# Patient Record
Sex: Female | Born: 1985 | Race: Black or African American | Hispanic: No | Marital: Single | Smoking: Never smoker
Health system: Southern US, Community
[De-identification: ages and names within clinical notes are randomized; demographics above are authoritative.]

## PROBLEM LIST (undated history)

## (undated) ENCOUNTER — Inpatient Hospital Stay (HOSPITAL_COMMUNITY): Payer: Self-pay

## (undated) DIAGNOSIS — F419 Anxiety disorder, unspecified: Secondary | ICD-10-CM

## (undated) DIAGNOSIS — L732 Hidradenitis suppurativa: Secondary | ICD-10-CM

## (undated) DIAGNOSIS — K5010 Crohn's disease of large intestine without complications: Secondary | ICD-10-CM

## (undated) DIAGNOSIS — I10 Essential (primary) hypertension: Secondary | ICD-10-CM

## (undated) DIAGNOSIS — F32A Depression, unspecified: Secondary | ICD-10-CM

## (undated) DIAGNOSIS — Z9119 Patient's noncompliance with other medical treatment and regimen: Secondary | ICD-10-CM

## (undated) DIAGNOSIS — T7840XA Allergy, unspecified, initial encounter: Secondary | ICD-10-CM

## (undated) DIAGNOSIS — G43909 Migraine, unspecified, not intractable, without status migrainosus: Secondary | ICD-10-CM

## (undated) DIAGNOSIS — Z91199 Patient's noncompliance with other medical treatment and regimen due to unspecified reason: Secondary | ICD-10-CM

## (undated) HISTORY — DX: Allergy, unspecified, initial encounter: T78.40XA

## (undated) HISTORY — DX: Anxiety disorder, unspecified: F41.9

## (undated) HISTORY — PX: INDUCED ABORTION: SHX677

## (undated) HISTORY — DX: Depression, unspecified: F32.A

## (undated) HISTORY — PX: NO PAST SURGERIES: SHX2092

## (undated) SURGERY — Surgical Case
Anesthesia: *Unknown

---
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
## 2005-02-05 ENCOUNTER — Emergency Department (HOSPITAL_COMMUNITY): Admission: EM | Admit: 2005-02-05 | Discharge: 2005-02-05 | Payer: Self-pay | Admitting: Emergency Medicine

## 2006-01-09 ENCOUNTER — Ambulatory Visit (HOSPITAL_COMMUNITY): Admission: RE | Admit: 2006-01-09 | Discharge: 2006-01-09 | Payer: Self-pay | Admitting: Family Medicine

## 2006-01-23 ENCOUNTER — Emergency Department (HOSPITAL_COMMUNITY): Admission: EM | Admit: 2006-01-23 | Discharge: 2006-01-23 | Payer: Self-pay | Admitting: Emergency Medicine

## 2006-03-14 ENCOUNTER — Inpatient Hospital Stay (HOSPITAL_COMMUNITY): Admission: EM | Admit: 2006-03-14 | Discharge: 2006-03-15 | Payer: Self-pay | Admitting: Family Medicine

## 2006-03-14 ENCOUNTER — Encounter: Payer: Self-pay | Admitting: Emergency Medicine

## 2006-03-14 ENCOUNTER — Ambulatory Visit: Payer: Self-pay | Admitting: Family Medicine

## 2006-04-05 ENCOUNTER — Emergency Department (HOSPITAL_COMMUNITY): Admission: EM | Admit: 2006-04-05 | Discharge: 2006-04-05 | Payer: Self-pay | Admitting: Emergency Medicine

## 2007-04-22 ENCOUNTER — Emergency Department (HOSPITAL_COMMUNITY): Admission: EM | Admit: 2007-04-22 | Discharge: 2007-04-22 | Payer: Self-pay | Admitting: Emergency Medicine

## 2007-05-04 ENCOUNTER — Emergency Department (HOSPITAL_COMMUNITY): Admission: EM | Admit: 2007-05-04 | Discharge: 2007-05-04 | Payer: Self-pay | Admitting: *Deleted

## 2007-07-14 ENCOUNTER — Encounter: Admission: RE | Admit: 2007-07-14 | Discharge: 2007-07-14 | Payer: Self-pay | Admitting: *Deleted

## 2008-05-08 ENCOUNTER — Emergency Department (HOSPITAL_COMMUNITY): Admission: EM | Admit: 2008-05-08 | Discharge: 2008-05-08 | Payer: Self-pay | Admitting: Emergency Medicine

## 2008-11-08 ENCOUNTER — Emergency Department (HOSPITAL_COMMUNITY): Admission: EM | Admit: 2008-11-08 | Discharge: 2008-11-08 | Payer: Self-pay | Admitting: Emergency Medicine

## 2009-03-20 ENCOUNTER — Emergency Department (HOSPITAL_COMMUNITY): Admission: EM | Admit: 2009-03-20 | Discharge: 2009-03-20 | Payer: Self-pay | Admitting: Emergency Medicine

## 2009-04-14 ENCOUNTER — Emergency Department (HOSPITAL_COMMUNITY): Admission: EM | Admit: 2009-04-14 | Discharge: 2009-04-15 | Payer: Self-pay | Admitting: Emergency Medicine

## 2009-06-05 ENCOUNTER — Emergency Department (HOSPITAL_COMMUNITY): Admission: EM | Admit: 2009-06-05 | Discharge: 2009-06-05 | Payer: Self-pay | Admitting: Emergency Medicine

## 2010-07-25 ENCOUNTER — Emergency Department (HOSPITAL_COMMUNITY): Admission: EM | Admit: 2010-07-25 | Discharge: 2010-07-25 | Payer: Self-pay | Admitting: Emergency Medicine

## 2010-07-26 ENCOUNTER — Emergency Department (HOSPITAL_COMMUNITY): Admission: EM | Admit: 2010-07-26 | Discharge: 2010-07-26 | Payer: Self-pay | Admitting: Emergency Medicine

## 2010-10-08 ENCOUNTER — Emergency Department (HOSPITAL_COMMUNITY): Admission: EM | Admit: 2010-10-08 | Discharge: 2010-10-08 | Payer: Self-pay | Admitting: Emergency Medicine

## 2010-10-09 ENCOUNTER — Emergency Department (HOSPITAL_COMMUNITY): Admission: EM | Admit: 2010-10-09 | Discharge: 2010-10-09 | Payer: Self-pay | Admitting: Emergency Medicine

## 2011-01-17 ENCOUNTER — Emergency Department (HOSPITAL_COMMUNITY)
Admission: EM | Admit: 2011-01-17 | Discharge: 2011-01-17 | Disposition: A | Discharge status: Home / Self Care | Payer: Managed Care, Other (non HMO) | Attending: Emergency Medicine | Admitting: Emergency Medicine

## 2011-01-17 ENCOUNTER — Emergency Department (HOSPITAL_COMMUNITY): Payer: Managed Care, Other (non HMO)

## 2011-01-17 DIAGNOSIS — K5289 Other specified noninfective gastroenteritis and colitis: Secondary | ICD-10-CM | POA: Insufficient documentation

## 2011-01-17 DIAGNOSIS — R109 Unspecified abdominal pain: Secondary | ICD-10-CM | POA: Insufficient documentation

## 2011-01-17 DIAGNOSIS — R112 Nausea with vomiting, unspecified: Secondary | ICD-10-CM | POA: Insufficient documentation

## 2011-01-17 DIAGNOSIS — R110 Nausea: Secondary | ICD-10-CM | POA: Insufficient documentation

## 2011-01-17 DIAGNOSIS — R197 Diarrhea, unspecified: Secondary | ICD-10-CM | POA: Insufficient documentation

## 2011-01-17 DIAGNOSIS — D72829 Elevated white blood cell count, unspecified: Secondary | ICD-10-CM | POA: Insufficient documentation

## 2011-01-17 DIAGNOSIS — R10819 Abdominal tenderness, unspecified site: Secondary | ICD-10-CM | POA: Insufficient documentation

## 2011-01-17 LAB — DIFFERENTIAL
Basophils Absolute: 0 10*3/uL (ref 0.0–0.1)
Lymphocytes Relative: 9 % — ABNORMAL LOW (ref 12–46)
Lymphs Abs: 2 10*3/uL (ref 0.7–4.0)
Neutro Abs: 20.5 10*3/uL — ABNORMAL HIGH (ref 1.7–7.7)

## 2011-01-17 LAB — URINALYSIS, ROUTINE W REFLEX MICROSCOPIC
Hgb urine dipstick: NEGATIVE
Specific Gravity, Urine: 1.011 (ref 1.005–1.030)
Urine Glucose, Fasting: NEGATIVE mg/dL
Urobilinogen, UA: 0.2 mg/dL (ref 0.0–1.0)

## 2011-01-17 LAB — COMPREHENSIVE METABOLIC PANEL
ALT: 16 U/L (ref 0–35)
Alkaline Phosphatase: 68 U/L (ref 39–117)
CO2: 25 mEq/L (ref 19–32)
Chloride: 106 mEq/L (ref 96–112)
GFR calc non Af Amer: >60 mL/min (ref >60)
Glucose, Bld: 139 mg/dL — ABNORMAL HIGH (ref 70–99)
Potassium: 3.4 mEq/L — ABNORMAL LOW (ref 3.5–5.1)
Sodium: 143 mEq/L (ref 135–145)
Total Bilirubin: 0.7 mg/dL (ref 0.3–1.2)

## 2011-01-17 LAB — WET PREP, GENITAL

## 2011-01-17 LAB — CBC
HCT: 44.3 % (ref 36.0–46.0)
Hemoglobin: 14.7 g/dL (ref 12.0–15.0)
WBC: 24.1 10*3/uL — ABNORMAL HIGH (ref 4.0–10.5)

## 2011-01-17 LAB — LIPASE, BLOOD: Lipase: 27 U/L (ref 11–59)

## 2011-01-17 MED ORDER — IOHEXOL 300 MG/ML  SOLN
100.0000 mL | Freq: Once | INTRAMUSCULAR | Status: AC | PRN
  Administered 2011-01-17: 100 mL via INTRAVENOUS

## 2011-01-17 MED ORDER — IOHEXOL 300 MG/ML  SOLN: 100.0000 mL | Freq: Once | INTRAMUSCULAR | Status: DC | PRN

## 2011-01-18 LAB — GC/CHLAMYDIA PROBE AMP, GENITAL
Chlamydia, DNA Probe: NEGATIVE
GC Probe Amp, Genital: NEGATIVE

## 2011-02-11 LAB — URINE MICROSCOPIC-ADD ON

## 2011-02-11 LAB — CBC
HCT: 45 % (ref 36.0–46.0)
Hemoglobin: 15.1 g/dL — ABNORMAL HIGH (ref 12.0–15.0)
MCH: 27.5 pg (ref 26.0–34.0)
MCHC: 33.1 g/dL (ref 30.0–36.0)
MCHC: 33.6 g/dL (ref 30.0–36.0)
MCV: 82.3 fL (ref 78.0–100.0)
Platelets: 289 10*3/uL (ref 150–400)
RDW: 14.9 % (ref 11.5–15.5)

## 2011-02-11 LAB — COMPREHENSIVE METABOLIC PANEL
ALT: 14 U/L (ref 0–35)
AST: 27 U/L (ref 0–37)
Albumin: 4.6 g/dL (ref 3.5–5.2)
Calcium: 9.9 mg/dL (ref 8.4–10.5)
Creatinine, Ser: 0.87 mg/dL (ref 0.4–1.2)
GFR calc Af Amer: >60 mL/min (ref >60)
GFR calc non Af Amer: >60 mL/min (ref >60)
Sodium: 138 mEq/L (ref 135–145)
Total Protein: 9.1 g/dL — ABNORMAL HIGH (ref 6.0–8.3)

## 2011-02-11 LAB — DIFFERENTIAL
Basophils Relative: 0 % (ref 0–1)
Eosinophils Relative: 0 % (ref 0–5)
Eosinophils Relative: 0 % (ref 0–5)
Lymphocytes Relative: 7 % — ABNORMAL LOW (ref 12–46)
Lymphs Abs: 0.9 10*3/uL (ref 0.7–4.0)
Monocytes Absolute: 0.2 10*3/uL (ref 0.1–1.0)
Monocytes Absolute: 0.7 10*3/uL (ref 0.1–1.0)
Monocytes Relative: 2 % — ABNORMAL LOW (ref 3–12)
Monocytes Relative: 6 % (ref 3–12)
Neutro Abs: 9.8 10*3/uL — ABNORMAL HIGH (ref 1.7–7.7)

## 2011-02-11 LAB — URINALYSIS, ROUTINE W REFLEX MICROSCOPIC
Bilirubin Urine: NEGATIVE
Glucose, UA: NEGATIVE mg/dL
Leukocytes, UA: NEGATIVE
Protein, ur: 30 mg/dL — AB
Specific Gravity, Urine: 1.018 (ref 1.005–1.030)
Specific Gravity, Urine: 1.025 (ref 1.005–1.030)
Urobilinogen, UA: 0.2 mg/dL (ref 0.0–1.0)
pH: 6 (ref 5.0–8.0)

## 2011-02-11 LAB — POCT I-STAT, CHEM 8
HCT: 50 % — ABNORMAL HIGH (ref 36.0–46.0)
Hemoglobin: 17 g/dL — ABNORMAL HIGH (ref 12.0–15.0)
Potassium: 3.4 mEq/L — ABNORMAL LOW (ref 3.5–5.1)
Sodium: 137 mEq/L (ref 135–145)

## 2011-02-11 LAB — LIPASE, BLOOD: Lipase: 22 U/L (ref 11–59)

## 2011-02-11 LAB — POCT PREGNANCY, URINE: Preg Test, Ur: NEGATIVE

## 2011-02-14 LAB — DIFFERENTIAL
Basophils Relative: 0 % (ref 0–1)
Lymphocytes Relative: 5 % — ABNORMAL LOW (ref 12–46)
Lymphs Abs: 0.8 10*3/uL (ref 0.7–4.0)
Monocytes Absolute: 0.6 10*3/uL (ref 0.1–1.0)
Monocytes Relative: 3 % (ref 3–12)
Monocytes Relative: 4 % (ref 3–12)
Neutro Abs: 12.3 10*3/uL — ABNORMAL HIGH (ref 1.7–7.7)
Neutro Abs: 13.7 10*3/uL — ABNORMAL HIGH (ref 1.7–7.7)
Neutrophils Relative %: 92 % — ABNORMAL HIGH (ref 43–77)

## 2011-02-14 LAB — CBC
HCT: 48.1 % — ABNORMAL HIGH (ref 36.0–46.0)
Hemoglobin: 14.1 g/dL (ref 12.0–15.0)
Hemoglobin: 16.1 g/dL — ABNORMAL HIGH (ref 12.0–15.0)
MCHC: 33.5 g/dL (ref 30.0–36.0)
RBC: 5.02 MIL/uL (ref 3.87–5.11)

## 2011-02-14 LAB — URINALYSIS, ROUTINE W REFLEX MICROSCOPIC
Leukocytes, UA: NEGATIVE
Nitrite: NEGATIVE
Specific Gravity, Urine: 1.019 (ref 1.005–1.030)
Urobilinogen, UA: 0.2 mg/dL (ref 0.0–1.0)

## 2011-02-14 LAB — BASIC METABOLIC PANEL
CO2: 23 mEq/L (ref 19–32)
Calcium: 8.7 mg/dL (ref 8.4–10.5)
Chloride: 101 mEq/L (ref 96–112)
GFR calc Af Amer: >60 mL/min (ref >60)
GFR calc non Af Amer: >60 mL/min (ref >60)
GFR calc non Af Amer: >60 mL/min (ref >60)
Glucose, Bld: 84 mg/dL (ref 70–99)
Potassium: 3.2 mEq/L — ABNORMAL LOW (ref 3.5–5.1)
Potassium: 3.4 mEq/L — ABNORMAL LOW (ref 3.5–5.1)
Sodium: 136 mEq/L (ref 135–145)
Sodium: 139 mEq/L (ref 135–145)

## 2011-02-14 LAB — URINE MICROSCOPIC-ADD ON

## 2011-03-09 LAB — COMPREHENSIVE METABOLIC PANEL
CO2: 20 mEq/L (ref 19–32)
Calcium: 8.7 mg/dL (ref 8.4–10.5)
Creatinine, Ser: 0.57 mg/dL (ref 0.4–1.2)
GFR calc non Af Amer: >60 mL/min (ref >60)
Glucose, Bld: 106 mg/dL — ABNORMAL HIGH (ref 70–99)
Total Bilirubin: 0.8 mg/dL (ref 0.3–1.2)

## 2011-03-09 LAB — URINALYSIS, ROUTINE W REFLEX MICROSCOPIC
Bilirubin Urine: NEGATIVE
Hgb urine dipstick: NEGATIVE
Specific Gravity, Urine: 1.011 (ref 1.005–1.030)
Urobilinogen, UA: 0.2 mg/dL (ref 0.0–1.0)
pH: 7.5 (ref 5.0–8.0)

## 2011-03-09 LAB — CBC
HCT: 44.5 % (ref 36.0–46.0)
Hemoglobin: 14.9 g/dL (ref 12.0–15.0)
MCHC: 33.5 g/dL (ref 30.0–36.0)
MCV: 82 fL (ref 78.0–100.0)
RBC: 5.43 MIL/uL — ABNORMAL HIGH (ref 3.87–5.11)

## 2011-03-09 LAB — DIFFERENTIAL
Basophils Relative: 3 % — ABNORMAL HIGH (ref 0–1)
Eosinophils Relative: 0 % (ref 0–5)
Lymphs Abs: 1 10*3/uL (ref 0.7–4.0)
Monocytes Absolute: 0.5 10*3/uL (ref 0.1–1.0)
Neutro Abs: 14.7 10*3/uL — ABNORMAL HIGH (ref 1.7–7.7)

## 2011-03-09 LAB — PREGNANCY, URINE: Preg Test, Ur: NEGATIVE

## 2011-03-11 LAB — DIFFERENTIAL
Basophils Absolute: 0 10*3/uL (ref 0.0–0.1)
Basophils Relative: 0 % (ref 0–1)
Eosinophils Absolute: 0 10*3/uL (ref 0.0–0.7)
Eosinophils Relative: 0 % (ref 0–5)
Monocytes Absolute: 0.2 10*3/uL (ref 0.1–1.0)
Monocytes Relative: 1 % — ABNORMAL LOW (ref 3–12)
Neutro Abs: 16.2 10*3/uL — ABNORMAL HIGH (ref 1.7–7.7)

## 2011-03-11 LAB — BASIC METABOLIC PANEL
CO2: 22 mEq/L (ref 19–32)
Calcium: 9.4 mg/dL (ref 8.4–10.5)
Chloride: 104 mEq/L (ref 96–112)
Glucose, Bld: 92 mg/dL (ref 70–99)
Sodium: 137 mEq/L (ref 135–145)

## 2011-03-11 LAB — URINALYSIS, ROUTINE W REFLEX MICROSCOPIC
Bilirubin Urine: NEGATIVE
Nitrite: NEGATIVE
Protein, ur: NEGATIVE mg/dL
Specific Gravity, Urine: 1.018 (ref 1.005–1.030)
Urobilinogen, UA: 0.2 mg/dL (ref 0.0–1.0)

## 2011-03-11 LAB — CBC
HCT: 44.4 % (ref 36.0–46.0)
Hemoglobin: 14.6 g/dL (ref 12.0–15.0)
MCHC: 33 g/dL (ref 30.0–36.0)
MCV: 83 fL (ref 78.0–100.0)
RBC: 5.35 MIL/uL — ABNORMAL HIGH (ref 3.87–5.11)
RDW: 15.3 % (ref 11.5–15.5)

## 2011-03-11 LAB — URINE MICROSCOPIC-ADD ON

## 2011-03-12 LAB — POCT I-STAT, CHEM 8
Calcium, Ion: 0.97 mmol/L — ABNORMAL LOW (ref 1.12–1.32)
Glucose, Bld: 129 mg/dL — ABNORMAL HIGH (ref 70–99)
HCT: 50 % — ABNORMAL HIGH (ref 36.0–46.0)
Hemoglobin: 17 g/dL — ABNORMAL HIGH (ref 12.0–15.0)
TCO2: 21 mmol/L (ref 0–100)

## 2011-04-18 NOTE — H&P (Signed)
NAME:  AVALEEN, BROWNLEY              ACCOUNT NO.:  [id number]   MEDICAL RECORD NO.:  [id number]          PATIENT TYPE:  INP   LOCATION:  5511                         FACILITY:  [facility]   PHYSICIAN:  Clifton Custard, M.D.      DATE OF BIRTH:  [birth year]   DATE OF ADMISSION:  03/14/2006  DATE OF DISCHARGE:                                HISTORY & PHYSICAL   CHIEF COMPLAINT:  Nausea and vomiting.   HISTORY OF PRESENT ILLNESS:  The patient is a 25 year old African American  female with no significant past medical history who has had 4-5 days of  nausea and vomiting.  The vomiting has been non-bilious and non-bloody.  The  patient states she has been unable to keep any food or fluids down for the  past 4-5 days.  She denies any abdominal pain or diarrhea.  Her last bowel  movement was March 12, 2006.  She has noted subjective fevers and chills  during the past week, as well.  Of note, the patient underwent an abortion  on March 11, 2006.  The procedure was without complications.  She was  started on Doxycycline 100 mg p.o. once daily for prophylaxis.  The patient  was having symptoms of  nausea and vomiting prior to her abortion.  The  patient had some dysuria at the beginning of her illness, although she does  not have any dysuria now.  She denies any flank pain.  She denies any  vaginal discharge.  The patient does complain of some neck pain.  She  describes the pain as achy, on the back of her neck, and sometimes shooting.  She denies any upper extremity weakness or sensory changes.  She denies any  headache, vision changes, or mental status changes.  The patient has had  similar pains in her neck before in February of this year when she was sick  with an infectious colitis.   REVIEW OF SYSTEMS:  No chest pain, no shortness of breath, no palpitations.  Positive light headedness.  No mental status changes.  No rashes.  No  vaginal discharge.  The patient does note some esophageal burning  after  vomiting.  Patient also with a small amount of vaginal bleeding similar to  menses.  The remainder of the review of systems as per HPI.   PAST MEDICAL HISTORY:  Infectious colitis in February 2007.  She had a CT  scan which showed inflammation of the terminal ileum.  Colonoscopy was  performed which was negative for Crohn's.  The patient was treated with  Cipro and Flagyl.   MEDICATIONS:  No regular medications, although currently taking Doxycycline  100 mg once daily.   ALLERGIES:  CIPRO, FLAGYL, AND HYDROCODONE.  The patient developed a rash  and swelling while on all three of these medications for treatment of her  colitis.   FAMILY HISTORY:  Mother with type 2 diabetes.  No family  history of Crohn's  other than inflammatory bowel disease.   SOCIAL HISTORY:  The patient attends [employer].  She denies any alcohol  or drug use.  She  does smoke occasional cigarettes.   PHYSICAL EXAMINATION:  VITAL SIGNS:  Temperature 98.2, although temperature was recorded as 101 at  [facility], pulse 76, blood pressure 118/72, respiratory rate 16,  100% on room air.  GENERAL:  Alert in no acute distress.  HEENT:  Mucous membranes are dry.  Throat without erythema or exudate.  NECK:  No lymphadenopathy, no thyromegaly or thyroid nodules.  Neck with  full range of motion.  The patient is tender to palpation at the base of her  occiput bilaterally.  CARDIOVASCULAR:  Regular rate and rhythm.  LUNGS:  Clear to auscultation bilaterally.  ABDOMEN:  Positive bowel sounds, soft, nontender, nondistended, no  hepatosplenomegaly, no CVA tenderness, no rebound or guarding, no Murphy's  sign.  EXTREMITIES:  No cyanosis, clubbing, and edema.  NEUROLOGICAL:  Cranial nerves 2-12 are grossly intact, sensation intact to  light touch.  Patient with 5/5 upper extremity and lower extremity strength  bilaterally symmetric.  Patient with 2+ DTRs and bilaterally symmetric.  Finger-to-nose  intact.  Patient with negative Kernig's or Brudzinski sign.   LABORATORY DATA:  Sodium 135, potassium 2.6, chloride 101, bicarb 23, BUN 8,  creatinine 0.7, glucose 105.  Total bilirubin 1, alkaline phos 43, AST 23,  ALT 30, albumin 3.6, calcium 8.9.  White blood cell count 16.4, hemoglobin  14, hematocrit 42, platelet count 352, 78% neutrophils, 12.8 absolute  neutrophils.  Urinalysis with specific gravity 1.029, negative ketones,  large amount of blood, 30 protein, negative nitrate, trace leukocyte  esterase.  Urine microscopy with 0-2 white blood cells, 21-50 red blood  cells.  Chest x-ray showed no active cardiopulmonary disease.   ASSESSMENT/PLAN:  25 year old female with nausea, vomiting, and electrolyte  abnormalities.   1.  Nausea and vomiting.  Differential diagnosis includes viral      gastroenteritis, pregnancy, versus hyperthyroidism, or some kind of      inflammatory condition versus possible pelvic abscess as the patient      just had an abortion.  Urinalysis was negative for pyelonephritis,      thought less likely.  The patient did have a fever and elevated white      blood cell count.  We will continue to provide supportive care with IV      fluids, Protonix and Zofran.  We will check a TSH, urine pregnancy test,      ESR to rule out inflammatory process.  We will also do a bimanual exam      and check a pelvic ultrasound to rule out a pelvic abscess.  We will      also draw blood cultures.  We will hold off on starting antibiotics for      now.  2.  Hyperkalemia.  Her potassium was 2.6 likely secondary to vomiting and      dehydration.  We will replace potassium orally as well as IV if      necessary.  3.  Neck pain.  The patient's exam was consistent with a musculoskeletal      source of pain.  Patient with no headache, good neck range of motion, no      mental status changes, is not concerning for meningitis.  I will provide      symptomatic treatment with Toradol  and Ultram. 4.  Fluids and electrolytes.  The patient is dehydrated.  I will run normal      saline at 200 mL an hour.  I will also check a BMP in the  p.m. to follow      electrolytes.  Will advance the patient's diet as tolerated.      Clifton Custard, M.D.    ______________________________  Clifton Custard, M.D.    MR/MEDQ  D:  03/14/2006  T:  03/14/2006  Job:  [id number]   cc:   Bernerd Limbo, M.D.  Fax: [phone]

## 2011-04-18 NOTE — Discharge Summary (Signed)
NAME:  Frances Mccormick, Frances Mccormick              ACCOUNT NO.:  [id number]   MEDICAL RECORD NO.:  [id number]          PATIENT TYPE:  INP   LOCATION:  5511                         FACILITY:  [facility]   PHYSICIAN:  Randa Spike, MD  DATE OF BIRTH:  1986-10-30   DATE OF ADMISSION:  03/14/2006  DATE OF DISCHARGE:  03/15/2006                                 DISCHARGE SUMMARY   DISCHARGE DIAGNOSES:  1.  Nausea and vomiting, resolved.  2.  Dehydration, resolved.  3.  Gastritis.   DISCHARGE MEDICATIONS:  1.  Prilosec 40 mg p.o. b.i.d. for 7 days.  2.  Phenergan 12.5 mg q.6 h p.r.n. for nausea.  3.  Tylenol #3, 2 tabs q.4 h p.r.n. for pain.   DISCHARGE INSTRUCTIONS:  The patient was instructed to continue with a full  liquid diet and advance her diet as tolerated. Also was recommended to  followup at [facility] in 1 or 2 days.   ISSUES TO FOLLOW UP:  The patient had a beta HCG quantitative 3762 on April  14. Consider repeat at 72 hours to assess for increased stabilization or  decrease.   CONSULTATIONS:  None.   PROCEDURE:  The patient had a chest x-ray on April 14 which showed no active  cardiopulmonary disease. The patient had a pelvic ultrasound on April 14  that showed no evidence for retained products of conception. It also was  discussed with radiologist who reports no evidence of ectopic pregnancy.  Ultrasound was discussed with the radiologist down in the radiology office.  Formal report is still pending.   HOSPITAL COURSE:  A 25 year old African-American female with past medical  history significant for recent pregnancy and dilation and curettage on April  11 for a scheduled abortion that was admitted at [facility] on April 14  complaining of 1 week history of nausea and vomiting, feeling weak and was  admitted with dehydration. The patient was also hypokalemic and complaining  of neck discomfort and subjective fevers. The patient also had a past  history in February 2007 of  infectious colitis and had a colonoscopy that  was negative for Crohn's disease. She denied any episode of diarrhea or  constipation during this last admission. The patient also denied abdominal  pain. On admission, the patient was found afebrile, she had a white blood  cell count in 16, had ketones in her urine with an increased specific  gravity of 1029 and potassium was 2.6. She was admitted and treated with IV  fluids, normal saline 200 mL/hour and her potassium was repleted. The  patient was also treated with Protonix 40 mg b.i.d. After 24 hours of  admission, the patient's nausea and vomiting had completely resolved. She  denied abdominal pain, she was tolerating fluids and just presented  __________ epigastrium when eating solids which she described as a burning  sensation. She also had minimal vaginal spotting but no fever or chills. Her  neck discomfort had also resolved.  #1.  NAUSEA AND VOMITING COMPLETELY RESOLVED. Likely this was initially  related to pregnancy and then worsened by gastritis as the patient was  not  keeping fluids or any food down for over 5 days. She responded well to IV  fluid therapy and was able to tolerate a full liquid diet p.o. and some  solids prior to discharge. Though the patient had the symptoms prior her  abortion, her symptoms may have increased secondary to uterine or cervical  manipulation. She had a normal TSH and also had a normal sedimentation rate,  ruled out inflammatory bowel disease.  #2.  DEHYDRATION, RESOLVED.  The patient had a great urinary output. It was  about 140 mL/hour in the last 8 hours prior to her admission. She had a  positive balance of 3.3 liters during the 24 hours prior to her discharge.  Her blood pressure remained stable and the patient was not tachycardic.  #3.  GASTRITIS.  Likely triggered by recent frequent ingestion of NSAIDs.  After she had her abortion, the patient was taking diclofenac fairly  frequent. I  recommended and write a prescription for her to continue with  PPI twice a day for the next 7 days. She is going to followup at [facility] in a day or two. If her symptoms persist, they might consider  continuing PPI therapy and maybe check H. pylori. I recommend the patient to  stop the diclofenac and prescribed Tylenol #3 in case she presents pain.  #4.  HYPOKALEMIA, RESOLVED.  The patient's potassium on discharge was 4.1  Likely this was secondary to emesis. Increased beta HCG likely secondary to  recent pregnancy. There is no retained products in the uterus or evidence of  tubo-ovarian pregnancy by ultrasound. Recommend recheck beta HCG on Monday  if concern.  #5.  INCREASED WHITE BLOOD CELL COUNT. The patient presented with a white  blood cell count of about 16. Her discharge white blood cell count was 10.1.  I think this was slightly secondary to hemoconcentration. Her hemoglobin  also was 14 and her discharge hemoglobin was 11.7. She was not having  abdominal pain and was afebrile during the hospital stay. She presented  minimal vaginal spotting. I am not concerned for ectopic pregnancy or tubo-  ovarian abscess. Also the patient had a bimanual exam that did not show any  tenderness or cervical mobilization. Also her adnexa were not palpable and  her uterus was not tender.   DISCHARGE LABS:  White blood cell count 101., hemoglobin 11.7, hematocrit  34.2, platelets 283. Sodium 136, potassium 4.1, chloride 106. CO2 21, BUN 5,  creatinine 0.7. Glucose 72, calcium 8.4, AST 29, ALT 18. Blood cultures were  no growth today x2.   DISPOSITION:  The patient was discharged home with instructions to followup  at [facility] in 1 or 2 days.      Randa Spike, MD     AM/MEDQ  D:  03/15/2006  T:  03/16/2006  Job:  [id number]   cc:   Bernerd Limbo, M.D.  Fax: [phone]   Dr. __________, [facility]

## 2011-04-20 ENCOUNTER — Emergency Department (HOSPITAL_COMMUNITY)
Admission: EM | Admit: 2011-04-20 | Discharge: 2011-04-20 | Disposition: A | Discharge status: Home / Self Care | Payer: Managed Care, Other (non HMO) | Attending: Emergency Medicine | Admitting: Emergency Medicine

## 2011-04-20 ENCOUNTER — Emergency Department (HOSPITAL_COMMUNITY): Payer: Managed Care, Other (non HMO)

## 2011-04-20 DIAGNOSIS — R1031 Right lower quadrant pain: Secondary | ICD-10-CM | POA: Insufficient documentation

## 2011-04-20 DIAGNOSIS — K5190 Ulcerative colitis, unspecified, without complications: Secondary | ICD-10-CM | POA: Insufficient documentation

## 2011-04-20 LAB — CBC
HCT: 43.2 % (ref 36.0–46.0)
Hemoglobin: 14.6 g/dL (ref 12.0–15.0)
MCH: 26.7 pg (ref 26.0–34.0)
MCHC: 33.8 g/dL (ref 30.0–36.0)
MCV: 79 fL (ref 78.0–100.0)
Platelets: 226 10*3/uL (ref 150–400)
RBC: 5.47 MIL/uL — ABNORMAL HIGH (ref 3.87–5.11)
RDW: 14.4 % (ref 11.5–15.5)
WBC: 17.5 10*3/uL — ABNORMAL HIGH (ref 4.0–10.5)

## 2011-04-20 LAB — URINALYSIS, ROUTINE W REFLEX MICROSCOPIC
Bilirubin Urine: NEGATIVE
Glucose, UA: NEGATIVE mg/dL
Ketones, ur: NEGATIVE mg/dL
Leukocytes, UA: NEGATIVE
Nitrite: NEGATIVE
Protein, ur: NEGATIVE mg/dL
Specific Gravity, Urine: 1.013 (ref 1.005–1.030)
Urobilinogen, UA: 0.2 mg/dL (ref 0.0–1.0)
pH: 7 (ref 5.0–8.0)

## 2011-04-20 LAB — DIFFERENTIAL
Basophils Absolute: 0 K/uL (ref 0.0–0.1)
Basophils Relative: 0 % (ref 0–1)
Eosinophils Absolute: 0 10*3/uL (ref 0.0–0.7)
Eosinophils Relative: 0 % (ref 0–5)
Lymphocytes Relative: 8 % — ABNORMAL LOW (ref 12–46)
Lymphs Abs: 1.4 10*3/uL (ref 0.7–4.0)
Monocytes Absolute: 1.3 K/uL — ABNORMAL HIGH (ref 0.1–1.0)
Monocytes Relative: 7 % (ref 3–12)
Neutro Abs: 14.7 K/uL — ABNORMAL HIGH (ref 1.7–7.7)
Neutrophils Relative %: 84 % — ABNORMAL HIGH (ref 43–77)

## 2011-04-20 LAB — URINE MICROSCOPIC-ADD ON

## 2011-04-20 LAB — COMPREHENSIVE METABOLIC PANEL WITH GFR
ALT: 11 U/L (ref 0–35)
AST: 22 U/L (ref 0–37)
Albumin: 4.5 g/dL (ref 3.5–5.2)
Alkaline Phosphatase: 61 U/L (ref 39–117)
BUN: 8 mg/dL (ref 6–23)
Chloride: 101 meq/L (ref 96–112)
GFR calc Af Amer: >60 mL/min (ref >60)
Potassium: 3.4 meq/L — ABNORMAL LOW (ref 3.5–5.1)
Sodium: 137 meq/L (ref 135–145)
Total Bilirubin: 0.2 mg/dL — ABNORMAL LOW (ref 0.3–1.2)
Total Protein: 7.7 g/dL (ref 6.0–8.3)

## 2011-04-20 LAB — PREGNANCY, URINE: Preg Test, Ur: NEGATIVE

## 2011-04-20 LAB — COMPREHENSIVE METABOLIC PANEL
CO2: 25 mEq/L (ref 19–32)
Calcium: 9.2 mg/dL (ref 8.4–10.5)
Creatinine, Ser: 0.64 mg/dL (ref 0.4–1.2)
GFR calc non Af Amer: >60 mL/min (ref >60)
Glucose, Bld: 112 mg/dL — ABNORMAL HIGH (ref 70–99)

## 2011-04-20 MED ORDER — IOHEXOL 300 MG/ML  SOLN
125.0000 mL | Freq: Once | INTRAMUSCULAR | Status: AC | PRN
  Administered 2011-04-20: 125 mL via INTRAVENOUS

## 2011-07-28 ENCOUNTER — Emergency Department (HOSPITAL_COMMUNITY)
Admission: EM | Admit: 2011-07-28 | Discharge: 2011-07-29 | Disposition: A | Discharge status: Home / Self Care | Payer: Managed Care, Other (non HMO) | Attending: Emergency Medicine | Admitting: Emergency Medicine

## 2011-07-28 DIAGNOSIS — R112 Nausea with vomiting, unspecified: Secondary | ICD-10-CM | POA: Insufficient documentation

## 2011-07-28 DIAGNOSIS — R197 Diarrhea, unspecified: Secondary | ICD-10-CM | POA: Insufficient documentation

## 2011-07-28 DIAGNOSIS — Z8719 Personal history of other diseases of the digestive system: Secondary | ICD-10-CM | POA: Insufficient documentation

## 2011-07-28 DIAGNOSIS — R109 Unspecified abdominal pain: Secondary | ICD-10-CM | POA: Insufficient documentation

## 2011-07-29 LAB — POCT I-STAT, CHEM 8
BUN: 4 mg/dL — ABNORMAL LOW (ref 6–23)
Chloride: 103 mEq/L (ref 96–112)
HCT: 51 % — ABNORMAL HIGH (ref 36.0–46.0)
Sodium: 139 mEq/L (ref 135–145)
TCO2: 22 mmol/L (ref 0–100)

## 2011-07-29 LAB — POCT PREGNANCY, URINE: Preg Test, Ur: NEGATIVE

## 2011-07-29 LAB — URINALYSIS, ROUTINE W REFLEX MICROSCOPIC
Ketones, ur: 40 mg/dL — AB
Leukocytes, UA: NEGATIVE
Nitrite: NEGATIVE
Specific Gravity, Urine: 1.014 (ref 1.005–1.030)
Urobilinogen, UA: 0.2 mg/dL (ref 0.0–1.0)
pH: 8 (ref 5.0–8.0)

## 2011-07-30 ENCOUNTER — Emergency Department (HOSPITAL_COMMUNITY): Payer: Managed Care, Other (non HMO)

## 2011-07-30 ENCOUNTER — Encounter (HOSPITAL_COMMUNITY): Payer: Self-pay | Admitting: Radiology

## 2011-07-30 ENCOUNTER — Inpatient Hospital Stay (HOSPITAL_COMMUNITY)
Admission: EM | Admit: 2011-07-30 | Discharge: 2011-08-02 | DRG: 387 | Disposition: A | Discharge status: Home / Self Care | Payer: Managed Care, Other (non HMO) | Attending: Internal Medicine | Admitting: Internal Medicine

## 2011-07-30 DIAGNOSIS — F17200 Nicotine dependence, unspecified, uncomplicated: Secondary | ICD-10-CM | POA: Diagnosis present

## 2011-07-30 DIAGNOSIS — Z8719 Personal history of other diseases of the digestive system: Secondary | ICD-10-CM

## 2011-07-30 DIAGNOSIS — E139 Other specified diabetes mellitus without complications: Secondary | ICD-10-CM | POA: Diagnosis not present

## 2011-07-30 DIAGNOSIS — E860 Dehydration: Secondary | ICD-10-CM | POA: Diagnosis present

## 2011-07-30 DIAGNOSIS — T380X5A Adverse effect of glucocorticoids and synthetic analogues, initial encounter: Secondary | ICD-10-CM | POA: Diagnosis not present

## 2011-07-30 DIAGNOSIS — E876 Hypokalemia: Secondary | ICD-10-CM | POA: Diagnosis present

## 2011-07-30 DIAGNOSIS — K5000 Crohn's disease of small intestine without complications: Principal | ICD-10-CM | POA: Diagnosis present

## 2011-07-30 LAB — URINALYSIS, ROUTINE W REFLEX MICROSCOPIC
Ketones, ur: 40 mg/dL — AB
Protein, ur: NEGATIVE mg/dL
Urobilinogen, UA: 0.2 mg/dL (ref 0.0–1.0)

## 2011-07-30 LAB — COMPREHENSIVE METABOLIC PANEL
ALT: 14 U/L (ref 0–35)
BUN: 9 mg/dL (ref 6–23)
CO2: 23 mEq/L (ref 19–32)
Calcium: 8.9 mg/dL (ref 8.4–10.5)
Creatinine, Ser: 0.61 mg/dL (ref 0.50–1.10)
GFR calc Af Amer: >60 mL/min (ref >60)
GFR calc non Af Amer: >60 mL/min (ref >60)
Glucose, Bld: 96 mg/dL (ref 70–99)
Total Protein: 8.2 g/dL (ref 6.0–8.3)

## 2011-07-30 LAB — DIFFERENTIAL
Basophils Relative: 0 % (ref 0–1)
Lymphocytes Relative: 6 % — ABNORMAL LOW (ref 12–46)
Lymphs Abs: 1 10*3/uL (ref 0.7–4.0)
Monocytes Absolute: 0.9 10*3/uL (ref 0.1–1.0)
Monocytes Relative: 5 % (ref 3–12)
Neutro Abs: 14.4 10*3/uL — ABNORMAL HIGH (ref 1.7–7.7)
Neutrophils Relative %: 88 % — ABNORMAL HIGH (ref 43–77)

## 2011-07-30 LAB — CBC
HCT: 44 % (ref 36.0–46.0)
Hemoglobin: 15.1 g/dL — ABNORMAL HIGH (ref 12.0–15.0)
MCH: 27.2 pg (ref 26.0–34.0)
MCHC: 34.3 g/dL (ref 30.0–36.0)
MCV: 79.1 fL (ref 78.0–100.0)
RBC: 5.56 MIL/uL — ABNORMAL HIGH (ref 3.87–5.11)

## 2011-07-30 LAB — CARDIAC PANEL(CRET KIN+CKTOT+MB+TROPI)
CK, MB: 2.5 ng/mL (ref 0.3–4.0)
Troponin I: <0.3 ng/mL (ref <0.30)

## 2011-07-30 LAB — LIPASE, BLOOD: Lipase: 18 U/L (ref 11–59)

## 2011-07-30 LAB — URINE MICROSCOPIC-ADD ON

## 2011-07-30 MED ORDER — IOHEXOL 300 MG/ML  SOLN
80.0000 mL | Freq: Once | INTRAMUSCULAR | Status: AC | PRN
  Administered 2011-07-30: 80 mL via INTRAVENOUS

## 2011-07-30 MED ORDER — IOHEXOL 300 MG/ML  SOLN
100.0000 mL | Freq: Once | INTRAMUSCULAR | Status: AC | PRN
  Administered 2011-07-30: 100 mL via INTRAVENOUS

## 2011-07-31 ENCOUNTER — Other Ambulatory Visit: Payer: Self-pay | Admitting: Gastroenterology

## 2011-07-31 LAB — COMPREHENSIVE METABOLIC PANEL
ALT: 10 U/L (ref 0–35)
AST: 15 U/L (ref 0–37)
Albumin: 3.4 g/dL — ABNORMAL LOW (ref 3.5–5.2)
Calcium: 8.7 mg/dL (ref 8.4–10.5)
Creatinine, Ser: 0.61 mg/dL (ref 0.50–1.10)
Sodium: 138 mEq/L (ref 135–145)
Total Protein: 6.6 g/dL (ref 6.0–8.3)

## 2011-07-31 LAB — CBC
Hemoglobin: 12.9 g/dL (ref 12.0–15.0)
Platelets: 217 10*3/uL (ref 150–400)
RBC: 4.73 MIL/uL (ref 3.87–5.11)
WBC: 10.9 10*3/uL — ABNORMAL HIGH (ref 4.0–10.5)

## 2011-07-31 LAB — CARDIAC PANEL(CRET KIN+CKTOT+MB+TROPI)
CK, MB: 2.6 ng/mL (ref 0.3–4.0)
Relative Index: 2.4 (ref 0.0–2.5)
Relative Index: INVALID (ref 0.0–2.5)
Total CK: 110 U/L (ref 7–177)
Total CK: 78 U/L (ref 7–177)
Troponin I: <0.3 ng/mL (ref <0.30)

## 2011-07-31 LAB — DIFFERENTIAL
Basophils Relative: 0 % (ref 0–1)
Eosinophils Absolute: 0 10*3/uL (ref 0.0–0.7)
Lymphs Abs: 0.9 10*3/uL (ref 0.7–4.0)
Monocytes Relative: 1 % — ABNORMAL LOW (ref 3–12)
Neutro Abs: 9.8 10*3/uL — ABNORMAL HIGH (ref 1.7–7.7)
Neutrophils Relative %: 91 % — ABNORMAL HIGH (ref 43–77)

## 2011-07-31 LAB — GLUCOSE, CAPILLARY: Glucose-Capillary: 120 mg/dL — ABNORMAL HIGH (ref 70–99)

## 2011-07-31 NOTE — H&P (Signed)
Frances Mccormick, GUARDIOLA              ACCOUNT NO.:  [id number]  MEDICAL RECORD NO.:  [id number]  LOCATION:  WLED                         FACILITY:  [facility]  PHYSICIAN:  Talmage Nap, MD  DATE OF BIRTH:  [birth year]  DATE OF ADMISSION:  07/30/2011 DATE OF DISCHARGE:                             HISTORY & PHYSICAL   PRIMARY CARE PHYSICIAN:  None.  PRIMARY GASTROENTEROLOGIST:  Frances Mccormick. [name], MD  History obtainable from the patient and the patient's mother.  CHIEF COMPLAINT:  Abdominal pain with diarrhea and vomiting for about 3 days duration.  HISTORY OF PRESENT ILLNESS:  The patient is a 25 year old African American female with history of ulcerative colitis diagnosed 2 years ago, but has not had any follow up with the GI physician due to patient noncompliant, presenting to the emergency room with 3-day history of abdominal pain with associated diarrhea and vomiting.  The patient claimed that she had been in stable health until about 3 days prior to this admission when she developed abdominal pain located mainly in the epigastric region.  She described the pain as colicky and sometimes radiating to the back with associated diarrhea and vomiting.  The patient claims she had multiple episodes of nonbloody diarrhea and also vomiting.  Initially, the vomitus contained ingested food, but with subsequent episode, it was clear liquid with occasional bark.  She also had fever, but she denied any history of chills.  She denied any rigor. She denied any dysuria or hematuria.  The patient initially presented to the emergency room on July 28, 2011, and sent home on promethazine and Percocet.  However, her symptoms persisted hence she presented for the 2nd time for further evaluation.  PAST MEDICAL HISTORY:  Positive for ulcerative colitis.  PAST SURGICAL HISTORY:  Colonoscopy.  MEDICATIONS:  Her preadmission medications include Percocet 5/325 mg, 1 to 2 tablets q.4 p.r.n. and  promethazine 25 mg p.o. q.6 p.r.n.  ALLERGIES:  Vicodin and Cipro.  SOCIAL HISTORY:  Positive for infrequent tobacco use.  Denies any history of alcohol use.  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  FAMILY HISTORY:  York Spaniel to be positive for diabetes mellitus and breast CA.  REVIEW OF SYSTEMS:  The patient denies any history of headaches.  No blurry vision.  No nausea or vomiting.  Presently, denies any fever.  No chills.  No rigor.  Complains of pain that is retrosternal with associated shortness of breath.  She denies any PND or orthopnea.  Also complained of pain in the epigastric region with associated multiple episodes of diarrhea and vomiting.  She denies any dysuria or hematuria. No swelling of the lower extremity, and no intolerance to heat or cold, and no neuropsychiatric disorder.  PHYSICAL EXAMINATION:  GENERAL:  Young lady severely, dehydrated, not in any obvious respiratory distress at present. VITAL SIGNS:  Blood pressure is 109/70, pulse is 61, respiratory rate is 15, temperature is 98.9. HEENT:  No pallor.  Pupils are reactive to light.  Extraocular muscles are intact. NECK:  No jugular venous distention.  No carotid bruit.  No lymphadenopathy. CHEST:  Clear to auscultation. HEART:  S1 and S2. ABDOMEN:  Soft with mild tenderness in  the epigastric region.  No guarding.  No rigidity.  Liver, spleen, kidneys not palpable.  Bowel sounds are positive. EXTREMITIES:  No pedal edema. NEUROLOGIC:  Nonfocal. MUSCULOSKELETAL:  Unremarkable. SKIN:  Decreased turgor.  LABORATORY DATA:  Lipase 18.  Chemistry shows sodium of 136, potassium of 3.8, chloride of 100 with a bicarb of 23, glucose is 96, BUN is 9, creatinine is 0.61.  LFTs are normal.  Hematological indices showed WBC of 16.4, hemoglobin of 15.1, hematocrit of 44.0, MCV of 79.2, platelet count of 256, neutrophils 88% and absolute granulocyte count is 14.4. Urine microscopy showed urine  RBCs 3-6 with few bacteria and urine microscopy was unremarkable.  Pregnancy test negative.  IMAGING STUDIES:  CT of the abdomen and pelvis with contrast, which showed multiple areas of colitis, which may be related to her inflammatory bowel disease, infectious,entero-colitis possibly represents similar picture.  There is mild small bowel prominence of the upper abdomen, which may represent secondary adynamic ileus, and this is may likely physiologic.  IMPRESSION: 1. Abdominal pain, diarrhea, and vomiting, questionable ulcerative     colitis flare up. 2. Chest pain, rule out pulmonary embolism.It is of note that pulmonary     embolus is common in the patient with inflammatory bowel disease. 3. Dehydration. 4. Leukocytosis.  PLAN:  Admit the patient to general medical floor.  The patient will be adequately rehydrated with normal saline IV to at a rate of 100 mL an hour.  She will be on Zofran 4 mg IV q.4 p.r.n. for nausea and vomiting as well as Phenergan 6.25 mg IV q.4 p.m. for nausea and vomiting.  She will be started on Flagyl 500 mg IV q.8 hourly and Solu- Medrol 80 mg IV q.8 hourly.  Since the patient is on steroids, she should be on Accu-Cheks t.i.d. with a.c. and h.s. with regular insulin sliding scale.  She also will be on Asacol 800 mg p.o. q.i.d.  Pain control will be done with Dilantin 1 mg IV q.4 p.r.n.  GI prophylaxis with Protonix 40 mg IV q.24 and DVT prophylaxis with Lovenox 40 mg subcutaneous q. 24.  Further workup to be done on this patient will include blood culture x2 before starting IV antibiotics.  Cardiac enzymes q.6 x3.  CBC, CMP, and magnesium will be repeated in a.m. Imaging study to be ordered will include CT thorax with PE protocol. She will be followed and evaluated on day-to-day basis     Talmage Nap, MD     CN/MEDQ  D:  07/30/2011  T:  07/30/2011  Job:  [id number]  Electronically Signed by Talmage Nap  on 07/31/2011 11:18:04 AM

## 2011-08-01 LAB — GLUCOSE, CAPILLARY: Glucose-Capillary: 111 mg/dL — ABNORMAL HIGH (ref 70–99)

## 2011-08-01 LAB — BASIC METABOLIC PANEL
CO2: 23 mEq/L (ref 19–32)
Chloride: 110 mEq/L (ref 96–112)
GFR calc Af Amer: >60 mL/min (ref >60)
Potassium: 3.5 mEq/L (ref 3.5–5.1)

## 2011-08-01 LAB — CBC
Hemoglobin: 12.2 g/dL (ref 12.0–15.0)
MCHC: 33.9 g/dL (ref 30.0–36.0)
RBC: 4.57 MIL/uL (ref 3.87–5.11)
WBC: 14.2 10*3/uL — ABNORMAL HIGH (ref 4.0–10.5)

## 2011-08-05 NOTE — Discharge Summary (Signed)
Frances Mccormick, Frances Mccormick              ACCOUNT NO.:  [id number]  MEDICAL RECORD NO.:  [id number]  LOCATION:  1519                         FACILITY:  [facility]  PHYSICIAN:  Hillery Aldo, M.D.   DATE OF BIRTH:  [birth year]  DATE OF ADMISSION:  07/30/2011 DATE OF DISCHARGE:  08/02/2011                              DISCHARGE SUMMARY   PRIMARY CARE PHYSICIAN:  None.  PRIMARY GASTROENTEROLOGIST:  Jordan Hawks. [name], MD  DISCHARGE DIAGNOSES: 1. Crohn's ileitis. 2. Hypokalemia. 3. Mild steroid-induced hyperglycemia. 4. Tobacco abuse.  DISCHARGE MEDICATIONS: 1. Lialda 1.2 g 4 tablets p.o. daily. 2. Prednisone 40 mg p.o. daily x2 weeks, then 20 mg p.o. daily x2     weeks. 3. Reglan 10 mg p.o. q.6 h p.r.n. nausea. 4. Percocet 5/325 1 tablet p.o. q.4 h p.r.n. pain. 5. Phenergan 25 mg p.o. q.6 h p.r.n. nausea.  CONSULTATIONS:  Dr. Jeani Hawking of gastroenterology.  BRIEF ADMISSION HPI:  The patient is a 25 year old female with a past medical history of colitis who presented to the hospital with the chief complaint of abdominal pain, diarrhea stools, and nausea with vomiting. Upon initial evaluation in the emergency department, imaging showed recurrent colitis and she subsequently was referred to the hospitalist service for further evaluation and treatment.  For the full details, please see the dictated report done by Dr. Beverly Gust.  PROCEDURES/DIAGNOSTIC STUDIES: 1. CT of the abdomen and pelvis on July 30, 2011, showed multifocal     colitis, likely related to inflammatory bowel disease.  Mild small-     bowel prominence in the upper abdomen, favored to represent     secondary adynamic ileus.  Small volume cul-de-sac fluid, likely     physiologic. 2. CT angiogram of the chest on July 30, 2011, showed no evidence of     acute pulmonary embolism. 3. Colonoscopy performed by Dr. Jeani Hawking on 07/31/2011 showed     ileitis with internal and external hemorrhoids.  Biopsies were     taken  which ultimately showed findings consistent with Crohn's     disease.  DISCHARGE LABORATORY VALUES:  Sodium was 139, potassium 3.5, chloride 110, bicarb 23, BUN 7, creatinine 0.66, glucose 96, calcium 8.3.  White blood cell count was 14.2, hemoglobin 12.2, hematocrit 36.0, platelets 193.  Magnesium was 1.9.  Lipase was 18.  HOSPITAL COURSE: 1. Crohn's ileitis:  The patient was admitted and diagnostic     evaluation undertaken.  Consultation with Dr. [name] was made and the     patient was taken for colonoscopy, which was done as noted above.     Biopsies did confirm Crohn's disease and at this time, the patient     has improved with treatment with Asacol and steroids.  She has been     cleared for discharge by Dr. [name] with plans to continue a steroid     taper and Lialda in the outpatient environment.  She will follow up     with Dr. [name] in 1 month. 2. Hypokalemia:  The patient's potassium was repleted through the     intravenous route and her discharge potassium was normal at 3.5. 3. Steroid-induced hyperglycemia:  This was felt to be mild  and she     was initially put on sliding scale insulin but this was     discontinued and not felt to be significant. 4. Tobacco abuse:  The patient was counseled regarding the importance     of cessation.  DISPOSITION:  The patient is medically stable and will be discharged home.  CONDITION ON DISCHARGE:  Improved.  DISCHARGE INSTRUCTIONS:  Activity:  No restrictions.  Diet:  No restrictions, as tolerated.  Followup:  With Dr. [name] in 1 month.  Call for an appointment.  Other instructions:  Discontinue smoking as it makes Crohn's disease worse.  Time spent coordinating care for discharge, discharge instructions including face-to-face time, equals approximately 25 minutes.     Hillery Aldo, M.D.     CR/MEDQ  D:  08/02/2011  T:  08/02/2011  Job:  [id number]  cc:   Jordan Hawks. [name], MD Fax: [phone]  Electronically Signed by  Hillery Aldo M.D. on 08/05/2011 05:24:06 PM

## 2011-08-06 LAB — CULTURE, BLOOD (ROUTINE X 2)
Culture  Setup Time: 201208300034
Culture: NO GROWTH

## 2011-08-15 NOTE — Consult Note (Signed)
Frances Mccormick, ASK              ACCOUNT NO.:  [id number]  MEDICAL RECORD NO.:  [id number]  LOCATION:  3016                         FACILITY:  [facility]  PHYSICIAN:  Tory Emerald. Benson Norway, MD    DATE OF BIRTH:  [birth year]  DATE OF CONSULTATION:  07/30/2011 DATE OF DISCHARGE:                                CONSULTATION   REASON FOR CONSULTATION:  Colitis.  HISTORY OF PRESENT ILLNESS:  This is a 25 year old female without any significant past medical history is admitted to the hospital with recurrent complaints of abdominal pain, nausea, and vomiting.  The patient states that she started to have recurrence of her abdominal pain since 2009.  I am familiar with the patient as she underwent workup with me in 2007.  At that time, a CT scan revealed that she had some thickening in her terminal ileum as well as some surrounding lymphadenopathy.  Colonoscopy was performed at that time and the examination was negative.  There was no evidence of any intraluminal inflammation.  Biopsies were negative.  Since 2007, she did not have a followup and she reports that from 2007 and 2009 she did have any further issues with her abdominal pain.  Subsequently from 2009 until present, she had intermittent abdominal pain, but of late has been become more frequent.  Her CT scans performed in November 2011, February 2012, May 2012, and most recently today, on July 30, 2011, all reveal that she has a colitis is apparent within the transverse colon on the most recent CT scan.  The patient denies any infectious history and her abdominal pain is described as severe menstrual cramping.  It can be up to a point where she started to have nausea and vomiting.  No reports of any fever or rashes.  In the past, she has been treated with antibiotics, but it has not successfully allow for any remission in her abdominal pain symptoms.  Interestingly, on a CT scan in December 2009, where she complained of having nausea and  vomiting, there was no evidence of any abnormalities during that examination.  PAST MEDICAL AND SURGICAL HISTORY:  As stated above.  FAMILY HISTORY:  Noncontributory.  SOCIAL HISTORY:  Negative for alcohol or illicit drug use.  Occasional cigarettes.  ALLERGIES: 1. CIPRO. 2. FLAGYL. 3. HYDROCODONE.  MEDICATIONS: 1. Flagyl 500 mg IV q.8 h. 2. Zofran 4 mg IV q.4 h. p.r.n. 3. Dilaudid 1 mg IV q.2 h. p.r.n. 4. Reglan 10 mg IV q.6 h.  PHYSICAL EXAMINATION:  VITAL SIGNS:  Stable. GENERAL:  The patient is in no acute distress, alert, and oriented. HEENT:  Normocephalic, atraumatic.  Extraocular muscles intact. NECK:  Supple.  No lymphadenopathy. LUNGS:  Clear to auscultation bilaterally. CARDIOVASCULAR:  Regular rate and rhythm. ABDOMEN:  Flat, soft, tender in the mid abdomen.  No rebound or rigidity.  Positive bowel sounds. EXTREMITIES:  No clubbing, cyanosis, or edema.  LABORATORY VALUES:  White blood cell count 16.4, hemoglobin is 15.1, MCV is 79.1, platelets at 256.  Sodium is 136, potassium 3.8, chloride 100, CO2 of 23, glucose 96, BUN 9, creatinine 0.6.  Total bilirubin 0.6, alk phos is 56, AST 26, ALT 14, albumin is 4.5.  IMPRESSION:  Colitis, questionable etiology.  Again the workup in 2007 was negative for any evidence of ulcerative colitis or Crohn disease, however, with the persistence of these type of findings on the most recent CT scans, a repeat colonoscopy will be performed at this time. Pending the results we will make further recommendations.     Tory Emerald Benson Norway, MD     PDH/MEDQ  D:  07/30/2011  T:  07/31/2011  Job:  [id number]  Electronically Signed by Carol Ada MD on 08/15/2011 08:49:20 AM

## 2011-08-28 LAB — URINE MICROSCOPIC-ADD ON

## 2011-08-28 LAB — URINALYSIS, ROUTINE W REFLEX MICROSCOPIC
Leukocytes, UA: NEGATIVE
Nitrite: NEGATIVE
Specific Gravity, Urine: 1.042 — ABNORMAL HIGH
Urobilinogen, UA: 0.2

## 2011-08-28 LAB — COMPREHENSIVE METABOLIC PANEL
ALT: 16
AST: 42 — ABNORMAL HIGH
CO2: 22
Calcium: 8.7
GFR calc Af Amer: >60
GFR calc non Af Amer: >60
Potassium: 3.1 — ABNORMAL LOW
Sodium: 136
Total Protein: 7.5

## 2011-08-28 LAB — POCT PREGNANCY, URINE: Preg Test, Ur: NEGATIVE

## 2011-09-05 LAB — URINALYSIS, ROUTINE W REFLEX MICROSCOPIC
Bilirubin Urine: NEGATIVE
Hgb urine dipstick: NEGATIVE
Specific Gravity, Urine: 1.017 (ref 1.005–1.030)
pH: 7.5 (ref 5.0–8.0)

## 2011-09-05 LAB — COMPREHENSIVE METABOLIC PANEL
BUN: 6 mg/dL (ref 6–23)
CO2: 18 mEq/L — ABNORMAL LOW (ref 19–32)
Chloride: 105 mEq/L (ref 96–112)
Creatinine, Ser: 0.88 mg/dL (ref 0.4–1.2)
GFR calc non Af Amer: >60 mL/min (ref >60)
Total Bilirubin: 0.8 mg/dL (ref 0.3–1.2)

## 2011-09-05 LAB — DIFFERENTIAL
Basophils Absolute: 0 10*3/uL (ref 0.0–0.1)
Eosinophils Relative: 0 % (ref 0–5)
Lymphocytes Relative: 6 % — ABNORMAL LOW (ref 12–46)
Neutro Abs: 16.3 10*3/uL — ABNORMAL HIGH (ref 1.7–7.7)

## 2011-09-05 LAB — URINE CULTURE: Colony Count: 10000

## 2011-09-05 LAB — CBC
HCT: 45.4 % (ref 36.0–46.0)
MCV: 82.1 fL (ref 78.0–100.0)
RBC: 5.53 MIL/uL — ABNORMAL HIGH (ref 3.87–5.11)
WBC: 17.7 10*3/uL — ABNORMAL HIGH (ref 4.0–10.5)

## 2012-04-09 ENCOUNTER — Encounter (HOSPITAL_COMMUNITY): Payer: Self-pay | Admitting: *Deleted

## 2012-04-09 ENCOUNTER — Telehealth: Payer: Self-pay | Admitting: Internal Medicine

## 2012-04-09 ENCOUNTER — Emergency Department (HOSPITAL_COMMUNITY)
Admission: EM | Admit: 2012-04-09 | Discharge: 2012-04-09 | Disposition: A | Discharge status: Home / Self Care | Payer: Self-pay | Attending: Emergency Medicine | Admitting: Emergency Medicine

## 2012-04-09 ENCOUNTER — Emergency Department (HOSPITAL_COMMUNITY): Payer: Self-pay

## 2012-04-09 DIAGNOSIS — R109 Unspecified abdominal pain: Secondary | ICD-10-CM | POA: Insufficient documentation

## 2012-04-09 DIAGNOSIS — R197 Diarrhea, unspecified: Secondary | ICD-10-CM | POA: Insufficient documentation

## 2012-04-09 DIAGNOSIS — R112 Nausea with vomiting, unspecified: Secondary | ICD-10-CM | POA: Insufficient documentation

## 2012-04-09 DIAGNOSIS — K5010 Crohn's disease of large intestine without complications: Secondary | ICD-10-CM | POA: Insufficient documentation

## 2012-04-09 DIAGNOSIS — R61 Generalized hyperhidrosis: Secondary | ICD-10-CM | POA: Insufficient documentation

## 2012-04-09 DIAGNOSIS — R6883 Chills (without fever): Secondary | ICD-10-CM | POA: Insufficient documentation

## 2012-04-09 LAB — URINALYSIS, ROUTINE W REFLEX MICROSCOPIC
Bilirubin Urine: NEGATIVE
Glucose, UA: NEGATIVE mg/dL
Hgb urine dipstick: NEGATIVE
Specific Gravity, Urine: 1.016 (ref 1.005–1.030)
Urobilinogen, UA: 0.2 mg/dL (ref 0.0–1.0)

## 2012-04-09 LAB — COMPREHENSIVE METABOLIC PANEL
ALT: 16 U/L (ref 0–35)
AST: 42 U/L — ABNORMAL HIGH (ref 0–37)
Albumin: 4.6 g/dL (ref 3.5–5.2)
Alkaline Phosphatase: 55 U/L (ref 39–117)
CO2: 21 mEq/L (ref 19–32)
Chloride: 102 mEq/L (ref 96–112)
GFR calc non Af Amer: >90 mL/min (ref >90)
Potassium: 4.4 mEq/L (ref 3.5–5.1)
Sodium: 138 mEq/L (ref 135–145)
Total Bilirubin: 0.3 mg/dL (ref 0.3–1.2)

## 2012-04-09 LAB — CBC
HCT: 41.6 % (ref 36.0–46.0)
Hemoglobin: 14.1 g/dL (ref 12.0–15.0)
MCH: 26.3 pg (ref 26.0–34.0)
MCV: 77.5 fL — ABNORMAL LOW (ref 78.0–100.0)
RBC: 5.37 MIL/uL — ABNORMAL HIGH (ref 3.87–5.11)
RDW: 15 % (ref 11.5–15.5)

## 2012-04-09 LAB — DIFFERENTIAL
Basophils Absolute: 0.1 10*3/uL (ref 0.0–0.1)
Basophils Relative: 0 % (ref 0–1)
Lymphocytes Relative: 9 % — ABNORMAL LOW (ref 12–46)
Neutro Abs: 14.3 10*3/uL — ABNORMAL HIGH (ref 1.7–7.7)
Neutrophils Relative %: 87 % — ABNORMAL HIGH (ref 43–77)

## 2012-04-09 LAB — POCT PREGNANCY, URINE: Preg Test, Ur: NEGATIVE

## 2012-04-09 MED ORDER — METOCLOPRAMIDE HCL 5 MG/ML IJ SOLN
10.0000 mg | Freq: Once | INTRAMUSCULAR | Status: AC
  Administered 2012-04-09: 10 mg via INTRAVENOUS
  Filled 2012-04-09: qty 2

## 2012-04-09 MED ORDER — IOHEXOL 300 MG/ML  SOLN
80.0000 mL | Freq: Once | INTRAMUSCULAR | Status: AC | PRN
  Administered 2012-04-09: 80 mL via INTRAVENOUS

## 2012-04-09 MED ORDER — SODIUM CHLORIDE 0.9 % IV SOLN
Freq: Once | INTRAVENOUS | Status: AC
  Administered 2012-04-09: 15:00:00 via INTRAVENOUS

## 2012-04-09 MED ORDER — PROMETHAZINE HCL 25 MG/ML IJ SOLN
25.0000 mg | Freq: Once | INTRAMUSCULAR | Status: AC
  Administered 2012-04-09: 25 mg via INTRAVENOUS
  Filled 2012-04-09: qty 1

## 2012-04-09 MED ORDER — PREDNISONE 10 MG PO TABS: 40.0000 mg | ORAL_TABLET | Freq: Every day | ORAL | Status: DC

## 2012-04-09 MED ORDER — SODIUM CHLORIDE 0.9 % IV BOLUS (SEPSIS)
1000.0000 mL | Freq: Once | INTRAVENOUS | Status: AC
  Administered 2012-04-09: 1000 mL via INTRAVENOUS

## 2012-04-09 MED ORDER — ONDANSETRON HCL 4 MG/2ML IJ SOLN
4.0000 mg | Freq: Once | INTRAMUSCULAR | Status: AC
  Administered 2012-04-09: 4 mg via INTRAVENOUS
  Filled 2012-04-09: qty 2

## 2012-04-09 MED ORDER — ONDANSETRON HCL 4 MG PO TABS: 4.0000 mg | ORAL_TABLET | Freq: Four times a day (QID) | ORAL | Status: AC

## 2012-04-09 MED ORDER — MORPHINE SULFATE 4 MG/ML IJ SOLN
4.0000 mg | Freq: Once | INTRAMUSCULAR | Status: AC
  Administered 2012-04-09: 4 mg via INTRAVENOUS
  Filled 2012-04-09: qty 1

## 2012-04-09 MED ORDER — TRAMADOL HCL 50 MG PO TABS: 50.0000 mg | ORAL_TABLET | Freq: Four times a day (QID) | ORAL | Status: AC | PRN

## 2012-04-09 MED ORDER — PREDNISONE 20 MG PO TABS
60.0000 mg | ORAL_TABLET | Freq: Once | ORAL | Status: AC
  Administered 2012-04-09: 60 mg via ORAL
  Filled 2012-04-09: qty 3

## 2012-04-09 NOTE — ED Notes (Signed)
Pt states "I have Crohn's disease, I've been vomiting and in pain for a couple of hours, I tried not to wait so long but couldn't find a ride"

## 2012-04-09 NOTE — ED Notes (Signed)
Pt states she started to have continuous nausea and vomiting as well as diarrhea this morning around 7:00 a.m. Pt states she has not taken anything to relieve the N/V. Pt states pain is at a level 9

## 2012-04-09 NOTE — Telephone Encounter (Signed)
Contacted by emergency room PA regarding this patient, managed from a GI standpoint by Dr. Carol Ada Patient presented with one to 2 days of lower abdominal pain, nausea with vomiting (nonbloody nonbilious emesis) and nonbloody diarrhea. Patient with a history of colonic Crohn's disease, and reports being off maintenance medication recently due to the expense.  CT scan performed in the emergency room consistent with left-sided Crohn's colitis. Patient is reportedly afebrile and nontoxic appearing per ER provider. Patient was not seen or examined by me. White count elevated 16,000 No report of recent antibiotic Emergency room provider does not feel the patient needs admission  My recommendations are for C. difficile PCR testing to rule out infectious cause of her symptoms This likely is Crohn's colitis flare. Would recommend prednisone 40 mg by mouth daily. Would give her 7 tablets and have her contact Dr. Ulyses Amor office on Monday morning for followup and management of her flare including how to taper the prednisone. Can use anti-medics as needed and as directed She should be instructed to return to the ER for worsening pain, bloody diarrhea, intractable nausea and vomiting, inability to by mouth, fever.  Note to be sent to Dr. Benson Norway

## 2012-04-09 NOTE — Discharge Instructions (Signed)
Please follow up with Dr. Benson Norway on Monday for further management of your Crohn's disease.  If your symptoms worsen, please return to ER for further evaluation.    Crohn's Disease Crohn's disease is a long-term (chronic) soreness and redness (inflammation) of the intestines (bowel). It can affect any portion of the digestive tract, from the mouth to the anus. It can also cause problems outside the digestive tract. Crohn's disease is closely related to a disease called ulcerative colitis (together, these two diseases are called inflammatory bowel disease).  CAUSES  The cause of Crohn's disease is not known. One Link Snuffer is that, in an easily affected (susceptible) person, the immune system is triggered to attack the body's own digestive tissue. Crohn's disease runs in families. It seems to be more common in certain geographic areas and amongst certain races. There are no clear-cut dietary causes.  SYMPTOMS  Crohn's disease can cause many different symptoms since it can affect many different parts of the body. Symptoms include:  Fatigue.   Weight loss.   Chronic diarrhea, sometime bloody.   Abdominal pain and cramps.   Fever.   Ulcers or canker sores in the mouth or rectum.   Anemia (low red blood cells).   Arthritis, skin problems, and eye problems may occur.  Complications of Crohn's disease can include:  Series of holes (perforation) of the bowel.   Portions of the intestines sticking to each other (adhesions).   Obstruction of the bowel.   Fistula formation, typically in the rectal area but also in other areas. A fistula is an opening between the bowels and the outside, or between the bowels and another organ.   A painful crack in the mucous membrane of the anus (rectal fissure).  DIAGNOSIS  Your caregiver may suspect Crohn's disease based on your symptoms and an exam. Blood tests may confirm that there is a problem. You may be asked to submit a stool specimen for examination.  X-rays and CT scans may be necessary. Ultimately, the diagnosis is usually made after a procedure that uses a flexible tube that is inserted via your mouth or your anus. This is done under sedation and is called either an upper endoscopy or colonoscopy. With these tests, the specialist can take tiny tissue samples and remove them from the inside of the bowel (biopsy). Examination of this biopsy tissue under a microscope can reveal Crohn's disease as the cause of your symptoms. Due to the many different forms that Crohn's disease can take, symptoms may be present for several years before a diagnosis is made. HOME CARE INSTRUCTIONS   There is no cure for Crohn's disease. The best treatment is frequent checkups with your caregiver.   Symptoms such as diarrhea can be controlled with medications. Avoid foods that have a laxative effect such as fresh fruit, vegetables and dairy products. During flare ups, you can rest your bowel by refraining from solid foods. Drink clear liquids frequently during the day (electrolyte or re-hydrating fluids are best. Your caregiver can help you with suggestions). Drink often to prevent loss of body fluids (dehydration). When diarrhea has cleared, eat small meals and more frequently. Avoid food additives and stimulants such as caffeine (coffee, tea, or chocolate). Enzyme supplements may help if you develop intolerance to a sugar in dairy products (lactose). Ask your caregiver or dietitian about specific dietary instructions.   Try to maintain a positive attitude. Learn relaxation techniques such as self hypnosis, mental imaging, and muscle relaxation.   If possible, avoid stresses  which can aggravate your condition.   Exercise regularly.   Follow your diet.   Always get plenty of rest.  SEEK MEDICAL CARE IF:   Your symptoms fail to improve after a week or two of new treatment.   You experience continued weight loss.   You have ongoing crampy digestion or loose  bowels.   You develop a new skin rash, skin sores, or eye problems.  SEEK IMMEDIATE MEDICAL CARE IF:   You have worsening of your symptoms or develop new symptoms.   You have a fever.   You develop bloody diarrhea.   You develop severe abdominal pain.  MAKE SURE YOU:   Understand these instructions.   Will watch your condition.   Will get help right away if you are not doing well or get worse.  Document Released: 08/27/2005 Document Revised: 11/06/2011 Document Reviewed: 07/26/2007 [facility] Patient Information 2012 [place].

## 2012-04-09 NOTE — ED Provider Notes (Signed)
History     CSN: [id number]  Arrival date & time 04/09/12  1209   First MD Initiated Contact with Patient 04/09/12 1230      Chief Complaint  Patient presents with  . Abdominal Pain  . Emesis    (Consider location/radiation/quality/duration/timing/severity/associated sxs/prior treatment) HPI  26 year old female with history of Crohn's disease presents with abdominal pain. She experienced increasing lower abnormal pain with associated nausea, vomiting, diarrhea. Vomitus is of food content. Diarrhea is nonbloody. She also experiencing chills and diaphoresis. Symptoms started this morning. Symptom is constant. It is felt very similar to her prior Crohn's flare. Patient states she has not taken her Crohn's medication for the past month because she didn't have the money to afford it.  She denies fever, chest pain, shortness of breath, urinary symptoms, vaginal bleeding, vaginal discharge, or rash. Her last menstrual cycle was 2 weeks ago.  Denies recent sick contact.  Denies recent abx use.    Past Medical History  Diagnosis Date  . Crohn's disease     Past Surgical History  Procedure Date  . Induced abortion     No family history on file.  History  Substance Use Topics  . Smoking status: Never Smoker   . Smokeless tobacco: Not on file  . Alcohol Use: Yes     ocassionally    OB History    Grav Para Term Preterm Abortions TAB SAB Ect Mult Living                  Review of Systems  All other systems reviewed and are negative.    Allergies  Vicodin  Home Medications  No current outpatient prescriptions on file.  BP 138/93  Pulse 51  Temp(Src) 98.7 F (37.1 C) (Oral)  Resp 18  Wt 155 lb (70.308 kg)  SpO2 100%  LMP 03/19/2012  Physical Exam  Nursing note and vitals reviewed. Constitutional: She appears well-developed and well-nourished.       Diaphoresis, appears uncomfortable.    HENT:  Mouth/Throat: Oropharynx is clear and moist. No oropharyngeal  exudate.  Eyes: Conjunctivae are normal.  Neck: Neck supple.  Cardiovascular: Normal rate and regular rhythm.   Pulmonary/Chest: Effort normal and breath sounds normal. No respiratory distress. She exhibits no tenderness.  Abdominal: Soft. There is tenderness in the suprapubic area and left lower quadrant. There is no rigidity, no rebound, no guarding, no CVA tenderness, no tenderness at McBurney's point and negative Murphy's sign. No hernia. Hernia confirmed negative in the ventral area.  Genitourinary: Rectum normal. Rectal exam shows anal tone normal. Guaiac negative stool.  Neurological: She is alert.  Skin: Skin is warm.  Psychiatric: She has a normal mood and affect.    ED Course  Procedures (including critical care time)  Labs Reviewed - No data to display No results found.   No diagnosis found.  Results for orders placed during the hospital encounter of 04/09/12  URINALYSIS, ROUTINE W REFLEX MICROSCOPIC      Component Value Range   Color, Urine YELLOW  YELLOW    APPearance CLEAR  CLEAR    Specific Gravity, Urine 1.016  1.005 - 1.030    pH 8.5 (*) 5.0 - 8.0    Glucose, UA NEGATIVE  NEGATIVE (mg/dL)   Hgb urine dipstick NEGATIVE  NEGATIVE    Bilirubin Urine NEGATIVE  NEGATIVE    Ketones, ur 40 (*) NEGATIVE (mg/dL)   Protein, ur NEGATIVE  NEGATIVE (mg/dL)   Urobilinogen, UA 0.2  0.0 -  1.0 (mg/dL)   Nitrite NEGATIVE  NEGATIVE    Leukocytes, UA NEGATIVE  NEGATIVE   CBC      Component Value Range   WBC 16.4 (*) 4.0 - 10.5 (K/uL)   RBC 5.37 (*) 3.87 - 5.11 (MIL/uL)   Hemoglobin 14.1  12.0 - 15.0 (g/dL)   HCT 41.6  36.0 - 46.0 (%)   MCV 77.5 (*) 78.0 - 100.0 (fL)   MCH 26.3  26.0 - 34.0 (pg)   MCHC 33.9  30.0 - 36.0 (g/dL)   RDW 15.0  11.5 - 15.5 (%)   Platelets 296  150 - 400 (K/uL)  DIFFERENTIAL      Component Value Range   Neutrophils Relative 87 (*) 43 - 77 (%)   Neutro Abs 14.3 (*) 1.7 - 7.7 (K/uL)   Lymphocytes Relative 9 (*) 12 - 46 (%)   Lymphs Abs 1.5  0.7  - 4.0 (K/uL)   Monocytes Relative 3  3 - 12 (%)   Monocytes Absolute 0.5  0.1 - 1.0 (K/uL)   Eosinophils Relative 0  0 - 5 (%)   Eosinophils Absolute 0.0  0.0 - 0.7 (K/uL)   Basophils Relative 0  0 - 1 (%)   Basophils Absolute 0.1  0.0 - 0.1 (K/uL)  COMPREHENSIVE METABOLIC PANEL      Component Value Range   Sodium 138  135 - 145 (mEq/L)   Potassium 4.4  3.5 - 5.1 (mEq/L)   Chloride 102  96 - 112 (mEq/L)   CO2 21  19 - 32 (mEq/L)   Glucose, Bld 124 (*) 70 - 99 (mg/dL)   BUN 7  6 - 23 (mg/dL)   Creatinine, Ser 0.70  0.50 - 1.10 (mg/dL)   Calcium 9.3  8.4 - 10.5 (mg/dL)   Total Protein 8.3  6.0 - 8.3 (g/dL)   Albumin 4.6  3.5 - 5.2 (g/dL)   AST 42 (*) 0 - 37 (U/L)   ALT 16  0 - 35 (U/L)   Alkaline Phosphatase 55  39 - 117 (U/L)   Total Bilirubin 0.3  0.3 - 1.2 (mg/dL)   GFR calc non Af Amer >90  >90 (mL/min)   GFR calc Af Amer >90  >90 (mL/min)  LIPASE, BLOOD      Component Value Range   Lipase 22  11 - 59 (U/L)  POCT PREGNANCY, URINE      Component Value Range   Preg Test, Ur NEGATIVE  NEGATIVE   OCCULT BLOOD, POC DEVICE      Component Value Range   Fecal Occult Bld NEGATIVE     Ct Abdomen Pelvis W Contrast  04/09/2012  *RADIOLOGY REPORT*  Clinical Data: Abdominal pain.  Crohn's.  CT ABDOMEN AND PELVIS WITH CONTRAST  Technique:  Multidetector CT imaging of the abdomen and pelvis was performed following the standard protocol during bolus administration of intravenous contrast.  Contrast: 64m OMNIPAQUE IOHEXOL 300 MG/ML  SOLN  Comparison: 07/30/2011  Findings: Stable liver.  Normal appendix.  The entire length of the colon is relatively decompressed.   There is felt to be mild wall thickening of the descending and sigmoid colon.  There is subtle inflammatory changes in the adjacent fat. See images 51-55.  There is no extraluminal bowel gas or abscess formation.  No evidence of small bowel wall thickening.  Moderate free fluid in the pelvis likely is physiologic.  Bladder, uterus, and  ovaries are within normal limits.  Kidneys, pancreas, spleen, and adrenal glands are within normal  limits.  IMPRESSION: Mild inflammatory change of the descending and sigmoid colon consistent with the given history of Crohn's disease  Moderate free fluid in the pelvis may simply be physiologic from ovulation.  Normal appendix.  Original Report Authenticated By: Jamas Lav, M.D.       MDM  abd pain with N/V/D.  Hemoccult negative.  Hx of Crohn's and currently not taking medication for it.    3:07 PM Continues to endorse nausea.  Pain has improved.  Zofran and reglan given.  Abd/pelvic ct scan ordered.     4:58 PM Has an elevated white count of 16.4 with a left shift. Normal and pelvis CT scan shows evidence of a mild inflammatory changes of the descending and sigmoid colon consistence with Crohn's disease. I discussed this with my attending. Plan to consult with Dr. Benson Norway, patient's GI specialist for further evaluation.  5:48 PM I have consulted with Dr. Deanne Coffer from [facility], who recommend having pt on 7 days of prednisone 40m as well as zofran and some pain management.  Pt is to f/u with Dr. HBenson Norwayon Monday for further management of her Crohn's flare.  Strict return precaution discussed with pt.  Pt currently feels better, able to tolerates PO, abd pain improves, VSS.  Pt sts she has taken percocet without side effect in the past.  Will give a short course  BDomenic Moras PA-C 04/09/12 [place] PA-C 04/09/12 1757

## 2012-04-09 NOTE — ED Notes (Signed)
Pt requesting to take prednisone after nausea medication starts working. NAD noted at this time

## 2012-04-10 NOTE — ED Provider Notes (Signed)
Medical screening examination/treatment/procedure(s) were conducted as a shared visit with non-physician practitioner(s) and myself.  I personally evaluated the patient during the encounter Pt with hx crohns disease w abd pain/cramping, nausea, few loose bms/not bloody. abd soft, mild mid abd tenderness, no peritoneal findings. Ct, discussed w gi.   Mirna Mires, MD 04/10/12 [phone]

## 2012-08-02 ENCOUNTER — Emergency Department (HOSPITAL_COMMUNITY): Payer: Self-pay

## 2012-08-02 ENCOUNTER — Telehealth: Payer: Self-pay | Admitting: Internal Medicine

## 2012-08-02 ENCOUNTER — Encounter (HOSPITAL_COMMUNITY): Payer: Self-pay | Admitting: Emergency Medicine

## 2012-08-02 ENCOUNTER — Emergency Department (HOSPITAL_COMMUNITY)
Admission: EM | Admit: 2012-08-02 | Discharge: 2012-08-02 | Disposition: A | Discharge status: Home / Self Care | Payer: Self-pay | Attending: Emergency Medicine | Admitting: Emergency Medicine

## 2012-08-02 DIAGNOSIS — R10819 Abdominal tenderness, unspecified site: Secondary | ICD-10-CM | POA: Insufficient documentation

## 2012-08-02 DIAGNOSIS — R109 Unspecified abdominal pain: Secondary | ICD-10-CM | POA: Insufficient documentation

## 2012-08-02 DIAGNOSIS — K5090 Crohn's disease, unspecified, without complications: Secondary | ICD-10-CM | POA: Insufficient documentation

## 2012-08-02 LAB — CBC WITH DIFFERENTIAL/PLATELET
Basophils Absolute: 0.1 10*3/uL (ref 0.0–0.1)
Eosinophils Absolute: 0 10*3/uL (ref 0.0–0.7)
Eosinophils Relative: 0 % (ref 0–5)
Lymphocytes Relative: 7 % — ABNORMAL LOW (ref 12–46)
MCV: 78.3 fL (ref 78.0–100.0)
Neutrophils Relative %: 89 % — ABNORMAL HIGH (ref 43–77)
Platelets: 257 10*3/uL (ref 150–400)
RDW: 14.8 % (ref 11.5–15.5)
WBC: 18.9 10*3/uL — ABNORMAL HIGH (ref 4.0–10.5)

## 2012-08-02 LAB — COMPREHENSIVE METABOLIC PANEL
Albumin: 4.5 g/dL (ref 3.5–5.2)
Alkaline Phosphatase: 75 U/L (ref 39–117)
BUN: 5 mg/dL — ABNORMAL LOW (ref 6–23)
CO2: 21 mEq/L (ref 19–32)
Chloride: 101 mEq/L (ref 96–112)
GFR calc non Af Amer: >90 mL/min (ref >90)
Glucose, Bld: 98 mg/dL (ref 70–99)
Potassium: 3.6 mEq/L (ref 3.5–5.1)
Total Bilirubin: 0.2 mg/dL — ABNORMAL LOW (ref 0.3–1.2)

## 2012-08-02 LAB — URINALYSIS, ROUTINE W REFLEX MICROSCOPIC
Bilirubin Urine: NEGATIVE
Hgb urine dipstick: NEGATIVE
Protein, ur: NEGATIVE mg/dL
Urobilinogen, UA: 0.2 mg/dL (ref 0.0–1.0)

## 2012-08-02 LAB — LIPASE, BLOOD: Lipase: 18 U/L (ref 11–59)

## 2012-08-02 MED ORDER — ONDANSETRON HCL 4 MG/2ML IJ SOLN
4.0000 mg | Freq: Once | INTRAMUSCULAR | Status: AC
  Administered 2012-08-02: 4 mg via INTRAVENOUS
  Filled 2012-08-02: qty 2

## 2012-08-02 MED ORDER — ONDANSETRON 4 MG PO TBDP: 4.0000 mg | ORAL_TABLET | Freq: Three times a day (TID) | ORAL | Status: AC | PRN

## 2012-08-02 MED ORDER — SODIUM CHLORIDE 0.9 % IV SOLN
INTRAVENOUS | Status: DC
  Administered 2012-08-02 (×2): via INTRAVENOUS

## 2012-08-02 MED ORDER — FENTANYL CITRATE 0.05 MG/ML IJ SOLN
50.0000 ug | INTRAMUSCULAR | Status: AC | PRN
  Administered 2012-08-02 (×2): 50 ug via INTRAVENOUS
  Filled 2012-08-02 (×2): qty 2

## 2012-08-02 MED ORDER — IOHEXOL 300 MG/ML  SOLN
100.0000 mL | Freq: Once | INTRAMUSCULAR | Status: AC | PRN
  Administered 2012-08-02: 100 mL via INTRAVENOUS

## 2012-08-02 MED ORDER — ONDANSETRON HCL 4 MG/2ML IJ SOLN
4.0000 mg | INTRAMUSCULAR | Status: AC | PRN
  Administered 2012-08-02 (×2): 4 mg via INTRAVENOUS
  Filled 2012-08-02 (×2): qty 2

## 2012-08-02 MED ORDER — MORPHINE SULFATE 4 MG/ML IJ SOLN
4.0000 mg | Freq: Once | INTRAMUSCULAR | Status: AC
  Administered 2012-08-02: 4 mg via INTRAVENOUS
  Filled 2012-08-02: qty 1

## 2012-08-02 MED ORDER — PROMETHAZINE HCL 25 MG/ML IJ SOLN: 25.0000 mg | Freq: Once | INTRAMUSCULAR | Status: DC

## 2012-08-02 MED ORDER — TRAMADOL HCL 50 MG PO TABS: 50.0000 mg | ORAL_TABLET | Freq: Four times a day (QID) | ORAL | Status: AC | PRN

## 2012-08-02 MED ORDER — METHYLPREDNISOLONE SODIUM SUCC 125 MG IJ SOLR
125.0000 mg | Freq: Once | INTRAMUSCULAR | Status: AC
  Administered 2012-08-02: 125 mg via INTRAVENOUS
  Filled 2012-08-02: qty 2

## 2012-08-02 NOTE — ED Notes (Signed)
D/C and F/U instructions reviewed with mother and patient who verbalize understanding.  Pt ambulates out of dept without difficulty. dph

## 2012-08-02 NOTE — ED Notes (Signed)
Pt completed 1st cup of contrast.  1 episode of diarrhea & 1 episode of vomiting followed.  Pt denies need for additional dose of Zofran at this point and continues drinking contrast.  dph

## 2012-08-02 NOTE — Telephone Encounter (Signed)
Patient of Dr. Haywood Pao - has Crohn's 24 hrs Nausea, vomiting and diarrhea. Stabilized and improved in ED. CT suggests ileitis.  Rec: IV solumedrol x 1 and anti-emetics  Contact with Dr. [name] tomorrow when office open to coordinate further care.

## 2012-08-02 NOTE — ED Notes (Signed)
Encouraged pt to drink CT contrast. dph

## 2012-08-02 NOTE — ED Provider Notes (Signed)
History     CSN: [id number]  Arrival date & time 08/02/12  0358   First MD Initiated Contact with Patient 08/02/12 [phone]      Chief Complaint  Patient presents with  . Emesis    (Consider location/radiation/quality/duration/timing/severity/associated sxs/prior treatment) The history is provided by the patient and the spouse.   26 showed, female, with a history of Crohn's disease, who is currently not taking any medications for Crohn's presents to emergency department complaining of abdominal pain, with vomiting, and diarrhea since yesterday.  She denies fevers, or rash.  She denies blood in her stool or emesis.  She has not been on antibiotics recently.  She denies respiratory and urinary symptoms.  Past Medical History  Diagnosis Date  . Crohn's disease     Past Surgical History  Procedure Date  . Induced abortion     No family history on file.  History  Substance Use Topics  . Smoking status: Never Smoker   . Smokeless tobacco: Not on file  . Alcohol Use: Yes     ocassionally    OB History    Grav Para Term Preterm Abortions TAB SAB Ect Mult Living                  Review of Systems  Constitutional: Negative for fever, chills and fatigue.  Respiratory: Negative for cough and shortness of breath.   Cardiovascular: Negative for chest pain.  Gastrointestinal: Positive for nausea, vomiting, abdominal pain and diarrhea. Negative for constipation and blood in stool.  Genitourinary: Negative for dysuria and hematuria.  Musculoskeletal: Negative for back pain.  Skin: Negative for rash.  Neurological: Negative for headaches.  Psychiatric/Behavioral: Negative for confusion.  All other systems reviewed and are negative.    Allergies  Bee venom; Ciprofloxacin; and Vicodin  Home Medications   Current Outpatient Rx  Name Route Sig Dispense Refill  . BIOTIN 5 MG PO CAPS Oral Take 1 capsule by mouth daily.      BP 155/82  Pulse 70  Temp 98.3 F (36.8 C) (Oral)   Resp 16  SpO2 99%  Physical Exam  Nursing note and vitals reviewed. Constitutional: She is oriented to person, place, and time. She appears well-developed and well-nourished. No distress.  HENT:  Head: Normocephalic and atraumatic.  Eyes: Conjunctivae are normal.  Neck: Normal range of motion. Neck supple.  Cardiovascular: Normal rate and intact distal pulses.   No murmur heard. Pulmonary/Chest: Effort normal and breath sounds normal.  Abdominal: Soft. She exhibits no distension. There is tenderness. There is no rebound and no guarding.       Mild diffuse tenderness, with no peritoneal signs  Musculoskeletal: Normal range of motion. She exhibits no edema and no tenderness.  Neurological: She is alert and oriented to person, place, and time.  Skin: Skin is warm and dry.  Psychiatric: She has a normal mood and affect. Thought content normal.    ED Course  Procedures (including critical care time) diffuse abdominal pain, with vomiting, and diarrhea.  In a patient with known Crohn's disease.  No fever.  No peritoneal signs.  I will give IV analgesics, and perform laboratory testing, for evaluation.  There is no indication of acute abdomen so, I do not think a CAT scan is indicated at this time   Labs Reviewed  COMPREHENSIVE METABOLIC PANEL  CBC WITH DIFFERENTIAL  LIPASE, BLOOD   No results found.   No diagnosis found.    MDM  Crohn's disease. Abdominal pain,  with vomiting, and diarrhea        Cheri Guppy, MD 08/02/12 [phone]

## 2012-08-02 NOTE — ED Provider Notes (Signed)
0800:  Rec'd pt at shift change.  16XW F, c/o periumbilical abd "pain" as well as N/V/D since last night.  Feels this may be a flair of her Crohn's.  Has not been taking her GI meds or seen her GI MD in approx 3+ months due to insurance/financial issues.  Denies black or blood in stools or emesis, no fevers.  VSS, afebrile in ED, CTA, RRR, abd soft/diffusely TTP.  Pending labs, UA/preg, CT A/P.  Results for orders placed during the hospital encounter of 08/02/12  COMPREHENSIVE METABOLIC PANEL      Component Value Range   Sodium 136  135 - 145 mEq/L   Potassium 3.6  3.5 - 5.1 mEq/L   Chloride 101  96 - 112 mEq/L   CO2 21  19 - 32 mEq/L   Glucose, Bld 98  70 - 99 mg/dL   BUN 5 (*) 6 - 23 mg/dL   Creatinine, Ser 0.53  0.50 - 1.10 mg/dL   Calcium 8.9  8.4 - 10.5 mg/dL   Total Protein 8.4 (*) 6.0 - 8.3 g/dL   Albumin 4.5  3.5 - 5.2 g/dL   AST 26  0 - 37 U/L   ALT 12  0 - 35 U/L   Alkaline Phosphatase 75  39 - 117 U/L   Total Bilirubin 0.2 (*) 0.3 - 1.2 mg/dL   GFR calc non Af Amer >90  >90 mL/min   GFR calc Af Amer >90  >90 mL/min  CBC WITH DIFFERENTIAL      Component Value Range   WBC 18.9 (*) 4.0 - 10.5 K/uL   RBC 5.72 (*) 3.87 - 5.11 MIL/uL   Hemoglobin 15.4 (*) 12.0 - 15.0 g/dL   HCT 44.8  36.0 - 46.0 %   MCV 78.3  78.0 - 100.0 fL   MCH 26.9  26.0 - 34.0 pg   MCHC 34.4  30.0 - 36.0 g/dL   RDW 14.8  11.5 - 15.5 %   Platelets 257  150 - 400 K/uL   Neutrophils Relative 89 (*) 43 - 77 %   Neutro Abs 16.9 (*) 1.7 - 7.7 K/uL   Lymphocytes Relative 7 (*) 12 - 46 %   Lymphs Abs 1.3  0.7 - 4.0 K/uL   Monocytes Relative 4  3 - 12 %   Monocytes Absolute 0.7  0.1 - 1.0 K/uL   Eosinophils Relative 0  0 - 5 %   Eosinophils Absolute 0.0  0.0 - 0.7 K/uL   Basophils Relative 0  0 - 1 %   Basophils Absolute 0.1  0.0 - 0.1 K/uL  LIPASE, BLOOD      Component Value Range   Lipase 18  11 - 59 U/L  PREGNANCY, URINE      Component Value Range   Preg Test, Ur NEGATIVE  NEGATIVE  URINALYSIS,  ROUTINE W REFLEX MICROSCOPIC      Component Value Range   Color, Urine YELLOW  YELLOW   APPearance CLEAR  CLEAR   Specific Gravity, Urine 1.011  1.005 - 1.030   pH 8.0  5.0 - 8.0   Glucose, UA NEGATIVE  NEGATIVE mg/dL   Hgb urine dipstick NEGATIVE  NEGATIVE   Bilirubin Urine NEGATIVE  NEGATIVE   Ketones, ur NEGATIVE  NEGATIVE mg/dL   Protein, ur NEGATIVE  NEGATIVE mg/dL   Urobilinogen, UA 0.2  0.0 - 1.0 mg/dL   Nitrite NEGATIVE  NEGATIVE   Leukocytes, UA NEGATIVE  NEGATIVE   Ct Abdomen Pelvis W  Contrast 08/02/2012  *RADIOLOGY REPORT*  Clinical Data: Crohn's disease, abdominal pain  CT ABDOMEN AND PELVIS WITH CONTRAST  Technique:  Multidetector CT imaging of the abdomen and pelvis was performed following the standard protocol during bolus administration of intravenous contrast.  Contrast: 125m OMNIPAQUE IOHEXOL 300 MG/ML  SOLN  Comparison: 04/09/2012  Findings: Visualized lung bases clear.  Stable focal fatty infiltration in the medial left hepatic segment.  Unremarkable nondistended gallbladder, spleen, adrenal glands, kidneys, pancreas, portal vein, abdominal aorta.  Stomach physiologically distended.  Small bowel is nondilated. There is mild mucosal enhancement in the distal ileum without adjacent inflammatory/edematous change.  Normal appendix. The colon is nondilated, unremarkable.  Urinary bladder incompletely distended. Bilateral ovarian cyst.  Uterus unremarkable.  Trace free fluid in the cul-de-sac.  No free air. Increased number of sub centimeter right lower quadrant and central mesenteric lymph nodes.  No retroperitoneal, or pelvic adenopathy. Regional bones unremarkable.  IMPRESSION:  1.  No acute abdominal process.   Original Report Authenticated By: DDillard CannonIII, M.D.      1210:   Pt feels improved after meds.  No stooling while in the ED.  Has tol PO well without vomiting while in the ED.  States she wants to go home now.  T/C to GI Dr. GCarlean Purl(on call for Dr. HBenson Norway, case  discussed, including:  HPI, pertinent PM/SHx, VS/PE, dx testing, ED course and treatment:  requests to give one dose IV solumedrol now, have pt f/u with Dr. HBenson Norwayin the office tomorrow, requests NOT to write any further steroid or any abx orders and that Dr. HBenson Norwaywill provide follow up tomorrow in the office to make further decisions regarding both.  Dx testing, as well as d/w GI MD, d/w pt and family.  Questions answered.  Verb understanding, agreeable to d/c home with outpt f/u in GI MD office tomorrow.       KAlfonzo Feller DO 08/05/12 1145

## 2012-08-02 NOTE — ED Notes (Signed)
Pt accompanined by friend in rm. 10, appears diaphoretic, alert and oriented x 3. N/V/D onset around 2300. States pain is all around.

## 2012-08-02 NOTE — ED Notes (Signed)
Pt alert, arrives from home, c/o nausea and emesis, onset a few hours ago, resp even unlabored, skin pwd, hx of chron's,

## 2012-11-19 ENCOUNTER — Emergency Department (HOSPITAL_COMMUNITY)
Admission: EM | Admit: 2012-11-19 | Discharge: 2012-11-19 | Disposition: A | Discharge status: Home / Self Care | Payer: Self-pay | Attending: Emergency Medicine | Admitting: Emergency Medicine

## 2012-11-19 ENCOUNTER — Encounter (HOSPITAL_COMMUNITY): Payer: Self-pay | Admitting: Emergency Medicine

## 2012-11-19 DIAGNOSIS — R509 Fever, unspecified: Secondary | ICD-10-CM | POA: Insufficient documentation

## 2012-11-19 DIAGNOSIS — K089 Disorder of teeth and supporting structures, unspecified: Secondary | ICD-10-CM | POA: Insufficient documentation

## 2012-11-19 DIAGNOSIS — K0889 Other specified disorders of teeth and supporting structures: Secondary | ICD-10-CM

## 2012-11-19 DIAGNOSIS — Z8719 Personal history of other diseases of the digestive system: Secondary | ICD-10-CM | POA: Insufficient documentation

## 2012-11-19 MED ORDER — IBUPROFEN 800 MG PO TABS: 800.0000 mg | ORAL_TABLET | Freq: Three times a day (TID) | ORAL | Status: DC

## 2012-11-19 MED ORDER — OXYCODONE-ACETAMINOPHEN 5-325 MG PO TABS: 1.0000 | ORAL_TABLET | Freq: Four times a day (QID) | ORAL | Status: DC | PRN

## 2012-11-19 MED ORDER — PENICILLIN V POTASSIUM 500 MG PO TABS: 500.0000 mg | ORAL_TABLET | Freq: Three times a day (TID) | ORAL | Status: DC

## 2012-11-19 NOTE — ED Notes (Signed)
Patient states her back molars on the right side are both broken and infected.  Patient states she was running a fever last night up to 101.0.

## 2012-11-19 NOTE — ED Provider Notes (Signed)
History     CSN: [id number]  Arrival date & time 11/19/12  1740   First MD Initiated Contact with Patient 11/19/12 1754      Chief Complaint  Patient presents with  . Dental Pain    (Consider location/radiation/quality/duration/timing/severity/associated sxs/prior treatment) HPI  26 year old female presents complaining of dental pain. Patient reports she actually chip a tooth about 2 weeks ago and for the past several nights she noticed that the chip is getting progressively worse, especially when she wakes up in the morning. She thinks she is grinding her teeth at night. Since yesterday she has developed a gradual onset of sharp throbbing nonradiating pain to the R lower tooth, worsening with cold air or with chewing.  Pain has been persistent, with swelling, and in moderate intensity.  She took advil without adequate relief.  She Has fever of 101 last night.  Denies sore throat, neck pain or ear pain.  No recent trauma.    Past Medical History  Diagnosis Date  . Crohn's disease     Past Surgical History  Procedure Date  . Induced abortion     History reviewed. No pertinent family history.  History  Substance Use Topics  . Smoking status: Never Smoker   . Smokeless tobacco: Not on file  . Alcohol Use: Yes     Comment: ocassionally    OB History    Grav Para Term Preterm Abortions TAB SAB Ect Mult Living                  Review of Systems  Constitutional: Positive for fever.  HENT: Positive for dental problem. Negative for ear pain, sore throat, trouble swallowing, neck pain and voice change.   Skin: Negative for rash.  Neurological: Negative for speech difficulty and numbness.    Allergies  Bee venom; Ciprofloxacin; and Vicodin  Home Medications   Current Outpatient Rx  Name  Route  Sig  Dispense  Refill  . BIOTIN 5 MG PO CAPS   Oral   Take 1 capsule by mouth daily.           BP 128/74  Pulse 93  Temp 99.3 F (37.4 C) (Oral)  Resp 20  Ht 5\' 1"   (1.549 m)  Wt 161 lb 3.2 oz (73.12 kg)  BMI 30.46 kg/m2  SpO2 100%  LMP 10/29/2012  Physical Exam  Nursing note and vitals reviewed. Constitutional: She is oriented to person, place, and time. She appears well-developed and well-nourished. No distress.  HENT:  Head: Atraumatic.  Right Ear: External ear normal.  Left Ear: External ear normal.  Mouth/Throat: No oropharyngeal exudate.    Eyes: Conjunctivae normal are normal.  Neck: Normal range of motion. Neck supple.  Lymphadenopathy:    She has no cervical adenopathy.  Neurological: She is alert and oriented to person, place, and time.  Skin: Skin is warm. No rash noted.    ED Course  Procedures (including critical care time)  Labs Reviewed - No data to display No results found.   No diagnosis found.  1. Dental pain  MDM  Dental pain 2/2 dental decay.  Will prescribe abx, pain meds and referral to dentist.    Pt reports she is allergic to vicodin but able to tolerates Percocet without difficulty.   BP 128/74  Pulse 93  Temp 99.3 F (37.4 C) (Oral)  Resp 20  Ht 5\' 1"  (1.549 m)  Wt 161 lb 3.2 oz (73.12 kg)  BMI 30.46 kg/m2  SpO2 100%  LMP 10/29/2012       Fayrene Helper, PA-C 11/19/12 1814  Fayrene Helper, PA-C 11/19/12 1818

## 2012-11-20 NOTE — ED Provider Notes (Signed)
Medical screening examination/treatment/procedure(s) were performed by non-physician practitioner and as supervising physician I was immediately available for consultation/collaboration.   Breanah Faddis III, MD 11/20/12 1414 

## 2013-02-12 ENCOUNTER — Emergency Department (INDEPENDENT_AMBULATORY_CARE_PROVIDER_SITE_OTHER)
Admission: EM | Admit: 2013-02-12 | Discharge: 2013-02-12 | Disposition: A | Discharge status: Home / Self Care | Payer: Self-pay | Source: Home / Self Care | Attending: Emergency Medicine | Admitting: Emergency Medicine

## 2013-02-12 ENCOUNTER — Encounter (HOSPITAL_COMMUNITY): Payer: Self-pay

## 2013-02-12 DIAGNOSIS — J029 Acute pharyngitis, unspecified: Secondary | ICD-10-CM

## 2013-02-12 LAB — POCT RAPID STREP A: Streptococcus, Group A Screen (Direct): NEGATIVE

## 2013-02-12 MED ORDER — ACETAMINOPHEN-CODEINE 120-12 MG/5ML PO SUSP: 10.0000 mL | ORAL | Status: DC | PRN

## 2013-02-12 MED ORDER — HYDROCODONE-ACETAMINOPHEN 5-325 MG PO TABS
1.0000 | ORAL_TABLET | Freq: Once | ORAL | Status: AC
  Administered 2013-02-12: 1 via ORAL

## 2013-02-12 MED ORDER — DEXAMETHASONE SODIUM PHOSPHATE 10 MG/ML IJ SOLN
10.0000 mg | Freq: Once | INTRAMUSCULAR | Status: AC
  Administered 2013-02-12: 10 mg via INTRAVENOUS

## 2013-02-12 MED ORDER — DEXAMETHASONE SODIUM PHOSPHATE 10 MG/ML IJ SOLN
INTRAMUSCULAR | Status: AC
  Filled 2013-02-12: qty 1

## 2013-02-12 MED ORDER — DEXAMETHASONE SODIUM PHOSPHATE 10 MG/ML IJ SOLN
10.0000 mg | Freq: Once | INTRAMUSCULAR | Status: AC
  Administered 2013-02-12: 10 mg via INTRAMUSCULAR

## 2013-02-12 MED ORDER — HYDROCODONE-ACETAMINOPHEN 5-325 MG PO TABS
ORAL_TABLET | ORAL | Status: AC
  Filled 2013-02-12: qty 1

## 2013-02-12 MED ORDER — PENICILLIN G BENZATHINE 1200000 UNIT/2ML IM SUSP
INTRAMUSCULAR | Status: AC
  Filled 2013-02-12: qty 2

## 2013-02-12 MED ORDER — PENICILLIN G BENZATHINE 1200000 UNIT/2ML IM SUSP
1.2000 10*6.[IU] | Freq: Once | INTRAMUSCULAR | Status: AC
  Administered 2013-02-12: 1.2 10*6.[IU] via INTRAMUSCULAR

## 2013-02-12 NOTE — ED Provider Notes (Signed)
Medical screening examination/treatment/procedure(s) were performed by non-physician practitioner and as supervising physician I was immediately available for consultation/collaboration.  Nixie Laube, M.D.  Titilayo Hagans C Damiean Lukes, MD 02/12/13 2034 

## 2013-02-12 NOTE — ED Provider Notes (Signed)
History     CSN: [id number]  Arrival date & time 02/12/13  1126   First MD Initiated Contact with Patient 02/12/13 1229      Chief Complaint  Patient presents with  . Sore Throat    HPI: Patient is a 27 y.o. female presenting with pharyngitis. The history is provided by the patient.  Sore Throat This is a new problem. The current episode started 2 days ago. The problem occurs constantly. The problem has been gradually worsening. The symptoms are aggravated by eating. Nothing relieves the symptoms.  Pt reports onset of (R) sided sore throat on Thursday that progressed to (L) side on Friday and was associated w/ a fever of 102 Friday as well. Denies other associated URI sx's such as cough, sinus congestion, ear pain etc. Admits to h/o strep throat in the past. Swallowing is painful but pt has been able to eat and drink sufficiently.   Past Medical History  Diagnosis Date  . Crohn's disease     Past Surgical History  Procedure Laterality Date  . Induced abortion      History reviewed. No pertinent family history.  History  Substance Use Topics  . Smoking status: Never Smoker   . Smokeless tobacco: Not on file  . Alcohol Use: Yes     Comment: ocassionally    OB History   Grav Para Term Preterm Abortions TAB SAB Ect Mult Living                  Review of Systems  Constitutional: Negative.   HENT: Positive for sore throat and voice change. Negative for ear pain, congestion, facial swelling and neck stiffness.   Eyes: Negative.   Respiratory: Negative.   Cardiovascular: Negative.   Gastrointestinal: Negative.   Endocrine: Negative.   Genitourinary: Negative.   Allergic/Immunologic: Negative.   Hematological: Negative.   Psychiatric/Behavioral: Negative.   All other systems reviewed and are negative.    Allergies  Bee venom; Ciprofloxacin; and Vicodin  Home Medications   Current Outpatient Rx  Name  Route  Sig  Dispense  Refill  . Biotin (BIOTIN 5000) 5 MG  CAPS   Oral   Take 1 capsule by mouth daily.         Marland Kitchen ibuprofen (ADVIL,MOTRIN) 800 MG tablet   Oral   Take 1 tablet (800 mg total) by mouth 3 (three) times daily.   21 tablet   0   . oxyCODONE-acetaminophen (PERCOCET/ROXICET) 5-325 MG per tablet   Oral   Take 1 tablet by mouth every 6 (six) hours as needed for pain.   10 tablet   0   . penicillin v potassium (VEETID) 500 MG tablet   Oral   Take 1 tablet (500 mg total) by mouth 3 (three) times daily.   30 tablet   0     BP 123/83  Pulse 82  Temp(Src) 99.3 F (37.4 C) (Oral)  Resp 16  SpO2 97%  LMP 01/22/2013  Physical Exam  Constitutional: She is oriented to person, place, and time. She appears well-developed and well-nourished.  HENT:  Head: Normocephalic and atraumatic.  Right Ear: Tympanic membrane, external ear and ear canal normal.  Left Ear: Tympanic membrane, external ear and ear canal normal.  Nose: Nose normal.  Mouth/Throat: Uvula is midline.  Tonsils 2-3+ bil with significant erythema and large patches of purulent exudate.  Cardiovascular: Normal rate.   Pulmonary/Chest: Effort normal.  Musculoskeletal: Normal range of motion.  Neurological: She is alert  and oriented to person, place, and time.  Skin: Skin is warm and dry.    ED Course  Procedures (including critical care time)  Labs Reviewed  POCT RAPID STREP A ([facility] ONLY)   No results found.   No diagnosis found.    MDM  2 day h/o sever sore throat and fever of 102 yesterday. PE remarkable for 2-3+ swelling to tonsils bil, erythema and large white patches of purulent exudate. Rapid strep neg. Will treat for "Exudative Tonsilitis/pharyngitis" w/ PCN. IM dexamethazone and short course of pain med. Pt to return or see PCP if not improving.         Leanne Chang, NP 02/12/13 1311

## 2013-02-12 NOTE — ED Notes (Signed)
C/o pain w swallowing, right ear pressure; NAD, able to handle her own secretions

## 2013-06-26 ENCOUNTER — Encounter (HOSPITAL_COMMUNITY): Payer: Self-pay

## 2013-06-26 ENCOUNTER — Emergency Department (HOSPITAL_COMMUNITY): Payer: Self-pay

## 2013-06-26 ENCOUNTER — Emergency Department (HOSPITAL_COMMUNITY)
Admission: EM | Admit: 2013-06-26 | Discharge: 2013-06-26 | Disposition: A | Discharge status: Home / Self Care | Payer: Self-pay | Attending: Emergency Medicine | Admitting: Emergency Medicine

## 2013-06-26 DIAGNOSIS — R109 Unspecified abdominal pain: Secondary | ICD-10-CM

## 2013-06-26 DIAGNOSIS — K5010 Crohn's disease of large intestine without complications: Secondary | ICD-10-CM | POA: Insufficient documentation

## 2013-06-26 DIAGNOSIS — Z3202 Encounter for pregnancy test, result negative: Secondary | ICD-10-CM | POA: Insufficient documentation

## 2013-06-26 DIAGNOSIS — R1110 Vomiting, unspecified: Secondary | ICD-10-CM | POA: Insufficient documentation

## 2013-06-26 DIAGNOSIS — R197 Diarrhea, unspecified: Secondary | ICD-10-CM | POA: Insufficient documentation

## 2013-06-26 LAB — LIPASE, BLOOD: Lipase: 16 U/L (ref 11–59)

## 2013-06-26 LAB — COMPREHENSIVE METABOLIC PANEL
AST: 22 U/L (ref 0–37)
Albumin: 4 g/dL (ref 3.5–5.2)
BUN: 12 mg/dL (ref 6–23)
Chloride: 101 mEq/L (ref 96–112)
Creatinine, Ser: 0.75 mg/dL (ref 0.50–1.10)
Total Protein: 8 g/dL (ref 6.0–8.3)

## 2013-06-26 LAB — CBC WITH DIFFERENTIAL/PLATELET
Basophils Absolute: 0.1 10*3/uL (ref 0.0–0.1)
Basophils Relative: 0 % (ref 0–1)
Eosinophils Absolute: 0 10*3/uL (ref 0.0–0.7)
HCT: 42.1 % (ref 36.0–46.0)
Hemoglobin: 14.3 g/dL (ref 12.0–15.0)
MCH: 26.4 pg (ref 26.0–34.0)
MCHC: 34 g/dL (ref 30.0–36.0)
Monocytes Absolute: 0.5 10*3/uL (ref 0.1–1.0)
Monocytes Relative: 3 % (ref 3–12)
Neutro Abs: 17.2 10*3/uL — ABNORMAL HIGH (ref 1.7–7.7)
Neutrophils Relative %: 90 % — ABNORMAL HIGH (ref 43–77)
RDW: 14.6 % (ref 11.5–15.5)

## 2013-06-26 LAB — POCT PREGNANCY, URINE: Preg Test, Ur: NEGATIVE

## 2013-06-26 MED ORDER — MORPHINE SULFATE 4 MG/ML IJ SOLN
4.0000 mg | Freq: Once | INTRAMUSCULAR | Status: AC
  Administered 2013-06-26: 4 mg via INTRAVENOUS
  Filled 2013-06-26: qty 1

## 2013-06-26 MED ORDER — IOHEXOL 300 MG/ML  SOLN
100.0000 mL | Freq: Once | INTRAMUSCULAR | Status: AC | PRN
  Administered 2013-06-26: 100 mL via INTRAVENOUS

## 2013-06-26 MED ORDER — PREDNISONE 20 MG PO TABS: ORAL_TABLET | ORAL | Status: DC

## 2013-06-26 MED ORDER — HYDROMORPHONE HCL PF 1 MG/ML IJ SOLN
0.5000 mg | Freq: Once | INTRAMUSCULAR | Status: AC
  Administered 2013-06-26: 0.5 mg via INTRAVENOUS
  Filled 2013-06-26: qty 1

## 2013-06-26 MED ORDER — IOHEXOL 300 MG/ML  SOLN
50.0000 mL | Freq: Once | INTRAMUSCULAR | Status: AC | PRN
  Administered 2013-06-26: 50 mL via ORAL

## 2013-06-26 MED ORDER — METHYLPREDNISOLONE SODIUM SUCC 125 MG IJ SOLR
125.0000 mg | Freq: Once | INTRAMUSCULAR | Status: AC
  Administered 2013-06-26: 125 mg via INTRAVENOUS
  Filled 2013-06-26: qty 2

## 2013-06-26 MED ORDER — ONDANSETRON HCL 4 MG/2ML IJ SOLN
4.0000 mg | Freq: Once | INTRAMUSCULAR | Status: AC
  Administered 2013-06-26: 4 mg via INTRAVENOUS
  Filled 2013-06-26: qty 2

## 2013-06-26 MED ORDER — SODIUM CHLORIDE 0.9 % IV BOLUS (SEPSIS)
1000.0000 mL | Freq: Once | INTRAVENOUS | Status: AC
  Administered 2013-06-26: 1000 mL via INTRAVENOUS

## 2013-06-26 MED ORDER — TRAMADOL HCL 50 MG PO TABS: 50.0000 mg | ORAL_TABLET | Freq: Four times a day (QID) | ORAL | Status: DC | PRN

## 2013-06-26 MED ORDER — ONDANSETRON HCL 8 MG PO TABS: 8.0000 mg | ORAL_TABLET | Freq: Three times a day (TID) | ORAL | Status: DC | PRN

## 2013-06-26 NOTE — ED Provider Notes (Signed)
Pt signed out by Dr Doy Mince to check ct when back.  Ct back, c/w colitis, similar to pts prior abd ct. Recheck pt mild mid to left abd tenderness. No rebound or guarding. Pt afeb. Mild nausea persists. Dilaudid .5 mg iv. zofran iv.  Discussed pt, ct/labs, hx, w Dr Collene Mares, on call for pts gi md Dr Benson Norway - she indicates placed on pred and have f/u with him tomorrow morning.   Discussed plan w pt/family - agreeable.  Pt appears stable for d/c.       Mirna Mires, MD 06/26/13 [phone]

## 2013-06-26 NOTE — ED Notes (Signed)
She c/o generalized abd. Discomfort plus n/v/d which she recognizes as signs of Crohn's flare.  She is in no distress.

## 2013-06-26 NOTE — ED Provider Notes (Signed)
CSN: [id number]     Arrival date & time 06/26/13  1337 History     First MD Initiated Contact with Patient 06/26/13 1423     Chief Complaint  Patient presents with  . Abdominal Pain    Hx Crohn's   (Consider location/radiation/quality/duration/timing/severity/associated sxs/prior Treatment) HPI Comments: 27 year old female with history of Crohn's disease presenting with vomiting diarrhea and crampy abdominal pain. She states this is consistent with her prior Crohn's flareups.  Patient is a 27 y.o. female presenting with abdominal pain.  Abdominal Pain This is a recurrent problem. Episode onset: Today. The problem occurs constantly. The problem has been gradually worsening. Associated symptoms include abdominal pain. Pertinent negatives include no chest pain and no shortness of breath. Exacerbated by: Vomiting, diarrhea. Nothing relieves the symptoms. Treatments tried: Promethazine. The treatment provided mild relief.    Past Medical History  Diagnosis Date  . Crohn's disease    Past Surgical History  Procedure Laterality Date  . Induced abortion     History reviewed. No pertinent family history. History  Substance Use Topics  . Smoking status: Never Smoker   . Smokeless tobacco: Not on file  . Alcohol Use: Yes     Comment: ocassionally   OB History   Grav Para Term Preterm Abortions TAB SAB Ect Mult Living                 Review of Systems  Constitutional: Negative for fever.  HENT: Negative for congestion.   Respiratory: Negative for cough and shortness of breath.   Cardiovascular: Negative for chest pain.  Gastrointestinal: Positive for abdominal pain. Negative for nausea, vomiting, diarrhea and blood in stool.  Genitourinary: Negative for menstrual problem (LMP one week ago).  All other systems reviewed and are negative.    Allergies  Bee venom; Ciprofloxacin; and Vicodin  Home Medications   Current Outpatient Rx  Name  Route  Sig  Dispense  Refill  .  ibuprofen (ADVIL,MOTRIN) 200 MG tablet   Oral   Take 400 mg by mouth every 6 (six) hours as needed for pain.          BP 113/76  Pulse 87  Temp(Src) 98.3 F (36.8 C) (Oral)  Resp 16  SpO2 100%  LMP 06/18/2013 Physical Exam  Nursing note and vitals reviewed. Constitutional: She is oriented to person, place, and time. She appears well-developed and well-nourished. No distress.  HENT:  Head: Normocephalic and atraumatic.  Mouth/Throat: Oropharynx is clear and moist.  Eyes: Conjunctivae are normal. Pupils are equal, round, and reactive to light. No scleral icterus.  Neck: Neck supple.  Cardiovascular: Normal rate, regular rhythm, normal heart sounds and intact distal pulses.   No murmur heard. Pulmonary/Chest: Effort normal and breath sounds normal. No stridor. No respiratory distress. She has no rales.  Abdominal: Soft. Bowel sounds are normal. She exhibits no distension. There is tenderness in the suprapubic area and left lower quadrant. There is no rigidity, no rebound and no guarding.  Musculoskeletal: Normal range of motion.  Neurological: She is alert and oriented to person, place, and time.  Skin: Skin is warm and dry. No rash noted.  Psychiatric: She has a normal mood and affect. Her behavior is normal.    ED Course   Procedures (including critical care time)  Labs Reviewed  CBC WITH DIFFERENTIAL - Abnormal; Notable for the following:    WBC 19.1 (*)    RBC 5.41 (*)    MCV 77.8 (*)    Neutrophils Relative %  90 (*)    Neutro Abs 17.2 (*)    Lymphocytes Relative 7 (*)    All other components within normal limits  COMPREHENSIVE METABOLIC PANEL - Abnormal; Notable for the following:    Total Bilirubin 0.2 (*)    All other components within normal limits  LIPASE, BLOOD  POCT PREGNANCY, URINE   No results found. 1. Abdominal pain   2. Crohn's disease of colon, without complications   3. Diarrhea     MDM  27 yo female w hx of Crohn's disease with cramping  abdominal pain, vomiting, and diarrhea.  She has not been on any medications for approximately 9 months.  Her symptoms are consistent with prior Crohn's flares.  She has abdominal tenderness, but no peritoneal signs.  We discussed avoiding CT imaging due to many frequent scans (although none this year).  Family was uncomfortable with this, so CT was ordered.  Labwork pending.  IV zofran and morphine (she states she can take this despite listed allergy to hydrocodone).    Labwork shows leukocytosis.  CT pending at time care transferred to Dr. Denton Lank.   Candyce Churn, MD 06/27/13 2008

## 2013-09-05 ENCOUNTER — Emergency Department (INDEPENDENT_AMBULATORY_CARE_PROVIDER_SITE_OTHER)
Admission: EM | Admit: 2013-09-05 | Discharge: 2013-09-05 | Disposition: A | Discharge status: Home / Self Care | Payer: Self-pay | Source: Home / Self Care | Attending: Family Medicine | Admitting: Family Medicine

## 2013-09-05 ENCOUNTER — Encounter (HOSPITAL_COMMUNITY): Payer: Self-pay | Admitting: Emergency Medicine

## 2013-09-05 DIAGNOSIS — J029 Acute pharyngitis, unspecified: Secondary | ICD-10-CM

## 2013-09-05 MED ORDER — ACETAMINOPHEN 325 MG PO TABS
ORAL_TABLET | ORAL | Status: AC
  Filled 2013-09-05: qty 2

## 2013-09-05 MED ORDER — ACETAMINOPHEN 325 MG PO TABS
650.0000 mg | ORAL_TABLET | Freq: Once | ORAL | Status: AC
  Administered 2013-09-05: 650 mg via ORAL

## 2013-09-05 MED ORDER — AMOXICILLIN 875 MG PO TABS: 875.0000 mg | ORAL_TABLET | Freq: Two times a day (BID) | ORAL | Status: DC

## 2013-09-05 NOTE — ED Notes (Signed)
Fever and sore throat onset yesterday.  Patient reports having near syncopal episode at church services yesterday, feeling weak, dizzy

## 2013-09-05 NOTE — ED Provider Notes (Signed)
CSN: [id number]     Arrival date & time 09/05/13  1512 History   First MD Initiated Contact with Patient 09/05/13 1641     Chief Complaint  Patient presents with  . Fever   (Consider location/radiation/quality/duration/timing/severity/associated sxs/prior Treatment) Patient is a 27 y.o. female presenting with pharyngitis. The history is provided by the patient.  Sore Throat This is a new problem. The current episode started yesterday. The problem occurs constantly. The problem has been gradually worsening. Associated symptoms comments: Fever and dizziness. The symptoms are aggravated by swallowing. Nothing relieves the symptoms. She has tried nothing for the symptoms.    Past Medical History  Diagnosis Date  . Crohn's disease    Past Surgical History  Procedure Laterality Date  . Induced abortion     History reviewed. No pertinent family history. History  Substance Use Topics  . Smoking status: Never Smoker   . Smokeless tobacco: Not on file  . Alcohol Use: Yes     Comment: ocassionally   OB History   Grav Para Term Preterm Abortions TAB SAB Ect Mult Living                 Review of Systems  Constitutional: Positive for fever and chills.  HENT: Positive for sore throat. Negative for congestion, rhinorrhea and postnasal drip.   Respiratory: Negative for cough.   Neurological: Positive for dizziness.    Allergies  Bee venom; Ciprofloxacin; and Vicodin  Home Medications   Current Outpatient Rx  Name  Route  Sig  Dispense  Refill  . amoxicillin (AMOXIL) 875 MG tablet   Oral   Take 1 tablet (875 mg total) by mouth 2 (two) times daily.   20 tablet   0   . ibuprofen (ADVIL,MOTRIN) 200 MG tablet   Oral   Take 400 mg by mouth every 6 (six) hours as needed for pain.         Marland Kitchen ondansetron (ZOFRAN) 8 MG tablet   Oral   Take 1 tablet (8 mg total) by mouth every 8 (eight) hours as needed for nausea.   10 tablet   0   . predniSONE (DELTASONE) 20 MG tablet      3  po once a day for 2 days, then 2 po once a day for 3 days, then 1 po once a day for 3 days   15 tablet   0   . traMADol (ULTRAM) 50 MG tablet   Oral   Take 1 tablet (50 mg total) by mouth every 6 (six) hours as needed for pain.   20 tablet   0    BP 109/69  Pulse 114  Temp(Src) 102.1 F (38.9 C) (Oral)  Resp 20  SpO2 99%  LMP 08/22/2013 Physical Exam  Constitutional: She appears well-developed and well-nourished. She appears ill.  HENT:  Right Ear: Tympanic membrane, external ear and ear canal normal.  Left Ear: External ear normal.  Mouth/Throat: Oropharyngeal exudate, posterior oropharyngeal edema and posterior oropharyngeal erythema present.  L ear canal with cerumen, unable to see TM  Cardiovascular: Regular rhythm.  Tachycardia present.   Pulmonary/Chest: Effort normal and breath sounds normal.  Lymphadenopathy:       Head (right side): Submandibular and tonsillar adenopathy present. No submental adenopathy present.       Head (left side): Submandibular and tonsillar adenopathy present. No submental adenopathy present.    She has no cervical adenopathy.    ED Course  Procedures (including critical care time) Labs  Review Labs Reviewed  CULTURE, GROUP A STREP  POCT RAPID STREP A ([facility] ONLY)   Imaging Review No results found.  MDM   1. Pharyngitis   rapid strep negative but pt exam and sx c/w strep. Rx amoxicillin 875mg  po BID #20.      Cathlyn Parsons, NP 09/05/13 1724

## 2013-09-07 LAB — CULTURE, GROUP A STREP

## 2013-09-07 NOTE — ED Provider Notes (Signed)
Medical screening examination/treatment/procedure(s) were performed by resident physician or non-physician practitioner and as supervising physician I was immediately available for consultation/collaboration.   Kacey Dysert DOUGLAS MD.   Jadda Hunsucker D Esra Frankowski, MD 09/07/13 2106 

## 2014-01-04 ENCOUNTER — Emergency Department (HOSPITAL_COMMUNITY)
Admission: EM | Admit: 2014-01-04 | Discharge: 2014-01-04 | Disposition: A | Discharge status: Home / Self Care | Payer: Managed Care, Other (non HMO) | Attending: Emergency Medicine | Admitting: Emergency Medicine

## 2014-01-04 ENCOUNTER — Encounter (HOSPITAL_COMMUNITY): Payer: Self-pay | Admitting: Emergency Medicine

## 2014-01-04 ENCOUNTER — Emergency Department (HOSPITAL_COMMUNITY): Payer: Managed Care, Other (non HMO)

## 2014-01-04 DIAGNOSIS — B349 Viral infection, unspecified: Secondary | ICD-10-CM

## 2014-01-04 DIAGNOSIS — B9789 Other viral agents as the cause of diseases classified elsewhere: Secondary | ICD-10-CM | POA: Insufficient documentation

## 2014-01-04 DIAGNOSIS — IMO0001 Reserved for inherently not codable concepts without codable children: Secondary | ICD-10-CM | POA: Insufficient documentation

## 2014-01-04 DIAGNOSIS — Z8719 Personal history of other diseases of the digestive system: Secondary | ICD-10-CM | POA: Insufficient documentation

## 2014-01-04 DIAGNOSIS — R197 Diarrhea, unspecified: Secondary | ICD-10-CM | POA: Insufficient documentation

## 2014-01-04 DIAGNOSIS — R1084 Generalized abdominal pain: Secondary | ICD-10-CM | POA: Insufficient documentation

## 2014-01-04 DIAGNOSIS — R52 Pain, unspecified: Secondary | ICD-10-CM | POA: Insufficient documentation

## 2014-01-04 DIAGNOSIS — Z3202 Encounter for pregnancy test, result negative: Secondary | ICD-10-CM | POA: Insufficient documentation

## 2014-01-04 DIAGNOSIS — R05 Cough: Secondary | ICD-10-CM | POA: Insufficient documentation

## 2014-01-04 DIAGNOSIS — R110 Nausea: Secondary | ICD-10-CM | POA: Insufficient documentation

## 2014-01-04 DIAGNOSIS — R059 Cough, unspecified: Secondary | ICD-10-CM | POA: Insufficient documentation

## 2014-01-04 LAB — URINALYSIS, ROUTINE W REFLEX MICROSCOPIC
Bilirubin Urine: NEGATIVE
Glucose, UA: NEGATIVE mg/dL
Hgb urine dipstick: NEGATIVE
KETONES UR: NEGATIVE mg/dL
Leukocytes, UA: NEGATIVE
NITRITE: NEGATIVE
Protein, ur: NEGATIVE mg/dL
Specific Gravity, Urine: 1.023 (ref 1.005–1.030)
UROBILINOGEN UA: 0.2 mg/dL (ref 0.0–1.0)
pH: 6.5 (ref 5.0–8.0)

## 2014-01-04 LAB — CBC WITH DIFFERENTIAL/PLATELET
BASOS ABS: 0.1 10*3/uL (ref 0.0–0.1)
Basophils Relative: 1 % (ref 0–1)
EOS PCT: 4 % (ref 0–5)
Eosinophils Absolute: 0.5 10*3/uL (ref 0.0–0.7)
HCT: 40.5 % (ref 36.0–46.0)
Hemoglobin: 13.5 g/dL (ref 12.0–15.0)
Lymphocytes Relative: 21 % (ref 12–46)
Lymphs Abs: 2.3 10*3/uL (ref 0.7–4.0)
MCH: 26.1 pg (ref 26.0–34.0)
MCHC: 33.3 g/dL (ref 30.0–36.0)
MCV: 78.3 fL (ref 78.0–100.0)
MONO ABS: 0.7 10*3/uL (ref 0.1–1.0)
Monocytes Relative: 7 % (ref 3–12)
Neutro Abs: 7.7 10*3/uL (ref 1.7–7.7)
Neutrophils Relative %: 68 % (ref 43–77)
Platelets: 314 10*3/uL (ref 150–400)
RBC: 5.17 MIL/uL — ABNORMAL HIGH (ref 3.87–5.11)
RDW: 15.9 % — AB (ref 11.5–15.5)
WBC: 11.3 10*3/uL — ABNORMAL HIGH (ref 4.0–10.5)

## 2014-01-04 LAB — LIPASE, BLOOD: LIPASE: 27 U/L (ref 11–59)

## 2014-01-04 LAB — COMPREHENSIVE METABOLIC PANEL
ALBUMIN: 3.4 g/dL — AB (ref 3.5–5.2)
ALT: 12 U/L (ref 0–35)
AST: 18 U/L (ref 0–37)
Alkaline Phosphatase: 62 U/L (ref 39–117)
BILIRUBIN TOTAL: 0.3 mg/dL (ref 0.3–1.2)
BUN: 10 mg/dL (ref 6–23)
CHLORIDE: 103 meq/L (ref 96–112)
CO2: 25 mEq/L (ref 19–32)
CREATININE: 0.79 mg/dL (ref 0.50–1.10)
Calcium: 8.8 mg/dL (ref 8.4–10.5)
GFR calc non Af Amer: >90 mL/min (ref >90)
Glucose, Bld: 98 mg/dL (ref 70–99)
Potassium: 4.1 mEq/L (ref 3.7–5.3)
Sodium: 138 mEq/L (ref 137–147)
TOTAL PROTEIN: 7.4 g/dL (ref 6.0–8.3)

## 2014-01-04 LAB — OCCULT BLOOD, POC DEVICE: Fecal Occult Bld: NEGATIVE

## 2014-01-04 LAB — POCT PREGNANCY, URINE: Preg Test, Ur: NEGATIVE

## 2014-01-04 MED ORDER — ONDANSETRON HCL 4 MG PO TABS: 4.0000 mg | ORAL_TABLET | Freq: Four times a day (QID) | ORAL | Status: DC

## 2014-01-04 MED ORDER — KETOROLAC TROMETHAMINE 30 MG/ML IJ SOLN
30.0000 mg | Freq: Once | INTRAMUSCULAR | Status: AC
  Administered 2014-01-04: 30 mg via INTRAVENOUS
  Filled 2014-01-04: qty 1

## 2014-01-04 MED ORDER — ONDANSETRON HCL 4 MG/2ML IJ SOLN
4.0000 mg | Freq: Once | INTRAMUSCULAR | Status: AC
  Administered 2014-01-04: 4 mg via INTRAVENOUS
  Filled 2014-01-04: qty 2

## 2014-01-04 MED ORDER — SODIUM CHLORIDE 0.9 % IV BOLUS (SEPSIS)
1000.0000 mL | Freq: Once | INTRAVENOUS | Status: AC
  Administered 2014-01-04: 1000 mL via INTRAVENOUS

## 2014-01-04 NOTE — Discharge Instructions (Signed)
Take tylenol, motrin for pain.   Stay hydrated.   You may take imodium AT MOST once a day.   Follow up with Dr. Benson Norway.   Return to ER if you have severe pain, vomiting, blood in stool, fevers.    Diet for Diarrhea, Adult Frequent, runny stools (diarrhea) may be caused or worsened by food or drink. Diarrhea may be relieved by changing your diet. Since diarrhea can last up to 7 days, it is easy for you to lose too much fluid from the body and become dehydrated. Fluids that are lost need to be replaced. Along with a modified diet, make sure you drink enough fluids to keep your urine clear or pale yellow. DIET INSTRUCTIONS  Ensure adequate fluid intake (hydration): have 1 cup (8 oz) of fluid for each diarrhea episode. Avoid fluids that contain simple sugars or sports drinks, fruit juices, whole milk products, and sodas. Your urine should be clear or pale yellow if you are drinking enough fluids. Hydrate with an oral rehydration solution that you can purchase at pharmacies, retail stores, and online. You can prepare an oral rehydration solution at home by mixing the following ingredients together:    tsp table salt.   tsp baking soda.   tsp salt substitute containing potassium chloride.  1  tablespoons sugar.  1 L (34 oz) of water.  Certain foods and beverages may increase the speed at which food moves through the gastrointestinal (GI) tract. These foods and beverages should be avoided and include:  Caffeinated and alcoholic beverages.  High-fiber foods, such as raw fruits and vegetables, nuts, seeds, and whole grain breads and cereals.  Foods and beverages sweetened with sugar alcohols, such as xylitol, sorbitol, and mannitol.  Some foods may be well tolerated and may help thicken stool including:  Starchy foods, such as rice, toast, pasta, low-sugar cereal, oatmeal, grits, baked potatoes, crackers, and bagels.   Bananas.   Applesauce.  Add probiotic-rich foods to help  increase healthy bacteria in the GI tract, such as yogurt and fermented milk products. RECOMMENDED FOODS AND BEVERAGES Starches Choose foods with less than 2 g of fiber per serving.  Recommended:  White, [place], and pita breads, plain rolls, buns, bagels. Plain muffins, matzo. Soda, saltine, or graham crackers. Pretzels, melba toast, zwieback. Cooked cereals made with water: cornmeal, farina, cream cereals. Dry cereals: refined corn, wheat, rice. Potatoes prepared any way without skins, refined macaroni, spaghetti, noodles, refined rice.  Avoid:  Bread, rolls, or crackers made with whole wheat, multi-grains, rye, bran seeds, nuts, or coconut. Corn tortillas or taco shells. Cereals containing whole grains, multi-grains, bran, coconut, nuts, raisins. Cooked or dry oatmeal. Coarse wheat cereals, granola. Cereals advertised as "high-fiber." Potato skins. Whole grain pasta, wild or brown rice. Popcorn. Sweet potatoes, yams. Sweet rolls, doughnuts, waffles, pancakes, sweet breads. Vegetables  Recommended: Strained tomato and vegetable juices. Most well-cooked and canned vegetables without seeds. Fresh: Tender lettuce, cucumber without the skin, cabbage, spinach, bean sprouts.  Avoid: Fresh, cooked, or canned: Artichokes, baked beans, beet greens, broccoli, Brussels sprouts, corn, kale, legumes, peas, sweet potatoes. Cooked: Green or red cabbage, spinach. Avoid large servings of any vegetables because vegetables shrink when cooked, and they contain more fiber per serving than fresh vegetables. Fruit  Recommended: Cooked or canned: Apricots, applesauce, cantaloupe, cherries, fruit cocktail, grapefruit, grapes, kiwi, mandarin oranges, peaches, pears, plums, watermelon. Fresh: Apples without skin, ripe banana, grapes, cantaloupe, cherries, grapefruit, peaches, oranges, plums. Keep servings limited to  cup or 1 piece.  Avoid: Fresh: Apples with skin, apricots, mangoes, pears, raspberries, strawberries. Prune  juice, stewed or dried prunes. Dried fruits, raisins, dates. Large servings of all fresh fruits. Protein  Recommended: Ground or well-cooked tender beef, ham, veal, lamb, pork, or poultry. Eggs. Fish, oysters, shrimp, lobster, other seafoods. Liver, organ meats.  Avoid: Tough, fibrous meats with gristle. Peanut butter, smooth or chunky. Cheese, nuts, seeds, legumes, dried peas, beans, lentils. Dairy  Recommended: Yogurt, lactose-free milk, kefir, drinkable yogurt, buttermilk, soy milk, or plain hard cheese.  Avoid: Milk, chocolate milk, beverages made with milk, such as milkshakes. Soups  Recommended: Bouillon, broth, or soups made from allowed foods. Any strained soup.  Avoid: Soups made from vegetables that are not allowed, cream or milk-based soups. Desserts and Sweets  Recommended: Sugar-free gelatin, sugar-free frozen ice pops made without sugar alcohol.  Avoid: Plain cakes and cookies, pie made with fruit, pudding, custard, cream pie. Gelatin, fruit, ice, sherbet, frozen ice pops. Ice cream, ice milk without nuts. Plain hard candy, honey, jelly, molasses, syrup, sugar, chocolate syrup, gumdrops, marshmallows. Fats and Oils  Recommended: Limit fats to less than 8 tsp per day.  Avoid: Seeds, nuts, olives, avocados. Margarine, butter, cream, mayonnaise, salad oils, plain salad dressings. Plain gravy, crisp bacon without rind. Beverages  Recommended: Water, decaffeinated teas, oral rehydration solutions, sugar-free beverages not sweetened with sugar alcohols.  Avoid: Fruit juices, caffeinated beverages (coffee, tea, soda), alcohol, sports drinks, or lemon-lime soda. Condiments  Recommended: Ketchup, mustard, horseradish, vinegar, cocoa powder. Spices in moderation: allspice, basil, bay leaves, celery powder or leaves, cinnamon, cumin powder, curry powder, ginger, mace, marjoram, onion or garlic powder, oregano, paprika, parsley flakes, ground pepper, rosemary, sage, savory,  tarragon, thyme, turmeric.  Avoid: Coconut, honey. Document Released: 02/07/2004 Document Revised: 08/11/2012 Document Reviewed: 04/02/2012 [facility] Patient Information 2014 [employer].

## 2014-01-04 NOTE — ED Provider Notes (Signed)
CSN: [id number]     Arrival date & time 01/04/14  1941 History   First MD Initiated Contact with Patient 01/04/14 1027     Chief Complaint  Patient presents with  . Diarrhea  . Generalized Body Aches   (Consider location/radiation/quality/duration/timing/severity/associated sxs/prior Treatment) The history is provided by the patient.  Frances Mccormick is a 28 y.o. female hx of Crohn's not on meds here with diarrhea, chills. She been having some diarrhea for the last 3 days. Some nausea when she been no vomiting. Yesterday she had some body aches and chills. She also has some nonproductive cough but no fever. 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 She had prednisone in the past for Crohn's but not currently on it right now. No abdominal surgeries.    Past Medical History  Diagnosis Date  . Crohn's disease    Past Surgical History  Procedure Laterality Date  . Induced abortion     No family history on file. History  Substance Use Topics  . Smoking status: Never Smoker   . Smokeless tobacco: Not on file  . Alcohol Use: Yes     Comment: ocassionally   OB History   Grav Para Term Preterm Abortions TAB SAB Ect Mult Living                 Review of Systems  Gastrointestinal: Positive for nausea, abdominal pain and diarrhea.  Musculoskeletal: Positive for myalgias.  All other systems reviewed and are negative.    Allergies  Bee venom; Ciprofloxacin; and Vicodin  Home Medications   Current Outpatient Rx  Name  Route  Sig  Dispense  Refill  . Acetaminophen (TYLENOL ARTHRITIS EXT RELIEF PO)   Oral   Take 2 tablets by mouth every 6 (six) hours as needed (pain).         Marland Kitchen DM-Phenylephrine-Acetaminophen (TYLENOL COLD MULTI-SYMPTOM DAY PO)   Oral   Take 30 mLs by mouth every 6 (six) hours as needed (cold symptoms).          BP 109/75  Pulse 55  Temp(Src) 98.2 F (36.8 C) (Oral)  Resp 16  SpO2 98%  LMP 12/21/2013 Physical Exam   Nursing note and vitals reviewed. Constitutional: She is oriented to person, place, and time. She appears well-developed and well-nourished.  HENT:  Head: Normocephalic.  MM slightly dry   Eyes: Conjunctivae are normal. Pupils are equal, round, and reactive to light.  Neck: Normal range of motion. Neck supple.  Cardiovascular: Normal rate, regular rhythm and normal heart sounds.   Pulmonary/Chest: Effort normal and breath sounds normal. No respiratory distress. She has no wheezes. She has no rales.  Abdominal: Soft. Bowel sounds are normal.  Mild diffuse tenderness, worse in lower abdomen, no rebound   Musculoskeletal: Normal range of motion.  Neurological: She is alert and oriented to person, place, and time. No cranial nerve deficit. Coordination normal.  Skin: Skin is warm and dry.  Psychiatric: She has a normal mood and affect. Her behavior is normal. Judgment and thought content normal.    ED Course  Procedures (including critical care time) Labs Review Labs Reviewed  CBC WITH DIFFERENTIAL - Abnormal; Notable for the following:    WBC 11.3 (*)    RBC 5.17 (*)    RDW 15.9 (*)    All other components within normal limits  COMPREHENSIVE METABOLIC PANEL - Abnormal; Notable for the following:    Albumin 3.4 (*)  All other components within normal limits  URINALYSIS, ROUTINE W REFLEX MICROSCOPIC  LIPASE, BLOOD  POCT PREGNANCY, URINE  OCCULT BLOOD, POC DEVICE   Imaging Review Dg Abd Acute W/chest  01/04/2014   CLINICAL DATA:  Diarrhea for 3 days, history heart murmur, Crohn's disease  EXAM: ACUTE ABDOMEN SERIES (ABDOMEN 2 VIEW & CHEST 1 VIEW)  COMPARISON:  Chest radiograph 03/14/2006, abdominal and pelvic CT 06/26/2013  FINDINGS: Normal heart size, mediastinal contours, and pulmonary vascularity.  Lungs clear.  No pleural effusion or pneumothorax.  Normal bowel gas pattern.  No bowel dilatation, bowel wall thickening or free intraperitoneal air.  Osseous structures unremarkable.   No urinary tract calcification.  IMPRESSION: No acute abnormalities.   Electronically Signed   By: Lavonia Dana M.D.   On: 01/04/2014 10:52    EKG Interpretation   None       MDM  No diagnosis found. Frances Mccormick is a 28 y.o. female here with diarrhea, ab pain, nausea, myalgias. Likely viral syndrome vs early crohn's flare. She states that she usually gets blood in stool when she has severe crohn's exacerbation but has no blood currently. Will check electrolytes, hydrate patient. Will reassess.   12:37 PM Labs unremarkable. Xray showed no SBO. Her WBC was always elevated around 14 K but is 11 K today. Abdomen soft, nontender now. I think she has viral syndrome and less likely to be Crohn's flare. Recommend GI f/u, tylenol, motrin.    Wandra Arthurs, MD 01/04/14 1239

## 2014-01-04 NOTE — Progress Notes (Signed)
[facility] provided pt with a list of primary care resources and ACA information. Patient stated that she was pending insurance through job.

## 2014-01-04 NOTE — ED Provider Notes (Signed)
Medical screening examination/treatment/procedure(s) were performed by non-physician practitioner and as supervising physician I was immediately available for consultation/collaboration.  EKG Interpretation   None         Wandra Arthurs, MD 01/04/14 [phone]

## 2014-01-04 NOTE — ED Provider Notes (Signed)
MSE was initiated and I personally evaluated the patient and placed orders (if any) at  10:21 AM on January 04, 2014.  The patient appears stable so that the remainder of the MSE may be completed by another provider.  Patient here with chills, cough, sore throat for the past day, she reports diarrhea x 3 days with crampy lower abdominal pain just preceeding and immediately following episodes of diarrhea.  She states that she usually has nausea with her exacerbations of Crohn's.  She reports multiple episodes of watery diarrhea and she did notice blood on the toilet paper when she wiped yesterday but none in the toilet.  She did not receive the flu vaccine this year.  Idalia Needle Joelyn Oms, PA-C 01/04/14 [phone]

## 2014-01-04 NOTE — ED Notes (Signed)
Per pt, states diarrhea for 3 days-thought it might be related to Chronn's but has been experiencing body aches and chills/sweats

## 2014-02-08 ENCOUNTER — Emergency Department (HOSPITAL_COMMUNITY)
Admission: EM | Admit: 2014-02-08 | Discharge: 2014-02-08 | Disposition: A | Discharge status: Home / Self Care | Payer: Managed Care, Other (non HMO) | Attending: Emergency Medicine | Admitting: Emergency Medicine

## 2014-02-08 ENCOUNTER — Encounter (HOSPITAL_COMMUNITY): Payer: Self-pay | Admitting: Emergency Medicine

## 2014-02-08 DIAGNOSIS — K5090 Crohn's disease, unspecified, without complications: Secondary | ICD-10-CM | POA: Insufficient documentation

## 2014-02-08 DIAGNOSIS — Z87891 Personal history of nicotine dependence: Secondary | ICD-10-CM | POA: Insufficient documentation

## 2014-02-08 DIAGNOSIS — K089 Disorder of teeth and supporting structures, unspecified: Secondary | ICD-10-CM | POA: Insufficient documentation

## 2014-02-08 DIAGNOSIS — R609 Edema, unspecified: Secondary | ICD-10-CM | POA: Insufficient documentation

## 2014-02-08 DIAGNOSIS — K029 Dental caries, unspecified: Secondary | ICD-10-CM | POA: Insufficient documentation

## 2014-02-08 MED ORDER — OXYCODONE-ACETAMINOPHEN 5-325 MG PO TABS: 1.0000 | ORAL_TABLET | ORAL | Status: DC | PRN

## 2014-02-08 MED ORDER — PENICILLIN V POTASSIUM 500 MG PO TABS: 500.0000 mg | ORAL_TABLET | Freq: Three times a day (TID) | ORAL | Status: DC

## 2014-02-08 MED ORDER — IBUPROFEN 800 MG PO TABS: 800.0000 mg | ORAL_TABLET | Freq: Three times a day (TID) | ORAL | Status: DC

## 2014-02-08 NOTE — Discharge Instructions (Signed)
Dental Care and Dentist Visits Dental care supports good overall health. Regular dental visits can also help you avoid dental pain, bleeding, infection, and other more serious health problems in the future. It is important to keep the mouth healthy because diseases in the teeth, gums, and other oral tissues can spread to other areas of the body. Some problems, such as diabetes, heart disease, and pre-term labor have been associated with poor oral health.  See your dentist every 6 months. If you experience emergency problems such as a toothache or broken tooth, go to the dentist right away. If you see your dentist regularly, you may catch problems early. It is easier to be treated for problems in the early stages.  WHAT TO EXPECT AT A DENTIST VISIT  Your dentist will look for many common oral health problems and recommend proper treatment. At your regular dental visit, you can expect:  Gentle cleaning of the teeth and gums. This includes scraping and polishing. This helps to remove the sticky substance around the teeth and gums (plaque). Plaque forms in the mouth shortly after eating. Over time, plaque hardens on the teeth as tartar. If tartar is not removed regularly, it can cause problems. Cleaning also helps remove stains.  Periodic X-rays. These pictures of the teeth and supporting bone will help your dentist assess the health of your teeth.  Periodic fluoride treatments. Fluoride is a natural mineral shown to help strengthen teeth. Fluoride treatmentinvolves applying a fluoride gel or varnish to the teeth. It is most commonly done in children.  Examination of the mouth, tongue, jaws, teeth, and gums to look for any oral health problems, such as:  Cavities (dental caries). This is decay on the tooth caused by plaque, sugar, and acid in the mouth. It is best to catch a cavity when it is small.  Inflammation of the gums caused by plaque buildup (gingivitis).  Problems with the mouth or malformed  or misaligned teeth.  Oral cancer or other diseases of the soft tissues or jaws. KEEP YOUR TEETH AND GUMS HEALTHY For healthy teeth and gums, follow these general guidelines as well as your dentist's specific advice:  Have your teeth professionally cleaned at the dentist every 6 months.  Brush twice daily with a fluoride toothpaste.  Floss your teeth daily.  Ask your dentist if you need fluoride supplements, treatments, or fluoride toothpaste.  Eat a healthy diet. Reduce foods and drinks with added sugar.  Avoid smoking. TREATMENT FOR ORAL HEALTH PROBLEMS If you have oral health problems, treatment varies depending on the conditions present in your teeth and gums.  Your caregiver will most likely recommend good oral hygiene at each visit.  For cavities, gingivitis, or other oral health disease, your caregiver will perform a procedure to treat the problem. This is typically done at a separate appointment. Sometimes your caregiver will refer you to another dental specialist for specific tooth problems or for surgery. SEEK IMMEDIATE DENTAL CARE IF:  You have pain, bleeding, or soreness in the gum, tooth, jaw, or mouth area.  A permanent tooth becomes loose or separated from the gum socket.  You experience a blow or injury to the mouth or jaw area. Document Released: 07/30/2011 Document Revised: 02/09/2012 Document Reviewed: 07/30/2011 [facility] Patient Information 2014 [place], [place].  Dental Caries  Dental caries (also called tooth decay) is the most common oral disease. It can occur at any age, but is more common in children and young adults.  HOW DENTAL CARIES DEVELOPS  The  process of decay begins when bacteria and foods (particularly sugars and starches) combine in your mouth to produce plaque. Plaque is a substance that sticks to the hard, outer surface of a tooth (enamel). The bacteria in plaque produce acids that attack enamel. These acids may also attack the root surface of  a tooth (cementum) if it is exposed. Repeated attacks dissolve these surfaces and create holes in the tooth (cavities). If left untreated, the acids destroy the other layers of the tooth.  RISK FACTORS  Frequent sipping of sugary beverages.   Frequent snacking on sugary and starchy foods, especially those that easily get stuck in the teeth.   Poor oral hygiene.   Dry mouth.   Substance abuse such as methamphetamine abuse.   Broken or poor-fitting dental restorations.   Eating disorders.   Gastroesophageal reflux disease (GERD).   Certain radiation treatments to the head and neck. SYMPTOMS In the early stages of dental caries, symptoms are seldom present. Sometimes white, chalky areas may be seen on the enamel or other tooth layers. In later stages, symptoms may include:  Pits and holes on the enamel.  Toothache after sweet, hot, or cold foods or drinks are consumed.  Pain around the tooth.  Swelling around the tooth. DIAGNOSIS  Most of the time, dental caries is detected during a regular dental checkup. A diagnosis is made after a thorough medical and dental history is taken and the surfaces of your teeth are checked for signs of dental caries. Sometimes special instruments, such as lasers, are used to check for dental caries. Dental X-ray exams may be taken so that areas not visible to the eye (such as between the contact areas of the teeth) can be checked for cavities.  TREATMENT  If dental caries is in its early stages, it may be reversed with a fluoride treatment or an application of a remineralizing agent at the dental office. Thorough brushing and flossing at home is needed to aid these treatments. If it is in its later stages, treatment depends on the location and extent of tooth destruction:   If a small area of the tooth has been destroyed, the destroyed area will be removed and cavities will be filled with a material such as gold, silver amalgam, or composite  resin.   If a large area of the tooth has been destroyed, the destroyed area will be removed and a cap (crown) will be fitted over the remaining tooth structure.   If the center part of the tooth (pulp) is affected, a procedure called a root canal will be needed before a filling or crown can be placed.   If most of the tooth has been destroyed, the tooth may need to be pulled (extracted). HOME CARE INSTRUCTIONS You can prevent, stop, or reverse dental caries at home by practicing good oral hygiene. Good oral hygiene includes:  Thoroughly cleaning your teeth at least twice a day with a toothbrush and dental floss.   Using a fluoride toothpaste. A fluoride mouth rinse may also be used if recommended by your dentist or health care provider.   Restricting the amount of sugary and starchy foods and sugary liquids you consume.   Avoiding frequent snacking on these foods and sipping of these liquids.   Keeping regular visits with a dentist for checkups and cleanings. PREVENTION   Practice good oral hygiene.  Consider a dental sealant. A dental sealant is a coating material that is applied by your dentist to the pits and grooves  of teeth. The sealant prevents food from being trapped in them. It may protect the teeth for several years.  Ask about fluoride supplements if you live in a community without fluorinated water or with water that has a low fluoride content. Use fluoride supplements as directed by your dentist or health care provider.  Allow fluoride varnish applications to teeth if directed by your dentist or health care provider. Document Released: 08/09/2002 Document Revised: 07/20/2013 Document Reviewed: 11/19/2012 [facility] Patient Information 2014 [place].

## 2014-02-08 NOTE — ED Notes (Signed)
Pt alert, arrives from home, c/o dental infection, onset a moth ago, denies treatment, presents with cont c/o, resp even unlabored, skin pwd

## 2014-02-08 NOTE — Progress Notes (Signed)
[facility] provided pt with a list of primary care resources and dental resources. Patient stated that she was pending insurance through job.

## 2014-02-08 NOTE — ED Provider Notes (Signed)
CSN: [id number]     Arrival date & time 02/08/14  1434 History  This chart was scribed for non-physician practitioner Charlann Lange working with Tanna Furry, MD by Mercy Moore, ED Scribe. This patient was seen in room WTR6/WTR6 and the patient's care was started at 3:16 PM.   Chief Complaint  Patient presents with  . Dental Problem      The history is provided by the patient. No language interpreter was used.   HPI Comments: Frances Mccormick is a 28 y.o. female who presents to the Emergency Department complaining of left sided dental pain and facial swelling. Patient reports that she has a broken tooth that needs to pulled that has begun aggravating her. Patient reports no difficulty swallowing but notes pain with chewing. Patient has been treating her pain with Extra Strength Tylenol, with no relief. Patient denies drainage and fever.Patient shares that she does plan to visit a dentist now that she has insurance. Patient is reports history of tobacco use, but has quite smoking for 7 months now. Patient is allergic to Cipro and Vicodin.  Past Medical History  Diagnosis Date  . Crohn's disease    Past Surgical History  Procedure Laterality Date  . Induced abortion     History reviewed. No pertinent family history. History  Substance Use Topics  . Smoking status: Never Smoker   . Smokeless tobacco: Not on file  . Alcohol Use: Yes     Comment: ocassionally   OB History   Grav Para Term Preterm Abortions TAB SAB Ect Mult Living                 Review of Systems  Constitutional: Negative for fever.  HENT: Positive for dental problem and facial swelling.       Allergies  Bee venom; Ciprofloxacin; and Vicodin  Home Medications   Current Outpatient Rx  Name  Route  Sig  Dispense  Refill  . Acetaminophen (TYLENOL ARTHRITIS EXT RELIEF PO)   Oral   Take 1-2 tablets by mouth every 6 (six) hours as needed (pain).          . ondansetron (ZOFRAN) 4 MG tablet   Oral   Take 1  tablet (4 mg total) by mouth every 6 (six) hours.   12 tablet   0    BP 130/80  Pulse 60  Temp(Src) 98 F (36.7 C) (Oral)  Resp 16  Wt 160 lb (72.576 kg)  SpO2 99%  LMP 02/08/2014 Physical Exam  Nursing note and vitals reviewed. Constitutional: She is oriented to person, place, and time. She appears well-developed and well-nourished. No distress.  HENT:  Head: Normocephalic and atraumatic.  Marked decay of #31 without any surrounding swelling or obvious abscess. Generally good dentition otherwise. No adenopathy. Oropharynx is benign.   Eyes: EOM are normal.  Neck: Neck supple. No tracheal deviation present.  Cardiovascular: Normal rate.   Pulmonary/Chest: Effort normal. No respiratory distress.  Musculoskeletal: Normal range of motion.  Neurological: She is alert and oriented to person, place, and time.  Skin: Skin is warm and dry.  Psychiatric: She has a normal mood and affect. Her behavior is normal.    ED Course  Procedures (including critical care time) DIAGNOSTIC STUDIES: Oxygen Saturation is 99% on room air, normal by my interpretation.    COORDINATION OF CARE: 3:19 PM- Will prescribe antibiotic and pain medication. Advised patient to manage pain with Ibuprofen at home until she is able to visit her denist. Pt advised  of plan for treatment and pt agrees.    Labs Review Labs Reviewed - No data to display Imaging Review No results found.   EKG Interpretation None      MDM   Final diagnoses:  None    1. Dental pain  Uncomplicated dental pain from decay.  I personally performed the services described in this documentation, which was scribed in my presence. The recorded information has been reviewed and is accurate.     Dewaine Oats, PA-C 02/11/14 [phone]

## 2014-02-11 NOTE — ED Provider Notes (Signed)
Medical screening examination/treatment/procedure(s) were performed by non-physician practitioner and as supervising physician I was immediately available for consultation/collaboration.   EKG Interpretation None        Tanna Furry, MD 02/11/14 [phone]

## 2014-03-06 ENCOUNTER — Emergency Department (HOSPITAL_COMMUNITY)
Admission: EM | Admit: 2014-03-06 | Discharge: 2014-03-06 | Disposition: A | Discharge status: Home / Self Care | Payer: BC Managed Care – PPO | Attending: Emergency Medicine | Admitting: Emergency Medicine

## 2014-03-06 ENCOUNTER — Encounter (HOSPITAL_COMMUNITY): Payer: Self-pay | Admitting: Emergency Medicine

## 2014-03-06 DIAGNOSIS — R6883 Chills (without fever): Secondary | ICD-10-CM | POA: Insufficient documentation

## 2014-03-06 DIAGNOSIS — IMO0001 Reserved for inherently not codable concepts without codable children: Secondary | ICD-10-CM | POA: Insufficient documentation

## 2014-03-06 DIAGNOSIS — R112 Nausea with vomiting, unspecified: Secondary | ICD-10-CM | POA: Insufficient documentation

## 2014-03-06 DIAGNOSIS — M79609 Pain in unspecified limb: Secondary | ICD-10-CM

## 2014-03-06 DIAGNOSIS — M7989 Other specified soft tissue disorders: Secondary | ICD-10-CM

## 2014-03-06 DIAGNOSIS — R109 Unspecified abdominal pain: Secondary | ICD-10-CM

## 2014-03-06 DIAGNOSIS — R609 Edema, unspecified: Secondary | ICD-10-CM | POA: Insufficient documentation

## 2014-03-06 DIAGNOSIS — R197 Diarrhea, unspecified: Secondary | ICD-10-CM | POA: Insufficient documentation

## 2014-03-06 DIAGNOSIS — K5090 Crohn's disease, unspecified, without complications: Secondary | ICD-10-CM

## 2014-03-06 LAB — COMPREHENSIVE METABOLIC PANEL
ALBUMIN: 3.9 g/dL (ref 3.5–5.2)
ALT: 10 U/L (ref 0–35)
AST: 20 U/L (ref 0–37)
Alkaline Phosphatase: 68 U/L (ref 39–117)
BUN: 8 mg/dL (ref 6–23)
CO2: 22 mEq/L (ref 19–32)
CREATININE: 0.76 mg/dL (ref 0.50–1.10)
Calcium: 9.1 mg/dL (ref 8.4–10.5)
Chloride: 103 mEq/L (ref 96–112)
GFR calc Af Amer: >90 mL/min (ref >90)
GFR calc non Af Amer: >90 mL/min (ref >90)
Glucose, Bld: 89 mg/dL (ref 70–99)
Potassium: 3.7 mEq/L (ref 3.7–5.3)
Sodium: 139 mEq/L (ref 137–147)
Total Bilirubin: 0.3 mg/dL (ref 0.3–1.2)
Total Protein: 7.6 g/dL (ref 6.0–8.3)

## 2014-03-06 LAB — URINE MICROSCOPIC-ADD ON

## 2014-03-06 LAB — CBC WITH DIFFERENTIAL/PLATELET
BASOS ABS: 0.1 10*3/uL (ref 0.0–0.1)
BASOS PCT: 0 % (ref 0–1)
EOS ABS: 0.2 10*3/uL (ref 0.0–0.7)
EOS PCT: 2 % (ref 0–5)
HCT: 40.2 % (ref 36.0–46.0)
Hemoglobin: 13.6 g/dL (ref 12.0–15.0)
Lymphocytes Relative: 14 % (ref 12–46)
Lymphs Abs: 1.9 10*3/uL (ref 0.7–4.0)
MCH: 26 pg (ref 26.0–34.0)
MCHC: 33.8 g/dL (ref 30.0–36.0)
MCV: 76.9 fL — AB (ref 78.0–100.0)
Monocytes Absolute: 0.7 10*3/uL (ref 0.1–1.0)
Monocytes Relative: 5 % (ref 3–12)
Neutro Abs: 10.5 10*3/uL — ABNORMAL HIGH (ref 1.7–7.7)
Neutrophils Relative %: 79 % — ABNORMAL HIGH (ref 43–77)
PLATELETS: 342 10*3/uL (ref 150–400)
RBC: 5.23 MIL/uL — ABNORMAL HIGH (ref 3.87–5.11)
RDW: 15.4 % (ref 11.5–15.5)
WBC: 13.3 10*3/uL — ABNORMAL HIGH (ref 4.0–10.5)

## 2014-03-06 LAB — URINALYSIS, ROUTINE W REFLEX MICROSCOPIC
BILIRUBIN URINE: NEGATIVE
Glucose, UA: NEGATIVE mg/dL
KETONES UR: 15 mg/dL — AB
Leukocytes, UA: NEGATIVE
Nitrite: NEGATIVE
Protein, ur: NEGATIVE mg/dL
Specific Gravity, Urine: 1.024 (ref 1.005–1.030)
Urobilinogen, UA: 0.2 mg/dL (ref 0.0–1.0)
pH: 7 (ref 5.0–8.0)

## 2014-03-06 LAB — LIPASE, BLOOD: Lipase: 21 U/L (ref 11–59)

## 2014-03-06 MED ORDER — SODIUM CHLORIDE 0.9 % IV BOLUS (SEPSIS)
1000.0000 mL | Freq: Once | INTRAVENOUS | Status: AC
  Administered 2014-03-06: 1000 mL via INTRAVENOUS

## 2014-03-06 MED ORDER — MORPHINE SULFATE 4 MG/ML IJ SOLN
4.0000 mg | Freq: Once | INTRAMUSCULAR | Status: AC
  Administered 2014-03-06: 4 mg via INTRAVENOUS
  Filled 2014-03-06: qty 1

## 2014-03-06 MED ORDER — PREDNISONE 20 MG PO TABS: 40.0000 mg | ORAL_TABLET | Freq: Every day | ORAL | Status: DC

## 2014-03-06 MED ORDER — METHYLPREDNISOLONE SODIUM SUCC 125 MG IJ SOLR
125.0000 mg | Freq: Once | INTRAMUSCULAR | Status: AC
  Administered 2014-03-06: 125 mg via INTRAVENOUS
  Filled 2014-03-06: qty 2

## 2014-03-06 MED ORDER — ONDANSETRON HCL 4 MG PO TABS: 4.0000 mg | ORAL_TABLET | Freq: Four times a day (QID) | ORAL | Status: DC

## 2014-03-06 MED ORDER — ONDANSETRON HCL 4 MG/2ML IJ SOLN
4.0000 mg | Freq: Once | INTRAMUSCULAR | Status: AC
  Administered 2014-03-06: 4 mg via INTRAVENOUS
  Filled 2014-03-06: qty 2

## 2014-03-06 MED ORDER — OXYCODONE-ACETAMINOPHEN 5-325 MG PO TABS: 1.0000 | ORAL_TABLET | Freq: Four times a day (QID) | ORAL | Status: DC | PRN

## 2014-03-06 NOTE — Discharge Instructions (Signed)
Abdominal Pain, Women °Abdominal (stomach, pelvic, or belly) pain can be caused by many things. It is important to tell your doctor: °· The location of the pain. °· Does it come and go or is it present all the time? °· Are there things that start the pain (eating certain foods, exercise)? °· Are there other symptoms associated with the pain (fever, nausea, vomiting, diarrhea)? °All of this is helpful to know when trying to find the cause of the pain. °CAUSES  °· Stomach: virus or bacteria infection, or ulcer. °· Intestine: appendicitis (inflamed appendix), regional ileitis (Crohn's disease), ulcerative colitis (inflamed colon), irritable bowel syndrome, diverticulitis (inflamed diverticulum of the colon), or cancer of the stomach or intestine. °· Gallbladder disease or stones in the gallbladder. °· Kidney disease, kidney stones, or infection. °· Pancreas infection or cancer. °· Fibromyalgia (pain disorder). °· Diseases of the female organs: °· Uterus: fibroid (non-cancerous) tumors or infection. °· Fallopian tubes: infection or tubal pregnancy. °· Ovary: cysts or tumors. °· Pelvic adhesions (scar tissue). °· Endometriosis (uterus lining tissue growing in the pelvis and on the pelvic organs). °· Pelvic congestion syndrome (female organs filling up with blood just before the menstrual period). °· Pain with the menstrual period. °· Pain with ovulation (producing an egg). °· Pain with an IUD (intrauterine device, birth control) in the uterus. °· Cancer of the female organs. °· Functional pain (pain not caused by a disease, may improve without treatment). °· Psychological pain. °· Depression. °DIAGNOSIS  °Your doctor will decide the seriousness of your pain by doing an examination. °· Blood tests. °· X-rays. °· Ultrasound. °· CT scan (computed tomography, special type of X-ray). °· MRI (magnetic resonance imaging). °· Cultures, for infection. °· Barium enema (dye inserted in the large intestine, to better view it with  X-rays). °· Colonoscopy (looking in intestine with a lighted tube). °· Laparoscopy (minor surgery, looking in abdomen with a lighted tube). °· Major abdominal exploratory surgery (looking in abdomen with a large incision). °TREATMENT  °The treatment will depend on the cause of the pain.  °· Many cases can be observed and treated at home. °· Over-the-counter medicines recommended by your caregiver. °· Prescription medicine. °· Antibiotics, for infection. °· Birth control pills, for painful periods or for ovulation pain. °· Hormone treatment, for endometriosis. °· Nerve blocking injections. °· Physical therapy. °· Antidepressants. °· Counseling with a psychologist or psychiatrist. °· Minor or major surgery. °HOME CARE INSTRUCTIONS  °· Do not take laxatives, unless directed by your caregiver. °· Take over-the-counter pain medicine only if ordered by your caregiver. Do not take aspirin because it can cause an upset stomach or bleeding. °· Try a clear liquid diet (broth or water) as ordered by your caregiver. Slowly move to a bland diet, as tolerated, if the pain is related to the stomach or intestine. °· Have a thermometer and take your temperature several times a day, and record it. °· Bed rest and sleep, if it helps the pain. °· Avoid sexual intercourse, if it causes pain. °· Avoid stressful situations. °· Keep your follow-up appointments and tests, as your caregiver orders. °· If the pain does not go away with medicine or surgery, you may try: °· Acupuncture. °· Relaxation exercises (yoga, meditation). °· Group therapy. °· Counseling. °SEEK MEDICAL CARE IF:  °· You notice certain foods cause stomach pain. °· Your home care treatment is not helping your pain. °· You need stronger pain medicine. °· You want your IUD removed. °· You feel faint or   lightheaded.  You develop nausea and vomiting.  You develop a rash.  You are having side effects or an allergy to your medicine. SEEK IMMEDIATE MEDICAL CARE IF:   Your  pain does not go away or gets worse.  You have a fever.  Your pain is felt only in portions of the abdomen. The right side could possibly be appendicitis. The left lower portion of the abdomen could be colitis or diverticulitis.  You are passing blood in your stools (bright red or black tarry stools, with or without vomiting).  You have blood in your urine.  You develop chills, with or without a fever.  You pass out. MAKE SURE YOU:   Understand these instructions.  Will watch your condition.  Will get help right away if you are not doing well or get worse. Document Released: 09/14/2007 Document Revised: 02/09/2012 Document Reviewed: 10/04/2009 [facility] Patient Information 2014 [place], [place].  Crohn's Disease Crohn's disease is a long-term (chronic) soreness and redness (inflammation) of the intestines (bowel). It can affect any portion of the digestive tract, from the mouth to the anus. It can also cause problems outside the digestive tract. Crohn's disease is closely related to a disease called ulcerative colitis (together, these two diseases are called inflammatory bowel disease).  CAUSES  The cause of Crohn's disease is not known. One Link Snuffer is that, in an easily affected person, the immune system is triggered to attack the body's own digestive tissue. Crohn's disease runs in families. It seems to be more common in certain geographic areas and amongst certain races. There are no clear-cut dietary causes.  SYMPTOMS  Crohn's disease can cause many different symptoms since it can affect many different parts of the body. Symptoms include:  Fatigue.  Weight loss.  Chronic diarrhea, sometime bloody.  Abdominal pain and cramps.  Fever.  Ulcers or canker sores in the mouth or rectum.  Anemia (low red blood cells).  Arthritis, skin problems, and eye problems may occur. Complications of Crohn's disease can include:  Series of holes (perforation) of the bowel.  Portions of  the intestines sticking to each other (adhesions).  Obstruction of the bowel.  Fistula formation, typically in the rectal area but also in other areas. A fistula is an opening between the bowels and the outside, or between the bowels and another organ.  A painful crack in the mucous membrane of the anus (rectal fissure). DIAGNOSIS  Your caregiver may suspect Crohn's disease based on your symptoms and an exam. Blood tests may confirm that there is a problem. You may be asked to submit a stool specimen for examination. X-rays and CT scans may be necessary. Ultimately, the diagnosis is usually made after a procedure that uses a flexible tube that is inserted via your mouth or your anus. This is done under sedation and is called either an upper endoscopy or colonoscopy. With these tests, the specialist can take tiny tissue samples and remove them from the inside of the bowel (biopsy). Examination of this biopsy tissue under a microscope can reveal Crohn's disease as the cause of your symptoms. Due to the many different forms that Crohn's disease can take, symptoms may be present for several years before a diagnosis is made. TREATMENT  Medications are often used to decrease inflammation and control the immune system. These include medicines related to aspirin, steroid medications, and newer and stronger medications to slow down the immune system. Some medications may be used as suppositories or enemas. A number of other medications  are used or have been studied. Your caregiver will make specific recommendations. HOME CARE INSTRUCTIONS   Symptoms such as diarrhea can be controlled with medications. Avoid foods that have a laxative effect such as fresh fruit, vegetables and dairy products. During flare ups, you can rest your bowel by refraining from solid foods. Drink clear liquids frequently during the day (electrolyte or re-hydrating fluids are best. Your caregiver can help you with suggestions). Drink  often to prevent loss of body fluids (dehydration). When diarrhea has cleared, eat small meals and more frequently. Avoid food additives and stimulants such as caffeine (coffee, tea, or chocolate). Enzyme supplements may help if you develop intolerance to a sugar in dairy products (lactose). Ask your caregiver or dietitian about specific dietary instructions.  Try to maintain a positive attitude. Learn relaxation techniques such as self hypnosis, mental imaging, and muscle relaxation.  If possible, avoid stresses which can aggravate your condition.  Exercise regularly.  Follow your diet.  Always get plenty of rest. SEEK MEDICAL CARE IF:   Your symptoms fail to improve after a week or two of new treatment.  You experience continued weight loss.  You have ongoing cramps or loose bowels.  You develop a new skin rash, skin sores, or eye problems. SEEK IMMEDIATE MEDICAL CARE IF:   You have worsening of your symptoms or develop new symptoms.  You have a fever.  You develop bloody diarrhea.  You develop severe abdominal pain. MAKE SURE YOU:   Understand these instructions.  Will watch your condition.  Will get help right away if you are not doing well or get worse. Document Released: 08/27/2005 Document Revised: 03/14/2013 Document Reviewed: 07/26/2007 [facility] Patient Information 2014 [place], [place].

## 2014-03-06 NOTE — ED Notes (Signed)
Pt has crohn's dz states today started having v/d, pain in stomach and back. Pt also states he legs have started to swell and has never happened before. Started menstrual cycle early this morning.

## 2014-03-06 NOTE — ED Notes (Signed)
Initial Contact - pt to [place] with family, changed to hospital gown.  Pt reports hx Crohn's, started this AM with lower abd pain 9/10 and low back pain.  Pt reports +nausea.  Pt also reports LE swelling x3 days with no hx of similar.  Pt denies cp/sob.  Speaking full/clear sentences, rr even/un-lab.  Ambulatory with steady gait.  Skin PWD.  NAD.

## 2014-03-06 NOTE — ED Provider Notes (Signed)
CSN: [id number]     Arrival date & time 03/06/14  1259 History   First MD Initiated Contact with Patient 03/06/14 1323     Chief Complaint  Patient presents with  . Emesis  . Diarrhea  . Leg Swelling     (Consider location/radiation/quality/duration/timing/severity/associated sxs/prior Treatment) HPI Comments: Patient is a 28 year old female with history of Crohn's disease who presents today with lower abdominal cramping. She reports that around 3 AM today she began to have lower abdominal pain, nausea, vomiting, diarrhea. She states that this feels like her Crohn's flare. She sees Dr. Collene Mares as her gastroenterologist, but has not seen her in a year due to insurance issues. She now has insurance and is planning on scheduling and appointment. She began her menstrual cycle this morning. Her current menstrual cycle it is heavier than normal. Additionally she reports that she has been having right leg swelling since yesterday. This has never happened to her in the past. She denies any recent surgeries, long trips, history of DVT or PE, or exogenous estrogen. The pain is an aching type pain and is worse with ambulation and palpation.  The history is provided by the patient. No language interpreter was used.    Past Medical History  Diagnosis Date  . Crohn's disease    Past Surgical History  Procedure Laterality Date  . Induced abortion     No family history on file. History  Substance Use Topics  . Smoking status: Never Smoker   . Smokeless tobacco: Not on file  . Alcohol Use: Yes     Comment: ocassionally   OB History   Grav Para Term Preterm Abortions TAB SAB Ect Mult Living                 Review of Systems  Constitutional: Positive for chills. Negative for fever.  Respiratory: Negative for shortness of breath.   Cardiovascular: Positive for leg swelling. Negative for chest pain.  Gastrointestinal: Positive for nausea, vomiting, abdominal pain and diarrhea.  Musculoskeletal:  Positive for myalgias.  All other systems reviewed and are negative.      Allergies  Bee venom; Ciprofloxacin; and Vicodin  Home Medications   Current Outpatient Rx  Name  Route  Sig  Dispense  Refill  . Acetaminophen (TYLENOL ARTHRITIS EXT RELIEF PO)   Oral   Take 1-2 tablets by mouth every 6 (six) hours as needed (pain).          Marland Kitchen ibuprofen (ADVIL,MOTRIN) 800 MG tablet   Oral   Take 1 tablet (800 mg total) by mouth 3 (three) times daily.   21 tablet   0   . ondansetron (ZOFRAN) 4 MG tablet   Oral   Take 1 tablet (4 mg total) by mouth every 6 (six) hours.   12 tablet   0   . oxyCODONE-acetaminophen (PERCOCET/ROXICET) 5-325 MG per tablet   Oral   Take 1-2 tablets by mouth every 4 (four) hours as needed for severe pain.   15 tablet   0   . penicillin v potassium (VEETID) 500 MG tablet   Oral   Take 1 tablet (500 mg total) by mouth 3 (three) times daily.   30 tablet   0    BP 109/69  Pulse 78  Temp(Src) 98.8 F (37.1 C) (Oral)  Resp 17  SpO2 98%  LMP 03/06/2014 Physical Exam  Nursing note and vitals reviewed. Constitutional: She is oriented to person, place, and time. She appears well-developed and well-nourished. No  distress.  Well appearing, nad  HENT:  Head: Normocephalic and atraumatic.  Right Ear: External ear normal.  Left Ear: External ear normal.  Nose: Nose normal.  Mouth/Throat: Oropharynx is clear and moist.  Eyes: Conjunctivae are normal.  Neck: Normal range of motion.  No nuchal rigidity or meningeal signs  Cardiovascular: Normal rate, regular rhythm, normal heart sounds, intact distal pulses and normal pulses.   Pulses:      Posterior tibial pulses are 2+ on the right side, and 2+ on the left side.  Tenderness to palpation over right calf. No erythema, induration, fluctuance seen. Very mild amount of swelling compared to left leg.   Pulmonary/Chest: Effort normal and breath sounds normal. No stridor. No respiratory distress. She has no  wheezes. She has no rales.  Abdominal: Soft. Bowel sounds are normal. She exhibits no distension. There is tenderness in the right lower quadrant, periumbilical area, suprapubic area and left lower quadrant. There is no rigidity, no rebound and no guarding.    Musculoskeletal: Normal range of motion.  Neurological: She is alert and oriented to person, place, and time. She has normal strength.  Skin: Skin is warm and dry. She is not diaphoretic. No erythema.  Psychiatric: She has a normal mood and affect. Her behavior is normal.    ED Course  Procedures (including critical care time) Labs Review Labs Reviewed  CBC WITH DIFFERENTIAL - Abnormal; Notable for the following:    WBC 13.3 (*)    RBC 5.23 (*)    MCV 76.9 (*)    Neutrophils Relative % 79 (*)    Neutro Abs 10.5 (*)    All other components within normal limits  URINALYSIS, ROUTINE W REFLEX MICROSCOPIC - Abnormal; Notable for the following:    Color, Urine AMBER (*)    Hgb urine dipstick MODERATE (*)    Ketones, ur 15 (*)    All other components within normal limits  URINE MICROSCOPIC-ADD ON - Abnormal; Notable for the following:    Bacteria, UA FEW (*)    All other components within normal limits  URINE CULTURE  COMPREHENSIVE METABOLIC PANEL  LIPASE, BLOOD   Imaging Review No results found.   EKG Interpretation None      MDM   Final diagnoses:  Exacerbation of Crohn's disease  Abdominal pain    Patient presents to ED for evaluation of abdominal pain with n/v/d. Feels like her typical Crohn's exacerbation. She is not on any home meds currently because she has not seen GI in a year. I discussed this case with Dr. Collene Mares who reveals that patient is in fact a patient of Dr. Ulyses Amor who was discharged from the practice for noncompliance. She recommends that I start the patient on a prednisone burst and have patient call to schedule and appointment. Patient given solumedrol in ED and discharged with prednisone. Patient  feels improved on repeat abdominal exam and abdomen remains soft and non surgical. I do not feel further imaging is required at this time. Additionally patient complains of right leg swelling. [place] negative for DVT. Discussed case with Dr. Tomi Bamberger who agrees with plan. Return instructions given. Vital signs stable for discharge. Jolaine Artist, PA-C 03/06/14 1601

## 2014-03-06 NOTE — Progress Notes (Signed)
Right lower extremity venous duplex completed.  Right:  No evidence of DVT, superficial thrombosis, or Baker's cyst.  Left:  Negative for DVT in the common femoral vein.  

## 2014-03-07 LAB — URINE CULTURE
CULTURE: NO GROWTH
Colony Count: NO GROWTH

## 2014-03-07 NOTE — ED Provider Notes (Signed)
Medical screening examination/treatment/procedure(s) were performed by non-physician practitioner and as supervising physician I was immediately available for consultation/collaboration.   Kathalene Frames, MD 03/07/14 [phone]

## 2014-07-08 ENCOUNTER — Emergency Department (HOSPITAL_COMMUNITY): Payer: BC Managed Care – PPO

## 2014-07-08 ENCOUNTER — Emergency Department (HOSPITAL_COMMUNITY)
Admission: EM | Admit: 2014-07-08 | Discharge: 2014-07-09 | Disposition: A | Discharge status: Home / Self Care | Payer: Self-pay | Attending: Emergency Medicine | Admitting: Emergency Medicine

## 2014-07-08 ENCOUNTER — Encounter (HOSPITAL_COMMUNITY): Payer: Self-pay | Admitting: Emergency Medicine

## 2014-07-08 DIAGNOSIS — R109 Unspecified abdominal pain: Secondary | ICD-10-CM

## 2014-07-08 DIAGNOSIS — R1013 Epigastric pain: Secondary | ICD-10-CM | POA: Insufficient documentation

## 2014-07-08 DIAGNOSIS — R1031 Right lower quadrant pain: Secondary | ICD-10-CM | POA: Insufficient documentation

## 2014-07-08 DIAGNOSIS — R112 Nausea with vomiting, unspecified: Secondary | ICD-10-CM | POA: Insufficient documentation

## 2014-07-08 DIAGNOSIS — Z3202 Encounter for pregnancy test, result negative: Secondary | ICD-10-CM | POA: Insufficient documentation

## 2014-07-08 DIAGNOSIS — Z8719 Personal history of other diseases of the digestive system: Secondary | ICD-10-CM | POA: Insufficient documentation

## 2014-07-08 DIAGNOSIS — R1033 Periumbilical pain: Secondary | ICD-10-CM | POA: Insufficient documentation

## 2014-07-08 LAB — COMPREHENSIVE METABOLIC PANEL
ALT: 10 U/L (ref 0–35)
AST: 18 U/L (ref 0–37)
Albumin: 3.9 g/dL (ref 3.5–5.2)
Alkaline Phosphatase: 67 U/L (ref 39–117)
Anion gap: 14 (ref 5–15)
BUN: 4 mg/dL — ABNORMAL LOW (ref 6–23)
CO2: 24 meq/L (ref 19–32)
Calcium: 9.1 mg/dL (ref 8.4–10.5)
Chloride: 102 mEq/L (ref 96–112)
Creatinine, Ser: 0.75 mg/dL (ref 0.50–1.10)
GFR calc Af Amer: >90 mL/min (ref >90)
GLUCOSE: 112 mg/dL — AB (ref 70–99)
Potassium: 3.4 mEq/L — ABNORMAL LOW (ref 3.7–5.3)
SODIUM: 140 meq/L (ref 137–147)
Total Bilirubin: 0.3 mg/dL (ref 0.3–1.2)
Total Protein: 7.8 g/dL (ref 6.0–8.3)

## 2014-07-08 LAB — RAPID URINE DRUG SCREEN, HOSP PERFORMED
AMPHETAMINES: NOT DETECTED
Barbiturates: NOT DETECTED
Benzodiazepines: NOT DETECTED
Cocaine: NOT DETECTED
OPIATES: POSITIVE — AB
TETRAHYDROCANNABINOL: POSITIVE — AB

## 2014-07-08 LAB — POC URINE PREG, ED: Preg Test, Ur: NEGATIVE

## 2014-07-08 LAB — CBC WITH DIFFERENTIAL/PLATELET
Basophils Absolute: 0 10*3/uL (ref 0.0–0.1)
Basophils Relative: 0 % (ref 0–1)
EOS PCT: 1 % (ref 0–5)
Eosinophils Absolute: 0.1 10*3/uL (ref 0.0–0.7)
HCT: 38.9 % (ref 36.0–46.0)
HEMOGLOBIN: 12.9 g/dL (ref 12.0–15.0)
LYMPHS ABS: 2.2 10*3/uL (ref 0.7–4.0)
LYMPHS PCT: 11 % — AB (ref 12–46)
MCH: 26.2 pg (ref 26.0–34.0)
MCHC: 33.2 g/dL (ref 30.0–36.0)
MCV: 79.1 fL (ref 78.0–100.0)
Monocytes Absolute: 0.9 10*3/uL (ref 0.1–1.0)
Monocytes Relative: 5 % (ref 3–12)
Neutro Abs: 16.7 10*3/uL — ABNORMAL HIGH (ref 1.7–7.7)
Neutrophils Relative %: 83 % — ABNORMAL HIGH (ref 43–77)
PLATELETS: 296 10*3/uL (ref 150–400)
RBC: 4.92 MIL/uL (ref 3.87–5.11)
RDW: 15 % (ref 11.5–15.5)
WBC: 19.9 10*3/uL — AB (ref 4.0–10.5)

## 2014-07-08 LAB — URINALYSIS, ROUTINE W REFLEX MICROSCOPIC
BILIRUBIN URINE: NEGATIVE
GLUCOSE, UA: NEGATIVE mg/dL
KETONES UR: NEGATIVE mg/dL
Leukocytes, UA: NEGATIVE
Nitrite: NEGATIVE
PROTEIN: NEGATIVE mg/dL
Specific Gravity, Urine: 1.01 (ref 1.005–1.030)
Urobilinogen, UA: 0.2 mg/dL (ref 0.0–1.0)
pH: 7 (ref 5.0–8.0)

## 2014-07-08 LAB — ETHANOL: Alcohol, Ethyl (B): <11 mg/dL (ref 0–11)

## 2014-07-08 LAB — URINE MICROSCOPIC-ADD ON

## 2014-07-08 LAB — ACETAMINOPHEN LEVEL: Acetaminophen (Tylenol), Serum: <15 ug/mL (ref 10–30)

## 2014-07-08 LAB — LIPASE, BLOOD: Lipase: 19 U/L (ref 11–59)

## 2014-07-08 LAB — SALICYLATE LEVEL: Salicylate Lvl: <2 mg/dL — ABNORMAL LOW (ref 2.8–20.0)

## 2014-07-08 MED ORDER — OXYCODONE-ACETAMINOPHEN 5-325 MG PO TABS: 1.0000 | ORAL_TABLET | ORAL | Status: DC | PRN

## 2014-07-08 MED ORDER — ONDANSETRON HCL 4 MG/2ML IJ SOLN
4.0000 mg | Freq: Once | INTRAMUSCULAR | Status: AC
  Administered 2014-07-08: 4 mg via INTRAVENOUS
  Filled 2014-07-08: qty 2

## 2014-07-08 MED ORDER — MORPHINE SULFATE 4 MG/ML IJ SOLN
4.0000 mg | Freq: Once | INTRAMUSCULAR | Status: AC
  Administered 2014-07-08: 4 mg via INTRAVENOUS
  Filled 2014-07-08: qty 1

## 2014-07-08 MED ORDER — PROMETHAZINE HCL 25 MG/ML IJ SOLN
25.0000 mg | Freq: Four times a day (QID) | INTRAMUSCULAR | Status: DC | PRN
  Administered 2014-07-08: 25 mg via INTRAVENOUS
  Filled 2014-07-08: qty 1

## 2014-07-08 MED ORDER — PROMETHAZINE HCL 25 MG PO TABS: 25.0000 mg | ORAL_TABLET | Freq: Four times a day (QID) | ORAL | Status: DC | PRN

## 2014-07-08 MED ORDER — SODIUM CHLORIDE 0.9 % IV BOLUS (SEPSIS)
1000.0000 mL | Freq: Once | INTRAVENOUS | Status: AC
  Administered 2014-07-08: 1000 mL via INTRAVENOUS

## 2014-07-08 MED ORDER — IOHEXOL 300 MG/ML  SOLN
50.0000 mL | Freq: Once | INTRAMUSCULAR | Status: AC | PRN
  Administered 2014-07-08: 50 mL via ORAL

## 2014-07-08 MED ORDER — IOHEXOL 300 MG/ML  SOLN
100.0000 mL | Freq: Once | INTRAMUSCULAR | Status: AC | PRN
  Administered 2014-07-08: 100 mL via INTRAVENOUS

## 2014-07-08 NOTE — ED Notes (Signed)
Mother sts pt experience thought of suicde but pt denies this.  Mother tearful yet consolable.

## 2014-07-08 NOTE — Discharge Instructions (Signed)
Abdominal Pain Many things can cause abdominal pain. Usually, abdominal pain is not caused by a disease and will improve without treatment. It can often be observed and treated at home. Your health care provider will do a physical exam and possibly order blood tests and X-rays to help determine the seriousness of your pain. However, in many cases, more time must pass before a clear cause of the pain can be found. Before that point, your health care provider may not know if you need more testing or further treatment. HOME CARE INSTRUCTIONS  Monitor your abdominal pain for any changes. The following actions may help to alleviate any discomfort you are experiencing:  Only take over-the-counter or prescription medicines as directed by your health care provider.  Do not take laxatives unless directed to do so by your health care provider.  Try a clear liquid diet (broth, tea, or water) as directed by your health care provider. Slowly move to a bland diet as tolerated. SEEK MEDICAL CARE IF:  You have unexplained abdominal pain.  You have abdominal pain associated with nausea or diarrhea.  You have pain when you urinate or have a bowel movement.  You experience abdominal pain that wakes you in the night.  You have abdominal pain that is worsened or improved by eating food.  You have abdominal pain that is worsened with eating fatty foods.  You have a fever. SEEK IMMEDIATE MEDICAL CARE IF:   Your pain does not go away within 2 hours.  You keep throwing up (vomiting).  Your pain is felt only in portions of the abdomen, such as the right side or the left lower portion of the abdomen.  You pass bloody or black tarry stools. MAKE SURE YOU:  Understand these instructions.   Will watch your condition.   Will get help right away if you are not doing well or get worse.  Document Released: 08/27/2005 Document Revised: 11/22/2013 Document Reviewed: 07/27/2013 [employer] Patient Information  2015 [place], [place]. This information is not intended to replace advice given to you by your health care provider. Make sure you discuss any questions you have with your health care provider.    Nausea and Vomiting Nausea is a sick feeling that often comes before throwing up (vomiting). Vomiting is a reflex where stomach contents come out of your mouth. Vomiting can cause severe loss of body fluids (dehydration). Children and elderly adults can become dehydrated quickly, especially if they also have diarrhea. Nausea and vomiting are symptoms of a condition or disease. It is important to find the cause of your symptoms. CAUSES   Direct irritation of the stomach lining. This irritation can result from increased acid production (gastroesophageal reflux disease), infection, food poisoning, taking certain medicines (such as nonsteroidal anti-inflammatory drugs), alcohol use, or tobacco use.  Signals from the brain.These signals could be caused by a headache, heat exposure, an inner ear disturbance, increased pressure in the brain from injury, infection, a tumor, or a concussion, pain, emotional stimulus, or metabolic problems.  An obstruction in the gastrointestinal tract (bowel obstruction).  Illnesses such as diabetes, hepatitis, gallbladder problems, appendicitis, kidney problems, cancer, sepsis, atypical symptoms of a heart attack, or eating disorders.  Medical treatments such as chemotherapy and radiation.  Receiving medicine that makes you sleep (general anesthetic) during surgery. DIAGNOSIS Your caregiver may ask for tests to be done if the problems do not improve after a few days. Tests may also be done if symptoms are severe or if the reason for  the nausea and vomiting is not clear. Tests may include:  Urine tests.  Blood tests.  Stool tests.  Cultures (to look for evidence of infection).  X-rays or other imaging studies. Test results can help your caregiver make decisions about  treatment or the need for additional tests. TREATMENT You need to stay well hydrated. Drink frequently but in small amounts.You may wish to drink water, sports drinks, clear broth, or eat frozen ice pops or gelatin dessert to help stay hydrated.When you eat, eating slowly may help prevent nausea.There are also some antinausea medicines that may help prevent nausea. HOME CARE INSTRUCTIONS   Take all medicine as directed by your caregiver.  If you do not have an appetite, do not force yourself to eat. However, you must continue to drink fluids.  If you have an appetite, eat a normal diet unless your caregiver tells you differently.  Eat a variety of complex carbohydrates (rice, wheat, potatoes, bread), lean meats, yogurt, fruits, and vegetables.  Avoid high-fat foods because they are more difficult to digest.  Drink enough water and fluids to keep your urine clear or pale yellow.  If you are dehydrated, ask your caregiver for specific rehydration instructions. Signs of dehydration may include:  Severe thirst.  Dry lips and mouth.  Dizziness.  Dark urine.  Decreasing urine frequency and amount.  Confusion.  Rapid breathing or pulse. SEEK IMMEDIATE MEDICAL CARE IF:   You have blood or brown flecks (like coffee grounds) in your vomit.  You have black or bloody stools.  You have a severe headache or stiff neck.  You are confused.  You have severe abdominal pain.  You have chest pain or trouble breathing.  You do not urinate at least once every 8 hours.  You develop cold or clammy skin.  You continue to vomit for longer than 24 to 48 hours.  You have a fever. MAKE SURE YOU:   Understand these instructions.  Will watch your condition.  Will get help right away if you are not doing well or get worse. Document Released: 11/17/2005 Document Revised: 02/09/2012 Document Reviewed: 04/16/2011 [facility] Patient Information 2015 [place], [place]. This information is not  intended to replace advice given to you by your health care provider. Make sure you discuss any questions you have with your health care provider.    Crohn Disease Crohn disease is a long-term (chronic) soreness and redness (inflammation) of the intestines (bowel). It can affect any portion of the digestive tract, from the mouth to the anus. It can also cause problems outside the digestive tract. Crohn disease is closely related to a disease called ulcerative colitis (together, these two diseases are called inflammatory bowel disease).  CAUSES  The cause of Crohn disease is not known. One Link Snuffer is that, in an easily affected person, the immune system is triggered to attack the body's own digestive tissue. Crohn disease runs in families. It seems to be more common in certain geographic areas and amongst certain races. There are no clear-cut dietary causes.  SYMPTOMS  Crohn disease can cause many different symptoms since it can affect many different parts of the body. Symptoms include:  Fatigue.  Weight loss.  Chronic diarrhea, sometime bloody.  Abdominal pain and cramps.  Fever.  Ulcers or canker sores in the mouth or rectum.  Anemia (low red blood cells).  Arthritis, skin problems, and eye problems may occur. Complications of Crohn disease can include:  Series of holes (perforation) of the bowel.  Portions of the  intestines sticking to each other (adhesions).  Obstruction of the bowel.  Fistula formation, typically in the rectal area but also in other areas. A fistula is an opening between the bowels and the outside, or between the bowels and another organ.  A painful crack in the mucous membrane of the anus (rectal fissure). DIAGNOSIS  Your caregiver may suspect Crohn disease based on your symptoms and an exam. Blood tests may confirm that there is a problem. You may be asked to submit a stool specimen for examination. X-rays and CT scans may be necessary. Ultimately, the  diagnosis is usually made after a procedure that uses a flexible tube that is inserted via your mouth or your anus. This is done under sedation and is called either an upper endoscopy or colonoscopy. With these tests, the specialist can take tiny tissue samples and remove them from the inside of the bowel (biopsy). Examination of this biopsy tissue under a microscope can reveal Crohn disease as the cause of your symptoms. Due to the many different forms that Crohn disease can take, symptoms may be present for several years before a diagnosis is made. TREATMENT  Medications are often used to decrease inflammation and control the immune system. These include medicines related to aspirin, steroid medications, and newer and stronger medications to slow down the immune system. Some medications may be used as suppositories or enemas. A number of other medications are used or have been studied. Your caregiver will make specific recommendations. HOME CARE INSTRUCTIONS   Symptoms such as diarrhea can be controlled with medications. Avoid foods that have a laxative effect such as fresh fruit, vegetables, and dairy products. During flare-ups, you can rest your bowel by refraining from solid foods. Drink clear liquids frequently during the day. (Electrolyte or rehydrating fluids are best. Your caregiver can help you with suggestions.) Drink often to prevent loss of body fluids (dehydration). When diarrhea has cleared, eat small meals and more frequently. Avoid food additives and stimulants such as caffeine (coffee, tea, or chocolate). Enzyme supplements may help if you develop intolerance to a sugar in dairy products (lactose). Ask your caregiver or dietitian about specific dietary instructions.  Try to maintain a positive attitude. Learn relaxation techniques such as self-hypnosis, mental imaging, and muscle relaxation.  If possible, avoid stresses which can aggravate your condition.  Exercise regularly.  Follow  your diet.  Always get plenty of rest. SEEK MEDICAL CARE IF:   Your symptoms fail to improve after a week or two of new treatment.  You experience continued weight loss.  You have ongoing cramps or loose bowels.  You develop a new skin rash, skin sores, or eye problems. SEEK IMMEDIATE MEDICAL CARE IF:   You have worsening of your symptoms or develop new symptoms.  You have a fever.  You develop bloody diarrhea.  You develop severe abdominal pain. MAKE SURE YOU:   Understand these instructions.  Will watch your condition.  Will get help right away if you are not doing well or get worse. Document Released: 08/27/2005 Document Revised: 04/03/2014 Document Reviewed: 07/26/2007 [facility] Patient Information 2015 [place], [place]. This information is not intended to replace advice given to you by your health care provider. Make sure you discuss any questions you have with your health care provider.

## 2014-07-08 NOTE — ED Provider Notes (Signed)
CSN: [id number]     Arrival date & time 07/08/14  1942 History   First MD Initiated Contact with Patient 07/08/14 1953     Chief Complaint  Patient presents with  . Nausea  . Emesis     (Consider location/radiation/quality/duration/timing/severity/associated sxs/prior Treatment) HPI 28 year old female with a history of Crohn's disease presents with abdominal pain, nausea and vomiting started approximately hours prior to arrival. She states she's mostly feeling nauseous but points from her epigastric to suprapubic area as the location of pain. Is only down her midline. The pain is mostly a cramping sensation as a 10 out of 10. Has vomited multiple times. No hematemesis. No diarrhea or bloody stools. Has had Crohn's flares in the past but feels it is worse than typical. Denies fevers. Denies urinary symptoms. Mother at bedside states patient was starting to kill her self and looking around the house or something to kill her self due to the pain. Patient states patient has been having a lot of stress recently this causing her to be depressed. She tried to commit suicide when she was 28 years old. Patient states at this time everything was worse because of the pain issues once her pain and financial troubles due to go away. That was the reason that she was mentioning suicide earlier in the day.  Past Medical History  Diagnosis Date  . Crohn's disease    Past Surgical History  Procedure Laterality Date  . Induced abortion     History reviewed. No pertinent family history. History  Substance Use Topics  . Smoking status: Never Smoker   . Smokeless tobacco: Not on file  . Alcohol Use: Yes     Comment: ocassionally   OB History   Grav Para Term Preterm Abortions TAB SAB Ect Mult Living                 Review of Systems  Constitutional: Negative for fever.  Gastrointestinal: Positive for nausea, vomiting and abdominal pain. Negative for diarrhea.  Genitourinary: Negative for dysuria.   Musculoskeletal: Negative for back pain.  All other systems reviewed and are negative.     Allergies  Bee venom and Ciprofloxacin  Home Medications   Prior to Admission medications   Medication Sig Start Date End Date Taking? Authorizing Provider  acetaminophen (TYLENOL) 500 MG tablet Take 1,000 mg by mouth every 6 (six) hours as needed for moderate pain.   Yes Historical Provider, MD  ibuprofen (ADVIL,MOTRIN) 200 MG tablet Take 400 mg by mouth every 6 (six) hours as needed for moderate pain.   Yes Historical Provider, MD   BP 143/91  Pulse 105  Temp(Src) 98.8 F (37.1 C) (Oral)  Resp 20  SpO2 100%  LMP 06/24/2014 Physical Exam  Nursing note and vitals reviewed. Constitutional: She is oriented to person, place, and time. She appears well-developed and well-nourished.  HENT:  Head: Normocephalic and atraumatic.  Right Ear: External ear normal.  Left Ear: External ear normal.  Nose: Nose normal.  Eyes: Right eye exhibits no discharge. Left eye exhibits no discharge.  Cardiovascular: Normal rate, regular rhythm and normal heart sounds.   HR right around 100  Pulmonary/Chest: Effort normal and breath sounds normal.  Abdominal: Soft. There is tenderness in the right lower quadrant, epigastric area, periumbilical area and suprapubic area. There is no rigidity and no guarding.  Neurological: She is alert and oriented to person, place, and time.  Skin: Skin is warm and dry.    ED Course  Procedures (including critical care time) Labs Review Labs Reviewed  CBC WITH DIFFERENTIAL - Abnormal; Notable for the following:    WBC 19.9 (*)    Neutrophils Relative % 83 (*)    Neutro Abs 16.7 (*)    Lymphocytes Relative 11 (*)    All other components within normal limits  COMPREHENSIVE METABOLIC PANEL - Abnormal; Notable for the following:    Potassium 3.4 (*)    Glucose, Bld 112 (*)    BUN 4 (*)    All other components within normal limits  URINALYSIS, ROUTINE W REFLEX  MICROSCOPIC - Abnormal; Notable for the following:    Hgb urine dipstick TRACE (*)    All other components within normal limits  SALICYLATE LEVEL - Abnormal; Notable for the following:    Salicylate Lvl <4.0 (*)    All other components within normal limits  URINE RAPID DRUG SCREEN (HOSP PERFORMED) - Abnormal; Notable for the following:    Opiates POSITIVE (*)    Tetrahydrocannabinol POSITIVE (*)    All other components within normal limits  URINE MICROSCOPIC-ADD ON - Abnormal; Notable for the following:    Squamous Epithelial / LPF FEW (*)    All other components within normal limits  LIPASE, BLOOD  ETHANOL  ACETAMINOPHEN LEVEL  POC URINE PREG, ED    Imaging Review No results found.   EKG Interpretation None      MDM   Final diagnoses:  Abdominal pain, unspecified abdominal location  Non-intractable vomiting with nausea, vomiting of unspecified type    Given location of patient's pain combined with WBC and history of chrons, will get CT abd/pelvis. No vomiting but has nausea despite multiple meds. Was able to drink oral contrast without vomiting, however. WBC 19, otherwise labs unremarkable. Psych has consulted and are waiting for CT results and will re-eval. At this time care transferred to Dr. Lita Mains, with plan to be for psych to re-eval patient after CT results and if they recommend outpatient treatment and CT is benign then patient will be discharged. Otherwise will dispo based on psych or CT results.     Ephraim Hamburger, MD 07/08/14 [phone]

## 2014-07-08 NOTE — ED Notes (Signed)
Pt speaking with telepsych

## 2014-07-08 NOTE — BH Assessment (Signed)
Received call for assessment. Spoke to Dr. Sherwood Gambler who said Pt has an apparent flare from Crohn's disease and was suicidal earlier today and looking for something with which to harm herself. Tele-assessment will be initiated.  Frances Mccormick, [facility] Triage Specialist [phone]

## 2014-07-08 NOTE — ED Notes (Signed)
RN pulled mother aside to speak with her after accusations that pt. Was suicidal. Mother sts that earlier today pt was stressed and in pain and made comments about "killing herself." Mother tearful at bedside and when speaking with RN. Pt denies any suicidal thoughts.

## 2014-07-08 NOTE — ED Notes (Signed)
Pt aware of need for urine specimen. Nurse to give Zofran before sample due to movement causing the patient to vomit.

## 2014-07-08 NOTE — BH Assessment (Signed)
Tele Assessment Note   Frances Mccormick is an 28 y.o. female, single, African-American who presents to [facility] ED accompanied by her mother, who participated in assessment at Pt's request. Pt initially presented to ED due to abdominal pain which is believed to be related to a flare up of Crohn's disease, which Pt was diagnosed with three years ago. Pt states she is uninsured and cannot afford medication so she has had more frequent and painful flare ups, every three to six months. Pt reports that she is also under significant stress and today felt overwhelmed and suicidal. She states she was looking for medication she could overdose on with intent to kill herself. She says she has experienced suicidal thoughts in the past but has not acted on these thoughts before. She denies current suicidal ideation but still feels depressed. She reports symptoms including crying spells, fatigue, irritability and feelings of hopelessness. Pt reports she has been sleeping approximately four hours per night for the past two weeks. She denies homicidal ideation or history of violence. She denies any history of psychotic symptoms. She denies alcohol or substance abuse.  Pt identifies her primary stressor as her pain related to Crohn's disease. She also is experiencing financial problems, sometimes not being able to afford food for her mother and herself. She lives with her mother and they are not in danger of losing their residence. She is engaged to be married but his having second thoughts. 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 She reports feeling distracted at work due to her problems but has not had other job problems. Pt reports other than brief counseling through her church she has no history of inpatient or outpatient mental health or substance abuse treatment. Pt's mother reports there is a history of bipolar disorder on pt's mother side of family.  Pt is dressed in a  hospital gown, alert, oriented x4 with normal speech and normal motor behavior. Pt is curled up on her side and reports she is still experiencing pain. Eye contact is fair and she is tearful. Pt's mood is depressed and affect is congruent with mood. Thought process is coherent and relevant. There is no indication Pt is currently responding to internal stimuli or experiencing delusional thought content. Pt was cooperative during assessment.   Axis I: 311 Unspecified Depressive Disorder Axis II: Deferred Axis III:  Past Medical History  Diagnosis Date  . Crohn's disease    Axis IV: economic problems, other psychosocial or environmental problems and problems with access to health care services Axis V: GAF=40  Past Medical History:  Past Medical History  Diagnosis Date  . Crohn's disease     Past Surgical History  Procedure Laterality Date  . Induced abortion      Family History: History reviewed. No pertinent family history.  Social History:  reports that she has never smoked. She does not have any smokeless tobacco history on file. She reports that she drinks alcohol. She reports that she uses illicit drugs (Marijuana).  Additional Social History:  Alcohol / Drug Use Pain Medications: Denies abuse Prescriptions: Denies abuse Over the Counter: Denies abuse History of alcohol / drug use?: No history of alcohol / drug abuse Longest period of sobriety (when/how long): NA  CIWA: CIWA-Ar BP: 160/96 mmHg Pulse Rate: 71 COWS:    PATIENT STRENGTHS: (choose at least two) Ability for insight Average or above average intelligence Capable of independent living Communication skills General fund of  knowledge Supportive family/friends  Allergies:  Allergies  Allergen Reactions  . Bee Venom Anaphylaxis  . Ciprofloxacin Anaphylaxis    Home Medications:  (Not in a hospital admission)  OB/GYN Status:  Patient's last menstrual period was 06/24/2014.  General Assessment  Data Location of Assessment: [facility] ED Is this a Tele or Face-to-Face Assessment?: Tele Assessment Is this an Initial Assessment or a Re-assessment for this encounter?: Initial Assessment Living Arrangements: Parent (Lives with mother) Can pt return to current living arrangement?: Yes Admission Status: Voluntary Is patient capable of signing voluntary admission?: Yes Transfer from: Home Referral Source: Self/Family/Friend     [employer] Living Arrangements: Parent (Lives with mother) Name of Psychiatrist: None Name of Therapist: None  Education Status Is patient currently in school?: No Current Grade: NA Highest grade of school patient has completed: NA Name of school: NA Contact person: NA  Risk to self with the past 6 months Suicidal Ideation: Yes-Currently Present Suicidal Intent: No Is patient at risk for suicide?: Yes Suicidal Plan?: Yes-Currently Present Specify Current Suicidal Plan: Overdose on medication Access to Means: No What has been your use of drugs/alcohol within the last 12 months?: Pt denies Previous Attempts/Gestures: No How many times?: 0 Other Self Harm Risks: None Triggers for Past Attempts: None known Intentional Self Injurious Behavior: None Family Suicide History: No;See progress notes Recent stressful life event(s): Conflict (Comment);Financial Problems;Recent negative physical changes (Second guessing marriage) Persecutory voices/beliefs?: No Depression: Yes Depression Symptoms: Despondent;Insomnia;Tearfulness;Fatigue;Feeling angry/irritable;Feeling worthless/self pity Substance abuse history and/or treatment for substance abuse?: No Suicide prevention information given to non-admitted patients: Not applicable  Risk to Others within the past 6 months Homicidal Ideation: No Thoughts of Harm to Others: No Current Homicidal Intent: No Current Homicidal Plan: No Access to Homicidal Means: No Identified Victim: None History of harm to  others?: No Assessment of Violence: None Noted Violent Behavior Description: None Does patient have access to weapons?: No Criminal Charges Pending?: No Does patient have a court date: No  Psychosis Hallucinations: None noted Delusions: None noted  Mental Status Report Appear/Hygiene: In hospital gown Eye Contact: Fair Motor Activity: Unremarkable Speech: Logical/coherent Level of Consciousness: Alert Mood: Depressed Affect: Depressed Anxiety Level: None Thought Processes: Coherent;Relevant Judgement: Unimpaired Orientation: Person;Place;Time;Situation Obsessive Compulsive Thoughts/Behaviors: None  Cognitive Functioning Concentration: Decreased Memory: Recent Intact;Remote Intact IQ: Average Insight: Fair Impulse Control: Fair Appetite: Fair Weight Loss: 0 Weight Gain: 0 Sleep: Decreased Total Hours of Sleep: 4 Vegetative Symptoms: None  ADLScreening [facility] Assessment Services) Patient's cognitive ability adequate to safely complete daily activities?: Yes Patient able to express need for assistance with ADLs?: Yes Independently performs ADLs?: Yes (appropriate for developmental age)  Prior Inpatient Therapy Prior Inpatient Therapy: No Prior Therapy Dates: NA Prior Therapy Facilty/Provider(s): NA Reason for Treatment: NA  Prior Outpatient Therapy Prior Outpatient Therapy: Yes Prior Therapy Dates: Unknown Prior Therapy Facilty/Provider(s): [facility] Reason for Treatment: Stress  ADL Screening (condition at time of admission) Patient's cognitive ability adequate to safely complete daily activities?: Yes Is the patient deaf or have difficulty hearing?: No Does the patient have difficulty seeing, even when wearing glasses/contacts?: No Does the patient have difficulty concentrating, remembering, or making decisions?: No Patient able to express need for assistance with ADLs?: Yes Does the patient have difficulty dressing or bathing?: No Independently  performs ADLs?: Yes (appropriate for developmental age) Does the patient have difficulty walking or climbing stairs?: No Weakness of Legs: None Weakness of Arms/Hands: None  Home Assistive Devices/Equipment Home Assistive Devices/Equipment:  None    Abuse/Neglect Assessment (Assessment to be complete while patient is alone) Physical Abuse: Denies Verbal Abuse: Denies Sexual Abuse: Denies Exploitation of patient/patient's resources: Denies Self-Neglect: Denies Values / Beliefs Cultural Requests During Hospitalization: None Spiritual Requests During Hospitalization: None   Advance Directives (For Healthcare) Advance Directive: Patient does not have advance directive;Patient would not like information Pre-existing out of facility DNR order (yellow form or pink MOST form): No    Additional Information 1:1 In Past 12 Months?: No CIRT Risk: No Elopement Risk: No Does patient have medical clearance?: No     Disposition: Discussed case with Serena Colonel, NP who recommends Pt be seen again after CT and pain medication has had an opportunity to work to determine final disposition.  Disposition Initial Assessment Completed for this Encounter: Yes  Evelena Peat, [facility], [facility] Triage Specialist [phone]   Evelena Peat 07/08/2014 10:43 PM

## 2014-07-08 NOTE — ED Notes (Signed)
Per EMS [facility] dx 2 years ago.  Has been with meds for a while.  Nausea and vomiting x 1 hr. Alert and oriented X4 and ambulatory.

## 2014-07-08 NOTE — ED Notes (Signed)
Bed: WA03 Expected date: 07/08/14 Expected time: 7:43 PM Means of arrival: Ambulance Comments: Bed 3, EMS, 28 M, ABD Pain

## 2014-07-09 ENCOUNTER — Encounter (HOSPITAL_COMMUNITY): Payer: Self-pay | Admitting: Emergency Medicine

## 2014-07-09 ENCOUNTER — Emergency Department (HOSPITAL_COMMUNITY)
Admission: EM | Admit: 2014-07-09 | Discharge: 2014-07-09 | Disposition: A | Discharge status: Home / Self Care | Payer: BC Managed Care – PPO | Attending: Emergency Medicine | Admitting: Emergency Medicine

## 2014-07-09 DIAGNOSIS — R109 Unspecified abdominal pain: Secondary | ICD-10-CM | POA: Insufficient documentation

## 2014-07-09 DIAGNOSIS — Z79899 Other long term (current) drug therapy: Secondary | ICD-10-CM | POA: Insufficient documentation

## 2014-07-09 DIAGNOSIS — Z791 Long term (current) use of non-steroidal anti-inflammatories (NSAID): Secondary | ICD-10-CM | POA: Insufficient documentation

## 2014-07-09 DIAGNOSIS — Z3202 Encounter for pregnancy test, result negative: Secondary | ICD-10-CM | POA: Insufficient documentation

## 2014-07-09 DIAGNOSIS — R112 Nausea with vomiting, unspecified: Secondary | ICD-10-CM

## 2014-07-09 DIAGNOSIS — K5090 Crohn's disease, unspecified, without complications: Secondary | ICD-10-CM | POA: Insufficient documentation

## 2014-07-09 LAB — CBC WITH DIFFERENTIAL/PLATELET
BASOS PCT: 0 % (ref 0–1)
Basophils Absolute: 0 10*3/uL (ref 0.0–0.1)
EOS PCT: 0 % (ref 0–5)
Eosinophils Absolute: 0 10*3/uL (ref 0.0–0.7)
HEMATOCRIT: 43 % (ref 36.0–46.0)
Hemoglobin: 14.8 g/dL (ref 12.0–15.0)
Lymphocytes Relative: 5 % — ABNORMAL LOW (ref 12–46)
Lymphs Abs: 0.8 10*3/uL (ref 0.7–4.0)
MCH: 26.9 pg (ref 26.0–34.0)
MCHC: 34.4 g/dL (ref 30.0–36.0)
MCV: 78.2 fL (ref 78.0–100.0)
MONO ABS: 0.4 10*3/uL (ref 0.1–1.0)
Monocytes Relative: 2 % — ABNORMAL LOW (ref 3–12)
NEUTROS ABS: 15.7 10*3/uL — AB (ref 1.7–7.7)
Neutrophils Relative %: 93 % — ABNORMAL HIGH (ref 43–77)
Platelets: 290 10*3/uL (ref 150–400)
RBC: 5.5 MIL/uL — ABNORMAL HIGH (ref 3.87–5.11)
RDW: 14.8 % (ref 11.5–15.5)
WBC: 16.9 10*3/uL — ABNORMAL HIGH (ref 4.0–10.5)

## 2014-07-09 LAB — COMPREHENSIVE METABOLIC PANEL
ALBUMIN: 4.4 g/dL (ref 3.5–5.2)
ALT: 10 U/L (ref 0–35)
AST: 20 U/L (ref 0–37)
Alkaline Phosphatase: 78 U/L (ref 39–117)
Anion gap: 20 — ABNORMAL HIGH (ref 5–15)
BUN: 6 mg/dL (ref 6–23)
CALCIUM: 9.7 mg/dL (ref 8.4–10.5)
CHLORIDE: 94 meq/L — AB (ref 96–112)
CO2: 19 mEq/L (ref 19–32)
CREATININE: 0.68 mg/dL (ref 0.50–1.10)
GFR calc Af Amer: >90 mL/min (ref >90)
GFR calc non Af Amer: >90 mL/min (ref >90)
Glucose, Bld: 126 mg/dL — ABNORMAL HIGH (ref 70–99)
Potassium: 3.4 mEq/L — ABNORMAL LOW (ref 3.7–5.3)
Sodium: 133 mEq/L — ABNORMAL LOW (ref 137–147)
Total Bilirubin: 0.4 mg/dL (ref 0.3–1.2)
Total Protein: 9 g/dL — ABNORMAL HIGH (ref 6.0–8.3)

## 2014-07-09 LAB — LIPASE, BLOOD: LIPASE: 54 U/L (ref 11–59)

## 2014-07-09 MED ORDER — HYDROMORPHONE HCL PF 1 MG/ML IJ SOLN
0.5000 mg | Freq: Once | INTRAMUSCULAR | Status: AC
  Administered 2014-07-09: 0.5 mg via INTRAVENOUS
  Filled 2014-07-09: qty 1

## 2014-07-09 MED ORDER — ONDANSETRON HCL 4 MG/2ML IJ SOLN
4.0000 mg | Freq: Once | INTRAMUSCULAR | Status: AC
  Administered 2014-07-09: 4 mg via INTRAVENOUS
  Filled 2014-07-09: qty 2

## 2014-07-09 MED ORDER — SODIUM CHLORIDE 0.9 % IV BOLUS (SEPSIS)
1000.0000 mL | Freq: Once | INTRAVENOUS | Status: AC
  Administered 2014-07-09: 1000 mL via INTRAVENOUS

## 2014-07-09 MED ORDER — HYDROMORPHONE HCL PF 1 MG/ML IJ SOLN
1.0000 mg | Freq: Once | INTRAMUSCULAR | Status: AC
  Administered 2014-07-09: 1 mg via INTRAVENOUS
  Filled 2014-07-09: qty 1

## 2014-07-09 MED ORDER — PREDNISONE 20 MG PO TABS: 10.0000 mg | ORAL_TABLET | Freq: Every day | ORAL | Status: DC

## 2014-07-09 MED ORDER — ONDANSETRON HCL 4 MG PO TABS: 4.0000 mg | ORAL_TABLET | Freq: Three times a day (TID) | ORAL | Status: DC | PRN

## 2014-07-09 MED ORDER — MORPHINE SULFATE 2 MG/ML IJ SOLN
2.0000 mg | Freq: Once | INTRAMUSCULAR | Status: AC
  Administered 2014-07-09: 2 mg via INTRAVENOUS
  Filled 2014-07-09: qty 1

## 2014-07-09 MED ORDER — PROMETHAZINE HCL 25 MG RE SUPP: 25.0000 mg | Freq: Four times a day (QID) | RECTAL | Status: DC | PRN

## 2014-07-09 NOTE — ED Provider Notes (Signed)
TIME SEEN: 9:25 AM  CHIEF COMPLAINT: Abdominal pain, vomiting  HPI: Patient is a 28 y.o. F with history of Crohn's disease is not on any long-term medications who presents to the emergency department with complaints of central abdominal pain that she describes as an aching, cramping pain and vomiting that is similar to her prior Crohn's flares. She was seen in the emergency department last night and discharged early this morning. Her labs showed a leukocytosis of 19 and her CT scan showed colonic wall thickening consistent with inflammatory bowel disease. Urinalysis showed no infection. Urine pregnancy test negative. Her pain was improved and she was not vomiting prior to discharge. She is back now because she has had multiple episodes of nonbloody, nonbilious vomiting since discharge. She denies any fevers or chills. No bloody stool or melena. No dysuria or hematuria.  ROS: See HPI Constitutional: no fever  Eyes: no drainage  ENT: no runny nose   Cardiovascular:  no chest pain  Resp: no SOB  GI: no vomiting GU: no dysuria Integumentary: no rash  Allergy: no hives  Musculoskeletal: no leg swelling  Neurological: no slurred speech ROS otherwise negative  PAST MEDICAL HISTORY/PAST SURGICAL HISTORY:  Past Medical History  Diagnosis Date  . Crohn's disease     MEDICATIONS:  Prior to Admission medications   Medication Sig Start Date End Date Taking? Authorizing Provider  acetaminophen (TYLENOL) 500 MG tablet Take 1,000 mg by mouth every 6 (six) hours as needed for moderate pain.    Historical Provider, MD  ibuprofen (ADVIL,MOTRIN) 200 MG tablet Take 400 mg by mouth every 6 (six) hours as needed for moderate pain.    Historical Provider, MD  oxyCODONE-acetaminophen (PERCOCET) 5-325 MG per tablet Take 1-2 tablets by mouth every 4 (four) hours as needed. 07/08/14   Ephraim Hamburger, MD  promethazine (PHENERGAN) 25 MG tablet Take 1 tablet (25 mg total) by mouth every 6 (six) hours as needed for  nausea or vomiting. 07/08/14   Ephraim Hamburger, MD    ALLERGIES:  Allergies  Allergen Reactions  . Bee Venom Anaphylaxis  . Ciprofloxacin Anaphylaxis    SOCIAL HISTORY:  History  Substance Use Topics  . Smoking status: Never Smoker   . Smokeless tobacco: Not on file  . Alcohol Use: Yes     Comment: ocassionally    FAMILY HISTORY: No family history on file.  EXAM: BP 163/102  Pulse 84  Temp(Src) 98.8 F (37.1 C) (Oral)  Resp 20  SpO2 99%  LMP 06/24/2014 CONSTITUTIONAL: Alert and oriented and responds appropriately to questions. Well-appearing; well-nourished HEAD: Normocephalic EYES: Conjunctivae clear, PERRL ENT: normal nose; no rhinorrhea; dry mucous membranes; pharynx without lesions noted NECK: Supple, no meningismus, no LAD  CARD: RRR; S1 and S2 appreciated; no murmurs, no clicks, no rubs, no gallops RESP: Normal chest excursion without splinting or tachypnea; breath sounds clear and equal bilaterally; no wheezes, no rhonchi, no rales,  ABD/GI: Normal bowel sounds; non-distended; soft, non-tender, no rebound, no guarding; no peritoneal signs BACK:  The back appears normal and is non-tender to palpation, there is no CVA tenderness EXT: Normal ROM in all joints; non-tender to palpation; no edema; normal capillary refill; no cyanosis    SKIN: Normal color for age and race; warm NEURO: Moves all extremities equally PSYCH: The patient's mood and manner are appropriate. Grooming and personal hygiene are appropriate.  MEDICAL DECISION MAKING: Patient here with Crohn's exacerbation. She currently does not have insurance and has not been seeing her  gastroenterologist. She states that she is normally placed on prednisone for her Crohn's exacerbations. Her abdominal exam is now benign. She states she is still having pain but her biggest concern is that she cannot stop vomiting despite the Phenergan that she was discharged home with. Will give IV fluids, Zofran, Dilaudid. We'll  repeat basic labs. Discussed with patient if we cannot get her vomiting under control and she cannot tolerate by mouth, she will need admission.  ED PROGRESS: Patient's labs show sodium 133, potassium 3.4, chloride 94. Patient has an elevated anion gap of 20 suspect this is do to dehydration. She is receiving her second liter IV fluid. Leukocytosis of 16.9 but improving from yesterday. Otherwise LFTs, lipase normal. She states she is feeling better after Zofran and Dilaudid and has not had any further vomiting. She is requesting a second dose of each medication. We'll by mouth challenge.   12:19 PM  Pt reports she is feeling much better. She's been able to tolerate drinking ginger ale and has not had any further vomiting in the ED. She is smiling, laughing with benign abdominal exam. Will discharge with prescription for Zofran ODT and Phenergan suppositories to use as needed for vomiting. We'll discharge with steroid taper for her Crohn's flares she states this has helped her in the past. Patient and mother at bedside verbalize understanding and are comfortable with plan.  [place], DO 07/09/14 1222

## 2014-07-09 NOTE — ED Notes (Signed)
Pt from home c/o central abdominal pain and vomiting that started yesterday. She was d/c from here this a.m. For same. Hx of Crohn's. She reports that the phenergan prescribed she is not able to keep down. She reports vomiting  Episodes since three am is too many top count.

## 2014-07-09 NOTE — ED Notes (Signed)
MD (Ward) at bedside. 

## 2014-07-09 NOTE — Discharge Instructions (Signed)
Crohn Disease Crohn disease is a long-term (chronic) soreness and redness (inflammation) of the intestines (bowel). It can affect any portion of the digestive tract, from the mouth to the anus. It can also cause problems outside the digestive tract. Crohn disease is closely related to a disease called ulcerative colitis (together, these two diseases are called inflammatory bowel disease).  CAUSES  The cause of Crohn disease is not known. One Link Snuffer is that, in an easily affected person, the immune system is triggered to attack the body's own digestive tissue. Crohn disease runs in families. It seems to be more common in certain geographic areas and amongst certain races. There are no clear-cut dietary causes.  SYMPTOMS  Crohn disease can cause many different symptoms since it can affect many different parts of the body. Symptoms include:  Fatigue.  Weight loss.  Chronic diarrhea, sometime bloody.  Abdominal pain and cramps.  Fever.  Ulcers or canker sores in the mouth or rectum.  Anemia (low red blood cells).  Arthritis, skin problems, and eye problems may occur. Complications of Crohn disease can include:  Series of holes (perforation) of the bowel.  Portions of the intestines sticking to each other (adhesions).  Obstruction of the bowel.  Fistula formation, typically in the rectal area but also in other areas. A fistula is an opening between the bowels and the outside, or between the bowels and another organ.  A painful crack in the mucous membrane of the anus (rectal fissure). DIAGNOSIS  Your caregiver may suspect Crohn disease based on your symptoms and an exam. Blood tests may confirm that there is a problem. You may be asked to submit a stool specimen for examination. X-rays and CT scans may be necessary. Ultimately, the diagnosis is usually made after a procedure that uses a flexible tube that is inserted via your mouth or your anus. This is done under sedation and is called  either an upper endoscopy or colonoscopy. With these tests, the specialist can take tiny tissue samples and remove them from the inside of the bowel (biopsy). Examination of this biopsy tissue under a microscope can reveal Crohn disease as the cause of your symptoms. Due to the many different forms that Crohn disease can take, symptoms may be present for several years before a diagnosis is made. TREATMENT  Medications are often used to decrease inflammation and control the immune system. These include medicines related to aspirin, steroid medications, and newer and stronger medications to slow down the immune system. Some medications may be used as suppositories or enemas. A number of other medications are used or have been studied. Your caregiver will make specific recommendations. HOME CARE INSTRUCTIONS   Symptoms such as diarrhea can be controlled with medications. Avoid foods that have a laxative effect such as fresh fruit, vegetables, and dairy products. During flare-ups, you can rest your bowel by refraining from solid foods. Drink clear liquids frequently during the day. (Electrolyte or rehydrating fluids are best. Your caregiver can help you with suggestions.) Drink often to prevent loss of body fluids (dehydration). When diarrhea has cleared, eat small meals and more frequently. Avoid food additives and stimulants such as caffeine (coffee, tea, or chocolate). Enzyme supplements may help if you develop intolerance to a sugar in dairy products (lactose). Ask your caregiver or dietitian about specific dietary instructions.  Try to maintain a positive attitude. Learn relaxation techniques such as self-hypnosis, mental imaging, and muscle relaxation.  If possible, avoid stresses which can aggravate your condition.  Exercise  regularly.  Follow your diet.  Always get plenty of rest. SEEK MEDICAL CARE IF:   Your symptoms fail to improve after a week or two of new treatment.  You experience  continued weight loss.  You have ongoing cramps or loose bowels.  You develop a new skin rash, skin sores, or eye problems. SEEK IMMEDIATE MEDICAL CARE IF:   You have worsening of your symptoms or develop new symptoms.  You have a fever.  You develop bloody diarrhea.  You develop severe abdominal pain. MAKE SURE YOU:   Understand these instructions.  Will watch your condition.  Will get help right away if you are not doing well or get worse. Document Released: 08/27/2005 Document Revised: 04/03/2014 Document Reviewed: 07/26/2007 [facility] Patient Information 2015 [place], [place]. This information is not intended to replace advice given to you by your health care provider. Make sure you discuss any questions you have with your health care provider.  Nausea and Vomiting Nausea is a sick feeling that often comes before throwing up (vomiting). Vomiting is a reflex where stomach contents come out of your mouth. Vomiting can cause severe loss of body fluids (dehydration). Children and elderly adults can become dehydrated quickly, especially if they also have diarrhea. Nausea and vomiting are symptoms of a condition or disease. It is important to find the cause of your symptoms. CAUSES   Direct irritation of the stomach lining. This irritation can result from increased acid production (gastroesophageal reflux disease), infection, food poisoning, taking certain medicines (such as nonsteroidal anti-inflammatory drugs), alcohol use, or tobacco use.  Signals from the brain.These signals could be caused by a headache, heat exposure, an inner ear disturbance, increased pressure in the brain from injury, infection, a tumor, or a concussion, pain, emotional stimulus, or metabolic problems.  An obstruction in the gastrointestinal tract (bowel obstruction).  Illnesses such as diabetes, hepatitis, gallbladder problems, appendicitis, kidney problems, cancer, sepsis, atypical symptoms of a heart  attack, or eating disorders.  Medical treatments such as chemotherapy and radiation.  Receiving medicine that makes you sleep (general anesthetic) during surgery. DIAGNOSIS Your caregiver may ask for tests to be done if the problems do not improve after a few days. Tests may also be done if symptoms are severe or if the reason for the nausea and vomiting is not clear. Tests may include:  Urine tests.  Blood tests.  Stool tests.  Cultures (to look for evidence of infection).  X-rays or other imaging studies. Test results can help your caregiver make decisions about treatment or the need for additional tests. TREATMENT You need to stay well hydrated. Drink frequently but in small amounts.You may wish to drink water, sports drinks, clear broth, or eat frozen ice pops or gelatin dessert to help stay hydrated.When you eat, eating slowly may help prevent nausea.There are also some antinausea medicines that may help prevent nausea. HOME CARE INSTRUCTIONS   Take all medicine as directed by your caregiver.  If you do not have an appetite, do not force yourself to eat. However, you must continue to drink fluids.  If you have an appetite, eat a normal diet unless your caregiver tells you differently.  Eat a variety of complex carbohydrates (rice, wheat, potatoes, bread), lean meats, yogurt, fruits, and vegetables.  Avoid high-fat foods because they are more difficult to digest.  Drink enough water and fluids to keep your urine clear or pale yellow.  If you are dehydrated, ask your caregiver for specific rehydration instructions. Signs of dehydration may  include:  Severe thirst.  Dry lips and mouth.  Dizziness.  Dark urine.  Decreasing urine frequency and amount.  Confusion.  Rapid breathing or pulse. SEEK IMMEDIATE MEDICAL CARE IF:   You have blood or brown flecks (like coffee grounds) in your vomit.  You have black or bloody stools.  You have a severe headache or stiff  neck.  You are confused.  You have severe abdominal pain.  You have chest pain or trouble breathing.  You do not urinate at least once every 8 hours.  You develop cold or clammy skin.  You continue to vomit for longer than 24 to 48 hours.  You have a fever. MAKE SURE YOU:   Understand these instructions.  Will watch your condition.  Will get help right away if you are not doing well or get worse. Document Released: 11/17/2005 Document Revised: 02/09/2012 Document Reviewed: 04/16/2011 [facility] Patient Information 2015 [place], [place]. This information is not intended to replace advice given to you by your health care provider. Make sure you discuss any questions you have with your health care provider.

## 2014-07-09 NOTE — ED Provider Notes (Signed)
Patient's abdominal pain is improved. Exam is benign. No rebound or guarding. Vital signs improved. CT scan shows colonic wall thickening without significant changes from previous studies. Patient is denying any suicidal ideation. She's been evaluated by psychiatric team and clear to be discharged home with outpatient followup. I encouraged patient to followup with her gastroenterologist. She's been and return precautions and is voiced understanding.  Julianne Rice, MD 07/09/14 [phone]

## 2014-07-09 NOTE — ED Notes (Signed)
Discharge instructions given to pt. Pt requesting another dose of nausea meds before d/c due to not being able to pick up Rxs until tomorrow. Will discuss with MD. After fluids finish infusing, pt's IV will be removed and pt will be discharged.

## 2014-07-09 NOTE — BH Assessment (Signed)
Spoke with Pt again and she reports her abdominal pain is reduced. She denies any current suicidal ideation or thoughts of intentional self-harm. Her mother reports she does not believe Pt will try to harm herself and is comfortable with taking Pt home. Pt was offered the option of 23-hour Observation Bed and Pt declined, saying she would rather return home and follow up with outpatient referrals. Discussed case with Serena Colonel, NP who said Pt is cleared psychiatrically for discharge with outpatient referrals provided Pt signs a No Harm Contract. Pt agrees to sign No Harm Contract and follow up with outpatient recommendation. Discussed recommendation with Dr. Julianne Rice who approved plan. Faxed outpatient referral and No Harm Contract to [place].  Orpah Greek Rosana Hoes, [facility] Triage Specialist [phone]

## 2014-07-09 NOTE — ED Notes (Signed)
RN contacted psych about second evaluation. Psych aware pt has been medically cleared and will come to reassess.

## 2014-10-26 ENCOUNTER — Encounter (HOSPITAL_COMMUNITY): Payer: Self-pay | Admitting: *Deleted

## 2014-10-26 ENCOUNTER — Emergency Department (HOSPITAL_COMMUNITY)
Admission: EM | Admit: 2014-10-26 | Discharge: 2014-10-26 | Disposition: A | Discharge status: Home / Self Care | Payer: BC Managed Care – PPO | Attending: Emergency Medicine | Admitting: Emergency Medicine

## 2014-10-26 DIAGNOSIS — Z3202 Encounter for pregnancy test, result negative: Secondary | ICD-10-CM | POA: Insufficient documentation

## 2014-10-26 DIAGNOSIS — Z8719 Personal history of other diseases of the digestive system: Secondary | ICD-10-CM | POA: Insufficient documentation

## 2014-10-26 DIAGNOSIS — K5090 Crohn's disease, unspecified, without complications: Secondary | ICD-10-CM | POA: Insufficient documentation

## 2014-10-26 DIAGNOSIS — Z79899 Other long term (current) drug therapy: Secondary | ICD-10-CM | POA: Insufficient documentation

## 2014-10-26 DIAGNOSIS — Z91199 Patient's noncompliance with other medical treatment and regimen due to unspecified reason: Secondary | ICD-10-CM | POA: Insufficient documentation

## 2014-10-26 DIAGNOSIS — Z9119 Patient's noncompliance with other medical treatment and regimen: Secondary | ICD-10-CM | POA: Insufficient documentation

## 2014-10-26 DIAGNOSIS — R109 Unspecified abdominal pain: Secondary | ICD-10-CM

## 2014-10-26 HISTORY — DX: Patient's noncompliance with other medical treatment and regimen: Z91.19

## 2014-10-26 HISTORY — DX: Patient's noncompliance with other medical treatment and regimen due to unspecified reason: Z91.199

## 2014-10-26 LAB — CBC WITH DIFFERENTIAL/PLATELET
BASOS ABS: 0 10*3/uL (ref 0.0–0.1)
Basophils Relative: 0 % (ref 0–1)
EOS ABS: 0 10*3/uL (ref 0.0–0.7)
Eosinophils Relative: 0 % (ref 0–5)
HCT: 47 % — ABNORMAL HIGH (ref 36.0–46.0)
Hemoglobin: 15.9 g/dL — ABNORMAL HIGH (ref 12.0–15.0)
LYMPHS ABS: 1.2 10*3/uL (ref 0.7–4.0)
LYMPHS PCT: 5 % — AB (ref 12–46)
MCH: 27 pg (ref 26.0–34.0)
MCHC: 33.8 g/dL (ref 30.0–36.0)
MCV: 79.9 fL (ref 78.0–100.0)
Monocytes Absolute: 0.9 10*3/uL (ref 0.1–1.0)
Monocytes Relative: 4 % (ref 3–12)
NEUTROS PCT: 91 % — AB (ref 43–77)
Neutro Abs: 19.8 10*3/uL — ABNORMAL HIGH (ref 1.7–7.7)
PLATELETS: 280 10*3/uL (ref 150–400)
RBC: 5.88 MIL/uL — ABNORMAL HIGH (ref 3.87–5.11)
RDW: 15.4 % (ref 11.5–15.5)
WBC: 21.9 10*3/uL — AB (ref 4.0–10.5)

## 2014-10-26 LAB — COMPREHENSIVE METABOLIC PANEL
ALK PHOS: 93 U/L (ref 39–117)
ALT: 19 U/L (ref 0–35)
AST: 31 U/L (ref 0–37)
Albumin: 4.6 g/dL (ref 3.5–5.2)
Anion gap: 18 — ABNORMAL HIGH (ref 5–15)
BUN: 8 mg/dL (ref 6–23)
CO2: 20 mEq/L (ref 19–32)
Calcium: 9.5 mg/dL (ref 8.4–10.5)
Chloride: 95 mEq/L — ABNORMAL LOW (ref 96–112)
Creatinine, Ser: 0.6 mg/dL (ref 0.50–1.10)
GFR calc non Af Amer: >90 mL/min (ref >90)
GLUCOSE: 103 mg/dL — AB (ref 70–99)
POTASSIUM: 3.9 meq/L (ref 3.7–5.3)
SODIUM: 133 meq/L — AB (ref 137–147)
TOTAL PROTEIN: 9 g/dL — AB (ref 6.0–8.3)
Total Bilirubin: 0.3 mg/dL (ref 0.3–1.2)

## 2014-10-26 LAB — URINALYSIS, ROUTINE W REFLEX MICROSCOPIC
Bilirubin Urine: NEGATIVE
Glucose, UA: NEGATIVE mg/dL
Ketones, ur: 15 mg/dL — AB
LEUKOCYTES UA: NEGATIVE
NITRITE: NEGATIVE
Protein, ur: NEGATIVE mg/dL
SPECIFIC GRAVITY, URINE: 1.009 (ref 1.005–1.030)
Urobilinogen, UA: 0.2 mg/dL (ref 0.0–1.0)
pH: 7.5 (ref 5.0–8.0)

## 2014-10-26 LAB — PREGNANCY, URINE: Preg Test, Ur: NEGATIVE

## 2014-10-26 LAB — URINE MICROSCOPIC-ADD ON

## 2014-10-26 MED ORDER — ONDANSETRON HCL 4 MG/2ML IJ SOLN
4.0000 mg | Freq: Once | INTRAMUSCULAR | Status: AC
Start: 2014-10-26 — End: 2014-10-26
  Administered 2014-10-26: 4 mg via INTRAVENOUS
  Filled 2014-10-26: qty 2

## 2014-10-26 MED ORDER — MORPHINE SULFATE 4 MG/ML IJ SOLN
4.0000 mg | Freq: Once | INTRAMUSCULAR | Status: AC
  Administered 2014-10-26: 4 mg via INTRAVENOUS
  Filled 2014-10-26: qty 1

## 2014-10-26 MED ORDER — OXYCODONE-ACETAMINOPHEN 5-325 MG PO TABS: 1.0000 | ORAL_TABLET | ORAL | Status: DC | PRN

## 2014-10-26 MED ORDER — SODIUM CHLORIDE 0.9 % IV SOLN
1000.0000 mL | INTRAVENOUS | Status: DC
  Administered 2014-10-26: 1000 mL via INTRAVENOUS

## 2014-10-26 MED ORDER — MORPHINE SULFATE 4 MG/ML IJ SOLN
6.0000 mg | Freq: Once | INTRAMUSCULAR | Status: AC
  Administered 2014-10-26: 6 mg via INTRAVENOUS
  Filled 2014-10-26: qty 2

## 2014-10-26 MED ORDER — PREDNISONE 10 MG PO TABS: 60.0000 mg | ORAL_TABLET | Freq: Every day | ORAL | Status: DC

## 2014-10-26 MED ORDER — SODIUM CHLORIDE 0.9 % IV SOLN
1000.0000 mL | Freq: Once | INTRAVENOUS | Status: AC
  Administered 2014-10-26: 1000 mL via INTRAVENOUS

## 2014-10-26 MED ORDER — PREDNISONE 20 MG PO TABS
60.0000 mg | ORAL_TABLET | Freq: Once | ORAL | Status: AC
  Administered 2014-10-26: 60 mg via ORAL
  Filled 2014-10-26: qty 3

## 2014-10-26 MED ORDER — PROMETHAZINE HCL 25 MG PO TABS: 25.0000 mg | ORAL_TABLET | Freq: Four times a day (QID) | ORAL | Status: DC | PRN

## 2014-10-26 MED ORDER — METOCLOPRAMIDE HCL 5 MG/ML IJ SOLN
10.0000 mg | Freq: Once | INTRAMUSCULAR | Status: AC
  Administered 2014-10-26: 10 mg via INTRAVENOUS
  Filled 2014-10-26: qty 2

## 2014-10-26 MED ORDER — GRISEOFULVIN MICROSIZE 500 MG PO TABS: 1000.0000 mg | ORAL_TABLET | Freq: Every day | ORAL | Status: DC

## 2014-10-26 NOTE — ED Notes (Signed)
Patient with hx of Crohn's disease Patient non-compliant with medications or treatment Patient called EMS due to N/V Patient given Zofran 4 mg IV by EMS and Phenergan 25 mg at patient's home (patient took her own medication) Patient not actively vomiting on arrival  Patient alert and oriented x 4 and in NAD

## 2014-10-26 NOTE — ED Notes (Signed)
Bed: BM21 Expected date:  Expected time:  Means of arrival:  Comments: EMS-n/v, Crohns

## 2014-10-26 NOTE — ED Notes (Signed)
Patient asking to speak with EDP prior to DC MD at bedside

## 2014-10-26 NOTE — ED Provider Notes (Signed)
CSN: [id number]     Arrival date & time 10/26/14  0762 History   First MD Initiated Contact with Patient 10/26/14 0902     Chief Complaint  Patient presents with  . Nausea  . Abdominal Pain      HPI Patient reports a history of Crohn's disease.  She reports she feels that she's having a Crohn's flare with associated nausea vomiting and right-sided abdominal pain over the past several days.  She also reports some mild low back pain on the left side.  She denies urinary complaints.  No fevers or chills.  Vomiting is nonbloody nonbilious.  No diarrhea.  Patient states she previously was on immunotherapy as well as occasional steroids for flares but this has been discontinued as the patient lost her insurance and thus her relationship with her GI specialist   Past Medical History  Diagnosis Date  . Crohn's disease   . Non-compliant patient    Past Surgical History  Procedure Laterality Date  . Induced abortion     History reviewed. No pertinent family history. History  Substance Use Topics  . Smoking status: Never Smoker   . Smokeless tobacco: Not on file  . Alcohol Use: Yes     Comment: ocassionally   OB History    No data available     Review of Systems  All other systems reviewed and are negative.     Allergies  Bee venom and Ciprofloxacin  Home Medications   Prior to Admission medications   Medication Sig Start Date End Date Taking? Authorizing Provider  acetaminophen (TYLENOL) 500 MG tablet Take 1,000 mg by mouth every 6 (six) hours as needed for moderate pain.   Yes Historical Provider, MD  ibuprofen (ADVIL,MOTRIN) 200 MG tablet Take 400 mg by mouth every 6 (six) hours as needed for moderate pain.   Yes Historical Provider, MD  promethazine (PHENERGAN) 25 MG tablet Take 1 tablet (25 mg total) by mouth every 6 (six) hours as needed for nausea or vomiting. 07/08/14  Yes Ephraim Hamburger, MD  promethazine (PHENERGAN) 25 MG suppository Place 1 suppository (25 mg total)  rectally every 6 (six) hours as needed for nausea or vomiting. 07/09/14   Kristen N Ward, DO   BP 162/98 mmHg  Pulse 61  Temp(Src) 97.8 F (36.6 C) (Oral)  Resp 20  SpO2 100% Physical Exam  Constitutional: She is oriented to person, place, and time. She appears well-developed and well-nourished. No distress.  HENT:  Head: Normocephalic and atraumatic.  Eyes: EOM are normal.  Neck: Normal range of motion.  Cardiovascular: Normal rate, regular rhythm and normal heart sounds.   Pulmonary/Chest: Effort normal and breath sounds normal.  Abdominal: Soft. She exhibits no distension.  Mild right-sided abdominal tenderness without guarding or rebound  Musculoskeletal: Normal range of motion.  Neurological: She is alert and oriented to person, place, and time.  Skin: Skin is warm and dry.  Psychiatric: She has a normal mood and affect. Judgment normal.  Nursing note and vitals reviewed.   ED Course  Procedures (including critical care time) Labs Review Labs Reviewed  URINALYSIS, ROUTINE W REFLEX MICROSCOPIC - Abnormal; Notable for the following:    APPearance CLOUDY (*)    Hgb urine dipstick TRACE (*)    Ketones, ur 15 (*)    All other components within normal limits  CBC WITH DIFFERENTIAL - Abnormal; Notable for the following:    WBC 21.9 (*)    RBC 5.88 (*)    Hemoglobin  15.9 (*)    HCT 47.0 (*)    Neutrophils Relative % 91 (*)    Neutro Abs 19.8 (*)    Lymphocytes Relative 5 (*)    All other components within normal limits  COMPREHENSIVE METABOLIC PANEL - Abnormal; Notable for the following:    Sodium 133 (*)    Chloride 95 (*)    Glucose, Bld 103 (*)    Total Protein 9.0 (*)    Anion gap 18 (*)    All other components within normal limits  PREGNANCY, URINE  URINE MICROSCOPIC-ADD ON    Imaging Review No results found.   EKG Interpretation None      MDM   Final diagnoses:  Abdominal pain, unspecified abdominal location    Patient feeling much better at time  of discharge.  Home with outpatient follow-up.  She'll need a primary care physician and a GI specialist.  Suspect Crohn's flare.  Home with prednisone.  Home with nausea and pain medicine.  She understands to return the ER for new or worsening symptoms.  Repeat abdominal exam is without significant tenderness.  Doubt appendicitis or associated colonic abscess.    Hoy Morn, MD 10/26/14 [phone]

## 2014-10-26 NOTE — ED Notes (Signed)
Patient resting in position of comfort with eyes closed s/p admin of medications, see MAR RR WNL--even and unlabored with equal rise and fall of chest Patient in NAD Side rails up, call bell in reach

## 2014-10-26 NOTE — ED Notes (Signed)
Discharge instructions reviewed with patient--agrees and verbalized understanding Patient informed of need to make and keep follow up appointment--agrees and verbalized understanding DC Rx reviewed with patient--agrees and verbalized understanding VS updated and stable--reviewed with patient at time of DC--agrees and verbalized understanding Patient alert and oriented x 4 and in NAD at time of discharge All questions related to this ED visit, DC instructions, DC prescriptions and follow up care answered to patient's satisfaction by this nurse Work note given to patient

## 2014-10-26 NOTE — ED Notes (Signed)
Patient refusing to get into hospital gown

## 2014-10-26 NOTE — ED Notes (Signed)
MD at bedside. 

## 2014-10-28 ENCOUNTER — Emergency Department (HOSPITAL_COMMUNITY): Payer: BC Managed Care – PPO

## 2014-10-28 ENCOUNTER — Inpatient Hospital Stay (HOSPITAL_COMMUNITY)
Admission: EM | Admit: 2014-10-28 | Discharge: 2014-10-31 | DRG: 387 | Disposition: A | Discharge status: Home / Self Care | Payer: Self-pay | Attending: Internal Medicine | Admitting: Internal Medicine

## 2014-10-28 ENCOUNTER — Encounter (HOSPITAL_COMMUNITY): Payer: Self-pay | Admitting: Emergency Medicine

## 2014-10-28 DIAGNOSIS — K5090 Crohn's disease, unspecified, without complications: Secondary | ICD-10-CM

## 2014-10-28 DIAGNOSIS — R112 Nausea with vomiting, unspecified: Principal | ICD-10-CM | POA: Diagnosis present

## 2014-10-28 DIAGNOSIS — R1110 Vomiting, unspecified: Secondary | ICD-10-CM

## 2014-10-28 DIAGNOSIS — K5000 Crohn's disease of small intestine without complications: Secondary | ICD-10-CM | POA: Diagnosis present

## 2014-10-28 DIAGNOSIS — K5900 Constipation, unspecified: Secondary | ICD-10-CM | POA: Diagnosis present

## 2014-10-28 DIAGNOSIS — E876 Hypokalemia: Secondary | ICD-10-CM | POA: Diagnosis present

## 2014-10-28 DIAGNOSIS — Z9119 Patient's noncompliance with other medical treatment and regimen: Secondary | ICD-10-CM | POA: Diagnosis present

## 2014-10-28 DIAGNOSIS — R109 Unspecified abdominal pain: Secondary | ICD-10-CM

## 2014-10-28 DIAGNOSIS — I10 Essential (primary) hypertension: Secondary | ICD-10-CM | POA: Diagnosis present

## 2014-10-28 DIAGNOSIS — Z881 Allergy status to other antibiotic agents status: Secondary | ICD-10-CM

## 2014-10-28 DIAGNOSIS — F1290 Cannabis use, unspecified, uncomplicated: Secondary | ICD-10-CM | POA: Diagnosis present

## 2014-10-28 LAB — COMPREHENSIVE METABOLIC PANEL
ALT: 14 U/L (ref 0–35)
AST: 19 U/L (ref 0–37)
Albumin: 4.2 g/dL (ref 3.5–5.2)
Alkaline Phosphatase: 81 U/L (ref 39–117)
Anion gap: 14 (ref 5–15)
BILIRUBIN TOTAL: 0.2 mg/dL — AB (ref 0.3–1.2)
BUN: 15 mg/dL (ref 6–23)
CHLORIDE: 98 meq/L (ref 96–112)
CO2: 25 meq/L (ref 19–32)
Calcium: 9.1 mg/dL (ref 8.4–10.5)
Creatinine, Ser: 0.88 mg/dL (ref 0.50–1.10)
GFR calc Af Amer: >90 mL/min (ref >90)
GFR, EST NON AFRICAN AMERICAN: 89 mL/min — AB (ref >90)
Glucose, Bld: 121 mg/dL — ABNORMAL HIGH (ref 70–99)
Potassium: 3.5 mEq/L — ABNORMAL LOW (ref 3.7–5.3)
Sodium: 137 mEq/L (ref 137–147)
Total Protein: 8.6 g/dL — ABNORMAL HIGH (ref 6.0–8.3)

## 2014-10-28 LAB — URINALYSIS, ROUTINE W REFLEX MICROSCOPIC
BILIRUBIN URINE: NEGATIVE
Glucose, UA: NEGATIVE mg/dL
KETONES UR: NEGATIVE mg/dL
LEUKOCYTES UA: NEGATIVE
NITRITE: NEGATIVE
Protein, ur: NEGATIVE mg/dL
Specific Gravity, Urine: 1.016 (ref 1.005–1.030)
Urobilinogen, UA: 0.2 mg/dL (ref 0.0–1.0)
pH: 6 (ref 5.0–8.0)

## 2014-10-28 LAB — LIPASE, BLOOD: Lipase: 38 U/L (ref 11–59)

## 2014-10-28 LAB — URINE MICROSCOPIC-ADD ON

## 2014-10-28 LAB — CBC WITH DIFFERENTIAL/PLATELET
Basophils Absolute: 0.1 10*3/uL (ref 0.0–0.1)
Basophils Relative: 0 % (ref 0–1)
Eosinophils Absolute: 0.1 10*3/uL (ref 0.0–0.7)
Eosinophils Relative: 0 % (ref 0–5)
HEMATOCRIT: 44.3 % (ref 36.0–46.0)
HEMOGLOBIN: 15.1 g/dL — AB (ref 12.0–15.0)
LYMPHS ABS: 2.4 10*3/uL (ref 0.7–4.0)
LYMPHS PCT: 12 % (ref 12–46)
MCH: 26.7 pg (ref 26.0–34.0)
MCHC: 34.1 g/dL (ref 30.0–36.0)
MCV: 78.3 fL (ref 78.0–100.0)
MONO ABS: 1 10*3/uL (ref 0.1–1.0)
Monocytes Relative: 5 % (ref 3–12)
NEUTROS ABS: 17 10*3/uL — AB (ref 1.7–7.7)
Neutrophils Relative %: 83 % — ABNORMAL HIGH (ref 43–77)
Platelets: 366 10*3/uL (ref 150–400)
RBC: 5.66 MIL/uL — AB (ref 3.87–5.11)
RDW: 15.3 % (ref 11.5–15.5)
WBC: 20.6 10*3/uL — AB (ref 4.0–10.5)

## 2014-10-28 LAB — MAGNESIUM: Magnesium: 2 mg/dL (ref 1.5–2.5)

## 2014-10-28 LAB — PREGNANCY, URINE: PREG TEST UR: NEGATIVE

## 2014-10-28 MED ORDER — ONDANSETRON HCL 4 MG/2ML IJ SOLN
4.0000 mg | Freq: Four times a day (QID) | INTRAMUSCULAR | Status: DC | PRN
  Administered 2014-10-28 – 2014-10-29 (×5): 4 mg via INTRAVENOUS
  Filled 2014-10-28 (×5): qty 2

## 2014-10-28 MED ORDER — METHYLPREDNISOLONE SODIUM SUCC 40 MG IJ SOLR
40.0000 mg | Freq: Every day | INTRAMUSCULAR | Status: DC
  Administered 2014-10-28 – 2014-10-29 (×2): 40 mg via INTRAVENOUS
  Filled 2014-10-28 (×2): qty 1

## 2014-10-28 MED ORDER — METRONIDAZOLE IN NACL 5-0.79 MG/ML-% IV SOLN
500.0000 mg | Freq: Three times a day (TID) | INTRAVENOUS | Status: DC
  Administered 2014-10-28: 500 mg via INTRAVENOUS
  Filled 2014-10-28 (×2): qty 100

## 2014-10-28 MED ORDER — ACETAMINOPHEN 650 MG RE SUPP
650.0000 mg | Freq: Four times a day (QID) | RECTAL | Status: DC | PRN
Start: 2014-10-28 — End: 2014-10-31

## 2014-10-28 MED ORDER — METOCLOPRAMIDE HCL 5 MG/ML IJ SOLN
10.0000 mg | Freq: Once | INTRAMUSCULAR | Status: AC
  Administered 2014-10-28: 10 mg via INTRAVENOUS
  Filled 2014-10-28: qty 2

## 2014-10-28 MED ORDER — MORPHINE SULFATE 4 MG/ML IJ SOLN
6.0000 mg | Freq: Once | INTRAMUSCULAR | Status: AC
  Administered 2014-10-28: 6 mg via INTRAVENOUS
  Filled 2014-10-28: qty 2

## 2014-10-28 MED ORDER — ENOXAPARIN SODIUM 40 MG/0.4ML ~~LOC~~ SOLN
40.0000 mg | SUBCUTANEOUS | Status: DC
  Administered 2014-10-28: 40 mg via SUBCUTANEOUS
  Filled 2014-10-28 (×2): qty 0.4

## 2014-10-28 MED ORDER — IOHEXOL 300 MG/ML  SOLN
50.0000 mL | Freq: Once | INTRAMUSCULAR | Status: AC | PRN
  Administered 2014-10-28: 50 mL via ORAL

## 2014-10-28 MED ORDER — PROMETHAZINE HCL 25 MG/ML IJ SOLN
25.0000 mg | Freq: Once | INTRAMUSCULAR | Status: AC
  Administered 2014-10-28: 25 mg via INTRAVENOUS
  Filled 2014-10-28: qty 1

## 2014-10-28 MED ORDER — CEFTRIAXONE SODIUM 250 MG IJ SOLR: 250.0000 mg | Freq: Once | INTRAMUSCULAR | Status: DC

## 2014-10-28 MED ORDER — CIPROFLOXACIN IN D5W 400 MG/200ML IV SOLN
400.0000 mg | Freq: Two times a day (BID) | INTRAVENOUS | Status: DC
  Filled 2014-10-28: qty 200

## 2014-10-28 MED ORDER — FENTANYL CITRATE 0.05 MG/ML IJ SOLN
50.0000 ug | INTRAMUSCULAR | Status: DC | PRN
  Administered 2014-10-28 – 2014-10-29 (×6): 50 ug via INTRAVENOUS
  Filled 2014-10-28 (×6): qty 2

## 2014-10-28 MED ORDER — POTASSIUM CHLORIDE IN NACL 20-0.9 MEQ/L-% IV SOLN
INTRAVENOUS | Status: DC
  Administered 2014-10-28 – 2014-10-30 (×3): via INTRAVENOUS
  Filled 2014-10-28 (×6): qty 1000

## 2014-10-28 MED ORDER — FENTANYL CITRATE 0.05 MG/ML IJ SOLN
50.0000 ug | Freq: Once | INTRAMUSCULAR | Status: AC
  Administered 2014-10-28: 50 ug via INTRAVENOUS
  Filled 2014-10-28: qty 2

## 2014-10-28 MED ORDER — PIPERACILLIN-TAZOBACTAM 3.375 G IVPB 30 MIN
3.3750 g | INTRAVENOUS | Status: AC
  Administered 2014-10-28: 3.375 g via INTRAVENOUS
  Filled 2014-10-28: qty 50

## 2014-10-28 MED ORDER — ONDANSETRON HCL 4 MG PO TABS: 4.0000 mg | ORAL_TABLET | Freq: Four times a day (QID) | ORAL | Status: DC | PRN

## 2014-10-28 MED ORDER — ACETAMINOPHEN 325 MG PO TABS
650.0000 mg | ORAL_TABLET | Freq: Four times a day (QID) | ORAL | Status: DC | PRN
Start: 2014-10-28 — End: 2014-10-31

## 2014-10-28 MED ORDER — PROMETHAZINE HCL 25 MG/ML IJ SOLN
12.5000 mg | Freq: Once | INTRAMUSCULAR | Status: AC
  Administered 2014-10-28: 12.5 mg via INTRAVENOUS
  Filled 2014-10-28: qty 1

## 2014-10-28 MED ORDER — AZITHROMYCIN 250 MG PO TABS: 1000.0000 mg | ORAL_TABLET | Freq: Once | ORAL | Status: DC

## 2014-10-28 MED ORDER — PIPERACILLIN-TAZOBACTAM 3.375 G IVPB
3.3750 g | Freq: Three times a day (TID) | INTRAVENOUS | Status: DC
  Administered 2014-10-28 – 2014-10-31 (×8): 3.375 g via INTRAVENOUS
  Filled 2014-10-28 (×9): qty 50

## 2014-10-28 MED ORDER — SODIUM CHLORIDE 0.9 % IV BOLUS (SEPSIS)
1000.0000 mL | Freq: Once | INTRAVENOUS | Status: AC
  Administered 2014-10-28: 1000 mL via INTRAVENOUS

## 2014-10-28 MED ORDER — ONDANSETRON HCL 4 MG/2ML IJ SOLN: 4.0000 mg | Freq: Once | INTRAMUSCULAR | Status: DC

## 2014-10-28 MED ORDER — IOHEXOL 300 MG/ML  SOLN
100.0000 mL | Freq: Once | INTRAMUSCULAR | Status: AC | PRN
  Administered 2014-10-28: 100 mL via INTRAVENOUS

## 2014-10-28 NOTE — H&P (Signed)
[facility] History and Physical  Frances Mccormick OXB:[id number] DOB: 1986/04/19 DOA: 10/28/2014  Referring physician: Abdominal pain PCP: No PCP Per Patient   Chief Complaint: Intractable nausea and abdominal pain  HPI:  28 year old female with a history of Crohn's disease, followed by Dr. Carol Ada, presented on 11/26 with nausea vomiting and right-sided abdominal pain for several days. The patient had an elevated white count during that presentation of 21,000. Vomiting was described as nonbloody and nonbilious. No diarrhea. The patient has had a diagnosis of Crohn's disease since 2012 diagnosis via colonoscopy. 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 The patient has last been on therapy 1 year ago. She used to be on the lialda.  On 11/26 the patient was prescribed steroids and nausea medicine. This did not help and the patient came back today. She has persistently elevated white count. No bloody diarrhea in fact the patient is constipated with no BM in 2 days. She is passing gas. Found to be hypertensive in the ER      Review of Systems: negative for the following  Constitutional: Denies fever, chills, diaphoresis, appetite change and fatigue.  HEENT: Denies photophobia, eye pain, redness, hearing loss, ear pain, congestion, sore throat, rhinorrhea, sneezing, mouth sores, trouble swallowing, neck pain, neck stiffness and tinnitus.  Respiratory: Denies SOB, DOE, cough, chest tightness, and wheezing.  Cardiovascular: Denies chest pain, palpitations and leg swelling.  Gastrointestinal: Positive for nausea, vomiting, abdominal pain, diarrhea, constipation, blood in stool and abdominal distention.  Genitourinary: Denies dysuria, urgency, frequency, hematuria, flank pain and difficulty urinating.  Musculoskeletal: Denies myalgias, back pain, joint swelling, arthralgias and gait problem.  Skin: Denies pallor, rash and wound.  Neurological: Denies dizziness, seizures, syncope,  weakness, light-headedness, numbness and headaches.  Hematological: Denies adenopathy. Easy bruising, personal or family bleeding history  Psychiatric/Behavioral: Denies suicidal ideation, mood changes, confusion, nervousness, sleep disturbance and agitation       Past Medical History  Diagnosis Date  . Crohn's disease   . Non-compliant patient      Past Surgical History  Procedure Laterality Date  . Induced abortion        Social History:  reports that she has never smoked. She does not have any smokeless tobacco history on file. She reports that she drinks alcohol. She reports that she uses illicit drugs (Marijuana).    Allergies  Allergen Reactions  . Bee Venom Anaphylaxis  . Ciprofloxacin Anaphylaxis    No family history on file.   Prior to Admission medications   Medication Sig Start Date End Date Taking? Authorizing Provider  acetaminophen (TYLENOL) 500 MG tablet Take 1,000 mg by mouth every 6 (six) hours as needed for moderate pain.   Yes Historical Provider, MD  ibuprofen (ADVIL,MOTRIN) 200 MG tablet Take 400 mg by mouth every 6 (six) hours as needed for moderate pain.   Yes Historical Provider, MD  oxyCODONE-acetaminophen (PERCOCET/ROXICET) 5-325 MG per tablet Take 1 tablet by mouth every 4 (four) hours as needed for severe pain. 10/26/14  Yes Hoy Morn, MD  predniSONE (DELTASONE) 10 MG tablet Take 6 tablets (60 mg total) by mouth daily. 10/26/14  Yes Hoy Morn, MD  promethazine (PHENERGAN) 25 MG tablet Take 1 tablet (25 mg total) by mouth every 6 (six) hours as needed for nausea or vomiting. 10/26/14  Yes Hoy Morn, MD     Physical Exam: Filed Vitals:   10/28/14 0854 10/28/14 1005 10/28/14 1209 10/28/14 1451  BP:  138/88 157/83 114/66  Pulse:  87 69 91  Temp:  98.4 F (36.9 C) 98.6 F (37 C) 98.6 F (37 C)  TempSrc:  Oral Oral Oral  Resp:  16 18 18   Height: 5' (1.524 m)   5' (1.524 m)  Weight: 72.576 kg (160 lb)   73.1 kg (161 lb 2.5  oz)  SpO2:  100% 99% 100%     Constitutional: Vital signs reviewed. Patient is a well-developed and well-nourished in no acute distress and cooperative with exam. Alert and oriented x3.  Head: Normocephalic and atraumatic  Ear: TM normal bilaterally  Mouth: no erythema or exudates, MMM  Eyes: PERRL, EOMI, conjunctivae normal, No scleral icterus.  Neck: Supple, Trachea midline normal ROM, No JVD, mass, thyromegaly, or carotid bruit present.  Cardiovascular: RRR, S1 normal, S2 normal, no MRG, pulses symmetric and intact bilaterally  Pulmonary/Chest: CTAB, no wheezes, rales, or rhonchi  Abdominal: Mild right-sided abdominal tenderness without guarding or rebound  GU: no CVA tenderness Musculoskeletal: No joint deformities, erythema, or stiffness, ROM full and no nontender Ext: no edema and no cyanosis, pulses palpable bilaterally (DP and PT)  Hematology: no cervical, inginal, or axillary adenopathy.  Neurological: A&O x3, Strenght is normal and symmetric bilaterally, cranial nerve II-XII are grossly intact, no focal motor deficit, sensory intact to light touch bilaterally.  Skin: Warm, dry and intact. No rash, cyanosis, or clubbing.  Psychiatric: Normal mood and affect. speech and behavior is normal. Judgment and thought content normal. Cognition and memory are normal.       Labs on Admission:    Basic Metabolic Panel:  Recent Labs Lab 10/26/14 0942 10/28/14 0600 10/28/14 0611  NA 133*  --  137  K 3.9  --  3.5*  CL 95*  --  98  CO2 20  --  25  GLUCOSE 103*  --  121*  BUN 8  --  15  CREATININE 0.60  --  0.88  CALCIUM 9.5  --  9.1  MG  --  2.0  --    Liver Function Tests:  Recent Labs Lab 10/26/14 0942 10/28/14 0611  AST 31 19  ALT 19 14  ALKPHOS 93 81  BILITOT 0.3 0.2*  PROT 9.0* 8.6*  ALBUMIN 4.6 4.2    Recent Labs Lab 10/28/14 0611  LIPASE 38   No results for input(s): AMMONIA in the last 168 hours. CBC:  Recent Labs Lab 10/26/14 0942 10/28/14 0611   WBC 21.9* 20.6*  NEUTROABS 19.8* 17.0*  HGB 15.9* 15.1*  HCT 47.0* 44.3  MCV 79.9 78.3  PLT 280 366   Cardiac Enzymes: No results for input(s): CKTOTAL, CKMB, CKMBINDEX, TROPONINI in the last 168 hours.  BNP (last 3 results) No results for input(s): PROBNP in the last 8760 hours.    CBG: No results for input(s): GLUCAP in the last 168 hours.  Radiological Exams on Admission: Ct Abdomen Pelvis W Contrast  10/28/2014   CLINICAL DATA:  28 year old with right-sided pain and history of Crohn's disease. New episodes of emesis.  EXAM: CT ABDOMEN AND PELVIS WITH CONTRAST  TECHNIQUE: Multidetector CT imaging of the abdomen and pelvis was performed using the standard protocol following bolus administration of intravenous contrast.  CONTRAST:  2m OMNIPAQUE IOHEXOL 300 MG/ML SOLN, 1060mOMNIPAQUE IOHEXOL 300 MG/ML SOLN  COMPARISON:  07/08/2014  FINDINGS: Lung bases are clear.  No evidence for free air.  There is heterogeneity in the left hepatic lobe near the falciform ligament. This is a chronic finding for this patient and  could be related to focal fat. No acute abnormality within the liver. Portal venous system is patent and the gallbladder appears normal. Normal appearance of the pancreas, spleen, adrenal glands and both kidneys. There is no significant lymphadenopathy in the abdomen or pelvis. Small amount of free fluid in the pelvis could be physiologic. No gross abnormality to the uterus or adnexal structures. Fluid in the urinary bladder.  There is a retrocecal appendix without acute inflammatory changes. Appendix is mildly prominent for size and again noted is a calcification near the tip. The appearance of the appendix is unchanged from prior examinations. There is no significant small bowel dilatation or bowel inflammation. There is minimal wall thickening involving the sigmoid colon but this appears similar to prior examinations. No evidence for acute colonic inflammation.  No acute bone  abnormality.  IMPRESSION: No acute abnormality within the abdomen or pelvis.  Small amount of fluid in the pelvis could be physiologic.   Electronically Signed   By: Markus Daft M.D.   On: 10/28/2014 08:03    EKG: Independently reviewed.  Assessment/Plan Active Problems:   Intractable vomiting with nausea   Intractable nausea vomiting Differential diagnosis includes Crohn's exacerbation vs viral gastroenteritis Patient will be admitted for IV hydration We'll start the patient on IV Solu-Medrol and Zosyn Patient will be restarted on lialda if symptoms improve We'll consult case management to establish her with gastroenterology as the patient lost follow-up due to loss of insurance  Hypokalemia Replete    Code Status:   full Family Communication: bedside Disposition Plan: admit   Time spent: 70 mins   [facility] Pager [phone]  If 7PM-7AM, please contact night-coverage [email or web address] Password [facility] 10/28/2014, 3:57 PM

## 2014-10-28 NOTE — ED Notes (Signed)
Pt brought to ED by mother, pt reports emesis with bright red blood onset 0200 am, pt currently vomiting clear in triage. Pt states she was here for same 2 days ago but had stopped vomiting until last night.

## 2014-10-28 NOTE — ED Notes (Signed)
Pt informed of need for urine specimen however she states she cannot urinate at this time.

## 2014-10-28 NOTE — ED Notes (Signed)
Pt is actively vomiting bile.

## 2014-10-28 NOTE — ED Provider Notes (Signed)
The patient is a 28 year old female, history of Crohn's disease, this is her second visit in the last several days for right-sided abdominal pain which seems to be getting worse. She has associated multiple episodes of emesis. She denies any stools in the last 2 days and on exam has tenderness in the right side of the abdomen especially the right lower and suprapubic areas. There are no peritoneal signs, her abdomen is not rigid, it is very soft and her heart and lung sounds are normal. Labs and imaging pending, pain medications fluids and antiemetics.  Medical screening examination/treatment/procedure(s) were conducted as a shared visit with non-physician practitioner(s) and myself.  I personally evaluated the patient during the encounter.  Clinical Impression:   Final diagnoses:  Abdominal pain  Crohn's disease  Intractable vomiting with nausea, vomiting of unspecified type         Johnna Acosta, MD 10/28/14 2343

## 2014-10-28 NOTE — ED Notes (Signed)
I have just given report to [facility]--will transport shortly.

## 2014-10-28 NOTE — ED Provider Notes (Signed)
CSN: [id number]     Arrival date & time 10/28/14  0532 History   First MD Initiated Contact with Patient 10/28/14 [phone]     Chief Complaint  Patient presents with  . Emesis   Frances Mccormick is a 28 y.o. female with a history of Crohn's disease presents ED complaining of vomiting and abdominal pain since 3 AM this morning. Patient which feels like she is having another flareup of her Crohn's disease. She reports she started vomiting 3 AM this morning and noticed some of her vomit bright red blood streaking. She is currently vomiting clear. Patient is complaining of suprapubic pain that radiates to her right lower quadrant. She rates her pain at 10 out of 10 and describes it as a cramp. Patient reports she last took Phenergan at 9 PM last night. She reports her friend does not usually work for her. She reports she last took pain medicine 6:30 last night. Patient also reports associated chills and nausea. Patient's last menstrual cycle was one week ago. Patient reports she occasionally drinks alcohol and the last time she drank was one month ago. Patient was previously seeing gastroenterologist for Crohn's disease but has not seen them since losing her insurance. Patient denies fevers, diarrhea, hematochezia, dysuria, hematuria, urinary urgency, chest pain, shortness of breath, or cough. Patient denies previous abdominal surgeries.  (Consider location/radiation/quality/duration/timing/severity/associated sxs/prior Treatment) HPI  Past Medical History  Diagnosis Date  . Crohn's disease   . Non-compliant patient    Past Surgical History  Procedure Laterality Date  . Induced abortion     No family history on file. History  Substance Use Topics  . Smoking status: Never Smoker   . Smokeless tobacco: Not on file  . Alcohol Use: Yes     Comment: ocassionally   OB History    No data available     Review of Systems  Constitutional: Positive for chills. Negative for fever.  HENT: Negative for  congestion, ear pain, postnasal drip, sneezing, sore throat and trouble swallowing.   Eyes: Negative for redness and visual disturbance.  Respiratory: Negative for cough, shortness of breath and wheezing.   Cardiovascular: Negative for chest pain and palpitations.  Gastrointestinal: Positive for nausea, vomiting and abdominal pain. Negative for diarrhea and blood in stool.  Genitourinary: Negative for dysuria, urgency, frequency, hematuria, flank pain, vaginal bleeding and difficulty urinating.  Musculoskeletal: Negative for back pain and neck pain.  Skin: Negative for rash and wound.  Neurological: Negative for weakness, light-headedness and headaches.  All other systems reviewed and are negative.     Allergies  Bee venom and Ciprofloxacin  Home Medications   Prior to Admission medications   Medication Sig Start Date End Date Taking? Authorizing Provider  acetaminophen (TYLENOL) 500 MG tablet Take 1,000 mg by mouth every 6 (six) hours as needed for moderate pain.   Yes Historical Provider, MD  ibuprofen (ADVIL,MOTRIN) 200 MG tablet Take 400 mg by mouth every 6 (six) hours as needed for moderate pain.   Yes Historical Provider, MD  oxyCODONE-acetaminophen (PERCOCET/ROXICET) 5-325 MG per tablet Take 1 tablet by mouth every 4 (four) hours as needed for severe pain. 10/26/14  Yes Hoy Morn, MD  predniSONE (DELTASONE) 10 MG tablet Take 6 tablets (60 mg total) by mouth daily. 10/26/14  Yes Hoy Morn, MD  promethazine (PHENERGAN) 25 MG tablet Take 1 tablet (25 mg total) by mouth every 6 (six) hours as needed for nausea or vomiting. 10/26/14  Yes Hoy Morn,  MD   BP 114/66 mmHg  Pulse 91  Temp(Src) 98.6 F (37 C) (Oral)  Resp 18  Ht 5' (1.524 m)  Wt 161 lb 2.5 oz (73.1 kg)  BMI 31.47 kg/m2  SpO2 100%  LMP 10/21/2014 Physical Exam  Constitutional: She appears well-developed and well-nourished. No distress.  HENT:  Head: Normocephalic and atraumatic.  Right Ear: External  ear normal.  Left Ear: External ear normal.  Nose: Nose normal.  Mouth/Throat: Oropharynx is clear and moist. No oropharyngeal exudate.  Eyes: Conjunctivae are normal. Pupils are equal, round, and reactive to light. Right eye exhibits no discharge. Left eye exhibits no discharge.  Neck: Neck supple.  Cardiovascular: Normal rate, regular rhythm, normal heart sounds and intact distal pulses.  Exam reveals no gallop and no friction rub.   No murmur heard. Pulmonary/Chest: Effort normal and breath sounds normal. No respiratory distress. She has no wheezes. She has no rales.  Abdominal: Soft. She exhibits no distension and no mass. There is tenderness. There is no rebound and no guarding.  Abdomen is soft. Bowel sounds are hypoactive. Patient has suprapubic tenderness as well as right and left lower quadrant tenderness. No rebound. Negative Rovsing sign. Negative psoas and obturator sign. Negative Murphy sign.  Musculoskeletal: She exhibits no edema.  Lymphadenopathy:    She has no cervical adenopathy.  Neurological: She is alert. Coordination normal.  Skin: Skin is warm and dry. No rash noted. She is not diaphoretic. No erythema. No pallor.  Psychiatric: She has a normal mood and affect. Her behavior is normal.  Nursing note and vitals reviewed.   ED Course  Procedures (including critical care time) Labs Review Labs Reviewed  CBC WITH DIFFERENTIAL - Abnormal; Notable for the following:    WBC 20.6 (*)    RBC 5.66 (*)    Hemoglobin 15.1 (*)    Neutrophils Relative % 83 (*)    Neutro Abs 17.0 (*)    All other components within normal limits  COMPREHENSIVE METABOLIC PANEL - Abnormal; Notable for the following:    Potassium 3.5 (*)    Glucose, Bld 121 (*)    Total Protein 8.6 (*)    Total Bilirubin 0.2 (*)    GFR calc non Af Amer 89 (*)    All other components within normal limits  URINALYSIS, ROUTINE W REFLEX MICROSCOPIC - Abnormal; Notable for the following:    APPearance CLOUDY (*)     Hgb urine dipstick SMALL (*)    All other components within normal limits  URINE MICROSCOPIC-ADD ON - Abnormal; Notable for the following:    Squamous Epithelial / LPF MANY (*)    Bacteria, UA MANY (*)    All other components within normal limits  CLOSTRIDIUM DIFFICILE BY PCR  LIPASE, BLOOD  PREGNANCY, URINE  MAGNESIUM    Imaging Review Ct Abdomen Pelvis W Contrast  10/28/2014   CLINICAL DATA:  28 year old with right-sided pain and history of Crohn's disease. New episodes of emesis.  EXAM: CT ABDOMEN AND PELVIS WITH CONTRAST  TECHNIQUE: Multidetector CT imaging of the abdomen and pelvis was performed using the standard protocol following bolus administration of intravenous contrast.  CONTRAST:  52mL OMNIPAQUE IOHEXOL 300 MG/ML SOLN, 159mL OMNIPAQUE IOHEXOL 300 MG/ML SOLN  COMPARISON:  07/08/2014  FINDINGS: Lung bases are clear.  No evidence for free air.  There is heterogeneity in the left hepatic lobe near the falciform ligament. This is a chronic finding for this patient and could be related to focal fat. No acute  abnormality within the liver. Portal venous system is patent and the gallbladder appears normal. Normal appearance of the pancreas, spleen, adrenal glands and both kidneys. There is no significant lymphadenopathy in the abdomen or pelvis. Small amount of free fluid in the pelvis could be physiologic. No gross abnormality to the uterus or adnexal structures. Fluid in the urinary bladder.  There is a retrocecal appendix without acute inflammatory changes. Appendix is mildly prominent for size and again noted is a calcification near the tip. The appearance of the appendix is unchanged from prior examinations. There is no significant small bowel dilatation or bowel inflammation. There is minimal wall thickening involving the sigmoid colon but this appears similar to prior examinations. No evidence for acute colonic inflammation.  No acute bone abnormality.  IMPRESSION: No acute abnormality  within the abdomen or pelvis.  Small amount of fluid in the pelvis could be physiologic.   Electronically Signed   By: Markus Daft M.D.   On: 10/28/2014 08:03     EKG Interpretation None      Filed Vitals:   10/28/14 0854 10/28/14 1005 10/28/14 1209 10/28/14 1451  BP:  138/88 157/83 114/66  Pulse:  87 69 91  Temp:  98.4 F (36.9 C) 98.6 F (37 C) 98.6 F (37 C)  TempSrc:  Oral Oral Oral  Resp:  16 18 18   Height: 5' (1.524 m)   5' (1.524 m)  Weight: 160 lb (72.576 kg)   161 lb 2.5 oz (73.1 kg)  SpO2:  100% 99% 100%     MDM   Meds given in ED:  Medications  enoxaparin (LOVENOX) injection 40 mg (40 mg Subcutaneous Given 10/28/14 1613)  0.9 % NaCl with KCl 20 mEq/ L  infusion ( Intravenous New Bag/Given 10/28/14 1459)  acetaminophen (TYLENOL) tablet 650 mg (not administered)    Or  acetaminophen (TYLENOL) suppository 650 mg (not administered)  ondansetron (ZOFRAN) tablet 4 mg ( Oral See Alternative 10/28/14 1514)    Or  ondansetron (ZOFRAN) injection 4 mg (4 mg Intravenous Given 10/28/14 1514)  methylPREDNISolone sodium succinate (SOLU-MEDROL) 40 mg/mL injection 40 mg (40 mg Intravenous Given 10/28/14 1416)  fentaNYL (SUBLIMAZE) injection 50 mcg (50 mcg Intravenous Given 10/28/14 1513)  piperacillin-tazobactam (ZOSYN) IVPB 3.375 g (not administered)  piperacillin-tazobactam (ZOSYN) IVPB 3.375 g (not administered)  promethazine (PHENERGAN) injection 25 mg (25 mg Intravenous Given 10/28/14 0636)  sodium chloride 0.9 % bolus 1,000 mL (0 mLs Intravenous Stopped 10/28/14 0903)  fentaNYL (SUBLIMAZE) injection 50 mcg (50 mcg Intravenous Given 10/28/14 0643)  iohexol (OMNIPAQUE) 300 MG/ML solution 50 mL (50 mLs Oral Contrast Given 10/28/14 0659)  iohexol (OMNIPAQUE) 300 MG/ML solution 100 mL (100 mLs Intravenous Contrast Given 10/28/14 0729)  promethazine (PHENERGAN) injection 12.5 mg (12.5 mg Intravenous Given 10/28/14 0903)  metoCLOPramide (REGLAN) injection 10 mg (10 mg  Intravenous Given 10/28/14 1024)  morphine 4 MG/ML injection 6 mg (6 mg Intravenous Given by Other 10/28/14 1250)    Current Discharge Medication List      Final diagnoses:  Abdominal pain  Crohn's disease  Intractable vomiting with nausea, vomiting of unspecified type    Frances Mccormick is a 28 y.o. female with a history of Crohn's disease presents ED complaining of vomiting and abdominal pain since 3 AM this morning. Patient was seen here in the ED on 10/26/2014 for a Crohn's flare. She reports her pain feels just like her Crohn's flare. The patient has been unable to see her GI after loosing her insurance.  The patient was started on prednisone from her last visit in the ED. Patient elevated white count at 20.6. The patient had no acute abnormality on her CT abdomen and pelvis.  10:00 AM The patient is still vomiting after a second dose of Phenergan. The patient is unable to keep down ginger ale. Will try Reglan and see if she can keep down water.  12:06 PM the patient is still unable to keep down water after Reglan. Still having some abdominal pain.  Patient has been unable to keep any fluids down with Phenergan or Reglan. Patient reports Zofran does not work for her. Dr. Cathleen Fears will consult for admission due to her intractable vomiting. The patient agrees with admission at this time.  Patient was discussed with and evaluated by Dr. Cathleen Fears who agrees with assessment and plan.      Hanley Hays, PA-C 10/28/14 1620  Orlie Dakin, MD 10/28/14 [phone]

## 2014-10-28 NOTE — Progress Notes (Signed)
ANTIBIOTIC CONSULT NOTE - INITIAL  Pharmacy Consult for Zosyn Indication: intra-abdominal infection  Allergies  Allergen Reactions  . Bee Venom Anaphylaxis  . Ciprofloxacin Anaphylaxis    Patient Measurements: Height: 5' (152.4 cm) Weight: 161 lb 2.5 oz (73.1 kg) IBW/kg (Calculated) : 45.5  Vital Signs: Temp: 98.6 F (37 C) (11/28 1451) Temp Source: Oral (11/28 1451) BP: 114/66 mmHg (11/28 1451) Pulse Rate: 91 (11/28 1451) Intake/Output from previous day:    Labs:  Recent Labs  10/26/14 0942 10/28/14 0611  WBC 21.9* 20.6*  HGB 15.9* 15.1*  PLT 280 366  CREATININE 0.60 0.88   Estimated Creatinine Clearance: 84.9 mL/min (by C-G formula based on Cr of 0.88). No results for input(s): VANCOTROUGH, VANCOPEAK, VANCORANDOM, GENTTROUGH, GENTPEAK, GENTRANDOM, TOBRATROUGH, TOBRAPEAK, TOBRARND, AMIKACINPEAK, AMIKACINTROU, AMIKACIN in the last 72 hours.   Microbiology: No results found for this or any previous visit (from the past 720 hour(s)).  Medical History: Past Medical History  Diagnosis Date  . Crohn's disease   . Non-compliant patient     Assessment: 63 [facility] with recurrent, worsening right-sided abdominal pain, emesis.PMH significant for Crohn's disease.  Pharmacy is consulted to dose Zosyn for suspected intra-abdominal infection.    11/28 >> metronidazole >> 11/28 11/28 >> Zosyn >>  Today, 10/28/2014   Tmax: afebrile  WBCs: 20.6  Renal: SCr 0.88, CrCl ~ 85 ml/min  Goal of Therapy:  Appropriate abx dosing, eradication of infection.   Plan:   Zosyn 3.375g IV Q8H infused over 4hrs.  Follow up renal function and cultures as available.  Gretta Arab PharmD, BCPS Pager [phone] 10/28/2014 4:12 PM

## 2014-10-28 NOTE — ED Provider Notes (Signed)
Patient continues to vomit after 4 doses of antiemetics. She continues to complain of abdominal pain, 8 on a skilled 1-10. Morphine ordered Results for orders placed or performed during the hospital encounter of 10/28/14  CBC with Differential  Result Value Ref Range   WBC 20.6 (H) 4.0 - 10.5 K/uL   RBC 5.66 (H) 3.87 - 5.11 MIL/uL   Hemoglobin 15.1 (H) 12.0 - 15.0 g/dL   HCT 44.3 36.0 - 46.0 %   MCV 78.3 78.0 - 100.0 fL   MCH 26.7 26.0 - 34.0 pg   MCHC 34.1 30.0 - 36.0 g/dL   RDW 15.3 11.5 - 15.5 %   Platelets 366 150 - 400 K/uL   Neutrophils Relative % 83 (H) 43 - 77 %   Neutro Abs 17.0 (H) 1.7 - 7.7 K/uL   Lymphocytes Relative 12 12 - 46 %   Lymphs Abs 2.4 0.7 - 4.0 K/uL   Monocytes Relative 5 3 - 12 %   Monocytes Absolute 1.0 0.1 - 1.0 K/uL   Eosinophils Relative 0 0 - 5 %   Eosinophils Absolute 0.1 0.0 - 0.7 K/uL   Basophils Relative 0 0 - 1 %   Basophils Absolute 0.1 0.0 - 0.1 K/uL  Comprehensive metabolic panel  Result Value Ref Range   Sodium 137 137 - 147 mEq/L   Potassium 3.5 (L) 3.7 - 5.3 mEq/L   Chloride 98 96 - 112 mEq/L   CO2 25 19 - 32 mEq/L   Glucose, Bld 121 (H) 70 - 99 mg/dL   BUN 15 6 - 23 mg/dL   Creatinine, Ser 0.88 0.50 - 1.10 mg/dL   Calcium 9.1 8.4 - 10.5 mg/dL   Total Protein 8.6 (H) 6.0 - 8.3 g/dL   Albumin 4.2 3.5 - 5.2 g/dL   AST 19 0 - 37 U/L   ALT 14 0 - 35 U/L   Alkaline Phosphatase 81 39 - 117 U/L   Total Bilirubin 0.2 (L) 0.3 - 1.2 mg/dL   GFR calc non Af Amer 89 (L) >90 mL/min   GFR calc Af Amer >90 >90 mL/min   Anion gap 14 5 - 15  Lipase, blood  Result Value Ref Range   Lipase 38 11 - 59 U/L  Pregnancy, urine  Result Value Ref Range   Preg Test, Ur NEGATIVE NEGATIVE  Urinalysis, Routine w reflex microscopic  Result Value Ref Range   Color, Urine YELLOW YELLOW   APPearance CLOUDY (A) CLEAR   Specific Gravity, Urine 1.016 1.005 - 1.030   pH 6.0 5.0 - 8.0   Glucose, UA NEGATIVE NEGATIVE mg/dL   Hgb urine dipstick SMALL (A)  NEGATIVE   Bilirubin Urine NEGATIVE NEGATIVE   Ketones, ur NEGATIVE NEGATIVE mg/dL   Protein, ur NEGATIVE NEGATIVE mg/dL   Urobilinogen, UA 0.2 0.0 - 1.0 mg/dL   Nitrite NEGATIVE NEGATIVE   Leukocytes, UA NEGATIVE NEGATIVE  Urine microscopic-add on  Result Value Ref Range   Squamous Epithelial / LPF MANY (A) RARE   RBC / HPF 0-2 <3 RBC/hpf   Bacteria, UA MANY (A) RARE   Ct Abdomen Pelvis W Contrast  10/28/2014   CLINICAL DATA:  28 year old with right-sided pain and history of Crohn's disease. New episodes of emesis.  EXAM: CT ABDOMEN AND PELVIS WITH CONTRAST  TECHNIQUE: Multidetector CT imaging of the abdomen and pelvis was performed using the standard protocol following bolus administration of intravenous contrast.  CONTRAST:  15mL OMNIPAQUE IOHEXOL 300 MG/ML SOLN, 123mL OMNIPAQUE IOHEXOL 300 MG/ML  SOLN  COMPARISON:  07/08/2014  FINDINGS: Lung bases are clear.  No evidence for free air.  There is heterogeneity in the left hepatic lobe near the falciform ligament. This is a chronic finding for this patient and could be related to focal fat. No acute abnormality within the liver. Portal venous system is patent and the gallbladder appears normal. Normal appearance of the pancreas, spleen, adrenal glands and both kidneys. There is no significant lymphadenopathy in the abdomen or pelvis. Small amount of free fluid in the pelvis could be physiologic. No gross abnormality to the uterus or adnexal structures. Fluid in the urinary bladder.  There is a retrocecal appendix without acute inflammatory changes. Appendix is mildly prominent for size and again noted is a calcification near the tip. The appearance of the appendix is unchanged from prior examinations. There is no significant small bowel dilatation or bowel inflammation. There is minimal wall thickening involving the sigmoid colon but this appears similar to prior examinations. No evidence for acute colonic inflammation.  No acute bone abnormality.   IMPRESSION: No acute abnormality within the abdomen or pelvis.  Small amount of fluid in the pelvis could be physiologic.   Electronically Signed   By: Markus Daft M.D.   On: 10/28/2014 08:03   Spoke with Dr. Albertha Ghee who will arrange for inpt stay.  Orlie Dakin, MD 10/28/14 1250

## 2014-10-29 LAB — COMPREHENSIVE METABOLIC PANEL
ALT: 12 U/L (ref 0–35)
ANION GAP: 15 (ref 5–15)
AST: 18 U/L (ref 0–37)
Albumin: 4 g/dL (ref 3.5–5.2)
Alkaline Phosphatase: 75 U/L (ref 39–117)
BUN: 7 mg/dL (ref 6–23)
CALCIUM: 9.2 mg/dL (ref 8.4–10.5)
CO2: 23 mEq/L (ref 19–32)
CREATININE: 0.74 mg/dL (ref 0.50–1.10)
Chloride: 98 mEq/L (ref 96–112)
GLUCOSE: 109 mg/dL — AB (ref 70–99)
Potassium: 3.5 mEq/L — ABNORMAL LOW (ref 3.7–5.3)
Sodium: 136 mEq/L — ABNORMAL LOW (ref 137–147)
Total Bilirubin: 0.5 mg/dL (ref 0.3–1.2)
Total Protein: 8.2 g/dL (ref 6.0–8.3)

## 2014-10-29 LAB — CBC
HCT: 43.4 % (ref 36.0–46.0)
Hemoglobin: 14.5 g/dL (ref 12.0–15.0)
MCH: 25.8 pg — ABNORMAL LOW (ref 26.0–34.0)
MCHC: 33.4 g/dL (ref 30.0–36.0)
MCV: 77.2 fL — ABNORMAL LOW (ref 78.0–100.0)
PLATELETS: 326 10*3/uL (ref 150–400)
RBC: 5.62 MIL/uL — AB (ref 3.87–5.11)
RDW: 15.1 % (ref 11.5–15.5)
WBC: 17.2 10*3/uL — ABNORMAL HIGH (ref 4.0–10.5)

## 2014-10-29 LAB — RAPID URINE DRUG SCREEN, HOSP PERFORMED
Amphetamines: NOT DETECTED
Barbiturates: NOT DETECTED
Benzodiazepines: NOT DETECTED
COCAINE: NOT DETECTED
OPIATES: NOT DETECTED
TETRAHYDROCANNABINOL: POSITIVE — AB

## 2014-10-29 MED ORDER — OXYCODONE HCL ER 10 MG PO T12A
10.0000 mg | EXTENDED_RELEASE_TABLET | Freq: Two times a day (BID) | ORAL | Status: DC
  Administered 2014-10-29 – 2014-10-31 (×5): 10 mg via ORAL
  Filled 2014-10-29 (×5): qty 1

## 2014-10-29 MED ORDER — METHYLPREDNISOLONE SODIUM SUCC 125 MG IJ SOLR
60.0000 mg | Freq: Every day | INTRAMUSCULAR | Status: DC
  Administered 2014-10-30 – 2014-10-31 (×2): 60 mg via INTRAVENOUS
  Filled 2014-10-29 (×2): qty 0.96

## 2014-10-29 MED ORDER — PROMETHAZINE HCL 25 MG/ML IJ SOLN
12.5000 mg | Freq: Four times a day (QID) | INTRAMUSCULAR | Status: DC | PRN
  Administered 2014-10-29: 12.5 mg via INTRAVENOUS
  Filled 2014-10-29: qty 1

## 2014-10-29 MED ORDER — PROMETHAZINE HCL 25 MG/ML IJ SOLN
25.0000 mg | Freq: Four times a day (QID) | INTRAMUSCULAR | Status: DC | PRN
  Administered 2014-10-29 (×2): 25 mg via INTRAVENOUS
  Filled 2014-10-29 (×2): qty 1

## 2014-10-29 MED ORDER — MORPHINE SULFATE 2 MG/ML IJ SOLN
1.0000 mg | Freq: Once | INTRAMUSCULAR | Status: AC
  Administered 2014-10-29: 1 mg via INTRAVENOUS
  Filled 2014-10-29: qty 1

## 2014-10-29 MED ORDER — MORPHINE SULFATE 2 MG/ML IJ SOLN
1.0000 mg | INTRAMUSCULAR | Status: DC | PRN
  Administered 2014-10-29 – 2014-10-30 (×3): 1 mg via INTRAVENOUS
  Filled 2014-10-29 (×4): qty 1

## 2014-10-29 NOTE — Progress Notes (Signed)
[facility] Progress note  Frances Mccormick OMV:[id number] DOB: [birth year] DOA: 10/28/2014  Referring physician: Abdominal pain PCP: No PCP Per Patient   Chief Complaint: Intractable nausea and abdominal pain  HPI:  28 year old female with a history of Crohn's disease, followed by Dr. Carol Ada, presented on 11/26 with nausea vomiting and right-sided abdominal pain for several days. The patient had an elevated white count during that presentation of 21,000. Vomiting was described as nonbloody and nonbilious. No diarrhea. The patient has had a diagnosis of Crohn's disease since 2012 diagnosis via colonoscopy. 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 The patient has last been on therapy 1 year ago. She used to be on the lialda.  On 11/26 the patient was prescribed steroids and nausea medicine. This did not help and the patient came back today. She has persistently elevated white count. No bloody diarrhea in fact the patient is constipated with no BM in 2 days. She is passing gas. Found to be hypertensive in the ER   Assessment and plan Intractable nausea vomiting Differential diagnosis includes Crohn's exacerbation vs viral gastroenteritis Patient will be admitted for IV hydration Continue on IV Solu-Medrol and Zosyn Patient will be restarted on lialda if symptoms improve, if no improvement GI consultation tomorrow Pain regimen adjusted with morphine and OxyContin We'll check a UDS to rule out marijuana use Advance diet as tolerated  Hypokalemia Repleted   Disposition anticipate discharge tomorrow if symptoms improve      Allergies  Allergen Reactions  . Bee Venom Anaphylaxis  . Ciprofloxacin Anaphylaxis    No family history on file.   Prior to Admission medications   Medication Sig Start Date End Date Taking? Authorizing Provider  acetaminophen (TYLENOL) 500 MG tablet Take 1,000 mg by mouth every 6 (six) hours as needed for moderate pain.   Yes Historical  Provider, MD  ibuprofen (ADVIL,MOTRIN) 200 MG tablet Take 400 mg by mouth every 6 (six) hours as needed for moderate pain.   Yes Historical Provider, MD  oxyCODONE-acetaminophen (PERCOCET/ROXICET) 5-325 MG per tablet Take 1 tablet by mouth every 4 (four) hours as needed for severe pain. 10/26/14  Yes Hoy Morn, MD  predniSONE (DELTASONE) 10 MG tablet Take 6 tablets (60 mg total) by mouth daily. 10/26/14  Yes Hoy Morn, MD  promethazine (PHENERGAN) 25 MG tablet Take 1 tablet (25 mg total) by mouth every 6 (six) hours as needed for nausea or vomiting. 10/26/14  Yes Hoy Morn, MD     Physical Exam: Filed Vitals:   10/28/14 1209 10/28/14 1451 10/28/14 2118 10/29/14 0512  BP: 157/83 114/66 121/74 163/100  Pulse: 69 91 66 69  Temp: 98.6 F (37 C) 98.6 F (37 C) 99.1 F (37.3 C) 98.1 F (36.7 C)  TempSrc: Oral Oral Oral Oral  Resp: 18 18 18 18   Height:  5' (1.524 m)    Weight:  73.1 kg (161 lb 2.5 oz)    SpO2: 99% 100% 100% 100%     Constitutional: Vital signs reviewed. Patient is a well-developed and well-nourished in no acute distress and cooperative with exam. Alert and oriented x3.  Head: Normocephalic and atraumatic  Ear: TM normal bilaterally  Mouth: no erythema or exudates, MMM  Eyes: PERRL, EOMI, conjunctivae normal, No scleral icterus.  Neck: Supple, Trachea midline normal ROM, No JVD, mass, thyromegaly, or carotid bruit present.  Cardiovascular: RRR, S1 normal, S2 normal, no MRG, pulses symmetric and intact bilaterally  Pulmonary/Chest: CTAB, no wheezes, rales, or  rhonchi  Abdominal: Mild right-sided abdominal tenderness without guarding or rebound  GU: no CVA tenderness Musculoskeletal: No joint deformities, erythema, or stiffness, ROM full and no nontender Ext: no edema and no cyanosis, pulses palpable bilaterally (DP and PT)  Hematology: no cervical, inginal, or axillary adenopathy.  Neurological: A&O x3, Strenght is normal and symmetric bilaterally, cranial  nerve II-XII are grossly intact, no focal motor deficit, sensory intact to light touch bilaterally.  Skin: Warm, dry and intact. No rash, cyanosis, or clubbing.  Psychiatric: Normal mood and affect. speech and behavior is normal. Judgment and thought content normal. Cognition and memory are normal.       Labs on Admission:    Basic Metabolic Panel:  Recent Labs Lab 10/26/14 0942 10/28/14 0600 10/28/14 0611 10/29/14 0615  NA 133*  --  137 136*  K 3.9  --  3.5* 3.5*  CL 95*  --  98 98  CO2 20  --  25 23  GLUCOSE 103*  --  121* 109*  BUN 8  --  15 7  CREATININE 0.60  --  0.88 0.74  CALCIUM 9.5  --  9.1 9.2  MG  --  2.0  --   --    Liver Function Tests:  Recent Labs Lab 10/26/14 0942 10/28/14 0611 10/29/14 0615  AST 31 19 18   ALT 19 14 12   ALKPHOS 93 81 75  BILITOT 0.3 0.2* 0.5  PROT 9.0* 8.6* 8.2  ALBUMIN 4.6 4.2 4.0    Recent Labs Lab 10/28/14 0611  LIPASE 38   No results for input(s): AMMONIA in the last 168 hours. CBC:  Recent Labs Lab 10/26/14 0942 10/28/14 0611 10/29/14 0615  WBC 21.9* 20.6* 17.2*  NEUTROABS 19.8* 17.0*  --   HGB 15.9* 15.1* 14.5  HCT 47.0* 44.3 43.4  MCV 79.9 78.3 77.2*  PLT 280 366 326   Cardiac Enzymes: No results for input(s): CKTOTAL, CKMB, CKMBINDEX, TROPONINI in the last 168 hours.  BNP (last 3 results) No results for input(s): PROBNP in the last 8760 hours.    CBG: No results for input(s): GLUCAP in the last 168 hours.  Radiological Exams on Admission: Ct Abdomen Pelvis W Contrast  10/28/2014   CLINICAL DATA:  28 year old with right-sided pain and history of Crohn's disease. New episodes of emesis.  EXAM: CT ABDOMEN AND PELVIS WITH CONTRAST  TECHNIQUE: Multidetector CT imaging of the abdomen and pelvis was performed using the standard protocol following bolus administration of intravenous contrast.  CONTRAST:  69m OMNIPAQUE IOHEXOL 300 MG/ML SOLN, 1071mOMNIPAQUE IOHEXOL 300 MG/ML SOLN  COMPARISON:  07/08/2014   FINDINGS: Lung bases are clear.  No evidence for free air.  There is heterogeneity in the left hepatic lobe near the falciform ligament. This is a chronic finding for this patient and could be related to focal fat. No acute abnormality within the liver. Portal venous system is patent and the gallbladder appears normal. Normal appearance of the pancreas, spleen, adrenal glands and both kidneys. There is no significant lymphadenopathy in the abdomen or pelvis. Small amount of free fluid in the pelvis could be physiologic. No gross abnormality to the uterus or adnexal structures. Fluid in the urinary bladder.  There is a retrocecal appendix without acute inflammatory changes. Appendix is mildly prominent for size and again noted is a calcification near the tip. The appearance of the appendix is unchanged from prior examinations. There is no significant small bowel dilatation or bowel inflammation. There is minimal wall thickening involving  the sigmoid colon but this appears similar to prior examinations. No evidence for acute colonic inflammation.  No acute bone abnormality.  IMPRESSION: No acute abnormality within the abdomen or pelvis.  Small amount of fluid in the pelvis could be physiologic.   Electronically Signed   By: Markus Daft M.D.   On: 10/28/2014 08:03    EKG: Independently reviewed.  Assessment/Plan Active Problems:   Intractable vomiting with nausea       Code Status:   full Family Communication: bedside Disposition Plan: admit   Time spent: 70 mins   [facility] Pager [phone]  If 7PM-7AM, please contact night-coverage [email or web address] Password [facility] 10/29/2014, 10:49 AM

## 2014-10-29 NOTE — Progress Notes (Signed)
UR completed 

## 2014-10-30 LAB — CBC
HCT: 38.8 % (ref 36.0–46.0)
HEMOGLOBIN: 13.4 g/dL (ref 12.0–15.0)
MCH: 26.9 pg (ref 26.0–34.0)
MCHC: 34.5 g/dL (ref 30.0–36.0)
MCV: 77.8 fL — AB (ref 78.0–100.0)
Platelets: 294 10*3/uL (ref 150–400)
RBC: 4.99 MIL/uL (ref 3.87–5.11)
RDW: 15.2 % (ref 11.5–15.5)
WBC: 14.9 10*3/uL — ABNORMAL HIGH (ref 4.0–10.5)

## 2014-10-30 LAB — BASIC METABOLIC PANEL
Anion gap: 12 (ref 5–15)
BUN: 10 mg/dL (ref 6–23)
CALCIUM: 9.1 mg/dL (ref 8.4–10.5)
CO2: 26 meq/L (ref 19–32)
Chloride: 100 mEq/L (ref 96–112)
Creatinine, Ser: 0.95 mg/dL (ref 0.50–1.10)
GFR calc Af Amer: >90 mL/min (ref >90)
GFR, EST NON AFRICAN AMERICAN: 81 mL/min — AB (ref >90)
GLUCOSE: 90 mg/dL (ref 70–99)
Potassium: 3.4 mEq/L — ABNORMAL LOW (ref 3.7–5.3)
SODIUM: 138 meq/L (ref 137–147)

## 2014-10-30 MED ORDER — POTASSIUM CHLORIDE IN NACL 40-0.9 MEQ/L-% IV SOLN
INTRAVENOUS | Status: DC
  Administered 2014-10-30 – 2014-10-31 (×2): 100 mL/h via INTRAVENOUS
  Filled 2014-10-30 (×6): qty 1000

## 2014-10-30 MED ORDER — DOCUSATE SODIUM 100 MG PO CAPS
200.0000 mg | ORAL_CAPSULE | Freq: Two times a day (BID) | ORAL | Status: DC
  Administered 2014-10-30 – 2014-10-31 (×2): 200 mg via ORAL
  Filled 2014-10-30 (×3): qty 2

## 2014-10-30 NOTE — Progress Notes (Signed)
[facility] Progress note  Frances Mccormick BSJ:[id number] DOB: 1985-12-30 DOA: 10/28/2014  Referring physician: Abdominal pain PCP: No PCP Per Patient   Chief Complaint: Intractable nausea and abdominal pain  HPI:  28 year old female with a history of Crohn's disease, followed by Dr. Carol Ada, presented on 11/26 with nausea vomiting and right-sided abdominal pain for several days. The patient had an elevated white count during that presentation of 21,000. Vomiting was described as nonbloody and nonbilious. No diarrhea. The patient has had a diagnosis of Crohn's disease since 2012 diagnosis via colonoscopy. 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 The patient has last been on therapy 1 year ago. She used to be on the lialda.  On 11/26 the patient was prescribed steroids and nausea medicine. This did not help and the patient came back today. She has persistently elevated white count. No bloody diarrhea in fact the patient is constipated with no BM in 2 days. She is passing gas. Found to be hypertensive in the ER   Subjective Patient has not had a BM Tolerating clear liquid diet She is passing gas from below  Assessment and plan Intractable nausea vomiting-improved Differential diagnosis includes Crohn's exacerbation vs viral gastroenteritis Continue IV hydration with potassium Continue on IV Solu-Medrol and Zosyn Patient will be restarted on lialda if symptoms improve, if no improvement GI consultation tomorrow Pain regimen adjusted with morphine and OxyContin UDS positive for marijuana Advance diet as tolerated, patient encouraged to eat more solid food Stool softener Colace added for constipation  Hypokalemia Repleted   Disposition anticipate discharge tomorrow if symptoms improve      Allergies  Allergen Reactions  . Bee Venom Anaphylaxis  . Ciprofloxacin Anaphylaxis    No family history on file.   Prior to Admission medications   Medication Sig Start  Date End Date Taking? Authorizing Provider  acetaminophen (TYLENOL) 500 MG tablet Take 1,000 mg by mouth every 6 (six) hours as needed for moderate pain.   Yes Historical Provider, MD  ibuprofen (ADVIL,MOTRIN) 200 MG tablet Take 400 mg by mouth every 6 (six) hours as needed for moderate pain.   Yes Historical Provider, MD  oxyCODONE-acetaminophen (PERCOCET/ROXICET) 5-325 MG per tablet Take 1 tablet by mouth every 4 (four) hours as needed for severe pain. 10/26/14  Yes Hoy Morn, MD  predniSONE (DELTASONE) 10 MG tablet Take 6 tablets (60 mg total) by mouth daily. 10/26/14  Yes Hoy Morn, MD  promethazine (PHENERGAN) 25 MG tablet Take 1 tablet (25 mg total) by mouth every 6 (six) hours as needed for nausea or vomiting. 10/26/14  Yes Hoy Morn, MD     Physical Exam: Filed Vitals:   10/29/14 0512 10/29/14 1500 10/29/14 2226 10/30/14 0500  BP: 163/100 170/100 109/61 113/61  Pulse: 69 77 75 58  Temp: 98.1 F (36.7 C) 99.5 F (37.5 C) 97.8 F (36.6 C) 98 F (36.7 C)  TempSrc: Oral Oral Oral Oral  Resp: 18 18 20 20   Height:      Weight:      SpO2: 100% 100% 98% 99%     Constitutional: Vital signs reviewed. Patient is a well-developed and well-nourished in no acute distress and cooperative with exam. Alert and oriented x3.  Head: Normocephalic and atraumatic  Ear: TM normal bilaterally  Mouth: no erythema or exudates, MMM  Eyes: PERRL, EOMI, conjunctivae normal, No scleral icterus.  Neck: Supple, Trachea midline normal ROM, No JVD, mass, thyromegaly, or carotid bruit present.  Cardiovascular: RRR, S1  normal, S2 normal, no MRG, pulses symmetric and intact bilaterally  Pulmonary/Chest: CTAB, no wheezes, rales, or rhonchi  Abdominal: Mild right-sided abdominal tenderness without guarding or rebound  GU: no CVA tenderness Musculoskeletal: No joint deformities, erythema, or stiffness, ROM full and no nontender Ext: no edema and no cyanosis, pulses palpable bilaterally (DP and  PT)  Hematology: no cervical, inginal, or axillary adenopathy.  Neurological: A&O x3, Strenght is normal and symmetric bilaterally, cranial nerve II-XII are grossly intact, no focal motor deficit, sensory intact to light touch bilaterally.  Skin: Warm, dry and intact. No rash, cyanosis, or clubbing.  Psychiatric: Normal mood and affect. speech and behavior is normal. Judgment and thought content normal. Cognition and memory are normal.       Labs on Admission:    Basic Metabolic Panel:  Recent Labs Lab 10/26/14 0942 10/28/14 0600 10/28/14 0611 10/29/14 0615 10/30/14 0529  NA 133*  --  137 136* 138  K 3.9  --  3.5* 3.5* 3.4*  CL 95*  --  98 98 100  CO2 20  --  25 23 26   GLUCOSE 103*  --  121* 109* 90  BUN 8  --  15 7 10   CREATININE 0.60  --  0.88 0.74 0.95  CALCIUM 9.5  --  9.1 9.2 9.1  MG  --  2.0  --   --   --    Liver Function Tests:  Recent Labs Lab 10/26/14 0942 10/28/14 0611 10/29/14 0615  AST 31 19 18   ALT 19 14 12   ALKPHOS 93 81 75  BILITOT 0.3 0.2* 0.5  PROT 9.0* 8.6* 8.2  ALBUMIN 4.6 4.2 4.0    Recent Labs Lab 10/28/14 0611  LIPASE 38   No results for input(s): AMMONIA in the last 168 hours. CBC:  Recent Labs Lab 10/26/14 0942 10/28/14 0611 10/29/14 0615 10/30/14 0529  WBC 21.9* 20.6* 17.2* 14.9*  NEUTROABS 19.8* 17.0*  --   --   HGB 15.9* 15.1* 14.5 13.4  HCT 47.0* 44.3 43.4 38.8  MCV 79.9 78.3 77.2* 77.8*  PLT 280 366 326 294   Cardiac Enzymes: No results for input(s): CKTOTAL, CKMB, CKMBINDEX, TROPONINI in the last 168 hours.  BNP (last 3 results) No results for input(s): PROBNP in the last 8760 hours.    CBG: No results for input(s): GLUCAP in the last 168 hours.  Radiological Exams on Admission: No results found.  EKG: Independently reviewed.  Assessment/Plan Active Problems:   Intractable vomiting with nausea       Code Status:   full Family Communication: bedside Disposition Plan: admit   Time spent: 70  mins   [facility] Pager [phone]  If 7PM-7AM, please contact night-coverage [email or web address] Password [email or web address] 10/30/2014, 11:11 AM

## 2014-10-31 MED ORDER — PROMETHAZINE HCL 25 MG PO TABS: 25.0000 mg | ORAL_TABLET | Freq: Four times a day (QID) | ORAL | Status: DC | PRN

## 2014-10-31 MED ORDER — PREDNISONE 10 MG PO TABS: ORAL_TABLET | ORAL | Status: DC

## 2014-10-31 MED ORDER — DSS 100 MG PO CAPS: 200.0000 mg | ORAL_CAPSULE | Freq: Two times a day (BID) | ORAL | Status: DC

## 2014-10-31 NOTE — Progress Notes (Signed)
ANTIBIOTIC CONSULT NOTE - follow-up  Pharmacy Consult for Zosyn Indication: intra-abdominal infection  Allergies  Allergen Reactions  . Bee Venom Anaphylaxis  . Ciprofloxacin Anaphylaxis    Patient Measurements: Height: 5' (152.4 cm) Weight: 161 lb 2.5 oz (73.1 kg) IBW/kg (Calculated) : 45.5  Vital Signs: Temp: 98.6 F (37 C) (12/01 0536) Temp Source: Oral (12/01 0536) BP: 115/Frances mmHg (12/01 0536) Pulse Rate: 61 (12/01 0536) Intake/Output from previous day: 11/30 0701 - 12/01 0700 In: 2001.7 [P.O.:600; I.V.:1251.7; IV Piggyback:150] Out: -   Labs:  Recent Labs  10/29/14 0615 10/30/14 0529  WBC 17.2* 14.9*  HGB 14.5 13.4  PLT 326 294  CREATININE 0.74 0.95   Estimated Creatinine Clearance: 78.6 mL/min (by C-G formula based on Cr of 0.95). No results for input(s): VANCOTROUGH, VANCOPEAK, VANCORANDOM, GENTTROUGH, GENTPEAK, GENTRANDOM, TOBRATROUGH, TOBRAPEAK, TOBRARND, AMIKACINPEAK, AMIKACINTROU, AMIKACIN in the last Frances hours.   Microbiology: No results found for this or any previous visit (from the past 720 hour(s)).  Medical History: Past Medical History  Diagnosis Date  . Crohn's disease   . Non-compliant patient     Assessment: Frances Mccormick presented to [facility] ED on 11/28 with recurrent, worsening right-sided abdominal pain, emesis. PMH significant for Crohn's disease. Pharmacy is consulted to dose Zosyn for suspected intra-abdominal infection.  11/28 >> metronidazole >> 11/28 11/28 >> Zosyn >>  Tmax: afebrile WBCs: improving (on solumedrol) Renal: SCr WNL, CrCl ~ 80 ml/min  Micro: none  Goal of Therapy:  Appropriate abx dosing, eradication of infection.   Plan:   Zosyn 3.375g IV Q8H infused over 4hrs.  No further adjustment needed  Follow up renal function and cultures as available.  Doreene Eland, PharmD, BCPS.   Pager: [phone] 10/31/2014 10:21 AM

## 2014-10-31 NOTE — Progress Notes (Signed)
Patient discharge home alert and oriented discharged instructions given patient verbalize understanding of discharge instructions given, My Chart access declined at this time will access later at home, patient in stable condition at this time

## 2014-10-31 NOTE — Discharge Summary (Addendum)
Physician Discharge Summary  Frances Mccormick MRN: [id number] DOB/AGE: 04-Oct-1986 28 y.o.  PCP: No PCP Per Patient   Admit date: 10/28/2014 Discharge date: 10/31/2014  Discharge Diagnoses:      Intractable vomiting with nausea Due to crohn's exacerbation  Follow-up recommendations Patient to follow-up with PCP in 5-7 days Patient to follow-up with Dr. Carol Ada when her insurance is established in January     Medication List    STOP taking these medications        ibuprofen 200 MG tablet  Commonly known as:  ADVIL,MOTRIN      TAKE these medications        acetaminophen 500 MG tablet  Commonly known as:  TYLENOL  Take 1,000 mg by mouth every 6 (six) hours as needed for moderate pain.     DSS 100 MG Caps  Take 200 mg by mouth 2 (two) times daily.     oxyCODONE-acetaminophen 5-325 MG per tablet  Commonly known as:  PERCOCET/ROXICET  Take 1 tablet by mouth every 4 (four) hours as needed for severe pain.     predniSONE 10 MG tablet  Commonly known as:  DELTASONE  - Prednisone 6 tablets for 5 days  - 5 tablets for 5 days  - 4 tablets for 5 days  - 3 tablets for 5 days  - 2 tablets for 5 days  - 1 tablet for 5 days     promethazine 25 MG tablet  Commonly known as:  PHENERGAN  Take 1 tablet (25 mg total) by mouth every 6 (six) hours as needed for nausea or vomiting.        Discharge Condition: Stable Disposition: 01-Home or Self Care   Consults:   Significant Diagnostic Studies: Ct Abdomen Pelvis W Contrast  10/28/2014   CLINICAL DATA:  28 year old with right-sided pain and history of Crohn's disease. New episodes of emesis.  EXAM: CT ABDOMEN AND PELVIS WITH CONTRAST  TECHNIQUE: Multidetector CT imaging of the abdomen and pelvis was performed using the standard protocol following bolus administration of intravenous contrast.  CONTRAST:  72m OMNIPAQUE IOHEXOL 300 MG/ML SOLN, 1075mOMNIPAQUE IOHEXOL 300 MG/ML SOLN  COMPARISON:  07/08/2014   FINDINGS: Lung bases are clear.  No evidence for free air.  There is heterogeneity in the left hepatic lobe near the falciform ligament. This is a chronic finding for this patient and could be related to focal fat. No acute abnormality within the liver. Portal venous system is patent and the gallbladder appears normal. Normal appearance of the pancreas, spleen, adrenal glands and both kidneys. There is no significant lymphadenopathy in the abdomen or pelvis. Small amount of free fluid in the pelvis could be physiologic. No gross abnormality to the uterus or adnexal structures. Fluid in the urinary bladder.  There is a retrocecal appendix without acute inflammatory changes. Appendix is mildly prominent for size and again noted is a calcification near the tip. The appearance of the appendix is unchanged from prior examinations. There is no significant small bowel dilatation or bowel inflammation. There is minimal wall thickening involving the sigmoid colon but this appears similar to prior examinations. No evidence for acute colonic inflammation.  No acute bone abnormality.  IMPRESSION: No acute abnormality within the abdomen or pelvis.  Small amount of fluid in the pelvis could be physiologic.   Electronically Signed   By: AdMarkus Daft.D.   On: 10/28/2014 08:03       Microbiology: No results found for this or  any previous visit (from the past 240 hour(s)).   Labs: Results for orders placed or performed during the hospital encounter of 10/28/14 (from the past 48 hour(s))  Urine rapid drug screen (hosp performed)     Status: Abnormal   Collection Time: 10/29/14  2:14 PM  Result Value Ref Range   Opiates NONE DETECTED NONE DETECTED   Cocaine NONE DETECTED NONE DETECTED   Benzodiazepines NONE DETECTED NONE DETECTED   Amphetamines NONE DETECTED NONE DETECTED   Tetrahydrocannabinol POSITIVE (A) NONE DETECTED   Barbiturates NONE DETECTED NONE DETECTED    Comment:        DRUG SCREEN FOR MEDICAL  PURPOSES ONLY.  IF CONFIRMATION IS NEEDED FOR ANY PURPOSE, NOTIFY LAB WITHIN 5 DAYS.        LOWEST DETECTABLE LIMITS FOR URINE DRUG SCREEN Drug Class       Cutoff (ng/mL) Amphetamine      1000 Barbiturate      200 Benzodiazepine   286 Tricyclics       381 Opiates          300 Cocaine          300 THC              50   CBC     Status: Abnormal   Collection Time: 10/30/14  5:29 AM  Result Value Ref Range   WBC 14.9 (H) 4.0 - 10.5 K/uL   RBC 4.99 3.87 - 5.11 MIL/uL   Hemoglobin 13.4 12.0 - 15.0 g/dL   HCT 38.8 36.0 - 46.0 %   MCV 77.8 (L) 78.0 - 100.0 fL   MCH 26.9 26.0 - 34.0 pg   MCHC 34.5 30.0 - 36.0 g/dL   RDW 15.2 11.5 - 15.5 %   Platelets 294 150 - 400 K/uL  Basic metabolic panel     Status: Abnormal   Collection Time: 10/30/14  5:29 AM  Result Value Ref Range   Sodium 138 137 - 147 mEq/L   Potassium 3.4 (L) 3.7 - 5.3 mEq/L   Chloride 100 96 - 112 mEq/L   CO2 26 19 - 32 mEq/L   Glucose, Bld 90 70 - 99 mg/dL   BUN 10 6 - 23 mg/dL   Creatinine, Ser 0.95 0.50 - 1.10 mg/dL   Calcium 9.1 8.4 - 10.5 mg/dL   GFR calc non Af Amer 81 (L) >90 mL/min   GFR calc Af Amer >90 >90 mL/min    Comment: (NOTE) The eGFR has been calculated using the CKD EPI equation. This calculation has not been validated in all clinical situations. eGFR's persistently <90 mL/min signify possible Chronic Kidney Disease.    Anion gap 12 5 - 15     HPI :28 year old female with a history of Crohn's disease, followed by Dr. Carol Ada, presented on 11/26 with nausea vomiting and right-sided abdominal pain for several days. The patient had an elevated white count during that presentation of 21,000. Vomiting was described as nonbloody and nonbilious. No diarrhea. The patient has had a diagnosis of Crohn's disease since 2012 diagnosis via colonoscopy. 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 The patient has last been on therapy 1 year ago. She used to be on the lialda.  On 11/26 the patient was  prescribed steroids and nausea medicine. This did not help and the patient came back today. She has persistently elevated white count. No bloody diarrhea in fact the patient is constipated with no BM in 2 days. She is passing  gas. Found to be hypertensive in the ER  HOSPITAL COURSE: Assessment and plan Intractable nausea vomiting-improved Differential diagnosis includes Crohn's exacerbation vs viral gastroenteritis vs secondary to marijuana use Started on IV hydration with potassium Continue on IV Solu-Medrol and Zosyn for suspected Crohn's exacerbation Patient was on lialda a year ago , No GI consultation obtained because of improvement in her symptoms Antibiotic is being discontinued, no evidence of C. Difficile either Patient to continue with outpatient pain regimen Counseled about stopping marijuana use Advance diet as tolerated, patient encouraged to eat more solid food Stool softener Colace added for constipation, constipation resolved Patient to follow-up with Dr. Carol Ada as soon as possible  Hypokalemia Repleted     Discharge Exam: Blood pressure 115/72, pulse 61, temperature 98.6 F (37 C), temperature source Oral, resp. rate 18, height 5' (1.524 m), weight 73.1 kg (161 lb 2.5 oz), last menstrual period 10/21/2014, SpO2 100 %.  Constitutional: Vital signs reviewed. Patient is a well-developed and well-nourished in no acute distress and cooperative with exam. Alert and oriented x3.  Head: Normocephalic and atraumatic  Ear: TM normal bilaterally  Mouth: no erythema or exudates, MMM  Eyes: PERRL, EOMI, conjunctivae normal, No scleral icterus.  Neck: Supple, Trachea midline normal ROM, No JVD, mass, thyromegaly, or carotid bruit present.  Cardiovascular: RRR, S1 normal, S2 normal, no MRG, pulses symmetric and intact bilaterally  Pulmonary/Chest: CTAB, no wheezes, rales, or rhonchi  Abdominal: Mild right-sided abdominal tenderness without guarding or rebound  GU:  no CVA tenderness Musculoskeletal: No joint deformities, erythema, or stiffness, ROM full and no nontender Ext: no edema and no cyanosis, pulses palpable bilaterally (DP and PT)  Hematology: no cervical, inginal, or axillary adenopathy.  Neurological: A&O x3, Strenght is normal and symmetric bilaterally, cranial nerve II-XII are grossly intact, no focal motor deficit, sensory intact to light touch bilaterally.  Skin: Warm, dry and intact. No rash, cyanosis, or clubbing.  Psychiatric: Normal mood and affect. speech and behavior is normal. Judgment and thought content normal. Cognition and memory are normal.        Discharge Instructions    Diet - low sodium heart healthy    Complete by:  As directed      Increase activity slowly    Complete by:  As directed              Signed: Kennethia Lynes 10/31/2014, 12:40 PM

## 2014-10-31 NOTE — Discharge Instructions (Addendum)
Patient was admitted 11/28  Patient can go back to work on 12/3

## 2015-01-06 ENCOUNTER — Inpatient Hospital Stay (HOSPITAL_COMMUNITY)
Admission: EM | Admit: 2015-01-06 | Discharge: 2015-01-08 | DRG: 392 | Disposition: A | Discharge status: Home / Self Care | Payer: No Typology Code available for payment source | Attending: Internal Medicine | Admitting: Internal Medicine

## 2015-01-06 ENCOUNTER — Emergency Department (HOSPITAL_COMMUNITY): Payer: No Typology Code available for payment source

## 2015-01-06 ENCOUNTER — Encounter (HOSPITAL_COMMUNITY): Payer: Self-pay | Admitting: Emergency Medicine

## 2015-01-06 ENCOUNTER — Inpatient Hospital Stay (HOSPITAL_COMMUNITY): Payer: No Typology Code available for payment source

## 2015-01-06 DIAGNOSIS — R1011 Right upper quadrant pain: Secondary | ICD-10-CM

## 2015-01-06 DIAGNOSIS — D72829 Elevated white blood cell count, unspecified: Secondary | ICD-10-CM | POA: Diagnosis present

## 2015-01-06 DIAGNOSIS — Z833 Family history of diabetes mellitus: Secondary | ICD-10-CM | POA: Diagnosis not present

## 2015-01-06 DIAGNOSIS — R1110 Vomiting, unspecified: Secondary | ICD-10-CM

## 2015-01-06 DIAGNOSIS — Z9119 Patient's noncompliance with other medical treatment and regimen: Secondary | ICD-10-CM | POA: Diagnosis present

## 2015-01-06 DIAGNOSIS — Z79899 Other long term (current) drug therapy: Secondary | ICD-10-CM | POA: Diagnosis not present

## 2015-01-06 DIAGNOSIS — K5090 Crohn's disease, unspecified, without complications: Secondary | ICD-10-CM | POA: Diagnosis present

## 2015-01-06 DIAGNOSIS — E876 Hypokalemia: Secondary | ICD-10-CM | POA: Diagnosis present

## 2015-01-06 DIAGNOSIS — R112 Nausea with vomiting, unspecified: Secondary | ICD-10-CM | POA: Diagnosis not present

## 2015-01-06 DIAGNOSIS — Z7952 Long term (current) use of systemic steroids: Secondary | ICD-10-CM | POA: Diagnosis not present

## 2015-01-06 DIAGNOSIS — A09 Infectious gastroenteritis and colitis, unspecified: Principal | ICD-10-CM | POA: Diagnosis present

## 2015-01-06 DIAGNOSIS — R109 Unspecified abdominal pain: Secondary | ICD-10-CM

## 2015-01-06 DIAGNOSIS — K50919 Crohn's disease, unspecified, with unspecified complications: Secondary | ICD-10-CM

## 2015-01-06 LAB — LIPASE, BLOOD: Lipase: 22 U/L (ref 11–59)

## 2015-01-06 LAB — CBC WITH DIFFERENTIAL/PLATELET
BASOS PCT: 0 % (ref 0–1)
Basophils Absolute: 0 10*3/uL (ref 0.0–0.1)
EOS PCT: 1 % (ref 0–5)
Eosinophils Absolute: 0.1 10*3/uL (ref 0.0–0.7)
HEMATOCRIT: 44.3 % (ref 36.0–46.0)
Hemoglobin: 14.5 g/dL (ref 12.0–15.0)
LYMPHS PCT: 10 % — AB (ref 12–46)
Lymphs Abs: 2.2 10*3/uL (ref 0.7–4.0)
MCH: 26.4 pg (ref 26.0–34.0)
MCHC: 32.7 g/dL (ref 30.0–36.0)
MCV: 80.5 fL (ref 78.0–100.0)
Monocytes Absolute: 0.7 10*3/uL (ref 0.1–1.0)
Monocytes Relative: 4 % (ref 3–12)
NEUTROS PCT: 85 % — AB (ref 43–77)
Neutro Abs: 17.8 10*3/uL — ABNORMAL HIGH (ref 1.7–7.7)
PLATELETS: 310 10*3/uL (ref 150–400)
RBC: 5.5 MIL/uL — AB (ref 3.87–5.11)
RDW: 15.1 % (ref 11.5–15.5)
WBC: 20.9 10*3/uL — AB (ref 4.0–10.5)

## 2015-01-06 LAB — COMPREHENSIVE METABOLIC PANEL
ALT: 55 U/L — AB (ref 0–35)
ANION GAP: 9 (ref 5–15)
AST: 47 U/L — ABNORMAL HIGH (ref 0–37)
Albumin: 4.5 g/dL (ref 3.5–5.2)
Alkaline Phosphatase: 75 U/L (ref 39–117)
BUN: 10 mg/dL (ref 6–23)
CO2: 24 mmol/L (ref 19–32)
Calcium: 8.8 mg/dL (ref 8.4–10.5)
Chloride: 103 mmol/L (ref 96–112)
Creatinine, Ser: 0.82 mg/dL (ref 0.50–1.10)
GFR calc non Af Amer: >90 mL/min (ref >90)
Glucose, Bld: 156 mg/dL — ABNORMAL HIGH (ref 70–99)
Potassium: 3.7 mmol/L (ref 3.5–5.1)
Sodium: 136 mmol/L (ref 135–145)
Total Bilirubin: 0.7 mg/dL (ref 0.3–1.2)
Total Protein: 8.3 g/dL (ref 6.0–8.3)

## 2015-01-06 LAB — URINALYSIS, ROUTINE W REFLEX MICROSCOPIC
Bilirubin Urine: NEGATIVE
Glucose, UA: 100 mg/dL — AB
Hgb urine dipstick: NEGATIVE
Ketones, ur: NEGATIVE mg/dL
LEUKOCYTES UA: NEGATIVE
NITRITE: NEGATIVE
PH: 7.5 (ref 5.0–8.0)
Protein, ur: NEGATIVE mg/dL
Specific Gravity, Urine: 1.011 (ref 1.005–1.030)
UROBILINOGEN UA: 0.2 mg/dL (ref 0.0–1.0)

## 2015-01-06 LAB — POC URINE PREG, ED: Preg Test, Ur: NEGATIVE

## 2015-01-06 LAB — I-STAT CG4 LACTIC ACID, ED: Lactic Acid, Venous: 1.9 mmol/L (ref 0.5–2.0)

## 2015-01-06 MED ORDER — MORPHINE SULFATE 2 MG/ML IJ SOLN
1.0000 mg | INTRAMUSCULAR | Status: DC | PRN
  Administered 2015-01-06 – 2015-01-08 (×7): 1 mg via INTRAVENOUS
  Filled 2015-01-06 (×7): qty 1

## 2015-01-06 MED ORDER — SODIUM CHLORIDE 0.9 % IV BOLUS (SEPSIS): 1000.0000 mL | Freq: Once | INTRAVENOUS | Status: DC

## 2015-01-06 MED ORDER — SODIUM CHLORIDE 0.9 % IV SOLN
INTRAVENOUS | Status: AC
  Administered 2015-01-06: 13:00:00 via INTRAVENOUS

## 2015-01-06 MED ORDER — MORPHINE SULFATE 4 MG/ML IJ SOLN
4.0000 mg | Freq: Once | INTRAMUSCULAR | Status: AC
  Administered 2015-01-06: 4 mg via INTRAVENOUS
  Filled 2015-01-06: qty 1

## 2015-01-06 MED ORDER — ONDANSETRON HCL 4 MG PO TABS: 4.0000 mg | ORAL_TABLET | Freq: Four times a day (QID) | ORAL | Status: DC | PRN

## 2015-01-06 MED ORDER — METOCLOPRAMIDE HCL 5 MG/ML IJ SOLN
10.0000 mg | Freq: Once | INTRAMUSCULAR | Status: AC
  Administered 2015-01-06: 10 mg via INTRAVENOUS
  Filled 2015-01-06: qty 2

## 2015-01-06 MED ORDER — MORPHINE SULFATE 4 MG/ML IJ SOLN
4.0000 mg | Freq: Once | INTRAMUSCULAR | Status: DC
Start: 2015-01-06 — End: 2015-01-06
  Filled 2015-01-06: qty 1

## 2015-01-06 MED ORDER — METOCLOPRAMIDE HCL 5 MG/ML IJ SOLN
10.0000 mg | Freq: Once | INTRAMUSCULAR | Status: DC
  Filled 2015-01-06: qty 2

## 2015-01-06 MED ORDER — PREDNISONE 50 MG PO TABS
50.0000 mg | ORAL_TABLET | Freq: Every day | ORAL | Status: DC
  Administered 2015-01-06 – 2015-01-07 (×2): 50 mg via ORAL
  Filled 2015-01-06 (×5): qty 1

## 2015-01-06 MED ORDER — HEPARIN SODIUM (PORCINE) 5000 UNIT/ML IJ SOLN
5000.0000 [IU] | Freq: Three times a day (TID) | INTRAMUSCULAR | Status: DC
  Administered 2015-01-06 – 2015-01-08 (×6): 5000 [IU] via SUBCUTANEOUS
  Filled 2015-01-06 (×8): qty 1

## 2015-01-06 MED ORDER — SODIUM CHLORIDE 0.9 % IV BOLUS (SEPSIS)
1000.0000 mL | Freq: Once | INTRAVENOUS | Status: AC
  Administered 2015-01-06: 1000 mL via INTRAVENOUS

## 2015-01-06 MED ORDER — ONDANSETRON HCL 4 MG/2ML IJ SOLN
4.0000 mg | Freq: Once | INTRAMUSCULAR | Status: AC
  Administered 2015-01-06: 4 mg via INTRAVENOUS
  Filled 2015-01-06: qty 2

## 2015-01-06 MED ORDER — MESALAMINE ER 250 MG PO CPCR
1000.0000 mg | ORAL_CAPSULE | Freq: Four times a day (QID) | ORAL | Status: DC
  Administered 2015-01-06 – 2015-01-07 (×5): 1000 mg via ORAL
  Filled 2015-01-06 (×11): qty 4

## 2015-01-06 MED ORDER — KCL IN DEXTROSE-NACL 20-5-0.45 MEQ/L-%-% IV SOLN
INTRAVENOUS | Status: DC
  Administered 2015-01-06 – 2015-01-07 (×2): via INTRAVENOUS
  Filled 2015-01-06 (×3): qty 1000

## 2015-01-06 MED ORDER — ONDANSETRON HCL 4 MG/2ML IJ SOLN
4.0000 mg | Freq: Four times a day (QID) | INTRAMUSCULAR | Status: DC | PRN
  Administered 2015-01-06 – 2015-01-07 (×3): 4 mg via INTRAVENOUS
  Filled 2015-01-06 (×3): qty 2

## 2015-01-06 MED ORDER — IOHEXOL 300 MG/ML  SOLN
100.0000 mL | Freq: Once | INTRAMUSCULAR | Status: AC | PRN
  Administered 2015-01-06: 100 mL via INTRAVENOUS

## 2015-01-06 MED ORDER — METOCLOPRAMIDE HCL 5 MG/ML IJ SOLN: 10.0000 mg | Freq: Once | INTRAMUSCULAR | Status: DC

## 2015-01-06 MED ORDER — MORPHINE SULFATE 4 MG/ML IJ SOLN
4.0000 mg | Freq: Once | INTRAMUSCULAR | Status: AC
  Administered 2015-01-06: 4 mg via INTRAMUSCULAR

## 2015-01-06 MED ORDER — ONDANSETRON 8 MG PO TBDP
8.0000 mg | ORAL_TABLET | Freq: Once | ORAL | Status: AC
  Administered 2015-01-06: 8 mg via ORAL
  Filled 2015-01-06: qty 1

## 2015-01-06 MED ORDER — OXYCODONE HCL 5 MG PO TABS
5.0000 mg | ORAL_TABLET | ORAL | Status: DC | PRN
  Administered 2015-01-07 – 2015-01-08 (×6): 5 mg via ORAL
  Filled 2015-01-06 (×6): qty 1

## 2015-01-06 MED ORDER — IOHEXOL 300 MG/ML  SOLN
50.0000 mL | Freq: Once | INTRAMUSCULAR | Status: AC | PRN
  Administered 2015-01-06: 50 mL via ORAL

## 2015-01-06 MED ORDER — METOCLOPRAMIDE HCL 5 MG/ML IJ SOLN
10.0000 mg | Freq: Once | INTRAMUSCULAR | Status: AC
  Administered 2015-01-06: 10 mg via INTRAMUSCULAR

## 2015-01-06 NOTE — ED Provider Notes (Signed)
CSN: [id number]     Arrival date & time 01/06/15  7741 History   First MD Initiated Contact with Patient 01/06/15 [phone]     Chief Complaint  Patient presents with  . Emesis     (Consider location/radiation/quality/duration/timing/severity/associated sxs/prior Treatment) HPI    PCP: No PCP Per Patient Blood pressure 92/64, pulse 72, temperature 98.4 F (36.9 C), temperature source Oral, resp. rate 18, last menstrual period 12/13/2014, SpO2 100 %.  Frances Mccormick is a 29 y.o.female with a significant PMH of crohn's disease presents to the ER with complaints of abdominal pain and vomiting. She woke up acutely at 3 am this morning with vomiting and diffuse pain. She denies being able to localize the pain at this time. It feels like her previous crohn flairs. She has not tried any medications at home for treatment. She has not had any fevers, dysuria, vaginal discharge, vaginal bleeding, headache, body aches.  Pt actively vomiting during HPI.    Past Medical History  Diagnosis Date  . Crohn's disease   . Non-compliant patient    Past Surgical History  Procedure Laterality Date  . Induced abortion     No family history on file. History  Substance Use Topics  . Smoking status: Never Smoker   . Smokeless tobacco: Not on file  . Alcohol Use: Yes     Comment: ocassionally   OB History    No data available     Review of Systems 10 Systems reviewed and are negative for acute change except as noted in the HPI.    Allergies  Bee venom and Ciprofloxacin  Home Medications   Prior to Admission medications   Medication Sig Start Date End Date Taking? Authorizing Provider  ibuprofen (ADVIL,MOTRIN) 200 MG tablet Take 400 mg by mouth every 6 (six) hours as needed for mild pain.   Yes Historical Provider, MD  Phenyleph-CPM-DM-APAP (TYLENOL COLD MULTI-SYMPTOM PO) Take 10 mLs by mouth every 6 (six) hours as needed (cough).   Yes Historical Provider, MD  docusate sodium 100 MG CAPS Take  200 mg by mouth 2 (two) times daily. Patient not taking: Reported on 01/06/2015 10/31/14   Reyne Dumas, MD  oxyCODONE-acetaminophen (PERCOCET/ROXICET) 5-325 MG per tablet Take 1 tablet by mouth every 4 (four) hours as needed for severe pain. Patient not taking: Reported on 01/06/2015 10/26/14   Hoy Morn, MD  predniSONE (DELTASONE) 10 MG tablet Prednisone 6 tablets for 5 days 5 tablets for 5 days 4 tablets for 5 days 3 tablets for 5 days 2 tablets for 5 days 1 tablet for 5 days Patient not taking: Reported on 01/06/2015 10/31/14   Reyne Dumas, MD  promethazine (PHENERGAN) 25 MG tablet Take 1 tablet (25 mg total) by mouth every 6 (six) hours as needed for nausea or vomiting. Patient not taking: Reported on 01/06/2015 10/31/14   Reyne Dumas, MD   BP 142/85 mmHg  Pulse 64  Temp(Src) 98.4 F (36.9 C) (Oral)  Resp 18  SpO2 100%  LMP 12/13/2014 (Approximate) Physical Exam  Constitutional: She appears well-developed and well-nourished. She appears distressed.  HENT:  Head: Normocephalic and atraumatic.  Eyes: Pupils are equal, round, and reactive to light.  Neck: Normal range of motion. Neck supple.  Cardiovascular: Normal rate and regular rhythm.   Pulmonary/Chest: Effort normal and breath sounds normal.  Abdominal: Soft. Bowel sounds are normal. There is tenderness (diffuse and moderate tenderness, it does not localize). There is no rigidity, no rebound and no guarding.  On re-evaluation after pain control the patients pain localizes to the epigastrium and RUQ.  Neurological: She is alert.  Skin: Skin is warm and dry.  Nursing note and vitals reviewed.   ED Course  Procedures (including critical care time) Labs Review Labs Reviewed  CBC WITH DIFFERENTIAL/PLATELET - Abnormal; Notable for the following:    WBC 20.9 (*)    RBC 5.50 (*)    Neutrophils Relative % 85 (*)    Neutro Abs 17.8 (*)    Lymphocytes Relative 10 (*)    All other components within normal limits  COMPREHENSIVE  METABOLIC PANEL - Abnormal; Notable for the following:    Glucose, Bld 156 (*)    AST 47 (*)    ALT 55 (*)    All other components within normal limits  LIPASE, BLOOD  URINALYSIS, ROUTINE W REFLEX MICROSCOPIC  POC URINE PREG, ED  I-STAT CG4 LACTIC ACID, ED    Imaging Review No results found.   EKG Interpretation None      MDM   Final diagnoses:  RUQ abdominal pain    IV started by beside [place] by myself. --  Medications  sodium chloride 0.9 % bolus 1,000 mL (1,000 mLs Intravenous New Bag/Given 01/06/15 0854)  ondansetron (ZOFRAN-ODT) disintegrating tablet 8 mg (8 mg Oral Given 01/06/15 0657)  sodium chloride 0.9 % bolus 1,000 mL (1,000 mLs Intravenous New Bag/Given 01/06/15 0809)  metoCLOPramide (REGLAN) injection 10 mg (10 mg Intramuscular Given 01/06/15 0748)  morphine 4 MG/ML injection 4 mg (4 mg Intramuscular Given 01/06/15 0748)  metoCLOPramide (REGLAN) injection 10 mg (10 mg Intravenous Given 01/06/15 0854)    9: 30 am Blood pressure has improved significantly with fluids, 142/85. She continues to be afebrile, no tachycardia. No longer diaphoretic and her pain is mild but localizes RUQ/epigastrum- describes it as a sore feeling. Her labs show a neg lactic acid, neg urine preg, neg lipase.  Her sig positive labs are WBC of 20.9 and elevated AST/ALT at 47/55. Will obtain US of the abdomen for further evaluation and cont to monitor pt.  12:00 pm- pt did poorly with PO challenge. She took two sips and developed worsened abdominal pain and nausea. Pt still feels puny.   Spokew ith Dr. Wendee Beavers who has agreed to admit, inpatient, [facility], admits, med-surg.  @ 12:09 pm I spoke with Dr.Mann who has agreed to consult on the patient.  Filed Vitals:   01/06/15 0857  BP: 142/85  Pulse: 64  Temp:   Resp: [address], PA-C 01/06/15 [place], MD 01/09/15 1029

## 2015-01-06 NOTE — ED Notes (Signed)
Pt has been stuck multiple times by RN, charge and phlebotmy for IV access and blood. PT sts she is a hard stick.  PA is going to attempt [place] IV for access.

## 2015-01-06 NOTE — H&P (Signed)
[facility] History and Physical  Frances Mccormick:[id number] DOB: 10/25/1986 DOA: 01/06/2015  Referring physician: Dr. Kathrynn Humble PCP: No PCP Per Patient   Chief Complaint: nausea and emesis  HPI: Frances Mccormick is a 29 y.o. female  Patient has history of Crohn's disease and presents after developing nausea and emesis at 3 AM this morning. Nothing she is aware of makes it better or worse. She has not been on her Crohn's medication and currently is having discomfort related and similar to Crohn flareups. Given persistent nausea and emesis patient decided to present to the ED for further evaluation recommendations.  We were consulted for further medical evaluation and recommendations. GI consulted for further assistance with case   Review of Systems:  Constitutional:  No weight loss, night sweats, Fevers, chills, fatigue.  HEENT:  No headaches, Difficulty swallowing,Tooth/dental problems,Sore throat,  No sneezing, itching, ear ache, nasal congestion, post nasal drip,  Cardio-vascular:  No chest pain, Orthopnea, PND, swelling in lower extremities, anasarca, dizziness, palpitations  GI:  No heartburn, indigestion, + abdominal pain, + nausea, + vomiting, diarrhea, change in bowel habits, + loss of appetite  Resp:  No shortness of breath with exertion or at rest. No excess mucus, no productive cough, No non-productive cough, No coughing up of blood.No change in color of mucus.No wheezing.No chest wall deformity  Skin:  no rash or lesions.  GU:  no dysuria, change in color of urine, no urgency or frequency. No flank pain.  Musculoskeletal:  No joint pain or swelling. No decreased range of motion. No back pain.  Psych:  No change in mood or affect. No depression or anxiety. No memory loss.   Past Medical History  Diagnosis Date  . Crohn's disease   . Non-compliant patient    Past Surgical History  Procedure Laterality Date  . Induced abortion     Social History:   reports that she has never smoked. She does not have any smokeless tobacco history on file. She reports that she drinks alcohol. She reports that she uses illicit drugs (Marijuana).  Allergies  Allergen Reactions  . Bee Venom Anaphylaxis  . Ciprofloxacin Anaphylaxis   Family history - reports diabetes runs in family  Prior to Admission medications   Medication Sig Start Date End Date Taking? Authorizing Provider  ibuprofen (ADVIL,MOTRIN) 200 MG tablet Take 400 mg by mouth every 6 (six) hours as needed for mild pain.   Yes Historical Provider, MD  Phenyleph-CPM-DM-APAP (TYLENOL COLD MULTI-SYMPTOM PO) Take 10 mLs by mouth every 6 (six) hours as needed (cough).   Yes Historical Provider, MD  docusate sodium 100 MG CAPS Take 200 mg by mouth 2 (two) times daily. Patient not taking: Reported on 01/06/2015 10/31/14   Reyne Dumas, MD  oxyCODONE-acetaminophen (PERCOCET/ROXICET) 5-325 MG per tablet Take 1 tablet by mouth every 4 (four) hours as needed for severe pain. Patient not taking: Reported on 01/06/2015 10/26/14   Hoy Morn, MD  predniSONE (DELTASONE) 10 MG tablet Prednisone 6 tablets for 5 days 5 tablets for 5 days 4 tablets for 5 days 3 tablets for 5 days 2 tablets for 5 days 1 tablet for 5 days Patient not taking: Reported on 01/06/2015 10/31/14   Reyne Dumas, MD  promethazine (PHENERGAN) 25 MG tablet Take 1 tablet (25 mg total) by mouth every 6 (six) hours as needed for nausea or vomiting. Patient not taking: Reported on 01/06/2015 10/31/14   Reyne Dumas, MD   Physical Exam: Filed Vitals:   01/06/15  0654 01/06/15 0857 01/06/15 1301 01/06/15 1331  BP: 92/64 142/85 103/62 111/63  Pulse: 72 64 92 71  Temp:    98 F (36.7 C)  TempSrc:    Oral  Resp:  18 20 18   SpO2: 100% 100% 100% 90%    Wt Readings from Last 3 Encounters:  10/28/14 73.1 kg (161 lb 2.5 oz)  02/08/14 72.576 kg (160 lb)  11/19/12 73.12 kg (161 lb 3.2 oz)    General:  Appears calm and comfortable Eyes: PERRL,  normal lids, irises & conjunctiva ENT: grossly normal hearing, lips & tongue Neck: no LAD, masses or thyromegaly Cardiovascular: RRR, no m/r/g. No LE edema. Telemetry: SR, no arrhythmias  Respiratory: CTA bilaterally, no w/r/r. Normal respiratory effort. Abdomen: soft, no guarding, no rebound tenderness, generalized discomfort with deep palpation Skin: no rash or induration seen on limited exam Musculoskeletal: grossly normal tone BUE/BLE Psychiatric: grossly normal mood and affect, speech fluent and appropriate Neurologic: grossly non-focal.          Labs on Admission:  Basic Metabolic Panel:  Recent Labs Lab 01/06/15 0808  NA 136  K 3.7  CL 103  CO2 24  GLUCOSE 156*  BUN 10  CREATININE 0.82  CALCIUM 8.8   Liver Function Tests:  Recent Labs Lab 01/06/15 0808  AST 47*  ALT 55*  ALKPHOS 75  BILITOT 0.7  PROT 8.3  ALBUMIN 4.5    Recent Labs Lab 01/06/15 0808  LIPASE 22   No results for input(s): AMMONIA in the last 168 hours. CBC:  Recent Labs Lab 01/06/15 0808  WBC 20.9*  NEUTROABS 17.8*  HGB 14.5  HCT 44.3  MCV 80.5  PLT 310   Cardiac Enzymes: No results for input(s): CKTOTAL, CKMB, CKMBINDEX, TROPONINI in the last 168 hours.  BNP (last 3 results) No results for input(s): BNP in the last 8760 hours.  ProBNP (last 3 results) No results for input(s): PROBNP in the last 8760 hours.  CBG: No results for input(s): GLUCAP in the last 168 hours.  Radiological Exams on Admission: US Abdomen Complete  01/06/2015   CLINICAL DATA:  Initial evaluation for right upper quadrant abdominal pain, nausea, and vomiting for approximately 8 hr, history of Crohn's disease  EXAM: ULTRASOUND ABDOMEN COMPLETE  COMPARISON:  10/28/2014  FINDINGS: Gallbladder: No gallstones or wall thickening visualized. No sonographic Murphy sign noted.  Common bile duct: Diameter: 5 mm  Liver: No focal lesion identified. Within normal limits in parenchymal echogenicity.  IVC: No  abnormality visualized.  Pancreas: Visualized portion unremarkable.  Spleen: Size and appearance within normal limits.  Right Kidney: Length: 10 cm. Normal echogenicity. Mild fullness of the collecting system within the kidney. Bilateral ureteral jets are identified.  Left Kidney: Length: 10 cm. Echogenicity within normal limits. No mass or hydronephrosis visualized.  Abdominal aorta: No aneurysm visualized.  Other findings: None.  IMPRESSION: Mild prominence of the intrarenal collecting system on the right, of questionable significance. Bilateral ureteral jets are identified indicating absence of complete obstruction. Possibility of a recently passed stone or nonvisualized nonobstructing right ureteral stone not excluded.   Electronically Signed   By: Skipper Cliche M.D.   On: 01/06/2015 11:10     Assessment/Plan Active Problems:   Exacerbation of Crohn's disease - Consult GI for further evaluation and follow-up recommendations - Mesalamine and prednisone - Supportive therapy - Clear liquid diet    Code Status: Full DVT Prophylaxis: Heparin Family Communication: Discussed with patient and mother at bedside Disposition Plan: med surg  Time spent: > 45  Velvet Bathe [facility] Pager [phone]

## 2015-01-06 NOTE — ED Notes (Signed)
PA Tiffany at bedside.  

## 2015-01-06 NOTE — Progress Notes (Signed)
Pt with vomiting after ondansetron given for nausea. Not time for another dose of med. MD made aware. Gave order for one-time dose of ondansetron. Vwilliams,rn.

## 2015-01-06 NOTE — ED Notes (Signed)
Pt woke up with vomiting. Denies any diarrhea.

## 2015-01-06 NOTE — ED Notes (Signed)
Pt ambulated to restroom with steady gait.

## 2015-01-06 NOTE — ED Notes (Signed)
Patient given ODT Zofran during triage process Patient states that the Zofran is making her vomit Will make EDP aware

## 2015-01-06 NOTE — ED Notes (Signed)
Multiple phlebotomy attempts, [place] IV will be attempted, with blood collection at that time

## 2015-01-06 NOTE — Consult Note (Signed)
Cross cover Dr. Carol Mccormick.. Reason for Consult: Abdominal pain, nausea and vomiting.  Referring Physician: ER-MD  Frances Mccormick is an 29 y.o. female.  HPI: 29 year ols black female diagnosed with ?small bowel Crohn's in 2012 when a colonoscopy revealed erosions in the T.I. with some patchy colitis in the colon-biopsies revealed ileitis and colitis with NO granulomas. Patient lost her insurance for a few years and was not compliant with her medications. Woke up this morning with severe RLq pain, nausea and vomiting. No history of associated fever, chills or rigors. Some loose stools 2-3 per day with no history of melena or hematochezia. She denies having any ocular symptoms, joint pains or skin rashes. She takes NSAIDS frequently for headaches.  Past Medical History  Diagnosis Date  . Crohn's disease   . Non-compliant patient    Past Surgical History  Procedure Laterality Date  . Induced abortion     No family history on file.  Social History:  reports that she has never smoked. She does not have any smokeless tobacco history on file. She reports that she drinks alcohol. She reports that she uses illicit drugs (Marijuana).  Allergies:  Allergies  Allergen Reactions  . Bee Venom Anaphylaxis  . Ciprofloxacin Anaphylaxis   Medications: I have reviewed the patient's current medications.  Results for orders placed or performed during the hospital encounter of 01/06/15 (from the past 48 hour(s))  CBC with Differential     Status: Abnormal   Collection Time: 01/06/15  8:08 AM  Result Value Ref Range   WBC 20.9 (H) 4.0 - 10.5 K/uL   RBC 5.50 (H) 3.87 - 5.11 MIL/uL   Hemoglobin 14.5 12.0 - 15.0 g/dL   HCT 44.3 36.0 - 46.0 %   MCV 80.5 78.0 - 100.0 fL   MCH 26.4 26.0 - 34.0 pg   MCHC 32.7 30.0 - 36.0 g/dL   RDW 15.1 11.5 - 15.5 %   Platelets 310 150 - 400 K/uL   Neutrophils Relative % 85 (H) 43 - 77 %   Neutro Abs 17.8 (H) 1.7 - 7.7 K/uL   Lymphocytes Relative 10 (L) 12 - 46 %    Lymphs Abs 2.2 0.7 - 4.0 K/uL   Monocytes Relative 4 3 - 12 %   Monocytes Absolute 0.7 0.1 - 1.0 K/uL   Eosinophils Relative 1 0 - 5 %   Eosinophils Absolute 0.1 0.0 - 0.7 K/uL   Basophils Relative 0 0 - 1 %   Basophils Absolute 0.0 0.0 - 0.1 K/uL  Comprehensive metabolic panel     Status: Abnormal   Collection Time: 01/06/15  8:08 AM  Result Value Ref Range   Sodium 136 135 - 145 mmol/L   Potassium 3.7 3.5 - 5.1 mmol/L   Chloride 103 96 - 112 mmol/L   CO2 24 19 - 32 mmol/L   Glucose, Bld 156 (H) 70 - 99 mg/dL   BUN 10 6 - 23 mg/dL   Creatinine, Ser 0.82 0.50 - 1.10 mg/dL   Calcium 8.8 8.4 - 10.5 mg/dL   Total Protein 8.3 6.0 - 8.3 g/dL   Albumin 4.5 3.5 - 5.2 g/dL   AST 47 (H) 0 - 37 U/L   ALT 55 (H) 0 - 35 U/L   Alkaline Phosphatase 75 39 - 117 U/L   Total Bilirubin 0.7 0.3 - 1.2 mg/dL   GFR calc non Af Amer >90 >90 mL/min   GFR calc Af Amer >90 >90 mL/min  Comment: (NOTE) The eGFR has been calculated using the CKD EPI equation. This calculation has not been validated in all clinical situations. eGFR's persistently <90 mL/min signify possible Chronic Kidney Disease.    Anion gap 9 5 - 15  Lipase, blood     Status: None   Collection Time: 01/06/15  8:08 AM  Result Value Ref Range   Lipase 22 11 - 59 U/L  Urinalysis, Routine w reflex microscopic     Status: Abnormal   Collection Time: 01/06/15  8:45 AM  Result Value Ref Range   Color, Urine YELLOW YELLOW   APPearance CLEAR CLEAR   Specific Gravity, Urine 1.011 1.005 - 1.030   pH 7.5 5.0 - 8.0   Glucose, UA 100 (A) NEGATIVE mg/dL   Hgb urine dipstick NEGATIVE NEGATIVE   Bilirubin Urine NEGATIVE NEGATIVE   Ketones, ur NEGATIVE NEGATIVE mg/dL   Protein, ur NEGATIVE NEGATIVE mg/dL   Urobilinogen, UA 0.2 0.0 - 1.0 mg/dL   Nitrite NEGATIVE NEGATIVE   Leukocytes, UA NEGATIVE NEGATIVE    Comment: MICROSCOPIC NOT DONE ON URINES WITH NEGATIVE PROTEIN, BLOOD, LEUKOCYTES, NITRITE, OR GLUCOSE <1000 mg/dL.  POC Urine  Pregnancy, ED  (If Pre-menopausal female) - do not order at [facility]     Status: None   Collection Time: 01/06/15  8:50 AM  Result Value Ref Range   Preg Test, Ur NEGATIVE NEGATIVE    Comment:        THE SENSITIVITY OF THIS METHODOLOGY IS >24 mIU/mL   I-Stat CG4 Lactic Acid, ED     Status: None   Collection Time: 01/06/15  9:01 AM  Result Value Ref Range   Lactic Acid, Venous 1.90 0.5 - 2.0 mmol/L   US Abdomen Complete  01/06/2015   CLINICAL DATA:  Initial evaluation for right upper quadrant abdominal pain, nausea, and vomiting for approximately 8 hr, history of Crohn's disease  EXAM: ULTRASOUND ABDOMEN COMPLETE  COMPARISON:  10/28/2014  FINDINGS: Gallbladder: No gallstones or wall thickening visualized. No sonographic Murphy sign noted.  Common bile duct: Diameter: 5 mm  Liver: No focal lesion identified. Within normal limits in parenchymal echogenicity.  IVC: No abnormality visualized.  Pancreas: Visualized portion unremarkable.  Spleen: Size and appearance within normal limits.  Right Kidney: Length: 10 cm. Normal echogenicity. Mild fullness of the collecting system within the kidney. Bilateral ureteral jets are identified.  Left Kidney: Length: 10 cm. Echogenicity within normal limits. No mass or hydronephrosis visualized.  Abdominal aorta: No aneurysm visualized.  Other findings: None.  IMPRESSION: Mild prominence of the intrarenal collecting system on the right, of questionable significance. Bilateral ureteral jets are identified indicating absence of complete obstruction. Possibility of a recently passed stone or nonvisualized nonobstructing right ureteral stone not excluded.   Electronically Signed   By: Esperanza Heir M.D.   On: 01/06/2015 11:10   Review of Systems  Constitutional: Positive for malaise/fatigue. Negative for fever, chills and weight loss.  HENT: Negative.   Eyes: Negative.   Respiratory: Negative.   Cardiovascular: Negative.   Gastrointestinal: Positive for nausea, vomiting  and abdominal pain.  Skin: Negative.   Neurological: Positive for weakness.  Endo/Heme/Allergies: Negative.   Psychiatric/Behavioral: Negative.    Blood pressure 111/63, pulse 71, temperature 98 F (36.7 C), temperature source Oral, resp. rate 18, last menstrual period 12/13/2014, SpO2 90 %. Physical Exam  Constitutional: She is oriented to person, place, and time. She appears well-developed and well-nourished.  HENT:  Head: Normocephalic and atraumatic.  Eyes: Conjunctivae and EOM  are normal. Pupils are equal, round, and reactive to light.  Neck: Normal range of motion. Neck supple.  Cardiovascular: Normal rate and regular rhythm.   Respiratory: Effort normal and breath sounds normal.  GI: Soft. Bowel sounds are normal.  Musculoskeletal: Normal range of motion.  Neurological: She is alert and oriented to person, place, and time.  Skin: Skin is warm and dry.  Psychiatric: She has a normal mood and affect. Her behavior is normal. Judgment and thought content normal.   Assessment/Plan: ?Crohn;s disease with marked leukocytosis, nausea and vomiting; will proceed with an CT of the abdomen as soon as possible.   Frances Mccormick 01/06/2015, 1:47 PM

## 2015-01-06 NOTE — ED Notes (Signed)
US at bedside

## 2015-01-07 DIAGNOSIS — R112 Nausea with vomiting, unspecified: Secondary | ICD-10-CM

## 2015-01-07 LAB — COMPREHENSIVE METABOLIC PANEL
ALK PHOS: 67 U/L (ref 39–117)
ALT: 41 U/L — ABNORMAL HIGH (ref 0–35)
ANION GAP: 9 (ref 5–15)
AST: 28 U/L (ref 0–37)
Albumin: 4 g/dL (ref 3.5–5.2)
BUN: 6 mg/dL (ref 6–23)
CALCIUM: 8.9 mg/dL (ref 8.4–10.5)
CO2: 22 mmol/L (ref 19–32)
Chloride: 105 mmol/L (ref 96–112)
Creatinine, Ser: 0.66 mg/dL (ref 0.50–1.10)
GFR calc non Af Amer: >90 mL/min (ref >90)
GLUCOSE: 128 mg/dL — AB (ref 70–99)
Potassium: 3.5 mmol/L (ref 3.5–5.1)
Sodium: 136 mmol/L (ref 135–145)
TOTAL PROTEIN: 7.9 g/dL (ref 6.0–8.3)
Total Bilirubin: 0.7 mg/dL (ref 0.3–1.2)

## 2015-01-07 LAB — CBC
HCT: 41.5 % (ref 36.0–46.0)
Hemoglobin: 14.2 g/dL (ref 12.0–15.0)
MCH: 26.6 pg (ref 26.0–34.0)
MCHC: 34.2 g/dL (ref 30.0–36.0)
MCV: 77.9 fL — ABNORMAL LOW (ref 78.0–100.0)
PLATELETS: 320 10*3/uL (ref 150–400)
RBC: 5.33 MIL/uL — AB (ref 3.87–5.11)
RDW: 14.8 % (ref 11.5–15.5)
WBC: 14.1 10*3/uL — ABNORMAL HIGH (ref 4.0–10.5)

## 2015-01-07 MED ORDER — PROMETHAZINE HCL 25 MG PO TABS
25.0000 mg | ORAL_TABLET | Freq: Four times a day (QID) | ORAL | Status: DC | PRN
  Administered 2015-01-07 – 2015-01-08 (×3): 25 mg via ORAL
  Filled 2015-01-07 (×3): qty 1

## 2015-01-07 NOTE — Progress Notes (Signed)
[facility] PROGRESS NOTE  Frances Mccormick JME:[id number] DOB: [birth year] DOA: 01/06/2015 PCP: No PCP Per Patient  Assessment/Plan: 1. Nausea/vomiting/abdominal pain -Initially this was felt to be secondary to Crohn's flareup as she was treated with oral steroids and mesalamine although she had a CT scan of abdomen and pelvis performed on 01/06/2015 reported by radiology to not reveal evidence of active Crohn's disease. Other possibilities include infectious gastroenteritis.  -Patient's symptoms improving today. She reports last vomiting after 5:00 this morning. She remains on clears for which her diet will gradually be advanced today.  -Anticipate discharge in the next 24 hours if she continues to show improvement and is tolerating regular diet   Code Status: Full Code Family Communication: Spoke to her mother updated her on patient's condition Disposition Plan: Anticipate discharge in the next 24 hours if tolerating regular diet and clinically stable   Consultants:  GI  HPI/Subjective: Patient is a pleasant 29 year old female with a past history of Crohn's disease admitted to the medicine service on 01/06/2015 when she presented with multiple episodes of nausea vomiting and abdominal pain. Symptoms attributed to Crohn's flareup. Patient was seen and evaluated by GI as she was treated with oral steroids and mesalamine thousand milligrams by mouth 4 times a day. She had a CT scan of abdomen and pelvis performed on 01/06/2015 reported by radiology to not reveal evidence of active Crohn's disease.   Objective: Filed Vitals:   01/07/15 0532  BP: 160/86  Pulse: 76  Temp: 98 F (36.7 C)  Resp: 16    Intake/Output Summary (Last 24 hours) at 01/07/15 1554 Last data filed at 01/07/15 1000  Gross per 24 hour  Intake 2376.25 ml  Output    245 ml  Net 2131.25 ml   There were no vitals filed for this visit.  Exam:   General:  No acute distress reports feeling a little better,  has not vomited since 5 am  Cardiovascular: Regular rate and rhythm normal S1S2  Respiratory: Normal inspiratory effort, lungs clear to auscultation bilaterally.   Abdomen: Soft, nontender nondistended  Musculoskeletal: no edema  Data Reviewed: Basic Metabolic Panel:  Recent Labs Lab 01/06/15 0808 01/07/15 0726  NA 136 136  K 3.7 3.5  CL 103 105  CO2 24 22  GLUCOSE 156* 128*  BUN 10 6  CREATININE 0.82 0.66  CALCIUM 8.8 8.9   Liver Function Tests:  Recent Labs Lab 01/06/15 0808 01/07/15 0726  AST 47* 28  ALT 55* 41*  ALKPHOS 75 67  BILITOT 0.7 0.7  PROT 8.3 7.9  ALBUMIN 4.5 4.0    Recent Labs Lab 01/06/15 0808  LIPASE 22   No results for input(s): AMMONIA in the last 168 hours. CBC:  Recent Labs Lab 01/06/15 0808 01/07/15 0726  WBC 20.9* 14.1*  NEUTROABS 17.8*  --   HGB 14.5 14.2  HCT 44.3 41.5  MCV 80.5 77.9*  PLT 310 320   Cardiac Enzymes: No results for input(s): CKTOTAL, CKMB, CKMBINDEX, TROPONINI in the last 168 hours. BNP (last 3 results) No results for input(s): BNP in the last 8760 hours.  ProBNP (last 3 results) No results for input(s): PROBNP in the last 8760 hours.  CBG: No results for input(s): GLUCAP in the last 168 hours.  No results found for this or any previous visit (from the past 240 hour(s)).   Studies: US Abdomen Complete  01/06/2015   CLINICAL DATA:  Initial evaluation for right upper quadrant abdominal pain, nausea, and vomiting for approximately  8 hr, history of Crohn's disease  EXAM: ULTRASOUND ABDOMEN COMPLETE  COMPARISON:  10/28/2014  FINDINGS: Gallbladder: No gallstones or wall thickening visualized. No sonographic Murphy sign noted.  Common bile duct: Diameter: 5 mm  Liver: No focal lesion identified. Within normal limits in parenchymal echogenicity.  IVC: No abnormality visualized.  Pancreas: Visualized portion unremarkable.  Spleen: Size and appearance within normal limits.  Right Kidney: Length: 10 cm. Normal  echogenicity. Mild fullness of the collecting system within the kidney. Bilateral ureteral jets are identified.  Left Kidney: Length: 10 cm. Echogenicity within normal limits. No mass or hydronephrosis visualized.  Abdominal aorta: No aneurysm visualized.  Other findings: None.  IMPRESSION: Mild prominence of the intrarenal collecting system on the right, of questionable significance. Bilateral ureteral jets are identified indicating absence of complete obstruction. Possibility of a recently passed stone or nonvisualized nonobstructing right ureteral stone not excluded.   Electronically Signed   By: Skipper Cliche M.D.   On: 01/06/2015 11:10   Ct Abdomen Pelvis W Contrast  01/06/2015   CLINICAL DATA:  Crohn disease with abdominal pain and vomiting, acute onset early this morning. Leukocytosis.  EXAM: CT ABDOMEN AND PELVIS WITH CONTRAST  TECHNIQUE: Multidetector CT imaging of the abdomen and pelvis was performed using the standard protocol following bolus administration of intravenous contrast.  CONTRAST:  69mL OMNIPAQUE IOHEXOL 300 MG/ML SOLN, 134mL OMNIPAQUE IOHEXOL 300 MG/ML SOLN  COMPARISON:  10/28/2014.  FINDINGS: Lower chest: Lung bases show no acute findings. Heart size normal. No pericardial or pleural effusion.  Hepatobiliary: Liver and gallbladder are unremarkable. No biliary ductal dilatation.  Pancreas: Negative.  Spleen: Splenules, negative.  Adrenals/Urinary Tract: Adrenal glands and kidneys are unremarkable. Ureters are decompressed. Bladder is small in volume.  Stomach/Bowel: Stomach, small bowel, appendix and colon are unremarkable. Specifically, terminal ileum is normal in appearance.  Vascular/Lymphatic: Vascular structures are unremarkable. Ileocolic mesenteric lymph nodes are not enlarged by CT size criteria.  Reproductive: Uterus and ovaries are visualized.  Other: Small pelvic free fluid. Mesentery is and peritoneum are unremarkable.  Musculoskeletal:  1. No evidence of active Crohn disease.  2. Small pelvic free fluid.   Electronically Signed   By: Lorin Picket M.D.   On: 01/06/2015 19:15    Scheduled Meds: . heparin  5,000 Units Subcutaneous 3 times per day  . mesalamine  1,000 mg Oral QID  . predniSONE  50 mg Oral Q breakfast   Continuous Infusions:   Active Problems:   Exacerbation of Crohn's disease   Crohn disease    Time spent: 25 min    Kelvin Cellar  [facility] Pager [phone]. If 7PM-7AM, please contact night-coverage at [email or web address], password [facility] 01/07/2015, 3:54 PM  LOS: 1 day

## 2015-01-07 NOTE — Progress Notes (Addendum)
Cross cover for Dr. Benson Norway Subjective: Since I last evaluated the patient, her symptoms seem to have resolved completely. She denies having any abdominal pain, nausea, vomiting or diarrhea. Her abdominal CT was completely normal with no evidence of Crohn's disease.   Objective: Vital signs in last 24 hours: Temp:  [98 F (36.7 C)] 98 F (36.7 C) (02/07 0532) Pulse Rate:  [71-92] 76 (02/07 0532) Resp:  [16-20] 16 (02/07 0532) BP: (103-160)/(62-86) 160/86 mmHg (02/07 0532) SpO2:  [90 %-100 %] 98 % (02/07 0532) Last BM Date: 01/06/15  Intake/Output from previous day: 02/06 0701 - 02/07 0700 In: 1148.8 [P.O.:240; I.V.:908.8] Out: 246 [Urine:4; Emesis/NG output:242] Intake/Output this shift:   General appearance: alert, cooperative, appears stated age and no distress Resp: clear to auscultation bilaterally Cardio: regular rate and rhythm, S1, S2 normal, no murmur, click, rub or gallop GI: soft, non-tender; bowel sounds normal; no masses,  no organomegaly Extremities: extremities normal, atraumatic, no cyanosis or edema  Lab Results:  Recent Labs  01/06/15 0808 01/07/15 0726  WBC 20.9* 14.1*  HGB 14.5 14.2  HCT 44.3 41.5  PLT 310 320   BMET  Recent Labs  01/06/15 0808 01/07/15 0726  NA 136 136  K 3.7 3.5  CL 103 105  CO2 24 22  GLUCOSE 156* 128*  BUN 10 6  CREATININE 0.82 0.66  CALCIUM 8.8 8.9   LFT  Recent Labs  01/07/15 0726  PROT 7.9  ALBUMIN 4.0  AST 28  ALT 41*  ALKPHOS 67  BILITOT 0.7   Studies/Results: US Abdomen Complete  01/06/2015   CLINICAL DATA:  Initial evaluation for right upper quadrant abdominal pain, nausea, and vomiting for approximately 8 hr, history of Crohn's disease  EXAM: ULTRASOUND ABDOMEN COMPLETE  COMPARISON:  10/28/2014  FINDINGS: Gallbladder: No gallstones or wall thickening visualized. No sonographic Murphy sign noted.  Common bile duct: Diameter: 5 mm  Liver: No focal lesion identified. Within normal limits in parenchymal  echogenicity.  IVC: No abnormality visualized.  Pancreas: Visualized portion unremarkable.  Spleen: Size and appearance within normal limits.  Right Kidney: Length: 10 cm. Normal echogenicity. Mild fullness of the collecting system within the kidney. Bilateral ureteral jets are identified.  Left Kidney: Length: 10 cm. Echogenicity within normal limits. No mass or hydronephrosis visualized.  Abdominal aorta: No aneurysm visualized.  Other findings: None.  IMPRESSION: Mild prominence of the intrarenal collecting system on the right, of questionable significance. Bilateral ureteral jets are identified indicating absence of complete obstruction. Possibility of a recently passed stone or nonvisualized nonobstructing right ureteral stone not excluded.   Electronically Signed   By: Skipper Cliche M.D.   On: 01/06/2015 11:10   Ct Abdomen Pelvis W Contrast  01/06/2015   CLINICAL DATA:  Crohn disease with abdominal pain and vomiting, acute onset early this morning. Leukocytosis.  EXAM: CT ABDOMEN AND PELVIS WITH CONTRAST  TECHNIQUE: Multidetector CT imaging of the abdomen and pelvis was performed using the standard protocol following bolus administration of intravenous contrast.  CONTRAST:  71mL OMNIPAQUE IOHEXOL 300 MG/ML SOLN, 150mL OMNIPAQUE IOHEXOL 300 MG/ML SOLN  COMPARISON:  10/28/2014.  FINDINGS: Lower chest: Lung bases show no acute findings. Heart size normal. No pericardial or pleural effusion.  Hepatobiliary: Liver and gallbladder are unremarkable. No biliary ductal dilatation.  Pancreas: Negative.  Spleen: Splenules, negative.  Adrenals/Urinary Tract: Adrenal glands and kidneys are unremarkable. Ureters are decompressed. Bladder is small in volume.  Stomach/Bowel: Stomach, small bowel, appendix and colon are unremarkable. Specifically, terminal ileum  is normal in appearance.  Vascular/Lymphatic: Vascular structures are unremarkable. Ileocolic mesenteric lymph nodes are not enlarged by CT size criteria.   Reproductive: Uterus and ovaries are visualized.  Other: Small pelvic free fluid. Mesentery is and peritoneum are unremarkable.  Musculoskeletal:  1. No evidence of active Crohn disease. 2. Small pelvic free fluid.   Electronically Signed   By: Lorin Picket M.D.   On: 01/06/2015 19:15   Medications: I have reviewed the patient's current medications.  Assessment/Plan: 1) Acute abdominal pain with nausea and vomiting-resolved. I think she may have passed a stone 2) /Crohn's disease-I personally do not feel she has IBD. She tends to use a lot of NSAIDS and I feel the erosions/ulcerations noted in the T.I. On her previous colonoscopy may have been secondary to NSAIDS. There were no granulomas noted. Patient has been advised to follow up with Dr. Benson Norway for further recommendations on an OP basis. She seems to be ready for discharge. She has been advised to avoid the use of all NSAIDS.  LOS: 1 day   Oree Mirelez 01/07/2015, 11:34 AM

## 2015-01-07 NOTE — Progress Notes (Signed)
Assumed care of patient at 1600, no needs at this time. Agree with previous RN's assessment. Will continue to monitor patient.

## 2015-01-08 DIAGNOSIS — R1084 Generalized abdominal pain: Secondary | ICD-10-CM

## 2015-01-08 DIAGNOSIS — R1110 Vomiting, unspecified: Secondary | ICD-10-CM

## 2015-01-08 LAB — COMPREHENSIVE METABOLIC PANEL
ALBUMIN: 4.5 g/dL (ref 3.5–5.2)
ALT: 37 U/L — AB (ref 0–35)
ANION GAP: 12 (ref 5–15)
AST: 25 U/L (ref 0–37)
Alkaline Phosphatase: 67 U/L (ref 39–117)
BILIRUBIN TOTAL: 0.6 mg/dL (ref 0.3–1.2)
BUN: 11 mg/dL (ref 6–23)
CO2: 25 mmol/L (ref 19–32)
Calcium: 8.8 mg/dL (ref 8.4–10.5)
Chloride: 98 mmol/L (ref 96–112)
Creatinine, Ser: 0.76 mg/dL (ref 0.50–1.10)
GFR calc non Af Amer: >90 mL/min (ref >90)
GLUCOSE: 121 mg/dL — AB (ref 70–99)
POTASSIUM: 3.1 mmol/L — AB (ref 3.5–5.1)
Sodium: 135 mmol/L (ref 135–145)
TOTAL PROTEIN: 8.3 g/dL (ref 6.0–8.3)

## 2015-01-08 LAB — CBC
HEMATOCRIT: 44 % (ref 36.0–46.0)
HEMOGLOBIN: 14.9 g/dL (ref 12.0–15.0)
MCH: 26.7 pg (ref 26.0–34.0)
MCHC: 33.9 g/dL (ref 30.0–36.0)
MCV: 78.7 fL (ref 78.0–100.0)
Platelets: 310 10*3/uL (ref 150–400)
RBC: 5.59 MIL/uL — ABNORMAL HIGH (ref 3.87–5.11)
RDW: 14.8 % (ref 11.5–15.5)
WBC: 18.7 10*3/uL — ABNORMAL HIGH (ref 4.0–10.5)

## 2015-01-08 MED ORDER — PROMETHAZINE HCL 25 MG RE SUPP: 25.0000 mg | Freq: Four times a day (QID) | RECTAL | Status: DC | PRN

## 2015-01-08 MED ORDER — POTASSIUM CHLORIDE CRYS ER 20 MEQ PO TBCR
40.0000 meq | EXTENDED_RELEASE_TABLET | ORAL | Status: AC
  Administered 2015-01-08 (×2): 40 meq via ORAL
  Filled 2015-01-08 (×3): qty 2

## 2015-01-08 MED ORDER — PROMETHAZINE HCL 25 MG/ML IJ SOLN
25.0000 mg | Freq: Four times a day (QID) | INTRAMUSCULAR | Status: DC | PRN
Start: 2015-01-08 — End: 2015-01-08
  Administered 2015-01-08: 25 mg via INTRAVENOUS
  Filled 2015-01-08: qty 1

## 2015-01-08 NOTE — Discharge Summary (Addendum)
Physician Discharge Summary  Frances Mccormick ZXY:[id number] DOB: Jan 20, 1986 DOA: 01/06/2015  PCP: No PCP Per Patient  Admit date: 01/06/2015 Discharge date: 01/08/2015  Time spent: 45 minutes  Recommendations for Outpatient Follow-up:  1. F/u with GI if symptoms continue  Discharge Condition: stable Diet recommendation: soft diet  Discharge Diagnoses:  Active Problems:   Intractable vomiting and abdominal pain Leukocytosis Hypokalemia   History of present illness:  Frances Mccormick is a 29 y.o. female suspected to have a history of Crohn's disease presents after developing nausea and emesis at 3 AM this morning. Nothing she is aware of makes it better or worse.Given persistent nausea and emesis patient decided to present to the ED for further evaluation recommendations. Abd ultrasound and CT scan done in ER were unrevealing.  Hospital Course:  1. Nausea/vomiting/abdominal pain -Initially this was felt to be secondary to Crohn's flareup as she was treated with oral steroids and mesalamine although she had a CT scan of abdomen and pelvis performed on 01/06/2015 reported by radiology to not reveal evidence of active Crohn's disease. GI (Dr Collene Mares) has seen the patient and also states that she does not have Crohn's. Lipase and LFTs also normal. Most likely diagnosis is infectious gastroenteritis.  -Patient's symptoms improving. Has mild nausea but no vomiting. Had a solid BM. Will go home today with PRN phenergan suppositories.   2. Leukocytosis- initially likely due to infection and now persistent due to being on oral steroids in the hospital for possible Crohn's - these have been discontinued.   3. Hypokalemia- replaced  Procedures:    Consultations:  GI  Discharge Exam: Filed Weights   01/08/15 0733  Weight: 72.576 kg (160 lb)   Filed Vitals:   01/08/15 1127  BP: 144/90  Pulse:   Temp:   Resp:     General: AAO x 3, no distress Cardiovascular: RRR, no murmurs   Respiratory: clear to auscultation bilaterally GI: soft, non-tender, non-distended, bowel sound positive  Discharge Instructions You were cared for by a hospitalist during your hospital stay. If you have any questions about your discharge medications or the care you received while you were in the hospital after you are discharged, you can call the unit and asked to speak with the hospitalist on call if the hospitalist that took care of you is not available. Once you are discharged, your primary care physician will handle any further medical issues. Please note that NO REFILLS for any discharge medications will be authorized once you are discharged, as it is imperative that you return to your primary care physician (or establish a relationship with a primary care physician if you do not have one) for your aftercare needs so that they can reassess your need for medications and monitor your lab values.     Medication List    STOP taking these medications        oxyCODONE-acetaminophen 5-325 MG per tablet  Commonly known as:  PERCOCET/ROXICET     predniSONE 10 MG tablet- was not taking   Commonly known as:  DELTASONE     promethazine 25 MG tablet  Commonly known as:  PHENERGAN  Replaced by:  promethazine 25 MG suppository      TAKE these medications        DSS 100 MG Caps  Take 200 mg by mouth 2 (two) times daily.     ibuprofen 200 MG tablet  Commonly known as:  ADVIL,MOTRIN  Take 400 mg by mouth every 6 (six) hours  as needed for mild pain.     promethazine 25 MG suppository  Commonly known as:  PHENERGAN  Place 1 suppository (25 mg total) rectally every 6 (six) hours as needed for nausea or vomiting.     TYLENOL COLD MULTI-SYMPTOM PO  Take 10 mLs by mouth every 6 (six) hours as needed (cough).       Allergies  Allergen Reactions  . Bee Venom Anaphylaxis  . Ciprofloxacin Anaphylaxis      The results of significant diagnostics from this hospitalization (including  imaging, microbiology, ancillary and laboratory) are listed below for reference.    Significant Diagnostic Studies: US Abdomen Complete  01/06/2015   CLINICAL DATA:  Initial evaluation for right upper quadrant abdominal pain, nausea, and vomiting for approximately 8 hr, history of Crohn's disease  EXAM: ULTRASOUND ABDOMEN COMPLETE  COMPARISON:  10/28/2014  FINDINGS: Gallbladder: No gallstones or wall thickening visualized. No sonographic Murphy sign noted.  Common bile duct: Diameter: 5 mm  Liver: No focal lesion identified. Within normal limits in parenchymal echogenicity.  IVC: No abnormality visualized.  Pancreas: Visualized portion unremarkable.  Spleen: Size and appearance within normal limits.  Right Kidney: Length: 10 cm. Normal echogenicity. Mild fullness of the collecting system within the kidney. Bilateral ureteral jets are identified.  Left Kidney: Length: 10 cm. Echogenicity within normal limits. No mass or hydronephrosis visualized.  Abdominal aorta: No aneurysm visualized.  Other findings: None.  IMPRESSION: Mild prominence of the intrarenal collecting system on the right, of questionable significance. Bilateral ureteral jets are identified indicating absence of complete obstruction. Possibility of a recently passed stone or nonvisualized nonobstructing right ureteral stone not excluded.   Electronically Signed   By: Skipper Cliche M.D.   On: 01/06/2015 11:10   Ct Abdomen Pelvis W Contrast  01/06/2015   CLINICAL DATA:  Crohn disease with abdominal pain and vomiting, acute onset early this morning. Leukocytosis.  EXAM: CT ABDOMEN AND PELVIS WITH CONTRAST  TECHNIQUE: Multidetector CT imaging of the abdomen and pelvis was performed using the standard protocol following bolus administration of intravenous contrast.  CONTRAST:  38m OMNIPAQUE IOHEXOL 300 MG/ML SOLN, 1067mOMNIPAQUE IOHEXOL 300 MG/ML SOLN  COMPARISON:  10/28/2014.  FINDINGS: Lower chest: Lung bases show no acute findings. Heart size  normal. No pericardial or pleural effusion.  Hepatobiliary: Liver and gallbladder are unremarkable. No biliary ductal dilatation.  Pancreas: Negative.  Spleen: Splenules, negative.  Adrenals/Urinary Tract: Adrenal glands and kidneys are unremarkable. Ureters are decompressed. Bladder is small in volume.  Stomach/Bowel: Stomach, small bowel, appendix and colon are unremarkable. Specifically, terminal ileum is normal in appearance.  Vascular/Lymphatic: Vascular structures are unremarkable. Ileocolic mesenteric lymph nodes are not enlarged by CT size criteria.  Reproductive: Uterus and ovaries are visualized.  Other: Small pelvic free fluid. Mesentery is and peritoneum are unremarkable.  Musculoskeletal:  1. No evidence of active Crohn disease. 2. Small pelvic free fluid.   Electronically Signed   By: MeLorin Picket.D.   On: 01/06/2015 19:15    Microbiology: No results found for this or any previous visit (from the past 240 hour(s)).   Labs: Basic Metabolic Panel:  Recent Labs Lab 01/06/15 0808 01/07/15 0726 01/08/15 0610  NA 136 136 135  K 3.7 3.5 3.1*  CL 103 105 98  CO2 24 22 25   GLUCOSE 156* 128* 121*  BUN 10 6 11   CREATININE 0.82 0.66 0.76  CALCIUM 8.8 8.9 8.8   Liver Function Tests:  Recent Labs Lab 01/06/15 0808 01/07/15 0726  01/08/15 0610  AST 47* 28 25  ALT 55* 41* 37*  ALKPHOS 75 67 67  BILITOT 0.7 0.7 0.6  PROT 8.3 7.9 8.3  ALBUMIN 4.5 4.0 4.5    Recent Labs Lab 01/06/15 0808  LIPASE 22   No results for input(s): AMMONIA in the last 168 hours. CBC:  Recent Labs Lab 01/06/15 0808 01/07/15 0726 01/08/15 0610  WBC 20.9* 14.1* 18.7*  NEUTROABS 17.8*  --   --   HGB 14.5 14.2 14.9  HCT 44.3 41.5 44.0  MCV 80.5 77.9* 78.7  PLT 310 320 310   Cardiac Enzymes: No results for input(s): CKTOTAL, CKMB, CKMBINDEX, TROPONINI in the last 168 hours. BNP: BNP (last 3 results) No results for input(s): BNP in the last 8760 hours.  ProBNP (last 3 results) No  results for input(s): PROBNP in the last 8760 hours.  CBG: No results for input(s): GLUCAP in the last 168 hours.     SignedDebbe Odea, MD [facility] 01/08/2015, 11:50 AM

## 2015-01-08 NOTE — Progress Notes (Signed)
MD text  paged patient BP 162/92 manually, await instructions.

## 2015-01-08 NOTE — Progress Notes (Signed)
Clinical Social Work Department BRIEF PSYCHOSOCIAL ASSESSMENT 01/08/2015  Patient:  Frances Mccormick, Frances Mccormick     Account Number:  [id number]     Admit date:  01/06/2015  Clinical Social Worker:  Earlie Server  Date/Time:  01/08/2015 04:00 PM  Referred by:  Physician  Date Referred:  01/08/2015 Referred for  Psychosocial assessment   Other Referral:   Interview type:  Patient Other interview type:    PSYCHOSOCIAL DATA Living Status:  FAMILY Admitted from facility:   Level of care:   Primary support name:  Barnett Applebaum Primary support relationship to patient:  PARENT Degree of support available:   Strong    CURRENT CONCERNS Current Concerns  Other - See comment   Other Concerns:   Consult placed due to patient not having insurance.    SOCIAL WORK ASSESSMENT / PLAN CSW received referral due to patient being admitted and having concerns with insurance. CSW spoke with CM who will follow up with insurance and offer any assistance re: PCP. CSW met with patient at bedside. CSW introduced myself and explained role.    Patient reports she lives at home with her mother. Patient works in Therap[employer], art and reports her job is supportive when she needs to be hospitalized. Patient reports she used to be compliant with PCP follow up appointments and medication but concerns since no insurance now. CSW offered community resources but patient reports no needs. Patient states once she gets her insurance problems worked out then she will have no concerns. Patient thanked CSW for visit but reports no concerns.   Assessment/plan status:  No Further Intervention Required Other assessment/ plan:   Information/referral to community resources:   Patient politely declined any resources    PATIENT'S/FAMILY'S RESPONSE TO PLAN OF CARE: Patient alert and oriented. Patient reports she is used to having medical problems due to Chron's and is hopeful to feel better soon. Patient reports it is stressful to be in  the hospital and wants to feel better so she can return to work quickly. Patient reports that she appreciates CSW visit but does not have any needs at this time.       Sindy Messing, LCSW (Coverage for [employer])

## 2015-01-08 NOTE — Progress Notes (Signed)
Discharge instructions given, no questions at this time.  Patient does request note to return work.  Provider no longer at hospital.  Instructed pt to call in am to see if note can be provided

## 2015-04-10 ENCOUNTER — Encounter (HOSPITAL_COMMUNITY): Payer: Self-pay | Admitting: Emergency Medicine

## 2015-04-10 ENCOUNTER — Emergency Department (HOSPITAL_COMMUNITY)
Admission: EM | Admit: 2015-04-10 | Discharge: 2015-04-10 | Disposition: A | Discharge status: Home / Self Care | Payer: No Typology Code available for payment source | Attending: Emergency Medicine | Admitting: Emergency Medicine

## 2015-04-10 DIAGNOSIS — R109 Unspecified abdominal pain: Secondary | ICD-10-CM | POA: Insufficient documentation

## 2015-04-10 DIAGNOSIS — Z3202 Encounter for pregnancy test, result negative: Secondary | ICD-10-CM | POA: Insufficient documentation

## 2015-04-10 DIAGNOSIS — R197 Diarrhea, unspecified: Secondary | ICD-10-CM | POA: Insufficient documentation

## 2015-04-10 DIAGNOSIS — R112 Nausea with vomiting, unspecified: Secondary | ICD-10-CM

## 2015-04-10 DIAGNOSIS — Z8719 Personal history of other diseases of the digestive system: Secondary | ICD-10-CM | POA: Diagnosis not present

## 2015-04-10 DIAGNOSIS — Z9119 Patient's noncompliance with other medical treatment and regimen: Secondary | ICD-10-CM | POA: Diagnosis not present

## 2015-04-10 DIAGNOSIS — G8929 Other chronic pain: Secondary | ICD-10-CM | POA: Insufficient documentation

## 2015-04-10 DIAGNOSIS — Z79899 Other long term (current) drug therapy: Secondary | ICD-10-CM | POA: Diagnosis not present

## 2015-04-10 LAB — CBC WITH DIFFERENTIAL/PLATELET
BASOS ABS: 0 10*3/uL (ref 0.0–0.1)
Basophils Relative: 0 % (ref 0–1)
Eosinophils Absolute: 0 10*3/uL (ref 0.0–0.7)
Eosinophils Relative: 0 % (ref 0–5)
HCT: 45.3 % (ref 36.0–46.0)
Hemoglobin: 15.4 g/dL — ABNORMAL HIGH (ref 12.0–15.0)
LYMPHS ABS: 1.4 10*3/uL (ref 0.7–4.0)
Lymphocytes Relative: 8 % — ABNORMAL LOW (ref 12–46)
MCH: 26.8 pg (ref 26.0–34.0)
MCHC: 34 g/dL (ref 30.0–36.0)
MCV: 78.8 fL (ref 78.0–100.0)
MONO ABS: 0.3 10*3/uL (ref 0.1–1.0)
Monocytes Relative: 2 % — ABNORMAL LOW (ref 3–12)
Neutro Abs: 15.7 10*3/uL — ABNORMAL HIGH (ref 1.7–7.7)
Neutrophils Relative %: 90 % — ABNORMAL HIGH (ref 43–77)
PLATELETS: 265 10*3/uL (ref 150–400)
RBC: 5.75 MIL/uL — ABNORMAL HIGH (ref 3.87–5.11)
RDW: 14.8 % (ref 11.5–15.5)
WBC: 17.4 10*3/uL — ABNORMAL HIGH (ref 4.0–10.5)

## 2015-04-10 LAB — COMPREHENSIVE METABOLIC PANEL
ALT: 12 U/L — AB (ref 14–54)
ANION GAP: 15 (ref 5–15)
AST: 25 U/L (ref 15–41)
Albumin: 4.6 g/dL (ref 3.5–5.0)
Alkaline Phosphatase: 73 U/L (ref 38–126)
BUN: 5 mg/dL — ABNORMAL LOW (ref 6–20)
CALCIUM: 8.7 mg/dL — AB (ref 8.9–10.3)
CO2: 21 mmol/L — ABNORMAL LOW (ref 22–32)
CREATININE: 0.69 mg/dL (ref 0.44–1.00)
Chloride: 99 mmol/L — ABNORMAL LOW (ref 101–111)
GFR calc non Af Amer: >60 mL/min (ref >60)
GLUCOSE: 112 mg/dL — AB (ref 70–99)
Potassium: 3.7 mmol/L (ref 3.5–5.1)
Sodium: 135 mmol/L (ref 135–145)
TOTAL PROTEIN: 8.9 g/dL — AB (ref 6.5–8.1)
Total Bilirubin: 0.5 mg/dL (ref 0.3–1.2)

## 2015-04-10 LAB — URINALYSIS, ROUTINE W REFLEX MICROSCOPIC
Bilirubin Urine: NEGATIVE
GLUCOSE, UA: NEGATIVE mg/dL
KETONES UR: NEGATIVE mg/dL
LEUKOCYTES UA: NEGATIVE
Nitrite: NEGATIVE
Protein, ur: NEGATIVE mg/dL
Specific Gravity, Urine: 1.007 (ref 1.005–1.030)
Urobilinogen, UA: 0.2 mg/dL (ref 0.0–1.0)
pH: 8 (ref 5.0–8.0)

## 2015-04-10 LAB — URINE MICROSCOPIC-ADD ON

## 2015-04-10 LAB — LIPASE, BLOOD: Lipase: 21 U/L — ABNORMAL LOW (ref 22–51)

## 2015-04-10 LAB — POC URINE PREG, ED: PREG TEST UR: NEGATIVE

## 2015-04-10 MED ORDER — PROMETHAZINE HCL 25 MG/ML IJ SOLN
25.0000 mg | Freq: Once | INTRAMUSCULAR | Status: AC
  Administered 2015-04-10: 25 mg via INTRAVENOUS
  Filled 2015-04-10: qty 1

## 2015-04-10 MED ORDER — METOCLOPRAMIDE HCL 5 MG/ML IJ SOLN
10.0000 mg | Freq: Once | INTRAMUSCULAR | Status: AC
  Administered 2015-04-10: 10 mg via INTRAVENOUS
  Filled 2015-04-10: qty 2

## 2015-04-10 MED ORDER — PROMETHAZINE HCL 25 MG PO TABS
25.0000 mg | ORAL_TABLET | Freq: Four times a day (QID) | ORAL | Status: DC | PRN
Start: 2015-04-10 — End: 2015-04-13

## 2015-04-10 MED ORDER — ONDANSETRON HCL 4 MG/2ML IJ SOLN
4.0000 mg | Freq: Once | INTRAMUSCULAR | Status: AC
  Administered 2015-04-10: 4 mg via INTRAVENOUS
  Filled 2015-04-10: qty 2

## 2015-04-10 MED ORDER — DIPHENHYDRAMINE HCL 50 MG/ML IJ SOLN
50.0000 mg | Freq: Once | INTRAMUSCULAR | Status: AC
  Administered 2015-04-10: 50 mg via INTRAVENOUS
  Filled 2015-04-10: qty 1

## 2015-04-10 MED ORDER — DICYCLOMINE HCL 10 MG/ML IM SOLN
20.0000 mg | Freq: Once | INTRAMUSCULAR | Status: AC
  Administered 2015-04-10: 20 mg via INTRAMUSCULAR
  Filled 2015-04-10: qty 2

## 2015-04-10 MED ORDER — SODIUM CHLORIDE 0.9 % IV BOLUS (SEPSIS)
1000.0000 mL | Freq: Once | INTRAVENOUS | Status: AC
  Administered 2015-04-10: 1000 mL via INTRAVENOUS

## 2015-04-10 MED ORDER — DICYCLOMINE HCL 20 MG PO TABS: 20.0000 mg | ORAL_TABLET | Freq: Two times a day (BID) | ORAL | Status: DC

## 2015-04-10 MED ORDER — PROMETHAZINE HCL 25 MG RE SUPP: 25.0000 mg | Freq: Four times a day (QID) | RECTAL | Status: DC | PRN

## 2015-04-10 MED ORDER — ONDANSETRON 4 MG PO TBDP: 4.0000 mg | ORAL_TABLET | Freq: Three times a day (TID) | ORAL | Status: DC | PRN

## 2015-04-10 MED ORDER — KETOROLAC TROMETHAMINE 30 MG/ML IJ SOLN
30.0000 mg | Freq: Once | INTRAMUSCULAR | Status: AC
  Administered 2015-04-10: 30 mg via INTRAVENOUS
  Filled 2015-04-10: qty 1

## 2015-04-10 NOTE — ED Notes (Signed)
Pt aware urine sample is needed 

## 2015-04-10 NOTE — ED Notes (Signed)
Phlebotomy been called x2 due to prolonged delay, Stanton Kidney states coming to obtain blood sample.

## 2015-04-10 NOTE — Discharge Instructions (Signed)
Abdominal Pain, Women Abdominal (stomach, pelvic, or belly) pain can be caused by many things. It is important to tell your doctor:  The location of the pain.  Does it come and go or is it present all the time?  Are there things that start the pain (eating certain foods, exercise)?  Are there other symptoms associated with the pain (fever, nausea, vomiting, diarrhea)? All of this is helpful to know when trying to find the cause of the pain. CAUSES   Stomach: virus or bacteria infection, or ulcer.  Intestine: appendicitis (inflamed appendix), regional ileitis (Crohn's disease), ulcerative colitis (inflamed colon), irritable bowel syndrome, diverticulitis (inflamed diverticulum of the colon), or cancer of the stomach or intestine.  Gallbladder disease or stones in the gallbladder.  Kidney disease, kidney stones, or infection.  Pancreas infection or cancer.  Fibromyalgia (pain disorder).  Diseases of the female organs:  Uterus: fibroid (non-cancerous) tumors or infection.  Fallopian tubes: infection or tubal pregnancy.  Ovary: cysts or tumors.  Pelvic adhesions (scar tissue).  Endometriosis (uterus lining tissue growing in the pelvis and on the pelvic organs).  Pelvic congestion syndrome (female organs filling up with blood just before the menstrual period).  Pain with the menstrual period.  Pain with ovulation (producing an egg).  Pain with an IUD (intrauterine device, birth control) in the uterus.  Cancer of the female organs.  Functional pain (pain not caused by a disease, may improve without treatment).  Psychological pain.  Depression. DIAGNOSIS  Your doctor will decide the seriousness of your pain by doing an examination.  Blood tests.  X-rays.  Ultrasound.  CT scan (computed tomography, special type of X-ray).  MRI (magnetic resonance imaging).  Cultures, for infection.  Barium enema (dye inserted in the large intestine, to better view it with  X-rays).  Colonoscopy (looking in intestine with a lighted tube).  Laparoscopy (minor surgery, looking in abdomen with a lighted tube).  Major abdominal exploratory surgery (looking in abdomen with a large incision). TREATMENT  The treatment will depend on the cause of the pain.   Many cases can be observed and treated at home.  Over-the-counter medicines recommended by your caregiver.  Prescription medicine.  Antibiotics, for infection.  Birth control pills, for painful periods or for ovulation pain.  Hormone treatment, for endometriosis.  Nerve blocking injections.  Physical therapy.  Antidepressants.  Counseling with a psychologist or psychiatrist.  Minor or major surgery. HOME CARE INSTRUCTIONS   Do not take laxatives, unless directed by your caregiver.  Take over-the-counter pain medicine only if ordered by your caregiver. Do not take aspirin because it can cause an upset stomach or bleeding.  Try a clear liquid diet (broth or water) as ordered by your caregiver. Slowly move to a bland diet, as tolerated, if the pain is related to the stomach or intestine.  Have a thermometer and take your temperature several times a day, and record it.  Bed rest and sleep, if it helps the pain.  Avoid sexual intercourse, if it causes pain.  Avoid stressful situations.  Keep your follow-up appointments and tests, as your caregiver orders.  If the pain does not go away with medicine or surgery, you may try:  Acupuncture.  Relaxation exercises (yoga, meditation).  Group therapy.  Counseling. SEEK MEDICAL CARE IF:   You notice certain foods cause stomach pain.  Your home care treatment is not helping your pain.  You need stronger pain medicine.  You want your IUD removed.  You feel faint or  lightheaded.  You develop nausea and vomiting.  You develop a rash.  You are having side effects or an allergy to your medicine. SEEK IMMEDIATE MEDICAL CARE IF:   Your  pain does not go away or gets worse.  You have a fever.  Your pain is felt only in portions of the abdomen. The right side could possibly be appendicitis. The left lower portion of the abdomen could be colitis or diverticulitis.  You are passing blood in your stools (bright red or black tarry stools, with or without vomiting).  You have blood in your urine.  You develop chills, with or without a fever.  You pass out. MAKE SURE YOU:   Understand these instructions.  Will watch your condition.  Will get help right away if you are not doing well or get worse. Document Released: 09/14/2007 Document Revised: 04/03/2014 Document Reviewed: 10/04/2009 [facility] Patient Information 2015 [place], [place]. This information is not intended to replace advice given to you by your health care provider. Make sure you discuss any questions you have with your health care provider.  Diarrhea Diarrhea is frequent loose and watery bowel movements. It can cause you to feel weak and dehydrated. Dehydration can cause you to become tired and thirsty, have a dry mouth, and have decreased urination that often is dark yellow. Diarrhea is a sign of another problem, most often an infection that will not last long. In most cases, diarrhea typically lasts 2-3 days. However, it can last longer if it is a sign of something more serious. It is important to treat your diarrhea as directed by your caregiver to lessen or prevent future episodes of diarrhea. CAUSES  Some common causes include:  Gastrointestinal infections caused by viruses, bacteria, or parasites.  Food poisoning or food allergies.  Certain medicines, such as antibiotics, chemotherapy, and laxatives.  Artificial sweeteners and fructose.  Digestive disorders. HOME CARE INSTRUCTIONS  Ensure adequate fluid intake (hydration): Have 1 cup (8 oz) of fluid for each diarrhea episode. Avoid fluids that contain simple sugars or sports drinks, fruit juices,  whole milk products, and sodas. Your urine should be clear or pale yellow if you are drinking enough fluids. Hydrate with an oral rehydration solution that you can purchase at pharmacies, retail stores, and online. You can prepare an oral rehydration solution at home by mixing the following ingredients together:   - tsp table salt.   tsp baking soda.   tsp salt substitute containing potassium chloride.  1  tablespoons sugar.  1 L (34 oz) of water.  Certain foods and beverages may increase the speed at which food moves through the gastrointestinal (GI) tract. These foods and beverages should be avoided and include:  Caffeinated and alcoholic beverages.  High-fiber foods, such as raw fruits and vegetables, nuts, seeds, and whole grain breads and cereals.  Foods and beverages sweetened with sugar alcohols, such as xylitol, sorbitol, and mannitol.  Some foods may be well tolerated and may help thicken stool including:  Starchy foods, such as rice, toast, pasta, low-sugar cereal, oatmeal, grits, baked potatoes, crackers, and bagels.  Bananas.  Applesauce.  Add probiotic-rich foods to help increase healthy bacteria in the GI tract, such as yogurt and fermented milk products.  Wash your hands well after each diarrhea episode.  Only take over-the-counter or prescription medicines as directed by your caregiver.  Take a warm bath to relieve any burning or pain from frequent diarrhea episodes. SEEK IMMEDIATE MEDICAL CARE IF:   You are unable to  keep fluids down.  You have persistent vomiting.  You have blood in your stool, or your stools are black and tarry.  You do not urinate in 6-8 hours, or there is only a small amount of very dark urine.  You have abdominal pain that increases or localizes.  You have weakness, dizziness, confusion, or light-headedness.  You have a severe headache.  Your diarrhea gets worse or does not get better.  You have a fever or persistent  symptoms for more than 2-3 days.  You have a fever and your symptoms suddenly get worse. MAKE SURE YOU:   Understand these instructions.  Will watch your condition.  Will get help right away if you are not doing well or get worse. Document Released: 11/07/2002 Document Revised: 04/03/2014 Document Reviewed: 07/25/2012 [facility] Patient Information 2015 [place], [place]. This information is not intended to replace advice given to you by your health care provider. Make sure you discuss any questions you have with your health care provider.  Nausea and Vomiting Nausea is a sick feeling that often comes before throwing up (vomiting). Vomiting is a reflex where stomach contents come out of your mouth. Vomiting can cause severe loss of body fluids (dehydration). Children and elderly adults can become dehydrated quickly, especially if they also have diarrhea. Nausea and vomiting are symptoms of a condition or disease. It is important to find the cause of your symptoms. CAUSES   Direct irritation of the stomach lining. This irritation can result from increased acid production (gastroesophageal reflux disease), infection, food poisoning, taking certain medicines (such as nonsteroidal anti-inflammatory drugs), alcohol use, or tobacco use.  Signals from the brain.These signals could be caused by a headache, heat exposure, an inner ear disturbance, increased pressure in the brain from injury, infection, a tumor, or a concussion, pain, emotional stimulus, or metabolic problems.  An obstruction in the gastrointestinal tract (bowel obstruction).  Illnesses such as diabetes, hepatitis, gallbladder problems, appendicitis, kidney problems, cancer, sepsis, atypical symptoms of a heart attack, or eating disorders.  Medical treatments such as chemotherapy and radiation.  Receiving medicine that makes you sleep (general anesthetic) during surgery. DIAGNOSIS Your caregiver may ask for tests to be done if the  problems do not improve after a few days. Tests may also be done if symptoms are severe or if the reason for the nausea and vomiting is not clear. Tests may include:  Urine tests.  Blood tests.  Stool tests.  Cultures (to look for evidence of infection).  X-rays or other imaging studies. Test results can help your caregiver make decisions about treatment or the need for additional tests. TREATMENT You need to stay well hydrated. Drink frequently but in small amounts.You may wish to drink water, sports drinks, clear broth, or eat frozen ice pops or gelatin dessert to help stay hydrated.When you eat, eating slowly may help prevent nausea.There are also some antinausea medicines that may help prevent nausea. HOME CARE INSTRUCTIONS   Take all medicine as directed by your caregiver.  If you do not have an appetite, do not force yourself to eat. However, you must continue to drink fluids.  If you have an appetite, eat a normal diet unless your caregiver tells you differently.  Eat a variety of complex carbohydrates (rice, wheat, potatoes, bread), lean meats, yogurt, fruits, and vegetables.  Avoid high-fat foods because they are more difficult to digest.  Drink enough water and fluids to keep your urine clear or pale yellow.  If you are dehydrated, ask your  caregiver for specific rehydration instructions. Signs of dehydration may include:  Severe thirst.  Dry lips and mouth.  Dizziness.  Dark urine.  Decreasing urine frequency and amount.  Confusion.  Rapid breathing or pulse. SEEK IMMEDIATE MEDICAL CARE IF:   You have blood or brown flecks (like coffee grounds) in your vomit.  You have black or bloody stools.  You have a severe headache or stiff neck.  You are confused.  You have severe abdominal pain.  You have chest pain or trouble breathing.  You do not urinate at least once every 8 hours.  You develop cold or clammy skin.  You continue to vomit for longer  than 24 to 48 hours.  You have a fever. MAKE SURE YOU:   Understand these instructions.  Will watch your condition.  Will get help right away if you are not doing well or get worse. Document Released: 11/17/2005 Document Revised: 02/09/2012 Document Reviewed: 04/16/2011 [facility] Patient Information 2015 [place], [place]. This information is not intended to replace advice given to you by your health care provider. Make sure you discuss any questions you have with your health care provider.

## 2015-04-10 NOTE — ED Provider Notes (Signed)
TIME SEEN: 12:40 PM  CHIEF COMPLAINT: Nausea, vomiting, abdominal pain  HPI: Patient is a 29 y.o. F with history of Crohn's disease who is not on any medications who has no primary care physician or current gastroenterologist who presents to the emergency department with diffuse abdominal pain, vomiting started at 2 AM. States this feels like a Crohn's flare. Has had chills but no fever. Also reports diarrhea. No bloody stools or melena. Last menstrual period was one week ago. Denies fever. 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 No history of abdominal surgeries. No recent travel. Has been admitted before for intractable vomiting.  ROS: See HPI Constitutional: no fever  Eyes: no drainage  ENT: no runny nose   Cardiovascular:  no chest pain  Resp: no SOB  GI: vomiting GU: no dysuria Integumentary: no rash  Allergy: no hives  Musculoskeletal: no leg swelling  Neurological: no slurred speech ROS otherwise negative  PAST MEDICAL HISTORY/PAST SURGICAL HISTORY:  Past Medical History  Diagnosis Date  . Crohn's disease   . Non-compliant patient     MEDICATIONS:  Prior to Admission medications   Medication Sig Start Date End Date Taking? Authorizing Provider  docusate sodium 100 MG CAPS Take 200 mg by mouth 2 (two) times daily. Patient not taking: Reported on 01/06/2015 10/31/14   Reyne Dumas, MD  ibuprofen (ADVIL,MOTRIN) 200 MG tablet Take 400 mg by mouth every 6 (six) hours as needed for mild pain.    Historical Provider, MD  Phenyleph-CPM-DM-APAP (TYLENOL COLD MULTI-SYMPTOM PO) Take 10 mLs by mouth every 6 (six) hours as needed (cough).    Historical Provider, MD  promethazine (PHENERGAN) 25 MG suppository Place 1 suppository (25 mg total) rectally every 6 (six) hours as needed for nausea or vomiting. 01/08/15   Debbe Odea, MD    ALLERGIES:  Allergies  Allergen Reactions  . Bee Venom Anaphylaxis  . Ciprofloxacin Anaphylaxis    SOCIAL HISTORY:  History   Substance Use Topics  . Smoking status: Never Smoker   . Smokeless tobacco: Not on file  . Alcohol Use: Yes     Comment: ocassionally    FAMILY HISTORY: History reviewed. No pertinent family history.  EXAM: BP 150/106 mmHg  Pulse 74  Temp(Src) 98.6 F (37 C) (Oral)  Resp 16  SpO2 100% CONSTITUTIONAL: Alert and oriented and responds appropriately to questions. Well-appearing; well-nourished, nontoxic, in no distress HEAD: Normocephalic EYES: Conjunctivae clear, PERRL ENT: normal nose; no rhinorrhea; moist mucous membranes; pharynx without lesions noted NECK: Supple, no meningismus, no LAD  CARD: RRR; S1 and S2 appreciated; no murmurs, no clicks, no rubs, no gallops RESP: Normal chest excursion without splinting or tachypnea; breath sounds clear and equal bilaterally; no wheezes, no rhonchi, no rales, no hypoxia or respiratory distress, speaking full sentences ABD/GI: Normal bowel sounds; non-distended; soft, non-tender, no rebound, no guarding, no peritoneal signs, patient is distracted her abdominal exam is completely benign BACK:  The back appears normal and is non-tender to palpation, there is no CVA tenderness EXT: Normal ROM in all joints; non-tender to palpation; no edema; normal capillary refill; no cyanosis, no calf tenderness or swelling    SKIN: Normal color for age and race; warm NEURO: Moves all extremities equally, sensation to light touch intact diffusely, cranial nerves II through XII intact PSYCH: The patient's mood and manner are appropriate. Grooming and personal hygiene are appropriate.  MEDICAL DECISION MAKING: Patient here with vomiting, diarrhea and abdominal pain. Her abdominal exam is  completely benign. She is afebrile, hemodynamically stable. We'll labs, urinalysis, urine pregnancy test. We'll give IV fluids, Bentyl, Zofran and reassess.  ED PROGRESS: Patient still vomiting after Zofran. We'll give another dose of Zofran as well as  Phenergan.   3:30 PM   Pt reports she is still feeling nauseated but no longer vomiting after Zofran and Phenergan. Will give dose of Reglan with Benadryl. Also complaining of continued abdominal pain. We'll give Toradol. Labs show leukocytosis which is chronic for patient. Bicarbonate is slightly low at 21 but otherwise electrolytes, LFTs, lipase reassuring. Urine shows no sign of infection. Urine pregnancy test today is negative, will not cross over into Epic.     4:00 PM  Will po challenge patient.  4:30 PM  Pt able to drink water without difficulty and has not had vomiting and several hours. Is requesting something else for pain. Have explained to family that I have given her Toradol and Bentyl for pain. Her abdominal exam is still benign. I do not feel narcotics are indicated for her chronic pain. Mother is requesting we discharge her with something for pain but I have advised them again that because this is a chronic condition I will not discharge her with narcotics. We'll discharge with prescription for Bentyl as well as Phenergan suppositories and oral Phenergan tablets, Zofran ODT. Have strongly advised her to follow-up with a primary care physician and we'll give [facility] and Have Advised Her to Follow-Up with Her Gastroenterologist That She Was Previously Seeing Dr. Benson Norway. I have very low suspicion this is an actual Crohn's exacerbation given her very benign examination has not diarrhea today in the ED. She's had multiple CT scans in the past for similar symptoms have always been completely unremarkable. Discussed return precautions. She verbalized understanding and is comfortable with plan.   [place], DO 04/10/15 1642

## 2015-04-10 NOTE — ED Notes (Signed)
Pt states she awoke around 02:00 this am and began vomiting. States she took some phenergan she had at home but it hasn't stopped. Pt states she is having diarrhea, chills, and throbbing pain going from her stomach into her back.

## 2015-04-11 ENCOUNTER — Emergency Department (HOSPITAL_COMMUNITY)
Admission: EM | Admit: 2015-04-11 | Discharge: 2015-04-12 | Disposition: A | Discharge status: Home / Self Care | Payer: No Typology Code available for payment source | Attending: Emergency Medicine | Admitting: Emergency Medicine

## 2015-04-11 ENCOUNTER — Encounter (HOSPITAL_COMMUNITY): Payer: Self-pay | Admitting: Emergency Medicine

## 2015-04-11 DIAGNOSIS — Z3202 Encounter for pregnancy test, result negative: Secondary | ICD-10-CM | POA: Diagnosis not present

## 2015-04-11 DIAGNOSIS — Z9119 Patient's noncompliance with other medical treatment and regimen: Secondary | ICD-10-CM | POA: Diagnosis not present

## 2015-04-11 DIAGNOSIS — R1084 Generalized abdominal pain: Secondary | ICD-10-CM | POA: Diagnosis present

## 2015-04-11 DIAGNOSIS — K5010 Crohn's disease of large intestine without complications: Secondary | ICD-10-CM | POA: Insufficient documentation

## 2015-04-11 DIAGNOSIS — R109 Unspecified abdominal pain: Secondary | ICD-10-CM

## 2015-04-11 NOTE — ED Provider Notes (Signed)
CSN: [id number]     Arrival date & time 04/11/15  2236 History   First MD Initiated Contact with Patient 04/11/15 2353     Chief Complaint  Patient presents with  . Emesis  . Abdominal Pain  . Loss of Consciousness     (Consider location/radiation/quality/duration/timing/severity/associated sxs/prior Treatment) Patient is a 29 y.o. female presenting with abdominal pain.  Abdominal Pain Pain location:  Generalized Pain quality: cramping   Pain radiates to:  Does not radiate Pain severity:  Severe Onset quality:  Gradual Duration:  2 days Timing:  Constant Progression:  Waxing and waning Chronicity:  Recurrent Context comment:  Ho chron's, not on medication Relieved by:  Nothing Worsened by:  Movement, palpation and eating Ineffective treatments:  None tried Associated symptoms: nausea and vomiting   Associated symptoms: no diarrhea and no fever     Past Medical History  Diagnosis Date  . Crohn's disease   . Non-compliant patient    Past Surgical History  Procedure Laterality Date  . Induced abortion     History reviewed. No pertinent family history. History  Substance Use Topics  . Smoking status: Never Smoker   . Smokeless tobacco: Not on file  . Alcohol Use: Yes     Comment: ocassionally   OB History    No data available     Review of Systems  Constitutional: Negative for fever.  Gastrointestinal: Positive for nausea, vomiting and abdominal pain. Negative for diarrhea.  All other systems reviewed and are negative.     Allergies  Bee venom and Ciprofloxacin  Home Medications   Prior to Admission medications   Medication Sig Start Date End Date Taking? Authorizing Provider  dicyclomine (BENTYL) 20 MG tablet Take 1 tablet (20 mg total) by mouth 2 (two) times daily. As needed for pain 04/10/15  Yes Kristen N Ward, DO  ibuprofen (ADVIL,MOTRIN) 200 MG tablet Take 400 mg by mouth every 6 (six) hours as needed for mild pain.   Yes Historical Provider, MD   Phenyleph-CPM-DM-APAP (TYLENOL COLD MULTI-SYMPTOM PO) Take 10 mLs by mouth every 6 (six) hours as needed (cough).   Yes Historical Provider, MD  promethazine (PHENERGAN) 25 MG suppository Place 1 suppository (25 mg total) rectally every 6 (six) hours as needed for nausea or vomiting. 04/10/15  Yes Kristen N Ward, DO  promethazine (PHENERGAN) 25 MG tablet Take 1 tablet (25 mg total) by mouth every 6 (six) hours as needed for nausea or vomiting. 04/10/15  Yes Kristen N Ward, DO  ondansetron (ZOFRAN ODT) 4 MG disintegrating tablet Take 1 tablet (4 mg total) by mouth every 8 (eight) hours as needed for nausea or vomiting. 04/12/15   Debby Freiberg, MD  predniSONE (DELTASONE) 20 MG tablet Take 3 tablets (60 mg total) by mouth daily. 04/12/15   Debby Freiberg, MD   BP 123/72 mmHg  Pulse 76  Temp(Src) 98.5 F (36.9 C) (Oral)  Resp 18  SpO2 100%  LMP 04/01/2015 (Approximate) Physical Exam  Constitutional: She is oriented to person, place, and time. She appears well-developed and well-nourished.  HENT:  Head: Normocephalic and atraumatic.  Right Ear: External ear normal.  Left Ear: External ear normal.  Eyes: Conjunctivae and EOM are normal. Pupils are equal, round, and reactive to light.  Neck: Normal range of motion. Neck supple.  Cardiovascular: Normal rate, regular rhythm, normal heart sounds and intact distal pulses.   Pulmonary/Chest: Effort normal and breath sounds normal.  Abdominal: Soft. Bowel sounds are normal. There is generalized tenderness (  mild).  Musculoskeletal: Normal range of motion.  Neurological: She is alert and oriented to person, place, and time.  Skin: Skin is warm and dry.  Vitals reviewed.   ED Course  Procedures (including critical care time) Labs Review Labs Reviewed  COMPREHENSIVE METABOLIC PANEL - Abnormal; Notable for the following:    Potassium 3.2 (*)    Glucose, Bld 116 (*)    Calcium 8.7 (*)    ALT 13 (*)    All other components within normal limits   LIPASE, BLOOD - Abnormal; Notable for the following:    Lipase 18 (*)    All other components within normal limits  CBC WITH DIFFERENTIAL/PLATELET - Abnormal; Notable for the following:    WBC 16.5 (*)    RBC 5.68 (*)    Hemoglobin 15.1 (*)    Neutrophils Relative % 89 (*)    Neutro Abs 14.6 (*)    Lymphocytes Relative 7 (*)    All other components within normal limits  URINALYSIS, ROUTINE W REFLEX MICROSCOPIC - Abnormal; Notable for the following:    APPearance CLOUDY (*)    Hgb urine dipstick MODERATE (*)    Ketones, ur >80 (*)    Protein, ur 30 (*)    All other components within normal limits  URINE MICROSCOPIC-ADD ON - Abnormal; Notable for the following:    Squamous Epithelial / LPF MANY (*)    Bacteria, UA MANY (*)    All other components within normal limits  POC URINE PREG, ED    Imaging Review No results found.   EKG Interpretation None      MDM   Final diagnoses:  Crohn's colitis, without complications  Abdominal pain, unspecified abdominal location    29 y.o. female with pertinent PMH of crohn's presents with recurrent abd pain, with nausea and vomiting.  She was seen recently for identical symptoms. Sent home and had very temporary relief of symptoms. On arrival vital signs and physical exam as above. Patient was given 1 round of Dilaudid with immediate and total relief of symptoms. Workup unremarkable. Discussed judicious use of CT scan with patient and how we will defer at this time. Given that she is afebrile, tolerated by mouth intake here, consider discharge reasonable with GI and PCP follow-up. Discharged in stable condition.    I have reviewed all laboratory and imaging studies if ordered as above  1. Crohn's colitis, without complications   2. Abdominal pain, unspecified abdominal location         Debby Freiberg, MD 04/12/15 0600

## 2015-04-11 NOTE — ED Notes (Signed)
Pt states she was seen here on Tuesday for vomiting and was sent home  Pt states even with the medication she was given she has been nauseated and vomiting today  Mother states pt had a syncopal episode earlier from where she is so weak from all the vomiting   Pt is c/o abd pain she describes as sharp and burning

## 2015-04-12 LAB — CBC WITH DIFFERENTIAL/PLATELET
BASOS PCT: 0 % (ref 0–1)
Basophils Absolute: 0 10*3/uL (ref 0.0–0.1)
EOS ABS: 0 10*3/uL (ref 0.0–0.7)
EOS PCT: 0 % (ref 0–5)
HEMATOCRIT: 44.4 % (ref 36.0–46.0)
Hemoglobin: 15.1 g/dL — ABNORMAL HIGH (ref 12.0–15.0)
LYMPHS PCT: 7 % — AB (ref 12–46)
Lymphs Abs: 1.2 10*3/uL (ref 0.7–4.0)
MCH: 26.6 pg (ref 26.0–34.0)
MCHC: 34 g/dL (ref 30.0–36.0)
MCV: 78.2 fL (ref 78.0–100.0)
MONO ABS: 0.7 10*3/uL (ref 0.1–1.0)
Monocytes Relative: 4 % (ref 3–12)
Neutro Abs: 14.6 10*3/uL — ABNORMAL HIGH (ref 1.7–7.7)
Neutrophils Relative %: 89 % — ABNORMAL HIGH (ref 43–77)
PLATELETS: 327 10*3/uL (ref 150–400)
RBC: 5.68 MIL/uL — ABNORMAL HIGH (ref 3.87–5.11)
RDW: 14.7 % (ref 11.5–15.5)
WBC: 16.5 10*3/uL — ABNORMAL HIGH (ref 4.0–10.5)

## 2015-04-12 LAB — COMPREHENSIVE METABOLIC PANEL
ALBUMIN: 4.1 g/dL (ref 3.5–5.0)
ALT: 13 U/L — ABNORMAL LOW (ref 14–54)
ANION GAP: 8 (ref 5–15)
AST: 20 U/L (ref 15–41)
Alkaline Phosphatase: 65 U/L (ref 38–126)
BILIRUBIN TOTAL: 0.5 mg/dL (ref 0.3–1.2)
BUN: 10 mg/dL (ref 6–20)
CHLORIDE: 103 mmol/L (ref 101–111)
CO2: 24 mmol/L (ref 22–32)
CREATININE: 0.75 mg/dL (ref 0.44–1.00)
Calcium: 8.7 mg/dL — ABNORMAL LOW (ref 8.9–10.3)
GFR calc non Af Amer: >60 mL/min (ref >60)
GLUCOSE: 116 mg/dL — AB (ref 65–99)
Potassium: 3.2 mmol/L — ABNORMAL LOW (ref 3.5–5.1)
Sodium: 135 mmol/L (ref 135–145)
TOTAL PROTEIN: 8.1 g/dL (ref 6.5–8.1)

## 2015-04-12 LAB — URINE MICROSCOPIC-ADD ON

## 2015-04-12 LAB — URINALYSIS, ROUTINE W REFLEX MICROSCOPIC
Bilirubin Urine: NEGATIVE
Glucose, UA: NEGATIVE mg/dL
Ketones, ur: >80 mg/dL — AB
LEUKOCYTES UA: NEGATIVE
NITRITE: NEGATIVE
Protein, ur: 30 mg/dL — AB
Specific Gravity, Urine: 1.021 (ref 1.005–1.030)
Urobilinogen, UA: 0.2 mg/dL (ref 0.0–1.0)
pH: 6 (ref 5.0–8.0)

## 2015-04-12 LAB — LIPASE, BLOOD: LIPASE: 18 U/L — AB (ref 22–51)

## 2015-04-12 LAB — POC URINE PREG, ED: PREG TEST UR: NEGATIVE

## 2015-04-12 MED ORDER — ONDANSETRON HCL 4 MG/2ML IJ SOLN
4.0000 mg | Freq: Once | INTRAMUSCULAR | Status: AC
  Administered 2015-04-12: 4 mg via INTRAVENOUS
  Filled 2015-04-12: qty 2

## 2015-04-12 MED ORDER — METOCLOPRAMIDE HCL 5 MG/ML IJ SOLN
10.0000 mg | Freq: Once | INTRAMUSCULAR | Status: AC
  Administered 2015-04-12: 10 mg via INTRAVENOUS
  Filled 2015-04-12: qty 2

## 2015-04-12 MED ORDER — HYDROMORPHONE HCL 2 MG/ML IJ SOLN: 2.0000 mg | Freq: Once | INTRAMUSCULAR | Status: DC

## 2015-04-12 MED ORDER — SODIUM CHLORIDE 0.9 % IV BOLUS (SEPSIS)
1000.0000 mL | Freq: Once | INTRAVENOUS | Status: AC
  Administered 2015-04-12: 1000 mL via INTRAVENOUS

## 2015-04-12 MED ORDER — PREDNISONE 20 MG PO TABS: 60.0000 mg | ORAL_TABLET | Freq: Every day | ORAL | Status: DC

## 2015-04-12 MED ORDER — HYDROMORPHONE HCL 1 MG/ML IJ SOLN
1.0000 mg | Freq: Once | INTRAMUSCULAR | Status: AC
  Administered 2015-04-12: 1 mg via INTRAVENOUS
  Filled 2015-04-12: qty 1

## 2015-04-12 MED ORDER — ONDANSETRON 4 MG PO TBDP: 4.0000 mg | ORAL_TABLET | Freq: Three times a day (TID) | ORAL | Status: DC | PRN

## 2015-04-12 NOTE — Discharge Instructions (Signed)
Abdominal Pain Many things can cause abdominal pain. Usually, abdominal pain is not caused by a disease and will improve without treatment. It can often be observed and treated at home. Your health care provider will do a physical exam and possibly order blood tests and X-rays to help determine the seriousness of your pain. However, in many cases, more time must pass before a clear cause of the pain can be found. Before that point, your health care provider may not know if you need more testing or further treatment. HOME CARE INSTRUCTIONS  Monitor your abdominal pain for any changes. The following actions may help to alleviate any discomfort you are experiencing:  Only take over-the-counter or prescription medicines as directed by your health care provider.  Do not take laxatives unless directed to do so by your health care provider.  Try a clear liquid diet (broth, tea, or water) as directed by your health care provider. Slowly move to a bland diet as tolerated. SEEK MEDICAL CARE IF:  You have unexplained abdominal pain.  You have abdominal pain associated with nausea or diarrhea.  You have pain when you urinate or have a bowel movement.  You experience abdominal pain that wakes you in the night.  You have abdominal pain that is worsened or improved by eating food.  You have abdominal pain that is worsened with eating fatty foods.  You have a fever. SEEK IMMEDIATE MEDICAL CARE IF:   Your pain does not go away within 2 hours.  You keep throwing up (vomiting).  Your pain is felt only in portions of the abdomen, such as the right side or the left lower portion of the abdomen.  You pass bloody or black tarry stools. MAKE SURE YOU:  Understand these instructions.   Will watch your condition.   Will get help right away if you are not doing well or get worse.  Document Released: 08/27/2005 Document Revised: 11/22/2013 Document Reviewed: 07/27/2013 [facility] Patient Information  2015 [place], [place]. This information is not intended to replace advice given to you by your health care provider. Make sure you discuss any questions you have with your health care provider. Crohn Disease Crohn disease is a long-term (chronic) soreness and redness (inflammation) of the intestines (bowel). It can affect any portion of the digestive tract, from the mouth to the anus. It can also cause problems outside the digestive tract. Crohn disease is closely related to a disease called ulcerative colitis (together, these two diseases are called inflammatory bowel disease).  CAUSES  The cause of Crohn disease is not known. One Link Snuffer is that, in an easily affected person, the immune system is triggered to attack the body's own digestive tissue. Crohn disease runs in families. It seems to be more common in certain geographic areas and amongst certain races. There are no clear-cut dietary causes.  SYMPTOMS  Crohn disease can cause many different symptoms since it can affect many different parts of the body. Symptoms include:  Fatigue.  Weight loss.  Chronic diarrhea, sometime bloody.  Abdominal pain and cramps.  Fever.  Ulcers or canker sores in the mouth or rectum.  Anemia (low red blood cells).  Arthritis, skin problems, and eye problems may occur. Complications of Crohn disease can include:  Series of holes (perforation) of the bowel.  Portions of the intestines sticking to each other (adhesions).  Obstruction of the bowel.  Fistula formation, typically in the rectal area but also in other areas. A fistula is an opening between the  bowels and the outside, or between the bowels and another organ.  A painful crack in the mucous membrane of the anus (rectal fissure). DIAGNOSIS  Your caregiver may suspect Crohn disease based on your symptoms and an exam. Blood tests may confirm that there is a problem. You may be asked to submit a stool specimen for examination. X-rays and CT scans  may be necessary. Ultimately, the diagnosis is usually made after a procedure that uses a flexible tube that is inserted via your mouth or your anus. This is done under sedation and is called either an upper endoscopy or colonoscopy. With these tests, the specialist can take tiny tissue samples and remove them from the inside of the bowel (biopsy). Examination of this biopsy tissue under a microscope can reveal Crohn disease as the cause of your symptoms. Due to the many different forms that Crohn disease can take, symptoms may be present for several years before a diagnosis is made. TREATMENT  Medications are often used to decrease inflammation and control the immune system. These include medicines related to aspirin, steroid medications, and newer and stronger medications to slow down the immune system. Some medications may be used as suppositories or enemas. A number of other medications are used or have been studied. Your caregiver will make specific recommendations. HOME CARE INSTRUCTIONS   Symptoms such as diarrhea can be controlled with medications. Avoid foods that have a laxative effect such as fresh fruit, vegetables, and dairy products. During flare-ups, you can rest your bowel by refraining from solid foods. Drink clear liquids frequently during the day. (Electrolyte or rehydrating fluids are best. Your caregiver can help you with suggestions.) Drink often to prevent loss of body fluids (dehydration). When diarrhea has cleared, eat small meals and more frequently. Avoid food additives and stimulants such as caffeine (coffee, tea, or chocolate). Enzyme supplements may help if you develop intolerance to a sugar in dairy products (lactose). Ask your caregiver or dietitian about specific dietary instructions.  Try to maintain a positive attitude. Learn relaxation techniques such as self-hypnosis, mental imaging, and muscle relaxation.  If possible, avoid stresses which can aggravate your  condition.  Exercise regularly.  Follow your diet.  Always get plenty of rest. SEEK MEDICAL CARE IF:   Your symptoms fail to improve after a week or two of new treatment.  You experience continued weight loss.  You have ongoing cramps or loose bowels.  You develop a new skin rash, skin sores, or eye problems. SEEK IMMEDIATE MEDICAL CARE IF:   You have worsening of your symptoms or develop new symptoms.  You have a fever.  You develop bloody diarrhea.  You develop severe abdominal pain. MAKE SURE YOU:   Understand these instructions.  Will watch your condition.  Will get help right away if you are not doing well or get worse. Document Released: 08/27/2005 Document Revised: 04/03/2014 Document Reviewed: 07/26/2007 [facility] Patient Information 2015 [place], [place]. This information is not intended to replace advice given to you by your health care provider. Make sure you discuss any questions you have with your health care provider.

## 2015-04-12 NOTE — ED Notes (Signed)
Pt tolerated ginger ale.  

## 2015-04-13 ENCOUNTER — Emergency Department (HOSPITAL_COMMUNITY)
Admission: EM | Admit: 2015-04-13 | Discharge: 2015-04-13 | Disposition: A | Discharge status: Home / Self Care | Payer: No Typology Code available for payment source | Attending: Emergency Medicine | Admitting: Emergency Medicine

## 2015-04-13 ENCOUNTER — Encounter (HOSPITAL_COMMUNITY): Payer: Self-pay | Admitting: Emergency Medicine

## 2015-04-13 ENCOUNTER — Emergency Department (HOSPITAL_COMMUNITY): Payer: No Typology Code available for payment source

## 2015-04-13 DIAGNOSIS — R1010 Upper abdominal pain, unspecified: Secondary | ICD-10-CM | POA: Diagnosis present

## 2015-04-13 DIAGNOSIS — Z79899 Other long term (current) drug therapy: Secondary | ICD-10-CM | POA: Diagnosis not present

## 2015-04-13 DIAGNOSIS — Z3202 Encounter for pregnancy test, result negative: Secondary | ICD-10-CM | POA: Diagnosis not present

## 2015-04-13 DIAGNOSIS — Z792 Long term (current) use of antibiotics: Secondary | ICD-10-CM | POA: Diagnosis not present

## 2015-04-13 DIAGNOSIS — K5010 Crohn's disease of large intestine without complications: Secondary | ICD-10-CM | POA: Diagnosis not present

## 2015-04-13 LAB — COMPREHENSIVE METABOLIC PANEL
ALBUMIN: 4.7 g/dL (ref 3.5–5.0)
ALT: 17 U/L (ref 14–54)
AST: 26 U/L (ref 15–41)
Alkaline Phosphatase: 65 U/L (ref 38–126)
Anion gap: 16 — ABNORMAL HIGH (ref 5–15)
BUN: 12 mg/dL (ref 6–20)
CALCIUM: 9.6 mg/dL (ref 8.9–10.3)
CHLORIDE: 99 mmol/L — AB (ref 101–111)
CO2: 24 mmol/L (ref 22–32)
Creatinine, Ser: 0.81 mg/dL (ref 0.44–1.00)
GFR calc Af Amer: >60 mL/min (ref >60)
GFR calc non Af Amer: >60 mL/min (ref >60)
Glucose, Bld: 113 mg/dL — ABNORMAL HIGH (ref 65–99)
Potassium: 3 mmol/L — ABNORMAL LOW (ref 3.5–5.1)
Sodium: 139 mmol/L (ref 135–145)
Total Bilirubin: 1 mg/dL (ref 0.3–1.2)
Total Protein: 9 g/dL — ABNORMAL HIGH (ref 6.5–8.1)

## 2015-04-13 LAB — CBC WITH DIFFERENTIAL/PLATELET
BASOS ABS: 0 10*3/uL (ref 0.0–0.1)
Basophils Relative: 0 % (ref 0–1)
EOS ABS: 0 10*3/uL (ref 0.0–0.7)
Eosinophils Relative: 0 % (ref 0–5)
HCT: 48 % — ABNORMAL HIGH (ref 36.0–46.0)
HEMOGLOBIN: 16.5 g/dL — AB (ref 12.0–15.0)
Lymphocytes Relative: 10 % — ABNORMAL LOW (ref 12–46)
Lymphs Abs: 1.5 10*3/uL (ref 0.7–4.0)
MCH: 26.6 pg (ref 26.0–34.0)
MCHC: 34.4 g/dL (ref 30.0–36.0)
MCV: 77.4 fL — ABNORMAL LOW (ref 78.0–100.0)
Monocytes Absolute: 1.1 10*3/uL — ABNORMAL HIGH (ref 0.1–1.0)
Monocytes Relative: 7 % (ref 3–12)
Neutro Abs: 13.5 10*3/uL — ABNORMAL HIGH (ref 1.7–7.7)
Neutrophils Relative %: 83 % — ABNORMAL HIGH (ref 43–77)
PLATELETS: 316 10*3/uL (ref 150–400)
RBC: 6.2 MIL/uL — ABNORMAL HIGH (ref 3.87–5.11)
RDW: 14.6 % (ref 11.5–15.5)
WBC: 16.2 10*3/uL — ABNORMAL HIGH (ref 4.0–10.5)

## 2015-04-13 LAB — URINE MICROSCOPIC-ADD ON

## 2015-04-13 LAB — I-STAT CG4 LACTIC ACID, ED
LACTIC ACID, VENOUS: 0.99 mmol/L (ref 0.5–2.0)
Lactic Acid, Venous: 0.77 mmol/L (ref 0.5–2.0)

## 2015-04-13 LAB — LIPASE, BLOOD: Lipase: 22 U/L (ref 22–51)

## 2015-04-13 LAB — URINALYSIS, ROUTINE W REFLEX MICROSCOPIC
Bilirubin Urine: NEGATIVE
GLUCOSE, UA: NEGATIVE mg/dL
LEUKOCYTES UA: NEGATIVE
NITRITE: NEGATIVE
PH: 7 (ref 5.0–8.0)
Protein, ur: NEGATIVE mg/dL
Specific Gravity, Urine: 1.015 (ref 1.005–1.030)
Urobilinogen, UA: 0.2 mg/dL (ref 0.0–1.0)

## 2015-04-13 LAB — PREGNANCY, URINE: PREG TEST UR: NEGATIVE

## 2015-04-13 MED ORDER — MORPHINE SULFATE 4 MG/ML IJ SOLN
4.0000 mg | Freq: Once | INTRAMUSCULAR | Status: AC
  Administered 2015-04-13: 4 mg via INTRAVENOUS
  Filled 2015-04-13: qty 1

## 2015-04-13 MED ORDER — ONDANSETRON HCL 4 MG/2ML IJ SOLN
4.0000 mg | Freq: Once | INTRAMUSCULAR | Status: AC
  Administered 2015-04-13: 4 mg via INTRAVENOUS
  Filled 2015-04-13: qty 2

## 2015-04-13 MED ORDER — SODIUM CHLORIDE 0.9 % IV BOLUS (SEPSIS)
1000.0000 mL | Freq: Once | INTRAVENOUS | Status: AC
  Administered 2015-04-13: 1000 mL via INTRAVENOUS

## 2015-04-13 MED ORDER — HYDROMORPHONE HCL 1 MG/ML IJ SOLN
1.0000 mg | Freq: Once | INTRAMUSCULAR | Status: AC
  Administered 2015-04-13: 1 mg via INTRAVENOUS
  Filled 2015-04-13: qty 1

## 2015-04-13 MED ORDER — DEXAMETHASONE SODIUM PHOSPHATE 10 MG/ML IJ SOLN
10.0000 mg | Freq: Once | INTRAMUSCULAR | Status: AC
  Administered 2015-04-13: 10 mg via INTRAVENOUS
  Filled 2015-04-13: qty 1

## 2015-04-13 MED ORDER — IOHEXOL 300 MG/ML  SOLN
100.0000 mL | Freq: Once | INTRAMUSCULAR | Status: AC | PRN
  Administered 2015-04-13: 100 mL via INTRAVENOUS

## 2015-04-13 MED ORDER — PROMETHAZINE HCL 25 MG PO TABS: 25.0000 mg | ORAL_TABLET | Freq: Four times a day (QID) | ORAL | Status: DC | PRN

## 2015-04-13 MED ORDER — PROMETHAZINE HCL 25 MG/ML IJ SOLN
25.0000 mg | Freq: Once | INTRAMUSCULAR | Status: AC
  Administered 2015-04-13: 25 mg via INTRAVENOUS
  Filled 2015-04-13: qty 1

## 2015-04-13 MED ORDER — IOHEXOL 300 MG/ML  SOLN
25.0000 mL | Freq: Once | INTRAMUSCULAR | Status: AC | PRN
Start: 2015-04-13 — End: 2015-04-13
  Administered 2015-04-13: 25 mL via ORAL

## 2015-04-13 MED ORDER — OXYCODONE-ACETAMINOPHEN 5-325 MG PO TABS: 2.0000 | ORAL_TABLET | ORAL | Status: DC | PRN

## 2015-04-13 MED ORDER — POTASSIUM CHLORIDE 10 MEQ/100ML IV SOLN
10.0000 meq | INTRAVENOUS | Status: AC
  Administered 2015-04-13 (×2): 10 meq via INTRAVENOUS
  Filled 2015-04-13 (×2): qty 100

## 2015-04-13 NOTE — ED Notes (Signed)
Patient transported to CT 

## 2015-04-13 NOTE — ED Provider Notes (Signed)
CSN: [id number]     Arrival date & time 04/13/15  1359 History   First MD Initiated Contact with Patient 04/13/15 1603     Chief Complaint  Patient presents with  . Emesis    x3 days, seen here in ED x2 with no relief of symptoms  . Abdominal Pain    x3 days     (Consider location/radiation/quality/duration/timing/severity/associated sxs/prior Treatment) HPI Comments: Patient is a 29 year old female with a past medical history of Crohn's disease who presents with abdominal pain that started 1 week ago. Symptoms started gradually and progressively worsened since the onset. The pain is cramping and severe and located in her upper abdomen without radiation. She reports nausea and vomiting. No diarrhea or constipation. No fevers or other associated symptoms. This is the patient's 3rd visit this week for the same symptoms. Patient was given steroids and phenergan suppositories yesterday but she reports having worsening symptoms today. No aggravating/alleviating factors.    Past Medical History  Diagnosis Date  . Crohn's disease   . Non-compliant patient    Past Surgical History  Procedure Laterality Date  . Induced abortion     No family history on file. History  Substance Use Topics  . Smoking status: Never Smoker   . Smokeless tobacco: Not on file  . Alcohol Use: Yes     Comment: ocassionally   OB History    No data available     Review of Systems  Constitutional: Negative for fever, chills and fatigue.  HENT: Negative for trouble swallowing.   Eyes: Negative for visual disturbance.  Respiratory: Negative for shortness of breath.   Cardiovascular: Negative for chest pain and palpitations.  Gastrointestinal: Positive for nausea, vomiting and abdominal pain. Negative for diarrhea.  Genitourinary: Negative for dysuria and difficulty urinating.  Musculoskeletal: Negative for arthralgias and neck pain.  Skin: Negative for color change.  Neurological: Negative for dizziness and  weakness.  Psychiatric/Behavioral: Negative for dysphoric mood.      Allergies  Bee venom and Ciprofloxacin  Home Medications   Prior to Admission medications   Medication Sig Start Date End Date Taking? Authorizing Provider  dicyclomine (BENTYL) 20 MG tablet Take 1 tablet (20 mg total) by mouth 2 (two) times daily. As needed for pain 04/10/15  Yes Kristen N Ward, DO  ibuprofen (ADVIL,MOTRIN) 200 MG tablet Take 400 mg by mouth every 6 (six) hours as needed for mild pain.   Yes Historical Provider, MD  ondansetron (ZOFRAN ODT) 4 MG disintegrating tablet Take 1 tablet (4 mg total) by mouth every 8 (eight) hours as needed for nausea or vomiting. 04/12/15  Yes Debby Freiberg, MD  Phenyleph-CPM-DM-APAP (TYLENOL COLD MULTI-SYMPTOM PO) Take 10 mLs by mouth every 6 (six) hours as needed (cough).   Yes Historical Provider, MD  predniSONE (DELTASONE) 20 MG tablet Take 3 tablets (60 mg total) by mouth daily. 04/12/15  Yes Debby Freiberg, MD  promethazine (PHENERGAN) 25 MG suppository Place 1 suppository (25 mg total) rectally every 6 (six) hours as needed for nausea or vomiting. 04/10/15  Yes Kristen N Ward, DO  promethazine (PHENERGAN) 25 MG tablet Take 1 tablet (25 mg total) by mouth every 6 (six) hours as needed for nausea or vomiting. 04/10/15  Yes Kristen N Ward, DO   BP 172/118 mmHg  Pulse 85  Temp(Src) 98.9 F (37.2 C) (Oral)  Resp 19  Ht 5' (1.524 m)  Wt 177 lb (80.287 kg)  BMI 34.57 kg/m2  SpO2 99%  LMP 04/01/2015 (  Approximate) Physical Exam  Constitutional: She is oriented to person, place, and time. She appears well-developed and well-nourished. No distress.  HENT:  Head: Normocephalic and atraumatic.  Eyes: Conjunctivae and EOM are normal.  Neck: Normal range of motion. Neck supple.  Cardiovascular: Normal rate and regular rhythm.  Exam reveals no gallop and no friction rub.   No murmur heard. Pulmonary/Chest: Effort normal and breath sounds normal. She has no wheezes. She has no  rales. She exhibits no tenderness.  Abdominal: Soft. She exhibits no distension. There is tenderness. There is no rebound.  Upper abdominal tenderness to palpation without focal tenderness. No peritoneal signs.   Musculoskeletal: Normal range of motion.  Neurological: She is alert and oriented to person, place, and time. Coordination normal.  Speech is goal-oriented. Moves limbs without ataxia.   Skin: Skin is warm and dry.  Psychiatric: She has a normal mood and affect. Her behavior is normal.  Nursing note and vitals reviewed.   ED Course  Procedures (including critical care time) Labs Review Labs Reviewed  CBC WITH DIFFERENTIAL/PLATELET - Abnormal; Notable for the following:    WBC 16.2 (*)    RBC 6.20 (*)    Hemoglobin 16.5 (*)    HCT 48.0 (*)    MCV 77.4 (*)    Neutrophils Relative % 83 (*)    Neutro Abs 13.5 (*)    Lymphocytes Relative 10 (*)    Monocytes Absolute 1.1 (*)    All other components within normal limits  COMPREHENSIVE METABOLIC PANEL - Abnormal; Notable for the following:    Potassium 3.0 (*)    Chloride 99 (*)    Glucose, Bld 113 (*)    Total Protein 9.0 (*)    Anion gap 16 (*)    All other components within normal limits  URINALYSIS, ROUTINE W REFLEX MICROSCOPIC - Abnormal; Notable for the following:    Hgb urine dipstick SMALL (*)    Ketones, ur >80 (*)    All other components within normal limits  LIPASE, BLOOD  PREGNANCY, URINE  URINE MICROSCOPIC-ADD ON  I-STAT CG4 LACTIC ACID, ED  I-STAT CG4 LACTIC ACID, ED    Imaging Review Ct Abdomen Pelvis W Contrast  04/13/2015   CLINICAL DATA:  History of Crohn's disease. The patient complains of Crohn's flare up in the past 2 days.  EXAM: CT ABDOMEN AND PELVIS WITH CONTRAST  TECHNIQUE: Multidetector CT imaging of the abdomen and pelvis was performed using the standard protocol following bolus administration of intravenous contrast.  CONTRAST:  148mL OMNIPAQUE IOHEXOL 300 MG/ML  SOLN  COMPARISON:  January 06, 2015  FINDINGS: There is focal fatty infiltration of liver near falciform ligament. The liver is otherwise normal. The spleen, pancreas, gallbladder, adrenal glands and kidneys are normal. The aorta is normal without aneurysmal dilatation. There is no abdominal lymphadenopathy.  Images of the bowel demonstrate thickened bowel wall of the right transverse colon without significant adjacent inflammation. The appendix is normal. The small bowel is normal without evidence of obstruction.  Fluid-filled bladder is normal. The uterus is normal. There is a 2.6 x 3.1 cm cyst in the right ovary. The lung bases are clear. The visualized bones demonstrate no acute abnormality.  IMPRESSION: Thickened bowel wall of the right transverse colon without significant adjacent inflammation. The finding could be due to acute changes of Crohn's disease.   Electronically Signed   By: Abelardo Diesel M.D.   On: 04/13/2015 19:52     EKG Interpretation None  MDM   Final diagnoses:  Crohn's colitis, without complications    7:21 PM Labs show elevated WBC at 16.2 likely due to vomiting and dehydration. Potassium low at 3.0 likely due to vomiting. Patient will have IV potassium. CT abdomen pelvis pending. Vitals stable and patient afebrile.   CT shows Crohns flare. Patient feeling better and given IV decadron. Patient will be discharged with pain and nausea medication and recommended follow up with PCP.   Alvina Chou, PA-C 04/14/15 [id number]  Evelina Bucy, MD 04/15/15 [phone]

## 2015-04-13 NOTE — ED Notes (Addendum)
Initial Contact - pt A+Ox4, reports c/o crohn's flare, here two times in the past two days for same, given steroids, promethazine suppositories and sts no relief or change in symptoms since.  Pt c/o 10/10 diffuse abd pain and "constant vomiting", no active vomiting during triage but pt is gagging and making herself spit.  Pt denies diarrhea, constipation, fevers/chills.   Skin PWD.  MAEI.  Speaking full/clear sentences, rr even/un-lab.  NAD.

## 2015-04-13 NOTE — Discharge Instructions (Signed)
Take Percocet as needed for pain. Take phenergan for nausea. Refer to attached documents for more information.

## 2015-05-24 ENCOUNTER — Emergency Department (HOSPITAL_COMMUNITY)
Admission: EM | Admit: 2015-05-24 | Discharge: 2015-05-24 | Disposition: A | Discharge status: Home / Self Care | Payer: No Typology Code available for payment source | Attending: Emergency Medicine | Admitting: Emergency Medicine

## 2015-05-24 ENCOUNTER — Encounter (HOSPITAL_COMMUNITY): Payer: Self-pay | Admitting: *Deleted

## 2015-05-24 DIAGNOSIS — R112 Nausea with vomiting, unspecified: Secondary | ICD-10-CM | POA: Insufficient documentation

## 2015-05-24 DIAGNOSIS — E669 Obesity, unspecified: Secondary | ICD-10-CM | POA: Insufficient documentation

## 2015-05-24 DIAGNOSIS — Z7952 Long term (current) use of systemic steroids: Secondary | ICD-10-CM | POA: Diagnosis not present

## 2015-05-24 DIAGNOSIS — Z9119 Patient's noncompliance with other medical treatment and regimen: Secondary | ICD-10-CM | POA: Diagnosis not present

## 2015-05-24 DIAGNOSIS — R1032 Left lower quadrant pain: Secondary | ICD-10-CM | POA: Diagnosis not present

## 2015-05-24 DIAGNOSIS — Z8719 Personal history of other diseases of the digestive system: Secondary | ICD-10-CM | POA: Diagnosis not present

## 2015-05-24 DIAGNOSIS — Z3202 Encounter for pregnancy test, result negative: Secondary | ICD-10-CM | POA: Insufficient documentation

## 2015-05-24 LAB — CBC WITH DIFFERENTIAL/PLATELET
BASOS ABS: 0 10*3/uL (ref 0.0–0.1)
Basophils Relative: 0 % (ref 0–1)
EOS ABS: 0 10*3/uL (ref 0.0–0.7)
EOS PCT: 0 % (ref 0–5)
HCT: 43 % (ref 36.0–46.0)
HEMOGLOBIN: 14.4 g/dL (ref 12.0–15.0)
LYMPHS ABS: 0.7 10*3/uL (ref 0.7–4.0)
LYMPHS PCT: 5 % — AB (ref 12–46)
MCH: 26.4 pg (ref 26.0–34.0)
MCHC: 33.5 g/dL (ref 30.0–36.0)
MCV: 78.8 fL (ref 78.0–100.0)
Monocytes Absolute: 0.3 10*3/uL (ref 0.1–1.0)
Monocytes Relative: 2 % — ABNORMAL LOW (ref 3–12)
NEUTROS PCT: 93 % — AB (ref 43–77)
Neutro Abs: 15 10*3/uL — ABNORMAL HIGH (ref 1.7–7.7)
Platelets: 312 10*3/uL (ref 150–400)
RBC: 5.46 MIL/uL — ABNORMAL HIGH (ref 3.87–5.11)
RDW: 14.9 % (ref 11.5–15.5)
WBC: 16 10*3/uL — AB (ref 4.0–10.5)

## 2015-05-24 LAB — COMPREHENSIVE METABOLIC PANEL
ALBUMIN: 4.2 g/dL (ref 3.5–5.0)
ALT: 11 U/L — ABNORMAL LOW (ref 14–54)
ANION GAP: 11 (ref 5–15)
AST: 25 U/L (ref 15–41)
Alkaline Phosphatase: 74 U/L (ref 38–126)
BILIRUBIN TOTAL: 0.6 mg/dL (ref 0.3–1.2)
BUN: 6 mg/dL (ref 6–20)
CALCIUM: 9 mg/dL (ref 8.9–10.3)
CO2: 25 mmol/L (ref 22–32)
CREATININE: 0.8 mg/dL (ref 0.44–1.00)
Chloride: 98 mmol/L — ABNORMAL LOW (ref 101–111)
GFR calc non Af Amer: >60 mL/min (ref >60)
Glucose, Bld: 131 mg/dL — ABNORMAL HIGH (ref 65–99)
Potassium: 3.6 mmol/L (ref 3.5–5.1)
SODIUM: 134 mmol/L — AB (ref 135–145)
Total Protein: 8.3 g/dL — ABNORMAL HIGH (ref 6.5–8.1)

## 2015-05-24 LAB — POC URINE PREG, ED: Preg Test, Ur: NEGATIVE

## 2015-05-24 LAB — URINALYSIS, ROUTINE W REFLEX MICROSCOPIC
Bilirubin Urine: NEGATIVE
Glucose, UA: NEGATIVE mg/dL
Ketones, ur: >80 mg/dL — AB
LEUKOCYTES UA: NEGATIVE
NITRITE: NEGATIVE
Protein, ur: 100 mg/dL — AB
SPECIFIC GRAVITY, URINE: 1.019 (ref 1.005–1.030)
Urobilinogen, UA: 0.2 mg/dL (ref 0.0–1.0)
pH: 8.5 — ABNORMAL HIGH (ref 5.0–8.0)

## 2015-05-24 LAB — URINE MICROSCOPIC-ADD ON

## 2015-05-24 LAB — LIPASE, BLOOD: Lipase: 17 U/L — ABNORMAL LOW (ref 22–51)

## 2015-05-24 LAB — POC OCCULT BLOOD, ED: Fecal Occult Bld: NEGATIVE

## 2015-05-24 MED ORDER — SODIUM CHLORIDE 0.9 % IV BOLUS (SEPSIS)
1000.0000 mL | Freq: Once | INTRAVENOUS | Status: AC
  Administered 2015-05-24: 1000 mL via INTRAVENOUS

## 2015-05-24 MED ORDER — PROCHLORPERAZINE EDISYLATE 5 MG/ML IJ SOLN
10.0000 mg | Freq: Once | INTRAMUSCULAR | Status: AC
  Administered 2015-05-24: 10 mg via INTRAVENOUS
  Filled 2015-05-24: qty 2

## 2015-05-24 MED ORDER — MORPHINE SULFATE 4 MG/ML IJ SOLN
4.0000 mg | Freq: Once | INTRAMUSCULAR | Status: AC
  Administered 2015-05-24: 4 mg via INTRAVENOUS
  Filled 2015-05-24: qty 1

## 2015-05-24 MED ORDER — PROMETHAZINE HCL 25 MG/ML IJ SOLN
25.0000 mg | Freq: Once | INTRAMUSCULAR | Status: AC
  Administered 2015-05-24: 25 mg via INTRAVENOUS
  Filled 2015-05-24: qty 1

## 2015-05-24 MED ORDER — ONDANSETRON HCL 4 MG/2ML IJ SOLN
4.0000 mg | Freq: Once | INTRAMUSCULAR | Status: AC
  Administered 2015-05-24: 4 mg via INTRAVENOUS
  Filled 2015-05-24: qty 2

## 2015-05-24 MED ORDER — PREDNISONE 20 MG PO TABS: ORAL_TABLET | ORAL | Status: DC

## 2015-05-24 MED ORDER — PROMETHAZINE HCL 25 MG PO TABS: 25.0000 mg | ORAL_TABLET | Freq: Four times a day (QID) | ORAL | Status: DC | PRN

## 2015-05-24 NOTE — ED Notes (Signed)
Attempted to place IV X 2-informed PA

## 2015-05-24 NOTE — Discharge Instructions (Signed)
Please follow-up with your GI specialist for further management of your Crohn's disease. Take steroid medication as prescribed, and use antinausea medication as needed. Return to the ED if condition worsen or if you have any other concern.  Nausea and Vomiting Nausea is a sick feeling that often comes before throwing up (vomiting). Vomiting is a reflex where stomach contents come out of your mouth. Vomiting can cause severe loss of body fluids (dehydration). Children and elderly adults can become dehydrated quickly, especially if they also have diarrhea. Nausea and vomiting are symptoms of a condition or disease. It is important to find the cause of your symptoms. CAUSES   Direct irritation of the stomach lining. This irritation can result from increased acid production (gastroesophageal reflux disease), infection, food poisoning, taking certain medicines (such as nonsteroidal anti-inflammatory drugs), alcohol use, or tobacco use.  Signals from the brain.These signals could be caused by a headache, heat exposure, an inner ear disturbance, increased pressure in the brain from injury, infection, a tumor, or a concussion, pain, emotional stimulus, or metabolic problems.  An obstruction in the gastrointestinal tract (bowel obstruction).  Illnesses such as diabetes, hepatitis, gallbladder problems, appendicitis, kidney problems, cancer, sepsis, atypical symptoms of a heart attack, or eating disorders.  Medical treatments such as chemotherapy and radiation.  Receiving medicine that makes you sleep (general anesthetic) during surgery. DIAGNOSIS Your caregiver may ask for tests to be done if the problems do not improve after a few days. Tests may also be done if symptoms are severe or if the reason for the nausea and vomiting is not clear. Tests may include:  Urine tests.  Blood tests.  Stool tests.  Cultures (to look for evidence of infection).  X-rays or other imaging studies. Test results  can help your caregiver make decisions about treatment or the need for additional tests. TREATMENT You need to stay well hydrated. Drink frequently but in small amounts.You may wish to drink water, sports drinks, clear broth, or eat frozen ice pops or gelatin dessert to help stay hydrated.When you eat, eating slowly may help prevent nausea.There are also some antinausea medicines that may help prevent nausea. HOME CARE INSTRUCTIONS   Take all medicine as directed by your caregiver.  If you do not have an appetite, do not force yourself to eat. However, you must continue to drink fluids.  If you have an appetite, eat a normal diet unless your caregiver tells you differently.  Eat a variety of complex carbohydrates (rice, wheat, potatoes, bread), lean meats, yogurt, fruits, and vegetables.  Avoid high-fat foods because they are more difficult to digest.  Drink enough water and fluids to keep your urine clear or pale yellow.  If you are dehydrated, ask your caregiver for specific rehydration instructions. Signs of dehydration may include:  Severe thirst.  Dry lips and mouth.  Dizziness.  Dark urine.  Decreasing urine frequency and amount.  Confusion.  Rapid breathing or pulse. SEEK IMMEDIATE MEDICAL CARE IF:   You have blood or brown flecks (like coffee grounds) in your vomit.  You have black or bloody stools.  You have a severe headache or stiff neck.  You are confused.  You have severe abdominal pain.  You have chest pain or trouble breathing.  You do not urinate at least once every 8 hours.  You develop cold or clammy skin.  You continue to vomit for longer than 24 to 48 hours.  You have a fever. MAKE SURE YOU:   Understand these instructions.  Will watch your condition.  Will get help right away if you are not doing well or get worse. Document Released: 11/17/2005 Document Revised: 02/09/2012 Document Reviewed: 04/16/2011 [facility] Patient Information  2015 [place], [place]. This information is not intended to replace advice given to you by your health care provider. Make sure you discuss any questions you have with your health care provider.

## 2015-05-24 NOTE — ED Notes (Signed)
PT has eaten 2 pks of crackers and is sipping on beverage.

## 2015-05-24 NOTE — ED Notes (Signed)
EDPA BOWIE PRESENT at bedside. 

## 2015-05-24 NOTE — ED Notes (Signed)
RN established IV, pt Will be medicated, tech will reevaluate after pt becomes comfortable

## 2015-05-24 NOTE — ED Notes (Signed)
RN attempts IV access x 2 not successful.

## 2015-05-24 NOTE — ED Provider Notes (Signed)
CSN: [id number]     Arrival date & time 05/24/15  0800 History   First MD Initiated Contact with Patient 05/24/15 0809     No chief complaint on file.    (Consider location/radiation/quality/duration/timing/severity/associated sxs/prior Treatment) HPI   29 year old female with history of Crohn's disease who is noncompliant with medication presenting for evaluation of abdominal pain. Patient report after dinner last night she subsequently developed nausea, vomiting, and abdominal pain. She has been vomited once an hour throughout the night. Vomitus initially with food particle and subsequently with trace of blood. She also endorsed having pain to her low abdomen towards left lower quadrant, sharp, crampy, 10 out of 10. She has 2 normal bowel movement last night. She tries taking Zofran without adequate relief. She reports eating mac & cheese and they chicken last night. No one else with similar sickness. She was seen for similar abdominal pain, ago which was felt related to her Crohn's disease. Otherwise patient denies fever, chills, headache, chest pain, short of breath, productive cough, back pain, dysuria, vaginal bleeding, rectal bleeding, or rash.   Past Medical History  Diagnosis Date  . Crohn's disease   . Non-compliant patient    Past Surgical History  Procedure Laterality Date  . Induced abortion     No family history on file. History  Substance Use Topics  . Smoking status: Never Smoker   . Smokeless tobacco: Not on file  . Alcohol Use: Yes     Comment: ocassionally   OB History    No data available     Review of Systems  All other systems reviewed and are negative.     Allergies  Bee venom and Ciprofloxacin  Home Medications   Prior to Admission medications   Medication Sig Start Date End Date Taking? Authorizing Provider  dicyclomine (BENTYL) 20 MG tablet Take 1 tablet (20 mg total) by mouth 2 (two) times daily. As needed for pain 04/10/15   Kristen N Ward, DO   ibuprofen (ADVIL,MOTRIN) 200 MG tablet Take 400 mg by mouth every 6 (six) hours as needed for mild pain.    Historical Provider, MD  ondansetron (ZOFRAN ODT) 4 MG disintegrating tablet Take 1 tablet (4 mg total) by mouth every 8 (eight) hours as needed for nausea or vomiting. 04/12/15   Debby Freiberg, MD  oxyCODONE-acetaminophen (PERCOCET/ROXICET) 5-325 MG per tablet Take 2 tablets by mouth every 4 (four) hours as needed for severe pain. 04/13/15   Kaitlyn Szekalski, PA-C  Phenyleph-CPM-DM-APAP (TYLENOL COLD MULTI-SYMPTOM PO) Take 10 mLs by mouth every 6 (six) hours as needed (cough).    Historical Provider, MD  predniSONE (DELTASONE) 20 MG tablet Take 3 tablets (60 mg total) by mouth daily. 04/12/15   Debby Freiberg, MD  promethazine (PHENERGAN) 25 MG tablet Take 1 tablet (25 mg total) by mouth every 6 (six) hours as needed for nausea or vomiting. 04/13/15   Alvina Chou, PA-C   There were no vitals taken for this visit. Physical Exam  Constitutional: She is oriented to person, place, and time. She appears well-developed and well-nourished. No distress.  Obese African-American female Awake, alert, nontoxic appearance  HENT:  Head: Atraumatic.  Mouth/Throat: Oropharynx is clear and moist.  Eyes: Conjunctivae are normal. Right eye exhibits no discharge. Left eye exhibits no discharge.  Neck: Neck supple.  Cardiovascular: Normal rate and regular rhythm.   Pulmonary/Chest: Effort normal. No respiratory distress. She exhibits no tenderness.  Abdominal: Soft. There is tenderness (Tenderness to left lower quadrant on palpation  without guarding or rebound tenderness.). There is no rebound.  Musculoskeletal: She exhibits no tenderness.  Neurological: She is alert and oriented to person, place, and time.  Skin: No rash noted.  Psychiatric: She has a normal mood and affect.  Nursing note and vitals reviewed.   ED Course  Procedures (including critical care time)  8:31 AM Patient with history  of Crohn's disease here with nausea vomiting and abdominal pain. Pain is probably related to her left lower quadrant. Although she complained of 10 out of 10 pain, she does not have any guarding or rebound tenderness. No shortness of breath and no abdominal distention concerning for perforation. She mentioned that she had a recent CT scan last month. At this time, plan to treat symptoms. I am hesitant to order additional imaging giving the risk of recurrent radiation exposure and her age. Patient and family members agrees  10:23 AM Patient reports some improvement after receiving antinausea medication and pain medication. Will monitor closely. Leukocytosis with WBC 16.0 however at baseline as compared to prior values. Electrolytes are reassuring. Pregnancy test negative. Negative fecal occult blood. Urine shows greater than 80 ketones but no signs of infection. We'll continue with IV fluid.  12:39 PM Patient report feeling better after receiving treatment. We'll by mouth challenge. If she can't tolerate by mouth, patient will be discharged with a course of steroid, antinausea medication, and close follow-up with her specialist. Patient voiced understanding and agrees with plan.  1:05 PM Patient able to tolerance by mouth. I feel patient is stable for discharge. Strict return precautions discussed.  Labs Review Labs Reviewed  CBC WITH DIFFERENTIAL/PLATELET - Abnormal; Notable for the following:    WBC 16.0 (*)    RBC 5.46 (*)    Neutrophils Relative % 93 (*)    Neutro Abs 15.0 (*)    Lymphocytes Relative 5 (*)    Monocytes Relative 2 (*)    All other components within normal limits  COMPREHENSIVE METABOLIC PANEL - Abnormal; Notable for the following:    Sodium 134 (*)    Chloride 98 (*)    Glucose, Bld 131 (*)    Total Protein 8.3 (*)    ALT 11 (*)    All other components within normal limits  LIPASE, BLOOD - Abnormal; Notable for the following:    Lipase 17 (*)    All other components  within normal limits  URINALYSIS, ROUTINE W REFLEX MICROSCOPIC (NOT AT [facility]) - Abnormal; Notable for the following:    APPearance CLOUDY (*)    pH 8.5 (*)    Hgb urine dipstick SMALL (*)    Ketones, ur >80 (*)    Protein, ur 100 (*)    All other components within normal limits  URINE MICROSCOPIC-ADD ON - Abnormal; Notable for the following:    Bacteria, UA FEW (*)    All other components within normal limits  OCCULT BLOOD X 1 CARD TO LAB, STOOL  POC URINE PREG, ED  POC OCCULT BLOOD, ED    Imaging Review No results found.   EKG Interpretation None      MDM   Final diagnoses:  Non-intractable vomiting with nausea, vomiting of unspecified type    BP 166/113 mmHg  Pulse 81  Temp(Src) 98.5 F (36.9 C) (Oral)  Resp 15  Ht 5' (1.524 m)  Wt 178 lb (80.74 kg)  BMI 34.76 kg/m2  SpO2 100%     Domenic Moras, PA-C 05/24/15 1308  Debby Freiberg, MD 05/25/15 1030

## 2015-05-24 NOTE — ED Notes (Signed)
RN currently attempting IV access, Tech asked to be made aware if not successful

## 2015-05-24 NOTE — ED Notes (Signed)
Pt reports hx of Crohns, upset stomach started last night, took zofran around 2200 with no relief. Started vomiting around 0015. Vomiting since then. abd pain started this morning. Pain 10/10. Reports diarrhea x2. Denies blood in stool or urine.

## 2015-05-24 NOTE — ED Notes (Addendum)
EDPA BOWIE PRESENT at bedside. 

## 2015-05-24 NOTE — ED Notes (Signed)
Gave pt crackers and beverage.  PA ordered.

## 2015-05-25 ENCOUNTER — Encounter (HOSPITAL_COMMUNITY): Payer: Self-pay | Admitting: Emergency Medicine

## 2015-05-25 ENCOUNTER — Emergency Department (HOSPITAL_COMMUNITY)
Admission: EM | Admit: 2015-05-25 | Discharge: 2015-05-25 | Disposition: A | Discharge status: Home / Self Care | Payer: No Typology Code available for payment source | Attending: Emergency Medicine | Admitting: Emergency Medicine

## 2015-05-25 DIAGNOSIS — R1032 Left lower quadrant pain: Secondary | ICD-10-CM | POA: Insufficient documentation

## 2015-05-25 DIAGNOSIS — Z3202 Encounter for pregnancy test, result negative: Secondary | ICD-10-CM | POA: Diagnosis not present

## 2015-05-25 DIAGNOSIS — R1013 Epigastric pain: Secondary | ICD-10-CM | POA: Diagnosis not present

## 2015-05-25 DIAGNOSIS — R112 Nausea with vomiting, unspecified: Secondary | ICD-10-CM | POA: Diagnosis present

## 2015-05-25 DIAGNOSIS — R42 Dizziness and giddiness: Secondary | ICD-10-CM | POA: Diagnosis not present

## 2015-05-25 LAB — CBC WITH DIFFERENTIAL/PLATELET
Basophils Absolute: 0.1 10*3/uL (ref 0.0–0.1)
Basophils Relative: 0 % (ref 0–1)
Eosinophils Absolute: 0.1 10*3/uL (ref 0.0–0.7)
Eosinophils Relative: 0 % (ref 0–5)
HEMATOCRIT: 47.7 % — AB (ref 36.0–46.0)
HEMOGLOBIN: 16 g/dL — AB (ref 12.0–15.0)
LYMPHS ABS: 1.9 10*3/uL (ref 0.7–4.0)
LYMPHS PCT: 11 % — AB (ref 12–46)
MCH: 26.1 pg (ref 26.0–34.0)
MCHC: 33.5 g/dL (ref 30.0–36.0)
MCV: 77.7 fL — ABNORMAL LOW (ref 78.0–100.0)
MONO ABS: 0.8 10*3/uL (ref 0.1–1.0)
MONOS PCT: 5 % (ref 3–12)
NEUTROS ABS: 14.3 10*3/uL — AB (ref 1.7–7.7)
NEUTROS PCT: 84 % — AB (ref 43–77)
Platelets: 384 10*3/uL (ref 150–400)
RBC: 6.14 MIL/uL — AB (ref 3.87–5.11)
RDW: 15.1 % (ref 11.5–15.5)
WBC: 17.1 10*3/uL — AB (ref 4.0–10.5)

## 2015-05-25 LAB — COMPREHENSIVE METABOLIC PANEL
ALBUMIN: 4.8 g/dL (ref 3.5–5.0)
ALT: 11 U/L — AB (ref 14–54)
AST: 26 U/L (ref 15–41)
Alkaline Phosphatase: 82 U/L (ref 38–126)
Anion gap: 11 (ref 5–15)
BUN: 8 mg/dL (ref 6–20)
CO2: 25 mmol/L (ref 22–32)
CREATININE: 0.87 mg/dL (ref 0.44–1.00)
Calcium: 9.2 mg/dL (ref 8.9–10.3)
Chloride: 101 mmol/L (ref 101–111)
GFR calc Af Amer: >60 mL/min (ref >60)
GFR calc non Af Amer: >60 mL/min (ref >60)
GLUCOSE: 117 mg/dL — AB (ref 65–99)
POTASSIUM: 3.3 mmol/L — AB (ref 3.5–5.1)
Sodium: 137 mmol/L (ref 135–145)
TOTAL PROTEIN: 9.3 g/dL — AB (ref 6.5–8.1)
Total Bilirubin: 0.8 mg/dL (ref 0.3–1.2)

## 2015-05-25 LAB — URINE MICROSCOPIC-ADD ON

## 2015-05-25 LAB — URINALYSIS, ROUTINE W REFLEX MICROSCOPIC
BILIRUBIN URINE: NEGATIVE
Glucose, UA: NEGATIVE mg/dL
Ketones, ur: 15 mg/dL — AB
Leukocytes, UA: NEGATIVE
Nitrite: NEGATIVE
Protein, ur: NEGATIVE mg/dL
SPECIFIC GRAVITY, URINE: 1.014 (ref 1.005–1.030)
Urobilinogen, UA: 0.2 mg/dL (ref 0.0–1.0)
pH: 6.5 (ref 5.0–8.0)

## 2015-05-25 LAB — POC URINE PREG, ED: PREG TEST UR: NEGATIVE

## 2015-05-25 LAB — LIPASE, BLOOD: Lipase: 23 U/L (ref 22–51)

## 2015-05-25 MED ORDER — DICYCLOMINE HCL 20 MG PO TABS: 20.0000 mg | ORAL_TABLET | Freq: Two times a day (BID) | ORAL | Status: DC

## 2015-05-25 MED ORDER — METOCLOPRAMIDE HCL 5 MG/ML IJ SOLN
10.0000 mg | Freq: Once | INTRAMUSCULAR | Status: AC
  Administered 2015-05-25: 10 mg via INTRAVENOUS
  Filled 2015-05-25: qty 2

## 2015-05-25 MED ORDER — PROMETHAZINE HCL 25 MG/ML IJ SOLN
25.0000 mg | Freq: Once | INTRAMUSCULAR | Status: AC
  Administered 2015-05-25: 25 mg via INTRAVENOUS
  Filled 2015-05-25: qty 1

## 2015-05-25 MED ORDER — DIPHENHYDRAMINE HCL 50 MG/ML IJ SOLN
25.0000 mg | Freq: Once | INTRAMUSCULAR | Status: AC
Start: 2015-05-25 — End: 2015-05-25
  Administered 2015-05-25: 25 mg via INTRAVENOUS
  Filled 2015-05-25: qty 1

## 2015-05-25 MED ORDER — SODIUM CHLORIDE 0.9 % IV BOLUS (SEPSIS)
1000.0000 mL | Freq: Once | INTRAVENOUS | Status: AC
  Administered 2015-05-25: 1000 mL via INTRAVENOUS

## 2015-05-25 MED ORDER — ONDANSETRON 4 MG PO TBDP: 4.0000 mg | ORAL_TABLET | Freq: Three times a day (TID) | ORAL | Status: DC | PRN

## 2015-05-25 MED ORDER — ONDANSETRON HCL 4 MG/2ML IJ SOLN
4.0000 mg | Freq: Once | INTRAMUSCULAR | Status: AC
  Administered 2015-05-25: 4 mg via INTRAVENOUS
  Filled 2015-05-25: qty 2

## 2015-05-25 MED ORDER — DICYCLOMINE HCL 10 MG PO CAPS
10.0000 mg | ORAL_CAPSULE | Freq: Once | ORAL | Status: DC
  Filled 2015-05-25: qty 1

## 2015-05-25 MED ORDER — LORAZEPAM 2 MG/ML IJ SOLN
1.0000 mg | Freq: Once | INTRAMUSCULAR | Status: AC
  Administered 2015-05-25: 1 mg via INTRAVENOUS
  Filled 2015-05-25: qty 1

## 2015-05-25 NOTE — ED Notes (Signed)
Pt states she cannot take bentyl because she is nauseated.

## 2015-05-25 NOTE — ED Provider Notes (Signed)
CSN: [id number]     Arrival date & time 05/25/15  1102 History   First MD Initiated Contact with Patient 05/25/15 1105     Chief Complaint  Patient presents with  . Emesis   Frances Mccormick is a 29 y.o. female with a history of  Crohn's disease and noncompliant on medication who presents to the ED complaining of continued nausea and vomiting since yesterday.  Patient was seen for the same in the emergency department yesterday and was discharged feeling well. Patient reports she went to bed last night after taking her medications and drinking chicken broth. She reports she woke up early this morning feeling nauseated and vomited vomited 5 times today. She reports having epigastric and bilateral lower abdominal pain since yesterday that has gradually worsened and worsens with vomiting. She reports this feels like her chron's disease pain.  She denies changes to her pain today. She rates her pain at an 8/10 today. She last took nausea medication last night. She has taken nothing for treatment today. She reports chills, but no fever.  She complains of positional lightheadedness. She denies any previous abdominal surgeries. She denies any sick contacts. Her last bowel movement was yesterday and was normal. The patient denies fevers, hematemesis, hematochezia, diarrhea, headache, sore throat, urinary symptoms, hematuria, vaginal bleeding, vaginal discharge, or rashes.  (Consider location/radiation/quality/duration/timing/severity/associated sxs/prior Treatment) HPI  Past Medical History  Diagnosis Date  . Crohn's disease   . Non-compliant patient    Past Surgical History  Procedure Laterality Date  . Induced abortion     No family history on file. History  Substance Use Topics  . Smoking status: Never Smoker   . Smokeless tobacco: Not on file  . Alcohol Use: Yes     Comment: ocassionally   OB History    No data available     Review of Systems  Constitutional: Positive for chills.  Negative for fever.  HENT: Negative for congestion and sore throat.   Eyes: Negative for pain and visual disturbance.  Respiratory: Negative for cough, shortness of breath and wheezing.   Cardiovascular: Negative for chest pain.  Gastrointestinal: Positive for nausea, vomiting and abdominal pain. Negative for diarrhea, constipation and blood in stool.  Genitourinary: Negative for dysuria, urgency, frequency, hematuria, vaginal bleeding, vaginal discharge and difficulty urinating.  Musculoskeletal: Negative for back pain and neck pain.  Skin: Negative for rash.  Neurological: Positive for light-headedness. Negative for dizziness, syncope and headaches.      Allergies  Bee venom and Ciprofloxacin  Home Medications   Prior to Admission medications   Medication Sig Start Date End Date Taking? Authorizing Provider  ibuprofen (ADVIL,MOTRIN) 200 MG tablet Take 400 mg by mouth every 6 (six) hours as needed for mild pain.   Yes Historical Provider, MD  predniSONE (DELTASONE) 20 MG tablet 3 tabs po day one, then 2 tabs daily x 4 days 05/24/15  Yes Domenic Moras, PA-C  promethazine (PHENERGAN) 25 MG tablet Take 1 tablet (25 mg total) by mouth every 6 (six) hours as needed for nausea or vomiting. 05/24/15  Yes Domenic Moras, PA-C  dicyclomine (BENTYL) 20 MG tablet Take 1 tablet (20 mg total) by mouth 2 (two) times daily. 05/25/15   Waynetta Pean, PA-C  ondansetron (ZOFRAN ODT) 4 MG disintegrating tablet Take 1 tablet (4 mg total) by mouth every 8 (eight) hours as needed for nausea or vomiting. 05/25/15   Waynetta Pean, PA-C  oxyCODONE-acetaminophen (PERCOCET/ROXICET) 5-325 MG per tablet Take 2 tablets by mouth  every 4 (four) hours as needed for severe pain. Patient not taking: Reported on 05/24/2015 04/13/15   Kaitlyn Szekalski, PA-C   BP 153/104 mmHg  Pulse 89  Temp(Src) 98.3 F (36.8 C) (Oral)  Resp 15  SpO2 100% Physical Exam  Constitutional: She appears well-developed and well-nourished. No  distress.   Nontoxic appearing.  HENT:  Head: Normocephalic and atraumatic.  Right Ear: External ear normal.  Left Ear: External ear normal.  Mouth/Throat: Oropharynx is clear and moist. No oropharyngeal exudate.  Eyes: Conjunctivae are normal. Pupils are equal, round, and reactive to light. Right eye exhibits no discharge. Left eye exhibits no discharge.  Neck: Neck supple. No JVD present. No tracheal deviation present.  Cardiovascular: Normal rate, regular rhythm, normal heart sounds and intact distal pulses.  Exam reveals no gallop and no friction rub.   No murmur heard. Pulmonary/Chest: Effort normal and breath sounds normal. No respiratory distress. She has no wheezes. She has no rales.  Abdominal: Soft. Bowel sounds are normal. She exhibits no distension and no mass. There is tenderness. There is no rebound and no guarding.   Abdomen soft. Bowel sounds are present. Patient has mild epigastric and bilateral low abdominal tenderness to palpation. No rebound tenderness. Negative Rovsing sign. Negative psoas and obturator sign. No CVA tenderness.   Musculoskeletal: She exhibits no edema.  Lymphadenopathy:    She has no cervical adenopathy.  Neurological: She is alert. Coordination normal.  Skin: Skin is warm and dry. No rash noted. She is not diaphoretic. No erythema. No pallor.  Psychiatric: She has a normal mood and affect. Her behavior is normal.  Nursing note and vitals reviewed.   ED Course  Procedures (including critical care time) Labs Review Labs Reviewed  CBC WITH DIFFERENTIAL/PLATELET - Abnormal; Notable for the following:    WBC 17.1 (*)    RBC 6.14 (*)    Hemoglobin 16.0 (*)    HCT 47.7 (*)    MCV 77.7 (*)    Neutrophils Relative % 84 (*)    Neutro Abs 14.3 (*)    Lymphocytes Relative 11 (*)    All other components within normal limits  COMPREHENSIVE METABOLIC PANEL - Abnormal; Notable for the following:    Potassium 3.3 (*)    Glucose, Bld 117 (*)    Total  Protein 9.3 (*)    ALT 11 (*)    All other components within normal limits  URINALYSIS, ROUTINE W REFLEX MICROSCOPIC (NOT AT [facility]) - Abnormal; Notable for the following:    APPearance CLOUDY (*)    Hgb urine dipstick MODERATE (*)    Ketones, ur 15 (*)    All other components within normal limits  URINE MICROSCOPIC-ADD ON - Abnormal; Notable for the following:    Squamous Epithelial / LPF MANY (*)    Bacteria, UA MANY (*)    All other components within normal limits  LIPASE, BLOOD  POC URINE PREG, ED    Imaging Review No results found.   EKG Interpretation None      Filed Vitals:   05/25/15 1108 05/25/15 1354 05/25/15 1434 05/25/15 1544  BP: 186/117 129/81 159/115 153/104  Pulse: 80 102 93 89  Temp: 98.8 F (37.1 C) 98.1 F (36.7 C) 98.3 F (36.8 C)   TempSrc: Oral Oral Oral   Resp: 17 16  15   SpO2: 98% 100% 100% 100%     MDM   Meds given in ED:  Medications  dicyclomine (BENTYL) capsule 10 mg (not administered)  sodium chloride 0.9 % bolus 1,000 mL (0 mLs Intravenous Stopped 05/25/15 1300)  ondansetron (ZOFRAN) injection 4 mg (4 mg Intravenous Given 05/25/15 1144)  sodium chloride 0.9 % bolus 1,000 mL (1,000 mLs Intravenous New Bag/Given 05/25/15 1325)  promethazine (PHENERGAN) injection 25 mg (25 mg Intravenous Given 05/25/15 1325)  LORazepam (ATIVAN) injection 1 mg (1 mg Intravenous Given 05/25/15 1544)  diphenhydrAMINE (BENADRYL) injection 25 mg (25 mg Intravenous Given 05/25/15 1544)  metoCLOPramide (REGLAN) injection 10 mg (10 mg Intravenous Given 05/25/15 1544)    New Prescriptions   DICYCLOMINE (BENTYL) 20 MG TABLET    Take 1 tablet (20 mg total) by mouth 2 (two) times daily.   ONDANSETRON (ZOFRAN ODT) 4 MG DISINTEGRATING TABLET    Take 1 tablet (4 mg total) by mouth every 8 (eight) hours as needed for nausea or vomiting.    Final diagnoses:  Non-intractable vomiting with nausea, vomiting of unspecified type  LLQ abdominal pain   This is a 29 y.o. female  with a history of  Crohn's disease and noncompliant on medication who presents to the ED complaining of continued nausea and vomiting since yesterday.  Patient was seen for the same in the emergency department yesterday and was discharged feeling well. Patient reports she went to bed last night after taking her medications and drinking chicken broth. She reports she woke up early this morning feeling nauseated and vomited vomited 5 times today. She reports having epigastric and bilateral lower abdominal pain since yesterday that worsens with vomiting. She reports this feels like her chron's disease pain.  She denies changes to her pain today. She rates her pain at an 8/10 today. She last took nausea medication last night.  On exam the patient is afebrile and nontoxic appearing. Patient's abdomen is soft and she has mild epigastric and bilateral lower abdominal tenderness to palpation. No peritoneal signs. No rebound tenderness.  Negative Rovsing sign. Negative psoas and obturator sign. She has had two CT scans this year already and her most recent was 5/16. Discussed with patient about CT scan and with joint decision making we decided not to do a CT scan today. Patient given fluid bolus and Zofran. At reevaluation the patient reports she is still feeling nauseated and has vomited again. Nursing staff reports this was a small amount of spit and they have seen no stomach contents. Will try phenergan.   She has a negative urine pregnancy test. CBC shows leukocytosis with white count of 17.1 which is around her baseline with multiple CBCs showing white counts in the teens. CMP shows a potassium of 3.3 and normal kidney function.  Urinalysis is nitrite and leukocyte negative.  Lipase is normal.  At another reevaluation the patient reports she still feels nauseated after phenergan. Patient given reglan, benadryl and ativan with then good relief of her nausea. The patient is found sleeping in the room. She has tolerated  water in the ED without vomiting. She reports feeling much better. Extensive education on BRAT diet and how to help with her nausea. Patient feels comfortable being discharged. Will discharge with RX for zofran and bentyl. She reports she has prednisone at home, advised she can start taking again when she is feeling better. Strict return precautions given. I advised the patient to follow-up with their primary care provider and GI this week. I advised the patient to return to the emergency department with new or worsening symptoms or new concerns. The patient verbalized understanding and agreement with plan.  This patient was discussed with Dr. Colin Rhein who agrees with assessment and plan.    Waynetta Pean, PA-C 05/25/15 1659  Debby Freiberg, MD 05/29/15 [phone]

## 2015-05-25 NOTE — ED Notes (Signed)
Pt declines taking Bentyl at this time and states she would like to wait until nausea subsides.

## 2015-05-25 NOTE — ED Notes (Signed)
Pt was here yesterday.  C/o continued vomiting.  Pt currently vomiting in triage.

## 2015-05-25 NOTE — Progress Notes (Signed)
Pt confirmed pcp as Frances Mccormick EPIC updated

## 2015-05-25 NOTE — ED Notes (Signed)
Pt continues to c/o nausea and vomiting.  Vomiting not witnessed.  Pt declines to take bentyl.

## 2015-05-25 NOTE — Discharge Instructions (Signed)
Abdominal Pain °Many things can cause abdominal pain. Usually, abdominal pain is not caused by a disease and will improve without treatment. It can often be observed and treated at home. Your health care provider will do a physical exam and possibly order blood tests and X-rays to help determine the seriousness of your pain. However, in many cases, more time must pass before a clear cause of the pain can be found. Before that point, your health care provider may not know if you need more testing or further treatment. °HOME CARE INSTRUCTIONS  °Monitor your abdominal pain for any changes. The following actions may help to alleviate any discomfort you are experiencing: °· Only take over-the-counter or prescription medicines as directed by your health care provider. °· Do not take laxatives unless directed to do so by your health care provider. °· Try a clear liquid diet (broth, tea, or water) as directed by your health care provider. Slowly move to a bland diet as tolerated. °SEEK MEDICAL CARE IF: °· You have unexplained abdominal pain. °· You have abdominal pain associated with nausea or diarrhea. °· You have pain when you urinate or have a bowel movement. °· You experience abdominal pain that wakes you in the night. °· You have abdominal pain that is worsened or improved by eating food. °· You have abdominal pain that is worsened with eating fatty foods. °· You have a fever. °SEEK IMMEDIATE MEDICAL CARE IF:  °· Your pain does not go away within 2 hours. °· You keep throwing up (vomiting). °· Your pain is felt only in portions of the abdomen, such as the right side or the left lower portion of the abdomen. °· You pass bloody or black tarry stools. °MAKE SURE YOU: °· Understand these instructions.   °· Will watch your condition.   °· Will get help right away if you are not doing well or get worse.   °Document Released: 08/27/2005 Document Revised: 11/22/2013 Document Reviewed: 07/27/2013 °[employer]® Patient Information  ©2015 [employer]. This information is not intended to replace advice given to you by your health care provider. Make sure you discuss any questions you have with your health care provider. ° °Nausea and Vomiting °Nausea is a sick feeling that often comes before throwing up (vomiting). Vomiting is a reflex where stomach contents come out of your mouth. Vomiting can cause severe loss of body fluids (dehydration). Children and elderly adults can become dehydrated quickly, especially if they also have diarrhea. Nausea and vomiting are symptoms of a condition or disease. It is important to find the cause of your symptoms. °CAUSES  °· Direct irritation of the stomach lining. This irritation can result from increased acid production (gastroesophageal reflux disease), infection, food poisoning, taking certain medicines (such as nonsteroidal anti-inflammatory drugs), alcohol use, or tobacco use. °· Signals from the brain. These signals could be caused by a headache, heat exposure, an inner ear disturbance, increased pressure in the brain from injury, infection, a tumor, or a concussion, pain, emotional stimulus, or metabolic problems. °· An obstruction in the gastrointestinal tract (bowel obstruction). °· Illnesses such as diabetes, hepatitis, gallbladder problems, appendicitis, kidney problems, cancer, sepsis, atypical symptoms of a heart attack, or eating disorders. °· Medical treatments such as chemotherapy and radiation. °· Receiving medicine that makes you sleep (general anesthetic) during surgery. °DIAGNOSIS °Your caregiver may ask for tests to be done if the problems do not improve after a few days. Tests may also be done if symptoms are severe or if the reason for the nausea   and vomiting is not clear. Tests may include: °· Urine tests. °· Blood tests. °· Stool tests. °· Cultures (to look for evidence of infection). °· X-rays or other imaging studies. °Test results can help your caregiver make decisions about  treatment or the need for additional tests. °TREATMENT °You need to stay well hydrated. Drink frequently but in small amounts. You may wish to drink water, sports drinks, clear broth, or eat frozen ice pops or gelatin dessert to help stay hydrated. When you eat, eating slowly may help prevent nausea. There are also some antinausea medicines that may help prevent nausea. °HOME CARE INSTRUCTIONS  °· Take all medicine as directed by your caregiver. °· If you do not have an appetite, do not force yourself to eat. However, you must continue to drink fluids. °· If you have an appetite, eat a normal diet unless your caregiver tells you differently. °¨ Eat a variety of complex carbohydrates (rice, wheat, potatoes, bread), lean meats, yogurt, fruits, and vegetables. °¨ Avoid high-fat foods because they are more difficult to digest. °· Drink enough water and fluids to keep your urine clear or pale yellow. °· If you are dehydrated, ask your caregiver for specific rehydration instructions. Signs of dehydration may include: °¨ Severe thirst. °¨ Dry lips and mouth. °¨ Dizziness. °¨ Dark urine. °¨ Decreasing urine frequency and amount. °¨ Confusion. °¨ Rapid breathing or pulse. °SEEK IMMEDIATE MEDICAL CARE IF:  °· You have blood or brown flecks (like coffee grounds) in your vomit. °· You have black or bloody stools. °· You have a severe headache or stiff neck. °· You are confused. °· You have severe abdominal pain. °· You have chest pain or trouble breathing. °· You do not urinate at least once every 8 hours. °· You develop cold or clammy skin. °· You continue to vomit for longer than 24 to 48 hours. °· You have a fever. °MAKE SURE YOU:  °· Understand these instructions. °· Will watch your condition. °· Will get help right away if you are not doing well or get worse. °Document Released: 11/17/2005 Document Revised: 02/09/2012 Document Reviewed: 04/16/2011 °ExitCare® Patient Information ©2015 [employer]. This information is not  intended to replace advice given to you by your health care provider. Make sure you discuss any questions you have with your health care provider. ° °

## 2015-05-27 ENCOUNTER — Emergency Department (HOSPITAL_COMMUNITY)
Admission: EM | Admit: 2015-05-27 | Discharge: 2015-05-28 | Disposition: A | Discharge status: Home / Self Care | Payer: No Typology Code available for payment source | Attending: Emergency Medicine | Admitting: Emergency Medicine

## 2015-05-27 ENCOUNTER — Encounter (HOSPITAL_COMMUNITY): Payer: Self-pay | Admitting: Emergency Medicine

## 2015-05-27 DIAGNOSIS — Z9119 Patient's noncompliance with other medical treatment and regimen: Secondary | ICD-10-CM | POA: Insufficient documentation

## 2015-05-27 DIAGNOSIS — R109 Unspecified abdominal pain: Secondary | ICD-10-CM

## 2015-05-27 DIAGNOSIS — N938 Other specified abnormal uterine and vaginal bleeding: Secondary | ICD-10-CM | POA: Diagnosis not present

## 2015-05-27 DIAGNOSIS — Z79899 Other long term (current) drug therapy: Secondary | ICD-10-CM | POA: Insufficient documentation

## 2015-05-27 DIAGNOSIS — Z3202 Encounter for pregnancy test, result negative: Secondary | ICD-10-CM | POA: Diagnosis not present

## 2015-05-27 DIAGNOSIS — N939 Abnormal uterine and vaginal bleeding, unspecified: Secondary | ICD-10-CM | POA: Diagnosis present

## 2015-05-27 DIAGNOSIS — K5090 Crohn's disease, unspecified, without complications: Secondary | ICD-10-CM

## 2015-05-27 NOTE — ED Notes (Signed)
Bed: RF16 Expected date:  Expected time:  Means of arrival:  Comments: EMS 29yo abd pain / hx of crohns

## 2015-05-27 NOTE — ED Notes (Signed)
Urine sample collected and at the bedside.

## 2015-05-27 NOTE — ED Notes (Signed)
Pt arrived to the ED with a complaint of abdominal pain.  Pt is also complaining of vaginal bleeding which started today.  Pt has been seen several times since Thursday for abdominal pains.  Pt has chrons.  Pt states her vaginal bleeding has produced three pads today.  Pt states her period ended earlier last week.

## 2015-05-28 ENCOUNTER — Encounter (HOSPITAL_COMMUNITY): Payer: Self-pay

## 2015-05-28 ENCOUNTER — Emergency Department (HOSPITAL_COMMUNITY): Payer: No Typology Code available for payment source

## 2015-05-28 LAB — COMPREHENSIVE METABOLIC PANEL
ALT: 11 U/L — ABNORMAL LOW (ref 14–54)
AST: 17 U/L (ref 15–41)
Albumin: 4.5 g/dL (ref 3.5–5.0)
Alkaline Phosphatase: 69 U/L (ref 38–126)
Anion gap: 11 (ref 5–15)
BUN: 13 mg/dL (ref 6–20)
CALCIUM: 8.7 mg/dL — AB (ref 8.9–10.3)
CO2: 26 mmol/L (ref 22–32)
CREATININE: 0.82 mg/dL (ref 0.44–1.00)
Chloride: 99 mmol/L — ABNORMAL LOW (ref 101–111)
GFR calc Af Amer: >60 mL/min (ref >60)
GFR calc non Af Amer: >60 mL/min (ref >60)
Glucose, Bld: 125 mg/dL — ABNORMAL HIGH (ref 65–99)
Potassium: 2.8 mmol/L — ABNORMAL LOW (ref 3.5–5.1)
SODIUM: 136 mmol/L (ref 135–145)
TOTAL PROTEIN: 8.5 g/dL — AB (ref 6.5–8.1)
Total Bilirubin: 0.9 mg/dL (ref 0.3–1.2)

## 2015-05-28 LAB — CBC WITH DIFFERENTIAL/PLATELET
Basophils Absolute: 0.1 10*3/uL (ref 0.0–0.1)
Basophils Relative: 0 % (ref 0–1)
Eosinophils Absolute: 0 10*3/uL (ref 0.0–0.7)
Eosinophils Relative: 0 % (ref 0–5)
HEMATOCRIT: 46 % (ref 36.0–46.0)
Hemoglobin: 15.6 g/dL — ABNORMAL HIGH (ref 12.0–15.0)
LYMPHS PCT: 8 % — AB (ref 12–46)
Lymphs Abs: 1.5 10*3/uL (ref 0.7–4.0)
MCH: 25.9 pg — ABNORMAL LOW (ref 26.0–34.0)
MCHC: 33.9 g/dL (ref 30.0–36.0)
MCV: 76.4 fL — AB (ref 78.0–100.0)
MONO ABS: 0.7 10*3/uL (ref 0.1–1.0)
Monocytes Relative: 4 % (ref 3–12)
Neutro Abs: 18.2 10*3/uL — ABNORMAL HIGH (ref 1.7–7.7)
Neutrophils Relative %: 88 % — ABNORMAL HIGH (ref 43–77)
PLATELETS: 314 10*3/uL (ref 150–400)
RBC: 6.02 MIL/uL — AB (ref 3.87–5.11)
RDW: 14.6 % (ref 11.5–15.5)
WBC: 20.5 10*3/uL — ABNORMAL HIGH (ref 4.0–10.5)

## 2015-05-28 LAB — GC/CHLAMYDIA PROBE AMP (~~LOC~~) NOT AT ARMC
Chlamydia: NEGATIVE
Neisseria Gonorrhea: NEGATIVE

## 2015-05-28 LAB — POC URINE PREG, ED: Preg Test, Ur: NEGATIVE

## 2015-05-28 LAB — WET PREP, GENITAL
Trich, Wet Prep: NONE SEEN
WBC WET PREP: NONE SEEN
YEAST WET PREP: NONE SEEN

## 2015-05-28 MED ORDER — ONDANSETRON HCL 4 MG/2ML IJ SOLN
4.0000 mg | Freq: Once | INTRAMUSCULAR | Status: AC
  Administered 2015-05-28: 4 mg via INTRAVENOUS
  Filled 2015-05-28: qty 2

## 2015-05-28 MED ORDER — IOHEXOL 300 MG/ML  SOLN
100.0000 mL | Freq: Once | INTRAMUSCULAR | Status: AC | PRN
  Administered 2015-05-28: 100 mL via INTRAVENOUS

## 2015-05-28 MED ORDER — HYDROCODONE-ACETAMINOPHEN 5-325 MG PO TABS: 1.0000 | ORAL_TABLET | Freq: Four times a day (QID) | ORAL | Status: DC | PRN

## 2015-05-28 MED ORDER — SODIUM CHLORIDE 0.9 % IV BOLUS (SEPSIS)
1000.0000 mL | Freq: Once | INTRAVENOUS | Status: AC
  Administered 2015-05-28: 1000 mL via INTRAVENOUS

## 2015-05-28 MED ORDER — MORPHINE SULFATE 4 MG/ML IJ SOLN
4.0000 mg | Freq: Once | INTRAMUSCULAR | Status: AC
  Administered 2015-05-28: 4 mg via INTRAVENOUS
  Filled 2015-05-28: qty 1

## 2015-05-28 MED ORDER — ONDANSETRON 8 MG PO TBDP: 8.0000 mg | ORAL_TABLET | Freq: Three times a day (TID) | ORAL | Status: DC | PRN

## 2015-05-28 MED ORDER — HYDROCODONE-ACETAMINOPHEN 5-325 MG PO TABS
1.0000 | ORAL_TABLET | Freq: Once | ORAL | Status: AC
  Administered 2015-05-28: 1 via ORAL
  Filled 2015-05-28: qty 1

## 2015-05-28 NOTE — Discharge Instructions (Signed)
Dysfunctional Uterine Bleeding Normally, menstrual periods begin between ages 67 to 42 in young women. A normal menstrual cycle/period may begin every 23 days up to 35 days and lasts from 1 to 7 days. Around 12 to 14 days before your menstrual period starts, ovulation (ovary produces an egg) occurs. When counting the time between menstrual periods, count from the first day of bleeding of the previous period to the first day of bleeding of the next period. Dysfunctional (abnormal) uterine bleeding is bleeding that is different from a normal menstrual period. Your periods may come earlier or later than usual. They may be lighter, have blood clots or be heavier. You may have bleeding between periods, or you may skip one period or more. You may have bleeding after sexual intercourse, bleeding after menopause, or no menstrual period. CAUSES   Pregnancy (normal, miscarriage, tubal).  IUDs (intrauterine device, birth control).  Birth control pills.  Hormone treatment.  Menopause.  Infection of the cervix.  Blood clotting problems.  Infection of the inside lining of the uterus.  Endometriosis, inside lining of the uterus growing in the pelvis and other female organs.  Adhesions (scar tissue) inside the uterus.  Obesity or severe weight loss.  Uterine polyps inside the uterus.  Cancer of the vagina, cervix, or uterus.  Ovarian cysts or polycystic ovary syndrome.  Medical problems (diabetes, thyroid disease).  Uterine fibroids (noncancerous tumor).  Problems with your female hormones.  Endometrial hyperplasia, very thick lining and enlarged cells inside of the uterus.  Medicines that interfere with ovulation.  Radiation to the pelvis or abdomen.  Chemotherapy. DIAGNOSIS   Your doctor will discuss the history of your menstrual periods, medicines you are taking, changes in your weight, stress in your life, and any medical problems you may have.  Your doctor will do a physical  and pelvic examination.  Your doctor may want to perform certain tests to make a diagnosis, such as:  Pap test.  Blood tests.  Cultures for infection.  CT scan.  Ultrasound.  Hysteroscopy.  Laparoscopy.  MRI.  Hysterosalpingography.  D and C.  Endometrial biopsy. TREATMENT  Treatment will depend on the cause of the dysfunctional uterine bleeding (DUB). Treatment may include:  Observing your menstrual periods for a couple of months.  Prescribing medicines for medical problems, including:  Antibiotics.  Hormones.  Birth control pills.  Removing an IUD (intrauterine device, birth control).  Surgery:  D and C (scrape and remove tissue from inside the uterus).  Laparoscopy (examine inside the abdomen with a lighted tube).  Uterine ablation (destroy lining of the uterus with electrical current, laser, heat, or freezing).  Hysteroscopy (examine cervix and uterus with a lighted tube).  Hysterectomy (remove the uterus). HOME CARE INSTRUCTIONS   If medicines were prescribed, take exactly as directed. Do not change or switch medicines without consulting your caregiver.  Long term heavy bleeding may result in iron deficiency. Your caregiver may have prescribed iron pills. They help replace the iron that your body lost from heavy bleeding. Take exactly as directed.  Do not take aspirin or medicines that contain aspirin one week before or during your menstrual period. Aspirin may make the bleeding worse.  If you need to change your sanitary pad or tampon more than once every 2 hours, stay in bed with your feet elevated and a cold pack on your lower abdomen. Rest as much as possible, until the bleeding stops or slows down.  Eat well-balanced meals. Eat foods high in iron. Examples  are:  Leafy green vegetables.  Whole-grain breads and cereals.  Eggs.  Meat.  Liver.  Do not try to lose weight until the abnormal bleeding has stopped and your blood iron level is  back to normal. Do not lift more than ten pounds or do strenuous activities when you are bleeding.  For a couple of months, make note on your calendar, marking the start and ending of your period, and the type of bleeding (light, medium, heavy, spotting, clots or missed periods). This is for your caregiver to better evaluate your problem. SEEK MEDICAL CARE IF:   You develop nausea (feeling sick to your stomach) and vomiting, dizziness, or diarrhea while you are taking your medicine.  You are getting lightheaded or weak.  You have any problems that may be related to the medicine you are taking.  You develop pain with your DUB.  You want to remove your IUD.  You want to stop or change your birth control pills or hormones.  You have any type of abnormal bleeding mentioned above.  You are over 69 years old and have not had a menstrual period yet.  You are 29 years old and you are still having menstrual periods.  You have any of the symptoms mentioned above.  You develop a rash. SEEK IMMEDIATE MEDICAL CARE IF:   An oral temperature above 102 F (38.9 C) develops.  You develop chills.  You are changing your sanitary pad or tampon more than once an hour.  You develop abdominal pain.  You pass out or faint. Document Released: 11/14/2000 Document Revised: 02/09/2012 Document Reviewed: 10/16/2009 [facility] Patient Information 2015 [place], [place]. This information is not intended to replace advice given to you by your health care provider. Make sure you discuss any questions you have with your health care provider. Crohn Disease Crohn disease is a long-term (chronic) soreness and redness (inflammation) of the intestines (bowel). It can affect any portion of the digestive tract, from the mouth to the anus. It can also cause problems outside the digestive tract. Crohn disease is closely related to a disease called ulcerative colitis (together, these two diseases are called inflammatory  bowel disease).  CAUSES  The cause of Crohn disease is not known. One Link Snuffer is that, in an easily affected person, the immune system is triggered to attack the body's own digestive tissue. Crohn disease runs in families. It seems to be more common in certain geographic areas and amongst certain races. There are no clear-cut dietary causes.  SYMPTOMS  Crohn disease can cause many different symptoms since it can affect many different parts of the body. Symptoms include:  Fatigue.  Weight loss.  Chronic diarrhea, sometime bloody.  Abdominal pain and cramps.  Fever.  Ulcers or canker sores in the mouth or rectum.  Anemia (low red blood cells).  Arthritis, skin problems, and eye problems may occur. Complications of Crohn disease can include:  Series of holes (perforation) of the bowel.  Portions of the intestines sticking to each other (adhesions).  Obstruction of the bowel.  Fistula formation, typically in the rectal area but also in other areas. A fistula is an opening between the bowels and the outside, or between the bowels and another organ.  A painful crack in the mucous membrane of the anus (rectal fissure). DIAGNOSIS  Your caregiver may suspect Crohn disease based on your symptoms and an exam. Blood tests may confirm that there is a problem. You may be asked to submit a stool specimen for  examination. X-rays and CT scans may be necessary. Ultimately, the diagnosis is usually made after a procedure that uses a flexible tube that is inserted via your mouth or your anus. This is done under sedation and is called either an upper endoscopy or colonoscopy. With these tests, the specialist can take tiny tissue samples and remove them from the inside of the bowel (biopsy). Examination of this biopsy tissue under a microscope can reveal Crohn disease as the cause of your symptoms. Due to the many different forms that Crohn disease can take, symptoms may be present for several years  before a diagnosis is made. TREATMENT  Medications are often used to decrease inflammation and control the immune system. These include medicines related to aspirin, steroid medications, and newer and stronger medications to slow down the immune system. Some medications may be used as suppositories or enemas. A number of other medications are used or have been studied. Your caregiver will make specific recommendations. HOME CARE INSTRUCTIONS   Symptoms such as diarrhea can be controlled with medications. Avoid foods that have a laxative effect such as fresh fruit, vegetables, and dairy products. During flare-ups, you can rest your bowel by refraining from solid foods. Drink clear liquids frequently during the day. (Electrolyte or rehydrating fluids are best. Your caregiver can help you with suggestions.) Drink often to prevent loss of body fluids (dehydration). When diarrhea has cleared, eat small meals and more frequently. Avoid food additives and stimulants such as caffeine (coffee, tea, or chocolate). Enzyme supplements may help if you develop intolerance to a sugar in dairy products (lactose). Ask your caregiver or dietitian about specific dietary instructions.  Try to maintain a positive attitude. Learn relaxation techniques such as self-hypnosis, mental imaging, and muscle relaxation.  If possible, avoid stresses which can aggravate your condition.  Exercise regularly.  Follow your diet.  Always get plenty of rest. SEEK MEDICAL CARE IF:   Your symptoms fail to improve after a week or two of new treatment.  You experience continued weight loss.  You have ongoing cramps or loose bowels.  You develop a new skin rash, skin sores, or eye problems. SEEK IMMEDIATE MEDICAL CARE IF:   You have worsening of your symptoms or develop new symptoms.  You have a fever.  You develop bloody diarrhea.  You develop severe abdominal pain. MAKE SURE YOU:   Understand these instructions.  Will  watch your condition.  Will get help right away if you are not doing well or get worse. Document Released: 08/27/2005 Document Revised: 04/03/2014 Document Reviewed: 07/26/2007 [facility] Patient Information 2015 [place], [place]. This information is not intended to replace advice given to you by your health care provider. Make sure you discuss any questions you have with your health care provider.

## 2015-05-28 NOTE — ED Provider Notes (Signed)
CSN: [id number]     Arrival date & time 05/27/15  2207 History   First MD Initiated Contact with Patient 05/27/15 2308     Chief Complaint  Patient presents with  . Vaginal Bleeding  . Emesis     (Consider location/radiation/quality/duration/timing/severity/associated sxs/prior Treatment) HPI Comments: Pt comes in with cc of abd pain and vaginal bleeding. Pt has hx of crohns dz. She reports that she has been having off and on abd pain since Wednesday. She reports that she has been to the ER 2 times with abd pain since then. She was discharged yday, and started having worsening of her pain again earlier. She has had about 5-10 episodes of emesis, bilious and with some blood in it. Pt also started having some vaginal bleeding today. Pt is not pregnant as far as she knows, she has no vaginal discharge, bleeding. Pt doesn't have a GI doctor. She was started on prednisone, but is unable to keep meds down.   ROS 10 Systems reviewed and are negative for acute change except as noted in the HPI.     Patient is a 29 y.o. female presenting with vaginal bleeding and vomiting. The history is provided by the patient.  Vaginal Bleeding Associated symptoms: abdominal pain and nausea   Emesis Associated symptoms: abdominal pain   Associated symptoms: no diarrhea     Past Medical History  Diagnosis Date  . Crohn's disease   . Non-compliant patient    Past Surgical History  Procedure Laterality Date  . Induced abortion     History reviewed. No pertinent family history. History  Substance Use Topics  . Smoking status: Never Smoker   . Smokeless tobacco: Not on file  . Alcohol Use: Yes     Comment: ocassionally   OB History    No data available     Review of Systems  Gastrointestinal: Positive for nausea, vomiting and abdominal pain. Negative for diarrhea.  Genitourinary: Positive for vaginal bleeding.  All other systems reviewed and are negative.     Allergies  Bee venom and  Ciprofloxacin  Home Medications   Prior to Admission medications   Medication Sig Start Date End Date Taking? Authorizing Provider  dicyclomine (BENTYL) 20 MG tablet Take 1 tablet (20 mg total) by mouth 2 (two) times daily. 05/25/15  Yes Waynetta Pean, PA-C  ibuprofen (ADVIL,MOTRIN) 200 MG tablet Take 400 mg by mouth every 6 (six) hours as needed for mild pain.   Yes Historical Provider, MD  predniSONE (DELTASONE) 20 MG tablet 3 tabs po day one, then 2 tabs daily x 4 days 05/24/15  Yes Domenic Moras, PA-C  promethazine (PHENERGAN) 25 MG tablet Take 1 tablet (25 mg total) by mouth every 6 (six) hours as needed for nausea or vomiting. 05/24/15  Yes Domenic Moras, PA-C  HYDROcodone-acetaminophen (NORCO/VICODIN) 5-325 MG per tablet Take 1 tablet by mouth every 6 (six) hours as needed. 05/28/15   Varney Biles, MD  ondansetron (ZOFRAN ODT) 8 MG disintegrating tablet Take 1 tablet (8 mg total) by mouth every 8 (eight) hours as needed for nausea. 05/28/15   Varney Biles, MD  oxyCODONE-acetaminophen (PERCOCET/ROXICET) 5-325 MG per tablet Take 2 tablets by mouth every 4 (four) hours as needed for severe pain. Patient not taking: Reported on 05/24/2015 04/13/15   Alvina Chou, PA-C   BP 164/112 mmHg  Pulse 89  Temp(Src) 98.4 F (36.9 C) (Oral)  Resp 20  SpO2 100%  LMP 05/17/2015 Physical Exam  Constitutional: She is oriented to  person, place, and time. She appears well-developed.  HENT:  Head: Normocephalic and atraumatic.  Eyes: Conjunctivae and EOM are normal. Pupils are equal, round, and reactive to light.  Neck: Normal range of motion. Neck supple.  Cardiovascular: Normal rate, regular rhythm, normal heart sounds and intact distal pulses.   No murmur heard. Pulmonary/Chest: Effort normal. No respiratory distress. She has no wheezes.  Abdominal: Soft. Bowel sounds are normal. She exhibits no distension. There is tenderness. There is guarding. There is no rebound.  Lower quadrant and right sided  tenderness  Genitourinary: Vagina normal and uterus normal.  External exam - normal, no lesions Speculum exam: Pt has some BLOODY discharge Bimanual exam: Patient has no CMT, no adnexal tenderness or fullness and cervical os is closed  Neurological: She is alert and oriented to person, place, and time.  Skin: Skin is warm and dry.    ED Course  Procedures (including critical care time) Labs Review Labs Reviewed  WET PREP, GENITAL - Abnormal; Notable for the following:    Clue Cells Wet Prep HPF POC FEW (*)    All other components within normal limits  CBC WITH DIFFERENTIAL/PLATELET - Abnormal; Notable for the following:    WBC 20.5 (*)    RBC 6.02 (*)    Hemoglobin 15.6 (*)    MCV 76.4 (*)    MCH 25.9 (*)    Neutrophils Relative % 88 (*)    Neutro Abs 18.2 (*)    Lymphocytes Relative 8 (*)    All other components within normal limits  COMPREHENSIVE METABOLIC PANEL - Abnormal; Notable for the following:    Potassium 2.8 (*)    Chloride 99 (*)    Glucose, Bld 125 (*)    Calcium 8.7 (*)    Total Protein 8.5 (*)    ALT 11 (*)    All other components within normal limits  POC URINE PREG, ED  GC/CHLAMYDIA PROBE AMP ([place]) NOT AT [facility]    Imaging Review Ct Abdomen Pelvis W Contrast  05/28/2015   CLINICAL DATA:  Abdominal pain, leukocytosis, history of Crohn's disease.  EXAM: CT ABDOMEN AND PELVIS WITH CONTRAST  TECHNIQUE: Multidetector CT imaging of the abdomen and pelvis was performed using the standard protocol following bolus administration of intravenous contrast.  CONTRAST:  127m OMNIPAQUE IOHEXOL 300 MG/ML  SOLN  COMPARISON:  04/13/2015  FINDINGS: The appendix is normal. The stomach, small bowel and colon are normal. There is no evidence of active Crohn's disease. No focal inflammatory changes are evident in the abdomen or pelvis. Mildly prominent mesenteric nodes are present in the abdominal right lower quadrant. There is no obstruction. There is no extraluminal air.   There are normal appearances of the liver, except for mild focal fat adjacent to the falciform ligament. The gallbladder and bile ducts appear normal. The spleen, pancreas, adrenals and kidneys appear normal  The abdominal aorta is normal in caliber without atherosclerotic calcification.  The uterus and ovaries appear normal.  No acute inflammatory changes are evident in the abdomen or pelvis.  There is no ascites.  There is no significant abnormality in lower chest.  There is no significant musculoskeletal abnormality.  IMPRESSION: 1. No evidence of active Crohn's disease. No acute inflammatory changes are evident. 2. Mildly prominent mesenteric nodes in the right lower quadrant. This is nonspecific. Mesenteric adenitis could be considered.   Electronically Signed   By: DAndreas NewportM.D.   On: 05/28/2015 04:48     EKG Interpretation None  MDM   Final diagnoses:  Abdominal pain  DUB (dysfunctional uterine bleeding)  Crohn's disease, without complications    Pt comes in with abd pain and vaginal bleeding. 3rd visit to the ER in the last week with the abd pain and nausea. She reports inability to tolerate po. PT now also having some vaginal bleeding. CT abd ordered, as the WC has persistently gone up. PT is not taking steroids due to her nausea. CT was ordered, given this was her 3rd visit and her pain has gotten worse- and is neg for any acute pathology. Advised pcp and GI f/u.  Varney Biles, MD 05/29/15 2003

## 2015-05-28 NOTE — ED Notes (Signed)
Pt was given water to drink.  Pt presently has not had an episode of emesis after drinking

## 2015-06-08 ENCOUNTER — Emergency Department (HOSPITAL_COMMUNITY): Payer: No Typology Code available for payment source

## 2015-06-08 ENCOUNTER — Encounter (HOSPITAL_COMMUNITY): Payer: Self-pay

## 2015-06-08 ENCOUNTER — Emergency Department (HOSPITAL_COMMUNITY)
Admission: EM | Admit: 2015-06-08 | Discharge: 2015-06-08 | Disposition: A | Discharge status: Home / Self Care | Payer: No Typology Code available for payment source | Attending: Emergency Medicine | Admitting: Emergency Medicine

## 2015-06-08 DIAGNOSIS — R1032 Left lower quadrant pain: Secondary | ICD-10-CM | POA: Diagnosis not present

## 2015-06-08 DIAGNOSIS — R1031 Right lower quadrant pain: Secondary | ICD-10-CM | POA: Insufficient documentation

## 2015-06-08 DIAGNOSIS — R350 Frequency of micturition: Secondary | ICD-10-CM | POA: Insufficient documentation

## 2015-06-08 DIAGNOSIS — R197 Diarrhea, unspecified: Secondary | ICD-10-CM | POA: Diagnosis not present

## 2015-06-08 DIAGNOSIS — Z8719 Personal history of other diseases of the digestive system: Secondary | ICD-10-CM | POA: Diagnosis not present

## 2015-06-08 DIAGNOSIS — Z3202 Encounter for pregnancy test, result negative: Secondary | ICD-10-CM | POA: Insufficient documentation

## 2015-06-08 DIAGNOSIS — R1011 Right upper quadrant pain: Secondary | ICD-10-CM | POA: Diagnosis not present

## 2015-06-08 DIAGNOSIS — Z79899 Other long term (current) drug therapy: Secondary | ICD-10-CM | POA: Insufficient documentation

## 2015-06-08 DIAGNOSIS — R3915 Urgency of urination: Secondary | ICD-10-CM | POA: Insufficient documentation

## 2015-06-08 DIAGNOSIS — R112 Nausea with vomiting, unspecified: Secondary | ICD-10-CM | POA: Insufficient documentation

## 2015-06-08 DIAGNOSIS — Z9119 Patient's noncompliance with other medical treatment and regimen: Secondary | ICD-10-CM | POA: Diagnosis not present

## 2015-06-08 DIAGNOSIS — R109 Unspecified abdominal pain: Secondary | ICD-10-CM

## 2015-06-08 LAB — URINALYSIS, ROUTINE W REFLEX MICROSCOPIC
BILIRUBIN URINE: NEGATIVE
Glucose, UA: NEGATIVE mg/dL
Ketones, ur: >80 mg/dL — AB
LEUKOCYTES UA: NEGATIVE
Nitrite: NEGATIVE
PROTEIN: 30 mg/dL — AB
Specific Gravity, Urine: 1.022 (ref 1.005–1.030)
Urobilinogen, UA: 0.2 mg/dL (ref 0.0–1.0)
pH: 7 (ref 5.0–8.0)

## 2015-06-08 LAB — CBC
HEMATOCRIT: 45.5 % (ref 36.0–46.0)
HEMOGLOBIN: 15.6 g/dL — AB (ref 12.0–15.0)
MCH: 26.4 pg (ref 26.0–34.0)
MCHC: 34.3 g/dL (ref 30.0–36.0)
MCV: 77.1 fL — ABNORMAL LOW (ref 78.0–100.0)
Platelets: 321 10*3/uL (ref 150–400)
RBC: 5.9 MIL/uL — ABNORMAL HIGH (ref 3.87–5.11)
RDW: 15.2 % (ref 11.5–15.5)
WBC: 16.7 10*3/uL — ABNORMAL HIGH (ref 4.0–10.5)

## 2015-06-08 LAB — COMPREHENSIVE METABOLIC PANEL
ALBUMIN: 4.4 g/dL (ref 3.5–5.0)
ALK PHOS: 70 U/L (ref 38–126)
ALT: 17 U/L (ref 14–54)
ANION GAP: 12 (ref 5–15)
AST: 36 U/L (ref 15–41)
BUN: 7 mg/dL (ref 6–20)
CO2: 24 mmol/L (ref 22–32)
Calcium: 9.3 mg/dL (ref 8.9–10.3)
Chloride: 98 mmol/L — ABNORMAL LOW (ref 101–111)
Creatinine, Ser: 0.68 mg/dL (ref 0.44–1.00)
GFR calc Af Amer: >60 mL/min (ref >60)
GFR calc non Af Amer: >60 mL/min (ref >60)
Glucose, Bld: 125 mg/dL — ABNORMAL HIGH (ref 65–99)
Potassium: 5 mmol/L (ref 3.5–5.1)
Sodium: 134 mmol/L — ABNORMAL LOW (ref 135–145)
TOTAL PROTEIN: 8.7 g/dL — AB (ref 6.5–8.1)
Total Bilirubin: 0.7 mg/dL (ref 0.3–1.2)

## 2015-06-08 LAB — URINE MICROSCOPIC-ADD ON

## 2015-06-08 LAB — LIPASE, BLOOD: Lipase: 34 U/L (ref 22–51)

## 2015-06-08 LAB — POC URINE PREG, ED: Preg Test, Ur: NEGATIVE

## 2015-06-08 MED ORDER — SODIUM CHLORIDE 0.9 % IV BOLUS (SEPSIS)
1000.0000 mL | Freq: Once | INTRAVENOUS | Status: AC
  Administered 2015-06-08: 1000 mL via INTRAVENOUS

## 2015-06-08 MED ORDER — TRAMADOL HCL 50 MG PO TABS: 50.0000 mg | ORAL_TABLET | Freq: Four times a day (QID) | ORAL | Status: DC | PRN

## 2015-06-08 MED ORDER — ONDANSETRON HCL 4 MG/2ML IJ SOLN
4.0000 mg | Freq: Once | INTRAMUSCULAR | Status: AC
  Administered 2015-06-08: 4 mg via INTRAVENOUS
  Filled 2015-06-08: qty 2

## 2015-06-08 MED ORDER — MORPHINE SULFATE 4 MG/ML IJ SOLN
4.0000 mg | Freq: Once | INTRAMUSCULAR | Status: AC
  Administered 2015-06-08: 4 mg via INTRAVENOUS
  Filled 2015-06-08: qty 1

## 2015-06-08 MED ORDER — PROMETHAZINE HCL 25 MG RE SUPP: 25.0000 mg | Freq: Four times a day (QID) | RECTAL | Status: DC | PRN

## 2015-06-08 MED ORDER — PROMETHAZINE HCL 25 MG/ML IJ SOLN
25.0000 mg | Freq: Once | INTRAMUSCULAR | Status: AC
  Administered 2015-06-08: 25 mg via INTRAVENOUS
  Filled 2015-06-08: qty 1

## 2015-06-08 MED ORDER — DICYCLOMINE HCL 10 MG/ML IM SOLN
20.0000 mg | Freq: Once | INTRAMUSCULAR | Status: AC
  Administered 2015-06-08: 20 mg via INTRAMUSCULAR
  Filled 2015-06-08: qty 2

## 2015-06-08 NOTE — ED Notes (Signed)
Pt started last night with diarrhea and thought it was her crohns dx, then overnight she started vomiting

## 2015-06-08 NOTE — ED Notes (Signed)
Attempted to start IV x2 without success

## 2015-06-08 NOTE — ED Provider Notes (Signed)
CSN: [id number]     Arrival date & time 06/08/15  0608 History   First MD Initiated Contact with Patient 06/08/15 [phone]     Chief Complaint  Patient presents with  . Emesis     (Consider location/radiation/quality/duration/timing/severity/associated sxs/prior Treatment) HPI Comments: 29 y/o F c/o diarrhea beginning yesterday morning. Reports about 5 episodes of non-bloody, stool colored diarrhea yesterday morning into the afternoon, followed by gradual onset lower abdominal pain. Reports cramping, 10/10 abdominal pain located in RLQ and LLQ radiating to LUQ and occasionally to the R side of her back, with occasional sharp pains. No aggravating or alleviating factors. Last night she began vomiting, and reports 4-5 episodes of brown emesis. Last emesis on arrival to ED. Tried zofran and phenergan suppositories with no relief. Admits to increased urinary frequency and urgency. No dysuria or hematuria. States hx of kidney stones and crohn's, initially thought this was her crohn's until she started vomiting. No fevers. Denies vaginal bleeding or discharge. LMP 05/17/2015.  Patient is a 29 y.o. female presenting with vomiting. The history is provided by the patient and a parent.  Emesis Severity:  Moderate Duration:  1 day Number of daily episodes:  5 Emesis appearance: brown. Progression:  Unchanged Chronicity:  Recurrent Recent urination:  Increased Relieved by:  Nothing Ineffective treatments:  Antiemetics Associated symptoms: abdominal pain and diarrhea   Diarrhea:    Number of occurrences:  5   Severity:  Moderate   Duration:  1 day   Timing:  Intermittent   Progression:  Unchanged   Past Medical History  Diagnosis Date  . Crohn's disease   . Non-compliant patient    Past Surgical History  Procedure Laterality Date  . Induced abortion     History reviewed. No pertinent family history. History  Substance Use Topics  . Smoking status: Never Smoker   . Smokeless tobacco: Not on  file  . Alcohol Use: Yes     Comment: ocassionally   OB History    No data available     Review of Systems  Gastrointestinal: Positive for nausea, vomiting, abdominal pain and diarrhea.  Genitourinary: Positive for urgency and frequency.  All other systems reviewed and are negative.     Allergies  Bee venom and Ciprofloxacin  Home Medications   Prior to Admission medications   Medication Sig Start Date End Date Taking? Authorizing Provider  dicyclomine (BENTYL) 20 MG tablet Take 1 tablet (20 mg total) by mouth 2 (two) times daily. 05/25/15  Yes Waynetta Pean, PA-C  ibuprofen (ADVIL,MOTRIN) 200 MG tablet Take 400 mg by mouth every 6 (six) hours as needed for mild pain.   Yes Historical Provider, MD  ondansetron (ZOFRAN ODT) 8 MG disintegrating tablet Take 1 tablet (8 mg total) by mouth every 8 (eight) hours as needed for nausea. 05/28/15  Yes Varney Biles, MD  promethazine (PHENERGAN) 25 MG suppository Place 25 mg rectally every 6 (six) hours as needed for nausea or vomiting.   Yes Historical Provider, MD  promethazine (PHENERGAN) 25 MG tablet Take 1 tablet (25 mg total) by mouth every 6 (six) hours as needed for nausea or vomiting. 05/24/15  Yes Domenic Moras, PA-C  oxyCODONE-acetaminophen (PERCOCET/ROXICET) 5-325 MG per tablet Take 2 tablets by mouth every 4 (four) hours as needed for severe pain. Patient not taking: Reported on 05/24/2015 04/13/15   Alvina Chou, PA-C  predniSONE (DELTASONE) 20 MG tablet 3 tabs po day one, then 2 tabs daily x 4 days Patient not taking: Reported on  06/08/2015 05/24/15   Domenic Moras, PA-C  promethazine (PHENERGAN) 25 MG suppository Place 1 suppository (25 mg total) rectally every 6 (six) hours as needed for nausea or vomiting. 06/08/15   Carman Ching, PA-C  traMADol (ULTRAM) 50 MG tablet Take 1 tablet (50 mg total) by mouth every 6 (six) hours as needed. 06/08/15   Akari Defelice M Zamire Whitehurst, PA-C   BP 148/102 mmHg  Pulse 89  Temp(Src) 98.4 F (36.9 C) (Oral)  Resp  18  SpO2 99%  LMP 05/17/2015 Physical Exam  Constitutional: She is oriented to person, place, and time. She appears well-developed and well-nourished. No distress.  HENT:  Head: Normocephalic and atraumatic.  Mouth/Throat: Oropharynx is clear and moist.  Eyes: Conjunctivae and EOM are normal.  Neck: Normal range of motion. Neck supple.  Cardiovascular: Normal rate, regular rhythm and normal heart sounds.   Pulmonary/Chest: Effort normal and breath sounds normal. No respiratory distress.  Abdominal: Soft. Normal appearance and bowel sounds are normal. She exhibits no distension. There is tenderness in the right upper quadrant, right lower quadrant, suprapubic area and left lower quadrant. There is positive Murphy's sign. There is no rigidity, no rebound, no guarding and no tenderness at McBurney's point.  No peritoneal signs. + R CVAT.  Musculoskeletal: Normal range of motion. She exhibits no edema.  Neurological: She is alert and oriented to person, place, and time. No sensory deficit.  Skin: Skin is warm and dry.  Psychiatric: She has a normal mood and affect. Her behavior is normal.  Nursing note and vitals reviewed.   ED Course  Procedures (including critical care time) Labs Review Labs Reviewed  CBC - Abnormal; Notable for the following:    WBC 16.7 (*)    RBC 5.90 (*)    Hemoglobin 15.6 (*)    MCV 77.1 (*)    All other components within normal limits  COMPREHENSIVE METABOLIC PANEL - Abnormal; Notable for the following:    Sodium 134 (*)    Chloride 98 (*)    Glucose, Bld 125 (*)    Total Protein 8.7 (*)    All other components within normal limits  URINALYSIS, ROUTINE W REFLEX MICROSCOPIC (NOT AT [facility]) - Abnormal; Notable for the following:    APPearance CLOUDY (*)    Hgb urine dipstick SMALL (*)    Ketones, ur >80 (*)    Protein, ur 30 (*)    All other components within normal limits  URINE MICROSCOPIC-ADD ON - Abnormal; Notable for the following:    Bacteria, UA  MANY (*)    Casts HYALINE CASTS (*)    All other components within normal limits  LIPASE, BLOOD  POC URINE PREG, ED    Imaging Review US Abdomen Complete  06/08/2015   CLINICAL DATA:  Right flank pain, right upper quadrant pain for 3 days, Crohn's disease  EXAM: ULTRASOUND ABDOMEN COMPLETE  COMPARISON:  CT scan 05/28/2015.  FINDINGS: Gallbladder: No gallstones or wall thickening visualized. No sonographic Murphy sign noted.  Common bile duct: Diameter: 4 mm in diameter within normal limits.  Liver: No focal lesion identified. Within normal limits in parenchymal echogenicity.  IVC: No abnormality visualized.  Pancreas: Visualized portion unremarkable.  Spleen: Size and appearance within normal limits. Measures 6.2 cm in length.  Right Kidney: Length: 10 cm. Echogenicity within normal limits. No mass or hydronephrosis visualized.  Left Kidney: Length: 10.5 cm. Echogenicity within normal limits. No mass or hydronephrosis visualized.  Abdominal aorta: No aneurysm visualized. Measures up to 2  cm in diameter.  Other findings: Suboptimal study due to patient's large body habitus.  IMPRESSION: 1. No gallstones are noted within gallbladder.  Normal CBD. 2. No hydronephrosis or renal calculus. 3. No aortic aneurysm.   Electronically Signed   By: Lahoma Crocker M.D.   On: 06/08/2015 09:41   US Pelvis Limited  06/08/2015   CLINICAL DATA:  Right flank pain, right upper quadrant pain, history of Crohn's disease  EXAM: LIMITED ULTRASOUND OF PELVIS  TECHNIQUE: Limited transabdominal ultrasound examination of the urinary bladder was performed.  COMPARISON:  None.  FINDINGS: Transabdominal images of the urinary bladder submitted. The bladder shows normal contour. There is no thickening of urinary bladder wall. No bladder debris or filling defects.  IMPRESSION: Unremarkable urinary bladder.   Electronically Signed   By: Lahoma Crocker M.D.   On: 06/08/2015 09:51     EKG Interpretation None     Non-toxic appearing, NAD. AFVSS.  Abdomen is soft with no peritoneal signs. States hx of crohn's, however on chart review, pt was admitted in Feb 2016, seen by Dr. Collene Mares with GI who did not feel pt's findings on prior colonoscopies were from crohn's rather than from NSAID overuse. Will give pt IV fluids, zofran and bentyl. Pt has had 3 CT scans of her abdomen in the past 6 months. Will try to hold off on this at this time. 6:45 AM.  Labs showing leukocytosis of 16.7, UA significant for >80 ketones, many bacteria and hyaline casts. On repeat exam, pt reports only mild improvement. Given CVAT and RUQ tenderness with positive Murphy's, cannot exclude gallbladder pathology or kidney stone/pyelo. Will obtain abdominal US and renal US. Phenergan and morphine ordered. 8:20 AM.  [place] without acute finding. After receiving morphine and phenergan, pt's pain significantly improved. On re-examination, abdomen soft, with mild tenderness in RUQ. Negative Murphy's. Possible viral illness vs IBD. I still do not feel a CT is necessary at this time, as pt most likely needs a repeat colonoscopy since her last one was in 2012. Pt agreeable. Will give PO challenge and re-assess. 10:06 AM  Pt able to tolerate PO. Abdomen remains soft with mild right upper quadrant tenderness. Will discharge patient home with tramadol as she was given a prescription for Vicodin within the past 2 weeks, and a prescription for Phenergan. Discussed that she needs to follow-up with her GI specialist as soon as possible for repeat colonoscopy. Stable for discharge. MDM   Final diagnoses:  Nausea vomiting and diarrhea  RUQ abdominal pain   Return precautions given. Patient states understanding of treatment care plan and is agreeable.  Carman Ching, PA-C 06/08/15 [place], MD 06/09/15 [phone]

## 2015-06-08 NOTE — Discharge Instructions (Signed)
Take tramadol as prescribed. Take phenergan as prescribed as needed for nausea.  Diarrhea Diarrhea is frequent loose and watery bowel movements. It can cause you to feel weak and dehydrated. Dehydration can cause you to become tired and thirsty, have a dry mouth, and have decreased urination that often is dark yellow. Diarrhea is a sign of another problem, most often an infection that will not last long. In most cases, diarrhea typically lasts 2-3 days. However, it can last longer if it is a sign of something more serious. It is important to treat your diarrhea as directed by your caregiver to lessen or prevent future episodes of diarrhea. CAUSES  Some common causes include:  Gastrointestinal infections caused by viruses, bacteria, or parasites.  Food poisoning or food allergies.  Certain medicines, such as antibiotics, chemotherapy, and laxatives.  Artificial sweeteners and fructose.  Digestive disorders. HOME CARE INSTRUCTIONS  Ensure adequate fluid intake (hydration): Have 1 cup (8 oz) of fluid for each diarrhea episode. Avoid fluids that contain simple sugars or sports drinks, fruit juices, whole milk products, and sodas. Your urine should be clear or pale yellow if you are drinking enough fluids. Hydrate with an oral rehydration solution that you can purchase at pharmacies, retail stores, and online. You can prepare an oral rehydration solution at home by mixing the following ingredients together:   - tsp table salt.   tsp baking soda.   tsp salt substitute containing potassium chloride.  1  tablespoons sugar.  1 L (34 oz) of water.  Certain foods and beverages may increase the speed at which food moves through the gastrointestinal (GI) tract. These foods and beverages should be avoided and include:  Caffeinated and alcoholic beverages.  High-fiber foods, such as raw fruits and vegetables, nuts, seeds, and whole grain breads and cereals.  Foods and beverages sweetened with  sugar alcohols, such as xylitol, sorbitol, and mannitol.  Some foods may be well tolerated and may help thicken stool including:  Starchy foods, such as rice, toast, pasta, low-sugar cereal, oatmeal, grits, baked potatoes, crackers, and bagels.  Bananas.  Applesauce.  Add probiotic-rich foods to help increase healthy bacteria in the GI tract, such as yogurt and fermented milk products.  Wash your hands well after each diarrhea episode.  Only take over-the-counter or prescription medicines as directed by your caregiver.  Take a warm bath to relieve any burning or pain from frequent diarrhea episodes. SEEK IMMEDIATE MEDICAL CARE IF:   You are unable to keep fluids down.  You have persistent vomiting.  You have blood in your stool, or your stools are black and tarry.  You do not urinate in 6-8 hours, or there is only a small amount of very dark urine.  You have abdominal pain that increases or localizes.  You have weakness, dizziness, confusion, or light-headedness.  You have a severe headache.  Your diarrhea gets worse or does not get better.  You have a fever or persistent symptoms for more than 2-3 days.  You have a fever and your symptoms suddenly get worse. MAKE SURE YOU:   Understand these instructions.  Will watch your condition.  Will get help right away if you are not doing well or get worse. Document Released: 11/07/2002 Document Revised: 04/03/2014 Document Reviewed: 07/25/2012 [facility] Patient Information 2015 [place], [place]. This information is not intended to replace advice given to you by your health care provider. Make sure you discuss any questions you have with your health care provider.  Food Choices to  Help Relieve Diarrhea When you have diarrhea, the foods you eat and your eating habits are very important. Choosing the right foods and drinks can help relieve diarrhea. Also, because diarrhea can last up to 7 days, you need to replace lost fluids and  electrolytes (such as sodium, potassium, and chloride) in order to help prevent dehydration.  WHAT GENERAL GUIDELINES DO I NEED TO FOLLOW?  Slowly drink 1 cup (8 oz) of fluid for each episode of diarrhea. If you are getting enough fluid, your urine will be clear or pale yellow.  Eat starchy foods. Some good choices include white rice, white toast, pasta, low-fiber cereal, baked potatoes (without the skin), saltine crackers, and bagels.  Avoid large servings of any cooked vegetables.  Limit fruit to two servings per day. A serving is  cup or 1 small piece.  Choose foods with less than 2 g of fiber per serving.  Limit fats to less than 8 tsp (38 g) per day.  Avoid fried foods.  Eat foods that have probiotics in them. Probiotics can be found in certain dairy products.  Avoid foods and beverages that may increase the speed at which food moves through the stomach and intestines (gastrointestinal tract). Things to avoid include:  High-fiber foods, such as dried fruit, raw fruits and vegetables, nuts, seeds, and whole grain foods.  Spicy foods and high-fat foods.  Foods and beverages sweetened with high-fructose corn syrup, honey, or sugar alcohols such as xylitol, sorbitol, and mannitol. WHAT FOODS ARE RECOMMENDED? Grains White rice. White, [place], or pita breads (fresh or toasted), including plain rolls, buns, or bagels. White pasta. Saltine, soda, or graham crackers. Pretzels. Low-fiber cereal. Cooked cereals made with water (such as cornmeal, farina, or cream cereals). Plain muffins. Matzo. Melba toast. Zwieback.  Vegetables Potatoes (without the skin). Strained tomato and vegetable juices. Most well-cooked and canned vegetables without seeds. Tender lettuce. Fruits Cooked or canned applesauce, apricots, cherries, fruit cocktail, grapefruit, peaches, pears, or plums. Fresh bananas, apples without skin, cherries, grapes, cantaloupe, grapefruit, peaches, oranges, or plums.  Meat and  Other Protein Products Baked or boiled chicken. Eggs. Tofu. Fish. Seafood. Smooth peanut butter. Ground or well-cooked tender beef, ham, veal, lamb, pork, or poultry.  Dairy Plain yogurt, kefir, and unsweetened liquid yogurt. Lactose-free milk, buttermilk, or soy milk. Plain hard cheese. Beverages Sport drinks. Clear broths. Diluted fruit juices (except prune). Regular, caffeine-free sodas such as ginger ale. Water. Decaffeinated teas. Oral rehydration solutions. Sugar-free beverages not sweetened with sugar alcohols. Other Bouillon, broth, or soups made from recommended foods.  The items listed above may not be a complete list of recommended foods or beverages. Contact your dietitian for more options. WHAT FOODS ARE NOT RECOMMENDED? Grains Whole grain, whole wheat, bran, or rye breads, rolls, pastas, crackers, and cereals. Wild or brown rice. Cereals that contain more than 2 g of fiber per serving. Corn tortillas or taco shells. Cooked or dry oatmeal. Granola. Popcorn. Vegetables Raw vegetables. Cabbage, broccoli, Brussels sprouts, artichokes, baked beans, beet greens, corn, kale, legumes, peas, sweet potatoes, and yams. Potato skins. Cooked spinach and cabbage. Fruits Dried fruit, including raisins and dates. Raw fruits. Stewed or dried prunes. Fresh apples with skin, apricots, mangoes, pears, raspberries, and strawberries.  Meat and Other Protein Products Chunky peanut butter. Nuts and seeds. Beans and lentils. Berniece Salines.  Dairy High-fat cheeses. Milk, chocolate milk, and beverages made with milk, such as milk shakes. Cream. Ice cream. Sweets and Desserts Sweet rolls, doughnuts, and sweet breads. Pancakes and waffles.  Fats and Oils Butter. Cream sauces. Margarine. Salad oils. Plain salad dressings. Olives. Avocados.  Beverages Caffeinated beverages (such as coffee, tea, soda, or energy drinks). Alcoholic beverages. Fruit juices with pulp. Prune juice. Soft drinks sweetened with high-fructose  corn syrup or sugar alcohols. Other Coconut. Hot sauce. Chili powder. Mayonnaise. Gravy. Cream-based or milk-based soups.  The items listed above may not be a complete list of foods and beverages to avoid. Contact your dietitian for more information. WHAT SHOULD I DO IF I BECOME DEHYDRATED? Diarrhea can sometimes lead to dehydration. Signs of dehydration include dark urine and dry mouth and skin. If you think you are dehydrated, you should rehydrate with an oral rehydration solution. These solutions can be purchased at pharmacies, retail stores, or online.  Drink -1 cup (120-240 mL) of oral rehydration solution each time you have an episode of diarrhea. If drinking this amount makes your diarrhea worse, try drinking smaller amounts more often. For example, drink 1-3 tsp (5-15 mL) every 5-10 minutes.  A general rule for staying hydrated is to drink 1-2 L of fluid per day. Talk to your health care provider about the specific amount you should be drinking each day. Drink enough fluids to keep your urine clear or pale yellow. Document Released: 02/07/2004 Document Revised: 11/22/2013 Document Reviewed: 10/10/2013 [facility] Patient Information 2015 [place], [place]. This information is not intended to replace advice given to you by your health care provider. Make sure you discuss any questions you have with your health care provider.  Nausea and Vomiting Nausea is a sick feeling that often comes before throwing up (vomiting). Vomiting is a reflex where stomach contents come out of your mouth. Vomiting can cause severe loss of body fluids (dehydration). Children and elderly adults can become dehydrated quickly, especially if they also have diarrhea. Nausea and vomiting are symptoms of a condition or disease. It is important to find the cause of your symptoms. CAUSES   Direct irritation of the stomach lining. This irritation can result from increased acid production (gastroesophageal reflux disease),  infection, food poisoning, taking certain medicines (such as nonsteroidal anti-inflammatory drugs), alcohol use, or tobacco use.  Signals from the brain.These signals could be caused by a headache, heat exposure, an inner ear disturbance, increased pressure in the brain from injury, infection, a tumor, or a concussion, pain, emotional stimulus, or metabolic problems.  An obstruction in the gastrointestinal tract (bowel obstruction).  Illnesses such as diabetes, hepatitis, gallbladder problems, appendicitis, kidney problems, cancer, sepsis, atypical symptoms of a heart attack, or eating disorders.  Medical treatments such as chemotherapy and radiation.  Receiving medicine that makes you sleep (general anesthetic) during surgery. DIAGNOSIS Your caregiver may ask for tests to be done if the problems do not improve after a few days. Tests may also be done if symptoms are severe or if the reason for the nausea and vomiting is not clear. Tests may include:  Urine tests.  Blood tests.  Stool tests.  Cultures (to look for evidence of infection).  X-rays or other imaging studies. Test results can help your caregiver make decisions about treatment or the need for additional tests. TREATMENT You need to stay well hydrated. Drink frequently but in small amounts.You may wish to drink water, sports drinks, clear broth, or eat frozen ice pops or gelatin dessert to help stay hydrated.When you eat, eating slowly may help prevent nausea.There are also some antinausea medicines that may help prevent nausea. HOME CARE INSTRUCTIONS   Take all medicine as directed by  your caregiver.  If you do not have an appetite, do not force yourself to eat. However, you must continue to drink fluids.  If you have an appetite, eat a normal diet unless your caregiver tells you differently.  Eat a variety of complex carbohydrates (rice, wheat, potatoes, bread), lean meats, yogurt, fruits, and vegetables.  Avoid  high-fat foods because they are more difficult to digest.  Drink enough water and fluids to keep your urine clear or pale yellow.  If you are dehydrated, ask your caregiver for specific rehydration instructions. Signs of dehydration may include:  Severe thirst.  Dry lips and mouth.  Dizziness.  Dark urine.  Decreasing urine frequency and amount.  Confusion.  Rapid breathing or pulse. SEEK IMMEDIATE MEDICAL CARE IF:   You have blood or brown flecks (like coffee grounds) in your vomit.  You have black or bloody stools.  You have a severe headache or stiff neck.  You are confused.  You have severe abdominal pain.  You have chest pain or trouble breathing.  You do not urinate at least once every 8 hours.  You develop cold or clammy skin.  You continue to vomit for longer than 24 to 48 hours.  You have a fever. MAKE SURE YOU:   Understand these instructions.  Will watch your condition.  Will get help right away if you are not doing well or get worse. Document Released: 11/17/2005 Document Revised: 02/09/2012 Document Reviewed: 04/16/2011 [facility] Patient Information 2015 [place], [place]. This information is not intended to replace advice given to you by your health care provider. Make sure you discuss any questions you have with your health care provider.

## 2016-01-21 ENCOUNTER — Emergency Department (HOSPITAL_COMMUNITY): Payer: 59

## 2016-01-21 ENCOUNTER — Emergency Department (HOSPITAL_COMMUNITY)
Admission: EM | Admit: 2016-01-21 | Discharge: 2016-01-21 | Disposition: A | Discharge status: Home / Self Care | Payer: 59 | Attending: Emergency Medicine | Admitting: Emergency Medicine

## 2016-01-21 ENCOUNTER — Encounter (HOSPITAL_COMMUNITY): Payer: Self-pay | Admitting: Emergency Medicine

## 2016-01-21 DIAGNOSIS — X501XXA Overexertion from prolonged static or awkward postures, initial encounter: Secondary | ICD-10-CM | POA: Insufficient documentation

## 2016-01-21 DIAGNOSIS — Y9389 Activity, other specified: Secondary | ICD-10-CM | POA: Insufficient documentation

## 2016-01-21 DIAGNOSIS — T148XXA Other injury of unspecified body region, initial encounter: Secondary | ICD-10-CM

## 2016-01-21 DIAGNOSIS — S29002A Unspecified injury of muscle and tendon of back wall of thorax, initial encounter: Secondary | ICD-10-CM | POA: Diagnosis not present

## 2016-01-21 DIAGNOSIS — Y998 Other external cause status: Secondary | ICD-10-CM | POA: Diagnosis not present

## 2016-01-21 DIAGNOSIS — Z79899 Other long term (current) drug therapy: Secondary | ICD-10-CM | POA: Diagnosis not present

## 2016-01-21 DIAGNOSIS — R531 Weakness: Secondary | ICD-10-CM | POA: Diagnosis not present

## 2016-01-21 DIAGNOSIS — Z8719 Personal history of other diseases of the digestive system: Secondary | ICD-10-CM | POA: Diagnosis not present

## 2016-01-21 DIAGNOSIS — R42 Dizziness and giddiness: Secondary | ICD-10-CM | POA: Diagnosis not present

## 2016-01-21 DIAGNOSIS — G5601 Carpal tunnel syndrome, right upper limb: Secondary | ICD-10-CM | POA: Diagnosis not present

## 2016-01-21 DIAGNOSIS — T148 Other injury of unspecified body region: Secondary | ICD-10-CM | POA: Diagnosis not present

## 2016-01-21 DIAGNOSIS — R05 Cough: Secondary | ICD-10-CM | POA: Insufficient documentation

## 2016-01-21 DIAGNOSIS — R0602 Shortness of breath: Secondary | ICD-10-CM | POA: Diagnosis not present

## 2016-01-21 DIAGNOSIS — R110 Nausea: Secondary | ICD-10-CM | POA: Insufficient documentation

## 2016-01-21 DIAGNOSIS — Z9119 Patient's noncompliance with other medical treatment and regimen: Secondary | ICD-10-CM | POA: Insufficient documentation

## 2016-01-21 DIAGNOSIS — S29001A Unspecified injury of muscle and tendon of front wall of thorax, initial encounter: Secondary | ICD-10-CM | POA: Insufficient documentation

## 2016-01-21 DIAGNOSIS — R0789 Other chest pain: Secondary | ICD-10-CM

## 2016-01-21 DIAGNOSIS — Y9289 Other specified places as the place of occurrence of the external cause: Secondary | ICD-10-CM | POA: Diagnosis not present

## 2016-01-21 LAB — CBC WITH DIFFERENTIAL/PLATELET
Basophils Absolute: 0.1 10*3/uL (ref 0.0–0.1)
Basophils Relative: 1 %
EOS ABS: 0.4 10*3/uL (ref 0.0–0.7)
EOS PCT: 4 %
HCT: 40.9 % (ref 36.0–46.0)
Hemoglobin: 13.6 g/dL (ref 12.0–15.0)
LYMPHS ABS: 2.5 10*3/uL (ref 0.7–4.0)
Lymphocytes Relative: 25 %
MCH: 26.4 pg (ref 26.0–34.0)
MCHC: 33.3 g/dL (ref 30.0–36.0)
MCV: 79.4 fL (ref 78.0–100.0)
MONO ABS: 0.4 10*3/uL (ref 0.1–1.0)
MONOS PCT: 4 %
Neutro Abs: 6.6 10*3/uL (ref 1.7–7.7)
Neutrophils Relative %: 66 %
PLATELETS: 364 10*3/uL (ref 150–400)
RBC: 5.15 MIL/uL — ABNORMAL HIGH (ref 3.87–5.11)
RDW: 15.2 % (ref 11.5–15.5)
WBC: 10 10*3/uL (ref 4.0–10.5)

## 2016-01-21 LAB — BASIC METABOLIC PANEL
Anion gap: 8 (ref 5–15)
BUN: 11 mg/dL (ref 6–20)
CALCIUM: 8.4 mg/dL — AB (ref 8.9–10.3)
CO2: 22 mmol/L (ref 22–32)
CREATININE: 0.76 mg/dL (ref 0.44–1.00)
Chloride: 105 mmol/L (ref 101–111)
GFR calc Af Amer: >60 mL/min (ref >60)
GFR calc non Af Amer: >60 mL/min (ref >60)
Glucose, Bld: 88 mg/dL (ref 65–99)
Potassium: 4 mmol/L (ref 3.5–5.1)
SODIUM: 135 mmol/L (ref 135–145)

## 2016-01-21 LAB — TROPONIN I: Troponin I: <0.03 ng/mL (ref <0.031)

## 2016-01-21 MED ORDER — IBUPROFEN 800 MG PO TABS
800.0000 mg | ORAL_TABLET | Freq: Once | ORAL | Status: AC
  Administered 2016-01-21: 800 mg via ORAL
  Filled 2016-01-21: qty 1

## 2016-01-21 MED ORDER — IBUPROFEN 800 MG PO TABS: 800.0000 mg | ORAL_TABLET | Freq: Three times a day (TID) | ORAL | Status: DC

## 2016-01-21 MED ORDER — CYCLOBENZAPRINE HCL 10 MG PO TABS: 10.0000 mg | ORAL_TABLET | Freq: Two times a day (BID) | ORAL | Status: DC | PRN

## 2016-01-21 NOTE — ED Provider Notes (Signed)
CSN: [id number]     Arrival date & time 01/21/16  1136 History  By signing my name below, I, Rayna Sexton, attest that this documentation has been prepared under the direction and in the presence of Nona Dell, [place]. Electronically Signed: Rayna Sexton, ED Scribe. 01/21/2016. 12:47 PM.   Chief Complaint  Patient presents with  . Back Pain  . Chest Wall Pain    The history is provided by the patient. No language interpreter was used.    HPI Comments: Frances Mccormick is a 30 y.o. female with a PHMx of Crohn's Disease who presents to the Emergency Department complaining of constant waxing and waning, mildly sharp, right sided CP onset 3 days ago. She was walking up the steps at work and began noticing the pain, stating that it worsens with deep breathing or palpation. She reports associated, mild, sharp, central back pain that presents when bending or twisting her torso, mild SOB upon exertion due to pain, nausea, lightheadedness and constant, mild, right 1st, 2nd, 3rd digit pain and paraesthesia that radiates up her right arm with intermittent right hand weakness. She took Excedrin yesterday and Tylenol Multi-Symptom, believing she might have a cold with a nonproductive cough she has had for the past few days, which provided short term relief of her pain. Pt denies any hx of similar symptoms, recent heavy lifting and any recent exacerbations of her Crohn's Disease. She works in an office typing throughout the day and began a new aerobics program 3 weeks ago. She denies fevers, chills, hemoptysis, wheezing, abd pain, vomiting, dizziness or any other associated symptoms at this time.     Past Medical History  Diagnosis Date  . Crohn's disease ([place])   . Non-compliant patient    Past Surgical History  Procedure Laterality Date  . Induced abortion     No family history on file. Social History  Substance Use Topics  . Smoking status: Never Smoker   . Smokeless tobacco: None  .  Alcohol Use: Yes     Comment: ocassionally   OB History    No data available     Review of Systems  Constitutional: Negative for fever and chills.  Respiratory: Positive for shortness of breath (intermittent with exertion). Negative for wheezing.   Cardiovascular: Positive for chest pain.  Gastrointestinal: Positive for nausea. Negative for vomiting and abdominal pain.  Musculoskeletal: Positive for myalgias, back pain and arthralgias.  Neurological: Positive for weakness (intermittent) and light-headedness. Negative for dizziness.    Allergies  Bee venom and Ciprofloxacin  Home Medications   Prior to Admission medications   Medication Sig Start Date End Date Taking? Authorizing Provider  cyclobenzaprine (FLEXERIL) 10 MG tablet Take 1 tablet (10 mg total) by mouth 2 (two) times daily as needed for muscle spasms. 01/21/16   Nona Dell, PA-C  dicyclomine (BENTYL) 20 MG tablet Take 1 tablet (20 mg total) by mouth 2 (two) times daily. 05/25/15   Waynetta Pean, PA-C  ibuprofen (ADVIL,MOTRIN) 800 MG tablet Take 1 tablet (800 mg total) by mouth 3 (three) times daily. 01/21/16   Nona Dell, PA-C  ondansetron (ZOFRAN ODT) 8 MG disintegrating tablet Take 1 tablet (8 mg total) by mouth every 8 (eight) hours as needed for nausea. 05/28/15   Varney Biles, MD  oxyCODONE-acetaminophen (PERCOCET/ROXICET) 5-325 MG per tablet Take 2 tablets by mouth every 4 (four) hours as needed for severe pain. Patient not taking: Reported on 05/24/2015 04/13/15   Alvina Chou, PA-C  predniSONE (  DELTASONE) 20 MG tablet 3 tabs po day one, then 2 tabs daily x 4 days Patient not taking: Reported on 06/08/2015 05/24/15   Domenic Moras, PA-C  promethazine (PHENERGAN) 25 MG suppository Place 25 mg rectally every 6 (six) hours as needed for nausea or vomiting.    Historical Provider, MD  promethazine (PHENERGAN) 25 MG suppository Place 1 suppository (25 mg total) rectally every 6 (six) hours as needed  for nausea or vomiting. 06/08/15   Carman Ching, PA-C  promethazine (PHENERGAN) 25 MG tablet Take 1 tablet (25 mg total) by mouth every 6 (six) hours as needed for nausea or vomiting. 05/24/15   Domenic Moras, PA-C  traMADol (ULTRAM) 50 MG tablet Take 1 tablet (50 mg total) by mouth every 6 (six) hours as needed. 06/08/15   Robyn M Hess, PA-C   BP 129/80 mmHg  Pulse 73  Temp(Src) 98.1 F (36.7 C) (Oral)  Resp 16  SpO2 100%  LMP 01/11/2016    Physical Exam  Constitutional: She is oriented to person, place, and time. She appears well-developed and well-nourished.  HENT:  Head: Normocephalic and atraumatic.  Mouth/Throat: Uvula is midline and mucous membranes are normal. No oropharyngeal exudate.  Eyes: Conjunctivae and EOM are normal. Right eye exhibits no discharge. Left eye exhibits no discharge. No scleral icterus.  Neck: Normal range of motion. Neck supple.  Cardiovascular: Normal rate, regular rhythm, normal heart sounds and intact distal pulses.  Exam reveals no gallop and no friction rub.   No murmur heard. Pulmonary/Chest: Effort normal and breath sounds normal. No respiratory distress. She has no wheezes. She has no rales. She exhibits tenderness (diffuse anterior chest wall mildly tender).  Abdominal: Soft. She exhibits no distension.  Musculoskeletal: Normal range of motion. She exhibits tenderness.  TTP over bilateral rhomboids and upper trapezius, no C/T/L midline tenderness; FROM of neck and back; FROM of bilateral UE and LE with 5/5 strength; sensation grossly intact; 2+ radial and PT pulses; cap refill <2 seconds; equal grip strength bilaterally; positive Tinel's sign right side  Lymphadenopathy:    She has no cervical adenopathy.  Neurological: She is alert and oriented to person, place, and time.  Skin: Skin is warm and dry.  Psychiatric: She has a normal mood and affect.  Nursing note and vitals reviewed.   ED Course  Procedures  DIAGNOSTIC STUDIES: Oxygen Saturation is  100% on RA, normal by my interpretation.    COORDINATION OF CARE: 12:33 PM Discussed next steps with pt including EKG, CXR, ibuprofen, CBC, BMP and I-stat troponin. Pt agreed to plan.  Labs Review Labs Reviewed  CBC WITH DIFFERENTIAL/PLATELET - Abnormal; Notable for the following:    RBC 5.15 (*)    All other components within normal limits  BASIC METABOLIC PANEL - Abnormal; Notable for the following:    Calcium 8.4 (*)    All other components within normal limits  TROPONIN I  Randolm Idol, ED    Imaging Review Dg Chest 2 View  01/21/2016  CLINICAL DATA:  30 year old female with chest pain EXAM: CHEST  2 VIEW COMPARISON:  Radiograph dated 01/04/2014 FINDINGS: The heart size and mediastinal contours are within normal limits. Both lungs are clear. The visualized skeletal structures are unremarkable. IMPRESSION: No active cardiopulmonary disease. Electronically Signed   By: Anner Crete M.D.   On: 01/21/2016 13:54   I have personally reviewed and evaluated these images and lab results as part of my medical decision-making.   EKG Interpretation None  MDM   Final diagnoses:  Chest wall pain  Carpal tunnel syndrome of right wrist  Muscle strain    Patient presents with constant chest pain x3days. She notes she has had cold-like symptoms with a nonproductive cough for the past 2 days. Cough worse with coughing or deep breathing. VSS. Exam revealed mild tenderness with palpation over anterior chest wall. EKG showed normal sinus rhythm. Troponin negative. Chest x-ray negative. Labs unremarkable. I have a low suspicion for ACS, PE, dissection, or other acute cardiac event at this time. I suspect patient's chest pain is likely due to musculoskeletal etiology associated with cough or recent cold. Plan to discharge patient home with NSAIDs and symptomatic treatment.  Patient also presents with back pain, endorses starting a new aerobics class a few weeks ago. Denies any recent  fall, injury. No back pain red flags. Exam revealed mild tenderness over rhomboids and upper trapezius, no neuro deficits. I suspect patient's symptoms are likely due to muscle strain. Plan to discharge patient home with symptomatic treatment.  Patient also presents with numbness and weakness to her right hand. Numbness consistent with median nerve distribution. Positive Tinel's sign on exam. Symptoms consistent with carpal tunnel syndrome. Patient given wrist splint in the ED. Advised patient to wear the wrist: At night and discussed symptomatic treatment.  Evaluation does not show pathology requring ongoing emergent intervention or admission. Pt is hemodynamically stable and mentating appropriately. Discussed findings/results and plan with patient/guardian, who agrees with plan. All questions answered. Return precautions discussed and outpatient follow up given.      I personally performed the services described in this documentation, which was scribed in my presence. The recorded information has been reviewed and is accurate.    Chesley Noon [place], [place] 01/21/16 [place], MD 01/23/16 [phone]

## 2016-01-21 NOTE — ED Notes (Signed)
Pt c/o central back pain and chest wall pain x 3 days. Pt sts the pain worsens with movement, deep breaths, and when laying on back. Pt also c/o intermittent shooting R side pain in leg all the way up to her face. A&Ox4 and ambulatory. Pt has experienced this pain in the past and sts it tends to resolve on it's own. Pt denies injury. Denies dizziness, c/o intermittent nausea when the pain comes.

## 2016-01-21 NOTE — Discharge Instructions (Signed)
Take your medications as prescribed. I also recommend applying ice to affected area for 15-20 minutes 3-4 times daily to help with pain. Refrain from doing any heavy lifting, excessive exercising or repetitive movements that exacerbate your symptoms. I recommend wearing your wrist splint while sleeping at night, you may also wear the splint during the day for symptomatic relief.  Follow-up with your primary care provider in 4-5 days. Please return to the Emergency Department if symptoms worsen or new onset of fever, headache, coughing up blood, shortness of breath, chest pain, numbness, tingling, weakness.

## 2016-04-22 ENCOUNTER — Encounter (HOSPITAL_COMMUNITY): Payer: Self-pay

## 2016-04-22 ENCOUNTER — Ambulatory Visit (HOSPITAL_COMMUNITY)
Admission: EM | Admit: 2016-04-22 | Discharge: 2016-04-22 | Disposition: A | Discharge status: Home / Self Care | Payer: No Typology Code available for payment source | Attending: Family Medicine | Admitting: Family Medicine

## 2016-04-22 DIAGNOSIS — J329 Chronic sinusitis, unspecified: Secondary | ICD-10-CM | POA: Diagnosis not present

## 2016-04-22 DIAGNOSIS — J40 Bronchitis, not specified as acute or chronic: Secondary | ICD-10-CM | POA: Diagnosis not present

## 2016-04-22 MED ORDER — AMOXICILLIN 500 MG PO CAPS: 500.0000 mg | ORAL_CAPSULE | Freq: Three times a day (TID) | ORAL | Status: DC

## 2016-04-22 NOTE — ED Notes (Signed)
Pt presents with cough and nasal congestion associated w/chest pain x5 days, she has been taking Tylenol cold/flu to treat symptoms No acute distress

## 2016-04-22 NOTE — ED Provider Notes (Signed)
CSN: [id number]     Arrival date & time 04/22/16  1904 History   First MD Initiated Contact with Patient 04/22/16 1927     Chief Complaint  Patient presents with  . Cough  . Nasal Congestion   (Consider location/radiation/quality/duration/timing/severity/associated sxs/prior Treatment) HPI History obtained from patient:  Pt presents with the cc of: Upper respiratory symptoms Duration of symptoms: 7-8 days Treatment prior to arrival: Over-the-counter medicines without relief of symptoms Context: Sudden onset of upper respiratory symptoms with cough congestion and postnasal drainage Other symptoms include: As noted above Pain score: 2) chest pain with cough) FAMILY HISTORY: No history of cancer    Past Medical History  Diagnosis Date  . Crohn's disease ([place])   . Non-compliant patient    Past Surgical History  Procedure Laterality Date  . Induced abortion     No family history on file. Social History  Substance Use Topics  . Smoking status: Never Smoker   . Smokeless tobacco: Never Used  . Alcohol Use: Yes     Comment: ocassionally   OB History    No data available     Review of Systems  Denies: HEADACHE, NAUSEA, ABDOMINAL PAIN, CHEST PAIN, CONGESTION, DYSURIA, SHORTNESS OF BREATH  Allergies  Bee venom and Ciprofloxacin  Home Medications   Prior to Admission medications   Medication Sig Start Date End Date Taking? Authorizing Provider  amoxicillin (AMOXIL) 500 MG capsule Take 1 capsule (500 mg total) by mouth 3 (three) times daily. 04/22/16   Konrad Felix, PA  cyclobenzaprine (FLEXERIL) 10 MG tablet Take 1 tablet (10 mg total) by mouth 2 (two) times daily as needed for muscle spasms. 01/21/16   Nona Dell, PA-C  dicyclomine (BENTYL) 20 MG tablet Take 1 tablet (20 mg total) by mouth 2 (two) times daily. 05/25/15   Waynetta Pean, PA-C  ibuprofen (ADVIL,MOTRIN) 800 MG tablet Take 1 tablet (800 mg total) by mouth 3 (three) times daily. 01/21/16   Nona Dell, PA-C  ondansetron (ZOFRAN ODT) 8 MG disintegrating tablet Take 1 tablet (8 mg total) by mouth every 8 (eight) hours as needed for nausea. 05/28/15   Varney Biles, MD  oxyCODONE-acetaminophen (PERCOCET/ROXICET) 5-325 MG per tablet Take 2 tablets by mouth every 4 (four) hours as needed for severe pain. Patient not taking: Reported on 05/24/2015 04/13/15   Alvina Chou, PA-C  predniSONE (DELTASONE) 20 MG tablet 3 tabs po day one, then 2 tabs daily x 4 days Patient not taking: Reported on 06/08/2015 05/24/15   Domenic Moras, PA-C  promethazine (PHENERGAN) 25 MG suppository Place 25 mg rectally every 6 (six) hours as needed for nausea or vomiting.    Historical Provider, MD  promethazine (PHENERGAN) 25 MG suppository Place 1 suppository (25 mg total) rectally every 6 (six) hours as needed for nausea or vomiting. 06/08/15   Carman Ching, PA-C  promethazine (PHENERGAN) 25 MG tablet Take 1 tablet (25 mg total) by mouth every 6 (six) hours as needed for nausea or vomiting. 05/24/15   Domenic Moras, PA-C  traMADol (ULTRAM) 50 MG tablet Take 1 tablet (50 mg total) by mouth every 6 (six) hours as needed. 06/08/15   Carman Ching, PA-C   Meds Ordered and Administered this Visit  Medications - No data to display  BP 121/86 mmHg  Pulse 71  Temp(Src) 98.3 F (36.8 C) (Oral)  Resp 16  SpO2 96%  LMP 04/22/2016 (Exact Date) No data found.   Physical Exam NURSES NOTES AND VITAL  SIGNS REVIEWED. CONSTITUTIONAL: Well developed, well nourished, no acute distress HEENT: normocephalic, atraumatic, tender maxillary sinus mostly on the right with postnasal drainage noted. Yellow in color EYES: Conjunctiva normal NECK:normal ROM, supple, no adenopathy PULMONARY:No respiratory distress, normal effort ABDOMINAL: Soft, ND, NT BS+, No CVAT MUSCULOSKELETAL: Normal ROM of all extremities,  SKIN: warm and dry without rash PSYCHIATRIC: Mood and affect, behavior are normal  ED Course  Procedures (including  critical care time)  Labs Review Labs Reviewed - No data to display  Imaging Review No results found.   Visual Acuity Review  Right Eye Distance:   Left Eye Distance:   Bilateral Distance:    Right Eye Near:   Left Eye Near:    Bilateral Near:     Prescription for Amoxil  MDM   1. Sinobronchitis     Patient is reassured that there are no issues that require transfer to higher level of care at this time or additional tests. Patient is advised to continue home symptomatic treatment. Patient is advised that if there are new or worsening symptoms to attend the emergency department, contact primary care provider, or return to UC. Instructions of care provided discharged home in stable condition.    THIS NOTE WAS GENERATED USING A VOICE RECOGNITION SOFTWARE PROGRAM. ALL REASONABLE EFFORTS  WERE MADE TO PROOFREAD THIS DOCUMENT FOR ACCURACY.  I have verbally reviewed the discharge instructions with the patient. A printed AVS was given to the patient.  All questions were answered prior to discharge.      Konrad Felix, [place] 04/22/16 2026

## 2016-04-22 NOTE — ED Notes (Signed)
Patient discharged by Frank Patrick, PA 

## 2016-04-22 NOTE — Discharge Instructions (Signed)
Sinusitis, Adult  Sinusitis is redness, soreness, and puffiness (inflammation) of the air pockets in the bones of your face (sinuses). The redness, soreness, and puffiness can cause air and mucus to get trapped in your sinuses. This can allow germs to grow and cause an infection.   HOME CARE    Drink enough fluids to keep your pee (urine) clear or pale yellow.   Use a humidifier in your home.   Run a hot shower to create steam in the bathroom. Sit in the bathroom with the door closed. Breathe in the steam 3-4 times a day.   Put a warm, moist washcloth on your face 3-4 times a day, or as told by your doctor.   Use salt water sprays (saline sprays) to wet the thick fluid in your nose. This can help the sinuses drain.   Only take medicine as told by your doctor.  GET HELP RIGHT AWAY IF:    Your pain gets worse.   You have very bad headaches.   You are sick to your stomach (nauseous).   You throw up (vomit).   You are very sleepy (drowsy) all the time.   Your face is puffy (swollen).   Your vision changes.   You have a stiff neck.   You have trouble breathing.  MAKE SURE YOU:    Understand these instructions.   Will watch your condition.   Will get help right away if you are not doing well or get worse.     This information is not intended to replace advice given to you by your health care provider. Make sure you discuss any questions you have with your health care provider.     Document Released: 05/05/2008 Document Revised: 12/08/2014 Document Reviewed: 06/22/2012  Elsevier Interactive Patient Education 2016 [employer]

## 2016-12-09 ENCOUNTER — Encounter (HOSPITAL_COMMUNITY): Payer: Self-pay | Admitting: Emergency Medicine

## 2016-12-09 ENCOUNTER — Emergency Department (HOSPITAL_COMMUNITY): Payer: Self-pay

## 2016-12-09 ENCOUNTER — Emergency Department (HOSPITAL_COMMUNITY)
Admission: EM | Admit: 2016-12-09 | Discharge: 2016-12-09 | Disposition: A | Discharge status: Home / Self Care | Payer: Self-pay | Attending: Physician Assistant | Admitting: Physician Assistant

## 2016-12-09 DIAGNOSIS — R112 Nausea with vomiting, unspecified: Secondary | ICD-10-CM | POA: Insufficient documentation

## 2016-12-09 DIAGNOSIS — R109 Unspecified abdominal pain: Secondary | ICD-10-CM

## 2016-12-09 DIAGNOSIS — Z79899 Other long term (current) drug therapy: Secondary | ICD-10-CM | POA: Insufficient documentation

## 2016-12-09 DIAGNOSIS — R197 Diarrhea, unspecified: Secondary | ICD-10-CM | POA: Insufficient documentation

## 2016-12-09 LAB — URINALYSIS, ROUTINE W REFLEX MICROSCOPIC
Bilirubin Urine: NEGATIVE
Glucose, UA: NEGATIVE mg/dL
Hgb urine dipstick: NEGATIVE
Ketones, ur: NEGATIVE mg/dL
LEUKOCYTES UA: NEGATIVE
NITRITE: NEGATIVE
PROTEIN: NEGATIVE mg/dL
Specific Gravity, Urine: 1.01 (ref 1.005–1.030)
pH: 8 (ref 5.0–8.0)

## 2016-12-09 LAB — CBC WITH DIFFERENTIAL/PLATELET
BASOS ABS: 0 10*3/uL (ref 0.0–0.1)
BASOS PCT: 0 %
EOS PCT: 0 %
Eosinophils Absolute: 0.1 10*3/uL (ref 0.0–0.7)
HCT: 33.8 % — ABNORMAL LOW (ref 36.0–46.0)
Hemoglobin: 11.3 g/dL — ABNORMAL LOW (ref 12.0–15.0)
LYMPHS PCT: 6 %
Lymphs Abs: 1 10*3/uL (ref 0.7–4.0)
MCH: 26.2 pg (ref 26.0–34.0)
MCHC: 33.4 g/dL (ref 30.0–36.0)
MCV: 78.2 fL (ref 78.0–100.0)
MONO ABS: 0.8 10*3/uL (ref 0.1–1.0)
Monocytes Relative: 4 %
Neutro Abs: 16.6 10*3/uL — ABNORMAL HIGH (ref 1.7–7.7)
Neutrophils Relative %: 90 %
PLATELETS: 268 10*3/uL (ref 150–400)
RBC: 4.32 MIL/uL (ref 3.87–5.11)
RDW: 15.3 % (ref 11.5–15.5)
WBC: 18.5 10*3/uL — ABNORMAL HIGH (ref 4.0–10.5)

## 2016-12-09 LAB — COMPREHENSIVE METABOLIC PANEL
ALBUMIN: 3.7 g/dL (ref 3.5–5.0)
ALT: 6 U/L — ABNORMAL LOW (ref 14–54)
ALT: 11 U/L — ABNORMAL LOW (ref 14–54)
ANION GAP: 7 (ref 5–15)
AST: 15 U/L (ref 15–41)
AST: 17 U/L (ref 15–41)
Albumin: 2.8 g/dL — ABNORMAL LOW (ref 3.5–5.0)
Alkaline Phosphatase: 43 U/L (ref 38–126)
Alkaline Phosphatase: 59 U/L (ref 38–126)
Anion gap: 7 (ref 5–15)
BUN: 8 mg/dL (ref 6–20)
BUN: 7 mg/dL (ref 6–20)
CHLORIDE: 115 mmol/L — AB (ref 101–111)
CO2: 19 mmol/L — ABNORMAL LOW (ref 22–32)
CO2: 24 mmol/L (ref 22–32)
Calcium: 5.6 mg/dL — CL (ref 8.9–10.3)
Calcium: 8.5 mg/dL — ABNORMAL LOW (ref 8.9–10.3)
Chloride: 107 mmol/L (ref 101–111)
Creatinine, Ser: 0.34 mg/dL — ABNORMAL LOW (ref 0.44–1.00)
Creatinine, Ser: 0.68 mg/dL (ref 0.44–1.00)
GFR calc Af Amer: >60 mL/min (ref >60)
GFR calc non Af Amer: >60 mL/min (ref >60)
GLUCOSE: 95 mg/dL (ref 65–99)
Glucose, Bld: 86 mg/dL (ref 65–99)
POTASSIUM: 2.6 mmol/L — AB (ref 3.5–5.1)
POTASSIUM: 3.9 mmol/L (ref 3.5–5.1)
Sodium: 141 mmol/L (ref 135–145)
Sodium: 138 mmol/L (ref 135–145)
Total Bilirubin: 0.3 mg/dL (ref 0.3–1.2)
Total Bilirubin: 0.4 mg/dL (ref 0.3–1.2)
Total Protein: 5.5 g/dL — ABNORMAL LOW (ref 6.5–8.1)
Total Protein: 6.9 g/dL (ref 6.5–8.1)

## 2016-12-09 LAB — I-STAT BETA HCG BLOOD, ED (MC, WL, AP ONLY)

## 2016-12-09 LAB — LIPASE, BLOOD: LIPASE: 19 U/L (ref 11–51)

## 2016-12-09 MED ORDER — SODIUM CHLORIDE 0.9 % IV SOLN
2.0000 g | Freq: Once | INTRAVENOUS | Status: AC
  Administered 2016-12-09: 2 g via INTRAVENOUS
  Filled 2016-12-09: qty 20

## 2016-12-09 MED ORDER — SODIUM CHLORIDE 0.9 % IV BOLUS (SEPSIS)
1000.0000 mL | Freq: Once | INTRAVENOUS | Status: AC
  Administered 2016-12-09: 1000 mL via INTRAVENOUS

## 2016-12-09 MED ORDER — ONDANSETRON HCL 4 MG/2ML IJ SOLN
4.0000 mg | Freq: Once | INTRAMUSCULAR | Status: AC
  Administered 2016-12-09: 4 mg via INTRAVENOUS
  Filled 2016-12-09: qty 2

## 2016-12-09 MED ORDER — ONDANSETRON HCL 4 MG PO TABS: 4.0000 mg | ORAL_TABLET | Freq: Three times a day (TID) | ORAL | 0 refills | Status: DC | PRN

## 2016-12-09 MED ORDER — MAGNESIUM SULFATE 2 GM/50ML IV SOLN
2.0000 g | Freq: Once | INTRAVENOUS | Status: AC
  Administered 2016-12-09: 2 g via INTRAVENOUS
  Filled 2016-12-09: qty 50

## 2016-12-09 MED ORDER — IOPAMIDOL (ISOVUE-300) INJECTION 61%
100.0000 mL | Freq: Once | INTRAVENOUS | Status: AC | PRN
  Administered 2016-12-09: 100 mL via INTRAVENOUS

## 2016-12-09 MED ORDER — MORPHINE SULFATE (PF) 4 MG/ML IV SOLN
4.0000 mg | Freq: Once | INTRAVENOUS | Status: AC
  Administered 2016-12-09: 4 mg via INTRAVENOUS
  Filled 2016-12-09: qty 1

## 2016-12-09 MED ORDER — HYDROMORPHONE HCL 1 MG/ML IJ SOLN
1.0000 mg | Freq: Once | INTRAMUSCULAR | Status: AC
  Administered 2016-12-09: 1 mg via INTRAVENOUS
  Filled 2016-12-09: qty 1

## 2016-12-09 MED ORDER — SODIUM CHLORIDE 0.9 % IV SOLN
30.0000 meq | Freq: Once | INTRAVENOUS | Status: AC
  Administered 2016-12-09: 30 meq via INTRAVENOUS
  Filled 2016-12-09: qty 15

## 2016-12-09 MED ORDER — IOPAMIDOL (ISOVUE-300) INJECTION 61%
INTRAVENOUS | Status: AC
  Filled 2016-12-09: qty 100

## 2016-12-09 NOTE — ED Notes (Signed)
PT IS HARD STICK. DR. Dina Rich AT BEDSIDE ATTEMPTING ULTRASOUND IV

## 2016-12-09 NOTE — ED Notes (Signed)
Pt sts she dosent want her blood drawn until she gets something for nausea

## 2016-12-09 NOTE — ED Notes (Signed)
Bed: WA04 Expected date:  Expected time:  Means of arrival:  Comments: EMS 

## 2016-12-09 NOTE — ED Triage Notes (Signed)
Brought from home via EMS for lower sharp abd pain that radiates to back starting around 0200.  Dx2.  Vx5, mostly bile.  Denies possible food poisoning or pregnancy.  States that she works in [employer] where she could've been exposed to flu.  IV 22G RH.  4 mg zofran. VS: 60 bpm, 160/100, 151 CBG, 18 RR, 99% RA

## 2016-12-09 NOTE — ED Provider Notes (Signed)
[facility] DEPT Provider Note   CSN: [id number] Arrival date & time: 12/09/16  0510     History   Chief Complaint Chief Complaint  Patient presents with  . Abdominal Pain  . Emesis    HPI Frances Mccormick is a 31 y.o. female.  HPI  This is a 31 year old female with history of Crohn's disease who presents with abdominal pain, vomiting, diarrhea. Onset of symptoms this morning. She reports non-A, nonbilious emesis and nonbloody diarrhea. She reports 8 out of 10 epigastric pain which radiates to her back. She reports that is sharp and achy in nature. She's not taken anything for her symptoms. No known sick contacts. Denies fevers or myalgias. She does not take anything for her Crohn's disease. Last flare one month ago.  Past Medical History:  Diagnosis Date  . Crohn's disease ([place])   . Non-compliant patient     Patient Active Problem List   Diagnosis Date Noted  . Intractable vomiting with nausea 10/28/2014  . Medically noncompliant 10/26/2014  . Crohn's disease ([place]) 10/26/2014    Past Surgical History:  Procedure Laterality Date  . INDUCED ABORTION      OB History    No data available       Home Medications    Prior to Admission medications   Medication Sig Start Date End Date Taking? Authorizing Provider  amoxicillin (AMOXIL) 500 MG capsule Take 1 capsule (500 mg total) by mouth 3 (three) times daily. Patient not taking: Reported on 12/09/2016 04/22/16   Konrad Felix, PA  cyclobenzaprine (FLEXERIL) 10 MG tablet Take 1 tablet (10 mg total) by mouth 2 (two) times daily as needed for muscle spasms. Patient not taking: Reported on 12/09/2016 01/21/16   Nona Dell, PA-C  dicyclomine (BENTYL) 20 MG tablet Take 1 tablet (20 mg total) by mouth 2 (two) times daily. Patient not taking: Reported on 12/09/2016 05/25/15   Waynetta Pean, PA-C  ibuprofen (ADVIL,MOTRIN) 800 MG tablet Take 1 tablet (800 mg total) by mouth 3 (three) times daily. Patient not taking:  Reported on 12/09/2016 01/21/16   Nona Dell, PA-C  ondansetron (ZOFRAN ODT) 8 MG disintegrating tablet Take 1 tablet (8 mg total) by mouth every 8 (eight) hours as needed for nausea. Patient not taking: Reported on 12/09/2016 05/28/15   Varney Biles, MD  oxyCODONE-acetaminophen (PERCOCET/ROXICET) 5-325 MG per tablet Take 2 tablets by mouth every 4 (four) hours as needed for severe pain. Patient not taking: Reported on 05/24/2015 04/13/15   Alvina Chou, PA-C  predniSONE (DELTASONE) 20 MG tablet 3 tabs po day one, then 2 tabs daily x 4 days Patient not taking: Reported on 06/08/2015 05/24/15   Domenic Moras, PA-C  promethazine (PHENERGAN) 25 MG tablet Take 1 tablet (25 mg total) by mouth every 6 (six) hours as needed for nausea or vomiting. Patient not taking: Reported on 12/09/2016 05/24/15   Domenic Moras, PA-C  traMADol (ULTRAM) 50 MG tablet Take 1 tablet (50 mg total) by mouth every 6 (six) hours as needed. Patient not taking: Reported on 12/09/2016 06/08/15   Carman Ching, PA-C    Family History No family history on file.  Social History Social History  Substance Use Topics  . Smoking status: Never Smoker  . Smokeless tobacco: Never Used  . Alcohol use Yes     Comment: ocassionally     Allergies   Bee venom and Ciprofloxacin   Review of Systems Review of Systems  Constitutional: Negative for fever.  Respiratory: Negative  for shortness of breath.   Cardiovascular: Negative for chest pain.  Gastrointestinal: Positive for abdominal pain, diarrhea, nausea and vomiting.  Genitourinary: Negative for dysuria.  All other systems reviewed and are negative.    Physical Exam Updated Vital Signs BP 133/87 (BP Location: Left Arm)   Pulse 82   Temp 97.9 F (36.6 C) (Oral)   Resp 16   Ht 5' (1.524 m)   SpO2 100%   Physical Exam  Constitutional: She is oriented to person, place, and time. No distress.  Overweight  HENT:  Head: Normocephalic and atraumatic.  Cardiovascular:  Normal rate, regular rhythm and normal heart sounds.   Pulmonary/Chest: Effort normal and breath sounds normal. No respiratory distress. She has no wheezes.  Abdominal: Soft. Bowel sounds are normal. She exhibits no mass. There is tenderness. There is no guarding.  Diffuse tenderness to palpation without rebound or guarding  Neurological: She is alert and oriented to person, place, and time.  Skin: Skin is warm and dry.  Psychiatric: She has a normal mood and affect.  Nursing note and vitals reviewed.    ED Treatments / Results  Labs (all labs ordered are listed, but only abnormal results are displayed) Labs Reviewed  URINALYSIS, ROUTINE W REFLEX MICROSCOPIC - Abnormal; Notable for the following:       Result Value   Color, Urine STRAW (*)    All other components within normal limits  CBC WITH DIFFERENTIAL/PLATELET  COMPREHENSIVE METABOLIC PANEL  LIPASE, BLOOD  I-STAT BETA HCG BLOOD, ED ([facility], [facility], [facility] ONLY)    EKG  EKG Interpretation None       Radiology No results found.  Procedures Procedures (including critical care time)  Angiocath insertion Performed by: Merryl Hacker  Consent: Verbal consent obtained. Risks and benefits: risks, benefits and alternatives were discussed Time out: Immediately prior to procedure a "time out" was called to verify the correct patient, procedure, equipment, support staff and site/side marked as required.  Preparation: Patient was prepped and draped in the usual sterile fashion.  Vein Location: right basilic  Ultrasound Guided  Gauge: 20  Normal blood return and flush without difficulty Patient tolerance: Patient tolerated the procedure well with no immediate complications.     Medications Ordered in ED Medications  HYDROmorphone (DILAUDID) injection 1 mg (not administered)  ondansetron (ZOFRAN) injection 4 mg (4 mg Intravenous Given 12/09/16 0603)  morphine 4 MG/ML injection 4 mg (4 mg Intravenous Given 12/09/16 0603)    sodium chloride 0.9 % bolus 1,000 mL (1,000 mLs Intravenous New Bag/Given 12/09/16 0604)  ondansetron (ZOFRAN) injection 4 mg (4 mg Intravenous Given 12/09/16 0727)     Initial Impression / Assessment and Plan / ED Course  I have reviewed the triage vital signs and the nursing notes.  Pertinent labs & imaging results that were available during my care of the patient were reviewed by me and considered in my medical decision making (see chart for details).  Clinical Course     Patient presents with vomiting, diarrhea, abdominal pain. Nontoxic. Vital signs reassuring. History of Crohn's. Abdominal exam is nonspecific without signs of peritonitis. Patient given pain and nausea medication. Patient care to ablate secondary to inability to obtain blood work. I placed an ultrasound-guided IV to obtain blood work. On recheck, patient reports persistent symptoms. Patient may need CT scan for further evaluation if symptoms persist.  Final Clinical Impressions(s) / ED Diagnoses   Final diagnoses:  None    New Prescriptions New Prescriptions   No  medications on file     Merryl Hacker, MD 12/09/16 [phone]

## 2016-12-09 NOTE — Discharge Instructions (Signed)
You were seen today with abdominal pain nausea vomiting. You had electrolyte abnormalities. These been corrected. Please take Zofran to keep yourself hydrated.

## 2017-02-18 ENCOUNTER — Encounter (HOSPITAL_COMMUNITY): Payer: Self-pay

## 2017-02-18 ENCOUNTER — Emergency Department (HOSPITAL_COMMUNITY)
Admission: EM | Admit: 2017-02-18 | Discharge: 2017-02-18 | Disposition: A | Discharge status: Home / Self Care | Payer: 59 | Attending: Emergency Medicine | Admitting: Emergency Medicine

## 2017-02-18 DIAGNOSIS — L723 Sebaceous cyst: Secondary | ICD-10-CM | POA: Insufficient documentation

## 2017-02-18 DIAGNOSIS — L089 Local infection of the skin and subcutaneous tissue, unspecified: Secondary | ICD-10-CM

## 2017-02-18 DIAGNOSIS — Z79899 Other long term (current) drug therapy: Secondary | ICD-10-CM | POA: Insufficient documentation

## 2017-02-18 MED ORDER — LIDOCAINE HCL 2 % IJ SOLN
20.0000 mL | Freq: Once | INTRAMUSCULAR | Status: AC
  Administered 2017-02-18: 400 mg
  Filled 2017-02-18: qty 20

## 2017-02-18 NOTE — ED Triage Notes (Signed)
Pt c/o increasing abscess in R axilla x 3 days.  Pain score 8/10.  Pt has not attempted any home treatments.

## 2017-02-18 NOTE — ED Notes (Signed)
Bed: WTR6 Expected date:  Expected time:  Means of arrival:  Comments: 

## 2017-02-18 NOTE — ED Notes (Signed)
PT DISCHARGED. INSTRUCTIONS GIVEN. AAOX4. PT IN NO APPARENT DISTRESS. THE OPPORTUNITY TO ASK QUESTIONS WAS PROVIDED. 

## 2017-02-18 NOTE — ED Provider Notes (Signed)
[facility] DEPT Provider Note   By signing my name below, I, Bea Graff, attest that this documentation has been prepared under the direction and in the presence of [employer], PA-C. Electronically Signed: Bea Graff, ED Scribe. 02/18/17. 11:30 AM.    History   Chief Complaint Chief Complaint  Patient presents with  . Abscess    The history is provided by the patient and medical records. No language interpreter was used.    Frances Mccormick is a 31 y.o. female who presents to the Emergency Department complaining of a worsening abscess to the right axilla that appeared three days ago. She reports associated soreness of the area. Pt reports that she has had these in the past but it normally resolves on its own. She has not taken anything for pain relief. Touching the area increases the pain. She denies alleviating factors. She denies fever, chills, nausea, vomiting.   Past Medical History:  Diagnosis Date  . Crohn's disease ([place])   . Non-compliant patient     Patient Active Problem List   Diagnosis Date Noted  . Intractable vomiting with nausea 10/28/2014  . Medically noncompliant 10/26/2014  . Crohn's disease ([place]) 10/26/2014    Past Surgical History:  Procedure Laterality Date  . INDUCED ABORTION      OB History    No data available       Home Medications    Prior to Admission medications   Medication Sig Start Date End Date Taking? Authorizing Provider  amoxicillin (AMOXIL) 500 MG capsule Take 1 capsule (500 mg total) by mouth 3 (three) times daily. Patient not taking: Reported on 12/09/2016 04/22/16   Konrad Felix, PA  cyclobenzaprine (FLEXERIL) 10 MG tablet Take 1 tablet (10 mg total) by mouth 2 (two) times daily as needed for muscle spasms. Patient not taking: Reported on 12/09/2016 01/21/16   Nona Dell, PA-C  dicyclomine (BENTYL) 20 MG tablet Take 1 tablet (20 mg total) by mouth 2 (two) times daily. Patient not taking: Reported on  12/09/2016 05/25/15   Waynetta Pean, PA-C  ibuprofen (ADVIL,MOTRIN) 800 MG tablet Take 1 tablet (800 mg total) by mouth 3 (three) times daily. Patient not taking: Reported on 12/09/2016 01/21/16   Nona Dell, PA-C  ondansetron (ZOFRAN ODT) 8 MG disintegrating tablet Take 1 tablet (8 mg total) by mouth every 8 (eight) hours as needed for nausea. Patient not taking: Reported on 12/09/2016 05/28/15   Varney Biles, MD  ondansetron (ZOFRAN) 4 MG tablet Take 1 tablet (4 mg total) by mouth every 8 (eight) hours as needed for nausea or vomiting. 12/09/16   Courteney Lyn Mackuen, MD  oxyCODONE-acetaminophen (PERCOCET/ROXICET) 5-325 MG per tablet Take 2 tablets by mouth every 4 (four) hours as needed for severe pain. Patient not taking: Reported on 05/24/2015 04/13/15   Alvina Chou, PA-C  predniSONE (DELTASONE) 20 MG tablet 3 tabs po day one, then 2 tabs daily x 4 days Patient not taking: Reported on 06/08/2015 05/24/15   Domenic Moras, PA-C  promethazine (PHENERGAN) 25 MG tablet Take 1 tablet (25 mg total) by mouth every 6 (six) hours as needed for nausea or vomiting. Patient not taking: Reported on 12/09/2016 05/24/15   Domenic Moras, PA-C  traMADol (ULTRAM) 50 MG tablet Take 1 tablet (50 mg total) by mouth every 6 (six) hours as needed. Patient not taking: Reported on 12/09/2016 06/08/15   Carman Ching, PA-C    Family History History reviewed. No pertinent family history.  Social History Social  History  Substance Use Topics  . Smoking status: Never Smoker  . Smokeless tobacco: Never Used  . Alcohol use Yes     Comment: ocassionally     Allergies   Bee venom and Ciprofloxacin   Review of Systems Review of Systems A complete 10 system review of systems was obtained and all systems are negative except as noted in the HPI and PMH.    Physical Exam Updated Vital Signs BP 118/63 (BP Location: Left Arm)   Pulse 67   Temp 98.5 F (36.9 C) (Oral)   Resp 18   Ht 5' (1.524 m)   LMP 02/02/2017    SpO2 100%   Physical Exam  Constitutional: She is oriented to person, place, and time. She appears well-developed and well-nourished.  HENT:  Head: Normocephalic and atraumatic.  Neck: Normal range of motion.  Cardiovascular: Normal rate.   Pulmonary/Chest: Effort normal.  Musculoskeletal: Normal range of motion.  Neurological: She is alert and oriented to person, place, and time.  Skin: Skin is warm and dry.  3 cm area of induration to right axilla with central fluctuance with no overlying cellulitis.  Psychiatric: She has a normal mood and affect. Her behavior is normal.  Nursing note and vitals reviewed.    ED Treatments / Results  DIAGNOSTIC STUDIES: Oxygen Saturation is 100% on RA, normal by my interpretation.   EMERGENCY DEPARTMENT US SOFT TISSUE INTERPRETATION "Study: Limited Soft Tissue Ultrasound"  INDICATIONS: Soft tissue infection Multiple views of the body part were obtained in real-time with a multi-frequency linear probe  PERFORMED BY: Myself IMAGES ARCHIVED?: Yes SIDE:Right  BODY PART:Axilla INTERPRETATION:  Abcess present    COORDINATION OF CARE: 10:35 AM- Will I & D abscess. Pt verbalizes understanding and agrees to plan.  INCISION AND DRAINAGE PROCEDURE NOTE: Patient identification was confirmed and verbal consent was obtained. This procedure was performed by Lenn Sink, PA-C at 10:46 AM. Site: right axilla Sterile procedures observed Needle size: 25 G Anesthetic used (type and amt): Lidocaine 2% without Epinephrine (4 mLs) Blade size: 11 Drainage: 2 mLs Complexity: Complex Packing used: 1/4" Iodoform Site anesthetized, incision made over site, wound drained and explored loculations, rinsed with copious amounts of normal saline, covered with dry, sterile dressing. Pt tolerated procedure well without complications. Instructions for care discussed verbally and pt provided with additional written instructions for homecare and f/u.    Medications    lidocaine (XYLOCAINE) 2 % (with pres) injection 400 mg (400 mg Infiltration Given by Other 02/18/17 1045)    Labs (all labs ordered are listed, but only abnormal results are displayed) Labs Reviewed - No data to display  EKG  EKG Interpretation None       Radiology No results found.  Procedures Procedures (including critical care time)  Medications Ordered in ED Medications  lidocaine (XYLOCAINE) 2 % (with pres) injection 400 mg (400 mg Infiltration Given by Other 02/18/17 1045)     Initial Impression / Assessment and Plan / ED Course  I have reviewed the triage vital signs and the nursing notes.  Pertinent labs & imaging results that were available during my care of the patient were reviewed by me and considered in my medical decision making (see chart for details).     31 year old female presents today with abscess to her right axilla.  No signs of surrounding cellulitis, this was uncomplicated.  This was likely infected sebaceous cyst.  Patient does not need antibiotics at this time wound care instructions given, strict return  precautions given.  Patient verbalized understanding and agreement to today's plan had no further questions or concerns.  I personally performed the services described in this documentation, which was scribed in my presence. The recorded information has been reviewed and is accurate.   Final Clinical Impressions(s) / ED Diagnoses   Final diagnoses:  Infected sebaceous cyst of skin    New Prescriptions New Prescriptions   No medications on file     Okey Regal, PA-C 02/18/17 1136    [address], PA-C 02/18/17 1343    Blanchie Dessert, MD 02/18/17 1630

## 2017-02-18 NOTE — Discharge Instructions (Signed)
Please read attached information. If you experience any new or worsening signs or symptoms please return to the emergency room for evaluation. Please follow-up with your primary care provider or specialist as discussed.  °

## 2017-04-07 ENCOUNTER — Emergency Department (HOSPITAL_COMMUNITY)
Admission: EM | Admit: 2017-04-07 | Discharge: 2017-04-07 | Disposition: A | Discharge status: Home / Self Care | Payer: Self-pay | Attending: Emergency Medicine | Admitting: Emergency Medicine

## 2017-04-07 ENCOUNTER — Encounter (HOSPITAL_COMMUNITY): Payer: Self-pay | Admitting: *Deleted

## 2017-04-07 ENCOUNTER — Emergency Department (HOSPITAL_BASED_OUTPATIENT_CLINIC_OR_DEPARTMENT_OTHER): Admit: 2017-04-07 | Discharge: 2017-04-07 | Disposition: A | Discharge status: Home / Self Care | Payer: Self-pay

## 2017-04-07 DIAGNOSIS — M7989 Other specified soft tissue disorders: Secondary | ICD-10-CM | POA: Insufficient documentation

## 2017-04-07 DIAGNOSIS — M79609 Pain in unspecified limb: Secondary | ICD-10-CM

## 2017-04-07 DIAGNOSIS — Z79899 Other long term (current) drug therapy: Secondary | ICD-10-CM | POA: Insufficient documentation

## 2017-04-07 NOTE — Discharge Instructions (Signed)
The ultrasound showed no evidence of a blood clot in her leg. As we discussed the swelling could be due to venous insufficiency. You can try to use compression socks to help with the swelling in her legs.  Follow-up: Primary care clinics listed below.  Return to the emergency department for any worsening swelling, redness or warmth of one leg, chest pain, difficulty breathing, fever or any other worsening or concerning symptoms.  If you do not have a primary care doctor you see regularly, please you the list below. Please call them to arrange for follow-up.    No Primary Care Doctor Call [facility] Other agencies that provide inexpensive medical care    [place]  [phone]    [facility]  [place]  [phone]    [facility]  [phone]    [facility]  [phone]    [facility]  [phone]

## 2017-04-07 NOTE — ED Notes (Signed)
Provided patient a Sprite with permission from provider.

## 2017-04-07 NOTE — Progress Notes (Signed)
**  Preliminary report by tech**  Right lower extremity venous duplex complete. There is no evidence of deep or superficial vein thrombosis involving the right lower extremity. All clearly visualized vessels appear patent and compressible. There is no evidence of a Baker's cyst on the right. Results were given to Providence Lanius PA.  04/07/17 6:38 PM Carlos Levering RVT

## 2017-04-07 NOTE — ED Triage Notes (Signed)
Pt complains of right lower leg pain and swelling since dropping a ~25lb box on her right lower leg 1 month ago. Pt states her leg became more sore and swollen a couple of days after the injury.

## 2017-04-07 NOTE — ED Provider Notes (Signed)
[facility] DEPT Provider Note   CSN: [id number] Arrival date & time: 04/07/17  1207     History   Chief Complaint Chief Complaint  Patient presents with  . Leg Pain  . Leg Swelling    HPI Frances Mccormick is a 31 y.o. female who presents with 1 week of progressively worsening right lower extremity pain and swelling. She has a history of intermittent bilateral lower extremity swelling that is worse with standing and improved with elevation, but feels like in the last week, her right leg has become slightly more swollen. She reports experiencing intermittent pain in the RLE for the past month after she a dropped a 25 lb box on the lateral aspect of her RLE. She initially had some ecchymosis to the area but did not have it evaluated. She states in the last week, right leg has felt more "tight, particularly in my calf." She states that sometimes the pain radiates up her upper right leg. She has been able to ambulate and bear weight on it but with mild pain. 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 She states that her swelling is worse at night after a long day of standing at work and improved with rest/elevation. She has tried ice and elevation with no significant improvement. She denies any warmth or erythema to the leg. She denies any history of blood clots in her legs or lungs, OCP use, recent surgery, recent immobilization, recent travel.  The history is provided by the patient.    Past Medical History:  Diagnosis Date  . Crohn's disease ([place])   . Non-compliant patient     Patient Active Problem List   Diagnosis Date Noted  . Intractable vomiting with nausea 10/28/2014  . Medically noncompliant 10/26/2014  . Crohn's disease ([place]) 10/26/2014    Past Surgical History:  Procedure Laterality Date  . INDUCED ABORTION      OB History    No data available       Home Medications     Prior to Admission medications   Medication Sig Start Date End Date Taking? Authorizing Provider  amoxicillin (AMOXIL) 500 MG capsule Take 1 capsule (500 mg total) by mouth 3 (three) times daily. Patient not taking: Reported on 12/09/2016 04/22/16   Konrad Felix, PA  cyclobenzaprine (FLEXERIL) 10 MG tablet Take 1 tablet (10 mg total) by mouth 2 (two) times daily as needed for muscle spasms. Patient not taking: Reported on 12/09/2016 01/21/16   Nona Dell, PA-C  dicyclomine (BENTYL) 20 MG tablet Take 1 tablet (20 mg total) by mouth 2 (two) times daily. Patient not taking: Reported on 12/09/2016 05/25/15   Waynetta Pean, PA-C  ibuprofen (ADVIL,MOTRIN) 800 MG tablet Take 1 tablet (800 mg total) by mouth 3 (three) times daily. Patient not taking: Reported on 12/09/2016 01/21/16   Nona Dell, PA-C  ondansetron [facility] ODT) 8 MG disintegrating tablet Take 1 tablet (8 mg total) by mouth every 8 (eight) hours as needed for nausea. Patient not taking: Reported on 12/09/2016 05/28/15   Varney Biles, MD  ondansetron (ZOFRAN) 4 MG tablet Take 1 tablet (4 mg total) by mouth every 8 (eight) hours as needed for nausea or vomiting. Patient not taking: Reported on 04/07/2017 12/09/16   Mackuen, Fredia Sorrow, MD  oxyCODONE-acetaminophen (PERCOCET/ROXICET) 5-325 MG per tablet Take 2 tablets by mouth every 4 (four) hours as needed  for severe pain. Patient not taking: Reported on 05/24/2015 04/13/15   Alvina Chou, PA-C  predniSONE (DELTASONE) 20 MG tablet 3 tabs po day one, then 2 tabs daily x 4 days Patient not taking: Reported on 06/08/2015 05/24/15   Domenic Moras, PA-C  promethazine (PHENERGAN) 25 MG tablet Take 1 tablet (25 mg total) by mouth every 6 (six) hours as needed for nausea or vomiting. Patient not taking: Reported on 12/09/2016 05/24/15   Domenic Moras, PA-C  traMADol (ULTRAM) 50 MG tablet Take 1 tablet (50 mg total) by mouth every 6 (six) hours as needed. Patient not taking:  Reported on 12/09/2016 06/08/15   Carman Ching, PA-C    Family History No family history on file.  Social History Social History  Substance Use Topics  . Smoking status: Never Smoker  . Smokeless tobacco: Never Used  . Alcohol use Yes     Comment: ocassionally     Allergies   Bee venom and Ciprofloxacin   Review of Systems Review of Systems  Constitutional: Negative for fever.  Respiratory: Negative for cough and shortness of breath.   Cardiovascular: Positive for leg swelling. Negative for chest pain.  Gastrointestinal: Negative for abdominal pain, constipation, diarrhea, nausea and vomiting.  Genitourinary: Negative for dysuria and hematuria.  Skin: Positive for color change.  Neurological: Negative for headaches.  All other systems reviewed and are negative.    Physical Exam Updated Vital Signs BP 115/81 (BP Location: Left Arm)   Pulse 67   Temp 98.2 F (36.8 C) (Oral)   Resp 20   Ht 5' (1.524 m)   Wt 86.2 kg   LMP 03/24/2017   SpO2 100%   BMI 37.11 kg/m   Physical Exam  Constitutional: She appears well-developed and well-nourished.  Sitting comfortably on bed.  HENT:  Head: Normocephalic and atraumatic.  Eyes: Conjunctivae and EOM are normal. Right eye exhibits no discharge. Left eye exhibits no discharge. No scleral icterus.  Cardiovascular: Normal rate, regular rhythm and normal pulses.   Pulses:      Dorsalis pedis pulses are 2+ on the right side, and 2+ on the left side.  Pulmonary/Chest: Effort normal and breath sounds normal.  Musculoskeletal: She exhibits no deformity.  Bilateral lower extremities with nonpitting edema that starts at the mid calf region and extends distally. Right is slightly greater than left, but with no surrounding warmth or erythema. Slight tenderness to palpation of the right calf. No RLE bony tenderness, no deformity, no crepitus, no ecchymosis.   Neurological: She is alert.  Skin: Skin is warm and dry. Capillary refill takes  less than 2 seconds.  Psychiatric: She has a normal mood and affect. Her speech is normal and behavior is normal.     ED Treatments / Results  Labs (all labs ordered are listed, but only abnormal results are displayed) Labs Reviewed - No data to display  EKG  EKG Interpretation None       Radiology No results found.  Procedures Procedures (including critical care time)  Medications Ordered in ED Medications - No data to display   Initial Impression / Assessment and Plan / ED Course  I have reviewed the triage vital signs and the nursing notes.  Pertinent labs & imaging results that were available during my care of the patient were reviewed by me and considered in my medical decision making (see chart for details).     31 year old female presents with 1 week of worsening right lower extremity swelling/pain. History of  intermittent bilateral lower extremity edema that is worse with standing and improved with elevation. History of dropping a 25 lb box on her RLE 1 month ago, but has been able to ambulate and bear weight since the incident. Physical exam shows bilateral, non-pitting lower extremity edema, with right slightly greater than left. No surrounding warmth or erythema. Distal pulses intact bilaterally. History/physical exam are suspicious for chronic venous insufficiency. Consider also DVT, but low suspicion given history/physical exam. History/physical exam and duration/mechanism of injury are not concerning for a fracture and XR not indicated at this time.  Will plan to evaluate with ultrasound to rule out presence of DVT.  Ultrasound resulted. No evidence of DVT. Discussed with her that I do not feel like she needs an XR at this time given history/physical exam and duration/mechanism of injury. Patient agrees.Symptoms likely result of venous insufficiency. Discussed results with patient. We discussed using compression socks while at work to help with some of the ongoing  swelling she has been having. Patient is stable for discharge at this time. Provided patient with a list of clinic resources to use if he does not have a PCP. Instructed to call them today to arrange follow-up in the next 24-48 hours.  Instructed patient to follow-up with referred clinics in 2 days. Return precautions discussed. Patient expresses understanding and agreement to plan.   Final Clinical Impressions(s) / ED Diagnoses   Final diagnoses:  Leg swelling    New Prescriptions Discharge Medication List as of 04/07/2017  6:57 PM       Volanda Napoleon, PA-C 04/07/17 1915    Fatima Blank, MD 04/08/17 [phone]

## 2017-10-25 ENCOUNTER — Encounter (HOSPITAL_COMMUNITY): Payer: Self-pay

## 2017-10-25 ENCOUNTER — Other Ambulatory Visit: Payer: Self-pay

## 2017-10-25 ENCOUNTER — Emergency Department (HOSPITAL_COMMUNITY)
Admission: EM | Admit: 2017-10-25 | Discharge: 2017-10-25 | Disposition: A | Discharge status: Home / Self Care | Payer: 59 | Attending: Emergency Medicine | Admitting: Emergency Medicine

## 2017-10-25 DIAGNOSIS — L02212 Cutaneous abscess of back [any part, except buttock]: Secondary | ICD-10-CM | POA: Insufficient documentation

## 2017-10-25 DIAGNOSIS — L0291 Cutaneous abscess, unspecified: Secondary | ICD-10-CM

## 2017-10-25 MED ORDER — NAPROXEN 500 MG PO TABS: 500.0000 mg | ORAL_TABLET | Freq: Two times a day (BID) | ORAL | 0 refills | Status: DC

## 2017-10-25 MED ORDER — LIDOCAINE-EPINEPHRINE (PF) 2 %-1:200000 IJ SOLN
20.0000 mL | Freq: Once | INTRAMUSCULAR | Status: AC
  Administered 2017-10-25: 20 mL
  Filled 2017-10-25: qty 20

## 2017-10-25 MED ORDER — DOXYCYCLINE HYCLATE 100 MG PO CAPS: 100.0000 mg | ORAL_CAPSULE | Freq: Two times a day (BID) | ORAL | 0 refills | Status: DC

## 2017-10-25 MED ORDER — NAPROXEN 500 MG PO TABS
500.0000 mg | ORAL_TABLET | Freq: Once | ORAL | Status: AC
  Administered 2017-10-25: 500 mg via ORAL
  Filled 2017-10-25: qty 1

## 2017-10-25 NOTE — Discharge Instructions (Addendum)
Please read the attached information regarding her condition. Take doxycycline twice daily for 5 days. Take naproxen as needed for pain. Follow-up at the wellness center for further evaluation. Return to ED for worsening pain, drainage, fevers, additional abscesses or bleeding that is excessive.

## 2017-10-25 NOTE — ED Notes (Signed)
Bed: WTR9 Expected date:  Expected time:  Means of arrival:  Comments: 

## 2017-10-25 NOTE — ED Provider Notes (Signed)
[facility] DEPT Provider Note   CSN: [id number] Arrival date & time: 10/25/17  1142     History   Chief Complaint Chief Complaint  Patient presents with  . Insect Bite    Right posterior Deltoid    HPI Frances Mccormick is a 31 y.o. female with no significant past medical history, who presents to ED for evaluation of abscess to right upper back for the past 4 days.  She is unsure if she had an insect bite in the area.  She does have a history of similar abscesses under her arms that have been incised and drained.  She has tried Benadryl ointment on the area with no relief in her symptoms.  She states that the area feels sore to touch but denies any associated fevers, drainage, body aches, fatigue, nausea or vomiting.  HPI  Past Medical History:  Diagnosis Date  . Crohn's disease ([place])   . Non-compliant patient     Patient Active Problem List   Diagnosis Date Noted  . Intractable vomiting with nausea 10/28/2014  . Medically noncompliant 10/26/2014  . Crohn's disease ([place]) 10/26/2014    Past Surgical History:  Procedure Laterality Date  . INDUCED ABORTION      OB History    No data available       Home Medications    Prior to Admission medications   Medication Sig Start Date End Date Taking? Authorizing Provider  amoxicillin (AMOXIL) 500 MG capsule Take 1 capsule (500 mg total) by mouth 3 (three) times daily. Patient not taking: Reported on 12/09/2016 04/22/16   Konrad Felix, PA  cyclobenzaprine (FLEXERIL) 10 MG tablet Take 1 tablet (10 mg total) by mouth 2 (two) times daily as needed for muscle spasms. Patient not taking: Reported on 12/09/2016 01/21/16   Nona Dell, PA-C  dicyclomine (BENTYL) 20 MG tablet Take 1 tablet (20 mg total) by mouth 2 (two) times daily. Patient not taking: Reported on 12/09/2016 05/25/15   Waynetta Pean, PA-C  doxycycline (VIBRAMYCIN) 100 MG capsule Take 1 capsule (100 mg total) by mouth 2  (two) times daily. 10/25/17   Ashlynne Shetterly, PA-C  ibuprofen (ADVIL,MOTRIN) 800 MG tablet Take 1 tablet (800 mg total) by mouth 3 (three) times daily. Patient not taking: Reported on 12/09/2016 01/21/16   Nona Dell, PA-C  naproxen (NAPROSYN) 500 MG tablet Take 1 tablet (500 mg total) by mouth 2 (two) times daily. 10/25/17   Abelino Tippin, PA-C  ondansetron (ZOFRAN ODT) 8 MG disintegrating tablet Take 1 tablet (8 mg total) by mouth every 8 (eight) hours as needed for nausea. Patient not taking: Reported on 12/09/2016 05/28/15   Varney Biles, MD  ondansetron (ZOFRAN) 4 MG tablet Take 1 tablet (4 mg total) by mouth every 8 (eight) hours as needed for nausea or vomiting. Patient not taking: Reported on 04/07/2017 12/09/16   Mackuen, Fredia Sorrow, MD  oxyCODONE-acetaminophen (PERCOCET/ROXICET) 5-325 MG per tablet Take 2 tablets by mouth every 4 (four) hours as needed for severe pain. Patient not taking: Reported on 05/24/2015 04/13/15   Alvina Chou, PA-C  predniSONE (DELTASONE) 20 MG tablet 3 tabs po day one, then 2 tabs daily x 4 days Patient not taking: Reported on 06/08/2015 05/24/15   Domenic Moras, PA-C  promethazine (PHENERGAN) 25 MG tablet Take 1 tablet (25 mg total) by mouth every 6 (six) hours as needed for nausea or vomiting. Patient not taking: Reported on 12/09/2016 05/24/15   Domenic Moras, PA-C  traMADol (  ULTRAM) 50 MG tablet Take 1 tablet (50 mg total) by mouth every 6 (six) hours as needed. Patient not taking: Reported on 12/09/2016 06/08/15   Carman Ching, PA-C    Family History No family history on file.  Social History Social History   Tobacco Use  . Smoking status: Never Smoker  . Smokeless tobacco: Never Used  Substance Use Topics  . Alcohol use: Yes    Comment: ocassionally  . Drug use: No     Allergies   Bee venom and Ciprofloxacin   Review of Systems Review of Systems  Constitutional: Negative for chills, fatigue and fever.  Respiratory: Negative for  shortness of breath.   Gastrointestinal: Negative for nausea and vomiting.  Musculoskeletal: Negative for neck pain and neck stiffness.  Skin: Positive for color change. Negative for rash and wound.  Neurological: Negative for weakness.     Physical Exam Updated Vital Signs BP 112/68 (BP Location: Right Arm)   Pulse 63   Temp 97.9 F (36.6 C) (Oral)   Resp 15   Ht 5' (1.524 m)   Wt 81.6 kg (180 lb)   LMP 09/27/2017 (Approximate)   SpO2 100%   BMI 35.15 kg/m   Physical Exam  Constitutional: She appears well-developed and well-nourished. No distress.  HENT:  Head: Normocephalic and atraumatic.  Eyes: Conjunctivae and EOM are normal. No scleral icterus.  Neck: Normal range of motion.  Pulmonary/Chest: Effort normal. No respiratory distress.  Neurological: She is alert.  Skin: No rash noted. She is not diaphoretic. There is erythema.  Approximately 3 cm circular area of erythema with induration noted on the right upper back.  No surrounding cellulitis, streaking noted.  Psychiatric: She has a normal mood and affect.  Nursing note and vitals reviewed.    ED Treatments / Results  Labs (all labs ordered are listed, but only abnormal results are displayed) Labs Reviewed - No data to display  EKG  EKG Interpretation None       Radiology No results found.  Procedures .Marland KitchenIncision and Drainage Date/Time: 10/25/2017 2:11 PM Performed by: Delia Heady, PA-C Authorized by: Delia Heady, PA-C   Consent:    Consent obtained:  Verbal   Consent given by:  Patient   Risks discussed:  Bleeding, pain and incomplete drainage Location:    Type:  Abscess   Location:  Trunk   Trunk location:  Back Pre-procedure details:    Skin preparation:  Betadine Anesthesia (see MAR for exact dosages):    Anesthesia method:  Local infiltration   Local anesthetic:  Lidocaine 2% WITH epi Procedure details:    Needle aspiration: yes     Needle size:  18 G   Incision types:  Stab  incision   Scalpel blade:  11   Wound management:  Probed and deloculated and irrigated with saline   Drainage:  Purulent   Drainage amount:  Moderate   Packing materials:  None Post-procedure details:    Patient tolerance of procedure:  Tolerated well, no immediate complications   EMERGENCY DEPARTMENT US SOFT TISSUE INTERPRETATION "Study: Limited Soft Tissue Ultrasound"  INDICATIONS: Soft tissue infection Multiple views of the body part were obtained in real-time with a multi-frequency linear probe  PERFORMED BY: Myself IMAGES ARCHIVED?: No SIDE:Right  BODY PART:Upper back INTERPRETATION:  Abcess present      Medications Ordered in ED Medications  naproxen (NAPROSYN) tablet 500 mg (not administered)  lidocaine-EPINEPHrine (XYLOCAINE W/EPI) 2 %-1:200000 (PF) injection 20 mL (20 mLs Infiltration Given 10/25/17  1324)     Initial Impression / Assessment and Plan / ED Course  I have reviewed the triage vital signs and the nursing notes.  Pertinent labs & imaging results that were available during my care of the patient were reviewed by me and considered in my medical decision making (see chart for details).     Patient presents to ED for evaluation of abscess to right upper back for the past 4 days.  Unknown if any insect bites.  She denies any other symptoms including no fever, drainage, myalgias, body aches, nausea or vomiting.  She is nontoxic-appearing and in no acute distress.  Ultrasound was obtained at the bedside and did show fluid collection under the skin.  Area was incised and drained and patient will be placed on antibiotics due to her recurrent history of abscesses.  Strict return precautions given.  Final Clinical Impressions(s) / ED Diagnoses   Final diagnoses:  Abscess    ED Discharge Orders        Ordered    doxycycline (VIBRAMYCIN) 100 MG capsule  2 times daily     10/25/17 1409    naproxen (NAPROSYN) 500 MG tablet  2 times daily     10/25/17 1410        Delia Heady, PA-C 10/25/17 1413    Fatima Blank, MD 10/25/17 1705

## 2017-10-25 NOTE — ED Triage Notes (Signed)
Pt noticed the bite due to pain that progressively worsened. Bite has become larger in area surface. Pt denies SOB / Chest pain

## 2018-04-01 ENCOUNTER — Encounter (HOSPITAL_COMMUNITY): Payer: Self-pay | Admitting: Emergency Medicine

## 2018-04-01 ENCOUNTER — Other Ambulatory Visit: Payer: Self-pay

## 2018-04-01 ENCOUNTER — Emergency Department (HOSPITAL_COMMUNITY)
Admission: EM | Admit: 2018-04-01 | Discharge: 2018-04-01 | Disposition: A | Discharge status: Home Health | Payer: 59 | Attending: Emergency Medicine | Admitting: Emergency Medicine

## 2018-04-01 DIAGNOSIS — Z79899 Other long term (current) drug therapy: Secondary | ICD-10-CM | POA: Insufficient documentation

## 2018-04-01 DIAGNOSIS — L02412 Cutaneous abscess of left axilla: Secondary | ICD-10-CM

## 2018-04-01 MED ORDER — TETANUS-DIPHTH-ACELL PERTUSSIS 5-2.5-18.5 LF-MCG/0.5 IM SUSP: 0.5000 mL | Freq: Once | INTRAMUSCULAR | Status: DC

## 2018-04-01 MED ORDER — LIDOCAINE-EPINEPHRINE (PF) 2 %-1:200000 IJ SOLN
10.0000 mL | Freq: Once | INTRAMUSCULAR | Status: AC
  Administered 2018-04-01: 10 mL
  Filled 2018-04-01: qty 20

## 2018-04-01 NOTE — ED Triage Notes (Signed)
Pt reports to having abscess under left arm that has been ongoing for the last 4 days.

## 2018-04-01 NOTE — Discharge Instructions (Addendum)
Keep the area covered and clean- wash it daily with soap and water.  Use tylenol or ibuprofen as needed for pain.  If its starts bleeding, apply firm pressure for 20 minutes, and if it does not stop, return to the ED.  Return to the ER if you develop fevers, worsening pain, increased pus draining, or any new or concerning symptoms.

## 2018-04-01 NOTE — ED Provider Notes (Signed)
[facility] DEPT Provider Note   CSN: [id number] Arrival date & time: 04/01/18  6160     History   Chief Complaint Chief Complaint  Patient presents with  . Abscess    HPI Frances Mccormick is a 32 y.o. female presenting for evaluation of abscess of the left axilla.  Patient states she developed a lesion on the left axilla 4 days ago.  It has been growing, becoming more painful, and red.  She denies drainage from the area.  She reports history of similar on the right side last year, which required I&D.  She denies fevers, chills, chest pain, shortness breath, nausea, vomiting.  She has no other medical problems, does not take medications daily.  She is not immunocompromised.  Is not on blood thinners.  She has not been taking anything for pain including Tylenol or ibuprofen.  Nothing makes it better, palpation makes it worse.  No pain with movement of her shoulder.  HPI  Past Medical History:  Diagnosis Date  . Crohn's disease ([place])   . Non-compliant patient     Patient Active Problem List   Diagnosis Date Noted  . Intractable vomiting with nausea 10/28/2014  . Medically noncompliant 10/26/2014  . Crohn's disease ([place]) 10/26/2014    Past Surgical History:  Procedure Laterality Date  . INDUCED ABORTION       OB History   None      Home Medications    Prior to Admission medications   Medication Sig Start Date End Date Taking? Authorizing Provider  acetaminophen (TYLENOL) 650 MG CR tablet Take 650 mg by mouth every 8 (eight) hours as needed for pain.   Yes [provider]  amoxicillin (AMOXIL) 500 MG capsule Take 1 capsule (500 mg total) by mouth 3 (three) times daily. Patient not taking: Reported on 12/09/2016 04/22/16   Konrad Felix, PA  cyclobenzaprine (FLEXERIL) 10 MG tablet Take 1 tablet (10 mg total) by mouth 2 (two) times daily as needed for muscle spasms. Patient not taking: Reported on 12/09/2016 01/21/16   Nona Dell, PA-C  dicyclomine (BENTYL) 20 MG tablet Take 1 tablet (20 mg total) by mouth 2 (two) times daily. Patient not taking: Reported on 12/09/2016 05/25/15   Waynetta Pean, PA-C  doxycycline (VIBRAMYCIN) 100 MG capsule Take 1 capsule (100 mg total) by mouth 2 (two) times daily. 10/25/17   Khatri, Hina, PA-C  ibuprofen (ADVIL,MOTRIN) 800 MG tablet Take 1 tablet (800 mg total) by mouth 3 (three) times daily. Patient not taking: Reported on 12/09/2016 01/21/16   Nona Dell, PA-C  naproxen (NAPROSYN) 500 MG tablet Take 1 tablet (500 mg total) by mouth 2 (two) times daily. 10/25/17   Khatri, Hina, PA-C  ondansetron (ZOFRAN ODT) 8 MG disintegrating tablet Take 1 tablet (8 mg total) by mouth every 8 (eight) hours as needed for nausea. Patient not taking: Reported on 12/09/2016 05/28/15   Varney Biles, MD  ondansetron (ZOFRAN) 4 MG tablet Take 1 tablet (4 mg total) by mouth every 8 (eight) hours as needed for nausea or vomiting. Patient not taking: Reported on 04/07/2017 12/09/16   Mackuen, Fredia Sorrow, MD  oxyCODONE-acetaminophen (PERCOCET/ROXICET) 5-325 MG per tablet Take 2 tablets by mouth every 4 (four) hours as needed for severe pain. Patient not taking: Reported on 05/24/2015 04/13/15   Alvina Chou, PA-C  predniSONE (DELTASONE) 20 MG tablet 3 tabs po day one, then 2 tabs daily x 4 days Patient not taking: Reported on 06/08/2015  05/24/15   Domenic Moras, PA-C  promethazine (PHENERGAN) 25 MG tablet Take 1 tablet (25 mg total) by mouth every 6 (six) hours as needed for nausea or vomiting. Patient not taking: Reported on 12/09/2016 05/24/15   Domenic Moras, PA-C  traMADol (ULTRAM) 50 MG tablet Take 1 tablet (50 mg total) by mouth every 6 (six) hours as needed. Patient not taking: Reported on 12/09/2016 06/08/15   Carman Ching, PA-C    Family History History reviewed. No pertinent family history.  Social History Social History   Tobacco Use  . Smoking status: Never Smoker  . Smokeless  tobacco: Never Used  Substance Use Topics  . Alcohol use: Yes    Comment: ocassionally  . Drug use: No     Allergies   Bee venom and Ciprofloxacin   Review of Systems Review of Systems  Constitutional: Negative for fever.  Skin:       Lesion of left axilla  Hematological: Does not bruise/bleed easily.     Physical Exam Updated Vital Signs BP 110/71   Pulse 76   Temp 97.8 F (36.6 C)   Resp 18   Ht 5' (1.524 m)   Wt 90.7 kg (200 lb)   LMP 03/28/2018   SpO2 99%   BMI 39.06 kg/m   Physical Exam  Constitutional: She is oriented to person, place, and time. She appears well-developed and well-nourished. No distress.  HENT:  Head: Normocephalic and atraumatic.  Eyes: EOM are normal.  Neck: Normal range of motion.  Pulmonary/Chest: Effort normal.  Abdominal: She exhibits no distension.  Musculoskeletal: Normal range of motion.  Neurological: She is alert and oriented to person, place, and time.  Skin: Skin is warm. No rash noted.  Tender indurated lesion of the left axilla without active drainage.  No streaking.    Psychiatric: She has a normal mood and affect.  Nursing note and vitals reviewed.    ED Treatments / Results  Labs (all labs ordered are listed, but only abnormal results are displayed) Labs Reviewed - No data to display  EKG None  Radiology No results found.  Procedures .Marland KitchenIncision and Drainage Date/Time: 04/01/2018 8:58 AM Performed by: Franchot Heidelberg, PA-C Authorized by: Franchot Heidelberg, PA-C   Consent:    Consent obtained:  Verbal   Consent given by:  Patient   Risks discussed:  Bleeding, incomplete drainage, infection and pain Location:    Type:  Abscess   Location:  Upper extremity   Upper extremity location: Left axilla. Pre-procedure details:    Skin preparation:  Betadine Anesthesia (see MAR for exact dosages):    Anesthesia method:  Local infiltration   Local anesthetic:  Lidocaine 2% WITH epi Procedure type:     Complexity:  Simple Procedure details:    Needle aspiration: no     Incision types:  Single straight   Incision depth:  Dermal   Scalpel blade:  11   Wound management:  Probed and deloculated and irrigated with saline   Drainage:  Purulent   Drainage amount:  Moderate   Wound treatment:  Wound left open   Packing materials:  None Post-procedure details:    Patient tolerance of procedure:  Tolerated well, no immediate complications   (including critical care time)  Medications Ordered in ED Medications  lidocaine-EPINEPHrine (XYLOCAINE W/EPI) 2 %-1:200000 (PF) injection 10 mL (10 mLs Infiltration Given 04/01/18 0754)     Initial Impression / Assessment and Plan / ED Course  I have reviewed the triage vital signs  and the nursing notes.  Pertinent labs & imaging results that were available during my care of the patient were reviewed by me and considered in my medical decision making (see chart for details).     Patient presenting with abscess of left axilla.  No sign of systemic infection.  No surrounding cellulitis.  I&D performed and purulent material expressed.  Aftercare instructions given.  Without surrounding cellulitis, and I do not believe she needs antibiotics at this time.  At this time, patient appears safe for discharge.  Return precautions given.  Patient states she is understands and agrees to plan.  Final Clinical Impressions(s) / ED Diagnoses   Final diagnoses:  Abscess of left axilla    ED Discharge Orders    None       Franchot Heidelberg, PA-C 04/01/18 0900    Varney Biles, MD 04/02/18 [phone]

## 2018-04-01 NOTE — ED Notes (Signed)
Bed: WA19 Expected date:  Expected time:  Means of arrival:  Comments: 

## 2018-07-08 ENCOUNTER — Encounter (HOSPITAL_COMMUNITY): Payer: Self-pay

## 2018-07-08 ENCOUNTER — Other Ambulatory Visit: Payer: Self-pay

## 2018-07-08 ENCOUNTER — Emergency Department (HOSPITAL_COMMUNITY)
Admission: EM | Admit: 2018-07-08 | Discharge: 2018-07-08 | Disposition: A | Discharge status: Home / Self Care | Payer: Medicaid Other | Attending: Emergency Medicine | Admitting: Emergency Medicine

## 2018-07-08 DIAGNOSIS — Y929 Unspecified place or not applicable: Secondary | ICD-10-CM | POA: Insufficient documentation

## 2018-07-08 DIAGNOSIS — W57XXXA Bitten or stung by nonvenomous insect and other nonvenomous arthropods, initial encounter: Secondary | ICD-10-CM | POA: Insufficient documentation

## 2018-07-08 DIAGNOSIS — Y939 Activity, unspecified: Secondary | ICD-10-CM | POA: Diagnosis not present

## 2018-07-08 DIAGNOSIS — S50861A Insect bite (nonvenomous) of right forearm, initial encounter: Secondary | ICD-10-CM | POA: Diagnosis not present

## 2018-07-08 DIAGNOSIS — Y999 Unspecified external cause status: Secondary | ICD-10-CM | POA: Insufficient documentation

## 2018-07-08 DIAGNOSIS — T63441A Toxic effect of venom of bees, accidental (unintentional), initial encounter: Secondary | ICD-10-CM

## 2018-07-08 MED ORDER — PREDNISONE 20 MG PO TABS
40.0000 mg | ORAL_TABLET | Freq: Once | ORAL | Status: AC
  Administered 2018-07-08: 40 mg via ORAL
  Filled 2018-07-08: qty 2

## 2018-07-08 MED ORDER — PREDNISONE 10 MG PO TABS: 40.0000 mg | ORAL_TABLET | Freq: Every day | ORAL | 0 refills | Status: DC

## 2018-07-08 MED ORDER — FAMOTIDINE 20 MG PO TABS
20.0000 mg | ORAL_TABLET | Freq: Once | ORAL | Status: AC
  Administered 2018-07-08: 20 mg via ORAL
  Filled 2018-07-08: qty 1

## 2018-07-08 MED ORDER — DIPHENHYDRAMINE HCL 25 MG PO TABS: 25.0000 mg | ORAL_TABLET | Freq: Four times a day (QID) | ORAL | 0 refills | Status: DC

## 2018-07-08 MED ORDER — LORATADINE 10 MG PO TABS
10.0000 mg | ORAL_TABLET | Freq: Once | ORAL | Status: AC
  Administered 2018-07-08: 10 mg via ORAL
  Filled 2018-07-08: qty 1

## 2018-07-08 MED ORDER — RANITIDINE HCL 150 MG PO TABS: 150.0000 mg | ORAL_TABLET | Freq: Two times a day (BID) | ORAL | 0 refills | Status: DC

## 2018-07-08 NOTE — ED Triage Notes (Addendum)
Pt reports a bee sting to the right forearm.Pt reports that the sting happened about 3 hours ago. Pt reports a bee allergy but denies any anaphylaxsis. Pt does not remember the reaction she had to the bee sting in the past. Pt denies any difficulty breathing. Facial swelling or throat swelling. Pt only endorses pain and localized swelling at the site.

## 2018-07-08 NOTE — ED Provider Notes (Signed)
[facility] DEPT Provider Note   CSN: [id number] Arrival date & time: 07/08/18  1415     History   Chief Complaint Chief Complaint  Patient presents with  . Insect Bite    HPI Frances Mccormick is a 32 y.o. female who presents to the ED with c/o bee string to the right forearm. The sting happened about 3 hours prior to arrival to the ED. Patient reports local redness and swelling. Patient denies any respiratory symptoms or systemic symptoms.   HPI  Past Medical History:  Diagnosis Date  . Crohn's disease ([place])   . Non-compliant patient     Patient Active Problem List   Diagnosis Date Noted  . Intractable vomiting with nausea 10/28/2014  . Medically noncompliant 10/26/2014  . Crohn's disease ([place]) 10/26/2014    Past Surgical History:  Procedure Laterality Date  . INDUCED ABORTION       OB History   None      Home Medications    Prior to Admission medications   Medication Sig Start Date End Date Taking? Authorizing Provider  acetaminophen (TYLENOL) 650 MG CR tablet Take 650 mg by mouth every 8 (eight) hours as needed for pain.    [provider]  diphenhydrAMINE (BENADRYL) 25 MG tablet Take 1 tablet (25 mg total) by mouth every 6 (six) hours. 07/08/18   Ashley Murrain, NP  predniSONE (DELTASONE) 10 MG tablet Take 4 tablets (40 mg total) by mouth daily with breakfast. 07/08/18   Ashley Murrain, NP  ranitidine (ZANTAC) 150 MG tablet Take 1 tablet (150 mg total) by mouth 2 (two) times daily. 07/08/18   Ashley Murrain, NP    Family History History reviewed. No pertinent family history.  Social History Social History   Tobacco Use  . Smoking status: Never Smoker  . Smokeless tobacco: Never Used  Substance Use Topics  . Alcohol use: Yes    Comment: ocassionally  . Drug use: No     Allergies   Bee venom and Ciprofloxacin   Review of Systems Review of Systems  Skin: Positive for color change and wound.  All other systems  reviewed and are negative.    Physical Exam Updated Vital Signs BP 129/85 (BP Location: Left Arm)   Pulse 70   Temp 98.3 F (36.8 C) (Oral)   Resp 18   Wt 90.7 kg   LMP 06/27/2018   SpO2 100%   BMI 39.06 kg/m   Physical Exam  Constitutional: She is oriented to person, place, and time. She appears well-developed and well-nourished. No distress.  HENT:  Head: Normocephalic.  Mouth/Throat: Oropharynx is clear and moist.  Eyes: Conjunctivae and EOM are normal.  Neck: Neck supple.  Cardiovascular: Normal rate and regular rhythm.  Pulmonary/Chest: Effort normal and breath sounds normal. No stridor. She has no wheezes. She exhibits no tenderness.  Musculoskeletal: Normal range of motion.  Neurological: She is alert and oriented to person, place, and time.  Skin:  Right forearm with erythema and mild swelling. There is a tiny puncture where the sting occurred. No red streaking.   Psychiatric: She has a normal mood and affect. Her behavior is normal.  Nursing note and vitals reviewed.    ED Treatments / Results  Labs (all labs ordered are listed, but only abnormal results are displayed) Labs Reviewed - No data to display  Radiology No results found.  Procedures Procedures (including critical care time)  Medications Ordered in ED Medications  famotidine (  PEPCID) tablet 20 mg (has no administration in time range)  predniSONE (DELTASONE) tablet 40 mg (has no administration in time range)  loratadine (CLARITIN) tablet 10 mg (has no administration in time range)     Initial Impression / Assessment and Plan / ED Course  I have reviewed the triage vital signs and the nursing notes. 32 y.o. female here with bee sting to the right forearm stable for d/c without respiratory symptoms, lungs are clear, no difficulty swallowing and no signs of infection. Will treat for local reaction of bee sting. Return precautions discussed.   Final Clinical Impressions(s) / ED Diagnoses    Final diagnoses:  Local reaction to bee sting, accidental or unintentional, initial encounter    ED Discharge Orders         Ordered    ranitidine (ZANTAC) 150 MG tablet  2 times daily     07/08/18 1711    predniSONE (DELTASONE) 10 MG tablet  Daily with breakfast     07/08/18 1711    diphenhydrAMINE (BENADRYL) 25 MG tablet  Every 6 hours     07/08/18 1711           Janit Bern Coronado, NP 07/08/18 1717    Sherwood Gambler, MD 07/09/18 0000

## 2018-07-08 NOTE — ED Notes (Signed)
Pt c/o tightness in chest that has increased since pt was triaged

## 2018-07-08 NOTE — Discharge Instructions (Addendum)
Take the medication as directed. The benadryl can make you sleepy so do not drive while taking it. Follow up with your doctor or return here for worsening symptoms.

## 2018-07-26 ENCOUNTER — Other Ambulatory Visit: Payer: Self-pay

## 2018-07-26 ENCOUNTER — Emergency Department (HOSPITAL_COMMUNITY): Payer: Medicaid Other

## 2018-07-26 ENCOUNTER — Encounter (HOSPITAL_COMMUNITY): Payer: Self-pay | Admitting: Emergency Medicine

## 2018-07-26 ENCOUNTER — Observation Stay (HOSPITAL_COMMUNITY)
Admission: EM | Admit: 2018-07-26 | Discharge: 2018-07-27 | Disposition: A | Discharge status: Home / Self Care | Payer: Medicaid Other | Attending: Internal Medicine | Admitting: Internal Medicine

## 2018-07-26 DIAGNOSIS — Z833 Family history of diabetes mellitus: Secondary | ICD-10-CM | POA: Insufficient documentation

## 2018-07-26 DIAGNOSIS — A599 Trichomoniasis, unspecified: Secondary | ICD-10-CM | POA: Insufficient documentation

## 2018-07-26 DIAGNOSIS — Z91030 Bee allergy status: Secondary | ICD-10-CM | POA: Diagnosis not present

## 2018-07-26 DIAGNOSIS — N760 Acute vaginitis: Secondary | ICD-10-CM | POA: Insufficient documentation

## 2018-07-26 DIAGNOSIS — Z881 Allergy status to other antibiotic agents status: Secondary | ICD-10-CM | POA: Insufficient documentation

## 2018-07-26 DIAGNOSIS — K5090 Crohn's disease, unspecified, without complications: Secondary | ICD-10-CM | POA: Diagnosis not present

## 2018-07-26 DIAGNOSIS — N390 Urinary tract infection, site not specified: Secondary | ICD-10-CM | POA: Diagnosis not present

## 2018-07-26 DIAGNOSIS — B9689 Other specified bacterial agents as the cause of diseases classified elsewhere: Secondary | ICD-10-CM

## 2018-07-26 DIAGNOSIS — N3090 Cystitis, unspecified without hematuria: Secondary | ICD-10-CM

## 2018-07-26 DIAGNOSIS — O26891 Other specified pregnancy related conditions, first trimester: Secondary | ICD-10-CM | POA: Diagnosis not present

## 2018-07-26 DIAGNOSIS — R112 Nausea with vomiting, unspecified: Principal | ICD-10-CM | POA: Insufficient documentation

## 2018-07-26 DIAGNOSIS — Z79899 Other long term (current) drug therapy: Secondary | ICD-10-CM | POA: Insufficient documentation

## 2018-07-26 DIAGNOSIS — D72829 Elevated white blood cell count, unspecified: Secondary | ICD-10-CM | POA: Insufficient documentation

## 2018-07-26 DIAGNOSIS — Z3A01 Less than 8 weeks gestation of pregnancy: Secondary | ICD-10-CM

## 2018-07-26 DIAGNOSIS — R1030 Lower abdominal pain, unspecified: Secondary | ICD-10-CM

## 2018-07-26 DIAGNOSIS — O219 Vomiting of pregnancy, unspecified: Secondary | ICD-10-CM

## 2018-07-26 HISTORY — DX: Migraine, unspecified, not intractable, without status migrainosus: G43.909

## 2018-07-26 LAB — CBC WITH DIFFERENTIAL/PLATELET
BASOS PCT: 0 %
Basophils Absolute: 0 10*3/uL (ref 0.0–0.1)
Eosinophils Absolute: 0 10*3/uL (ref 0.0–0.7)
Eosinophils Relative: 0 %
HEMATOCRIT: 41.9 % (ref 36.0–46.0)
Hemoglobin: 14.3 g/dL (ref 12.0–15.0)
LYMPHS ABS: 1.8 10*3/uL (ref 0.7–4.0)
Lymphocytes Relative: 8 %
MCH: 27.2 pg (ref 26.0–34.0)
MCHC: 34.1 g/dL (ref 30.0–36.0)
MCV: 79.7 fL (ref 78.0–100.0)
MONO ABS: 1.1 10*3/uL — AB (ref 0.1–1.0)
MONOS PCT: 5 %
Neutro Abs: 19.8 10*3/uL — ABNORMAL HIGH (ref 1.7–7.7)
Neutrophils Relative %: 87 %
PLATELETS: 425 10*3/uL — AB (ref 150–400)
RBC: 5.26 MIL/uL — AB (ref 3.87–5.11)
RDW: 16 % — AB (ref 11.5–15.5)
WBC: 22.7 10*3/uL — ABNORMAL HIGH (ref 4.0–10.5)

## 2018-07-26 LAB — COMPREHENSIVE METABOLIC PANEL
ALBUMIN: 4.2 g/dL (ref 3.5–5.0)
ALK PHOS: 60 U/L (ref 38–126)
ALT: 14 U/L (ref 0–44)
AST: 19 U/L (ref 15–41)
Anion gap: 11 (ref 5–15)
BUN: 12 mg/dL (ref 6–20)
CALCIUM: 9.2 mg/dL (ref 8.9–10.3)
CHLORIDE: 105 mmol/L (ref 98–111)
CO2: 24 mmol/L (ref 22–32)
CREATININE: 0.77 mg/dL (ref 0.44–1.00)
GFR calc Af Amer: >60 mL/min (ref >60)
GFR calc non Af Amer: >60 mL/min (ref >60)
GLUCOSE: 135 mg/dL — AB (ref 70–99)
Potassium: 3.7 mmol/L (ref 3.5–5.1)
SODIUM: 140 mmol/L (ref 135–145)
Total Bilirubin: 0.3 mg/dL (ref 0.3–1.2)
Total Protein: 8.3 g/dL — ABNORMAL HIGH (ref 6.5–8.1)

## 2018-07-26 LAB — URINALYSIS, ROUTINE W REFLEX MICROSCOPIC
Bilirubin Urine: NEGATIVE
Glucose, UA: NEGATIVE mg/dL
HGB URINE DIPSTICK: NEGATIVE
Ketones, ur: 5 mg/dL — AB
Nitrite: NEGATIVE
PROTEIN: NEGATIVE mg/dL
Specific Gravity, Urine: 1.015 (ref 1.005–1.030)
pH: 7 (ref 5.0–8.0)

## 2018-07-26 LAB — WET PREP, GENITAL
SPERM: NONE SEEN
Yeast Wet Prep HPF POC: NONE SEEN

## 2018-07-26 LAB — ABO/RH: ABO/RH(D): B POS

## 2018-07-26 LAB — GROUP A STREP BY PCR: GROUP A STREP BY PCR: NOT DETECTED

## 2018-07-26 LAB — HCG, QUANTITATIVE, PREGNANCY: hCG, Beta Chain, Quant, S: 123 m[IU]/mL — ABNORMAL HIGH (ref <5)

## 2018-07-26 LAB — LIPASE, BLOOD: Lipase: 27 U/L (ref 11–51)

## 2018-07-26 LAB — POC URINE PREG, ED: PREG TEST UR: POSITIVE — AB

## 2018-07-26 MED ORDER — METRONIDAZOLE 500 MG PO TABS
2000.0000 mg | ORAL_TABLET | Freq: Once | ORAL | Status: DC
  Filled 2018-07-26: qty 4

## 2018-07-26 MED ORDER — MORPHINE SULFATE (PF) 4 MG/ML IV SOLN
4.0000 mg | Freq: Once | INTRAVENOUS | Status: AC
  Administered 2018-07-26: 4 mg via INTRAVENOUS
  Filled 2018-07-26: qty 1

## 2018-07-26 MED ORDER — DOXYLAMINE SUCCINATE (SLEEP) 25 MG PO TABS
12.5000 mg | ORAL_TABLET | Freq: Once | ORAL | Status: AC
  Administered 2018-07-26: 12.5 mg via ORAL
  Filled 2018-07-26: qty 1

## 2018-07-26 MED ORDER — ONDANSETRON HCL 4 MG/2ML IJ SOLN
4.0000 mg | Freq: Four times a day (QID) | INTRAMUSCULAR | Status: DC | PRN
  Administered 2018-07-27: 4 mg via INTRAVENOUS
  Filled 2018-07-26: qty 2

## 2018-07-26 MED ORDER — PROMETHAZINE HCL 25 MG/ML IJ SOLN
25.0000 mg | Freq: Once | INTRAMUSCULAR | Status: AC
  Administered 2018-07-26: 25 mg via INTRAVENOUS
  Filled 2018-07-26: qty 1

## 2018-07-26 MED ORDER — METRONIDAZOLE 500 MG PO TABS: 500.0000 mg | ORAL_TABLET | Freq: Two times a day (BID) | ORAL | Status: DC

## 2018-07-26 MED ORDER — ACETAMINOPHEN 325 MG PO TABS
650.0000 mg | ORAL_TABLET | Freq: Four times a day (QID) | ORAL | Status: DC | PRN
  Administered 2018-07-27: 650 mg via ORAL
  Filled 2018-07-26: qty 2

## 2018-07-26 MED ORDER — LORAZEPAM 1 MG PO TABS
1.0000 mg | ORAL_TABLET | Freq: Once | ORAL | Status: AC
  Administered 2018-07-26: 1 mg via SUBLINGUAL
  Filled 2018-07-26: qty 1

## 2018-07-26 MED ORDER — ACETAMINOPHEN 500 MG PO TABS
1000.0000 mg | ORAL_TABLET | Freq: Once | ORAL | Status: DC
  Filled 2018-07-26: qty 2

## 2018-07-26 MED ORDER — SODIUM CHLORIDE 0.9 % IV BOLUS
1000.0000 mL | Freq: Once | INTRAVENOUS | Status: AC
  Administered 2018-07-26: 1000 mL via INTRAVENOUS

## 2018-07-26 MED ORDER — SODIUM CHLORIDE 0.9 % IV SOLN
INTRAVENOUS | Status: DC
  Administered 2018-07-27: 01:00:00 via INTRAVENOUS

## 2018-07-26 MED ORDER — PROMETHAZINE HCL 25 MG/ML IJ SOLN
12.5000 mg | Freq: Four times a day (QID) | INTRAMUSCULAR | Status: DC | PRN
  Filled 2018-07-26: qty 1

## 2018-07-26 MED ORDER — DOXYLAMINE SUCCINATE (SLEEP) 25 MG PO TABS
12.5000 mg | ORAL_TABLET | Freq: Once | ORAL | Status: DC
  Filled 2018-07-26: qty 1

## 2018-07-26 MED ORDER — ONDANSETRON HCL 4 MG/2ML IJ SOLN
4.0000 mg | Freq: Once | INTRAMUSCULAR | Status: AC
  Administered 2018-07-26: 4 mg via INTRAVENOUS
  Filled 2018-07-26: qty 2

## 2018-07-26 MED ORDER — ACETAMINOPHEN 650 MG RE SUPP: 650.0000 mg | Freq: Four times a day (QID) | RECTAL | Status: DC | PRN

## 2018-07-26 MED ORDER — SODIUM CHLORIDE 0.9 % IV SOLN
1.0000 g | Freq: Once | INTRAVENOUS | Status: AC
  Administered 2018-07-26: 1 g via INTRAVENOUS
  Filled 2018-07-26: qty 10

## 2018-07-26 MED ORDER — PYRIDOXINE HCL 100 MG/ML IJ SOLN
10.0000 mg | Freq: Once | INTRAMUSCULAR | Status: AC
  Administered 2018-07-26: 10 mg via INTRAVENOUS
  Filled 2018-07-26: qty 0.1

## 2018-07-26 MED ORDER — SODIUM CHLORIDE 0.9 % IV SOLN
1.0000 g | INTRAVENOUS | Status: DC
  Administered 2018-07-27: 1 g via INTRAVENOUS
  Filled 2018-07-26: qty 10

## 2018-07-26 MED ORDER — SODIUM CHLORIDE 0.9 % IV SOLN
1.0000 g | Freq: Once | INTRAVENOUS | Status: DC
  Administered 2018-07-26: 1 g via INTRAVENOUS
  Filled 2018-07-26: qty 1

## 2018-07-26 MED ORDER — METRONIDAZOLE 500 MG PO TABS
2000.0000 mg | ORAL_TABLET | Freq: Once | ORAL | Status: AC
  Administered 2018-07-26: 2000 mg via ORAL
  Filled 2018-07-26: qty 4

## 2018-07-26 MED ORDER — SODIUM CHLORIDE 0.9 % IV BOLUS
500.0000 mL | Freq: Once | INTRAVENOUS | Status: AC
  Administered 2018-07-26: 500 mL via INTRAVENOUS

## 2018-07-26 NOTE — ED Notes (Signed)
Patient does not want to take oral Flagyl. PA to check with Pharmacy about IV Flagyl.

## 2018-07-26 NOTE — ED Provider Notes (Signed)
Frances Mccormick is a 32 y.o. female, with a history of Crohn's disease, presenting to the ED with abdominal pain beginning this morning.  States the pain "felt like a pulsing" in the suprapubic region accompanied by cramping in the bilateral lower quadrants and pressure in the back.  She states, "I originally thought it could be the start of my period."  She has had "too many to count" episodes of nonbloody, nonbilious emesis.  She has had one episode of soft stool.  Endorses some increased, white abnormal vaginal discharge over the last few days.  One week ago she began to have what she refers to as "cold symptoms," which include nonproductive cough, nasal congestion, and sore throat.  Sore throat is bilateral, moderate, sharp, nonradiating.  Her current pain is different from her previous Crohn's flare and that it is more constant, she typically does not have repeat vomiting with her flares, and she usually has bloody diarrhea.  States it has been almost 3 years since her last Crohn's flare.  Has been at least 3 years since her last follow-up with gastroenterologist, Dr. Benson Norway.  Denies fever/chills, hematochezia/melena, hematemesis, vaginal bleeding, or any other complaints.   HPI from Alferd Apa, PA-C: "LETZY Mccormick is a 32 y.o. female with a history of Crohn's who presents emergency department today for abdominal pain, nausea, vomiting and diarrhea.  Patient reports this morning she awoke with diffuse lower abdominal pain, nausea, non-bilious/nonbloody emesis (greater then 5 episodes with last 5 minutes ago), and nonbloody/non-melanous diarrhea x2.  She reports her abdominal pain is both sharp and cramping in nature.  It is constant since onset and rates it as a 7/10.  She knows nothing makes her symptoms better or worse.  She thought she may have strep throat so she has been taking expired amoxicillin over the last 1 week.  She states she is not sure if she is pregnant.  Her last menstrual  cycle was on 06/27/2018. Patient is sexually active with 1 female partner and does not use protection. Patient has not taken anything for symptoms.  She reports that this feels like a flare of her Crohn's.  She is not currently on any medication for her Crohn's.  She was previously followed by Dr. Benson Norway but states she lost her insurance and has not followed up in greater than 1 year.  Her last flare was approximately 1 year ago.  Patient denies any fever, chills, chest pain, shortness of breath, flank pain, upper abdominal pain, urinary frequency, urinary urgency, dysuria, hematuria, pelvic pain, vaginal discharge, vaginal bleeding.  She denies contacts with persons with similar symptoms, ingestion of suspicious foods or waters, history of recent foreign travel."  Past Medical History:  Diagnosis Date  . Crohn's disease ([place])   . Non-compliant patient     Physical Exam  BP (!) 166/74   Pulse (!) 56   Temp 98.6 F (37 C) (Oral)   Resp 18   Ht 5' (1.524 m)   Wt 90.7 kg   LMP 06/27/2018   SpO2 100%   BMI 39.06 kg/m   Physical Exam  Constitutional: She appears well-developed and well-nourished. No distress.  HENT:  Head: Normocephalic and atraumatic.  Mouth/Throat: Uvula is midline. Oropharyngeal exudate, posterior oropharyngeal edema and posterior oropharyngeal erythema present.  Eyes: Conjunctivae are normal.  Neck: Neck supple.  Cardiovascular: Normal rate, regular rhythm, normal heart sounds and intact distal pulses.  Pulmonary/Chest: Effort normal and breath sounds normal. No respiratory distress.  Abdominal: Soft.  There is no tenderness. There is no guarding.    No peritoneal signs.  Musculoskeletal: She exhibits no edema.  Lymphadenopathy:    She has no cervical adenopathy.  Neurological: She is alert.  Skin: Skin is warm and dry. She is not diaphoretic.  Psychiatric: She has a normal mood and affect. Her behavior is normal.  Nursing note and vitals reviewed.   ED  Course/Procedures   Abnormal Labs Reviewed  WET PREP, GENITAL - Abnormal; Notable for the following components:      Result Value   Trich, Wet Prep PRESENT (*)    Clue Cells Wet Prep HPF POC PRESENT (*)    WBC, Wet Prep HPF POC MANY (*)    All other components within normal limits  COMPREHENSIVE METABOLIC PANEL - Abnormal; Notable for the following components:   Glucose, Bld 135 (*)    Total Protein 8.3 (*)    All other components within normal limits  CBC WITH DIFFERENTIAL/PLATELET - Abnormal; Notable for the following components:   WBC 22.7 (*)    RBC 5.26 (*)    RDW 16.0 (*)    Platelets 425 (*)    Neutro Abs 19.8 (*)    Monocytes Absolute 1.1 (*)    All other components within normal limits  URINALYSIS, ROUTINE W REFLEX MICROSCOPIC - Abnormal; Notable for the following components:   APPearance CLOUDY (*)    Ketones, ur 5 (*)    Leukocytes, UA SMALL (*)    Bacteria, UA MANY (*)    All other components within normal limits  HCG, QUANTITATIVE, PREGNANCY - Abnormal; Notable for the following components:   hCG, Beta Chain, Quant, S 123 (*)    All other components within normal limits  POC URINE PREG, ED - Abnormal; Notable for the following components:   Preg Test, Ur POSITIVE (*)    All other components within normal limits   US OB Comp < 14 Wks  Final Result    US OB Transvaginal  Final Result    MR ABDOMEN WO CONTRAST    (Results Pending)  MR PELVIS WO CONTRAST    (Results Pending)    Clinical Course as of Jul 27 2215  Mon Jul 26, 2018  1620 Patient states her pain is still 9/10, located primarily in the suprapubic region.  She continues to have nausea and vomiting. We had a shared decision-making conversation regarding medications, such as morphine and Ativan in the setting of pregnancy.  I informed the patient that we do not definitively know the effects of these medications at a first trimester fetus. She acknowledges this and requests to proceed with these  medications. She also states, "I don't have any plans to keep the pregnancy anyway."   [SJ]  1715 Patient states her pain has improved to 6/10.  Her nausea has significantly improved and she has not vomited since administration of the morphine and Ativan.   [SJ]  [id number] Spoke with Dr. Rip Harbour, OBGYN on call for [facility].  Recommends we have this patient follow-up for repeat hCG quant in 48 hours with strict return precautions.   [SJ]  2049 Spoke with Dr. Jola Baptist, radiologist. States if patient needs appendicitis rule out, she will need MRI of the abdomen/pelvis without contrast.  This should be performed at [facility] due to the technical nature of the study and the experience of the technicians at [facility] versus [facility].   [SJ]  2108 Discussed next step options with the patient.  Patient  states her pain has returned and rates it 7/10.  She also began vomiting again. She states she does wish to go forward with the transfer to [facility] and the MRI.   [SJ]  2123 Spoke with Joslyn Devon, pharmacist, regarding alternative route for administration of flagyl for her trich. States it will need to be administered PO.    [SJ]  2153 Spoke with Dr. Maudie Mercury, hospitalist. Agrees to admit the patient at [facility].  He will arrange for the transfer and the MRI.   [SJ]    Clinical Course User Index [SJ] Joy, Shawn C, PA-C    Procedures  MDM   End of shift patient care handoff report received from [employer], PA-C. Patient treated with Flagyl for trichomonas and evidence of BV.  Treatment also initiated for bacteriuria in the presence of pregnancy.   Quick summary: Patient with abdominal pain and vomiting.  She is being transferred to [facility] for MRI of the abdomen and pelvis and for subsequent admission for intractable vomiting.   Additional MDM details: Patient with onset of lower abdominal pain beginning this morning, accompanied by nausea and vomiting.  My suspicion for Crohn's flare is  lower because patient has only had one loose stool. Her pregnancy test was positive and her hCG was quite low, therefore, the likelihood of visualizing an IUP, even when one exists, is also quite low.   On this front, patient will follow-up with OB/GYN in 48 hours for repeat beta-hCG. Ultrasound tech was unable to visualize the appendix during the pelvic ultrasound.  Location of the patient's pain and her tenderness on exam varied throughout her ED course, lessening the suspicion for appendicitis.  Differential also includes pyelonephritis versus lower UTI.  Shared decision-making was used regarding obtaining additional imaging (MRI) versus watch and wait.  Patient opted for MRI to be performed.   We had difficulty controlling the patient's nausea and vomiting.  It appeared to be controlled for a short amount time, but then recurred.  Her vomiting even delayed her ultrasound.  Flagyl had to be readministered because patient vomited almost immediately after taking the first dose. Patient admitted for intractable vomiting.    Findings and plan of care discussed with Quintella Reichert, MD. Dr. Ralene Bathe personally evaluated and examined this patient.  Vitals:   07/26/18 1800 07/26/18 1815 07/26/18 1830 07/26/18 1845  BP:   (!) 172/146   Pulse: 68 66 73 73  Resp:   16   Temp:      TempSrc:      SpO2: 99% 100% 100% 99%  Weight:      Height:       Vitals:   07/26/18 1930 07/26/18 2000 07/26/18 2030 07/26/18 2100  BP: 102/65 (!) 114/59 106/66 110/86  Pulse: 84 70 68 79  Resp: 14     Temp:      TempSrc:      SpO2: 100% 99% 98% 99%  Weight:      Height:             Lorayne Bender, PA-C 07/26/18 2243    Quintella Reichert, MD 07/27/18 1717

## 2018-07-26 NOTE — ED Notes (Signed)
Patient reports she is still nauseas and would like to hold on taking the Tylenol. Will try the other medication for nausea for administering Tylenol.

## 2018-07-26 NOTE — ED Notes (Signed)
Pt resting comfortably, pt states she does not feel as nauseous anymore

## 2018-07-26 NOTE — ED Notes (Signed)
Report given to the receiving nurse. Charge nurse on the floor requests holding on transfer until the are able to solidify if the patient is appropriate for the floor.

## 2018-07-26 NOTE — ED Notes (Signed)
Ultrasound at bedside

## 2018-07-26 NOTE — H&P (Signed)
TRH H&P   Patient Demographics:    Frances Mccormick, is a 32 y.o. female  MRN: [id number]   DOB - 03/22/1986  Admit Date - 07/26/2018  Outpatient Primary MD for the patient is Patient, No Pcp Per  Referring MD/NP/PA:   Alferd Apa  Outpatient Specialists:     Patient coming from: home  Chief Complaint  Patient presents with  . Abdominal Pain      HPI:    Frances Mccormick  is a 32 y.o. female, w crohns disease apparently c/o bilateral lower abdominal cramping as well as intractable nausea and vommitting starting this AM.  Pt also noted slight white vaginal discharge over the past 1-2 days.  Pt notes slight flank pain but denies fever, chills, diarrhea, brbpr, black stool, hematuria.    In Ed,  Per ED, radiology recommended MRI abd/ pelvis r/o appendicitis.    US OB IMPRESSION: 1. Pregnancy of unknown anatomic location (no intrauterine gestational sac or adnexal mass identified). Differential diagnosis includes recent spontaneous miscarriage, IUP too early to visualize, and non-visualized ectopic pregnancy. Recommend correlation with serial beta-hCG levels, and follow up US if warranted clinically. Please note, with beta HCG of less than 3,000, pregnancy may not be sonographically evident.  Na 140, K 3.7,  Bun 12, Creatinine 0.77 Ast 19, Alt 14, Alk phos 60, T. Bili 0.3 Lipase 27 Wbc 22.7, Hgb 14.3, Plt 425  POC urine preg positive  Urinalysis wbc 21-50, rbc 6-10 Squamous 11-20  (? Contamination)  Wet prep + clue cells and trichomonas Pt failed zofran iv x2, phenergan iv x1 for reduction of nausea and vomitting  Pt will be admitted to hospital for evaluation of UTI, for iv abx since pt can't seem to hold down oral medication as well as intractable nausea and vomitting and BV, and trichomonas    Review of systems:    In addition to the HPI above,  No  Fever-chills, No Headache, No changes with Vision or hearing, No problems swallowing food or Liquids, No Chest pain, Cough or Shortness of Breath, No Abdominal pain,  Bowel movements are regular, No Blood in stool or Urine, No dysuria, No new skin rashes or bruises, No new joints pains-aches,  No new weakness, tingling, numbness in any extremity, No recent weight gain or loss, No polyuria, polydypsia or polyphagia, No significant Mental Stressors.  A full 10 point Review of Systems was done, except as stated above, all other Review of Systems were negative.   With Past History of the following :    Past Medical History:  Diagnosis Date  . Crohn's disease ([place])   . Non-compliant patient       Past Surgical History:  Procedure Laterality Date  . INDUCED ABORTION        Social History:     Social History   Tobacco Use  . Smoking status: Never Smoker  .  Smokeless tobacco: Never Used  Substance Use Topics  . Alcohol use: Yes    Comment: ocassionally     Lives - at home  Mobility - walks by self   Family History :     Family History  Problem Relation Age of Onset  . Diabetes Mother   . Diabetes Father        Home Medications:   Prior to Admission medications   Medication Sig Start Date End Date Taking? Authorizing Provider  diphenhydrAMINE (BENADRYL) 25 MG tablet Take 1 tablet (25 mg total) by mouth every 6 (six) hours. Patient not taking: Reported on 07/26/2018 07/08/18   Ashley Murrain, NP  predniSONE (DELTASONE) 10 MG tablet Take 4 tablets (40 mg total) by mouth daily with breakfast. Patient not taking: Reported on 07/26/2018 07/08/18   Ashley Murrain, NP  ranitidine (ZANTAC) 150 MG tablet Take 1 tablet (150 mg total) by mouth 2 (two) times daily. Patient not taking: Reported on 07/26/2018 07/08/18   Ashley Murrain, NP     Allergies:     Allergies  Allergen Reactions  . Bee Venom Anaphylaxis  . Ciprofloxacin Anaphylaxis     Physical Exam:    Vitals  Blood pressure 110/86, pulse 79, temperature 98.6 F (37 C), temperature source Oral, resp. rate 14, height 5' (1.524 m), weight 90.7 kg, last menstrual period 06/27/2018, SpO2 99 %.   1. General  lying in bed in NAD,   2. Normal affect and insight, Not Suicidal or Homicidal, Awake Alert, Oriented X 3.  3. No F.N deficits, ALL C.Nerves Intact, Strength 5/5 all 4 extremities, Sensation intact all 4 extremities, Plantars down going.  4. Ears and Eyes appear Normal, Conjunctivae clear, PERRLA. Moist Oral Mucosa.  5. Supple Neck, No JVD, No cervical lymphadenopathy appriciated, No Carotid Bruits.  6. Symmetrical Chest wall movement, Good air movement bilaterally, CTAB.  7. RRR, No Gallops, Rubs or Murmurs, No Parasternal Heave.  8. Positive Bowel Sounds, Abdomen Soft, No tenderness, No organomegaly appriciated,No rebound -guarding or rigidity.  9.  No Cyanosis, Normal Skin Turgor, No Skin Rash or Bruise.  10. Good muscle tone,  joints appear normal , no effusions, Normal ROM.  11. No Palpable Lymph Nodes in Neck or Axillae      Data Review:    CBC Recent Labs  Lab 07/26/18 1307  WBC 22.7*  HGB 14.3  HCT 41.9  PLT 425*  MCV 79.7  MCH 27.2  MCHC 34.1  RDW 16.0*  LYMPHSABS 1.8  MONOABS 1.1*  EOSABS 0.0  BASOSABS 0.0   ------------------------------------------------------------------------------------------------------------------  Chemistries  Recent Labs  Lab 07/26/18 1307  NA 140  K 3.7  CL 105  CO2 24  GLUCOSE 135*  BUN 12  CREATININE 0.77  CALCIUM 9.2  AST 19  ALT 14  ALKPHOS 60  BILITOT 0.3   ------------------------------------------------------------------------------------------------------------------ estimated creatinine clearance is 101.4 mL/min (by C-G formula based on SCr of 0.77 mg/dL). ------------------------------------------------------------------------------------------------------------------ No results for input(s):  TSH, T4TOTAL, T3FREE, THYROIDAB in the last 72 hours.  Invalid input(s): FREET3  Coagulation profile No results for input(s): INR, PROTIME in the last 168 hours. ------------------------------------------------------------------------------------------------------------------- No results for input(s): DDIMER in the last 72 hours. -------------------------------------------------------------------------------------------------------------------  Cardiac Enzymes No results for input(s): CKMB, TROPONINI, MYOGLOBIN in the last 168 hours.  Invalid input(s): CK ------------------------------------------------------------------------------------------------------------------ No results found for: BNP   ---------------------------------------------------------------------------------------------------------------  Urinalysis    Component Value Date/Time   COLORURINE YELLOW 07/26/2018 1307   APPEARANCEUR CLOUDY (A) 07/26/2018 1307  LABSPEC 1.015 07/26/2018 1307   PHURINE 7.0 07/26/2018 1307   GLUCOSEU NEGATIVE 07/26/2018 1307   HGBUR NEGATIVE 07/26/2018 1307   BILIRUBINUR NEGATIVE 07/26/2018 1307   KETONESUR 5 (A) 07/26/2018 1307   PROTEINUR NEGATIVE 07/26/2018 1307   UROBILINOGEN 0.2 06/08/2015 0736   NITRITE NEGATIVE 07/26/2018 1307   LEUKOCYTESUR SMALL (A) 07/26/2018 1307    ----------------------------------------------------------------------------------------------------------------   Imaging Results:    US Ob Comp < 14 Wks  Result Date: 07/26/2018 CLINICAL DATA:  Lower abdominal pain. Vomiting. Beta HCG 123. Gestational age by last menstrual. Three weeks and 4 days. EXAM: OBSTETRIC <14 WK [place] AND TRANSVAGINAL OB US TECHNIQUE: Both transabdominal and transvaginal ultrasound examinations were performed for complete evaluation of the gestation as well as the maternal uterus, adnexal regions, and pelvic cul-de-sac. Transvaginal technique was performed to assess early pregnancy.  COMPARISON:  None. FINDINGS: Intrauterine gestational sac: Not present Yolk sac:  Not present with marginal vascularity. Embryo:  Not present Cardiac Activity: Not present Subchorionic hemorrhage:  None visualized. Maternal uterus/adnexae: Multiple nabothian cysts at the level of the cervix. Small volume free fluid in the pelvis. RIGHT intra-ovarian isoechoic 16 mm corpus luteal cyst. LEFT ovary not sonographically identified. IMPRESSION: 1. Pregnancy of unknown anatomic location (no intrauterine gestational sac or adnexal mass identified). Differential diagnosis includes recent spontaneous miscarriage, IUP too early to visualize, and non-visualized ectopic pregnancy. Recommend correlation with serial beta-hCG levels, and follow up US if warranted clinically. Please note, with beta HCG of less than 3,000, pregnancy may not be sonographically evident. Electronically Signed   By: Elon Alas M.D.   On: 07/26/2018 18:53   US Ob Transvaginal  Result Date: 07/26/2018 CLINICAL DATA:  Lower abdominal pain. Vomiting. Beta HCG 123. Gestational age by last menstrual. Three weeks and 4 days. EXAM: OBSTETRIC <14 WK [place] AND TRANSVAGINAL OB US TECHNIQUE: Both transabdominal and transvaginal ultrasound examinations were performed for complete evaluation of the gestation as well as the maternal uterus, adnexal regions, and pelvic cul-de-sac. Transvaginal technique was performed to assess early pregnancy. COMPARISON:  None. FINDINGS: Intrauterine gestational sac: Not present Yolk sac:  Not present with marginal vascularity. Embryo:  Not present Cardiac Activity: Not present Subchorionic hemorrhage:  None visualized. Maternal uterus/adnexae: Multiple nabothian cysts at the level of the cervix. Small volume free fluid in the pelvis. RIGHT intra-ovarian isoechoic 16 mm corpus luteal cyst. LEFT ovary not sonographically identified. IMPRESSION: 1. Pregnancy of unknown anatomic location (no intrauterine gestational sac or adnexal  mass identified). Differential diagnosis includes recent spontaneous miscarriage, IUP too early to visualize, and non-visualized ectopic pregnancy. Recommend correlation with serial beta-hCG levels, and follow up US if warranted clinically. Please note, with beta HCG of less than 3,000, pregnancy may not be sonographically evident. Electronically Signed   By: Elon Alas M.D.   On: 07/26/2018 18:53       Assessment & Plan:    Active Problems:   Nausea & vomiting    Nausea and vomitting zofran iv q6h prn Phenergan 12.29m iv q6h prn if zofran not effective  Abdominal pain ? Secondary to UTI, BV or Trichomonas TX BV/ trichomonas with flagyl 5080mpo bid MRI abd/ pelvis as per radiology recommendation R/o appendicitis though clinically not having abdominal tenderness to exam  UTI Awaiting culture Start rocephin 1gm iv qday     DVT Prophylaxis  - SCDs   AM Labs Ordered, also please review Full Orders  Family Communication: Admission, patients condition and plan of care including tests being ordered have  been discussed with the patient  who indicate understanding and agree with the plan and Code Status.  Code Status  FULL CODE  Likely DC to  home  Condition GUARDED    Consults called:  none  Admission status: observation  Time spent in minutes : 71   Jani Gravel M.D on 07/26/2018 at 10:32 PM  Between 7am to 7pm - Pager - [phone]   After 7pm go to [email or web address] - password [facility]  [facility] - Office  [phone]

## 2018-07-26 NOTE — ED Triage Notes (Signed)
Pt arrived via [facility] Experiencing N/V x2 times on EMS Nausea starting 8/25 Achy abdominal pain. No distention. Possibly pregnant. Hx of Crohns. States this is similar to the last flare up she had.pt has been taking expired antibiotics.

## 2018-07-26 NOTE — Progress Notes (Signed)
ED TO INPATIENT HANDOFF REPORT  Name/Age/Gender Frances Mccormick 32 y.o. female  Code Status    Code Status Orders  (From admission, onward)         Start     Ordered   07/26/18 2200  Full code  Continuous     07/26/18 2206        Code Status History    Date Active Date Inactive Code Status Order ID Comments User Context   01/06/2015 1344 01/08/2015 2122 Full Code [id number]  Velvet Bathe, MD Inpatient   10/28/2014 1302 10/31/2014 1717 Full Code [id number]  Reyne Dumas, MD ED      Home/SNF/Other Home  Chief Complaint nausea  Level of Care/Admitting Diagnosis ED Disposition    ED Disposition Condition [place]: [place] [[id number]]  Level of Care: Telemetry [5]  Diagnosis: Nausea & vomiting [209470]  Admitting Physician: Jani Gravel [[id number]]  Attending Physician: Jani Gravel [[id number]]  PT Class (Do Not Modify): Observation [104]  PT Acc Code (Do Not Modify): Observation [10022]       Medical History Past Medical History:  Diagnosis Date  . Crohn's disease ([place])   . Non-compliant patient     Allergies Allergies  Allergen Reactions  . Bee Venom Anaphylaxis  . Ciprofloxacin Anaphylaxis    IV Location/Drains/Wounds Patient Lines/Drains/Airways Status   Active Line/Drains/Airways    Name:   Placement date:   Placement time:   Site:   Days:   Peripheral IV 07/26/18 Left Antecubital   07/26/18    1306    Antecubital   less than 1   Wound / Incision (Open or Dehisced) 02/18/17 Other (Comment) Axilla Right   02/18/17    1040    Axilla   523          Labs/Imaging Results for orders placed or performed during the hospital encounter of 07/26/18 (from the past 48 hour(s))  Comprehensive metabolic panel     Status: Abnormal   Collection Time: 07/26/18  1:07 PM  Result Value Ref Range   Sodium 140 135 - 145 mmol/L   Potassium 3.7 3.5 - 5.1 mmol/L   Chloride 105 98 - 111 mmol/L   CO2 24 22 - 32 mmol/L   Glucose, Bld 135 (H)  70 - 99 mg/dL   BUN 12 6 - 20 mg/dL   Creatinine, Ser 0.77 0.44 - 1.00 mg/dL   Calcium 9.2 8.9 - 10.3 mg/dL   Total Protein 8.3 (H) 6.5 - 8.1 g/dL   Albumin 4.2 3.5 - 5.0 g/dL   AST 19 15 - 41 U/L   ALT 14 0 - 44 U/L   Alkaline Phosphatase 60 38 - 126 U/L   Total Bilirubin 0.3 0.3 - 1.2 mg/dL   GFR calc non Af Amer >60 >60 mL/min   GFR calc Af Amer >60 >60 mL/min    Comment: (NOTE) The eGFR has been calculated using the CKD EPI equation. This calculation has not been validated in all clinical situations. eGFR's persistently <60 mL/min signify possible Chronic Kidney Disease.    Anion gap 11 5 - 15    Comment: Performed at [facility], [place] [address] [place], [place] [address]  Lipase, blood     Status: None   Collection Time: 07/26/18  1:07 PM  Result Value Ref Range   Lipase 27 11 - 51 U/L    Comment: Performed at [facility], [place] [address] [place],  [place] [address]  CBC with Differential     Status: Abnormal   Collection Time: 07/26/18  1:07 PM  Result Value Ref Range   WBC 22.7 (H) 4.0 - 10.5 K/uL    Comment: WHITE COUNT CONFIRMED ON SMEAR   RBC 5.26 (H) 3.87 - 5.11 MIL/uL   Hemoglobin 14.3 12.0 - 15.0 g/dL   HCT 41.9 36.0 - 46.0 %   MCV 79.7 78.0 - 100.0 fL   MCH 27.2 26.0 - 34.0 pg   MCHC 34.1 30.0 - 36.0 g/dL   RDW 16.0 (H) 11.5 - 15.5 %   Platelets 425 (H) 150 - 400 K/uL   Neutrophils Relative % 87 %   Lymphocytes Relative 8 %   Monocytes Relative 5 %   Eosinophils Relative 0 %   Basophils Relative 0 %   Neutro Abs 19.8 (H) 1.7 - 7.7 K/uL   Lymphs Abs 1.8 0.7 - 4.0 K/uL   Monocytes Absolute 1.1 (H) 0.1 - 1.0 K/uL   Eosinophils Absolute 0.0 0.0 - 0.7 K/uL   Basophils Absolute 0.0 0.0 - 0.1 K/uL   WBC Morphology WHITE COUNT CONFIRMED ON SMEAR     Comment: Performed at [facility], [place] [address] [place], [place] [address]  Urinalysis, Routine w reflex microscopic     Status: Abnormal   Collection  Time: 07/26/18  1:07 PM  Result Value Ref Range   Color, Urine YELLOW YELLOW   APPearance CLOUDY (A) CLEAR   Specific Gravity, Urine 1.015 1.005 - 1.030   pH 7.0 5.0 - 8.0   Glucose, UA NEGATIVE NEGATIVE mg/dL   Hgb urine dipstick NEGATIVE NEGATIVE   Bilirubin Urine NEGATIVE NEGATIVE   Ketones, ur 5 (A) NEGATIVE mg/dL   Protein, ur NEGATIVE NEGATIVE mg/dL   Nitrite NEGATIVE NEGATIVE   Leukocytes, UA SMALL (A) NEGATIVE   RBC / HPF 6-10 0 - 5 RBC/hpf   WBC, UA 21-50 0 - 5 WBC/hpf   Bacteria, UA MANY (A) NONE SEEN   Squamous Epithelial / LPF 11-20 0 - 5   WBC Clumps PRESENT    Amorphous Crystal PRESENT     Comment: Performed at [facility], [place] [address] [place], [place] [address]  POC urine preg, ED     Status: Abnormal   Collection Time: 07/26/18  1:09 PM  Result Value Ref Range   Preg Test, Ur POSITIVE (A) NEGATIVE    Comment:        THE SENSITIVITY OF THIS METHODOLOGY IS >24 mIU/mL   ABO/Rh     Status: None   Collection Time: 07/26/18  1:21 PM  Result Value Ref Range   ABO/RH(D) B POS    No rh immune globuloin      NOT A RH IMMUNE GLOBULIN CANDIDATE, PT RH POSITIVE Performed at [place] [address] [place],  [address]   hCG, quantitative, pregnancy     Status: Abnormal   Collection Time: 07/26/18  1:21 PM  Result Value Ref Range   hCG, Beta Chain, Quant, S 123 (H) <5 mIU/mL    Comment:          GEST. AGE      CONC.  (mIU/mL)   <=1 WEEK        5 - 50     2 WEEKS       50 - 500     3 WEEKS       100 - 10,000  4 WEEKS     1,000 - 30,000     5 WEEKS     3,500 - 115,000   6-8 WEEKS     12,000 - 270,000    12 WEEKS     15,000 - 220,000        FEMALE AND NON-PREGNANT FEMALE:     LESS THAN 5 mIU/mL Performed at [facility], [place] [address], [place], [place] [address]   Wet prep, genital     Status: Abnormal   Collection Time: 07/26/18  1:50 PM  Result Value Ref Range   Yeast Wet Prep HPF POC  NONE SEEN NONE SEEN   Trich, Wet Prep PRESENT (A) NONE SEEN   Clue Cells Wet Prep HPF POC PRESENT (A) NONE SEEN   WBC, Wet Prep HPF POC MANY (A) NONE SEEN   Sperm NONE SEEN     Comment: Performed at [facility], [place] [address], [place], [place] [address]  Group A Strep by PCR     Status: None   Collection Time: 07/26/18  7:54 PM  Result Value Ref Range   Group A Strep by PCR NOT DETECTED NOT DETECTED    Comment: Performed at [facility], [place] [address] [place], [place] [address]   US Ob Comp < 14 Wks  Result Date: 07/26/2018 CLINICAL DATA:  Lower abdominal pain. Vomiting. Beta HCG 123. Gestational age by last menstrual. Three weeks and 4 days. EXAM: OBSTETRIC <14 WK [place] AND TRANSVAGINAL OB US TECHNIQUE: Both transabdominal and transvaginal ultrasound examinations were performed for complete evaluation of the gestation as well as the maternal uterus, adnexal regions, and pelvic cul-de-sac. Transvaginal technique was performed to assess early pregnancy. COMPARISON:  None. FINDINGS: Intrauterine gestational sac: Not present Yolk sac:  Not present with marginal vascularity. Embryo:  Not present Cardiac Activity: Not present Subchorionic hemorrhage:  None visualized. Maternal uterus/adnexae: Multiple nabothian cysts at the level of the cervix. Small volume free fluid in the pelvis. RIGHT intra-ovarian isoechoic 16 mm corpus luteal cyst. LEFT ovary not sonographically identified. IMPRESSION: 1. Pregnancy of unknown anatomic location (no intrauterine gestational sac or adnexal mass identified). Differential diagnosis includes recent spontaneous miscarriage, IUP too early to visualize, and non-visualized ectopic pregnancy. Recommend correlation with serial beta-hCG levels, and follow up US if warranted clinically. Please note, with beta HCG of less than 3,000, pregnancy may not be sonographically evident. Electronically Signed   By: Elon Alas M.D.   On:  07/26/2018 18:53   US Ob Transvaginal  Result Date: 07/26/2018 CLINICAL DATA:  Lower abdominal pain. Vomiting. Beta HCG 123. Gestational age by last menstrual. Three weeks and 4 days. EXAM: OBSTETRIC <14 WK [place] AND TRANSVAGINAL OB US TECHNIQUE: Both transabdominal and transvaginal ultrasound examinations were performed for complete evaluation of the gestation as well as the maternal uterus, adnexal regions, and pelvic cul-de-sac. Transvaginal technique was performed to assess early pregnancy. COMPARISON:  None. FINDINGS: Intrauterine gestational sac: Not present Yolk sac:  Not present with marginal vascularity. Embryo:  Not present Cardiac Activity: Not present Subchorionic hemorrhage:  None visualized. Maternal uterus/adnexae: Multiple nabothian cysts at the level of the cervix. Small volume free fluid in the pelvis. RIGHT intra-ovarian isoechoic 16 mm corpus luteal cyst. LEFT ovary not sonographically identified. IMPRESSION: 1. Pregnancy of unknown anatomic location (no intrauterine gestational sac or adnexal mass identified). Differential diagnosis includes recent spontaneous miscarriage, IUP too early to visualize, and non-visualized ectopic pregnancy. Recommend correlation with serial beta-hCG levels, and  follow up US if warranted clinically. Please note, with beta HCG of less than 3,000, pregnancy may not be sonographically evident. Electronically Signed   By: Elon Alas M.D.   On: 07/26/2018 18:53    Pending Labs Unresulted Labs (From admission, onward)    Start     Ordered   07/27/18 0500  Comprehensive metabolic panel  Tomorrow morning,   R     07/26/18 2207   07/27/18 0500  CBC  Tomorrow morning,   R     07/26/18 2207   07/26/18 1311  Urine culture  STAT,   STAT     07/26/18 1310   07/26/18 1311  RPR  (STI Panel)  Once,   STAT     07/26/18 1310   07/26/18 1311  HIV antibody  (STI Panel)  Once,   STAT     07/26/18 1310          Vitals/Pain Today's Vitals   07/26/18 2030  07/26/18 2100 07/26/18 2224 07/26/18 2230  BP: 106/66 110/86  121/71  Pulse: 68 79  63  Resp:    14  Temp:      TempSrc:      SpO2: 98% 99%  99%  Weight:      Height:      PainSc:   7      Isolation Precautions No active isolations  Medications Medications  acetaminophen (TYLENOL) tablet 1,000 mg (0 mg Oral Hold 07/26/18 1430)  metroNIDAZOLE (FLAGYL) tablet 2,000 mg (2,000 mg Oral Not Given 07/26/18 2125)  0.9 %  sodium chloride infusion (has no administration in time range)  acetaminophen (TYLENOL) tablet 650 mg (has no administration in time range)    Or  acetaminophen (TYLENOL) suppository 650 mg (has no administration in time range)  cefTRIAXone (ROCEPHIN) 1 g in sodium chloride 0.9 % 100 mL IVPB (has no administration in time range)  metroNIDAZOLE (FLAGYL) tablet 500 mg (has no administration in time range)  ondansetron (ZOFRAN) injection 4 mg (4 mg Intravenous Given 07/26/18 1340)  pyridOXINE (B-6) injection 10 mg (10 mg Intravenous Given 07/26/18 1425)  doxylamine (Sleep) (UNISOM) tablet 12.5 mg (12.5 mg Oral Given 07/26/18 1429)  sodium chloride 0.9 % bolus 1,000 mL (0 mLs Intravenous Stopped 07/26/18 1640)  metroNIDAZOLE (FLAGYL) tablet 2,000 mg (2,000 mg Oral Given 07/26/18 1604)  promethazine (PHENERGAN) injection 25 mg (25 mg Intravenous Given 07/26/18 1606)  sodium chloride 0.9 % bolus 500 mL (0 mLs Intravenous Stopped 07/26/18 1747)  cefTRIAXone (ROCEPHIN) 1 g in sodium chloride 0.9 % 100 mL IVPB (0 g Intravenous Stopped 07/26/18 1657)  morphine 4 MG/ML injection 4 mg (4 mg Intravenous Given 07/26/18 1639)  LORazepam (ATIVAN) tablet 1 mg (1 mg Sublingual Given 07/26/18 1639)  LORazepam (ATIVAN) tablet 1 mg (1 mg Sublingual Given 07/26/18 2217)  morphine 4 MG/ML injection 4 mg (4 mg Intravenous Given 07/26/18 2217)    Mobility walks

## 2018-07-26 NOTE — ED Provider Notes (Addendum)
[facility] DEPT Provider Note   CSN: [id number] Arrival date & time: 07/26/18  1146     History   Chief Complaint Chief Complaint  Patient presents with  . Abdominal Pain    HPI Frances Mccormick is a 32 y.o. female with a history of Crohn's who presents emergency department today for abdominal pain, nausea, vomiting and diarrhea.  Patient reports this morning she awoke with diffuse lower abdominal pain, nausea, non-bilious/nonbloody emesis (greater then 5 episodes with last 5 minutes ago), and nonbloody/non-melanous diarrhea x2.  She reports her abdominal pain is both sharp and cramping in nature.  It is constant since onset and rates it as a 7/10.  She knows nothing makes her symptoms better or worse.  She thought she may have strep throat so she has been taking expired amoxicillin over the last 1 week.  She states she is not sure if she is pregnant.  Her last menstrual cycle was on 06/27/2018. Patient is sexually active with 1 female partner and does not use protection. Patient has not taken anything for symptoms.  She reports that this feels like a flare of her Crohn's.  She is not currently on any medication for her Crohn's.  She was previously followed by Dr. Benson Norway but states she lost her insurance and has not followed up in greater than 1 year.  Her last flare was approximately 1 year ago.  Patient denies any fever, chills, chest pain, shortness of breath, flank pain, upper abdominal pain, urinary frequency, urinary urgency, dysuria, hematuria, pelvic pain, vaginal discharge, vaginal bleeding.  She denies contacts with persons with similar symptoms, ingestion of suspicious foods or waters, history of recent foreign travel.   HPI  Past Medical History:  Diagnosis Date  . Crohn's disease ([place])   . Non-compliant patient     Patient Active Problem List   Diagnosis Date Noted  . Intractable vomiting with nausea 10/28/2014  . Medically noncompliant 10/26/2014    . Crohn's disease ([place]) 10/26/2014    Past Surgical History:  Procedure Laterality Date  . INDUCED ABORTION       OB History   None      Home Medications    Prior to Admission medications   Medication Sig Start Date End Date Taking? Authorizing Provider  acetaminophen (TYLENOL) 650 MG CR tablet Take 650 mg by mouth every 8 (eight) hours as needed for pain.    [provider]  diphenhydrAMINE (BENADRYL) 25 MG tablet Take 1 tablet (25 mg total) by mouth every 6 (six) hours. 07/08/18   Ashley Murrain, NP  predniSONE (DELTASONE) 10 MG tablet Take 4 tablets (40 mg total) by mouth daily with breakfast. 07/08/18   Ashley Murrain, NP  ranitidine (ZANTAC) 150 MG tablet Take 1 tablet (150 mg total) by mouth 2 (two) times daily. 07/08/18   Ashley Murrain, NP    Family History No family history on file.  Social History Social History   Tobacco Use  . Smoking status: Never Smoker  . Smokeless tobacco: Never Used  Substance Use Topics  . Alcohol use: Yes    Comment: ocassionally  . Drug use: No     Allergies   Bee venom and Ciprofloxacin   Review of Systems Review of Systems  All other systems reviewed and are negative.    Physical Exam Updated Vital Signs BP (!) 166/74   Pulse (!) 56   Temp 98.6 F (37 C) (Oral)   Resp 18  Ht 5' (1.524 m)   Wt 90.7 kg   LMP 06/27/2018   SpO2 100%   BMI 39.06 kg/m   Physical Exam  Constitutional: She appears well-developed and well-nourished.  HENT:  Head: Normocephalic and atraumatic.  Right Ear: External ear normal.  Left Ear: External ear normal.  Nose: Nose normal.  Mouth/Throat: Uvula is midline, oropharynx is clear and moist and mucous membranes are normal. No tonsillar exudate.  Eyes: Pupils are equal, round, and reactive to light. Right eye exhibits no discharge. Left eye exhibits no discharge. No scleral icterus.  Neck: Trachea normal. Neck supple. No spinous process tenderness present. No neck rigidity. Normal  range of motion present.  Cardiovascular: Normal rate, regular rhythm and intact distal pulses.  No murmur heard. Pulses:      Radial pulses are 2+ on the right side, and 2+ on the left side.       Dorsalis pedis pulses are 2+ on the right side, and 2+ on the left side.       Posterior tibial pulses are 2+ on the right side, and 2+ on the left side.  No lower extremity swelling or edema. Calves symmetric in size bilaterally.  Pulmonary/Chest: Effort normal and breath sounds normal. She exhibits no tenderness.  Abdominal: Soft. Bowel sounds are normal. She exhibits no distension. There is tenderness in the right lower quadrant, suprapubic area and left lower quadrant. There is no rigidity, no rebound, no guarding, no CVA tenderness, no tenderness at McBurney's point and negative Murphy's sign.  Genitourinary:  Genitourinary Comments: Exam performed by Jillyn Ledger, exam chaperoned Pelvic exam: normal external genitalia without evidence of trauma. VULVA: normal appearing vulva with no masses, tenderness or lesion. VAGINA: normal appearing vagina with normal color and discharge, no lesions. CERVIX: normal appearing cervix without lesions, cervical motion tenderness absent, cervical os closed with out bleeding; vaginal discharge - white and creamy, Wet prep and DNA probe for chlamydia and GC obtained.   ADNEXA: normal adnexa in size and no masses. Mild tenderness bilaterally.  UTERUS: uterus is normal size, shape, consistency and nontender.   Musculoskeletal: She exhibits no edema.  Lymphadenopathy:    She has no cervical adenopathy.  Neurological: She is alert.  Skin: Skin is warm and dry. No rash noted. She is not diaphoretic.  Psychiatric: She has a normal mood and affect.  Nursing note and vitals reviewed.    ED Treatments / Results  Labs (all labs ordered are listed, but only abnormal results are displayed) Labs Reviewed  WET PREP, GENITAL - Abnormal; Notable for the following  components:      Result Value   Trich, Wet Prep PRESENT (*)    Clue Cells Wet Prep HPF POC PRESENT (*)    WBC, Wet Prep HPF POC MANY (*)    All other components within normal limits  COMPREHENSIVE METABOLIC PANEL - Abnormal; Notable for the following components:   Glucose, Bld 135 (*)    Total Protein 8.3 (*)    All other components within normal limits  CBC WITH DIFFERENTIAL/PLATELET - Abnormal; Notable for the following components:   WBC 22.7 (*)    RBC 5.26 (*)    RDW 16.0 (*)    Platelets 425 (*)    Neutro Abs 19.8 (*)    Monocytes Absolute 1.1 (*)    All other components within normal limits  URINALYSIS, ROUTINE W REFLEX MICROSCOPIC - Abnormal; Notable for the following components:   APPearance CLOUDY (*)  Ketones, ur 5 (*)    Leukocytes, UA SMALL (*)    Bacteria, UA MANY (*)    All other components within normal limits  HCG, QUANTITATIVE, PREGNANCY - Abnormal; Notable for the following components:   hCG, Beta Chain, Quant, S 123 (*)    All other components within normal limits  POC URINE PREG, ED - Abnormal; Notable for the following components:   Preg Test, Ur POSITIVE (*)    All other components within normal limits  URINE CULTURE  LIPASE, BLOOD  RPR  HIV ANTIBODY (ROUTINE TESTING)  I-STAT BETA HCG BLOOD, ED ([facility], [facility], [facility] ONLY)  ABO/RH  GC/CHLAMYDIA PROBE AMP ([place]) NOT AT [facility]    EKG None  Radiology No results found.  Procedures Procedures (including critical care time)  Medications Ordered in ED Medications  acetaminophen (TYLENOL) tablet 1,000 mg (0 mg Oral Hold 07/26/18 1430)  sodium chloride 0.9 % bolus 1,000 mL (1,000 mLs Intravenous New Bag/Given 07/26/18 1508)  metroNIDAZOLE (FLAGYL) tablet 2,000 mg (has no administration in time range)  promethazine (PHENERGAN) injection 25 mg (has no administration in time range)  sodium chloride 0.9 % bolus 500 mL (has no administration in time range)  cefTRIAXone (ROCEPHIN) 1 g in sodium chloride 0.9 %  100 mL IVPB (has no administration in time range)  ondansetron (ZOFRAN) injection 4 mg (4 mg Intravenous Given 07/26/18 1340)  pyridOXINE (B-6) injection 10 mg (10 mg Intravenous Given 07/26/18 1425)  doxylamine (Sleep) (UNISOM) tablet 12.5 mg (12.5 mg Oral Given 07/26/18 1429)     Initial Impression / Assessment and Plan / ED Course  I have reviewed the triage vital signs and the nursing notes.  Pertinent labs & imaging results that were available during my care of the patient were reviewed by me and considered in my medical decision making (see chart for details).  Clinical Course as of Jul 27 1523  Mon Jul 26, 2018  1520 Patient care handoff report.    [SJ]    Clinical Course User Index [SJ] Joy, Shawn C, PA-C    32 year old female with history of Crohn's disease presenting with lower abdominal pain, nausea, vomiting and diarrhea.  She states this is similar to prior Crohn's flares however usually she has bloody diarrhea.  She states this may also be secondary to being pregnant.  She states her last menstrual cycle was on 06/27/2018.  She is sexually active with one female partner and does not use protection.  Patient vital signs are reassuring on presentation.  Abdominal exam is with diffuse lower abdominal tenderness without any peritoneal signs.  Patient denies any vaginal bleeding.  Point-of-care pregnancy test is positive.  Appropriate labs ordered. IVF given. Discussed with attending about nausea medication. Will order zofran.   Pelvic exam with white, thick discharge.  Cervical os is closed without bleeding or discharge.  No cervical motion tenderness.  Mild adnexal tenderness.  Given lower abdominal pain will obtain ultrasound rule out ectopic vs tubo-ovarian abscess.  Patient's hCG is 123.  Patient with a leukocytosis of 22,700.  No anemia.  Lipase within normal limits.  UA with question of UTI.  Urine culture sent.  Will treat given patient is pregnant.  CMP is reassuring.  Wet prep  positive for trichomonas and BV.  Patient advised of findings and states understanding.  Did offer for results to be discussed in privacy, she states she is okay with her mom knowing dx.   3:24 PM Spoke with pharmacist Sharyn Lull who recommended one  time 2g dose of Flagyl for treatment of BV and Trich. Recommended 1g IV Ceftriaxone and outpatient Keflex for UTI.   3:27 PM Patient in Ultrasound. Updated on plan and in agreement. Case signed out to [facility], [place].   Patient with continued nausea and vomiting despite Zofran, and Diclegis. Called pharmacy in regards to additional nausea medication. They will touch base with women's pharmacist and consult back.   Consulted back by pharmacist who states Phenergan in pregnancy. Ordered. Care taken over by [facility]. Case discussed with my attending who is in agreement.   Final Clinical Impressions(s) / ED Diagnoses   Final diagnoses:  Less than [redacted] weeks gestation of pregnancy  Nausea and vomiting in pregnancy  Bacterial vaginosis  Trichimoniasis  Cystitis  Lower abdominal pain    ED Discharge Orders    None       Jillyn Ledger, PA-C 07/26/18 1556    Maczis, Barth Kirks, PA-C 07/26/18 1557    Lennice Sites, DO 08/09/18 [phone]

## 2018-07-26 NOTE — ED Notes (Signed)
Bed: WA08 Expected date:  Expected time:  Means of arrival:  Comments: EMS-crohn's flare up

## 2018-07-26 NOTE — Discharge Instructions (Addendum)
Antibiotics for bacteriuria in pregnancy  OB/GYN follow-up in 48 hours for repeat hCG

## 2018-07-27 ENCOUNTER — Encounter (HOSPITAL_COMMUNITY): Payer: Self-pay

## 2018-07-27 ENCOUNTER — Encounter (HOSPITAL_COMMUNITY): Payer: Self-pay | Admitting: General Practice

## 2018-07-27 ENCOUNTER — Observation Stay (HOSPITAL_COMMUNITY): Payer: Medicaid Other

## 2018-07-27 ENCOUNTER — Other Ambulatory Visit: Payer: Self-pay

## 2018-07-27 DIAGNOSIS — A599 Trichomoniasis, unspecified: Secondary | ICD-10-CM

## 2018-07-27 DIAGNOSIS — B9689 Other specified bacterial agents as the cause of diseases classified elsewhere: Secondary | ICD-10-CM

## 2018-07-27 DIAGNOSIS — N3090 Cystitis, unspecified without hematuria: Secondary | ICD-10-CM

## 2018-07-27 DIAGNOSIS — R112 Nausea with vomiting, unspecified: Principal | ICD-10-CM

## 2018-07-27 DIAGNOSIS — N760 Acute vaginitis: Secondary | ICD-10-CM

## 2018-07-27 DIAGNOSIS — O039 Complete or unspecified spontaneous abortion without complication: Secondary | ICD-10-CM

## 2018-07-27 DIAGNOSIS — Z3A01 Less than 8 weeks gestation of pregnancy: Secondary | ICD-10-CM

## 2018-07-27 LAB — CBC
HEMATOCRIT: 40.3 % (ref 36.0–46.0)
Hemoglobin: 13.1 g/dL (ref 12.0–15.0)
MCH: 26 pg (ref 26.0–34.0)
MCHC: 32.5 g/dL (ref 30.0–36.0)
MCV: 80.1 fL (ref 78.0–100.0)
PLATELETS: 393 10*3/uL (ref 150–400)
RBC: 5.03 MIL/uL (ref 3.87–5.11)
RDW: 15.7 % — ABNORMAL HIGH (ref 11.5–15.5)
WBC: 18 10*3/uL — ABNORMAL HIGH (ref 4.0–10.5)

## 2018-07-27 LAB — GC/CHLAMYDIA PROBE AMP (~~LOC~~) NOT AT ARMC
CHLAMYDIA, DNA PROBE: POSITIVE — AB
NEISSERIA GONORRHEA: NEGATIVE

## 2018-07-27 LAB — URINE CULTURE

## 2018-07-27 LAB — HIV ANTIBODY (ROUTINE TESTING W REFLEX): HIV Screen 4th Generation wRfx: NONREACTIVE

## 2018-07-27 LAB — RPR: RPR Ser Ql: NONREACTIVE

## 2018-07-27 MED ORDER — METRONIDAZOLE 500 MG PO TABS
500.0000 mg | ORAL_TABLET | Freq: Two times a day (BID) | ORAL | Status: DC
  Administered 2018-07-27 (×2): 500 mg via ORAL
  Filled 2018-07-27 (×2): qty 1

## 2018-07-27 MED ORDER — CEPHALEXIN 500 MG PO CAPS: 500.0000 mg | ORAL_CAPSULE | Freq: Two times a day (BID) | ORAL | 0 refills | Status: DC

## 2018-07-27 MED ORDER — PROMETHAZINE HCL 12.5 MG PO TABS: 12.5000 mg | ORAL_TABLET | Freq: Three times a day (TID) | ORAL | 0 refills | Status: DC | PRN

## 2018-07-27 MED ORDER — COMPLETENATE 29-1 MG PO CHEW
1.0000 | CHEWABLE_TABLET | Freq: Every day | ORAL | Status: DC
  Filled 2018-07-27 (×3): qty 1

## 2018-07-27 MED ORDER — PRENATAL MULTIVITAMIN CH
1.0000 | ORAL_TABLET | Freq: Every day | ORAL | Status: DC
  Administered 2018-07-27: 1 via ORAL
  Filled 2018-07-27: qty 1

## 2018-07-27 MED ORDER — COMPLETENATE 29-1 MG PO CHEW: 1.0000 | CHEWABLE_TABLET | Freq: Every day | ORAL | 0 refills | Status: DC

## 2018-07-27 MED ORDER — PYRIDOXINE HCL 25 MG PO TABS: 25.0000 mg | ORAL_TABLET | Freq: Three times a day (TID) | ORAL | 0 refills | Status: DC

## 2018-07-27 MED ORDER — VITAMIN B-6 25 MG PO TABS
25.0000 mg | ORAL_TABLET | Freq: Three times a day (TID) | ORAL | Status: DC
  Administered 2018-07-27: 25 mg via ORAL
  Filled 2018-07-27 (×2): qty 1

## 2018-07-27 MED ORDER — METRONIDAZOLE 500 MG PO TABS: 500.0000 mg | ORAL_TABLET | Freq: Two times a day (BID) | ORAL | 0 refills | Status: DC

## 2018-07-27 NOTE — Progress Notes (Signed)
PROGRESS NOTE        PATIENT DETAILS Name: Frances Mccormick Age: 32 y.o. Sex: female Date of Birth: Jan 16, 1986 Admit Date: 07/26/2018 Admitting Physician Jani Gravel, MD OIZ:[id number], No Pcp Per  Brief Narrative: Patient is a 32 y.o. female with prior history of Crohn's disease-not on any anti-inflammatories for the past few years points-admitted for evaluation of intractable vomiting.  Further evaluation revealed possible early pregnancy, there was concern for acute appendicitis-patient was then transferred to [facility] for a MRI.  See below for further details.  Subjective: Denies any abdominal pain.  No diarrhea.  No vomiting has essentially resolved-diet being slowly advanced.  Assessment/Plan: Intractable nausea with vomiting: Significantly better this morning, MRI of the abdomen did not show acute appendicitis or any evidence of active Crohn's colitis.  Suspect her symptoms could be from a viral syndrome, or nausea and vomiting related to early pregnancy.  Advance diet-start pyridoxine and continue with Phenergan.  If she continues to improve-should be stable for discharge later today.  Possible early pregnancy: Urine pregnancy test positive-but no fetus seen on ultrasound.  Could be very early intrauterine pregnancy-spoke with Dr. Vashti Hey on-call-recommends repeat beta hCG if patient is still here tomorrow, otherwise she will arrange for a follow-up for repeat blood work tomorrow at the [facility].  She will also arrange for outpatient ultrasound in 1 week.  Due to possibility of vomiting related to pregnancy-she advises that we continue with pyridoxine and Phenergan on discharge.  UTI: Await cultures-continue Rocephin-we will transition to Keflex on discharge if no further vomiting.  She appears nontoxic-leukocytosis has decreased.  She is afebrile.  Trichomoniasis: Continue Flagyl.  Have asked patient to notify her partner.  Bacterial  vaginosis: Continue Flagyl  DVT Prophylaxis: SCDs  Code Status: Full code   Family Communication: None at bedside  Disposition Plan: Remain inpatient-home later today  Antimicrobial agents: Anti-infectives (From admission, onward)   Start     Dose/Rate Route Frequency Ordered Stop   07/27/18 1600  cefTRIAXone (ROCEPHIN) 1 g in sodium chloride 0.9 % 100 mL IVPB     1 g 200 mL/hr over 30 Minutes Intravenous Every 24 hours 07/26/18 2230     07/27/18 1000  metroNIDAZOLE (FLAGYL) tablet 500 mg  Status:  Discontinued     500 mg Oral Every 12 hours 07/26/18 2232 07/27/18 0035   07/27/18 0045  metroNIDAZOLE (FLAGYL) tablet 500 mg     500 mg Oral Every 12 hours 07/27/18 0035     07/26/18 2030  metroNIDAZOLE (FLAGYL) tablet 2,000 mg     2,000 mg Oral  Once 07/26/18 2029     07/26/18 1600  cefTRIAXone (ROCEPHIN) 1 g in sodium chloride 0.9 % 100 mL IVPB     1 g 200 mL/hr over 30 Minutes Intravenous  Once 07/26/18 1552 07/26/18 1657   07/26/18 1530  metroNIDAZOLE (FLAGYL) tablet 2,000 mg     2,000 mg Oral  Once 07/26/18 1526 07/26/18 1604   07/26/18 1530  cefTAZidime (FORTAZ) 1 g in sodium chloride 0.9 % 100 mL IVPB  Status:  Discontinued     1 g 200 mL/hr over 30 Minutes Intravenous  Once 07/26/18 1526 07/26/18 1639      Procedures: None  CONSULTS: Phone consultation with Dr Roland Rack over the phone  Time spent: 25- minutes-Greater than 50% of this time was spent  in counseling, explanation of diagnosis, planning of further management, and coordination of care.  MEDICATIONS: Scheduled Meds: . acetaminophen  1,000 mg Oral Once  . metroNIDAZOLE  2,000 mg Oral Once  . metroNIDAZOLE  500 mg Oral Q12H  . vitamin B-6  25 mg Oral Q8H   Continuous Infusions: . cefTRIAXone (ROCEPHIN)  IV     PRN Meds:.acetaminophen **OR** acetaminophen, ondansetron (ZOFRAN) IV, promethazine   PHYSICAL EXAM: Vital signs: Vitals:   07/26/18 2230 07/26/18 2300 07/27/18 0014 07/27/18 0549  BP:  121/71 109/63 107/67 (!) 95/57  Pulse: 63 (!) 59 66 66  Resp: 14  16 18   Temp:   98.8 F (37.1 C) 98.4 F (36.9 C)  TempSrc:   Oral Oral  SpO2: 99% 99% 100% 100%  Weight:   87.8 kg   Height:   5' (1.524 m)    Filed Weights   07/26/18 1158 07/27/18 0014  Weight: 90.7 kg 87.8 kg   Body mass index is 37.8 kg/m.   General appearance :Awake, alert, not in any distress.  Eyes:Pink conjunctiva HEENT: Atraumatic and Normocephalic Neck: supple, no JVD. No cervical lymphadenopathy. No thyromegaly Resp:Good air entry bilaterally, no added sounds  CVS: S1 S2 regular, no murmurs.  GI: Bowel sounds present, Non tender and not distended with no gaurding, rigidity or rebound.No organomegaly Extremities: B/L Lower Ext shows no edema, both legs are warm to touch Neurology:  speech clear,Non focal, sensation is grossly intact. Psychiatric: Normal judgment and insight. Alert and oriented x 3. Normal mood. Musculoskeletal:No digital cyanosis Skin:No Rash, warm and dry Wounds:N/A  I have personally reviewed following labs and imaging studies  LABORATORY DATA: CBC: Recent Labs  Lab 07/26/18 1307 07/27/18 0857  WBC 22.7* 18.0*  NEUTROABS 19.8*  --   HGB 14.3 13.1  HCT 41.9 40.3  MCV 79.7 80.1  PLT 425* 035    Basic Metabolic Panel: Recent Labs  Lab 07/26/18 1307  NA 140  K 3.7  CL 105  CO2 24  GLUCOSE 135*  BUN 12  CREATININE 0.77  CALCIUM 9.2    GFR: Estimated Creatinine Clearance: 99.5 mL/min (by C-G formula based on SCr of 0.77 mg/dL).  Liver Function Tests: Recent Labs  Lab 07/26/18 1307  AST 19  ALT 14  ALKPHOS 60  BILITOT 0.3  PROT 8.3*  ALBUMIN 4.2   Recent Labs  Lab 07/26/18 1307  LIPASE 27   No results for input(s): AMMONIA in the last 168 hours.  Coagulation Profile: No results for input(s): INR, PROTIME in the last 168 hours.  Cardiac Enzymes: No results for input(s): CKTOTAL, CKMB, CKMBINDEX, TROPONINI in the last 168 hours.  BNP (last 3  results) No results for input(s): PROBNP in the last 8760 hours.  HbA1C: No results for input(s): HGBA1C in the last 72 hours.  CBG: No results for input(s): GLUCAP in the last 168 hours.  Lipid Profile: No results for input(s): CHOL, HDL, LDLCALC, TRIG, CHOLHDL, LDLDIRECT in the last 72 hours.  Thyroid Function Tests: No results for input(s): TSH, T4TOTAL, FREET4, T3FREE, THYROIDAB in the last 72 hours.  Anemia Panel: No results for input(s): VITAMINB12, FOLATE, FERRITIN, TIBC, IRON, RETICCTPCT in the last 72 hours.  Urine analysis:    Component Value Date/Time   COLORURINE YELLOW 07/26/2018 1307   APPEARANCEUR CLOUDY (A) 07/26/2018 1307   LABSPEC 1.015 07/26/2018 1307   PHURINE 7.0 07/26/2018 1307   GLUCOSEU NEGATIVE 07/26/2018 1307   HGBUR NEGATIVE 07/26/2018 [place] 07/26/2018 1307  KETONESUR 5 (A) 07/26/2018 1307   PROTEINUR NEGATIVE 07/26/2018 1307   UROBILINOGEN 0.2 06/08/2015 0736   NITRITE NEGATIVE 07/26/2018 1307   LEUKOCYTESUR SMALL (A) 07/26/2018 1307    Sepsis Labs: Lactic Acid, Venous    Component Value Date/Time   LATICACIDVEN 0.77 04/13/2015 2017    MICROBIOLOGY: Recent Results (from the past 240 hour(s))  Wet prep, genital     Status: Abnormal   Collection Time: 07/26/18  1:50 PM  Result Value Ref Range Status   Yeast Wet Prep HPF POC NONE SEEN NONE SEEN Final   Trich, Wet Prep PRESENT (A) NONE SEEN Final   Clue Cells Wet Prep HPF POC PRESENT (A) NONE SEEN Final   WBC, Wet Prep HPF POC MANY (A) NONE SEEN Final   Sperm NONE SEEN  Final    Comment: Performed at [facility], [place] [address] [place], [place] [address]  Group A Strep by PCR     Status: None   Collection Time: 07/26/18  7:54 PM  Result Value Ref Range Status   Group A Strep by PCR NOT DETECTED NOT DETECTED Final    Comment: Performed at [facility], [place] [address], [place], [place] [address]    RADIOLOGY  STUDIES/RESULTS: Mr Pelvis Wo Contrast  Result Date: 07/27/2018 CLINICAL DATA:  History of Crohn's disease with abdominal pain and nausea and vomiting. EXAM: MRI ABDOMEN AND PELVIS WITHOUT CONTRAST TECHNIQUE: Multiplanar multisequence MR imaging of the abdomen and pelvis was performed. No intravenous contrast was administered. COMPARISON:  Ultrasound 07/26/2018 and CT scan 12/09/2016 FINDINGS: COMBINED FINDINGS FOR BOTH MR ABDOMEN AND PELVIS Lower chest: The lung bases are grossly clear. No worrisome pulmonary lesions, pleural or pericardial effusion. Hepatobiliary: No hepatic lesions are identified. No intra or extrahepatic biliary dilatation. The gallbladder is normal. Pancreas:  No mass, inflammation or ductal dilatation. Spleen:  Normal size.  No focal lesions. Adrenals/Urinary Tract: The adrenal glands and kidneys are unremarkable. No hydronephrosis or renal lesions. The bladder is mildly distended but no bladder lesions or asymmetric bladder wall thickening. Stomach/Bowel: The stomach, duodenum, small bowel and colon are grossly normal. No findings for acute inflammatory process, mass lesions or obstructive findings. The terminal ileum is grossly normal without IV contrast. The appendix is normal. Vascular/Lymphatic: No pathologically enlarged lymph nodes identified. No abdominal aortic aneurysm demonstrated. Reproductive: The uterus and ovaries are normal. Multiple nabothian cysts are noted at the cervix. Other:  Small amount of free abdominal/pelvic fluid. Musculoskeletal: No significant bony findings. IMPRESSION: 1. No acute abdominal/pelvic findings, mass lesions or lymphadenopathy. 2. No MR findings without contrast to suggest acute/active Crohn's disease. The appendix and ovaries are unremarkable. 3. Small amount of free abdominal/pelvic fluid which may be physiologic. Electronically Signed   By: Marijo Sanes M.D.   On: 07/27/2018 07:29   Mr Abdomen Wo Contrast  Result Date: 07/27/2018 CLINICAL  DATA:  History of Crohn's disease with abdominal pain and nausea and vomiting. EXAM: MRI ABDOMEN AND PELVIS WITHOUT CONTRAST TECHNIQUE: Multiplanar multisequence MR imaging of the abdomen and pelvis was performed. No intravenous contrast was administered. COMPARISON:  Ultrasound 07/26/2018 and CT scan 12/09/2016 FINDINGS: COMBINED FINDINGS FOR BOTH MR ABDOMEN AND PELVIS Lower chest: The lung bases are grossly clear. No worrisome pulmonary lesions, pleural or pericardial effusion. Hepatobiliary: No hepatic lesions are identified. No intra or extrahepatic biliary dilatation. The gallbladder is normal. Pancreas:  No mass, inflammation or ductal dilatation. Spleen:  Normal size.  No focal lesions. Adrenals/Urinary Tract: The  adrenal glands and kidneys are unremarkable. No hydronephrosis or renal lesions. The bladder is mildly distended but no bladder lesions or asymmetric bladder wall thickening. Stomach/Bowel: The stomach, duodenum, small bowel and colon are grossly normal. No findings for acute inflammatory process, mass lesions or obstructive findings. The terminal ileum is grossly normal without IV contrast. The appendix is normal. Vascular/Lymphatic: No pathologically enlarged lymph nodes identified. No abdominal aortic aneurysm demonstrated. Reproductive: The uterus and ovaries are normal. Multiple nabothian cysts are noted at the cervix. Other:  Small amount of free abdominal/pelvic fluid. Musculoskeletal: No significant bony findings. IMPRESSION: 1. No acute abdominal/pelvic findings, mass lesions or lymphadenopathy. 2. No MR findings without contrast to suggest acute/active Crohn's disease. The appendix and ovaries are unremarkable. 3. Small amount of free abdominal/pelvic fluid which may be physiologic. Electronically Signed   By: Marijo Sanes M.D.   On: 07/27/2018 07:29   US Ob Comp < 14 Wks  Result Date: 07/26/2018 CLINICAL DATA:  Lower abdominal pain. Vomiting. Beta HCG 123. Gestational age by last  menstrual. Three weeks and 4 days. EXAM: OBSTETRIC <14 WK [place] AND TRANSVAGINAL OB US TECHNIQUE: Both transabdominal and transvaginal ultrasound examinations were performed for complete evaluation of the gestation as well as the maternal uterus, adnexal regions, and pelvic cul-de-sac. Transvaginal technique was performed to assess early pregnancy. COMPARISON:  None. FINDINGS: Intrauterine gestational sac: Not present Yolk sac:  Not present with marginal vascularity. Embryo:  Not present Cardiac Activity: Not present Subchorionic hemorrhage:  None visualized. Maternal uterus/adnexae: Multiple nabothian cysts at the level of the cervix. Small volume free fluid in the pelvis. RIGHT intra-ovarian isoechoic 16 mm corpus luteal cyst. LEFT ovary not sonographically identified. IMPRESSION: 1. Pregnancy of unknown anatomic location (no intrauterine gestational sac or adnexal mass identified). Differential diagnosis includes recent spontaneous miscarriage, IUP too early to visualize, and non-visualized ectopic pregnancy. Recommend correlation with serial beta-hCG levels, and follow up US if warranted clinically. Please note, with beta HCG of less than 3,000, pregnancy may not be sonographically evident. Electronically Signed   By: Elon Alas M.D.   On: 07/26/2018 18:53   US Ob Transvaginal  Result Date: 07/26/2018 CLINICAL DATA:  Lower abdominal pain. Vomiting. Beta HCG 123. Gestational age by last menstrual. Three weeks and 4 days. EXAM: OBSTETRIC <14 WK [place] AND TRANSVAGINAL OB US TECHNIQUE: Both transabdominal and transvaginal ultrasound examinations were performed for complete evaluation of the gestation as well as the maternal uterus, adnexal regions, and pelvic cul-de-sac. Transvaginal technique was performed to assess early pregnancy. COMPARISON:  None. FINDINGS: Intrauterine gestational sac: Not present Yolk sac:  Not present with marginal vascularity. Embryo:  Not present Cardiac Activity: Not present  Subchorionic hemorrhage:  None visualized. Maternal uterus/adnexae: Multiple nabothian cysts at the level of the cervix. Small volume free fluid in the pelvis. RIGHT intra-ovarian isoechoic 16 mm corpus luteal cyst. LEFT ovary not sonographically identified. IMPRESSION: 1. Pregnancy of unknown anatomic location (no intrauterine gestational sac or adnexal mass identified). Differential diagnosis includes recent spontaneous miscarriage, IUP too early to visualize, and non-visualized ectopic pregnancy. Recommend correlation with serial beta-hCG levels, and follow up US if warranted clinically. Please note, with beta HCG of less than 3,000, pregnancy may not be sonographically evident. Electronically Signed   By: Elon Alas M.D.   On: 07/26/2018 18:53     LOS: 0 days   Oren Binet, MD  [facility]  If 7PM-7AM, please contact night-coverage  Please page via [email or web address] [email or web address]-click on MD name and type text  message  07/27/2018, 1:19 PM

## 2018-07-27 NOTE — Discharge Summary (Addendum)
PATIENT DETAILS Name: Frances Mccormick Age: 32 y.o. Sex: female Date of Birth: Feb 06, 1986 MRN: [id number]. Admitting Physician: Frances Gravel, MD NWG:[id number], No Pcp Per  Admit Date: 07/26/2018 Discharge date: 07/27/2018  Recommendations for Outpatient Follow-up:  1. Follow up woman's clinic on 8/28 for repeat beta-hCG.  Admitted From:  Home  Disposition: [place]: No  Equipment/Devices: None  Discharge Condition: Stable  CODE STATUS: FULL CODE  Diet recommendation:  Stay on a full liquid/soft diet for a few days before advancing to regular diet.  Brief Summary: See H&P, Labs, Consult and Test reports for all details in brief, Patient is a 32 y.o. female with prior history of Crohn's disease-not on any anti-inflammatories for the past few years points-admitted for evaluation of intractable vomiting.  Further evaluation revealed possible early pregnancy, there was concern for acute appendicitis-patient was then transferred to [facility] for a MRI.  See below for further details.  Brief Hospital Course: Intractable nausea with vomiting: Significantly better this morning, MRI of the abdomen did not show acute appendicitis or any evidence of active Crohn's colitis.  Suspect her symptoms could be from a viral syndrome, or nausea and vomiting related to early pregnancy.  Diet was advanced which she is tolerating so far-if she continues to tolerate advancement in diet she will be discharged home later today.  Continue with pyridoxine and Phenergan on discharge.  Possible early pregnancy: Urine pregnancy test positive-but no fetus seen on ultrasound.  Could be very early intrauterine pregnancy (or rarely a ectopic pregnancy)-spoke with Dr. Vashti Mccormick on-call-recommends repeat beta hCG on 8/28.  She will arrange follow-up for blood work tomorrow.  She will also arrange for outpatient ultrasound in 1 week.  Due to possibility of vomiting related to pregnancy-she  advises that we continue with pyridoxine and Phenergan on discharge.  UTI: Managed with IV Rocephin-we will transition to Keflex on discharge now that vomiting has essentially resolved.  She really does not have a lot of symptoms of UTI-in any event she is pregnant and will need to be treated.  She is afebrile-leukocytosis is downtrending-she is significantly clinically improved-has a nontoxic appearance.  Trichomoniasis: Continue Flagyl.  Have asked patient to notify her partner.  Bacterial vaginosis: Continue Flagyl  Procedures/Studies: None  Discharge Diagnoses:  Active Problems:   Nausea & vomiting   Discharge Instructions:  Activity:  As tolerated  Discharge Instructions    Diet general   Complete by:  As directed    Discharge instructions   Complete by:  As directed    Follow-up with women's clinic on 8/28 for repeat blood work (to recheck beta hCG) they should be calling you with a follow-up appointment, if you do not hear from them, please go to the [facility] for blood work.  Follow with Primary MD  Patient in 1 week  Please get a complete blood count and chemistry panel checked by your Primary MD at your next visit, and again as instructed by your Primary MD.  Get Medicines reviewed and adjusted: Please take all your medications with you for your next visit with your Primary MD  Laboratory/radiological data: Please request your Primary MD to go over all hospital tests and procedure/radiological results at the follow up, please ask your Primary MD to get all Hospital records sent to his/her office.  In some cases, they will be blood work, cultures and biopsy results pending at the time of your discharge. Please request that your primary care M.D. follows  up on these results.  Also Note the following: If you experience worsening of your admission symptoms, develop shortness of breath, life threatening emergency, suicidal or homicidal thoughts you must seek  medical attention immediately by calling 911 or calling your MD immediately  if symptoms less severe.  You must read complete instructions/literature along with all the possible adverse reactions/side effects for all the Medicines you take and that have been prescribed to you. Take any new Medicines after you have completely understood and accpet all the possible adverse reactions/side effects.   Do not drive when taking Pain medications or sleeping medications (Benzodaizepines)  Do not take more than prescribed Pain, Sleep and Anxiety Medications. It is not advisable to combine anxiety,sleep and pain medications without talking with your primary care practitioner  Special Instructions: If you have smoked or chewed Tobacco  in the last 2 yrs please stop smoking, stop any regular Alcohol  and or any Recreational drug use.  Wear Seat belts while driving.  Please note: You were cared for by a hospitalist during your hospital stay. Once you are discharged, your primary care physician will handle any further medical issues. Please note that NO REFILLS for any discharge medications will be authorized once you are discharged, as it is imperative that you return to your primary care physician (or establish a relationship with a primary care physician if you do not have one) for your post hospital discharge needs so that they can reassess your need for medications and monitor your lab values.   Increase activity slowly   Complete by:  As directed      Allergies as of 07/27/2018      Reactions   Bee Venom Anaphylaxis   Ciprofloxacin Anaphylaxis      Medication List    STOP taking these medications   diphenhydrAMINE 25 MG tablet Commonly known as:  BENADRYL   predniSONE 10 MG tablet Commonly known as:  DELTASONE   ranitidine 150 MG tablet Commonly known as:  ZANTAC     TAKE these medications   cephALEXin 500 MG capsule Commonly known as:  KEFLEX Take 1 capsule (500 mg total) by mouth 2  (two) times daily.   metroNIDAZOLE 500 MG tablet Commonly known as:  FLAGYL Take 1 tablet (500 mg total) by mouth 2 (two) times daily.   prenatal vitamin w/FE, FA 29-1 MG Chew chewable tablet Chew 1 tablet by mouth daily at 12 noon.   promethazine 12.5 MG tablet Commonly known as:  PHENERGAN Take 1 tablet (12.5 mg total) by mouth every 8 (eight) hours as needed for nausea or vomiting.   pyridOXINE 25 MG tablet Commonly known as:  VITAMIN B-6 Take 1 tablet (25 mg total) by mouth every 8 (eight) hours.      Follow-up Information    [place]. Go on 07/28/2018.   Why:  you need a repeat blood work on 8/28-appointment at 1:30 pm Contact information: [place] [phone]         Allergies  Allergen Reactions  . Bee Venom Anaphylaxis  . Ciprofloxacin Anaphylaxis    Consultations:  Phone consultation with GYN   Other Procedures/Studies: Mr Pelvis Wo Contrast  Result Date: 07/27/2018 CLINICAL DATA:  History of Crohn's disease with abdominal pain and nausea and vomiting. EXAM: MRI ABDOMEN AND PELVIS WITHOUT CONTRAST TECHNIQUE: Multiplanar multisequence MR imaging of the abdomen and pelvis was performed. No intravenous contrast was administered. COMPARISON:  Ultrasound 07/26/2018 and CT scan  12/09/2016 FINDINGS: COMBINED FINDINGS FOR BOTH MR ABDOMEN AND PELVIS Lower chest: The lung bases are grossly clear. No worrisome pulmonary lesions, pleural or pericardial effusion. Hepatobiliary: No hepatic lesions are identified. No intra or extrahepatic biliary dilatation. The gallbladder is normal. Pancreas:  No mass, inflammation or ductal dilatation. Spleen:  Normal size.  No focal lesions. Adrenals/Urinary Tract: The adrenal glands and kidneys are unremarkable. No hydronephrosis or renal lesions. The bladder is mildly distended but no bladder lesions or asymmetric bladder wall thickening. Stomach/Bowel: The stomach, duodenum, small  bowel and colon are grossly normal. No findings for acute inflammatory process, mass lesions or obstructive findings. The terminal ileum is grossly normal without IV contrast. The appendix is normal. Vascular/Lymphatic: No pathologically enlarged lymph nodes identified. No abdominal aortic aneurysm demonstrated. Reproductive: The uterus and ovaries are normal. Multiple nabothian cysts are noted at the cervix. Other:  Small amount of free abdominal/pelvic fluid. Musculoskeletal: No significant bony findings. IMPRESSION: 1. No acute abdominal/pelvic findings, mass lesions or lymphadenopathy. 2. No MR findings without contrast to suggest acute/active Crohn's disease. The appendix and ovaries are unremarkable. 3. Small amount of free abdominal/pelvic fluid which may be physiologic. Electronically Signed   By: Marijo Sanes M.D.   On: 07/27/2018 07:29   Mr Abdomen Wo Contrast  Result Date: 07/27/2018 CLINICAL DATA:  History of Crohn's disease with abdominal pain and nausea and vomiting. EXAM: MRI ABDOMEN AND PELVIS WITHOUT CONTRAST TECHNIQUE: Multiplanar multisequence MR imaging of the abdomen and pelvis was performed. No intravenous contrast was administered. COMPARISON:  Ultrasound 07/26/2018 and CT scan 12/09/2016 FINDINGS: COMBINED FINDINGS FOR BOTH MR ABDOMEN AND PELVIS Lower chest: The lung bases are grossly clear. No worrisome pulmonary lesions, pleural or pericardial effusion. Hepatobiliary: No hepatic lesions are identified. No intra or extrahepatic biliary dilatation. The gallbladder is normal. Pancreas:  No mass, inflammation or ductal dilatation. Spleen:  Normal size.  No focal lesions. Adrenals/Urinary Tract: The adrenal glands and kidneys are unremarkable. No hydronephrosis or renal lesions. The bladder is mildly distended but no bladder lesions or asymmetric bladder wall thickening. Stomach/Bowel: The stomach, duodenum, small bowel and colon are grossly normal. No findings for acute inflammatory  process, mass lesions or obstructive findings. The terminal ileum is grossly normal without IV contrast. The appendix is normal. Vascular/Lymphatic: No pathologically enlarged lymph nodes identified. No abdominal aortic aneurysm demonstrated. Reproductive: The uterus and ovaries are normal. Multiple nabothian cysts are noted at the cervix. Other:  Small amount of free abdominal/pelvic fluid. Musculoskeletal: No significant bony findings. IMPRESSION: 1. No acute abdominal/pelvic findings, mass lesions or lymphadenopathy. 2. No MR findings without contrast to suggest acute/active Crohn's disease. The appendix and ovaries are unremarkable. 3. Small amount of free abdominal/pelvic fluid which may be physiologic. Electronically Signed   By: Marijo Sanes M.D.   On: 07/27/2018 07:29   US Ob Comp < 14 Wks  Result Date: 07/26/2018 CLINICAL DATA:  Lower abdominal pain. Vomiting. Beta HCG 123. Gestational age by last menstrual. Three weeks and 4 days. EXAM: OBSTETRIC <14 WK [place] AND TRANSVAGINAL OB US TECHNIQUE: Both transabdominal and transvaginal ultrasound examinations were performed for complete evaluation of the gestation as well as the maternal uterus, adnexal regions, and pelvic cul-de-sac. Transvaginal technique was performed to assess early pregnancy. COMPARISON:  None. FINDINGS: Intrauterine gestational sac: Not present Yolk sac:  Not present with marginal vascularity. Embryo:  Not present Cardiac Activity: Not present Subchorionic hemorrhage:  None visualized. Maternal uterus/adnexae: Multiple nabothian cysts at the level of the cervix. Small  volume free fluid in the pelvis. RIGHT intra-ovarian isoechoic 16 mm corpus luteal cyst. LEFT ovary not sonographically identified. IMPRESSION: 1. Pregnancy of unknown anatomic location (no intrauterine gestational sac or adnexal mass identified). Differential diagnosis includes recent spontaneous miscarriage, IUP too early to visualize, and non-visualized ectopic pregnancy.  Recommend correlation with serial beta-hCG levels, and follow up US if warranted clinically. Please note, with beta HCG of less than 3,000, pregnancy may not be sonographically evident. Electronically Signed   By: Elon Alas M.D.   On: 07/26/2018 18:53   US Ob Transvaginal  Result Date: 07/26/2018 CLINICAL DATA:  Lower abdominal pain. Vomiting. Beta HCG 123. Gestational age by last menstrual. Three weeks and 4 days. EXAM: OBSTETRIC <14 WK [place] AND TRANSVAGINAL OB US TECHNIQUE: Both transabdominal and transvaginal ultrasound examinations were performed for complete evaluation of the gestation as well as the maternal uterus, adnexal regions, and pelvic cul-de-sac. Transvaginal technique was performed to assess early pregnancy. COMPARISON:  None. FINDINGS: Intrauterine gestational sac: Not present Yolk sac:  Not present with marginal vascularity. Embryo:  Not present Cardiac Activity: Not present Subchorionic hemorrhage:  None visualized. Maternal uterus/adnexae: Multiple nabothian cysts at the level of the cervix. Small volume free fluid in the pelvis. RIGHT intra-ovarian isoechoic 16 mm corpus luteal cyst. LEFT ovary not sonographically identified. IMPRESSION: 1. Pregnancy of unknown anatomic location (no intrauterine gestational sac or adnexal mass identified). Differential diagnosis includes recent spontaneous miscarriage, IUP too early to visualize, and non-visualized ectopic pregnancy. Recommend correlation with serial beta-hCG levels, and follow up US if warranted clinically. Please note, with beta HCG of less than 3,000, pregnancy may not be sonographically evident. Electronically Signed   By: Elon Alas M.D.   On: 07/26/2018 18:53     TODAY-DAY OF DISCHARGE:  Subjective:   Frances Mccormick today has no headache,no chest abdominal pain,no new weakness tingling or numbness, feels much better wants to go home today.   Objective:   Blood pressure 99/63, pulse 65, temperature 99.2 F (37.3  C), temperature source Oral, resp. rate 18, height 5' (1.524 m), weight 87.8 kg, last menstrual period 06/27/2018, SpO2 100 %.  Intake/Output Summary (Last 24 hours) at 07/27/2018 1811 Last data filed at 07/27/2018 1759 Gross per 24 hour  Intake 702.59 ml  Output -  Net 702.59 ml   Filed Weights   07/26/18 1158 07/27/18 0014  Weight: 90.7 kg 87.8 kg    Exam: Awake Alert, Oriented *3, No new F.N deficits, Normal affect [place].AT,PERRAL Supple Neck,No JVD, No cervical lymphadenopathy appriciated.  Symmetrical Chest wall movement, Good air movement bilaterally, CTAB RRR,No Gallops,Rubs or new Murmurs, No Parasternal Heave +ve B.Sounds, Abd Soft, Non tender, No organomegaly appriciated, No rebound -guarding or rigidity. No Cyanosis, Clubbing or edema, No new Rash or bruise   PERTINENT RADIOLOGIC STUDIES: Mr Pelvis Wo Contrast  Result Date: 07/27/2018 CLINICAL DATA:  History of Crohn's disease with abdominal pain and nausea and vomiting. EXAM: MRI ABDOMEN AND PELVIS WITHOUT CONTRAST TECHNIQUE: Multiplanar multisequence MR imaging of the abdomen and pelvis was performed. No intravenous contrast was administered. COMPARISON:  Ultrasound 07/26/2018 and CT scan 12/09/2016 FINDINGS: COMBINED FINDINGS FOR BOTH MR ABDOMEN AND PELVIS Lower chest: The lung bases are grossly clear. No worrisome pulmonary lesions, pleural or pericardial effusion. Hepatobiliary: No hepatic lesions are identified. No intra or extrahepatic biliary dilatation. The gallbladder is normal. Pancreas:  No mass, inflammation or ductal dilatation. Spleen:  Normal size.  No focal lesions. Adrenals/Urinary Tract: The adrenal glands and kidneys are  unremarkable. No hydronephrosis or renal lesions. The bladder is mildly distended but no bladder lesions or asymmetric bladder wall thickening. Stomach/Bowel: The stomach, duodenum, small bowel and colon are grossly normal. No findings for acute inflammatory process, mass lesions or obstructive  findings. The terminal ileum is grossly normal without IV contrast. The appendix is normal. Vascular/Lymphatic: No pathologically enlarged lymph nodes identified. No abdominal aortic aneurysm demonstrated. Reproductive: The uterus and ovaries are normal. Multiple nabothian cysts are noted at the cervix. Other:  Small amount of free abdominal/pelvic fluid. Musculoskeletal: No significant bony findings. IMPRESSION: 1. No acute abdominal/pelvic findings, mass lesions or lymphadenopathy. 2. No MR findings without contrast to suggest acute/active Crohn's disease. The appendix and ovaries are unremarkable. 3. Small amount of free abdominal/pelvic fluid which may be physiologic. Electronically Signed   By: Marijo Sanes M.D.   On: 07/27/2018 07:29   Mr Abdomen Wo Contrast  Result Date: 07/27/2018 CLINICAL DATA:  History of Crohn's disease with abdominal pain and nausea and vomiting. EXAM: MRI ABDOMEN AND PELVIS WITHOUT CONTRAST TECHNIQUE: Multiplanar multisequence MR imaging of the abdomen and pelvis was performed. No intravenous contrast was administered. COMPARISON:  Ultrasound 07/26/2018 and CT scan 12/09/2016 FINDINGS: COMBINED FINDINGS FOR BOTH MR ABDOMEN AND PELVIS Lower chest: The lung bases are grossly clear. No worrisome pulmonary lesions, pleural or pericardial effusion. Hepatobiliary: No hepatic lesions are identified. No intra or extrahepatic biliary dilatation. The gallbladder is normal. Pancreas:  No mass, inflammation or ductal dilatation. Spleen:  Normal size.  No focal lesions. Adrenals/Urinary Tract: The adrenal glands and kidneys are unremarkable. No hydronephrosis or renal lesions. The bladder is mildly distended but no bladder lesions or asymmetric bladder wall thickening. Stomach/Bowel: The stomach, duodenum, small bowel and colon are grossly normal. No findings for acute inflammatory process, mass lesions or obstructive findings. The terminal ileum is grossly normal without IV contrast. The  appendix is normal. Vascular/Lymphatic: No pathologically enlarged lymph nodes identified. No abdominal aortic aneurysm demonstrated. Reproductive: The uterus and ovaries are normal. Multiple nabothian cysts are noted at the cervix. Other:  Small amount of free abdominal/pelvic fluid. Musculoskeletal: No significant bony findings. IMPRESSION: 1. No acute abdominal/pelvic findings, mass lesions or lymphadenopathy. 2. No MR findings without contrast to suggest acute/active Crohn's disease. The appendix and ovaries are unremarkable. 3. Small amount of free abdominal/pelvic fluid which may be physiologic. Electronically Signed   By: Marijo Sanes M.D.   On: 07/27/2018 07:29   US Ob Comp < 14 Wks  Result Date: 07/26/2018 CLINICAL DATA:  Lower abdominal pain. Vomiting. Beta HCG 123. Gestational age by last menstrual. Three weeks and 4 days. EXAM: OBSTETRIC <14 WK [place] AND TRANSVAGINAL OB US TECHNIQUE: Both transabdominal and transvaginal ultrasound examinations were performed for complete evaluation of the gestation as well as the maternal uterus, adnexal regions, and pelvic cul-de-sac. Transvaginal technique was performed to assess early pregnancy. COMPARISON:  None. FINDINGS: Intrauterine gestational sac: Not present Yolk sac:  Not present with marginal vascularity. Embryo:  Not present Cardiac Activity: Not present Subchorionic hemorrhage:  None visualized. Maternal uterus/adnexae: Multiple nabothian cysts at the level of the cervix. Small volume free fluid in the pelvis. RIGHT intra-ovarian isoechoic 16 mm corpus luteal cyst. LEFT ovary not sonographically identified. IMPRESSION: 1. Pregnancy of unknown anatomic location (no intrauterine gestational sac or adnexal mass identified). Differential diagnosis includes recent spontaneous miscarriage, IUP too early to visualize, and non-visualized ectopic pregnancy. Recommend correlation with serial beta-hCG levels, and follow up US if warranted clinically. Please note,  with  beta HCG of less than 3,000, pregnancy may not be sonographically evident. Electronically Signed   By: Elon Alas M.D.   On: 07/26/2018 18:53   US Ob Transvaginal  Result Date: 07/26/2018 CLINICAL DATA:  Lower abdominal pain. Vomiting. Beta HCG 123. Gestational age by last menstrual. Three weeks and 4 days. EXAM: OBSTETRIC <14 WK [place] AND TRANSVAGINAL OB US TECHNIQUE: Both transabdominal and transvaginal ultrasound examinations were performed for complete evaluation of the gestation as well as the maternal uterus, adnexal regions, and pelvic cul-de-sac. Transvaginal technique was performed to assess early pregnancy. COMPARISON:  None. FINDINGS: Intrauterine gestational sac: Not present Yolk sac:  Not present with marginal vascularity. Embryo:  Not present Cardiac Activity: Not present Subchorionic hemorrhage:  None visualized. Maternal uterus/adnexae: Multiple nabothian cysts at the level of the cervix. Small volume free fluid in the pelvis. RIGHT intra-ovarian isoechoic 16 mm corpus luteal cyst. LEFT ovary not sonographically identified. IMPRESSION: 1. Pregnancy of unknown anatomic location (no intrauterine gestational sac or adnexal mass identified). Differential diagnosis includes recent spontaneous miscarriage, IUP too early to visualize, and non-visualized ectopic pregnancy. Recommend correlation with serial beta-hCG levels, and follow up US if warranted clinically. Please note, with beta HCG of less than 3,000, pregnancy may not be sonographically evident. Electronically Signed   By: Elon Alas M.D.   On: 07/26/2018 18:53     PERTINENT LAB RESULTS: CBC: Recent Labs    07/26/18 1307 07/27/18 0857  WBC 22.7* 18.0*  HGB 14.3 13.1  HCT 41.9 40.3  PLT 425* 393   CMET CMP     Component Value Date/Time   NA 140 07/26/2018 1307   K 3.7 07/26/2018 1307   CL 105 07/26/2018 1307   CO2 24 07/26/2018 1307   GLUCOSE 135 (H) 07/26/2018 1307   BUN 12 07/26/2018 1307   CREATININE  0.77 07/26/2018 1307   CALCIUM 9.2 07/26/2018 1307   PROT 8.3 (H) 07/26/2018 1307   ALBUMIN 4.2 07/26/2018 1307   AST 19 07/26/2018 1307   ALT 14 07/26/2018 1307   ALKPHOS 60 07/26/2018 1307   BILITOT 0.3 07/26/2018 1307   GFRNONAA >60 07/26/2018 1307   GFRAA >60 07/26/2018 1307    GFR Estimated Creatinine Clearance: 99.5 mL/min (by C-G formula based on SCr of 0.77 mg/dL). Recent Labs    07/26/18 1307  LIPASE 27   No results for input(s): CKTOTAL, CKMB, CKMBINDEX, TROPONINI in the last 72 hours. Invalid input(s): POCBNP No results for input(s): DDIMER in the last 72 hours. No results for input(s): HGBA1C in the last 72 hours. No results for input(s): CHOL, HDL, LDLCALC, TRIG, CHOLHDL, LDLDIRECT in the last 72 hours. No results for input(s): TSH, T4TOTAL, T3FREE, THYROIDAB in the last 72 hours.  Invalid input(s): FREET3 No results for input(s): VITAMINB12, FOLATE, FERRITIN, TIBC, IRON, RETICCTPCT in the last 72 hours. Coags: No results for input(s): INR in the last 72 hours.  Invalid input(s): PT Microbiology: Recent Results (from the past 240 hour(s))  Urine culture     Status: Abnormal   Collection Time: 07/26/18  1:11 PM  Result Value Ref Range Status   Specimen Description   Final    URINE, RANDOM Performed at [facility], [place] [address], [place], [place] [address]    Special Requests   Final    NONE Performed at [facility], [place] [address] [place], [place] [address]    Culture MULTIPLE SPECIES PRESENT, SUGGEST RECOLLECTION (A)  Final   Report Status 07/27/2018  FINAL  Final  Wet prep, genital     Status: Abnormal   Collection Time: 07/26/18  1:50 PM  Result Value Ref Range Status   Yeast Wet Prep HPF POC NONE SEEN NONE SEEN Final   Trich, Wet Prep PRESENT (A) NONE SEEN Final   Clue Cells Wet Prep HPF POC PRESENT (A) NONE SEEN Final   WBC, Wet Prep HPF POC MANY (A) NONE SEEN Final   Sperm NONE SEEN  Final    Comment:  Performed at [facility], [place] [address], [place], [place] [address]  Group A Strep by PCR     Status: None   Collection Time: 07/26/18  7:54 PM  Result Value Ref Range Status   Group A Strep by PCR NOT DETECTED NOT DETECTED Final    Comment: Performed at [facility], [place] [address], [place], [place] [address]    FURTHER DISCHARGE INSTRUCTIONS:  Get Medicines reviewed and adjusted: Please take all your medications with you for your next visit with your Primary MD  Laboratory/radiological data: Please request your Primary MD to go over all hospital tests and procedure/radiological results at the follow up, please ask your Primary MD to get all Hospital records sent to his/her office.  In some cases, they will be blood work, cultures and biopsy results pending at the time of your discharge. Please request that your primary care M.D. goes through all the records of your hospital data and follows up on these results.  Also Note the following: If you experience worsening of your admission symptoms, develop shortness of breath, life threatening emergency, suicidal or homicidal thoughts you must seek medical attention immediately by calling 911 or calling your MD immediately  if symptoms less severe.  You must read complete instructions/literature along with all the possible adverse reactions/side effects for all the Medicines you take and that have been prescribed to you. Take any new Medicines after you have completely understood and accpet all the possible adverse reactions/side effects.   Do not drive when taking Pain medications or sleeping medications (Benzodaizepines)  Do not take more than prescribed Pain, Sleep and Anxiety Medications. It is not advisable to combine anxiety,sleep and pain medications without talking with your primary care practitioner  Special Instructions: If you have smoked or chewed Tobacco  in the last 2 yrs please stop  smoking, stop any regular Alcohol  and or any Recreational drug use.  Wear Seat belts while driving.  Please note: You were cared for by a hospitalist during your hospital stay. Once you are discharged, your primary care physician will handle any further medical issues. Please note that NO REFILLS for any discharge medications will be authorized once you are discharged, as it is imperative that you return to your primary care physician (or establish a relationship with a primary care physician if you do not have one) for your post hospital discharge needs so that they can reassess your need for medications and monitor your lab values.  Total Time spent coordinating discharge including counseling, education and face to face time equals 45 minutes.  SignedOren Binet 07/27/2018 6:11 PM

## 2018-07-27 NOTE — Plan of Care (Signed)
  Problem: Education: Goal: Knowledge of General Education information will improve Description Including pain rating scale, medication(s)/side effects and non-pharmacologic comfort measures Outcome: Progressing   

## 2018-07-27 NOTE — Progress Notes (Signed)
Pt discharged home, left ambulatory with mother.

## 2018-07-27 NOTE — Progress Notes (Signed)
0015 pt received from [facility] ED via [place]. Pt A&O x4, pain 4/10, nausea minimal. Will continue to monitor

## 2018-07-27 NOTE — Progress Notes (Signed)
On reevaluation-patient feels much better, easily tolerating liquids-awaiting a soft diet-if she tolerates that-she is anxious to go home.  She will follow-up at the [facility] clinic tomorrow.  Long discussion with the patient and her mother at bedside (after patient gives me permission).  See discharge summary for details.

## 2018-07-27 NOTE — Plan of Care (Addendum)
Pt progressing in care, she is up ambulating in room, and tolerating PO intake at this time

## 2018-07-27 NOTE — Progress Notes (Signed)
Pt tolerated PO meals well. No c/o N/V , per discharge order, if tolerated well, Pt able to go home.  Pt has f/u appt on 07/28/2018 -  Pt given discharge instructions.

## 2018-07-28 ENCOUNTER — Other Ambulatory Visit: Payer: Self-pay

## 2018-07-28 ENCOUNTER — Encounter: Payer: Self-pay | Admitting: Obstetrics and Gynecology

## 2018-07-28 DIAGNOSIS — O039 Complete or unspecified spontaneous abortion without complication: Secondary | ICD-10-CM

## 2018-07-29 LAB — BETA HCG QUANT (REF LAB): hCG Quant: 267 m[IU]/mL

## 2018-08-04 ENCOUNTER — Telehealth: Payer: Self-pay | Admitting: General Practice

## 2018-08-04 DIAGNOSIS — O3680X0 Pregnancy with inconclusive fetal viability, not applicable or unspecified: Secondary | ICD-10-CM

## 2018-08-04 NOTE — Telephone Encounter (Signed)
Patient called the office and left a message on the nurse voicemail line stating she would like the results from her hormone check last week. Per chart review, patient should have had a stat bhcg last week. Reviewed with Dr Rip Harbour who states bhcg levels are increasing appropriately but early pregnant. Patient should have follow up bhcg this week- does not need to be stat. Called patient & informed her of results & discussed follow up. Patient verbalized understanding and states she can come 9/6 @ 130. Patient had no questions.

## 2018-08-06 ENCOUNTER — Other Ambulatory Visit: Payer: Self-pay

## 2018-08-06 DIAGNOSIS — O3680X0 Pregnancy with inconclusive fetal viability, not applicable or unspecified: Secondary | ICD-10-CM

## 2018-08-07 LAB — BETA HCG QUANT (REF LAB): hCG Quant: 7854 m[IU]/mL

## 2018-08-09 ENCOUNTER — Inpatient Hospital Stay (HOSPITAL_COMMUNITY): Payer: Medicaid Other

## 2018-08-09 ENCOUNTER — Encounter (HOSPITAL_COMMUNITY): Payer: Self-pay | Admitting: *Deleted

## 2018-08-09 ENCOUNTER — Inpatient Hospital Stay (HOSPITAL_COMMUNITY)
Admission: AD | Admit: 2018-08-09 | Discharge: 2018-08-09 | Disposition: A | Discharge status: Home / Self Care | Payer: Medicaid Other | Source: Ambulatory Visit | Attending: Obstetrics & Gynecology | Admitting: Obstetrics & Gynecology

## 2018-08-09 DIAGNOSIS — O26891 Other specified pregnancy related conditions, first trimester: Secondary | ICD-10-CM | POA: Insufficient documentation

## 2018-08-09 DIAGNOSIS — K5090 Crohn's disease, unspecified, without complications: Secondary | ICD-10-CM | POA: Insufficient documentation

## 2018-08-09 DIAGNOSIS — Z79899 Other long term (current) drug therapy: Secondary | ICD-10-CM | POA: Insufficient documentation

## 2018-08-09 DIAGNOSIS — O209 Hemorrhage in early pregnancy, unspecified: Secondary | ICD-10-CM

## 2018-08-09 DIAGNOSIS — O99611 Diseases of the digestive system complicating pregnancy, first trimester: Secondary | ICD-10-CM | POA: Insufficient documentation

## 2018-08-09 DIAGNOSIS — Z3A01 Less than 8 weeks gestation of pregnancy: Secondary | ICD-10-CM | POA: Insufficient documentation

## 2018-08-09 DIAGNOSIS — A749 Chlamydial infection, unspecified: Secondary | ICD-10-CM | POA: Diagnosis not present

## 2018-08-09 DIAGNOSIS — R109 Unspecified abdominal pain: Secondary | ICD-10-CM | POA: Insufficient documentation

## 2018-08-09 DIAGNOSIS — Z3491 Encounter for supervision of normal pregnancy, unspecified, first trimester: Secondary | ICD-10-CM

## 2018-08-09 DIAGNOSIS — Z881 Allergy status to other antibiotic agents status: Secondary | ICD-10-CM | POA: Insufficient documentation

## 2018-08-09 DIAGNOSIS — O98811 Other maternal infectious and parasitic diseases complicating pregnancy, first trimester: Secondary | ICD-10-CM | POA: Insufficient documentation

## 2018-08-09 DIAGNOSIS — O4691 Antepartum hemorrhage, unspecified, first trimester: Secondary | ICD-10-CM | POA: Diagnosis not present

## 2018-08-09 DIAGNOSIS — O26899 Other specified pregnancy related conditions, unspecified trimester: Secondary | ICD-10-CM

## 2018-08-09 LAB — URINALYSIS, ROUTINE W REFLEX MICROSCOPIC
BILIRUBIN URINE: NEGATIVE
Glucose, UA: NEGATIVE mg/dL
KETONES UR: 20 mg/dL — AB
LEUKOCYTES UA: NEGATIVE
NITRITE: NEGATIVE
Protein, ur: NEGATIVE mg/dL
SPECIFIC GRAVITY, URINE: 1.013 (ref 1.005–1.030)
pH: 7 (ref 5.0–8.0)

## 2018-08-09 LAB — CBC
HEMATOCRIT: 37.7 % (ref 36.0–46.0)
Hemoglobin: 12.7 g/dL (ref 12.0–15.0)
MCH: 26.7 pg (ref 26.0–34.0)
MCHC: 33.7 g/dL (ref 30.0–36.0)
MCV: 79.4 fL (ref 78.0–100.0)
PLATELETS: 321 10*3/uL (ref 150–400)
RBC: 4.75 MIL/uL (ref 3.87–5.11)
RDW: 15.8 % — ABNORMAL HIGH (ref 11.5–15.5)
WBC: 12.7 10*3/uL — AB (ref 4.0–10.5)

## 2018-08-09 LAB — HCG, QUANTITATIVE, PREGNANCY: hCG, Beta Chain, Quant, S: 21954 m[IU]/mL — ABNORMAL HIGH (ref <5)

## 2018-08-09 MED ORDER — AZITHROMYCIN 500 MG PO TABS: 1000.0000 mg | ORAL_TABLET | Freq: Once | ORAL | 0 refills | Status: AC

## 2018-08-09 MED ORDER — AZITHROMYCIN 250 MG PO TABS
1000.0000 mg | ORAL_TABLET | Freq: Once | ORAL | Status: AC
  Administered 2018-08-09: 1000 mg via ORAL
  Filled 2018-08-09: qty 4

## 2018-08-09 NOTE — Discharge Instructions (Signed)
[place] for [place] at [facility]       Phone: [phone]  [facility] [place] at [place] Phone: [place] for [employer] at [place]  Phone: [place] for [place] at [facility]  Phone: [place] for [place] at [facility]  Phone: [facility]       Phone: [phone]  [facility]    Phone: [phone]   [facility] [place])    Phone: [facility]    Phone: [phone]  [facility]    Phone: [place] [facility]  Phone: [phone]  [place]    Phone: [phone]  [facility]   Phone: [phone]  [facility]    Phone: [phone]     First Trimester of Pregnancy The first trimester of pregnancy is from week 1 until the end of week 13 (months 1 through 3). During this time, your baby will begin to develop inside you. At 6-8 weeks, the eyes and face are formed, and the heartbeat can be seen on ultrasound. At the end of 12 weeks, all the baby's organs are formed. Prenatal care is all the medical care you receive before the birth of your baby. Make sure you get good prenatal care and follow all of your doctor's instructions. Follow these instructions at home: Medicines  Take over-the-counter and prescription medicines only as told by your doctor. Some medicines are safe and some medicines are not safe during pregnancy.  Take a prenatal vitamin that contains at least 600 micrograms (mcg) of folic acid.  If you have trouble pooping (constipation), take medicine that will make your stool soft (stool softener) if your doctor approves. Eating and drinking  Eat regular, healthy meals.  Your doctor will tell you  the amount of weight gain that is right for you.  Avoid raw meat and uncooked cheese.  If you feel sick to your stomach (nauseous) or throw up (vomit): ? Eat 4 or 5 small meals a day instead of 3 large meals. ? Try eating a few soda crackers. ? Drink liquids between meals instead of during meals.  To prevent constipation: ? Eat foods that are high in fiber, like fresh fruits and vegetables, whole grains, and beans. ? Drink enough fluids to keep your pee (urine) clear or pale yellow. Activity  Exercise only as told by your doctor. Stop exercising if you have cramps or pain in your lower belly (abdomen) or low back.  Do not exercise if it is too hot, too humid, or if you are in a place of great height (high altitude).  Try to avoid standing for long periods of time. Move your legs often if you must stand in one place for a long time.  Avoid heavy lifting.  Wear low-heeled shoes. Sit and stand up straight.  You can have sex unless your doctor tells you not to. Relieving pain and discomfort  Wear a good support bra if your breasts are sore.  Take warm water baths (sitz baths) to soothe pain or discomfort caused by hemorrhoids. Use hemorrhoid cream if your doctor says it is okay.  Rest with your legs raised if you have leg cramps or low back pain.  If you have puffy, bulging veins (varicose veins) in your legs: ? Wear support hose or compression stockings as  told by your doctor. ? Raise (elevate) your feet for 15 minutes, 3-4 times a day. ? Limit salt in your food. Prenatal care  Schedule your prenatal visits by the twelfth week of pregnancy.  Write down your questions. Take them to your prenatal visits.  Keep all your prenatal visits as told by your doctor. This is important. Safety  Wear your seat belt at all times when driving.  Make a list of emergency phone numbers. The list should include numbers for family, friends, the hospital, and police and fire  departments. General instructions  Ask your doctor for a referral to a local prenatal class. Begin classes no later than at the start of month 6 of your pregnancy.  Ask for help if you need counseling or if you need help with nutrition. Your doctor can give you advice or tell you where to go for help.  Do not use hot tubs, steam rooms, or saunas.  Do not douche or use tampons or scented sanitary pads.  Do not cross your legs for long periods of time.  Avoid all herbs and alcohol. Avoid drugs that are not approved by your doctor.  Do not use any tobacco products, including cigarettes, chewing tobacco, and electronic cigarettes. If you need help quitting, ask your doctor. You may get counseling or other support to help you quit.  Avoid cat litter boxes and soil used by cats. These carry germs that can cause birth defects in the baby and can cause a loss of your baby (miscarriage) or stillbirth.  Visit your dentist. At home, brush your teeth with a soft toothbrush. Be gentle when you floss. Contact a doctor if:  You are dizzy.  You have mild cramps or pressure in your lower belly.  You have a nagging pain in your belly area.  You continue to feel sick to your stomach, you throw up, or you have watery poop (diarrhea).  You have a bad smelling fluid coming from your vagina.  You have pain when you pee (urinate).  You have increased puffiness (swelling) in your face, hands, legs, or ankles. Get help right away if:  You have a fever.  You are leaking fluid from your vagina.  You have spotting or bleeding from your vagina.  You have very bad belly cramping or pain.  You gain or lose weight rapidly.  You throw up blood. It may look like coffee grounds.  You are around people who have [place] measles, fifth disease, or chickenpox.  You have a very bad headache.  You have shortness of breath.  You have any kind of trauma, such as from a fall or a car  accident. Summary  The first trimester of pregnancy is from week 1 until the end of week 13 (months 1 through 3).  To take care of yourself and your unborn baby, you will need to eat healthy meals, take medicines only if your doctor tells you to do so, and do activities that are safe for you and your baby.  Keep all follow-up visits as told by your doctor. This is important as your doctor will have to ensure that your baby is healthy and growing well. This information is not intended to replace advice given to you by your health care provider. Make sure you discuss any questions you have with your health care provider. Document Released: 05/05/2008 Document Revised: 11/25/2016 Document Reviewed: 11/25/2016 Elsevier Interactive Patient Education  2017 [employer].

## 2018-08-09 NOTE — MAU Note (Signed)
Pt reports spotting and cramping since yesterday.

## 2018-08-09 NOTE — MAU Provider Note (Signed)
Chief Complaint: Vaginal Bleeding and Abdominal Pain   First Provider Initiated Contact with Patient 08/09/18 1440     SUBJECTIVE HPI: Frances Mccormick is a 32 y.o. G1P0 at [redacted]w[redacted]d who presents to Maternity Admissions reporting abdominal cramping and vaginal bleeding. Has noticed spotting on toilet paper since yesterday. Was initially bright red but now brown. Was admitted to hospital last month for crohn's flare. Was diagnosed & treated for trich at that time. Culture came back positive for chlamydia but doesn't recall being told that or being treated for it. Has not had intercourse since prior to that hospital visit.   Location: lower abdomen Quality: cramping Severity: 3/10 on pain scale Duration: 2 days Timing: intermittent Modifying factors: none Associated signs and symptoms: vaginal bleeding  Past Medical History:  Diagnosis Date  . Crohn's disease ([place])   . Migraine    "a few/year" (07/27/2018)  . Non-compliant patient    OB History  Gravida Para Term Preterm AB Living  1            SAB TAB Ectopic Multiple Live Births               # Outcome Date GA Lbr Len/2nd Weight Sex Delivery Anes PTL Lv  1 Current            Past Surgical History:  Procedure Laterality Date  . INDUCED ABORTION     Social History   Socioeconomic History  . Marital status: Single    Spouse name: Not on file  . Number of children: Not on file  . Years of education: Not on file  . Highest education level: Not on file  Occupational History  . Not on file  Social Needs  . Financial resource strain: Not on file  . Food insecurity:    Worry: Not on file    Inability: Not on file  . Transportation needs:    Medical: Not on file    Non-medical: Not on file  Tobacco Use  . Smoking status: Never Smoker  . Smokeless tobacco: Never Used  Substance and Sexual Activity  . Alcohol use: Yes    Alcohol/week: 2.0 standard drinks    Types: 1 Glasses of wine, 1 Cans of beer per week  . Drug use: No   . Sexual activity: Yes    Birth control/protection: Condom  Lifestyle  . Physical activity:    Days per week: Not on file    Minutes per session: Not on file  . Stress: Not on file  Relationships  . Social connections:    Talks on phone: Not on file    Gets together: Not on file    Attends religious service: Not on file    Active member of club or organization: Not on file    Attends meetings of clubs or organizations: Not on file    Relationship status: Not on file  . Intimate partner violence:    Fear of current or ex partner: Not on file    Emotionally abused: Not on file    Physically abused: Not on file    Forced sexual activity: Not on file  Other Topics Concern  . Not on file  Social History Narrative  . Not on file   Family History  Problem Relation Age of Onset  . Diabetes Mother   . Diabetes Father    No current facility-administered medications on file prior to encounter.    Current Outpatient Medications on File Prior to Encounter  Medication Sig Dispense Refill  . acetaminophen (TYLENOL) 325 MG tablet Take 325 mg by mouth every 6 (six) hours as needed for mild pain.    . prenatal vitamin w/FE, FA (NATACHEW) 29-1 MG CHEW chewable tablet Chew 1 tablet by mouth daily at 12 noon. 30 tablet 0  . promethazine (PHENERGAN) 12.5 MG tablet Take 1 tablet (12.5 mg total) by mouth every 8 (eight) hours as needed for nausea or vomiting. 30 tablet 0   Allergies  Allergen Reactions  . Bee Venom Anaphylaxis  . Ciprofloxacin Anaphylaxis    I have reviewed patient's Past Medical Hx, Surgical Hx, Family Hx, Social Hx, medications and allergies.   Review of Systems  Constitutional: Negative.   Gastrointestinal: Positive for abdominal pain. Negative for diarrhea, nausea and vomiting.  Genitourinary: Positive for vaginal bleeding. Negative for dysuria and vaginal discharge.    OBJECTIVE Patient Vitals for the past 24 hrs:  BP Temp Temp src Pulse Resp SpO2 Height Weight   08/09/18 1406 114/66 98.6 F (37 C) Oral 68 16 100 % 5' (1.524 m) 89.4 kg   Constitutional: Well-developed, well-nourished female in no acute distress.  Cardiovascular: normal rate & rhythm, no murmur Respiratory: normal rate and effort. Lung sounds clear throughout GI: Abd soft, non-tender, Pos BS x 4. No guarding or rebound tenderness MS: Extremities nontender, no edema, normal ROM Neurologic: Alert and oriented x 4.  GU:     SPECULUM EXAM: NEFG, physiologic discharge, small amount of old brown  blood, no active bleeding, cervix clean     LAB RESULTS Results for orders placed or performed during the hospital encounter of 08/09/18 (from the past 24 hour(s))  Urinalysis, Routine w reflex microscopic     Status: Abnormal   Collection Time: 08/09/18  2:54 PM  Result Value Ref Range   Color, Urine YELLOW YELLOW   APPearance CLEAR CLEAR   Specific Gravity, Urine 1.013 1.005 - 1.030   pH 7.0 5.0 - 8.0   Glucose, UA NEGATIVE NEGATIVE mg/dL   Hgb urine dipstick SMALL (A) NEGATIVE   Bilirubin Urine NEGATIVE NEGATIVE   Ketones, ur 20 (A) NEGATIVE mg/dL   Protein, ur NEGATIVE NEGATIVE mg/dL   Nitrite NEGATIVE NEGATIVE   Leukocytes, UA NEGATIVE NEGATIVE   RBC / HPF 0-5 0 - 5 RBC/hpf   WBC, UA 0-5 0 - 5 WBC/hpf   Bacteria, UA RARE (A) NONE SEEN   Squamous Epithelial / LPF 0-5 0 - 5   Mucus PRESENT   CBC     Status: Abnormal   Collection Time: 08/09/18  3:15 PM  Result Value Ref Range   WBC 12.7 (H) 4.0 - 10.5 K/uL   RBC 4.75 3.87 - 5.11 MIL/uL   Hemoglobin 12.7 12.0 - 15.0 g/dL   HCT 37.7 36.0 - 46.0 %   MCV 79.4 78.0 - 100.0 fL   MCH 26.7 26.0 - 34.0 pg   MCHC 33.7 30.0 - 36.0 g/dL   RDW 15.8 (H) 11.5 - 15.5 %   Platelets 321 150 - 400 K/uL  hCG, quantitative, pregnancy     Status: Abnormal   Collection Time: 08/09/18  3:15 PM  Result Value Ref Range   hCG, Beta Chain, Quant, S 21,954 (H) <5 mIU/mL    IMAGING US Ob Transvaginal  Result Date: 08/09/2018 CLINICAL DATA:   Pregnant, abdominal cramping EXAM: TRANSVAGINAL OB ULTRASOUND TECHNIQUE: Transvaginal ultrasound was performed for complete evaluation of the gestation as well as the maternal uterus, adnexal regions, and pelvic cul-de-sac. COMPARISON:  None. FINDINGS: Intrauterine gestational sac: Single Yolk sac:  Visualized. Embryo:  Not Visualized. MSD: 12.6 mm   6 w   0 d Subchorionic hemorrhage:  None visualized. Maternal uterus/adnexae: Bilateral ovaries are not discretely visualized. No free fluid. IMPRESSION: Single intrauterine gestational sac with yolk sac, measuring 6 weeks 0 days by mean sac diameter. A fetal pole is not yet visualized. Consider follow-up pelvic ultrasound in 14 days to confirm viability, as clinically warranted. Electronically Signed   By: Julian Hy M.D.   On: 08/09/2018 16:12    MAU COURSE Orders Placed This Encounter  Procedures  . US OB Transvaginal  . Urinalysis, Routine w reflex microscopic  . CBC  . hCG, quantitative, pregnancy  . Discharge patient   Meds ordered this encounter  Medications  . azithromycin (ZITHROMAX) tablet 1,000 mg  . azithromycin (ZITHROMAX) 500 MG tablet    Sig: Take 2 tablets (1,000 mg total) by mouth once for 1 dose.    Dispense:  2 tablet    Refill:  0    Order Specific Question:   Supervising Provider    Answer:   Verita Schneiders A [[id number]]    MDM B positive Ultrasound shows IUGS w/yolk sac No active bleeding.  Pt tx for chlamydia while here  ASSESSMENT 1. Abdominal cramping affecting pregnancy   2. Normal IUP (intrauterine pregnancy) on prenatal ultrasound, first trimester   3. Chlamydia infection affecting pregnancy in first trimester   4. Vaginal bleeding in pregnancy, first trimester     PLAN Discharge home in stable condition. Bleeding precautions  Allergies as of 08/09/2018      Reactions   Bee Venom Anaphylaxis   Ciprofloxacin Anaphylaxis      Medication List    STOP taking these medications   cephALEXin 500 MG  capsule Commonly known as:  KEFLEX   metroNIDAZOLE 500 MG tablet Commonly known as:  FLAGYL   pyridOXINE 25 MG tablet Commonly known as:  VITAMIN B-6     TAKE these medications   acetaminophen 325 MG tablet Commonly known as:  TYLENOL Take 325 mg by mouth every 6 (six) hours as needed for mild pain.   azithromycin 500 MG tablet Commonly known as:  ZITHROMAX Take 2 tablets (1,000 mg total) by mouth once for 1 dose.   prenatal vitamin w/FE, FA 29-1 MG Chew chewable tablet Chew 1 tablet by mouth daily at 12 noon.   promethazine 12.5 MG tablet Commonly known as:  PHENERGAN Take 1 tablet (12.5 mg total) by mouth every 8 (eight) hours as needed for nausea or vomiting.        Jorje Guild, NP 08/09/2018  5:27 PM

## 2018-08-11 ENCOUNTER — Inpatient Hospital Stay (HOSPITAL_COMMUNITY)
Admission: AD | Admit: 2018-08-11 | Discharge: 2018-08-11 | Disposition: A | Discharge status: Home / Self Care | Payer: Medicaid Other | Source: Ambulatory Visit | Attending: Family Medicine | Admitting: Family Medicine

## 2018-08-11 ENCOUNTER — Encounter (HOSPITAL_COMMUNITY): Payer: Self-pay | Admitting: *Deleted

## 2018-08-11 DIAGNOSIS — O26891 Other specified pregnancy related conditions, first trimester: Secondary | ICD-10-CM | POA: Insufficient documentation

## 2018-08-11 DIAGNOSIS — Z881 Allergy status to other antibiotic agents status: Secondary | ICD-10-CM | POA: Insufficient documentation

## 2018-08-11 DIAGNOSIS — Z3A01 Less than 8 weeks gestation of pregnancy: Secondary | ICD-10-CM | POA: Diagnosis not present

## 2018-08-11 DIAGNOSIS — O98811 Other maternal infectious and parasitic diseases complicating pregnancy, first trimester: Secondary | ICD-10-CM | POA: Diagnosis not present

## 2018-08-11 DIAGNOSIS — A749 Chlamydial infection, unspecified: Secondary | ICD-10-CM | POA: Insufficient documentation

## 2018-08-11 DIAGNOSIS — O219 Vomiting of pregnancy, unspecified: Secondary | ICD-10-CM

## 2018-08-11 DIAGNOSIS — Z91030 Bee allergy status: Secondary | ICD-10-CM | POA: Diagnosis not present

## 2018-08-11 DIAGNOSIS — O119 Pre-existing hypertension with pre-eclampsia, unspecified trimester: Secondary | ICD-10-CM

## 2018-08-11 DIAGNOSIS — R42 Dizziness and giddiness: Secondary | ICD-10-CM | POA: Diagnosis not present

## 2018-08-11 DIAGNOSIS — R112 Nausea with vomiting, unspecified: Secondary | ICD-10-CM | POA: Diagnosis not present

## 2018-08-11 DIAGNOSIS — O10919 Unspecified pre-existing hypertension complicating pregnancy, unspecified trimester: Secondary | ICD-10-CM

## 2018-08-11 DIAGNOSIS — O161 Unspecified maternal hypertension, first trimester: Secondary | ICD-10-CM | POA: Insufficient documentation

## 2018-08-11 HISTORY — DX: Pre-existing hypertension with pre-eclampsia, unspecified trimester: O11.9

## 2018-08-11 LAB — RAPID URINE DRUG SCREEN, HOSP PERFORMED
AMPHETAMINES: NOT DETECTED
Barbiturates: NOT DETECTED
Benzodiazepines: NOT DETECTED
Cocaine: NOT DETECTED
OPIATES: NOT DETECTED
Tetrahydrocannabinol: POSITIVE — AB

## 2018-08-11 LAB — URINALYSIS, ROUTINE W REFLEX MICROSCOPIC
Bilirubin Urine: NEGATIVE
GLUCOSE, UA: NEGATIVE mg/dL
Ketones, ur: 40 mg/dL — AB
Leukocytes, UA: NEGATIVE
Nitrite: NEGATIVE
PH: 7 (ref 5.0–8.0)
Protein, ur: NEGATIVE mg/dL
SPECIFIC GRAVITY, URINE: 1.01 (ref 1.005–1.030)

## 2018-08-11 LAB — URINALYSIS, MICROSCOPIC (REFLEX)

## 2018-08-11 MED ORDER — PROMETHAZINE HCL 25 MG/ML IJ SOLN
12.5000 mg | Freq: Once | INTRAMUSCULAR | Status: AC
  Administered 2018-08-11: 12.5 mg via INTRAVENOUS
  Filled 2018-08-11: qty 1

## 2018-08-11 MED ORDER — LABETALOL HCL 200 MG PO TABS: 200.0000 mg | ORAL_TABLET | Freq: Two times a day (BID) | ORAL | 3 refills | Status: DC

## 2018-08-11 MED ORDER — ONDANSETRON HCL 4 MG/2ML IJ SOLN
4.0000 mg | Freq: Once | INTRAMUSCULAR | Status: AC
  Administered 2018-08-11: 4 mg via INTRAVENOUS
  Filled 2018-08-11: qty 2

## 2018-08-11 MED ORDER — METOCLOPRAMIDE HCL 10 MG PO TABS: 10.0000 mg | ORAL_TABLET | Freq: Three times a day (TID) | ORAL | 0 refills | Status: DC

## 2018-08-11 MED ORDER — LOPERAMIDE HCL 2 MG PO CAPS
4.0000 mg | ORAL_CAPSULE | Freq: Once | ORAL | Status: DC
  Filled 2018-08-11: qty 2

## 2018-08-11 MED ORDER — ACETAMINOPHEN 500 MG PO TABS
1000.0000 mg | ORAL_TABLET | Freq: Once | ORAL | Status: AC
  Administered 2018-08-11: 1000 mg via ORAL
  Filled 2018-08-11: qty 2

## 2018-08-11 MED ORDER — M.V.I. ADULT IV INJ
Freq: Once | INTRAVENOUS | Status: AC
  Administered 2018-08-11: 11:00:00 via INTRAVENOUS
  Filled 2018-08-11: qty 1000

## 2018-08-11 MED ORDER — PROMETHAZINE HCL 25 MG PO TABS: 12.5000 mg | ORAL_TABLET | Freq: Four times a day (QID) | ORAL | 0 refills | Status: DC | PRN

## 2018-08-11 MED ORDER — PROMETHAZINE HCL 25 MG PO TABS: 12.5000 mg | ORAL_TABLET | Freq: Every evening | ORAL | 0 refills | Status: DC | PRN

## 2018-08-11 MED ORDER — LACTATED RINGERS IV BOLUS
1000.0000 mL | Freq: Once | INTRAVENOUS | Status: AC
  Administered 2018-08-11: 1000 mL via INTRAVENOUS
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
## 2018-08-11 NOTE — MAU Note (Signed)
Pt arrived EMS. C/o non stop vomiting since 11pm. Was seen in MAU on Monday and given medication for STD.zithromax.

## 2018-08-11 NOTE — Discharge Instructions (Signed)
[place] for [employer] at [facility]       Phone: [phone]  [facility] for [employer] at [employer] Phone: [place] for [employer] at [place]  Phone: [place] for [employer] at [employer]  Phone: [place] for [place] at [place]  Phone: [facility]       Phone: [phone]  [facility]    Phone: [phone]   [facility] [place])    Phone: [facility]    Phone: [phone]  [facility]    Phone: [phone]  [place]    Phone: [phone]  [place] Department-Family Planning       Phone: [phone]   [place] Department-Maternity  Phone: [phone]  [place]    Phone: [phone]  [facility] [place]   Phone: [phone]  [facility]      Phone: [phone]  [facility]    Phone: [phone]  [place] (Revised August 2014)   Chronic Pain Problems:   [facility]:  [phone]           Patients need to be referred by their primary care doctor/specialist  Insufficient Money for Medicine:           [employer]: call "211"    MAP Program at [place] or [place] [phone]            No Primary Care Doctor:  To locate a primary care doctor that accepts your insurance or provides certain services:           [place]: [phone]           Physician Referral Service: [phone] ask for My [place]  If no insurance, you need to see if you qualify for [facility] orange card, call to set      up appointment for eligibility/enrollment at [phone] or [phone] or visit [place] ([address], [place] and [place]) to meet with a [facility] enrollment specialist.  Agencies that provide inexpensive (sliding fee scale) medical care:       [facility] - Family Medicine at [place] - [phone]     [facility] [place] - [facility] [facility] - [place] - [place] for [facility] - [place] [phone]  [facility] - [place] @ [place] [phone]  [facility] - [place] @ [place] - [phone]  [facility]: [phone]   Women's Clinic: [phone]   Planned Parenthood: [phone]   [facility] of the [place]    [place] Providers:           [facility] [phone] (No Family Planning accepted)          [address], [phone], Mon-Fri 9am-5pm          [facility] - [phone]  [place],  [address], Mon-Thursday 8am-5pm, Fri 8am-noon  [place], [place], Mon-Fri 7:30am-4:30pm          [place] - [phone]          [address] [place] [phone] [address], [address]          Only accepts [employer] patients after they have their name applied to their card  Self Pay (no insurance) in [facility]:           Sickle Cell Patients:   [place], [phone] [facility]:  [address] [place] [phone]       [facility] and Wellness  [place] [phone]  [facility]:  [address] [phone]          [facility] Urgent Care           [place], [phone] [facility] for [place], [phone]           [facility] [place]           [place] [address], [place]        [facility] - [address]           [phone], Mon-Fri 9am-7pm, [place] 9am-1pm          [facility] - Family [facility] @ [facility]          [place], [place]          [facility] - [facility]           [address], [place] [facility] and [place]  [address] [place] [phone]          [place], [place]  [facility] - [place]   [place], [phone] [facility] - [facility]  [address] [phone]  Dr. Vista Lawman           [address], [place], [place] [facility]           [address], [phone]          [facility]             [address] [phone]          [facility]           [place], [place], 1st & 3rd Saturday every month, 10am-1pm  OTHERS:  [facility]  ([facility] Only)  [phone] (Thursday only)  Strategies for finding a Primary Care Provider:  1) Find a Doctor and Pay Out of Pocket  Although you won't have to find out who is covered by your insurance plan, it is  a good idea to ask around and get recommendations. You will then need to call the office and see if the doctor you have chosen will accept you as a new patient and what types of options they offer for patients who are self-pay. Some doctors offer discounts or will  set up payment plans for their patients who do not have insurance, but you will need to ask so you aren't surprised when you get to your appointment.  2) [place] - To see if you qualify for orange card access to healthcare safety net providers.  Call for appointment for eligibility/enrollment at [phone] or [phone]. (Uninsured, 0-200% FPL, qualifying info)  Applicants for [facility] are first required to see if they are eligible to enroll in the [facility] Marketplace before enrolling in [facility] (and get an exemption if they are not).  GCCN Criteria for acceptance is:  ? Proof of [employer] exemption - form or documentation  ? Valid photo ID (driver's license, state identification card, passport, home country ID)  ? Proof of [facility] residency (e.g. drivers license, lease/landlord information, pay stubs with address, utility bill, bank statement, etc.)  ? Proof of income (1040, last year's tax return, W2, 4 current pay stubs, other income proof)  ? Proof of assets (current bank statement + 3 most recent, disability paperwork, life insurance info, tax value on autos, etc.)  3) [place] Department  Not all health departments have doctors that can see patients for sick visits, but many do, so it is worth a call to see if yours does. If you don't know where your local health department is, you can check in your phone book. The CDC also has a tool to help you locate your state's health department, and many state websites also have listings of all of their local health departments.  4) Find a [facility]  If your illness is not likely to be very severe or complicated, you may want to try a walk in clinic. These are popping up all over the country in pharmacies, drugstores, and shopping centers. They're usually staffed by nurse practitioners or physician assistants that have been trained to treat common illnesses and complaints. They're usually fairly  quick and inexpensive. However, if you have serious medical issues or chronic medical problems, these are probably not your best option

## 2018-08-11 NOTE — MAU Provider Note (Signed)
None     Chief Complaint:  Emesis   Frances Mccormick is  32 y.o. G2P0010 at [redacted]w[redacted]d presents complaining of Emesis .  Marland Kitchen She was seen in MAU 9/9 and dx w/chlamydia.  GIven a rx for zithromax.  She took the zithromax (1gm), and after about 2 hours, started vomiting and having diarrhea/stomach cramps. Arrived via EMS d/t feeling so poorly and feeling dehydrated.  Received zofran IM in the ambulance, feels "a little better" but has still vomited since being in MAU.  Felt dizzy.  Denies hx of HTN.  Hx of Chron's disease, CT from 8/26 was neg for active disease. Pt feels like it was the zithromax that is making her so sick, not her Chron's  Obstetrical/Gynecological History: OB History    Gravida  2   Para  0   Term      Preterm      AB  1   Living        SAB      TAB      Ectopic      Multiple      Live Births             Past Medical History: Past Medical History:  Diagnosis Date  . Crohn's disease ([place])   . Migraine    "a few/year" (07/27/2018)  . Non-compliant patient     Past Surgical History: Past Surgical History:  Procedure Laterality Date  . INDUCED ABORTION      Family History: Family History  Problem Relation Age of Onset  . Diabetes Mother   . Diabetes Father     Social History: Social History   Tobacco Use  . Smoking status: Never Smoker  . Smokeless tobacco: Never Used  Substance Use Topics  . Alcohol use: Not Currently    Alcohol/week: 2.0 standard drinks    Types: 1 Glasses of wine, 1 Cans of beer per week  . Drug use: No    Allergies:  Allergies  Allergen Reactions  . Bee Venom Anaphylaxis  . Ciprofloxacin Anaphylaxis    Meds:  Medications Prior to Admission  Medication Sig Dispense Refill Last Dose  . acetaminophen (TYLENOL) 325 MG tablet Take 325 mg by mouth every 6 (six) hours as needed for mild pain.   08/07/2018  . prenatal vitamin w/FE, FA (NATACHEW) 29-1 MG CHEW chewable tablet Chew 1 tablet by mouth daily at 12 noon.  30 tablet 0 08/08/2018 at Unknown time  . promethazine (PHENERGAN) 12.5 MG tablet Take 1 tablet (12.5 mg total) by mouth every 8 (eight) hours as needed for nausea or vomiting. 30 tablet 0 unk at prn    Review of Systems   Constitutional: Negative for fever and chills Eyes: Negative for visual disturbances Respiratory: Negative for shortness of breath, dyspnea Cardiovascular: Negative for chest pain or palpitations  Gastrointestinal: Negative for vomiting, diarrhea and constipation Genitourinary: Negative for dysuria and urgency Musculoskeletal: Negative for back pain, joint pain, myalgias.  Normal ROM  Neurological: Negative for dizziness and headaches   Patient Vitals for the past 4 hrs:  BP Temp Pulse Resp  08/11/18 0731 (!) 153/89 - 76 -  08/11/18 0617 (!) 150/110 - 73 -  08/11/18 0611 (!) 167/85 98.4 F (36.9 C) 69 18    Physical Exam  Blood pressure (!) 153/89, pulse 76, temperature 98.4 F (36.9 C), resp. rate 18, last menstrual period 06/27/2018. GENERAL: Well-developed, well-nourished female in no acute distress.  LUNGS: Normal respiratory effort.  HEART:  Regular rate and rhythm. ABDOMEN: Soft, nontender, nondistended EXTREMITIES: Nontender, no edema, 2+ distal pulses. DTR's 2+   Labs: Results for orders placed or performed during the hospital encounter of 08/11/18 (from the past 24 hour(s))  Urinalysis, Routine w reflex microscopic   Collection Time: 08/11/18  6:28 AM  Result Value Ref Range   Color, Urine YELLOW YELLOW   APPearance CLEAR CLEAR   Specific Gravity, Urine 1.010 1.005 - 1.030   pH 7.0 5.0 - 8.0   Glucose, UA NEGATIVE NEGATIVE mg/dL   Hgb urine dipstick TRACE (A) NEGATIVE   Bilirubin Urine NEGATIVE NEGATIVE   Ketones, ur 40 (A) NEGATIVE mg/dL   Protein, ur NEGATIVE NEGATIVE mg/dL   Nitrite NEGATIVE NEGATIVE   Leukocytes, UA NEGATIVE NEGATIVE  Urinalysis, Microscopic (reflex)   Collection Time: 08/11/18  6:28 AM  Result Value Ref Range   RBC /  HPF 0-5 0 - 5 RBC/hpf   WBC, UA 0-5 0 - 5 WBC/hpf   Bacteria, UA RARE (A) NONE SEEN   Squamous Epithelial / LPF 0-5 0 - 5   Imaging Studies:  Mr Pelvis Wo Contrast  Result Date: 07/27/2018 CLINICAL DATA:  History of Crohn's disease with abdominal pain and nausea and vomiting. EXAM: MRI ABDOMEN AND PELVIS WITHOUT CONTRAST TECHNIQUE: Multiplanar multisequence MR imaging of the abdomen and pelvis was performed. No intravenous contrast was administered. COMPARISON:  Ultrasound 07/26/2018 and CT scan 12/09/2016 FINDINGS: COMBINED FINDINGS FOR BOTH MR ABDOMEN AND PELVIS Lower chest: The lung bases are grossly clear. No worrisome pulmonary lesions, pleural or pericardial effusion. Hepatobiliary: No hepatic lesions are identified. No intra or extrahepatic biliary dilatation. The gallbladder is normal. Pancreas:  No mass, inflammation or ductal dilatation. Spleen:  Normal size.  No focal lesions. Adrenals/Urinary Tract: The adrenal glands and kidneys are unremarkable. No hydronephrosis or renal lesions. The bladder is mildly distended but no bladder lesions or asymmetric bladder wall thickening. Stomach/Bowel: The stomach, duodenum, small bowel and colon are grossly normal. No findings for acute inflammatory process, mass lesions or obstructive findings. The terminal ileum is grossly normal without IV contrast. The appendix is normal. Vascular/Lymphatic: No pathologically enlarged lymph nodes identified. No abdominal aortic aneurysm demonstrated. Reproductive: The uterus and ovaries are normal. Multiple nabothian cysts are noted at the cervix. Other:  Small amount of free abdominal/pelvic fluid. Musculoskeletal: No significant bony findings. IMPRESSION: 1. No acute abdominal/pelvic findings, mass lesions or lymphadenopathy. 2. No MR findings without contrast to suggest acute/active Crohn's disease. The appendix and ovaries are unremarkable. 3. Small amount of free abdominal/pelvic fluid which may be physiologic.  Electronically Signed   By: Marijo Sanes M.D.   On: 07/27/2018 07:29   Mr Abdomen Wo Contrast  Result Date: 07/27/2018 CLINICAL DATA:  History of Crohn's disease with abdominal pain and nausea and vomiting. EXAM: MRI ABDOMEN AND PELVIS WITHOUT CONTRAST TECHNIQUE: Multiplanar multisequence MR imaging of the abdomen and pelvis was performed. No intravenous contrast was administered. COMPARISON:  Ultrasound 07/26/2018 and CT scan 12/09/2016 FINDINGS: COMBINED FINDINGS FOR BOTH MR ABDOMEN AND PELVIS Lower chest: The lung bases are grossly clear. No worrisome pulmonary lesions, pleural or pericardial effusion. Hepatobiliary: No hepatic lesions are identified. No intra or extrahepatic biliary dilatation. The gallbladder is normal. Pancreas:  No mass, inflammation or ductal dilatation. Spleen:  Normal size.  No focal lesions. Adrenals/Urinary Tract: The adrenal glands and kidneys are unremarkable. No hydronephrosis or renal lesions. The bladder is mildly distended but no bladder lesions or asymmetric bladder wall thickening. Stomach/Bowel: The  stomach, duodenum, small bowel and colon are grossly normal. No findings for acute inflammatory process, mass lesions or obstructive findings. The terminal ileum is grossly normal without IV contrast. The appendix is normal. Vascular/Lymphatic: No pathologically enlarged lymph nodes identified. No abdominal aortic aneurysm demonstrated. Reproductive: The uterus and ovaries are normal. Multiple nabothian cysts are noted at the cervix. Other:  Small amount of free abdominal/pelvic fluid. Musculoskeletal: No significant bony findings. IMPRESSION: 1. No acute abdominal/pelvic findings, mass lesions or lymphadenopathy. 2. No MR findings without contrast to suggest acute/active Crohn's disease. The appendix and ovaries are unremarkable. 3. Small amount of free abdominal/pelvic fluid which may be physiologic. Electronically Signed   By: Marijo Sanes M.D.   On: 07/27/2018 07:29   US  Ob Comp < 14 Wks  Result Date: 07/26/2018 CLINICAL DATA:  Lower abdominal pain. Vomiting. Beta HCG 123. Gestational age by last menstrual. Three weeks and 4 days. EXAM: OBSTETRIC <14 WK [place] AND TRANSVAGINAL OB US TECHNIQUE: Both transabdominal and transvaginal ultrasound examinations were performed for complete evaluation of the gestation as well as the maternal uterus, adnexal regions, and pelvic cul-de-sac. Transvaginal technique was performed to assess early pregnancy. COMPARISON:  None. FINDINGS: Intrauterine gestational sac: Not present Yolk sac:  Not present with marginal vascularity. Embryo:  Not present Cardiac Activity: Not present Subchorionic hemorrhage:  None visualized. Maternal uterus/adnexae: Multiple nabothian cysts at the level of the cervix. Small volume free fluid in the pelvis. RIGHT intra-ovarian isoechoic 16 mm corpus luteal cyst. LEFT ovary not sonographically identified. IMPRESSION: 1. Pregnancy of unknown anatomic location (no intrauterine gestational sac or adnexal mass identified). Differential diagnosis includes recent spontaneous miscarriage, IUP too early to visualize, and non-visualized ectopic pregnancy. Recommend correlation with serial beta-hCG levels, and follow up US if warranted clinically. Please note, with beta HCG of less than 3,000, pregnancy may not be sonographically evident. Electronically Signed   By: Elon Alas M.D.   On: 07/26/2018 18:53   US Ob Transvaginal  Result Date: 08/09/2018 CLINICAL DATA:  Pregnant, abdominal cramping EXAM: TRANSVAGINAL OB ULTRASOUND TECHNIQUE: Transvaginal ultrasound was performed for complete evaluation of the gestation as well as the maternal uterus, adnexal regions, and pelvic cul-de-sac. COMPARISON:  None. FINDINGS: Intrauterine gestational sac: Single Yolk sac:  Visualized. Embryo:  Not Visualized. MSD: 12.6 mm   6 w   0 d Subchorionic hemorrhage:  None visualized. Maternal uterus/adnexae: Bilateral ovaries are not discretely  visualized. No free fluid. IMPRESSION: Single intrauterine gestational sac with yolk sac, measuring 6 weeks 0 days by mean sac diameter. A fetal pole is not yet visualized. Consider follow-up pelvic ultrasound in 14 days to confirm viability, as clinically warranted. Electronically Signed   By: Julian Hy M.D.   On: 08/09/2018 16:12   US Ob Transvaginal  Result Date: 07/26/2018 CLINICAL DATA:  Lower abdominal pain. Vomiting. Beta HCG 123. Gestational age by last menstrual. Three weeks and 4 days. EXAM: OBSTETRIC <14 WK [place] AND TRANSVAGINAL OB US TECHNIQUE: Both transabdominal and transvaginal ultrasound examinations were performed for complete evaluation of the gestation as well as the maternal uterus, adnexal regions, and pelvic cul-de-sac. Transvaginal technique was performed to assess early pregnancy. COMPARISON:  None. FINDINGS: Intrauterine gestational sac: Not present Yolk sac:  Not present with marginal vascularity. Embryo:  Not present Cardiac Activity: Not present Subchorionic hemorrhage:  None visualized. Maternal uterus/adnexae: Multiple nabothian cysts at the level of the cervix. Small volume free fluid in the pelvis. RIGHT intra-ovarian isoechoic 16 mm corpus luteal cyst. LEFT ovary  not sonographically identified. IMPRESSION: 1. Pregnancy of unknown anatomic location (no intrauterine gestational sac or adnexal mass identified). Differential diagnosis includes recent spontaneous miscarriage, IUP too early to visualize, and non-visualized ectopic pregnancy. Recommend correlation with serial beta-hCG levels, and follow up US if warranted clinically. Please note, with beta HCG of less than 3,000, pregnancy may not be sonographically evident. Electronically Signed   By: Elon Alas M.D.   On: 07/26/2018 18:53    Assessment: Frances Mccormick is  32 y.o. G2P0010 at [redacted]w[redacted]d presents with N/V/D after taking zithromax.  Plan: IVF, IV phenergan, immodium  Care turned over to [facility], [employer]  [place] 9/11/20197:45 AM

## 2018-08-12 ENCOUNTER — Other Ambulatory Visit: Payer: Self-pay

## 2018-08-12 ENCOUNTER — Emergency Department (HOSPITAL_COMMUNITY): Payer: Medicaid Other

## 2018-08-12 ENCOUNTER — Encounter (HOSPITAL_COMMUNITY): Payer: Self-pay | Admitting: Emergency Medicine

## 2018-08-12 ENCOUNTER — Emergency Department (HOSPITAL_COMMUNITY)
Admission: EM | Admit: 2018-08-12 | Discharge: 2018-08-12 | Disposition: A | Discharge status: Home / Self Care | Payer: Medicaid Other | Attending: Emergency Medicine | Admitting: Emergency Medicine

## 2018-08-12 DIAGNOSIS — O219 Vomiting of pregnancy, unspecified: Secondary | ICD-10-CM | POA: Diagnosis not present

## 2018-08-12 DIAGNOSIS — Z79899 Other long term (current) drug therapy: Secondary | ICD-10-CM | POA: Diagnosis not present

## 2018-08-12 DIAGNOSIS — O26891 Other specified pregnancy related conditions, first trimester: Secondary | ICD-10-CM | POA: Insufficient documentation

## 2018-08-12 DIAGNOSIS — R112 Nausea with vomiting, unspecified: Secondary | ICD-10-CM

## 2018-08-12 DIAGNOSIS — R1011 Right upper quadrant pain: Secondary | ICD-10-CM | POA: Insufficient documentation

## 2018-08-12 DIAGNOSIS — R109 Unspecified abdominal pain: Secondary | ICD-10-CM

## 2018-08-12 DIAGNOSIS — Z3A01 Less than 8 weeks gestation of pregnancy: Secondary | ICD-10-CM | POA: Insufficient documentation

## 2018-08-12 LAB — CBC WITH DIFFERENTIAL/PLATELET
Basophils Absolute: 0 10*3/uL (ref 0.0–0.1)
Basophils Relative: 0 %
EOS PCT: 0 %
Eosinophils Absolute: 0 10*3/uL (ref 0.0–0.7)
HCT: 42.8 % (ref 36.0–46.0)
Hemoglobin: 14.7 g/dL (ref 12.0–15.0)
LYMPHS PCT: 8 %
Lymphs Abs: 1.6 10*3/uL (ref 0.7–4.0)
MCH: 27 pg (ref 26.0–34.0)
MCHC: 34.3 g/dL (ref 30.0–36.0)
MCV: 78.7 fL (ref 78.0–100.0)
MONO ABS: 0.8 10*3/uL (ref 0.1–1.0)
Monocytes Relative: 4 %
Neutro Abs: 17.6 10*3/uL — ABNORMAL HIGH (ref 1.7–7.7)
Neutrophils Relative %: 88 %
PLATELETS: 359 10*3/uL (ref 150–400)
RBC: 5.44 MIL/uL — ABNORMAL HIGH (ref 3.87–5.11)
RDW: 15.6 % — AB (ref 11.5–15.5)
WBC: 20.1 10*3/uL — ABNORMAL HIGH (ref 4.0–10.5)

## 2018-08-12 LAB — URINALYSIS, ROUTINE W REFLEX MICROSCOPIC
BILIRUBIN URINE: NEGATIVE
Glucose, UA: NEGATIVE mg/dL
Hgb urine dipstick: NEGATIVE
Ketones, ur: 80 mg/dL — AB
Leukocytes, UA: NEGATIVE
Nitrite: NEGATIVE
Protein, ur: 30 mg/dL — AB
SPECIFIC GRAVITY, URINE: 1.011 (ref 1.005–1.030)
pH: 7 (ref 5.0–8.0)

## 2018-08-12 LAB — COMPREHENSIVE METABOLIC PANEL
ALBUMIN: 3.8 g/dL (ref 3.5–5.0)
ALK PHOS: 54 U/L (ref 38–126)
ALT: 19 U/L (ref 0–44)
ANION GAP: 14 (ref 5–15)
AST: 23 U/L (ref 15–41)
BILIRUBIN TOTAL: 0.6 mg/dL (ref 0.3–1.2)
BUN: 7 mg/dL (ref 6–20)
CALCIUM: 9 mg/dL (ref 8.9–10.3)
CO2: 22 mmol/L (ref 22–32)
CREATININE: 0.7 mg/dL (ref 0.44–1.00)
Chloride: 101 mmol/L (ref 98–111)
GFR calc Af Amer: >60 mL/min (ref >60)
GFR calc non Af Amer: >60 mL/min (ref >60)
GLUCOSE: 107 mg/dL — AB (ref 70–99)
Potassium: 2.9 mmol/L — ABNORMAL LOW (ref 3.5–5.1)
Sodium: 137 mmol/L (ref 135–145)
TOTAL PROTEIN: 8.1 g/dL (ref 6.5–8.1)

## 2018-08-12 LAB — LIPASE, BLOOD: Lipase: 30 U/L (ref 11–51)

## 2018-08-12 MED ORDER — MORPHINE SULFATE (PF) 2 MG/ML IV SOLN
2.0000 mg | Freq: Once | INTRAVENOUS | Status: AC
  Administered 2018-08-12: 2 mg via INTRAVENOUS
  Filled 2018-08-12: qty 1

## 2018-08-12 MED ORDER — PROMETHAZINE HCL 25 MG/ML IJ SOLN
12.5000 mg | Freq: Once | INTRAMUSCULAR | Status: AC
  Administered 2018-08-12: 12.5 mg via INTRAVENOUS
  Filled 2018-08-12: qty 1

## 2018-08-12 MED ORDER — METOCLOPRAMIDE HCL 5 MG/ML IJ SOLN
10.0000 mg | Freq: Once | INTRAMUSCULAR | Status: AC
  Administered 2018-08-12: 10 mg via INTRAVENOUS
  Filled 2018-08-12: qty 2

## 2018-08-12 MED ORDER — PROMETHAZINE HCL 25 MG RE SUPP: 25.0000 mg | Freq: Four times a day (QID) | RECTAL | 0 refills | Status: DC | PRN

## 2018-08-12 MED ORDER — SODIUM CHLORIDE 0.9 % IV BOLUS
1000.0000 mL | Freq: Once | INTRAVENOUS | Status: AC
  Administered 2018-08-12: 1000 mL via INTRAVENOUS

## 2018-08-12 MED ORDER — HYDROCODONE-ACETAMINOPHEN 5-325 MG PO TABS: 1.0000 | ORAL_TABLET | Freq: Four times a day (QID) | ORAL | 0 refills | Status: DC | PRN

## 2018-08-12 MED ORDER — POTASSIUM CHLORIDE CRYS ER 20 MEQ PO TBCR
20.0000 meq | EXTENDED_RELEASE_TABLET | Freq: Once | ORAL | Status: AC
  Administered 2018-08-12: 20 meq via ORAL
  Filled 2018-08-12: qty 1

## 2018-08-12 NOTE — ED Notes (Signed)
Patient transported to MRI 

## 2018-08-12 NOTE — ED Provider Notes (Signed)
[facility] DEPT Provider Note   CSN: [id number] Arrival date & time: 08/12/18  0535     History   Chief Complaint No chief complaint on file.   HPI Frances Mccormick is a 32 y.o. female ending for evaluation of abdominal pain, nausea, vomiting.  Patient states she initially developed abdominal pain 3 days ago.  She was seen at [facility], found to have chlamydia and treated with antibiotics.  The next day, she had worsening symptoms, felt this was due to the antibiotics.  She was treated again at the [facility] hospital with IV medications, discharged with blood pressure and antiemetic medications.  Patient states yesterday, she had recurrence of her symptoms.  She reports worsening vomiting, multiple times an hour.  She states it initially appeared blood-streaked and out her vomit is dark.  She tried 1 dose of her Reglan without trying any of her other antiemetics.  She has not taken her blood pressure medicine.  She denies fevers, chills, chest pain, shortness of breath, urinary symptoms, abnormal bowel movements.  She is pregnant, she is not sure how far along, and is planning on terminating the pregnancy.  Additional history obtained from chart review, patient approximately [redacted] weeks along.  Has had an ultrasound which showed an IUP. Pt with h/o chrons, not on medication  HPI  Past Medical History:  Diagnosis Date  . Crohn's disease ([place])   . Migraine    "a few/year" (07/27/2018)  . Non-compliant patient     Patient Active Problem List   Diagnosis Date Noted  . Chronic hypertension during pregnancy, antepartum 08/11/2018  . Nausea & vomiting 07/26/2018  . Intractable vomiting with nausea 10/28/2014  . Medically noncompliant 10/26/2014  . Crohn's disease ([place]) 10/26/2014    Past Surgical History:  Procedure Laterality Date  . INDUCED ABORTION       OB History    Gravida  2   Para  0   Term      Preterm      AB  1   Living        SAB      TAB      Ectopic      Multiple      Live Births               Home Medications    Prior to Admission medications   Medication Sig Start Date End Date Taking? Authorizing Provider  acetaminophen (TYLENOL) 325 MG tablet Take 325 mg by mouth every 6 (six) hours as needed for mild pain.   Yes [provider]  labetalol (NORMODYNE) 200 MG tablet Take 1 tablet (200 mg total) by mouth 2 (two) times daily. 08/11/18  Yes Leftwich-Kirby, Kathie Dike, CNM  metoCLOPramide (REGLAN) 10 MG tablet Take 1 tablet (10 mg total) by mouth 3 (three) times daily before meals. 08/11/18  Yes Leftwich-Kirby, Kathie Dike, CNM  promethazine (PHENERGAN) 25 MG tablet Take 0.5-1 tablets (12.5-25 mg total) by mouth at bedtime as needed for nausea. 08/11/18  Yes Leftwich-Kirby, Kathie Dike, CNM  HYDROcodone-acetaminophen (NORCO/VICODIN) 5-325 MG tablet Take 1 tablet by mouth every 6 (six) hours as needed for severe pain. 08/12/18   Karel Mowers, PA-C  prenatal vitamin w/FE, FA (NATACHEW) 29-1 MG CHEW chewable tablet Chew 1 tablet by mouth daily at 12 noon. Patient not taking: Reported on 08/12/2018 07/27/18   Jonetta Osgood, MD  promethazine (PHENERGAN) 25 MG suppository Place 1 suppository (25 mg total) rectally every 6 (  six) hours as needed for nausea or vomiting. 08/12/18   Nico Syme, PA-C    Family History Family History  Problem Relation Age of Onset  . Diabetes Mother   . Diabetes Father     Social History Social History   Tobacco Use  . Smoking status: Never Smoker  . Smokeless tobacco: Never Used  Substance Use Topics  . Alcohol use: Not Currently    Alcohol/week: 2.0 standard drinks    Types: 1 Glasses of wine, 1 Cans of beer per week  . Drug use: No     Allergies   Bee venom and Ciprofloxacin   Review of Systems Review of Systems  Gastrointestinal: Positive for abdominal pain, nausea and vomiting.  All other systems reviewed and are negative.    Physical  Exam Updated Vital Signs BP (!) 114/58 (BP Location: Right Arm)   Pulse 61   Resp 16   Ht 5' (1.524 m)   Wt 89.4 kg   LMP 06/27/2018   SpO2 98%   BMI 38.47 kg/m   Physical Exam  Constitutional: She is oriented to person, place, and time. She appears well-developed and well-nourished. No distress.  Appears in no distress  HENT:  Head: Normocephalic and atraumatic.  Eyes: Pupils are equal, round, and reactive to light. Conjunctivae and EOM are normal.  Neck: Normal range of motion. Neck supple.  Cardiovascular: Normal rate, regular rhythm and intact distal pulses.  Pulmonary/Chest: Effort normal and breath sounds normal. No respiratory distress. She has no wheezes.  Abdominal: Soft. She exhibits no distension and no mass. There is tenderness. There is guarding. There is no rebound.  Tenderness palpation of epigastric, right upper, and right lower quadrants.  No tenderness palpation of the left side.  No rigidity or distention.  Negative rebound.  No signs of peritonitis.  Musculoskeletal: Normal range of motion.  Neurological: She is alert and oriented to person, place, and time.  Skin: Skin is warm and dry.  Psychiatric: She has a normal mood and affect.  Nursing note and vitals reviewed.    ED Treatments / Results  Labs (all labs ordered are listed, but only abnormal results are displayed) Labs Reviewed  CBC WITH DIFFERENTIAL/PLATELET - Abnormal; Notable for the following components:      Result Value   WBC 20.1 (*)    RBC 5.44 (*)    RDW 15.6 (*)    Neutro Abs 17.6 (*)    All other components within normal limits  URINALYSIS, ROUTINE W REFLEX MICROSCOPIC - Abnormal; Notable for the following components:   Ketones, ur 80 (*)    Protein, ur 30 (*)    Bacteria, UA RARE (*)    All other components within normal limits  COMPREHENSIVE METABOLIC PANEL - Abnormal; Notable for the following components:   Potassium 2.9 (*)    Glucose, Bld 107 (*)    All other components  within normal limits  LIPASE, BLOOD    EKG None  Radiology Mr Pelvis Wo Contrast  Result Date: 08/12/2018 CLINICAL DATA:  Lower abdominal cramping with nausea and vomiting. Right flank pain. [redacted] weeks pregnant. Possible appendicitis. EXAM: MRI ABDOMEN AND PELVIS WITHOUT CONTRAST TECHNIQUE: Multiplanar multisequence MR imaging of the abdomen and pelvis was performed. No intravenous contrast was administered. COMPARISON:  MRI 07/27/2018.  CT scan 12/09/2016. FINDINGS: COMBINED FINDINGS FOR BOTH MR ABDOMEN AND PELVIS Incomplete study.  The patient was unable to complete the exam. Lower chest: Unremarkable. Hepatobiliary: Several tiny cysts or biliary hamartomas noted  within the liver parenchyma. Gallbladder unremarkable. No intrahepatic or extrahepatic biliary dilation. Pancreas: No focal mass lesion. No dilatation of the main duct. No intraparenchymal cyst. No peripancreatic edema. Spleen:  No splenomegaly. No focal mass lesion. Adrenals/Urinary Tract: No adrenal nodule or mass. No hydronephrosis or hydroureter. Bladder nondistended. Stomach/Bowel: Stomach is nondistended. No gastric wall thickening. No evidence of outlet obstruction. No small bowel dilatation. No evidence for edema within the small bowel mesentery. Terminal ileum shows no wall thickening or perienteric edema. Retrocecal appendix is well visualized on axial imaging and is normal without associated edema or inflammation. Vascular/Lymphatic: No abdominal aortic aneurysm. No abdominal lymphadenopathy. No pelvic lymphadenopathy. Reproductive: Intrauterine gestational sac visualized. No adnexal mass. Other:  Trace free fluid noted in the pelvis. Musculoskeletal: No abnormal marrow signal within the visualized bony anatomy. IMPRESSION: 1. Incomplete exam. Patient was unable to remain in the scanner for completion of imaging sequences. 2. Within this limitation, the appendix is well-visualized and is normal. 3. No abnormality identified in the  terminal ileum. No bowel obstruction or evidence of ileus. No hydroureteronephrosis on either side. No adnexal mass. Electronically Signed   By: Misty Stanley M.D.   On: 08/12/2018 13:09   Mr Abdomen Wo Contrast  Result Date: 08/12/2018 CLINICAL DATA:  Lower abdominal cramping with nausea and vomiting. Right flank pain. [redacted] weeks pregnant. Possible appendicitis. EXAM: MRI ABDOMEN AND PELVIS WITHOUT CONTRAST TECHNIQUE: Multiplanar multisequence MR imaging of the abdomen and pelvis was performed. No intravenous contrast was administered. COMPARISON:  MRI 07/27/2018.  CT scan 12/09/2016. FINDINGS: COMBINED FINDINGS FOR BOTH MR ABDOMEN AND PELVIS Incomplete study.  The patient was unable to complete the exam. Lower chest: Unremarkable. Hepatobiliary: Several tiny cysts or biliary hamartomas noted within the liver parenchyma. Gallbladder unremarkable. No intrahepatic or extrahepatic biliary dilation. Pancreas: No focal mass lesion. No dilatation of the main duct. No intraparenchymal cyst. No peripancreatic edema. Spleen:  No splenomegaly. No focal mass lesion. Adrenals/Urinary Tract: No adrenal nodule or mass. No hydronephrosis or hydroureter. Bladder nondistended. Stomach/Bowel: Stomach is nondistended. No gastric wall thickening. No evidence of outlet obstruction. No small bowel dilatation. No evidence for edema within the small bowel mesentery. Terminal ileum shows no wall thickening or perienteric edema. Retrocecal appendix is well visualized on axial imaging and is normal without associated edema or inflammation. Vascular/Lymphatic: No abdominal aortic aneurysm. No abdominal lymphadenopathy. No pelvic lymphadenopathy. Reproductive: Intrauterine gestational sac visualized. No adnexal mass. Other:  Trace free fluid noted in the pelvis. Musculoskeletal: No abnormal marrow signal within the visualized bony anatomy. IMPRESSION: 1. Incomplete exam. Patient was unable to remain in the scanner for completion of imaging  sequences. 2. Within this limitation, the appendix is well-visualized and is normal. 3. No abnormality identified in the terminal ileum. No bowel obstruction or evidence of ileus. No hydroureteronephrosis on either side. No adnexal mass. Electronically Signed   By: Misty Stanley M.D.   On: 08/12/2018 13:09   US Abdomen Limited Ruq  Result Date: 08/12/2018 CLINICAL DATA:  Right upper quadrant pain EXAM: ULTRASOUND ABDOMEN LIMITED RIGHT UPPER QUADRANT COMPARISON:  CT abdomen and pelvis December 09, 2016 FINDINGS: Gallbladder: No gallstones or wall thickening visualized. There is no pericholecystic fluid. No sonographic Murphy sign noted by sonographer. Common bile duct: Diameter: 5 mm. No intrahepatic or extrahepatic biliary duct dilatation. Liver: No focal lesion identified. Within normal limits in parenchymal echogenicity. Portal vein is patent on color Doppler imaging with normal direction of blood flow towards the liver. IMPRESSION: Study within normal limits.  Electronically Signed   By: Lowella Grip III M.D.   On: 08/12/2018 09:10    Procedures Procedures (including critical care time)  Medications Ordered in ED Medications  sodium chloride 0.9 % bolus 1,000 mL (0 mLs Intravenous Stopped 08/12/18 1145)  morphine 2 MG/ML injection 2 mg (2 mg Intravenous Given 08/12/18 0820)  metoCLOPramide (REGLAN) injection 10 mg (10 mg Intravenous Given 08/12/18 0820)  potassium chloride SA (K-DUR,KLOR-CON) CR tablet 20 mEq (20 mEq Oral Given 08/12/18 0915)  promethazine (PHENERGAN) injection 12.5 mg (12.5 mg Intravenous Given 08/12/18 1234)  metoCLOPramide (REGLAN) injection 10 mg (10 mg Intravenous Given 08/12/18 1059)  morphine 2 MG/ML injection 2 mg (2 mg Intravenous Given 08/12/18 1235)     Initial Impression / Assessment and Plan / ED Course  I have reviewed the triage vital signs and the nursing notes.  Pertinent labs & imaging results that were available during my care of the patient were reviewed by  me and considered in my medical decision making (see chart for details).     Pt presenting for evaluation of nausea, vomiting, abdominal pain.  Initial exam reassuring patient is afebrile not tachycardic.  Appears nontoxic.  However, has been seen twice in the past all days for the same, she is not women's hospital discharge.  Symptoms worsened today.  Will obtain labs, start treatment, and reassess.  Labs concerning, leukocytosis increased to 20, 12   2 days ago.  Hypokalemia 2.9, potassium given.  Urine without obvious infection.  Will obtain ultrasound to rule out gallbladder etiology.  Ultrasound negative for acute findings or infection.  As symptoms have worsened with new white count, will obtain MRI of the abdomen, as I am unable to get an x-ray or CT due to pregnancy.  Patient did not tolerate MRI, even though she did so several weeks ago.  Discussed options with patient, offered morphine and Phenergan for calming and repeat MRI attempt.  Patient declined.  Offered CT, caveat that this may cause effects on the fetus if she does choose to keep the pregnancy.  Patient declined.  Offered watchful waiting, treat at home with Phenergan suppositories, pain medication, slowly progressing liquid to bland diet.  Patient elects to watch and wait.  Patient understands that since there is no further imaging or screening, she may have an infection or condition that we do not know about that could require surgery and could be potentially life-threatening.  Patient states she understands, but does not want further screening.  Tolerated p.o. in the ER without difficulty.  At this time, patient appears safe for discharge.  Return precautions given.  Patient states she understands and agrees plan.  Final Clinical Impressions(s) / ED Diagnoses   Final diagnoses:  Abdominal pain  Non-intractable vomiting with nausea, unspecified vomiting type  Less than [redacted] weeks gestation of pregnancy    ED Discharge Orders          Ordered    promethazine (PHENERGAN) 25 MG suppository  Every 6 hours PRN,   Status:  Discontinued     08/12/18 1331    HYDROcodone-acetaminophen (NORCO/VICODIN) 5-325 MG tablet  Every 6 hours PRN     08/12/18 1337    promethazine (PHENERGAN) 25 MG suppository  Every 6 hours PRN     08/12/18 [place], Fredi Geiler, PA-C 08/12/18 1604    Molpus, John, MD 08/12/18 2243

## 2018-08-12 NOTE — ED Notes (Signed)
Ultrasound in room

## 2018-08-12 NOTE — ED Notes (Signed)
Urine culture sent to lab with UA sample 

## 2018-08-12 NOTE — Discharge Instructions (Signed)
Continue using antinausea medication as prescribed.  Use Phenergan suppositories if you are not tolerating p.o. Over the next several days, gradually progress your diet.  Start with a liquid diet, this includes drinks and broths.  Graduate to soft foods, including Jell-O and plain/bland foods. Use Tylenol as needed for mild to moderate pain. Use norco for severe pain.  Return to the emergency room if you develop worsening pain, fevers, persistent nausea and vomiting despite medication, or any new or concerning symptoms.  Follow-up with OB/GYN for further management of your pregnancy.

## 2018-08-20 ENCOUNTER — Encounter (HOSPITAL_COMMUNITY): Payer: Self-pay | Admitting: Emergency Medicine

## 2018-08-20 ENCOUNTER — Other Ambulatory Visit: Payer: Self-pay

## 2018-08-20 ENCOUNTER — Emergency Department (HOSPITAL_COMMUNITY)
Admission: EM | Admit: 2018-08-20 | Discharge: 2018-08-21 | Disposition: A | Discharge status: Home / Self Care | Payer: Medicaid Other | Attending: Emergency Medicine | Admitting: Emergency Medicine

## 2018-08-20 DIAGNOSIS — O218 Other vomiting complicating pregnancy: Secondary | ICD-10-CM | POA: Insufficient documentation

## 2018-08-20 DIAGNOSIS — Z3490 Encounter for supervision of normal pregnancy, unspecified, unspecified trimester: Secondary | ICD-10-CM

## 2018-08-20 DIAGNOSIS — R102 Pelvic and perineal pain: Secondary | ICD-10-CM | POA: Diagnosis not present

## 2018-08-20 DIAGNOSIS — Z79899 Other long term (current) drug therapy: Secondary | ICD-10-CM | POA: Insufficient documentation

## 2018-08-20 DIAGNOSIS — Z3A01 Less than 8 weeks gestation of pregnancy: Secondary | ICD-10-CM | POA: Insufficient documentation

## 2018-08-20 DIAGNOSIS — R112 Nausea with vomiting, unspecified: Secondary | ICD-10-CM

## 2018-08-20 DIAGNOSIS — O9989 Other specified diseases and conditions complicating pregnancy, childbirth and the puerperium: Secondary | ICD-10-CM | POA: Diagnosis present

## 2018-08-20 LAB — CBC WITH DIFFERENTIAL/PLATELET
BASOS PCT: 0 %
Basophils Absolute: 0 10*3/uL (ref 0.0–0.1)
Eosinophils Absolute: 0 10*3/uL (ref 0.0–0.7)
Eosinophils Relative: 0 %
HEMATOCRIT: 41.7 % (ref 36.0–46.0)
HEMOGLOBIN: 14.5 g/dL (ref 12.0–15.0)
LYMPHS ABS: 1.4 10*3/uL (ref 0.7–4.0)
LYMPHS PCT: 6 %
MCH: 27.2 pg (ref 26.0–34.0)
MCHC: 34.8 g/dL (ref 30.0–36.0)
MCV: 78.2 fL (ref 78.0–100.0)
MONOS PCT: 2 %
Monocytes Absolute: 0.5 10*3/uL (ref 0.1–1.0)
NEUTROS ABS: 21 10*3/uL — AB (ref 1.7–7.7)
NEUTROS PCT: 92 %
Platelets: 409 10*3/uL — ABNORMAL HIGH (ref 150–400)
RBC: 5.33 MIL/uL — ABNORMAL HIGH (ref 3.87–5.11)
RDW: 15.5 % (ref 11.5–15.5)
WBC: 22.9 10*3/uL — ABNORMAL HIGH (ref 4.0–10.5)

## 2018-08-20 LAB — BASIC METABOLIC PANEL
ANION GAP: 12 (ref 5–15)
BUN: <5 mg/dL — ABNORMAL LOW (ref 6–20)
CO2: 23 mmol/L (ref 22–32)
Calcium: 9.3 mg/dL (ref 8.9–10.3)
Chloride: 100 mmol/L (ref 98–111)
Creatinine, Ser: 0.65 mg/dL (ref 0.44–1.00)
GFR calc non Af Amer: >60 mL/min (ref >60)
Glucose, Bld: 114 mg/dL — ABNORMAL HIGH (ref 70–99)
Potassium: 3.7 mmol/L (ref 3.5–5.1)
Sodium: 135 mmol/L (ref 135–145)

## 2018-08-20 LAB — I-STAT CHEM 8, ED
CREATININE: 0.5 mg/dL (ref 0.44–1.00)
Calcium, Ion: 1.03 mmol/L — ABNORMAL LOW (ref 1.15–1.40)
Chloride: 100 mmol/L (ref 98–111)
Glucose, Bld: 120 mg/dL — ABNORMAL HIGH (ref 70–99)
HEMATOCRIT: 46 % (ref 36.0–46.0)
Hemoglobin: 15.6 g/dL — ABNORMAL HIGH (ref 12.0–15.0)
Potassium: 3.7 mmol/L (ref 3.5–5.1)
Sodium: 133 mmol/L — ABNORMAL LOW (ref 135–145)
TCO2: 23 mmol/L (ref 22–32)

## 2018-08-20 LAB — I-STAT BETA HCG BLOOD, ED (MC, WL, AP ONLY): I-stat hCG, quantitative: >2000 m[IU]/mL — ABNORMAL HIGH (ref <5)

## 2018-08-20 MED ORDER — SODIUM CHLORIDE 0.9 % IV BOLUS
1000.0000 mL | Freq: Once | INTRAVENOUS | Status: AC
  Administered 2018-08-20: 1000 mL via INTRAVENOUS

## 2018-08-20 MED ORDER — ONDANSETRON 4 MG PO TBDP
4.0000 mg | ORAL_TABLET | Freq: Once | ORAL | Status: AC
  Administered 2018-08-20: 4 mg via ORAL
  Filled 2018-08-20: qty 1

## 2018-08-20 NOTE — ED Triage Notes (Signed)
Pt took the abortion pill this morning at 11a and reports N/V and cramping since about 2p. No vaginal bleeding, no diarrhea, no fever. A&Ox4. Ambulatory.

## 2018-08-21 MED ORDER — PROMETHAZINE HCL 25 MG/ML IJ SOLN
12.5000 mg | Freq: Once | INTRAMUSCULAR | Status: AC
  Administered 2018-08-21: 12.5 mg via INTRAVENOUS
  Filled 2018-08-21: qty 1

## 2018-08-21 MED ORDER — HYDROCODONE-ACETAMINOPHEN 5-325 MG PO TABS: 1.0000 | ORAL_TABLET | Freq: Four times a day (QID) | ORAL | 0 refills | Status: DC | PRN

## 2018-08-21 MED ORDER — MORPHINE SULFATE (PF) 4 MG/ML IV SOLN
4.0000 mg | Freq: Once | INTRAVENOUS | Status: AC
  Administered 2018-08-21: 4 mg via INTRAVENOUS
  Filled 2018-08-21: qty 1

## 2018-08-21 MED ORDER — KETOROLAC TROMETHAMINE 30 MG/ML IJ SOLN
30.0000 mg | Freq: Once | INTRAMUSCULAR | Status: AC
  Administered 2018-08-21: 30 mg via INTRAVENOUS
  Filled 2018-08-21: qty 1

## 2018-08-21 MED ORDER — SODIUM CHLORIDE 0.9 % IV BOLUS
1000.0000 mL | Freq: Once | INTRAVENOUS | Status: AC
  Administered 2018-08-21: 1000 mL via INTRAVENOUS

## 2018-08-21 MED ORDER — ONDANSETRON HCL 4 MG/2ML IJ SOLN
4.0000 mg | Freq: Once | INTRAMUSCULAR | Status: AC
  Administered 2018-08-21: 4 mg via INTRAVENOUS
  Filled 2018-08-21: qty 2

## 2018-08-21 MED ORDER — ONDANSETRON 8 MG PO TBDP: ORAL_TABLET | ORAL | 0 refills | Status: DC

## 2018-08-21 NOTE — ED Provider Notes (Signed)
[facility] DEPT Provider Note   CSN: [id number] Arrival date & time: 08/20/18  2019     History   Chief Complaint No chief complaint on file.   HPI Frances Mccormick is a 32 y.o. female.  Patient is a 32 year old female pregnant approximately [redacted] weeks gestation.  She presents today with nausea, vomiting, and lower abdominal cramping.  She was seen at [facility] earlier this morning.  She had an ultrasound confirming an IUP, then elected to proceed with the "abortion pill".  She took this pill this morning, then this afternoon developed cramping, nausea, and vomiting.  She denies any bloody stool or urinary complaints.  She denies any fevers or chills.  The history is provided by the patient.    Past Medical History:  Diagnosis Date  . Crohn's disease ([place])   . Migraine    "a few/year" (07/27/2018)  . Non-compliant patient     Patient Active Problem List   Diagnosis Date Noted  . Chronic hypertension during pregnancy, antepartum 08/11/2018  . Nausea & vomiting 07/26/2018  . Intractable vomiting with nausea 10/28/2014  . Medically noncompliant 10/26/2014  . Crohn's disease ([place]) 10/26/2014    Past Surgical History:  Procedure Laterality Date  . INDUCED ABORTION       OB History    Gravida  2   Para  0   Term      Preterm      AB  1   Living        SAB      TAB      Ectopic      Multiple      Live Births               Home Medications    Prior to Admission medications   Medication Sig Start Date End Date Taking? Authorizing Provider  acetaminophen (TYLENOL) 325 MG tablet Take 325 mg by mouth every 6 (six) hours as needed for mild pain.    [provider]  HYDROcodone-acetaminophen (NORCO/VICODIN) 5-325 MG tablet Take 1 tablet by mouth every 6 (six) hours as needed for severe pain. 08/12/18   Caccavale, Sophia, PA-C  labetalol (NORMODYNE) 200 MG tablet Take 1 tablet (200 mg total) by mouth 2 (two)  times daily. 08/11/18   Leftwich-Kirby, Kathie Dike, CNM  metoCLOPramide (REGLAN) 10 MG tablet Take 1 tablet (10 mg total) by mouth 3 (three) times daily before meals. 08/11/18   Leftwich-Kirby, Kathie Dike, CNM  prenatal vitamin w/FE, FA (NATACHEW) 29-1 MG CHEW chewable tablet Chew 1 tablet by mouth daily at 12 noon. 07/27/18   Ghimire, Henreitta Leber, MD  promethazine (PHENERGAN) 25 MG suppository Place 1 suppository (25 mg total) rectally every 6 (six) hours as needed for nausea or vomiting. 08/12/18   Caccavale, Sophia, PA-C  promethazine (PHENERGAN) 25 MG tablet Take 0.5-1 tablets (12.5-25 mg total) by mouth at bedtime as needed for nausea. 08/11/18   Leftwich-Kirby, Kathie Dike, CNM    Family History Family History  Problem Relation Age of Onset  . Diabetes Mother   . Diabetes Father     Social History Social History   Tobacco Use  . Smoking status: Never Smoker  . Smokeless tobacco: Never Used  Substance Use Topics  . Alcohol use: Not Currently    Alcohol/week: 2.0 standard drinks    Types: 1 Glasses of wine, 1 Cans of beer per week  . Drug use: No     Allergies  Bee venom and Ciprofloxacin   Review of Systems Review of Systems  All other systems reviewed and are negative.    Physical Exam Updated Vital Signs BP (!) 160/81   Pulse 87   Temp 98.5 F (36.9 C) (Oral)   Resp 13   Ht 5' (1.524 m)   Wt 89.4 kg   LMP 06/27/2018   SpO2 100%   BMI 38.47 kg/m   Physical Exam  Constitutional: She is oriented to person, place, and time. She appears well-developed and well-nourished. No distress.  HENT:  Head: Normocephalic and atraumatic.  Neck: Normal range of motion. Neck supple.  Cardiovascular: Normal rate and regular rhythm. Exam reveals no gallop and no friction rub.  No murmur heard. Pulmonary/Chest: Effort normal and breath sounds normal. No respiratory distress. She has no wheezes.  Abdominal: Soft. Bowel sounds are normal. She exhibits no distension. There is tenderness.    Some tenderness in the suprapubic region.  There is no rebound or guarding.  Musculoskeletal: Normal range of motion.  Neurological: She is alert and oriented to person, place, and time.  Skin: Skin is warm and dry. She is not diaphoretic.  Nursing note and vitals reviewed.    ED Treatments / Results  Labs (all labs ordered are listed, but only abnormal results are displayed) Labs Reviewed  BASIC METABOLIC PANEL - Abnormal; Notable for the following components:      Result Value   Glucose, Bld 114 (*)    BUN <5 (*)    All other components within normal limits  CBC WITH DIFFERENTIAL/PLATELET - Abnormal; Notable for the following components:   WBC 22.9 (*)    RBC 5.33 (*)    Platelets 409 (*)    Neutro Abs 21.0 (*)    All other components within normal limits  I-STAT CHEM 8, ED - Abnormal; Notable for the following components:   Sodium 133 (*)    BUN <3 (*)    Glucose, Bld 120 (*)    Calcium, Ion 1.03 (*)    Hemoglobin 15.6 (*)    All other components within normal limits  I-STAT BETA HCG BLOOD, ED ([facility], [facility], [facility] ONLY) - Abnormal; Notable for the following components:   I-stat hCG, quantitative >2,000.0 (*)    All other components within normal limits    EKG None  Radiology No results found.  Procedures Procedures (including critical care time)  Medications Ordered in ED Medications  ketorolac (TORADOL) 30 MG/ML injection 30 mg (has no administration in time range)  morphine 4 MG/ML injection 4 mg (has no administration in time range)  promethazine (PHENERGAN) injection 12.5 mg (has no administration in time range)  sodium chloride 0.9 % bolus 1,000 mL (1,000 mLs Intravenous New Bag/Given 08/20/18 2206)  ondansetron (ZOFRAN-ODT) disintegrating tablet 4 mg (4 mg Oral Given 08/20/18 2316)     Initial Impression / Assessment and Plan / ED Course  I have reviewed the triage vital signs and the nursing notes.  Pertinent labs & imaging results that were available during  my care of the patient were reviewed by me and considered in my medical decision making (see chart for details).  Patient with lower abdominal cramping, nausea, and vomiting several hours after taking the "abortion pill".  She is currently [redacted] weeks pregnant.  Her laboratory studies are unremarkable and she is feeling better after receiving pain and nausea medicines in the ER.  She will be discharged with Zofran, pain medicine, and is to follow-up as needed.  Final Clinical Impressions(s) / ED Diagnoses   Final diagnoses:  None    ED Discharge Orders    None       Veryl Speak, MD 08/21/18 [phone]

## 2018-08-21 NOTE — Discharge Instructions (Addendum)
Hydrocodone as prescribed as needed for pain.  Zofran as prescribed as needed for nausea.  Follow-up with your OB GYN next week if symptoms persist, and return to the ER if symptoms significantly worsen or change.

## 2018-09-25 ENCOUNTER — Encounter (HOSPITAL_COMMUNITY): Payer: Self-pay | Admitting: Emergency Medicine

## 2018-09-25 ENCOUNTER — Emergency Department (HOSPITAL_COMMUNITY)
Admission: EM | Admit: 2018-09-25 | Discharge: 2018-09-25 | Disposition: A | Discharge status: Home / Self Care | Payer: Medicaid Other | Attending: Emergency Medicine | Admitting: Emergency Medicine

## 2018-09-25 ENCOUNTER — Other Ambulatory Visit: Payer: Self-pay

## 2018-09-25 DIAGNOSIS — Z79899 Other long term (current) drug therapy: Secondary | ICD-10-CM | POA: Insufficient documentation

## 2018-09-25 DIAGNOSIS — L0501 Pilonidal cyst with abscess: Secondary | ICD-10-CM | POA: Insufficient documentation

## 2018-09-25 DIAGNOSIS — L0231 Cutaneous abscess of buttock: Secondary | ICD-10-CM | POA: Diagnosis present

## 2018-09-25 MED ORDER — SULFAMETHOXAZOLE-TRIMETHOPRIM 800-160 MG PO TABS: 1.0000 | ORAL_TABLET | Freq: Two times a day (BID) | ORAL | 0 refills | Status: DC

## 2018-09-25 MED ORDER — CEPHALEXIN 500 MG PO CAPS: 500.0000 mg | ORAL_CAPSULE | Freq: Four times a day (QID) | ORAL | 0 refills | Status: DC

## 2018-09-25 MED ORDER — NAPROXEN 375 MG PO TABS: 375.0000 mg | ORAL_TABLET | Freq: Two times a day (BID) | ORAL | 0 refills | Status: DC

## 2018-09-25 NOTE — Discharge Instructions (Addendum)
Continue warm wet compresses or warm tub baths. Follow up with the general surgeon. Return here as needed.

## 2018-09-25 NOTE — ED Triage Notes (Signed)
Pt reports possible boil or abscess on buttock area for 4 days. Repots yesterday had drainage coming out of it.

## 2018-09-25 NOTE — ED Provider Notes (Signed)
[facility] DEPT Provider Note   CSN: [id number] Arrival date & time: 09/25/18  1656     History   Chief Complaint Chief Complaint  Patient presents with  . Abscess    HPI Frances Mccormick is a 32 y.o. female who presents to the ED for an abscess to her buttock. She noted pain and swelling 4 days ago and has been applying warm wet compresses to the area and tub baths. Today the area drained a large amount.  HPI  Past Medical History:  Diagnosis Date  . Crohn's disease ([place])   . Migraine    "a few/year" (07/27/2018)  . Non-compliant patient     Patient Active Problem List   Diagnosis Date Noted  . Chronic hypertension during pregnancy, antepartum 08/11/2018  . Nausea & vomiting 07/26/2018  . Intractable vomiting with nausea 10/28/2014  . Medically noncompliant 10/26/2014  . Crohn's disease ([place]) 10/26/2014    Past Surgical History:  Procedure Laterality Date  . INDUCED ABORTION       OB History    Gravida  2   Para  0   Term      Preterm      AB  1   Living        SAB      TAB      Ectopic      Multiple      Live Births               Home Medications    Prior to Admission medications   Medication Sig Start Date End Date Taking? Authorizing Provider  acetaminophen (TYLENOL) 325 MG tablet Take 325 mg by mouth every 6 (six) hours as needed for mild pain.    [provider]  cephALEXin (KEFLEX) 500 MG capsule Take 1 capsule (500 mg total) by mouth 4 (four) times daily. 09/25/18   Ashley Murrain, NP  HYDROcodone-acetaminophen (NORCO) 5-325 MG tablet Take 1-2 tablets by mouth every 6 (six) hours as needed. 08/21/18   Veryl Speak, MD  labetalol (NORMODYNE) 200 MG tablet Take 1 tablet (200 mg total) by mouth 2 (two) times daily. 08/11/18   Leftwich-Kirby, Kathie Dike, CNM  metoCLOPramide (REGLAN) 10 MG tablet Take 1 tablet (10 mg total) by mouth 3 (three) times daily before meals. 08/11/18   Leftwich-Kirby, Kathie Dike,  CNM  naproxen (NAPROSYN) 375 MG tablet Take 1 tablet (375 mg total) by mouth 2 (two) times daily. 09/25/18   Ashley Murrain, NP  ondansetron (ZOFRAN ODT) 8 MG disintegrating tablet 37m ODT q4 hours prn nausea 08/21/18   DVeryl Speak MD  prenatal vitamin w/FE, FA (NATACHEW) 29-1 MG CHEW chewable tablet Chew 1 tablet by mouth daily at 12 noon. 07/27/18   Ghimire, SHenreitta Leber MD  promethazine (PHENERGAN) 25 MG suppository Place 1 suppository (25 mg total) rectally every 6 (six) hours as needed for nausea or vomiting. 08/12/18   Caccavale, Sophia, PA-C  promethazine (PHENERGAN) 25 MG tablet Take 0.5-1 tablets (12.5-25 mg total) by mouth at bedtime as needed for nausea. 08/11/18   Leftwich-Kirby, LKathie Dike CNM  sulfamethoxazole-trimethoprim (BACTRIM DS,SEPTRA DS) 800-160 MG tablet Take 1 tablet by mouth 2 (two) times daily for 7 days. 09/25/18 10/02/18  NAshley Murrain NP    Family History Family History  Problem Relation Age of Onset  . Diabetes Mother   . Diabetes Father     Social History Social History   Tobacco Use  . Smoking status:  Never Smoker  . Smokeless tobacco: Never Used  Substance Use Topics  . Alcohol use: Not Currently    Alcohol/week: 2.0 standard drinks    Types: 1 Glasses of wine, 1 Cans of beer per week  . Drug use: No     Allergies   Bee venom and Ciprofloxacin   Review of Systems Review of Systems  Skin: Positive for wound.  All other systems reviewed and are negative.    Physical Exam Updated Vital Signs BP 124/84 (BP Location: Right Arm)   Pulse 78   Temp 98.1 F (36.7 C) (Oral)   Resp 18   Ht 5' (1.524 m)   Wt 87.3 kg   LMP 06/27/2018   SpO2 100%   Breastfeeding? Unknown   BMI 37.60 kg/m   Physical Exam  Constitutional: She appears well-developed and well-nourished. No distress.  HENT:  Head: Normocephalic.  Eyes: EOM are normal.  Neck: Neck supple.  Cardiovascular: Normal rate.  Pulmonary/Chest: Effort normal.  Musculoskeletal: Normal range  of motion.  Neurological: She is alert.  Skin:     Pilonidal cyst that is draining. No red streaking  Psychiatric: She has a normal mood and affect.  Nursing note and vitals reviewed.    ED Treatments / Results  Labs (all labs ordered are listed, but only abnormal results are displayed) Labs Reviewed - No data to display Radiology No results found.  Procedures Procedures (including critical care time)  Medications Ordered in ED Medications - No data to display   Initial Impression / Assessment and Plan / ED Course  I have reviewed the triage vital signs and the nursing notes. 32 y.o. female here with abscess on her buttock that is draining. Patient stable for d/c without fever and does not appear toxic. Patient to continue warm wet compresses and will start antibiotics. Referral to surgery. Patient agrees with plan.   Final Clinical Impressions(s) / ED Diagnoses   Final diagnoses:  Pilonidal abscess    ED Discharge Orders         Ordered    sulfamethoxazole-trimethoprim (BACTRIM DS,SEPTRA DS) 800-160 MG tablet  2 times daily     09/25/18 1808    cephALEXin (KEFLEX) 500 MG capsule  4 times daily     09/25/18 1808    naproxen (NAPROSYN) 375 MG tablet  2 times daily     09/25/18 1809           Debroah Baller [place], [place] 09/25/18 1818    Gareth Morgan, MD 09/27/18 [phone]

## 2018-09-27 ENCOUNTER — Emergency Department (HOSPITAL_COMMUNITY)
Admission: EM | Admit: 2018-09-27 | Discharge: 2018-09-28 | Disposition: A | Discharge status: Home / Self Care | Payer: Medicaid Other | Attending: Emergency Medicine | Admitting: Emergency Medicine

## 2018-09-27 ENCOUNTER — Encounter (HOSPITAL_COMMUNITY): Payer: Self-pay | Admitting: Emergency Medicine

## 2018-09-27 DIAGNOSIS — R112 Nausea with vomiting, unspecified: Secondary | ICD-10-CM | POA: Insufficient documentation

## 2018-09-27 DIAGNOSIS — R1084 Generalized abdominal pain: Secondary | ICD-10-CM

## 2018-09-27 DIAGNOSIS — K5090 Crohn's disease, unspecified, without complications: Secondary | ICD-10-CM | POA: Insufficient documentation

## 2018-09-27 DIAGNOSIS — L0501 Pilonidal cyst with abscess: Secondary | ICD-10-CM | POA: Diagnosis not present

## 2018-09-27 LAB — URINALYSIS, ROUTINE W REFLEX MICROSCOPIC
BILIRUBIN URINE: NEGATIVE
GLUCOSE, UA: NEGATIVE mg/dL
Ketones, ur: 80 mg/dL — AB
LEUKOCYTES UA: NEGATIVE
NITRITE: NEGATIVE
PH: 7 (ref 5.0–8.0)
Protein, ur: 30 mg/dL — AB
SPECIFIC GRAVITY, URINE: 1.013 (ref 1.005–1.030)

## 2018-09-27 LAB — CBC
HEMATOCRIT: 36.6 % (ref 36.0–46.0)
Hemoglobin: 11.5 g/dL — ABNORMAL LOW (ref 12.0–15.0)
MCH: 24.3 pg — ABNORMAL LOW (ref 26.0–34.0)
MCHC: 31.4 g/dL (ref 30.0–36.0)
MCV: 77.4 fL — ABNORMAL LOW (ref 80.0–100.0)
NRBC: 0 % (ref 0.0–0.2)
Platelets: 560 10*3/uL — ABNORMAL HIGH (ref 150–400)
RBC: 4.73 MIL/uL (ref 3.87–5.11)
RDW: 15.2 % (ref 11.5–15.5)
WBC: 19.2 10*3/uL — AB (ref 4.0–10.5)

## 2018-09-27 LAB — I-STAT BETA HCG BLOOD, ED (MC, WL, AP ONLY): I-stat hCG, quantitative: <5 m[IU]/mL (ref <5)

## 2018-09-27 LAB — COMPREHENSIVE METABOLIC PANEL
ALT: 17 U/L (ref 0–44)
AST: 26 U/L (ref 15–41)
Albumin: 4.2 g/dL (ref 3.5–5.0)
Alkaline Phosphatase: 68 U/L (ref 38–126)
Anion gap: 11 (ref 5–15)
BUN: 7 mg/dL (ref 6–20)
CALCIUM: 8.9 mg/dL (ref 8.9–10.3)
CHLORIDE: 102 mmol/L (ref 98–111)
CO2: 24 mmol/L (ref 22–32)
CREATININE: 0.83 mg/dL (ref 0.44–1.00)
GFR calc Af Amer: >60 mL/min (ref >60)
GFR calc non Af Amer: >60 mL/min (ref >60)
Glucose, Bld: 119 mg/dL — ABNORMAL HIGH (ref 70–99)
Potassium: 3.7 mmol/L (ref 3.5–5.1)
Sodium: 137 mmol/L (ref 135–145)
Total Bilirubin: 0.4 mg/dL (ref 0.3–1.2)
Total Protein: 9 g/dL — ABNORMAL HIGH (ref 6.5–8.1)

## 2018-09-27 LAB — LIPASE, BLOOD: LIPASE: 26 U/L (ref 11–51)

## 2018-09-27 MED ORDER — SODIUM CHLORIDE 0.9 % IV BOLUS
1000.0000 mL | Freq: Once | INTRAVENOUS | Status: AC
  Administered 2018-09-27: 1000 mL via INTRAVENOUS

## 2018-09-27 MED ORDER — ONDANSETRON 4 MG PO TBDP
4.0000 mg | ORAL_TABLET | Freq: Once | ORAL | Status: AC | PRN
  Administered 2018-09-27: 4 mg via ORAL
  Filled 2018-09-27: qty 1

## 2018-09-27 MED ORDER — HYDROCODONE-ACETAMINOPHEN 5-325 MG PO TABS
1.0000 | ORAL_TABLET | Freq: Once | ORAL | Status: AC
  Administered 2018-09-28: 1 via ORAL
  Filled 2018-09-27: qty 1

## 2018-09-27 MED ORDER — METOCLOPRAMIDE HCL 5 MG/ML IJ SOLN
10.0000 mg | INTRAMUSCULAR | Status: AC
  Administered 2018-09-27: 10 mg via INTRAVENOUS
  Filled 2018-09-27: qty 2

## 2018-09-27 MED ORDER — LIDOCAINE-EPINEPHRINE 1 %-1:100000 IJ SOLN
10.0000 mL | Freq: Once | INTRAMUSCULAR | Status: DC
  Filled 2018-09-27: qty 10

## 2018-09-27 MED ORDER — PROMETHAZINE HCL 25 MG/ML IJ SOLN
12.5000 mg | Freq: Once | INTRAMUSCULAR | Status: AC
  Administered 2018-09-27: 12.5 mg via INTRAVENOUS
  Filled 2018-09-27: qty 1

## 2018-09-27 MED ORDER — FENTANYL CITRATE (PF) 100 MCG/2ML IJ SOLN
50.0000 ug | Freq: Once | INTRAMUSCULAR | Status: AC
  Administered 2018-09-27: 50 ug via INTRAVENOUS
  Filled 2018-09-27: qty 2

## 2018-09-27 MED ORDER — LIDOCAINE-EPINEPHRINE (PF) 2 %-1:200000 IJ SOLN
INTRAMUSCULAR | Status: AC
  Administered 2018-09-27: 22:00:00
  Filled 2018-09-27: qty 20

## 2018-09-27 NOTE — ED Provider Notes (Signed)
[facility] DEPT Provider Note   CSN: [id number] Arrival date & time: 09/27/18  1453     History   Chief Complaint Chief Complaint  Patient presents with  . Abdominal Pain  . Nausea  . Emesis    HPI Frances Mccormick is a 32 y.o. female.  31 year old female with past medical history including Crohn's disease not currently on any medications, hypertension during pregnancy, migraines who presents with nausea, vomiting, and abdominal pain.  Yesterday patient presented here with an abscess and was diagnosed with pilonidal cyst.  She was started on antibiotics and has taken 2 doses.  She thinks the antibiotics may be making her sick.  Around 830 this morning, she began having nausea and vomiting associated with generalized abdominal pain.  She has not been able to keep anything down.  She reports history of Crohn's but does not currently take any medications or follow with a gastroenterologist due to lack of insurance.  She had nonbloody diarrhea initially but this has stopped, but the vomiting has continued.  No urinary symptoms.  She is currently menstruating.  She denies any sick contacts.  The history is provided by the patient.  Abdominal Pain   Associated symptoms include vomiting.  Emesis   Associated symptoms include abdominal pain.    Past Medical History:  Diagnosis Date  . Crohn's disease ([place])   . Migraine    "a few/year" (07/27/2018)  . Non-compliant patient     Patient Active Problem List   Diagnosis Date Noted  . Chronic hypertension during pregnancy, antepartum 08/11/2018  . Nausea & vomiting 07/26/2018  . Intractable vomiting with nausea 10/28/2014  . Medically noncompliant 10/26/2014  . Crohn's disease ([place]) 10/26/2014    Past Surgical History:  Procedure Laterality Date  . INDUCED ABORTION       OB History    Gravida  2   Para  0   Term      Preterm      AB  1   Living        SAB      TAB      Ectopic     Multiple      Live Births               Home Medications    Prior to Admission medications   Medication Sig Start Date End Date Taking? Authorizing Provider  acetaminophen (TYLENOL) 325 MG tablet Take 325 mg by mouth every 6 (six) hours as needed for mild pain.   Yes [provider]  cephALEXin (KEFLEX) 500 MG capsule Take 1 capsule (500 mg total) by mouth 4 (four) times daily. 09/25/18  Yes Neese, Hope M, NP  ondansetron (ZOFRAN ODT) 8 MG disintegrating tablet 77m ODT q4 hours prn nausea 08/21/18  Yes Delo, DNathaneil Canary MD  promethazine (PHENERGAN) 25 MG suppository Place 1 suppository (25 mg total) rectally every 6 (six) hours as needed for nausea or vomiting. 08/12/18  Yes Caccavale, Sophia, PA-C  promethazine (PHENERGAN) 25 MG tablet Take 0.5-1 tablets (12.5-25 mg total) by mouth at bedtime as needed for nausea. 08/11/18  Yes Leftwich-Kirby, LKathie Dike CNM  sulfamethoxazole-trimethoprim (BACTRIM DS,SEPTRA DS) 800-160 MG tablet Take 1 tablet by mouth 2 (two) times daily for 7 days. 09/25/18 10/02/18 Yes Neese, HEdgewood NP  HYDROcodone-acetaminophen (NORCO) 5-325 MG tablet Take 1-2 tablets by mouth every 6 (six) hours as needed. Patient taking differently: Take 1-2 tablets by mouth every 6 (six) hours as needed for  moderate pain or severe pain.  08/21/18   Veryl Speak, MD  labetalol (NORMODYNE) 200 MG tablet Take 1 tablet (200 mg total) by mouth 2 (two) times daily. Patient not taking: Reported on 09/27/2018 08/11/18   Leftwich-Kirby, Kathie Dike, CNM  metoCLOPramide (REGLAN) 10 MG tablet Take 1 tablet (10 mg total) by mouth 3 (three) times daily before meals. Patient not taking: Reported on 09/27/2018 08/11/18   Leftwich-Kirby, Kathie Dike, CNM  naproxen (NAPROSYN) 375 MG tablet Take 1 tablet (375 mg total) by mouth 2 (two) times daily. 09/25/18   Ashley Murrain, NP  prenatal vitamin w/FE, FA (NATACHEW) 29-1 MG CHEW chewable tablet Chew 1 tablet by mouth daily at 12 noon. Patient not taking:  Reported on 09/27/2018 07/27/18   Jonetta Osgood, MD    Family History Family History  Problem Relation Age of Onset  . Diabetes Mother   . Diabetes Father     Social History Social History   Tobacco Use  . Smoking status: Never Smoker  . Smokeless tobacco: Never Used  Substance Use Topics  . Alcohol use: Not Currently    Alcohol/week: 2.0 standard drinks    Types: 1 Glasses of wine, 1 Cans of beer per week  . Drug use: No     Allergies   Bee venom and Ciprofloxacin   Review of Systems Review of Systems  Gastrointestinal: Positive for abdominal pain and vomiting.   All other systems reviewed and are negative except that which was mentioned in HPI   Physical Exam Updated Vital Signs BP (!) 174/105   Pulse 82   Temp 98 F (36.7 C) (Oral)   Resp 18   LMP 06/27/2018   SpO2 100%   Physical Exam  Constitutional: She is oriented to person, place, and time. She appears well-developed and well-nourished. No distress.  Uncomfortable, spitting into emesis bag  HENT:  Head: Normocephalic and atraumatic.  Moist mucous membranes  Eyes: Pupils are equal, round, and reactive to light. Conjunctivae are normal.  Neck: Neck supple.  Cardiovascular: Normal rate, regular rhythm and normal heart sounds.  No murmur heard. Pulmonary/Chest: Effort normal and breath sounds normal.  Abdominal: Soft. Bowel sounds are normal. She exhibits no distension. There is no tenderness.  Genitourinary:  Genitourinary Comments: Induration, tenderness top of gluteal cleft  Musculoskeletal: She exhibits no edema.  Neurological: She is alert and oriented to person, place, and time.  Fluent speech  Skin: Skin is warm and dry.  Psychiatric: She has a normal mood and affect. Judgment normal.  Nursing note and vitals reviewed.    ED Treatments / Results  Labs (all labs ordered are listed, but only abnormal results are displayed) Labs Reviewed  COMPREHENSIVE METABOLIC PANEL - Abnormal;  Notable for the following components:      Result Value   Glucose, Bld 119 (*)    Total Protein 9.0 (*)    All other components within normal limits  CBC - Abnormal; Notable for the following components:   WBC 19.2 (*)    Hemoglobin 11.5 (*)    MCV 77.4 (*)    MCH 24.3 (*)    Platelets 560 (*)    All other components within normal limits  URINALYSIS, ROUTINE W REFLEX MICROSCOPIC - Abnormal; Notable for the following components:   Color, Urine STRAW (*)    Hgb urine dipstick MODERATE (*)    Ketones, ur 80 (*)    Protein, ur 30 (*)    Bacteria, UA RARE (*)  All other components within normal limits  LIPASE, BLOOD  I-STAT BETA HCG BLOOD, ED ([facility], [facility], [facility] ONLY)    EKG None  Radiology No results found.  Procedures .Marland KitchenIncision and Drainage Date/Time: 09/28/2018 12:16 AM Performed by: Sharlett Iles, MD Authorized by: Sharlett Iles, MD   Consent:    Consent obtained:  Verbal   Consent given by:  Patient   Risks discussed:  Bleeding, incomplete drainage and pain Location:    Type:  Pilonidal cyst   Size:  3 cm   Location:  Anogenital   Anogenital location:  Pilonidal Pre-procedure details:    Skin preparation:  Betadine Anesthesia (see MAR for exact dosages):    Anesthesia method:  Local infiltration   Local anesthetic:  Lidocaine 2% WITH epi Procedure type:    Complexity:  Complex Procedure details:    Incision types:  Single straight   Incision depth:  Subcutaneous   Scalpel blade:  11   Wound management:  Probed and deloculated and irrigated with saline   Drainage:  Bloody   Drainage amount:  Scant   Wound treatment:  Wound left open   Packing materials:  1/2 in iodoform gauze   Amount 1/2" iodoform:  6 cm Post-procedure details:    Patient tolerance of procedure:  Tolerated well, no immediate complications   (including critical care time)  Medications Ordered in ED Medications  HYDROcodone-acetaminophen (NORCO/VICODIN) 5-325 MG per tablet  1 tablet (has no administration in time range)  ondansetron (ZOFRAN-ODT) disintegrating tablet 4 mg (4 mg Oral Given 09/27/18 1650)  metoCLOPramide (REGLAN) injection 10 mg (10 mg Intravenous Given 09/27/18 1903)  sodium chloride 0.9 % bolus 1,000 mL (0 mLs Intravenous Stopped 09/27/18 2204)  fentaNYL (SUBLIMAZE) injection 50 mcg (50 mcg Intravenous Given 09/27/18 1905)  lidocaine-EPINEPHrine (XYLOCAINE W/EPI) 2 %-1:200000 (PF) injection (  Given by Other 09/27/18 2203)  promethazine (PHENERGAN) injection 12.5 mg (12.5 mg Intravenous Given 09/27/18 2300)     Initial Impression / Assessment and Plan / ED Course  I have reviewed the triage vital signs and the nursing notes.  Pertinent labs & imaging results that were available during my care of the patient were reviewed by me and considered in my medical decision making (see chart for details).    Non-toxic on exam, reassuring vital signs.  No focal abdominal tenderness for me.  Work shows reassuring CMP and lipase.  WBC 19.2 however not unexpected in the setting of known abscess.  Furthermore, she always has mild leukocytosis on previous labs.  Her hemoglobin is 11.5 today, was higher previously but she denies any black stools, bloody stools, NSAID use, or anticoagulant use and I feel that GI bleed is unlikely.  Currently menstruating which may be reason for mild drop.  Gave above medications and IV fluids.  I did recommend incision and drainage of pilonidal abscess given ongoing pain and physical exam.  See procedure note for details.  Recommended continuing antibiotics she was discharged with yesterday.  Patient has been tolerating ice chips here.  Will provide with nausea medication to use as needed at home.  Provided with pain medication regarding her pilonidal abscess and reiterated the importance of follow-up with general surgery.  Also recommended following up with GI as soon as possible regarding ongoing Crohn's management.  Given no focal  abdominal tenderness today, I do not feel she would benefit from a CT scan.  I have reviewed return precautions with the patient and her mom and reviewed wound care instructions.  They voiced understanding.  In order to minimize risk of narcotic abuse, the patient's record was checked using the [place] controlled substance database and no discrepancies were identified for the month of October. Final Clinical Impressions(s) / ED Diagnoses   Final diagnoses:  None    ED Discharge Orders    None       Lanelle Lindo, Wenda Overland, MD 09/28/18 [phone]

## 2018-09-27 NOTE — ED Notes (Signed)
Pt given ice per MD request

## 2018-09-27 NOTE — ED Notes (Signed)
I&D materials at bedside. MD notified

## 2018-09-27 NOTE — ED Notes (Signed)
Pt states she threw up ODT zofran

## 2018-09-27 NOTE — ED Triage Notes (Addendum)
Patient here from home with complaints of nausea, vomiting, abd pain. Reports that she was seen on 10/26 for abscess. Denies pregnancy. Zofran with no relief.

## 2018-09-27 NOTE — ED Notes (Signed)
Patient requesting nausea meds before lab draw. RN made aware.

## 2018-09-28 MED ORDER — HYDROCODONE-ACETAMINOPHEN 5-325 MG PO TABS: 1.0000 | ORAL_TABLET | Freq: Four times a day (QID) | ORAL | 0 refills | Status: DC | PRN

## 2018-09-28 MED ORDER — ONDANSETRON 4 MG PO TBDP: 4.0000 mg | ORAL_TABLET | Freq: Three times a day (TID) | ORAL | 0 refills | Status: DC | PRN

## 2018-09-29 ENCOUNTER — Other Ambulatory Visit: Payer: Self-pay

## 2018-09-29 ENCOUNTER — Emergency Department (HOSPITAL_COMMUNITY)
Admission: EM | Admit: 2018-09-29 | Discharge: 2018-09-29 | Disposition: A | Discharge status: Home / Self Care | Payer: Medicaid Other | Attending: Emergency Medicine | Admitting: Emergency Medicine

## 2018-09-29 ENCOUNTER — Emergency Department (HOSPITAL_COMMUNITY): Payer: Medicaid Other

## 2018-09-29 DIAGNOSIS — E876 Hypokalemia: Secondary | ICD-10-CM | POA: Insufficient documentation

## 2018-09-29 DIAGNOSIS — R1031 Right lower quadrant pain: Secondary | ICD-10-CM | POA: Insufficient documentation

## 2018-09-29 DIAGNOSIS — R112 Nausea with vomiting, unspecified: Secondary | ICD-10-CM

## 2018-09-29 DIAGNOSIS — R1030 Lower abdominal pain, unspecified: Secondary | ICD-10-CM

## 2018-09-29 LAB — COMPREHENSIVE METABOLIC PANEL
ALK PHOS: 61 U/L (ref 38–126)
ALT: 14 U/L (ref 0–44)
AST: 17 U/L (ref 15–41)
Albumin: 4.1 g/dL (ref 3.5–5.0)
Anion gap: 12 (ref 5–15)
BILIRUBIN TOTAL: 0.6 mg/dL (ref 0.3–1.2)
BUN: 16 mg/dL (ref 6–20)
CALCIUM: 9.1 mg/dL (ref 8.9–10.3)
CO2: 24 mmol/L (ref 22–32)
CREATININE: 0.91 mg/dL (ref 0.44–1.00)
Chloride: 100 mmol/L (ref 98–111)
GFR calc Af Amer: >60 mL/min (ref >60)
Glucose, Bld: 98 mg/dL (ref 70–99)
POTASSIUM: 2.9 mmol/L — AB (ref 3.5–5.1)
Sodium: 136 mmol/L (ref 135–145)
TOTAL PROTEIN: 8.6 g/dL — AB (ref 6.5–8.1)

## 2018-09-29 LAB — LIPASE, BLOOD: LIPASE: 76 U/L — AB (ref 11–51)

## 2018-09-29 LAB — URINALYSIS, ROUTINE W REFLEX MICROSCOPIC
Bilirubin Urine: NEGATIVE
GLUCOSE, UA: 50 mg/dL — AB
KETONES UR: 80 mg/dL — AB
NITRITE: NEGATIVE
PH: 6 (ref 5.0–8.0)
Protein, ur: 100 mg/dL — AB
Specific Gravity, Urine: 1.026 (ref 1.005–1.030)
Squamous Epithelial / LPF: >50 — ABNORMAL HIGH (ref 0–5)

## 2018-09-29 LAB — CBC WITH DIFFERENTIAL/PLATELET
ABS IMMATURE GRANULOCYTES: 0.18 10*3/uL — AB (ref 0.00–0.07)
BASOS PCT: 0 %
Basophils Absolute: 0.1 10*3/uL (ref 0.0–0.1)
EOS ABS: 0.1 10*3/uL (ref 0.0–0.5)
EOS PCT: 0 %
HEMATOCRIT: 41.9 % (ref 36.0–46.0)
Hemoglobin: 13.2 g/dL (ref 12.0–15.0)
Immature Granulocytes: 1 %
Lymphocytes Relative: 10 %
Lymphs Abs: 2 10*3/uL (ref 0.7–4.0)
MCH: 24.1 pg — ABNORMAL LOW (ref 26.0–34.0)
MCHC: 31.5 g/dL (ref 30.0–36.0)
MCV: 76.6 fL — AB (ref 80.0–100.0)
Monocytes Absolute: 0.9 10*3/uL (ref 0.1–1.0)
Monocytes Relative: 5 %
Neutro Abs: 16.6 10*3/uL — ABNORMAL HIGH (ref 1.7–7.7)
Neutrophils Relative %: 84 %
PLATELETS: 572 10*3/uL — AB (ref 150–400)
RBC: 5.47 MIL/uL — AB (ref 3.87–5.11)
RDW: 15.5 % (ref 11.5–15.5)
WBC: 19.9 10*3/uL — AB (ref 4.0–10.5)
nRBC: 0 % (ref 0.0–0.2)

## 2018-09-29 LAB — WET PREP, GENITAL
CLUE CELLS WET PREP: NONE SEEN
Sperm: NONE SEEN
TRICH WET PREP: NONE SEEN
Yeast Wet Prep HPF POC: NONE SEEN

## 2018-09-29 LAB — I-STAT BETA HCG BLOOD, ED (MC, WL, AP ONLY): I-stat hCG, quantitative: <5 m[IU]/mL (ref <5)

## 2018-09-29 MED ORDER — POTASSIUM CHLORIDE CRYS ER 20 MEQ PO TBCR: 40.0000 meq | EXTENDED_RELEASE_TABLET | Freq: Every day | ORAL | 0 refills | Status: DC

## 2018-09-29 MED ORDER — METOCLOPRAMIDE HCL 5 MG/ML IJ SOLN
10.0000 mg | Freq: Once | INTRAMUSCULAR | Status: AC
  Administered 2018-09-29: 10 mg via INTRAVENOUS
  Filled 2018-09-29: qty 2

## 2018-09-29 MED ORDER — MORPHINE SULFATE (PF) 4 MG/ML IV SOLN
4.0000 mg | Freq: Once | INTRAVENOUS | Status: AC
  Administered 2018-09-29: 4 mg via INTRAVENOUS
  Filled 2018-09-29: qty 1

## 2018-09-29 MED ORDER — METOCLOPRAMIDE HCL 10 MG PO TABS: 10.0000 mg | ORAL_TABLET | Freq: Three times a day (TID) | ORAL | 0 refills | Status: DC | PRN

## 2018-09-29 MED ORDER — SODIUM CHLORIDE 0.9 % IV BOLUS
1000.0000 mL | Freq: Once | INTRAVENOUS | Status: AC
  Administered 2018-09-29: 1000 mL via INTRAVENOUS

## 2018-09-29 MED ORDER — POTASSIUM CHLORIDE CRYS ER 20 MEQ PO TBCR
40.0000 meq | EXTENDED_RELEASE_TABLET | Freq: Once | ORAL | Status: AC
  Administered 2018-09-29: 40 meq via ORAL
  Filled 2018-09-29: qty 2

## 2018-09-29 NOTE — ED Provider Notes (Signed)
Emergency Department Provider Note   I have reviewed the triage vital signs and the nursing notes.   HISTORY  Chief Complaint Emesis   HPI Frances Mccormick is a 32 y.o. female with PMH of Crohn's disease, migraine HA, and intractable nausea and vomiting presents to the emergency department for evaluation of worsening abdominal pain with nausea and vomiting.  The patient was seen in the emergency department 2 days prior with similar symptoms and associated pilonidal abscess which was drained.  Patient states that she is been unable to take any medications including the antibiotics that she was prescribed.  She denies any fevers or shaking chills.  She denies any diarrhea and states she is actually not had a significant bowel movement in the past 2 days.  Also reports significantly decreased urine output.  She has seen some specks of blood in her emesis.  No chest pain or shortness of breath.  She states this feels similar to her Crohn's flares.  She is not on medication for Crohn's after losing her insurance 3 years ago.  She has not seen gastroenterology since that time.  She was found to be pregnant in September of this year and underwent a medical abortion.  She states she is currently on her menstrual cycle.   Past Medical History:  Diagnosis Date  . Crohn's disease ([place])   . Migraine    "a few/year" (07/27/2018)  . Non-compliant patient     Patient Active Problem List   Diagnosis Date Noted  . Chronic hypertension during pregnancy, antepartum 08/11/2018  . Nausea & vomiting 07/26/2018  . Intractable vomiting with nausea 10/28/2014  . Medically noncompliant 10/26/2014  . Crohn's disease ([place]) 10/26/2014    Past Surgical History:  Procedure Laterality Date  . INDUCED ABORTION      Allergies Bee venom and Ciprofloxacin  Family History  Problem Relation Age of Onset  . Diabetes Mother   . Diabetes Father     Social History Social History   Tobacco Use  . Smoking  status: Never Smoker  . Smokeless tobacco: Never Used  Substance Use Topics  . Alcohol use: Not Currently    Alcohol/week: 2.0 standard drinks    Types: 1 Glasses of wine, 1 Cans of beer per week  . Drug use: No    Review of Systems  Constitutional: No fever/chills Eyes: No visual changes. ENT: No sore throat. Cardiovascular: Denies chest pain. Respiratory: Denies shortness of breath. Gastrointestinal: Positive RLQ abdominal pain. Positive nausea and vomiting.  No diarrhea. Positive constipation. Genitourinary: Negative for dysuria. Musculoskeletal: Negative for back pain. Skin: Negative for rash. Neurological: Negative for headaches, focal weakness or numbness.  10-point ROS otherwise negative.  ____________________________________________   PHYSICAL EXAM:  VITAL SIGNS: ED Triage Vitals [09/29/18 0921]  Enc Vitals Group     BP (!) 158/110     Pulse Rate 95     Resp 17     Temp 98.7 F (37.1 C)     Temp src      SpO2 100 %     Pain Score 10   Constitutional: Alert and oriented. Well appearing and in no acute distress. Eyes: Conjunctivae are normal.  Head: Atraumatic. Nose: No congestion/rhinnorhea. Mouth/Throat: Mucous membranes are moist.  Oropharynx non-erythematous. Neck: No stridor.   Cardiovascular: Normal rate, regular rhythm. Good peripheral circulation. Grossly normal heart sounds.   Respiratory: Normal respiratory effort.  No retractions. Lungs CTAB. Gastrointestinal: Soft with mild diffuse tenderness and focal tenderness in the  RLQ. No rebound or guarding. No distention.  GU: Patient with mild dark red blood in vaginal vault. No active BRB. No CMT or adnexal tenderness. No concern clinically for PID.  Musculoskeletal: No lower extremity tenderness nor edema. No gross deformities of extremities. Neurologic:  Normal speech and language. No gross focal neurologic deficits are appreciated.  Skin:  Skin is warm, dry and intact. No rash noted. Pilonidal  abscess s/p drainage is well-appearing without surrounding cellulitis, induration, or fluctuance. Packing is in place.   ____________________________________________   LABS (all labs ordered are listed, but only abnormal results are displayed)  Labs Reviewed  WET PREP, GENITAL - Abnormal; Notable for the following components:      Result Value   WBC, Wet Prep HPF POC FEW (*)    All other components within normal limits  CBC WITH DIFFERENTIAL/PLATELET - Abnormal; Notable for the following components:   WBC 19.9 (*)    RBC 5.47 (*)    MCV 76.6 (*)    MCH 24.1 (*)    Platelets 572 (*)    Neutro Abs 16.6 (*)    Abs Immature Granulocytes 0.18 (*)    All other components within normal limits  URINALYSIS, ROUTINE W REFLEX MICROSCOPIC - Abnormal; Notable for the following components:   Color, Urine AMBER (*)    APPearance CLOUDY (*)    Glucose, UA 50 (*)    Hgb urine dipstick LARGE (*)    Ketones, ur 80 (*)    Protein, ur 100 (*)    Leukocytes, UA TRACE (*)    RBC / HPF >50 (*)    WBC, UA >50 (*)    Bacteria, UA RARE (*)    Squamous Epithelial / LPF >50 (*)    Non Squamous Epithelial 0-5 (*)    All other components within normal limits  COMPREHENSIVE METABOLIC PANEL - Abnormal; Notable for the following components:   Potassium 2.9 (*)    Total Protein 8.6 (*)    All other components within normal limits  LIPASE, BLOOD - Abnormal; Notable for the following components:   Lipase 76 (*)    All other components within normal limits  URINE CULTURE  I-STAT BETA HCG BLOOD, ED ([facility], [facility], [facility] ONLY)  GC/CHLAMYDIA PROBE AMP ([place]) NOT AT [facility]   ____________________________________________  RADIOLOGY  Ct Abdomen Pelvis Wo Contrast  Result Date: 09/29/2018 CLINICAL DATA:  Patient was on antibiotics for pilonidal cyst but developed nausea, vomiting and lower abdominal cramping. Patient discontinued antibiotics and has persistent nausea, vomiting and abdominal cramping. EXAM: CT  ABDOMEN AND PELVIS WITHOUT CONTRAST TECHNIQUE: Multidetector CT imaging of the abdomen and pelvis was performed following the standard protocol without IV contrast. COMPARISON:  None. FINDINGS: LOWER CHEST: Lung bases are clear. Included heart size is normal. No pericardial effusion. HEPATOBILIARY: Liver and gallbladder are normal. PANCREAS: Normal. SPLEEN: Normal. ADRENALS/URINARY TRACT: Kidneys are orthotopic, demonstrating symmetric enhancement. No nephrolithiasis, hydronephrosis or solid renal masses. Urinary bladder is partially distended and unremarkable. Normal adrenal glands. STOMACH/BOWEL: The stomach, small and large bowel are normal in course and caliber without inflammatory changes. Normal appendix. VASCULAR/LYMPHATIC: Aortoiliac vessels are normal in course and caliber. No lymphadenopathy by CT size criteria. REPRODUCTIVE: Uterus and adnexa are unremarkable. OTHER: No intraperitoneal free fluid or free air. Minimal inflammatory change along the medial aspect of the left buttock along the natal cleft may represent site of pilonidal cyst. MUSCULOSKELETAL: Nonacute. IMPRESSION: 1. No acute intra-abdominal nor pelvic abnormality. 2. Mild inflammatory change along the  medial aspect of the left buttock along the natal cleft without abscess. Electronically Signed   By: Ashley Royalty M.D.   On: 09/29/2018 14:33    ____________________________________________   PROCEDURES  Procedure(s) performed:   Procedures  None ____________________________________________   INITIAL IMPRESSION / ASSESSMENT AND PLAN / ED COURSE  Pertinent labs & imaging results that were available during my care of the patient were reviewed by me and considered in my medical decision making (see chart for details).  With past medical history of untreated Crohn's disease presents with worsening lower abdominal pain and nausea/vomiting.  Afebrile here.  Pilonidal abscess drained 2 days ago is well-appearing with packing in  place.  No concern for reaccumulation of infection or surrounding cellulitis.  Plan for screening labs and bladder scan with patient's report of decreased urine output.  I do plan to repeat the CT scan for this patient given her history of untreated Crohn's disease and symptoms possibly concerning for either Crohn's flare or resulting small bowel obstruction.   5:17 PM Patient feeling improved after Reglan and IVF. CT negative for acute findings. Pelvic performed with recent STD diagnosis and not concerning for PID or TOA. Additional reglan given. Patient CMP and UA are pending. Patient does have medicaid. I had social work see the patient to confirm.   CMP resulted with potassium of 2.9. Patient feeling much better after IVF and Reglan. UA with no clear infection but does have blood as seen on pelvic exam. Will send for culture. Patient already on Bactrim and does not have any specific UTI symptoms. If tolerating PO and oral potassium the patient can be discharged with plan for PCP and GI follow up.   Care transferred to Dr. Darl Householder who will follow PO challenge. Patient is comfortable with discharge plan.  ____________________________________________  FINAL CLINICAL IMPRESSION(S) / ED DIAGNOSES  Final diagnoses:  Lower abdominal pain  Non-intractable vomiting with nausea, unspecified vomiting type  Hypokalemia     MEDICATIONS GIVEN DURING THIS VISIT:  Medications  sodium chloride 0.9 % bolus 1,000 mL (0 mLs Intravenous Stopped 09/29/18 1503)  metoCLOPramide (REGLAN) injection 10 mg (10 mg Intravenous Given 09/29/18 1114)  morphine 4 MG/ML injection 4 mg (4 mg Intravenous Given 09/29/18 1114)  metoCLOPramide (REGLAN) injection 10 mg (10 mg Intravenous Given 09/29/18 1503)  potassium chloride SA (K-DUR,KLOR-CON) CR tablet 40 mEq (40 mEq Oral Given 09/29/18 1607)     NEW OUTPATIENT MEDICATIONS STARTED DURING THIS VISIT:  Discharge Medication List as of 09/29/2018  4:35 PM    START taking  these medications   Details  potassium chloride SA (K-DUR,KLOR-CON) 20 MEQ tablet Take 2 tablets (40 mEq total) by mouth daily for 4 days., Starting Thu 09/30/2018, Until Mon 10/04/2018, Normal        Note:  This document was prepared using Dragon voice recognition software and may include unintentional dictation errors.  Nanda Quinton, MD Emergency Medicine    Jimmie Dattilio, Wonda Olds, MD 09/29/18 [phone]

## 2018-09-29 NOTE — ED Triage Notes (Signed)
Seen at this facility for same on Monday--n/v, dehydration and abdominal pain that has been ongoing since last visit. Symptoms unchanged from last visit and patient unable to take prescribed medication for nausea.

## 2018-09-29 NOTE — Care Management Note (Signed)
Case Management Note  CM consulted for assistance with possibly no ins, and no PCP with need for GI follow up.  CM spoke with pt and discussed the [facility], financial counseling, and pharmacy.  Information placed on AVS.  CM also sent a message to [facility] CM for possible appointment cancellation openings.  CM checked with an admissions associate who was able to verify that pt's medicaid coverage is e-verfied.  Updated Dr. Laverta Baltimore, pt and CSW.  No further CM needs noted at this time.  Frances Mccormick, Benjaman Lobe, RN 09/29/2018, 10:23 AM

## 2018-09-29 NOTE — ED Provider Notes (Signed)
  Physical Exam  BP (!) 150/97   Pulse 84   Temp 98.7 F (37.1 C)   Resp 18   LMP 06/27/2018   SpO2 100%   Physical Exam  ED Course/Procedures   Clinical Course as of Sep 29 1632  Wed Sep 29, 2018  1314 CT ABDOMEN PELVIS W CONTRAST [CH]    Clinical Course User Index [CH] Mahalia Longest, Student-PA    Procedures  MDM  Care assumed at 4 pm. Patient here with vomiting, ab pain. Hypokalemic to 2.9, CT ab/pel unremarkable. Signed out pending PO trial.   4:33 PM Tolerated PO potassium and PO fluids. Dr. Laverta Baltimore prescribed reglan, potassium. Stable for discharge      Drenda Freeze, MD 09/29/18 [phone]

## 2018-09-29 NOTE — Discharge Instructions (Addendum)
You have been seen in the Emergency Department (ED) for abdominal pain.  Your evaluation did not identify a clear cause of your symptoms but was generally reassuring.  You will need to have your potassium re-checked in 4-5 days to make sure your labs are normalizing. Call your PCP for this. Return to the ED if you continue to vomit or if you cannot keep down your medications.   Please follow up as instructed above regarding todays emergent visit and the symptoms that are bothering you.  Return to the ED if your abdominal pain worsens or fails to improve, you develop bloody vomiting, bloody diarrhea, you are unable to tolerate fluids due to vomiting, fever greater than 101, or other symptoms that concern you.

## 2018-09-29 NOTE — Progress Notes (Signed)
CSW aware of consult for Medicaid application assistance so patient can get connected with a GI specialist. Per RN CM, she would provide patient with resources for [place] to get established with a PCP and financial counseling. CSW spoke with patient regarding where patient was in the Medicaid process. Per patient, she had been trying to apply for Medicaid when she was pregnant and in the hospital, but wasn't sure if she had finished the process or if she still qualified since miscarrying. CSW advised patient to follow up with the Department of Social Services to restart or to continue her Medicaid application. While CSW was talking to patient, RN CM informed CSW and patient that her Medicaid had been verified. CSW still encouraged patient to follow up with DSS as they would be able to provide patient with a card and discuss benefits. CSW inquired about other resources patient may need. Patient and patient's mother declined needing anything else from [place]. No further CSW needs at this time, please reconsult if needs arise.  Ollen Barges, [place] Work Department  [employer]  [phone]

## 2018-09-30 ENCOUNTER — Other Ambulatory Visit: Payer: Self-pay

## 2018-09-30 ENCOUNTER — Inpatient Hospital Stay (HOSPITAL_COMMUNITY)
Admission: EM | Admit: 2018-09-30 | Discharge: 2018-10-03 | DRG: 392 | Disposition: A | Discharge status: Home / Self Care | Payer: Medicaid Other | Attending: Internal Medicine | Admitting: Internal Medicine

## 2018-09-30 ENCOUNTER — Encounter (HOSPITAL_COMMUNITY): Payer: Self-pay

## 2018-09-30 DIAGNOSIS — E860 Dehydration: Secondary | ICD-10-CM

## 2018-09-30 DIAGNOSIS — F1210 Cannabis abuse, uncomplicated: Secondary | ICD-10-CM | POA: Diagnosis present

## 2018-09-30 DIAGNOSIS — D72829 Elevated white blood cell count, unspecified: Secondary | ICD-10-CM | POA: Diagnosis present

## 2018-09-30 DIAGNOSIS — R112 Nausea with vomiting, unspecified: Secondary | ICD-10-CM | POA: Diagnosis not present

## 2018-09-30 DIAGNOSIS — L0501 Pilonidal cyst with abscess: Secondary | ICD-10-CM | POA: Diagnosis present

## 2018-09-30 DIAGNOSIS — E871 Hypo-osmolality and hyponatremia: Secondary | ICD-10-CM | POA: Diagnosis present

## 2018-09-30 DIAGNOSIS — I10 Essential (primary) hypertension: Secondary | ICD-10-CM | POA: Diagnosis present

## 2018-09-30 DIAGNOSIS — Z79899 Other long term (current) drug therapy: Secondary | ICD-10-CM

## 2018-09-30 DIAGNOSIS — K5090 Crohn's disease, unspecified, without complications: Secondary | ICD-10-CM | POA: Diagnosis present

## 2018-09-30 DIAGNOSIS — Z9119 Patient's noncompliance with other medical treatment and regimen: Secondary | ICD-10-CM

## 2018-09-30 DIAGNOSIS — K50919 Crohn's disease, unspecified, with unspecified complications: Secondary | ICD-10-CM

## 2018-09-30 DIAGNOSIS — I959 Hypotension, unspecified: Secondary | ICD-10-CM | POA: Diagnosis present

## 2018-09-30 DIAGNOSIS — F1110 Opioid abuse, uncomplicated: Secondary | ICD-10-CM | POA: Diagnosis present

## 2018-09-30 DIAGNOSIS — E876 Hypokalemia: Secondary | ICD-10-CM | POA: Diagnosis present

## 2018-09-30 LAB — URINALYSIS, ROUTINE W REFLEX MICROSCOPIC
Bilirubin Urine: NEGATIVE
GLUCOSE, UA: NEGATIVE mg/dL
KETONES UR: 80 mg/dL — AB
LEUKOCYTES UA: NEGATIVE
Nitrite: NEGATIVE
PH: 6 (ref 5.0–8.0)
Protein, ur: 100 mg/dL — AB
RBC / HPF: >50 RBC/hpf — ABNORMAL HIGH (ref 0–5)
Specific Gravity, Urine: 1.025 (ref 1.005–1.030)

## 2018-09-30 LAB — COMPREHENSIVE METABOLIC PANEL
ALT: 13 U/L (ref 0–44)
ANION GAP: 14 (ref 5–15)
AST: 19 U/L (ref 15–41)
Albumin: 4.4 g/dL (ref 3.5–5.0)
Alkaline Phosphatase: 60 U/L (ref 38–126)
BUN: 17 mg/dL (ref 6–20)
CHLORIDE: 100 mmol/L (ref 98–111)
CO2: 23 mmol/L (ref 22–32)
Calcium: 9.1 mg/dL (ref 8.9–10.3)
Creatinine, Ser: 0.94 mg/dL (ref 0.44–1.00)
GFR calc non Af Amer: >60 mL/min (ref >60)
GLUCOSE: 104 mg/dL — AB (ref 70–99)
POTASSIUM: 3 mmol/L — AB (ref 3.5–5.1)
Sodium: 137 mmol/L (ref 135–145)
Total Bilirubin: 1 mg/dL (ref 0.3–1.2)
Total Protein: 8.6 g/dL — ABNORMAL HIGH (ref 6.5–8.1)

## 2018-09-30 LAB — CBC WITH DIFFERENTIAL/PLATELET
ABS IMMATURE GRANULOCYTES: 0.19 10*3/uL — AB (ref 0.00–0.07)
BASOS PCT: 1 %
Basophils Absolute: 0.1 10*3/uL (ref 0.0–0.1)
EOS ABS: 0 10*3/uL (ref 0.0–0.5)
Eosinophils Relative: 0 %
HCT: 40.9 % (ref 36.0–46.0)
Hemoglobin: 13.2 g/dL (ref 12.0–15.0)
Immature Granulocytes: 1 %
Lymphocytes Relative: 14 %
Lymphs Abs: 2.9 10*3/uL (ref 0.7–4.0)
MCH: 24.3 pg — AB (ref 26.0–34.0)
MCHC: 32.3 g/dL (ref 30.0–36.0)
MCV: 75.2 fL — AB (ref 80.0–100.0)
MONOS PCT: 6 %
Monocytes Absolute: 1.2 10*3/uL — ABNORMAL HIGH (ref 0.1–1.0)
NEUTROS ABS: 16.1 10*3/uL — AB (ref 1.7–7.7)
Neutrophils Relative %: 78 %
PLATELETS: 661 10*3/uL — AB (ref 150–400)
RBC: 5.44 MIL/uL — ABNORMAL HIGH (ref 3.87–5.11)
RDW: 15.2 % (ref 11.5–15.5)
WBC: 20.6 10*3/uL — ABNORMAL HIGH (ref 4.0–10.5)
nRBC: 0 % (ref 0.0–0.2)

## 2018-09-30 LAB — GC/CHLAMYDIA PROBE AMP (~~LOC~~) NOT AT ARMC
CHLAMYDIA, DNA PROBE: NEGATIVE
NEISSERIA GONORRHEA: NEGATIVE

## 2018-09-30 LAB — MAGNESIUM: Magnesium: 2.4 mg/dL (ref 1.7–2.4)

## 2018-09-30 LAB — RAPID URINE DRUG SCREEN, HOSP PERFORMED
AMPHETAMINES: NOT DETECTED
BENZODIAZEPINES: NOT DETECTED
Barbiturates: NOT DETECTED
Cocaine: NOT DETECTED
OPIATES: POSITIVE — AB
TETRAHYDROCANNABINOL: POSITIVE — AB

## 2018-09-30 LAB — PROCALCITONIN

## 2018-09-30 LAB — PREGNANCY, URINE: Preg Test, Ur: NEGATIVE

## 2018-09-30 LAB — LIPASE, BLOOD: Lipase: 37 U/L (ref 11–51)

## 2018-09-30 MED ORDER — CEPHALEXIN 500 MG PO CAPS
500.0000 mg | ORAL_CAPSULE | Freq: Four times a day (QID) | ORAL | Status: DC
  Administered 2018-09-30 – 2018-10-03 (×11): 500 mg via ORAL
  Filled 2018-09-30 (×12): qty 1

## 2018-09-30 MED ORDER — ENOXAPARIN SODIUM 40 MG/0.4ML ~~LOC~~ SOLN
40.0000 mg | Freq: Every day | SUBCUTANEOUS | Status: DC
  Administered 2018-10-01 – 2018-10-03 (×3): 40 mg via SUBCUTANEOUS
  Filled 2018-09-30 (×3): qty 0.4

## 2018-09-30 MED ORDER — SODIUM CHLORIDE 0.9 % IV BOLUS
1500.0000 mL | Freq: Once | INTRAVENOUS | Status: AC
  Administered 2018-09-30: 1500 mL via INTRAVENOUS

## 2018-09-30 MED ORDER — PROMETHAZINE HCL 25 MG/ML IJ SOLN
25.0000 mg | Freq: Once | INTRAMUSCULAR | Status: AC
  Administered 2018-09-30: 25 mg via INTRAVENOUS
  Filled 2018-09-30: qty 1

## 2018-09-30 MED ORDER — ONDANSETRON HCL 4 MG/2ML IJ SOLN
4.0000 mg | Freq: Once | INTRAMUSCULAR | Status: AC
  Administered 2018-09-30: 4 mg via INTRAVENOUS
  Filled 2018-09-30: qty 2

## 2018-09-30 MED ORDER — ACETAMINOPHEN 325 MG PO TABS: 650.0000 mg | ORAL_TABLET | Freq: Four times a day (QID) | ORAL | Status: DC | PRN

## 2018-09-30 MED ORDER — POTASSIUM CHLORIDE 20 MEQ/15ML (10%) PO SOLN
40.0000 meq | Freq: Once | ORAL | Status: AC
  Administered 2018-09-30: 40 meq via ORAL
  Filled 2018-09-30: qty 30

## 2018-09-30 MED ORDER — MORPHINE SULFATE (PF) 4 MG/ML IV SOLN
4.0000 mg | Freq: Once | INTRAVENOUS | Status: AC
  Administered 2018-09-30: 4 mg via INTRAVENOUS
  Filled 2018-09-30: qty 1

## 2018-09-30 MED ORDER — HYDROCODONE-ACETAMINOPHEN 5-325 MG PO TABS
1.0000 | ORAL_TABLET | Freq: Four times a day (QID) | ORAL | Status: DC | PRN
  Administered 2018-10-01 (×2): 1 via ORAL
  Filled 2018-09-30 (×2): qty 1

## 2018-09-30 MED ORDER — ONDANSETRON HCL 4 MG/2ML IJ SOLN
4.0000 mg | Freq: Three times a day (TID) | INTRAMUSCULAR | Status: DC | PRN
  Administered 2018-10-01 – 2018-10-02 (×4): 4 mg via INTRAVENOUS
  Filled 2018-09-30 (×5): qty 2

## 2018-09-30 MED ORDER — HYDRALAZINE HCL 20 MG/ML IJ SOLN
5.0000 mg | INTRAMUSCULAR | Status: DC | PRN
  Filled 2018-09-30: qty 1

## 2018-09-30 MED ORDER — POTASSIUM CHLORIDE 10 MEQ/100ML IV SOLN
10.0000 meq | Freq: Once | INTRAVENOUS | Status: AC
  Administered 2018-09-30: 10 meq via INTRAVENOUS
  Filled 2018-09-30: qty 100

## 2018-09-30 MED ORDER — SODIUM CHLORIDE 0.9 % IV BOLUS: 1000.0000 mL | Freq: Once | INTRAVENOUS | Status: DC

## 2018-09-30 MED ORDER — ZOLPIDEM TARTRATE 5 MG PO TABS: 5.0000 mg | ORAL_TABLET | Freq: Every evening | ORAL | Status: DC | PRN

## 2018-09-30 MED ORDER — SODIUM CHLORIDE 0.9 % IV SOLN
INTRAVENOUS | Status: DC
  Administered 2018-10-01 – 2018-10-03 (×6): via INTRAVENOUS

## 2018-09-30 MED ORDER — SODIUM CHLORIDE 0.9 % IV BOLUS
1000.0000 mL | Freq: Once | INTRAVENOUS | Status: AC
  Administered 2018-09-30: 1000 mL via INTRAVENOUS

## 2018-09-30 MED ORDER — METOCLOPRAMIDE HCL 5 MG/ML IJ SOLN
5.0000 mg | Freq: Three times a day (TID) | INTRAMUSCULAR | Status: DC
  Administered 2018-09-30 – 2018-10-03 (×8): 5 mg via INTRAVENOUS
  Filled 2018-09-30 (×8): qty 2

## 2018-09-30 MED ORDER — LABETALOL HCL 100 MG PO TABS
200.0000 mg | ORAL_TABLET | Freq: Two times a day (BID) | ORAL | Status: DC
  Administered 2018-09-30 – 2018-10-03 (×6): 200 mg via ORAL
  Filled 2018-09-30 (×5): qty 2
  Filled 2018-09-30: qty 1

## 2018-09-30 MED ORDER — ACETAMINOPHEN 650 MG RE SUPP: 650.0000 mg | Freq: Four times a day (QID) | RECTAL | Status: DC | PRN

## 2018-09-30 NOTE — ED Notes (Signed)
Hospitalist at the bedside 

## 2018-09-30 NOTE — ED Provider Notes (Signed)
[place] DEPT Provider Note   CSN: [id number] Arrival date & time: 09/30/18  1725     History   Chief Complaint Chief Complaint  Patient presents with  . Abdominal Pain    HPI Frances Mccormick is a 32 y.o. female.  Pt presents to the ED today with abdominal pain and n/v.  The pt has not felt well since Saturday, October 26 when she had an abscess.  This is her 4th visit to the ED for the same.  None of the antinausea meds have been helping.  She had a CT abd/pelvis yesterday which showed:  IMPRESSION: 1. No acute intra-abdominal nor pelvic abnormality. 2. Mild inflammatory change along the medial aspect of the left buttock along the natal cleft without abscess.      Past Medical History:  Diagnosis Date  . Crohn's disease ([place])   . Migraine    "a few/year" (07/27/2018)  . Non-compliant patient     Patient Active Problem List   Diagnosis Date Noted  . Hypertension 09/30/2018  . Hypokalemia 09/30/2018  . Leukocytosis 09/30/2018  . Pilonidal abscess 09/30/2018  . Chronic hypertension during pregnancy, antepartum 08/11/2018  . Nausea & vomiting 07/26/2018  . Intractable vomiting with nausea 10/28/2014  . Medically noncompliant 10/26/2014  . Crohn's disease ([place]) 10/26/2014    Past Surgical History:  Procedure Laterality Date  . INDUCED ABORTION       OB History    Gravida  2   Para  0   Term      Preterm      AB  1   Living        SAB      TAB      Ectopic      Multiple      Live Births               Home Medications    Prior to Admission medications   Medication Sig Start Date End Date Taking? Authorizing Provider  acetaminophen (TYLENOL) 325 MG tablet Take 650 mg by mouth every 6 (six) hours as needed for mild pain.    Yes [provider]  cephALEXin (KEFLEX) 500 MG capsule Take 1 capsule (500 mg total) by mouth 4 (four) times daily. 09/25/18  Yes Neese, [place], NP    HYDROcodone-acetaminophen (NORCO/VICODIN) 5-325 MG tablet Take 1 tablet by mouth every 6 (six) hours as needed for severe pain. 09/28/18  Yes Little, Wenda Overland, MD  ondansetron (ZOFRAN ODT) 4 MG disintegrating tablet Take 1 tablet (4 mg total) by mouth every 8 (eight) hours as needed for nausea or vomiting. 09/28/18  Yes Little, Wenda Overland, MD  promethazine (PHENERGAN) 25 MG suppository Place 1 suppository (25 mg total) rectally every 6 (six) hours as needed for nausea or vomiting. 08/12/18  Yes Caccavale, Sophia, PA-C  sulfamethoxazole-trimethoprim (BACTRIM DS,SEPTRA DS) 800-160 MG tablet Take 1 tablet by mouth 2 (two) times daily for 7 days. 09/25/18 10/02/18 Yes Neese, Hope M, NP  labetalol (NORMODYNE) 200 MG tablet Take 1 tablet (200 mg total) by mouth 2 (two) times daily. Patient not taking: Reported on 09/27/2018 08/11/18   Leftwich-Kirby, Kathie Dike, CNM  metoCLOPramide (REGLAN) 10 MG tablet Take 1 tablet (10 mg total) by mouth every 8 (eight) hours as needed for nausea. Patient not taking: Reported on 09/30/2018 09/29/18   Long, Wonda Olds, MD  naproxen (NAPROSYN) 375 MG tablet Take 1 tablet (375 mg total) by mouth 2 (two) times daily.  Patient not taking: Reported on 09/30/2018 09/25/18   Ashley Murrain, NP  potassium chloride SA (K-DUR,KLOR-CON) 20 MEQ tablet Take 2 tablets (40 mEq total) by mouth daily for 4 days. Patient not taking: Reported on 09/30/2018 09/30/18 10/04/18  Long, Wonda Olds, MD  promethazine (PHENERGAN) 25 MG tablet Take 0.5-1 tablets (12.5-25 mg total) by mouth at bedtime as needed for nausea. Patient not taking: Reported on 09/29/2018 08/11/18   Elvera Maria, CNM    Family History Family History  Problem Relation Age of Onset  . Diabetes Mother   . Diabetes Father     Social History Social History   Tobacco Use  . Smoking status: Never Smoker  . Smokeless tobacco: Never Used  Substance Use Topics  . Alcohol use: Not Currently    Alcohol/week: 2.0  standard drinks    Types: 1 Glasses of wine, 1 Cans of beer per week  . Drug use: Yes    Types: Marijuana    Comment: occaisonally      Allergies   Bee venom and Ciprofloxacin   Review of Systems Review of Systems  Gastrointestinal: Positive for abdominal pain, nausea and vomiting.  Neurological: Positive for weakness and light-headedness.  All other systems reviewed and are negative.    Physical Exam Updated Vital Signs BP (!) 165/96   Pulse 85   Temp 98.6 F (37 C) (Oral)   Resp 15   Ht 5' (1.524 m)   Wt 86.2 kg   LMP 06/27/2018   SpO2 100%   BMI 37.11 kg/m   Physical Exam  Constitutional: She is oriented to person, place, and time. She appears well-developed and well-nourished.  HENT:  Head: Normocephalic and atraumatic.  Mouth/Throat: Mucous membranes are dry.  Eyes: Pupils are equal, round, and reactive to light. EOM are normal.  Cardiovascular: Tachycardia present.  Pulmonary/Chest: Effort normal and breath sounds normal.  Abdominal: Soft. Normal appearance and normal aorta. There is tenderness in the epigastric area.  Neurological: She is alert and oriented to person, place, and time.  Skin: Skin is warm. Capillary refill takes less than 2 seconds.  Healing pilonidal cyst  Psychiatric: She has a normal mood and affect. Her behavior is normal.  Nursing note and vitals reviewed.    ED Treatments / Results  Labs (all labs ordered are listed, but only abnormal results are displayed) Labs Reviewed  CBC WITH DIFFERENTIAL/PLATELET - Abnormal; Notable for the following components:      Result Value   WBC 20.6 (*)    RBC 5.44 (*)    MCV 75.2 (*)    MCH 24.3 (*)    Platelets 661 (*)    Neutro Abs 16.1 (*)    Monocytes Absolute 1.2 (*)    Abs Immature Granulocytes 0.19 (*)    All other components within normal limits  COMPREHENSIVE METABOLIC PANEL - Abnormal; Notable for the following components:   Potassium 3.0 (*)    Glucose, Bld 104 (*)    Total  Protein 8.6 (*)    All other components within normal limits  LIPASE, BLOOD  MAGNESIUM  URINALYSIS, ROUTINE W REFLEX MICROSCOPIC  PREGNANCY, URINE    EKG EKG Interpretation  Date/Time:  Thursday September 30 2018 21:41:22 EDT Ventricular Rate:  119 PR Interval:    QRS Duration: 71 QT Interval:  342 QTC Calculation: 482 R Axis:   41 Text Interpretation:  Sinus tachycardia Ventricular trigeminy Aberrant complex Left atrial enlargement Abnormal T, consider ischemia, anterior leads Since last tracing  rate faster Confirmed by Isla Pence [phone]) on 09/30/2018 9:50:43 PM   Radiology Ct Abdomen Pelvis Wo Contrast  Result Date: 09/29/2018 CLINICAL DATA:  Patient was on antibiotics for pilonidal cyst but developed nausea, vomiting and lower abdominal cramping. Patient discontinued antibiotics and has persistent nausea, vomiting and abdominal cramping. EXAM: CT ABDOMEN AND PELVIS WITHOUT CONTRAST TECHNIQUE: Multidetector CT imaging of the abdomen and pelvis was performed following the standard protocol without IV contrast. COMPARISON:  None. FINDINGS: LOWER CHEST: Lung bases are clear. Included heart size is normal. No pericardial effusion. HEPATOBILIARY: Liver and gallbladder are normal. PANCREAS: Normal. SPLEEN: Normal. ADRENALS/URINARY TRACT: Kidneys are orthotopic, demonstrating symmetric enhancement. No nephrolithiasis, hydronephrosis or solid renal masses. Urinary bladder is partially distended and unremarkable. Normal adrenal glands. STOMACH/BOWEL: The stomach, small and large bowel are normal in course and caliber without inflammatory changes. Normal appendix. VASCULAR/LYMPHATIC: Aortoiliac vessels are normal in course and caliber. No lymphadenopathy by CT size criteria. REPRODUCTIVE: Uterus and adnexa are unremarkable. OTHER: No intraperitoneal free fluid or free air. Minimal inflammatory change along the medial aspect of the left buttock along the natal cleft may represent site of  pilonidal cyst. MUSCULOSKELETAL: Nonacute. IMPRESSION: 1. No acute intra-abdominal nor pelvic abnormality. 2. Mild inflammatory change along the medial aspect of the left buttock along the natal cleft without abscess. Electronically Signed   By: Ashley Royalty M.D.   On: 09/29/2018 14:33    Procedures Procedures (including critical care time)  Medications Ordered in ED Medications  potassium chloride 10 mEq in 100 mL IVPB (10 mEq Intravenous New Bag/Given 09/30/18 2111)  sodium chloride 0.9 % bolus 1,500 mL (has no administration in time range)  labetalol (NORMODYNE) tablet 200 mg (has no administration in time range)  HYDROcodone-acetaminophen (NORCO/VICODIN) 5-325 MG per tablet 1 tablet (has no administration in time range)  cephALEXin (KEFLEX) capsule 500 mg (has no administration in time range)  metoCLOPramide (REGLAN) injection 5 mg (has no administration in time range)  ondansetron (ZOFRAN) injection 4 mg (has no administration in time range)  ondansetron (ZOFRAN) injection 4 mg (4 mg Intravenous Given 09/30/18 1951)  sodium chloride 0.9 % bolus 1,000 mL (1,000 mLs Intravenous New Bag/Given 09/30/18 1950)  morphine 4 MG/ML injection 4 mg (4 mg Intravenous Given 09/30/18 1953)  promethazine (PHENERGAN) injection 25 mg (25 mg Intravenous Given 09/30/18 2138)     Initial Impression / Assessment and Plan / ED Course  I have reviewed the triage vital signs and the nursing notes.  Pertinent labs & imaging results that were available during my care of the patient were reviewed by me and considered in my medical decision making (see chart for details).    Pt is feeling slightly better with IVFs, but still remains nauseous.  The pt given additional fluids and IV phenergan.  Potassium replaced.  The pt's urine is still not back, but she was not pregnant yesterday.  UA yesterday with >50WBCsm but neg nitrite and tr LE.  >50 sq epi.  Likely contaminated.   The pt is failing outpatient  treatment, so I discussed her with Dr. Blaine Hamper (triad) who will admit.  Final Clinical Impressions(s) / ED Diagnoses   Final diagnoses:  Intractable vomiting with nausea, unspecified vomiting type  Hypokalemia  Dehydration  Leukocytosis, unspecified type    ED Discharge Orders    None       Isla Pence, MD 09/30/18 2153

## 2018-09-30 NOTE — ED Triage Notes (Addendum)
Pt BIB [facility], Pt was recently seen here Monday and yesterday for same. Pt states n/v has not improved and she is still unable to keep anything down at home.  Pt states she has thrown up at least 12 times just today, mostly when she is trying to eat or drink. Pt states she rarely uses marijuana and did smoke some on Sunday but "only hit it one time then started coughing"

## 2018-09-30 NOTE — ED Notes (Signed)
Patient is complaining of vomiting all night. Patient states she feels like her heart is racing. Patient [id number]. Patient is not currently vomiting. Patient is nauseas. Patient is very anxious for [place] to get an IV.

## 2018-09-30 NOTE — H&P (Addendum)
History and Physical    Frances Mccormick JTT:[id number] DOB: Jan 24, 1986 DOA: 09/30/2018  Referring MD/NP/PA:   PCP: Patient, No Pcp Per   Patient coming from:  The patient is coming from home.  At baseline, pt is independent for most of ADL.   Chief Complaint: Intractable nausea, vomiting  HPI: Frances Mccormick is a 32 y.o. female with medical history significant of Crohn's disease, migraine headaches, marijuana abuse, who presents with intractable nausea vomiting.  Patient states that she has been having nausea, vomiting for more than 5 days, which has been progressively getting worse.  Patient denies abdominal pain or diarrhea.  He has had more than 20 times of non-biliary nonbloody vomiting.  No fever or chills.  Denies any chest pain, shortness breath, cough.  No symptoms of UTI.  Patient states that she had abortion on 9/20, and is impossible to have pregnancy again.  No vaginal bleeding or discharge.  Patient had pilonidal abscess and had I&D on Monday. She is supposed to take Keflex and Bactrim, but not be able to take it since she cannot keep anything down due to severe nausea vomiting. Pt has had 3 ED visits this week.  She had CT abdomen/pelvis yesterday, which is negative for acute intra-abdominal/pelvic issue.  Pregnancy test was negative yesterday. Patient states that sometimes she uses marijuana.   Of note, patient had similar issue with intractable nausea vomiting in the past. She was admitted to the hospital 9/26-9/27.She had MRI of the abdomen in that admissin, which did not show acute appendicitis or any evidence of active Crohn's colitis. Suspected her symptoms could be from a viral syndrome, or nausea and vomiting related to early pregnancy.   ED Course: pt was found to have WBC 20.6, lipase 76, pending pregnancy test, pending urinalysis, potassium 3.0, creatinine normal, magnesium 2.4, temperature normal, heart rate 103-->85, no tachypnea, oxygen saturation 100% on room  air.  Review of Systems:   General: no fevers, chills, no body weight gain, has poor appetite, has fatigue HEENT: no blurry vision, hearing changes or sore throat Respiratory: no dyspnea, coughing, wheezing CV: no chest pain, no palpitations GI: has nausea, vomiting, abdominal pain, no diarrhea, constipation GU: no dysuria, burning on urination, increased urinary frequency, hematuria  Ext: no leg edema Neuro: no unilateral weakness, numbness, or tingling, no vision change or hearing loss Skin: no rash, no skin tear. MSK: No muscle spasm, no deformity, no limitation of range of movement in spin Heme: No easy bruising.  Travel history: No recent long distant travel.  Allergy:  Allergies  Allergen Reactions  . Bee Venom Anaphylaxis  . Ciprofloxacin Anaphylaxis    Past Medical History:  Diagnosis Date  . Crohn's disease ([place])   . Migraine    "a few/year" (07/27/2018)  . Non-compliant patient     Past Surgical History:  Procedure Laterality Date  . INDUCED ABORTION      Social History:  reports that she has never smoked. She has never used smokeless tobacco. She reports that she drank about 2.0 standard drinks of alcohol per week. She reports that she has current or past drug history. Drug: Marijuana.  Family History:  Family History  Problem Relation Age of Onset  . Diabetes Mother   . Diabetes Father      Prior to Admission medications   Medication Sig Start Date End Date Taking? Authorizing Provider  acetaminophen (TYLENOL) 325 MG tablet Take 650 mg by mouth every 6 (six) hours as needed for mild  pain.    Yes [provider]  cephALEXin (KEFLEX) 500 MG capsule Take 1 capsule (500 mg total) by mouth 4 (four) times daily. 09/25/18  Yes Neese, [place]ine, NP  HYDROcodone-acetaminophen (NORCO/VICODIN) 5-325 MG tablet Take 1 tablet by mouth every 6 (six) hours as needed for severe pain. 09/28/18  Yes Little, Wenda Overland, MD  ondansetron (ZOFRAN ODT) 4 MG  disintegrating tablet Take 1 tablet (4 mg total) by mouth every 8 (eight) hours as needed for nausea or vomiting. 09/28/18  Yes Little, Wenda Overland, MD  promethazine (PHENERGAN) 25 MG suppository Place 1 suppository (25 mg total) rectally every 6 (six) hours as needed for nausea or vomiting. 08/12/18  Yes Caccavale, Sophia, PA-C  sulfamethoxazole-trimethoprim (BACTRIM DS,SEPTRA DS) 800-160 MG tablet Take 1 tablet by mouth 2 (two) times daily for 7 days. 09/25/18 10/02/18 Yes Neese, Hope M, NP  labetalol (NORMODYNE) 200 MG tablet Take 1 tablet (200 mg total) by mouth 2 (two) times daily. Patient not taking: Reported on 09/27/2018 08/11/18   Leftwich-Kirby, Kathie Dike, CNM  metoCLOPramide (REGLAN) 10 MG tablet Take 1 tablet (10 mg total) by mouth every 8 (eight) hours as needed for nausea. Patient not taking: Reported on 09/30/2018 09/29/18   Long, Wonda Olds, MD  naproxen (NAPROSYN) 375 MG tablet Take 1 tablet (375 mg total) by mouth 2 (two) times daily. Patient not taking: Reported on 09/30/2018 09/25/18   Ashley Murrain, NP  potassium chloride SA (K-DUR,KLOR-CON) 20 MEQ tablet Take 2 tablets (40 mEq total) by mouth daily for 4 days. Patient not taking: Reported on 09/30/2018 09/30/18 10/04/18  Long, Wonda Olds, MD  promethazine (PHENERGAN) 25 MG tablet Take 0.5-1 tablets (12.5-25 mg total) by mouth at bedtime as needed for nausea. Patient not taking: Reported on 09/29/2018 08/11/18   Elvera Maria, CNM    Physical Exam: Vitals:   09/30/18 1951 09/30/18 2100 09/30/18 2300 09/30/18 2337  BP: (!) 187/116 (!) 165/96 128/83 (!) 185/118  Pulse: 84 85 93 94  Resp: 14 15 16    Temp:      TempSrc:      SpO2: 100% 100% 100%   Weight:      Height:       General: Not in acute distress.  Dry mucous membrane HEENT:       Eyes: PERRL, EOMI, no scleral icterus.       ENT: No discharge from the ears and nose, no pharynx injection, no tonsillar enlargement.        Neck: No JVD, no bruit, no mass  felt. Heme: No neck lymph node enlargement. Cardiac: S1/S2, RRR, No murmurs, No gallops or rubs. Respiratory:  No rales, wheezing, rhonchi or rubs. GI: Soft, nondistended, nontender, no rebound pain, no organomegaly, BS present. GU: No hematuria Ext: No pitting leg edema bilaterally. 2+DP/PT pulse bilaterally. Musculoskeletal: No joint deformities, No joint redness or warmth, no limitation of ROM in spin. Skin: No rashes.  Neuro: Alert, oriented X3, cranial nerves II-XII grossly intact, moves all extremities normally. Psych: Patient is not psychotic, no suicidal or hemocidal ideation.  Labs on Admission: I have personally reviewed following labs and imaging studies  CBC: Recent Labs  Lab 09/27/18 1730 09/29/18 1050 09/30/18 1950  WBC 19.2* 19.9* 20.6*  NEUTROABS  --  16.6* 16.1*  HGB 11.5* 13.2 13.2  HCT 36.6 41.9 40.9  MCV 77.4* 76.6* 75.2*  PLT 560* 572* 563*   Basic Metabolic Panel: Recent Labs  Lab 09/27/18 1730 09/29/18 1450 09/30/18 1950  NA 137 136 137  K 3.7 2.9* 3.0*  CL 102 100 100  CO2 24 24 23   GLUCOSE 119* 98 104*  BUN 7 16 17   CREATININE 0.83 0.91 0.94  CALCIUM 8.9 9.1 9.1  MG  --   --  2.4   GFR: Estimated Creatinine Clearance: 83.8 mL/min (by C-G formula based on SCr of 0.94 mg/dL). Liver Function Tests: Recent Labs  Lab 09/27/18 1730 09/29/18 1450 09/30/18 1950  AST 26 17 19   ALT 17 14 13   ALKPHOS 68 61 60  BILITOT 0.4 0.6 1.0  PROT 9.0* 8.6* 8.6*  ALBUMIN 4.2 4.1 4.4   Recent Labs  Lab 09/27/18 1730 09/29/18 1450 09/30/18 1950  LIPASE 26 76* 37   No results for input(s): AMMONIA in the last 168 hours. Coagulation Profile: No results for input(s): INR, PROTIME in the last 168 hours. Cardiac Enzymes: No results for input(s): CKTOTAL, CKMB, CKMBINDEX, TROPONINI in the last 168 hours. BNP (last 3 results) No results for input(s): PROBNP in the last 8760 hours. HbA1C: No results for input(s): HGBA1C in the last 72 hours. CBG: No  results for input(s): GLUCAP in the last 168 hours. Lipid Profile: No results for input(s): CHOL, HDL, LDLCALC, TRIG, CHOLHDL, LDLDIRECT in the last 72 hours. Thyroid Function Tests: No results for input(s): TSH, T4TOTAL, FREET4, T3FREE, THYROIDAB in the last 72 hours. Anemia Panel: No results for input(s): VITAMINB12, FOLATE, FERRITIN, TIBC, IRON, RETICCTPCT in the last 72 hours. Urine analysis:    Component Value Date/Time   COLORURINE AMBER (A) 09/29/2018 1450   APPEARANCEUR CLOUDY (A) 09/29/2018 1450   LABSPEC 1.026 09/29/2018 1450   PHURINE 6.0 09/29/2018 1450   GLUCOSEU 50 (A) 09/29/2018 1450   HGBUR LARGE (A) 09/29/2018 1450   BILIRUBINUR NEGATIVE 09/29/2018 1450   KETONESUR 80 (A) 09/29/2018 1450   PROTEINUR 100 (A) 09/29/2018 1450   UROBILINOGEN 0.2 06/08/2015 0736   NITRITE NEGATIVE 09/29/2018 1450   LEUKOCYTESUR TRACE (A) 09/29/2018 1450   Sepsis Labs: @LABRCNTIP (procalcitonin:4,lacticidven:4) ) Recent Results (from the past 240 hour(s))  Wet prep, genital     Status: Abnormal   Collection Time: 09/29/18  3:10 PM  Result Value Ref Range Status   Yeast Wet Prep HPF POC NONE SEEN NONE SEEN Final   Trich, Wet Prep NONE SEEN NONE SEEN Final   Clue Cells Wet Prep HPF POC NONE SEEN NONE SEEN Final   WBC, Wet Prep HPF POC FEW (A) NONE SEEN Final   Sperm NONE SEEN  Final    Comment: Performed at [facility], [place] [address] [place], [place] [address]     Radiological Exams on Admission: Ct Abdomen Pelvis Wo Contrast  Result Date: 09/29/2018 CLINICAL DATA:  Patient was on antibiotics for pilonidal cyst but developed nausea, vomiting and lower abdominal cramping. Patient discontinued antibiotics and has persistent nausea, vomiting and abdominal cramping. EXAM: CT ABDOMEN AND PELVIS WITHOUT CONTRAST TECHNIQUE: Multidetector CT imaging of the abdomen and pelvis was performed following the standard protocol without IV contrast. COMPARISON:  None. FINDINGS:  LOWER CHEST: Lung bases are clear. Included heart size is normal. No pericardial effusion. HEPATOBILIARY: Liver and gallbladder are normal. PANCREAS: Normal. SPLEEN: Normal. ADRENALS/URINARY TRACT: Kidneys are orthotopic, demonstrating symmetric enhancement. No nephrolithiasis, hydronephrosis or solid renal masses. Urinary bladder is partially distended and unremarkable. Normal adrenal glands. STOMACH/BOWEL: The stomach, small and large bowel are normal in course and caliber without inflammatory changes. Normal appendix. VASCULAR/LYMPHATIC: Aortoiliac vessels are normal in course and caliber.  No lymphadenopathy by CT size criteria. REPRODUCTIVE: Uterus and adnexa are unremarkable. OTHER: No intraperitoneal free fluid or free air. Minimal inflammatory change along the medial aspect of the left buttock along the natal cleft may represent site of pilonidal cyst. MUSCULOSKELETAL: Nonacute. IMPRESSION: 1. No acute intra-abdominal nor pelvic abnormality. 2. Mild inflammatory change along the medial aspect of the left buttock along the natal cleft without abscess. Electronically Signed   By: Ashley Royalty M.D.   On: 09/29/2018 14:33     EKG: Independently reviewed.  Sinus tachycardia, QTC 477, LAE, nonspecific T wave change.  Assessment/Plan Principal Problem:   Intractable nausea and vomiting Active Problems:   Crohn's disease (HCC)   Hypertension   Hypokalemia   Leukocytosis   Pilonidal abscess   Intractable nausea and vomiting: Etiology is not clear.  Potential differential diagnosis include viral gastroenteritis and marijuana abuse.  Recent MRI on 08/12/2018 and yesterday's CT scan did not show evidence for Crohn's disease flareup.  Patient does not have diarrhea.  -place on med-surg bed for obs -Scheduled Reglan plus as needed Zofran -IV fluid: 2.5 L normal saline, followed by 125 cc/h -Check UDS -f/u UA  Crohn's disease ([place]):  Recent MRI on 08/12/2018 and yesterday's CT scan did not show  evidence for Crohn's disease flareup.  No diarrhea. -observe  Hypertension:  -PRN hydralazine -Continue labetalol  Hypokalemia: K=3.0 on admission. - Repleted - Check Mg level  Leukocytosis and pilonidal abscess: pt is s/p of I&d for Pilonidal abscess. She does not have worsening pain.  No fever.  She is supposed to take Keflex and Bactrim, but is not taking these medications due to severe nausea vomiting.  Since patient still has leukocytosis, will restart antibiotics. -Keflex -Get blood culture -wound care consult   DVT ppx:  SQ Lovenox Code Status: Full code Family Communication:   Yes, patient's  mother  at bed side Disposition Plan:  Anticipate discharge back to previous home environment Consults called:  none Admission status:  medical floor/obs       Date of Service 09/30/2018    Ivor Costa [facility] Pager [phone]  If 7PM-7AM, please contact night-coverage [email or web address] Password [email or web address] 09/30/2018, 11:55 PM

## 2018-10-01 ENCOUNTER — Encounter (HOSPITAL_COMMUNITY): Payer: Self-pay | Admitting: Internal Medicine

## 2018-10-01 DIAGNOSIS — E876 Hypokalemia: Secondary | ICD-10-CM

## 2018-10-01 DIAGNOSIS — I959 Hypotension, unspecified: Secondary | ICD-10-CM | POA: Diagnosis present

## 2018-10-01 DIAGNOSIS — I10 Essential (primary) hypertension: Secondary | ICD-10-CM

## 2018-10-01 DIAGNOSIS — E860 Dehydration: Secondary | ICD-10-CM | POA: Diagnosis present

## 2018-10-01 DIAGNOSIS — E871 Hypo-osmolality and hyponatremia: Secondary | ICD-10-CM | POA: Diagnosis present

## 2018-10-01 DIAGNOSIS — F1110 Opioid abuse, uncomplicated: Secondary | ICD-10-CM | POA: Diagnosis present

## 2018-10-01 DIAGNOSIS — R112 Nausea with vomiting, unspecified: Secondary | ICD-10-CM | POA: Diagnosis present

## 2018-10-01 DIAGNOSIS — F1210 Cannabis abuse, uncomplicated: Secondary | ICD-10-CM | POA: Diagnosis present

## 2018-10-01 DIAGNOSIS — Z9119 Patient's noncompliance with other medical treatment and regimen: Secondary | ICD-10-CM | POA: Diagnosis not present

## 2018-10-01 DIAGNOSIS — Z79899 Other long term (current) drug therapy: Secondary | ICD-10-CM | POA: Diagnosis not present

## 2018-10-01 DIAGNOSIS — L0501 Pilonidal cyst with abscess: Secondary | ICD-10-CM

## 2018-10-01 DIAGNOSIS — D72829 Elevated white blood cell count, unspecified: Secondary | ICD-10-CM

## 2018-10-01 LAB — BASIC METABOLIC PANEL
ANION GAP: 10 (ref 5–15)
BUN: 11 mg/dL (ref 6–20)
CALCIUM: 7.7 mg/dL — AB (ref 8.9–10.3)
CO2: 22 mmol/L (ref 22–32)
CREATININE: 0.78 mg/dL (ref 0.44–1.00)
Chloride: 101 mmol/L (ref 98–111)
GFR calc Af Amer: >60 mL/min (ref >60)
Glucose, Bld: 113 mg/dL — ABNORMAL HIGH (ref 70–99)
Potassium: 4 mmol/L (ref 3.5–5.1)
Sodium: 133 mmol/L — ABNORMAL LOW (ref 135–145)

## 2018-10-01 LAB — CBC
HCT: 38.2 % (ref 36.0–46.0)
Hemoglobin: 12.2 g/dL (ref 12.0–15.0)
MCH: 24.3 pg — AB (ref 26.0–34.0)
MCHC: 31.9 g/dL (ref 30.0–36.0)
MCV: 75.9 fL — ABNORMAL LOW (ref 80.0–100.0)
PLATELETS: 530 10*3/uL — AB (ref 150–400)
RBC: 5.03 MIL/uL (ref 3.87–5.11)
RDW: 15.4 % (ref 11.5–15.5)
WBC: 19.1 10*3/uL — ABNORMAL HIGH (ref 4.0–10.5)
nRBC: 0 % (ref 0.0–0.2)

## 2018-10-01 LAB — URINE CULTURE
Culture: >=100000 — AB
SPECIAL REQUESTS: NORMAL

## 2018-10-01 LAB — LACTIC ACID, PLASMA
LACTIC ACID, VENOUS: 0.9 mmol/L (ref 0.5–1.9)
LACTIC ACID, VENOUS: 0.9 mmol/L (ref 0.5–1.9)

## 2018-10-01 MED ORDER — FAMOTIDINE 20 MG PO TABS
20.0000 mg | ORAL_TABLET | Freq: Two times a day (BID) | ORAL | Status: DC
  Administered 2018-10-01 – 2018-10-03 (×5): 20 mg via ORAL
  Filled 2018-10-01 (×5): qty 1

## 2018-10-01 MED ORDER — LIDOCAINE VISCOUS HCL 2 % MT SOLN
15.0000 mL | Freq: Once | OROMUCOSAL | Status: AC
  Administered 2018-10-01: 15 mL via ORAL
  Filled 2018-10-01: qty 15

## 2018-10-01 MED ORDER — ALUM & MAG HYDROXIDE-SIMETH 200-200-20 MG/5ML PO SUSP
30.0000 mL | Freq: Once | ORAL | Status: AC
  Administered 2018-10-01: 30 mL via ORAL
  Filled 2018-10-01: qty 30

## 2018-10-01 MED ORDER — PANTOPRAZOLE SODIUM 40 MG PO TBEC
40.0000 mg | DELAYED_RELEASE_TABLET | Freq: Every day | ORAL | Status: DC
  Administered 2018-10-01 – 2018-10-03 (×3): 40 mg via ORAL
  Filled 2018-10-01 (×3): qty 1

## 2018-10-01 NOTE — Plan of Care (Signed)

## 2018-10-01 NOTE — Consult Note (Signed)
[facility] Nurse wound consult note.  Patient evaluated at [id number]. No family present. Reason for Consult: I & D Pilonidal Abscess. The following pertinent facts were learned during evaluation and interview.  1. The patient had "cysts" that began erupting at the time of puberty in her axilla and inner upper thighs. The scarring and history is consistent with Hidradenitis Suppurativa (HS). Now she had this new cyst in the pilonidal area.  2. All areas healed except for the pilonidal location.  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 Hurley's Grade II:  Recurrent abscesses with tract formation and cicatrization.  Single or multiple, widely separated lesions. Hurley's Grade III:  Diffuse or near-diffuse involvement, or multiple interconnected tracts and abscesses across entire area.  Antibiotic therapy reported in the literature includes:  ([place] (2012). Hidradenitis Supporativa.  [place] of Medicine. 366(2) pg. 158-164.) (1) topical administration of clindamycin (10 mg per milliliter twice daily) over three months.   (2) Tetracycline 500 mg twice daily for three months.  (3) Clindaymycin and Rifampin 300 mg twice daily of both, length of time for administration was not indicated.   According to Judi Cong, [place] (2019). Hidradenitis Suppurativa. [employer] Clinical Advisor, e1, pg. [phone]: Topical clindamycin is appropriate for Hurley's Grade I. Cephalosporins, dicloxacillin, erythromycin, minocycline, and tetracycline may also prove helpful for Hurley's Grade II. Severe, recurrent disease can require 3 to 6 months of antibiotics.  Short courses of oral corticosteriods may be helpful in flares, but efficacy is lost after these area discontinued. Zinc salts (zinc gluconate 90 mg TID), and topical metronidazole gel twice daily may also prove helpful. ([place]  (2012). Hidradenitis Suppurativa.  Journal for Nurse Practitioners, 8(3). Pg. [phone].)  MD if you agree, please consider ordering one of the antibiotics listed above.  Plan of care: twice daily packing with iodoform packing strip. Cover with gauze and tape in place. Teach mother to perform dressing change.   Thank you for the consult.  Discussed plan of care with the patient and bedside nurse.  [facility] nurse will not follow at this time.  Please re-consult the [facility] team if needed.  Val Riles, RN, MSN, CWOCN, CNS-BC, pager [phone]

## 2018-10-01 NOTE — Plan of Care (Signed)
Plan of care discussed.   

## 2018-10-01 NOTE — ED Notes (Signed)
ED TO INPATIENT HANDOFF REPORT  Name/Age/Gender Frances Mccormick 32 y.o. female  Code Status    Code Status Orders  (From admission, onward)         Start     Ordered   09/30/18 2156  Full code  Continuous     09/30/18 2156        Code Status History    Date Active Date Inactive Code Status Order ID Comments User Context   07/26/2018 2207 07/27/2018 2152 Full Code [id number]  Jani Gravel, MD ED   01/06/2015 1344 01/08/2015 2122 Full Code [id number]  Velvet Bathe, MD Inpatient   10/28/2014 1302 10/31/2014 1717 Full Code [id number]  Reyne Dumas, MD ED      Home/SNF/Other Home  Chief Complaint abd pain  Level of Care/Admitting Diagnosis ED Disposition    ED Disposition Condition [facility] Area: [facility] [[id number]]  Level of Care: Med-Surg [16]  Diagnosis: Intractable nausea and vomiting [267124]  Admitting Physician: Ivor Costa [[id number]]  Attending Physician: Ivor Costa [[id number]]  PT Class (Do Not Modify): Observation [104]  PT Acc Code (Do Not Modify): Observation [10022]       Medical History Past Medical History:  Diagnosis Date  . Crohn's disease ([place])   . Migraine    "a few/year" (07/27/2018)  . Non-compliant patient     Allergies Allergies  Allergen Reactions  . Bee Venom Anaphylaxis  . Ciprofloxacin Anaphylaxis    IV Location/Drains/Wounds Patient Lines/Drains/Airways Status   Active Line/Drains/Airways    Name:   Placement date:   Placement time:   Site:   Days:   Peripheral IV 09/30/18 Left Antecubital   09/30/18    1950    Antecubital   1   Peripheral IV 10/01/18 Right Antecubital   10/01/18    0134    Antecubital   less than 1          Labs/Imaging Results for orders placed or performed during the hospital encounter of 09/30/18 (from the past 48 hour(s))  CBC with Differential     Status: Abnormal   Collection Time: 09/30/18  7:50 PM  Result Value Ref Range   WBC 20.6 (H) 4.0 - 10.5 K/uL   RBC 5.44 (H) 3.87  - 5.11 MIL/uL   Hemoglobin 13.2 12.0 - 15.0 g/dL   HCT 40.9 36.0 - 46.0 %   MCV 75.2 (L) 80.0 - 100.0 fL   MCH 24.3 (L) 26.0 - 34.0 pg   MCHC 32.3 30.0 - 36.0 g/dL   RDW 15.2 11.5 - 15.5 %   Platelets 661 (H) 150 - 400 K/uL   nRBC 0.0 0.0 - 0.2 %   Neutrophils Relative % 78 %   Neutro Abs 16.1 (H) 1.7 - 7.7 K/uL   Lymphocytes Relative 14 %   Lymphs Abs 2.9 0.7 - 4.0 K/uL   Monocytes Relative 6 %   Monocytes Absolute 1.2 (H) 0.1 - 1.0 K/uL   Eosinophils Relative 0 %   Eosinophils Absolute 0.0 0.0 - 0.5 K/uL   Basophils Relative 1 %   Basophils Absolute 0.1 0.0 - 0.1 K/uL   Immature Granulocytes 1 %   Abs Immature Granulocytes 0.19 (H) 0.00 - 0.07 K/uL    Comment: Performed at [facility], [place] [address] [place], [place] [address]  Comprehensive metabolic panel     Status: Abnormal   Collection Time: 09/30/18  7:50 PM  Result Value Ref Range   Sodium 137  135 - 145 mmol/L   Potassium 3.0 (L) 3.5 - 5.1 mmol/L   Chloride 100 98 - 111 mmol/L   CO2 23 22 - 32 mmol/L   Glucose, Bld 104 (H) 70 - 99 mg/dL   BUN 17 6 - 20 mg/dL   Creatinine, Ser 0.94 0.44 - 1.00 mg/dL   Calcium 9.1 8.9 - 10.3 mg/dL   Total Protein 8.6 (H) 6.5 - 8.1 g/dL   Albumin 4.4 3.5 - 5.0 g/dL   AST 19 15 - 41 U/L   ALT 13 0 - 44 U/L   Alkaline Phosphatase 60 38 - 126 U/L   Total Bilirubin 1.0 0.3 - 1.2 mg/dL   GFR calc non Af Amer >60 >60 mL/min   GFR calc Af Amer >60 >60 mL/min    Comment: (NOTE) The eGFR has been calculated using the CKD EPI equation. This calculation has not been validated in all clinical situations. eGFR's persistently <60 mL/min signify possible Chronic Kidney Disease.    Anion gap 14 5 - 15    Comment: Performed at [facility], [place] [address] [place], [place] [address]  Lipase, blood     Status: None   Collection Time: 09/30/18  7:50 PM  Result Value Ref Range   Lipase 37 11 - 51 U/L    Comment: Performed at [facility], [place] [address] [place], [place] [address]  Magnesium     Status: None   Collection Time: 09/30/18  7:50 PM  Result Value Ref Range   Magnesium 2.4 1.7 - 2.4 mg/dL    Comment: Performed at [facility], [place] [address] [place], [place] [address]  Procalcitonin     Status: None   Collection Time: 09/30/18  7:50 PM  Result Value Ref Range   Procalcitonin <0.10 ng/mL    Comment:        Interpretation: PCT (Procalcitonin) <= 0.5 ng/mL: Systemic infection (sepsis) is not likely. Local bacterial infection is possible. (NOTE)       Sepsis PCT Algorithm           Lower Respiratory Tract                                      Infection PCT Algorithm    ----------------------------     ----------------------------         PCT < 0.25 ng/mL                PCT < 0.10 ng/mL         Strongly encourage             Strongly discourage   discontinuation of antibiotics    initiation of antibiotics    ----------------------------     -----------------------------       PCT 0.25 - 0.50 ng/mL            PCT 0.10 - 0.25 ng/mL               OR       >80% decrease in PCT            Discourage initiation of                                            antibiotics  Encourage discontinuation           of antibiotics    ----------------------------     -----------------------------         PCT >= 0.50 ng/mL              PCT 0.26 - 0.50 ng/mL               AND        <80% decrease in PCT             Encourage initiation of                                             antibiotics       Encourage continuation           of antibiotics    ----------------------------     -----------------------------        PCT >= 0.50 ng/mL                  PCT > 0.50 ng/mL               AND         increase in PCT                  Strongly encourage                                      initiation of antibiotics    Strongly encourage escalation           of antibiotics                                      -----------------------------                                           PCT <= 0.25 ng/mL                                                 OR                                        > 80% decrease in PCT                                     Discontinue / Do not initiate                                             antibiotics Performed at [place] [address], [place], [place] [address]   Urinalysis, Routine w reflex microscopic     Status: Abnormal   Collection Time: 09/30/18 10:37 PM  Result Value Ref Range   Color, Urine YELLOW YELLOW   APPearance HAZY (A) CLEAR   Specific Gravity, Urine 1.025 1.005 - 1.030   pH 6.0 5.0 - 8.0   Glucose, UA NEGATIVE NEGATIVE mg/dL   Hgb urine dipstick LARGE (A) NEGATIVE   Bilirubin Urine NEGATIVE NEGATIVE   Ketones, ur 80 (A) NEGATIVE mg/dL   Protein, ur 100 (A) NEGATIVE mg/dL   Nitrite NEGATIVE NEGATIVE   Leukocytes, UA NEGATIVE NEGATIVE   RBC / HPF >50 (H) 0 - 5 RBC/hpf   WBC, UA 11-20 0 - 5 WBC/hpf   Bacteria, UA RARE (A) NONE SEEN   Squamous Epithelial / LPF 6-10 0 - 5   Mucus PRESENT    Hyaline Casts, UA PRESENT     Comment: Performed at [facility], [place] [address], [place], [place] [address]  Pregnancy, urine     Status: None   Collection Time: 09/30/18 10:37 PM  Result Value Ref Range   Preg Test, Ur NEGATIVE NEGATIVE    Comment:        THE SENSITIVITY OF THIS METHODOLOGY IS >20 mIU/mL. Performed at [facility], [place] [address] [place], [place] [address]   Urine rapid drug screen (hosp performed)     Status: Abnormal   Collection Time: 09/30/18 10:37 PM  Result Value Ref Range   Opiates POSITIVE (A) NONE DETECTED   Cocaine NONE DETECTED NONE DETECTED   Benzodiazepines NONE DETECTED NONE DETECTED   Amphetamines NONE DETECTED NONE DETECTED   Tetrahydrocannabinol POSITIVE (A) NONE DETECTED   Barbiturates NONE DETECTED NONE DETECTED    Comment: (NOTE) DRUG  SCREEN FOR MEDICAL PURPOSES ONLY.  IF CONFIRMATION IS NEEDED FOR ANY PURPOSE, NOTIFY LAB WITHIN 5 DAYS. LOWEST DETECTABLE LIMITS FOR URINE DRUG SCREEN Drug Class                     Cutoff (ng/mL) Amphetamine and metabolites    1000 Barbiturate and metabolites    200 Benzodiazepine                 465 Tricyclics and metabolites     300 Opiates and metabolites        300 Cocaine and metabolites        300 THC                            50 Performed at [facility], [place] [address] [place], [place] [address]    Ct Abdomen Pelvis Wo Contrast  Result Date: 09/29/2018 CLINICAL DATA:  Patient was on antibiotics for pilonidal cyst but developed nausea, vomiting and lower abdominal cramping. Patient discontinued antibiotics and has persistent nausea, vomiting and abdominal cramping. EXAM: CT ABDOMEN AND PELVIS WITHOUT CONTRAST TECHNIQUE: Multidetector CT imaging of the abdomen and pelvis was performed following the standard protocol without IV contrast. COMPARISON:  None. FINDINGS: LOWER CHEST: Lung bases are clear. Included heart size is normal. No pericardial effusion. HEPATOBILIARY: Liver and gallbladder are normal. PANCREAS: Normal. SPLEEN: Normal. ADRENALS/URINARY TRACT: Kidneys are orthotopic, demonstrating symmetric enhancement. No nephrolithiasis, hydronephrosis or solid renal masses. Urinary bladder is partially distended and unremarkable. Normal adrenal glands. STOMACH/BOWEL: The stomach, small and large bowel are normal in course and caliber without inflammatory changes. Normal appendix. VASCULAR/LYMPHATIC: Aortoiliac vessels are normal in course and caliber. No lymphadenopathy by CT size criteria. REPRODUCTIVE: Uterus and adnexa are unremarkable. OTHER: No intraperitoneal free fluid or free  air. Minimal inflammatory change along the medial aspect of the left buttock along the natal cleft may represent site of pilonidal cyst. MUSCULOSKELETAL: Nonacute. IMPRESSION: 1. No  acute intra-abdominal nor pelvic abnormality. 2. Mild inflammatory change along the medial aspect of the left buttock along the natal cleft without abscess. Electronically Signed   By: Ashley Royalty M.D.   On: 09/29/2018 14:33   EKG Interpretation  Date/Time:  Thursday September 30 2018 21:41:22 EDT Ventricular Rate:  119 PR Interval:    QRS Duration: 71 QT Interval:  342 QTC Calculation: 482 R Axis:   41 Text Interpretation:  Sinus tachycardia Ventricular trigeminy Aberrant complex Left atrial enlargement Abnormal T, consider ischemia, anterior leads Since last tracing rate faster Confirmed by Isla Pence [phone]) on 09/30/2018 9:50:43 PM   Pending Labs Unresulted Labs (From admission, onward)    Start     Ordered   10/01/18 9163  Basic metabolic panel  Tomorrow morning,   R     09/30/18 2156   10/01/18 0500  CBC  Tomorrow morning,   R     09/30/18 2156   09/30/18 2154  Culture, blood (x 2)  BLOOD CULTURE X 2,   STAT    Comments:  INITIATE ANTIBIOTICS WITHIN 1 HOUR AFTER BLOOD CULTURES DRAWN.  If unable to obtain blood cultures, call MD immediately regarding antibiotic instructions.    09/30/18 2154   09/30/18 2154  Lactic acid, plasma  STAT Now then every 3 hours,   STAT     09/30/18 2154          Vitals/Pain Today's Vitals   10/01/18 0000 10/01/18 0030 10/01/18 0100 10/01/18 0133  BP: (!) 187/103 (!) 166/107 (!) 182/126 (!) 148/104  Pulse: 81 86 85 80  Resp: (!) '21 18 20 15  '$ Temp:      TempSrc:      SpO2: 100% 100% 100% 100%  Weight:      Height:      PainSc:        Isolation Precautions No active isolations  Medications Medications  labetalol (NORMODYNE) tablet 200 mg (200 mg Oral Given 09/30/18 2337)  HYDROcodone-acetaminophen (NORCO/VICODIN) 5-325 MG per tablet 1 tablet (has no administration in time range)  cephALEXin (KEFLEX) capsule 500 mg (500 mg Oral Given 09/30/18 2259)  metoCLOPramide (REGLAN) injection 5 mg (5 mg Intravenous Given 09/30/18 2334)   ondansetron (ZOFRAN) injection 4 mg (has no administration in time range)  0.9 %  sodium chloride infusion ( Intravenous New Bag/Given 10/01/18 0045)  enoxaparin (LOVENOX) injection 40 mg (has no administration in time range)  acetaminophen (TYLENOL) tablet 650 mg (has no administration in time range)    Or  acetaminophen (TYLENOL) suppository 650 mg (has no administration in time range)  hydrALAZINE (APRESOLINE) injection 5 mg (has no administration in time range)  zolpidem (AMBIEN) tablet 5 mg (has no administration in time range)  ondansetron (ZOFRAN) injection 4 mg (4 mg Intravenous Given 09/30/18 1951)  sodium chloride 0.9 % bolus 1,000 mL (0 mLs Intravenous Stopped 09/30/18 2303)  morphine 4 MG/ML injection 4 mg (4 mg Intravenous Given 09/30/18 1953)  potassium chloride 10 mEq in 100 mL IVPB (0 mEq Intravenous Stopped 09/30/18 2303)  promethazine (PHENERGAN) injection 25 mg (25 mg Intravenous Given 09/30/18 2138)  sodium chloride 0.9 % bolus 1,500 mL (0 mLs Intravenous Stopped 09/30/18 2312)  potassium chloride 20 MEQ/15ML (10%) solution 40 mEq (40 mEq Oral Given 09/30/18 2301)    Mobility walks

## 2018-10-01 NOTE — Progress Notes (Signed)
PROGRESS NOTE    Frances Mccormick   JEH:[id number]  DOB: 1986-07-23  DOA: 09/30/2018 PCP: Patient, No Pcp Per   Brief Narrative:  Frances Mccormick  is a 32 y.o. female with medical history significant for migraine headaches,  marijuana abuse, who presents with intractable nausea vomiting starting about 5 days prior to admission with more than 20 times of non-biliary nonbloody vomiting. She previously carried the diagnosis of Crohn's disease but it was confirmed by GI, Dr Lelon Frohlich on 01/08/15 that she does not have this diagnosis. She had a pilonidal abscess and  I&D on Monday in the ED. She took Keflex and Bactrim, but vomiting got worse and she developed diarrhea. Diarehea has since resolved.  Of note, patient had similar issue with intractable nausea vomiting in the past. She was admitted to the hospital 9/26-9/27.She had MRI of the abdomen in that admissin, which did not show acute appendicitis or any evidence of active Crohn's colitis. Found to have WBC 20.6 & lipase 76. CT abd/pelvis on 10/30> 1. No acute intra-abdominal nor pelvic abnormality. 2. Mild inflammatory change along the medial aspect of the left buttock along the natal cleft without abscess.  Last admitted from 07/26/18 - 07/27/18 for intractable nausea/ vomiting. Also found to have a UTI, Trichomoniasis, bacterial vaginosis & possibly early pregnancy (urine preg test + but no fetus on ultrasound)  Subjective: This AM, felt better. Had not vomited overnight. Called by RN later that she was barely able to tolerate clear liquids as she developed abdominal pain.   Assessment & Plan:   Principal Problem:   Intractable nausea and vomiting  - PUD? She takes Naproxen as outpt - add Pepcid and Protonix, try GI cocktail -  cont clear liquids, TID Reglan, PRN Zofran  Active Problems:   Leukocytosis   Pilonidal abscess - on Keflex- CT showed no abscess- cont Keflex  Mild hyponatremia - cont IVF     Hypertension - Labetalol   Hypokalemia - replaced  Opiates and Marijuana + on UDS - received Opiates in ED which could accout for what is seen on UDS - counseled on Marijuana use   DVT prophylaxis: Lovenox Code Status: Full code Family Communication:  Disposition Plan: home when able to tolerate solids Consultants:   none Procedures:   none Antimicrobials:  Anti-infectives (From admission, onward)   Start     Dose/Rate Route Frequency Ordered Stop   09/30/18 2230  cephALEXin (KEFLEX) capsule 500 mg     500 mg Oral 4 times daily 09/30/18 2152         Objective: Vitals:   10/01/18 0640 10/01/18 1054 10/01/18 1054 10/01/18 1504  BP: (!) 166/99 119/78 119/78 (!) 163/94  Pulse: 66 81 83 82  Resp: 16   16  Temp: 98.4 F (36.9 C)  98.2 F (36.8 C) 99.1 F (37.3 C)  TempSrc: Oral  Oral   SpO2: 100%  100% 100%  Weight:      Height:        Intake/Output Summary (Last 24 hours) at 10/01/2018 1540 Last data filed at 10/01/2018 1000 Gross per 24 hour  Intake 2260.21 ml  Output 900 ml  Net 1360.21 ml   Filed Weights   09/30/18 1736  Weight: 86.2 kg    Examination: General exam: Appears comfortable  HEENT: PERRLA, oral mucosa moist, no sclera icterus or thrush Respiratory system: Clear to auscultation. Respiratory effort normal. Cardiovascular system: S1 & S2 heard, RRR.   Gastrointestinal system: Abdomen soft, non-tender, nondistended.  Normal bowel sounds. Central nervous system: Alert and oriented. No focal neurological deficits. Extremities: No cyanosis, clubbing or edema Skin: No rashes or ulcers Psychiatry:  Mood & affect appropriate.     Data Reviewed: I have personally reviewed following labs and imaging studies  CBC: Recent Labs  Lab 09/27/18 1730 09/29/18 1050 09/30/18 1950 10/01/18 0244  WBC 19.2* 19.9* 20.6* 19.1*  NEUTROABS  --  16.6* 16.1*  --   HGB 11.5* 13.2 13.2 12.2  HCT 36.6 41.9 40.9 38.2  MCV 77.4* 76.6* 75.2* 75.9*  PLT 560* 572* 661* 530*   Basic  Metabolic Panel: Recent Labs  Lab 09/27/18 1730 09/29/18 1450 09/30/18 1950 10/01/18 0244  NA 137 136 137 133*  K 3.7 2.9* 3.0* 4.0  CL 102 100 100 101  CO2 24 24 23 22   GLUCOSE 119* 98 104* 113*  BUN 7 16 17 11   CREATININE 0.83 0.91 0.94 0.78  CALCIUM 8.9 9.1 9.1 7.7*  MG  --   --  2.4  --    GFR: Estimated Creatinine Clearance: 98.5 mL/min (by C-G formula based on SCr of 0.78 mg/dL). Liver Function Tests: Recent Labs  Lab 09/27/18 1730 09/29/18 1450 09/30/18 1950  AST 26 17 19   ALT 17 14 13   ALKPHOS 68 61 60  BILITOT 0.4 0.6 1.0  PROT 9.0* 8.6* 8.6*  ALBUMIN 4.2 4.1 4.4   Recent Labs  Lab 09/27/18 1730 09/29/18 1450 09/30/18 1950  LIPASE 26 76* 37   No results for input(s): AMMONIA in the last 168 hours. Coagulation Profile: No results for input(s): INR, PROTIME in the last 168 hours. Cardiac Enzymes: No results for input(s): CKTOTAL, CKMB, CKMBINDEX, TROPONINI in the last 168 hours. BNP (last 3 results) No results for input(s): PROBNP in the last 8760 hours. HbA1C: No results for input(s): HGBA1C in the last 72 hours. CBG: No results for input(s): GLUCAP in the last 168 hours. Lipid Profile: No results for input(s): CHOL, HDL, LDLCALC, TRIG, CHOLHDL, LDLDIRECT in the last 72 hours. Thyroid Function Tests: No results for input(s): TSH, T4TOTAL, FREET4, T3FREE, THYROIDAB in the last 72 hours. Anemia Panel: No results for input(s): VITAMINB12, FOLATE, FERRITIN, TIBC, IRON, RETICCTPCT in the last 72 hours. Urine analysis:    Component Value Date/Time   COLORURINE YELLOW 09/30/2018 2237   APPEARANCEUR HAZY (A) 09/30/2018 2237   LABSPEC 1.025 09/30/2018 2237   PHURINE 6.0 09/30/2018 2237   GLUCOSEU NEGATIVE 09/30/2018 2237   HGBUR LARGE (A) 09/30/2018 2237   BILIRUBINUR NEGATIVE 09/30/2018 2237   KETONESUR 80 (A) 09/30/2018 2237   PROTEINUR 100 (A) 09/30/2018 2237   UROBILINOGEN 0.2 06/08/2015 0736   NITRITE NEGATIVE 09/30/2018 2237   LEUKOCYTESUR  NEGATIVE 09/30/2018 2237   Sepsis Labs: @LABRCNTIP (procalcitonin:4,lacticidven:4) ) Recent Results (from the past 240 hour(s))  Wet prep, genital     Status: Abnormal   Collection Time: 09/29/18  3:10 PM  Result Value Ref Range Status   Yeast Wet Prep HPF POC NONE SEEN NONE SEEN Final   Trich, Wet Prep NONE SEEN NONE SEEN Final   Clue Cells Wet Prep HPF POC NONE SEEN NONE SEEN Final   WBC, Wet Prep HPF POC FEW (A) NONE SEEN Final   Sperm NONE SEEN  Final    Comment: Performed at [facility], [place] [address] [place], [place] [address]  Urine culture     Status: Abnormal   Collection Time: 09/29/18  3:58 PM  Result Value Ref Range Status   Specimen  Description   Final    URINE, CLEAN CATCH Performed at [facility], [place] [address] [place], [place] [address]    Special Requests   Final    Normal Performed at [facility], [place] [address], [place], [place] [address]    Culture >=100,000 COLONIES/mL LACTOBACILLUS SPECIES (A)  Final   Report Status 10/01/2018 FINAL  Final         Radiology Studies: No results found.    Scheduled Meds: . cephALEXin  500 mg Oral QID  . enoxaparin (LOVENOX) injection  40 mg Subcutaneous Daily  . famotidine  20 mg Oral BID  . labetalol  200 mg Oral BID  . metoCLOPramide (REGLAN) injection  5 mg Intravenous Q8H   Continuous Infusions: . sodium chloride 125 mL/hr at 10/01/18 0903     LOS: 0 days    Time spent in minutes: 35    Debbe Odea, MD [facility] Pager: [email or web address] Password [email or web address] 10/01/2018, 3:40 PM

## 2018-10-02 ENCOUNTER — Telehealth: Payer: Self-pay

## 2018-10-02 LAB — CBC
HCT: 36.4 % (ref 36.0–46.0)
Hemoglobin: 11.5 g/dL — ABNORMAL LOW (ref 12.0–15.0)
MCH: 24.1 pg — AB (ref 26.0–34.0)
MCHC: 31.6 g/dL (ref 30.0–36.0)
MCV: 76.2 fL — AB (ref 80.0–100.0)
PLATELETS: 505 10*3/uL — AB (ref 150–400)
RBC: 4.78 MIL/uL (ref 3.87–5.11)
RDW: 15.5 % (ref 11.5–15.5)
WBC: 14.7 10*3/uL — ABNORMAL HIGH (ref 4.0–10.5)
nRBC: 0 % (ref 0.0–0.2)

## 2018-10-02 LAB — BASIC METABOLIC PANEL
Anion gap: 11 (ref 5–15)
BUN: 7 mg/dL (ref 6–20)
CO2: 24 mmol/L (ref 22–32)
Calcium: 8.5 mg/dL — ABNORMAL LOW (ref 8.9–10.3)
Chloride: 101 mmol/L (ref 98–111)
Creatinine, Ser: 0.71 mg/dL (ref 0.44–1.00)
GFR calc Af Amer: >60 mL/min (ref >60)
Glucose, Bld: 103 mg/dL — ABNORMAL HIGH (ref 70–99)
POTASSIUM: 3 mmol/L — AB (ref 3.5–5.1)
Sodium: 136 mmol/L (ref 135–145)

## 2018-10-02 LAB — MAGNESIUM: Magnesium: 2.2 mg/dL (ref 1.7–2.4)

## 2018-10-02 LAB — URINE CULTURE

## 2018-10-02 MED ORDER — POTASSIUM CHLORIDE 10 MEQ/100ML IV SOLN
10.0000 meq | INTRAVENOUS | Status: AC
  Administered 2018-10-02 (×6): 10 meq via INTRAVENOUS
  Filled 2018-10-02 (×5): qty 100

## 2018-10-02 MED ORDER — MORPHINE SULFATE (PF) 2 MG/ML IV SOLN
2.0000 mg | INTRAVENOUS | Status: DC | PRN
  Administered 2018-10-02: 2 mg via INTRAVENOUS
  Filled 2018-10-02: qty 1

## 2018-10-02 NOTE — Telephone Encounter (Signed)
NO treatment for  [facility] ED 09/29/18  Admitted to Hospital per [facility] D

## 2018-10-02 NOTE — Progress Notes (Signed)
PROGRESS NOTE    Frances Mccormick   ZWC:[id number]  DOB: 21-May-1986  DOA: 09/30/2018 PCP: Patient, No Pcp Per   Brief Narrative:  Frances Mccormick  is a 32 y.o. female with medical history significant for migraine headaches,  marijuana abuse, who presents with intractable nausea vomiting starting about 5 days prior to admission with more than 20 times of non-biliary nonbloody vomiting. She previously carried the diagnosis of Crohn's disease but it was confirmed by GI, Dr Lelon Frohlich on 01/08/15 that she does not have this diagnosis. She had a pilonidal abscess and  I&D on Monday in the ED. She took Keflex and Bactrim, but vomiting got worse and she developed diarrhea. Diarehea has since resolved.  Of note, patient had similar issue with intractable nausea vomiting in the past. She was admitted to the hospital 9/26-9/27.She had MRI of the abdomen in that admissin, which did not show acute appendicitis or any evidence of active Crohn's colitis. Found to have WBC 20.6 & lipase 76. CT abd/pelvis on 10/30> 1. No acute intra-abdominal nor pelvic abnormality. 2. Mild inflammatory change along the medial aspect of the left buttock along the natal cleft without abscess.  Last admitted from 07/26/18 - 07/27/18 for intractable nausea/ vomiting. Also found to have a UTI, Trichomoniasis, bacterial vaginosis & possibly early pregnancy (urine preg test + but no fetus on ultrasound)  Subjective: Only mildly better. Cannot keep down drinks still.    Assessment & Plan:   Principal Problem:   Intractable nausea and vomiting  - PUD? She takes Naproxen as outpt - add Pepcid and Protonix, try GI cocktail -  cont TID Reglan, PRN Zofran - advance to full liquids  Active Problems:  Hypokalemia - replacing via IV    Leukocytosis   Pilonidal abscess - on Keflex- CT showed no abscess- cont Keflex - change packing daily - WBC improving  Mild hyponatremia - cont IVF     Hypertension  - Labetalol, PRN  Hydralazine  Opiates and Marijuana + on UDS - received Opiates in ED which could accout for what is seen on UDS - counseled on Marijuana use   DVT prophylaxis: Lovenox Code Status: Full code Family Communication:  Disposition Plan: home when able to tolerate solids Consultants:   none Procedures:   none Antimicrobials:  Anti-infectives (From admission, onward)   Start     Dose/Rate Route Frequency Ordered Stop   09/30/18 2230  cephALEXin (KEFLEX) capsule 500 mg     500 mg Oral 4 times daily 09/30/18 2152         Objective: Vitals:   10/01/18 1054 10/01/18 1504 10/01/18 2154 10/02/18 0612  BP: 119/78 (!) 163/94 113/80 (!) 157/106  Pulse: 83 82 97 81  Resp:  16 16 16   Temp: 98.2 F (36.8 C) 99.1 F (37.3 C) 99.5 F (37.5 C) 99.2 F (37.3 C)  TempSrc: Oral  Oral Oral  SpO2: 100% 100% 100% 100%  Weight:      Height:        Intake/Output Summary (Last 24 hours) at 10/02/2018 1138 Last data filed at 10/02/2018 0200 Gross per 24 hour  Intake 2029.5 ml  Output 900 ml  Net 1129.5 ml   Filed Weights   09/30/18 1736  Weight: 86.2 kg    Examination: General exam: Appears comfortable  HEENT: PERRLA, oral mucosa moist, no sclera icterus or thrush Respiratory system: Clear to auscultation. Respiratory effort normal. Cardiovascular system: S1 & S2 heard,  No murmurs  Gastrointestinal system: Abdomen  soft,  Tender across upper abdomen, nondistended. Normal bowel sound. No organomegaly Central nervous system: Alert and oriented. No focal neurological deficits. Extremities: No cyanosis, clubbing or edema Skin: abscess examined- packing in place- not much discharge Psychiatry:  Mood & affect appropriate.     Data Reviewed: I have personally reviewed following labs and imaging studies  CBC: Recent Labs  Lab 09/27/18 1730 09/29/18 1050 09/30/18 1950 10/01/18 0244 10/02/18 0339  WBC 19.2* 19.9* 20.6* 19.1* 14.7*  NEUTROABS  --  16.6* 16.1*  --   --   HGB 11.5*  13.2 13.2 12.2 11.5*  HCT 36.6 41.9 40.9 38.2 36.4  MCV 77.4* 76.6* 75.2* 75.9* 76.2*  PLT 560* 572* 661* 530* 675*   Basic Metabolic Panel: Recent Labs  Lab 09/27/18 1730 09/29/18 1450 09/30/18 1950 10/01/18 0244 10/02/18 0339  NA 137 136 137 133* 136  K 3.7 2.9* 3.0* 4.0 3.0*  CL 102 100 100 101 101  CO2 24 24 23 22 24   GLUCOSE 119* 98 104* 113* 103*  BUN 7 16 17 11 7   CREATININE 0.83 0.91 0.94 0.78 0.71  CALCIUM 8.9 9.1 9.1 7.7* 8.5*  MG  --   --  2.4  --   --    GFR: Estimated Creatinine Clearance: 98.5 mL/min (by C-G formula based on SCr of 0.71 mg/dL). Liver Function Tests: Recent Labs  Lab 09/27/18 1730 09/29/18 1450 09/30/18 1950  AST 26 17 19   ALT 17 14 13   ALKPHOS 68 61 60  BILITOT 0.4 0.6 1.0  PROT 9.0* 8.6* 8.6*  ALBUMIN 4.2 4.1 4.4   Recent Labs  Lab 09/27/18 1730 09/29/18 1450 09/30/18 1950  LIPASE 26 76* 37   No results for input(s): AMMONIA in the last 168 hours. Coagulation Profile: No results for input(s): INR, PROTIME in the last 168 hours. Cardiac Enzymes: No results for input(s): CKTOTAL, CKMB, CKMBINDEX, TROPONINI in the last 168 hours. BNP (last 3 results) No results for input(s): PROBNP in the last 8760 hours. HbA1C: No results for input(s): HGBA1C in the last 72 hours. CBG: No results for input(s): GLUCAP in the last 168 hours. Lipid Profile: No results for input(s): CHOL, HDL, LDLCALC, TRIG, CHOLHDL, LDLDIRECT in the last 72 hours. Thyroid Function Tests: No results for input(s): TSH, T4TOTAL, FREET4, T3FREE, THYROIDAB in the last 72 hours. Anemia Panel: No results for input(s): VITAMINB12, FOLATE, FERRITIN, TIBC, IRON, RETICCTPCT in the last 72 hours. Urine analysis:    Component Value Date/Time   COLORURINE YELLOW 09/30/2018 2237   APPEARANCEUR HAZY (A) 09/30/2018 2237   LABSPEC 1.025 09/30/2018 2237   PHURINE 6.0 09/30/2018 2237   GLUCOSEU NEGATIVE 09/30/2018 2237   HGBUR LARGE (A) 09/30/2018 2237   BILIRUBINUR  NEGATIVE 09/30/2018 2237   KETONESUR 80 (A) 09/30/2018 2237   PROTEINUR 100 (A) 09/30/2018 2237   UROBILINOGEN 0.2 06/08/2015 0736   NITRITE NEGATIVE 09/30/2018 2237   LEUKOCYTESUR NEGATIVE 09/30/2018 2237   Sepsis Labs: @LABRCNTIP (procalcitonin:4,lacticidven:4) ) Recent Results (from the past 240 hour(s))  Wet prep, genital     Status: Abnormal   Collection Time: 09/29/18  3:10 PM  Result Value Ref Range Status   Yeast Wet Prep HPF POC NONE SEEN NONE SEEN Final   Trich, Wet Prep NONE SEEN NONE SEEN Final   Clue Cells Wet Prep HPF POC NONE SEEN NONE SEEN Final   WBC, Wet Prep HPF POC FEW (A) NONE SEEN Final   Sperm NONE SEEN  Final    Comment: Performed at [facility]  [place] [address], [place], [place] [address]  Urine culture     Status: Abnormal   Collection Time: 09/29/18  3:58 PM  Result Value Ref Range Status   Specimen Description   Final    URINE, CLEAN CATCH Performed at [facility], [place] [address], [place], [place] [address]    Special Requests   Final    Normal Performed at [facility], [place] [address] [place], [place] [address]    Culture >=100,000 COLONIES/mL LACTOBACILLUS SPECIES (A)  Final   Report Status 10/01/2018 FINAL  Final  Urine Culture     Status: None (Preliminary result)   Collection Time: 09/30/18 10:37 PM  Result Value Ref Range Status   Specimen Description   Final    URINE, CLEAN CATCH Performed at [facility], [place] [address] [place], [place] [address]    Special Requests   Final    NONE Performed at [facility], [place] [address], [place], [place] [address]    Culture   Final    CULTURE REINCUBATED FOR BETTER GROWTH Performed at [facility], [place] [address] [place], [place] [address]    Report Status PENDING  Incomplete  Culture, blood (x 2)     Status: None (Preliminary result)   Collection Time: 09/30/18 10:47 PM  Result Value Ref  Range Status   Specimen Description   Final    BLOOD RIGHT ANTECUBITAL Performed at [place] [address], [place], [place] [address]    Special Requests   Final    BOTTLES DRAWN AEROBIC AND ANAEROBIC Blood Culture adequate volume Performed at [place] [address] [place], [place] [address]    Culture   Final    NO GROWTH 1 DAY Performed at [facility], [place] [address], [place], [place] [address]    Report Status PENDING  Incomplete  Culture, blood (x 2)     Status: None (Preliminary result)   Collection Time: 10/01/18  2:44 AM  Result Value Ref Range Status   Specimen Description   Final    BLOOD LEFT HAND Performed at [place] [address], [place], [place] [address]    Special Requests   Final    BOTTLES DRAWN AEROBIC AND ANAEROBIC Blood Culture adequate volume Performed at [place] [address], [place],  [address]    Culture   Final    NO GROWTH 1 DAY Performed at [facility], [place] [address] [place],  [address]    Report Status PENDING  Incomplete         Radiology Studies: No results found.    Scheduled Meds: . cephALEXin  500 mg Oral QID  . enoxaparin (LOVENOX) injection  40 mg Subcutaneous Daily  . famotidine  20 mg Oral BID  . labetalol  200 mg Oral BID  . metoCLOPramide (REGLAN) injection  5 mg Intravenous Q8H  . pantoprazole  40 mg Oral Daily   Continuous Infusions: . sodium chloride 125 mL/hr at 10/02/18 0736  . potassium chloride 10 mEq (10/02/18 1011)     LOS: 1 day    Time spent in minutes: 35    Debbe Odea, MD [facility] Pager: [email or web address] Password [facility] 10/02/2018, 11:38 AM

## 2018-10-03 LAB — BASIC METABOLIC PANEL
Anion gap: 9 (ref 5–15)
BUN: 8 mg/dL (ref 6–20)
CHLORIDE: 104 mmol/L (ref 98–111)
CO2: 25 mmol/L (ref 22–32)
CREATININE: 0.73 mg/dL (ref 0.44–1.00)
Calcium: 8.6 mg/dL — ABNORMAL LOW (ref 8.9–10.3)
GFR calc Af Amer: >60 mL/min (ref >60)
GFR calc non Af Amer: >60 mL/min (ref >60)
GLUCOSE: 83 mg/dL (ref 70–99)
Potassium: 3.3 mmol/L — ABNORMAL LOW (ref 3.5–5.1)
SODIUM: 138 mmol/L (ref 135–145)

## 2018-10-03 LAB — CBC
HCT: 34.7 % — ABNORMAL LOW (ref 36.0–46.0)
Hemoglobin: 10.8 g/dL — ABNORMAL LOW (ref 12.0–15.0)
MCH: 23.8 pg — ABNORMAL LOW (ref 26.0–34.0)
MCHC: 31.1 g/dL (ref 30.0–36.0)
MCV: 76.6 fL — AB (ref 80.0–100.0)
Platelets: 429 10*3/uL — ABNORMAL HIGH (ref 150–400)
RBC: 4.53 MIL/uL (ref 3.87–5.11)
RDW: 15.7 % — AB (ref 11.5–15.5)
WBC: 14.6 10*3/uL — ABNORMAL HIGH (ref 4.0–10.5)
nRBC: 0 % (ref 0.0–0.2)

## 2018-10-03 MED ORDER — FAMOTIDINE 20 MG PO TABS: 20.0000 mg | ORAL_TABLET | Freq: Two times a day (BID) | ORAL | 0 refills | Status: DC

## 2018-10-03 MED ORDER — PROMETHAZINE HCL 25 MG RE SUPP: 25.0000 mg | Freq: Four times a day (QID) | RECTAL | 0 refills | Status: DC | PRN

## 2018-10-03 MED ORDER — PROMETHAZINE HCL 25 MG PO TABS: 12.5000 mg | ORAL_TABLET | Freq: Every evening | ORAL | 0 refills | Status: DC | PRN

## 2018-10-03 MED ORDER — POTASSIUM CHLORIDE ER 20 MEQ PO TBCR: 20.0000 meq | EXTENDED_RELEASE_TABLET | Freq: Every day | ORAL | 0 refills | Status: DC

## 2018-10-03 MED ORDER — ONDANSETRON HCL 4 MG PO TABS: 4.0000 mg | ORAL_TABLET | Freq: Three times a day (TID) | ORAL | Status: DC | PRN

## 2018-10-03 MED ORDER — POTASSIUM CHLORIDE 10 MEQ/100ML IV SOLN: 10.0000 meq | INTRAVENOUS | Status: AC

## 2018-10-03 MED ORDER — POTASSIUM CHLORIDE CRYS ER 20 MEQ PO TBCR
40.0000 meq | EXTENDED_RELEASE_TABLET | ORAL | Status: AC
  Administered 2018-10-03 (×2): 40 meq via ORAL
  Filled 2018-10-03 (×2): qty 2

## 2018-10-03 MED ORDER — CEPHALEXIN 500 MG PO CAPS: 500.0000 mg | ORAL_CAPSULE | Freq: Four times a day (QID) | ORAL | 0 refills | Status: DC

## 2018-10-03 MED ORDER — METOCLOPRAMIDE HCL 5 MG PO TABS
5.0000 mg | ORAL_TABLET | Freq: Three times a day (TID) | ORAL | Status: DC
  Administered 2018-10-03 (×2): 5 mg via ORAL
  Filled 2018-10-03 (×2): qty 1

## 2018-10-03 MED ORDER — METOCLOPRAMIDE HCL 10 MG PO TABS: 10.0000 mg | ORAL_TABLET | Freq: Three times a day (TID) | ORAL | 0 refills | Status: DC | PRN

## 2018-10-03 MED ORDER — ONDANSETRON 4 MG PO TBDP: 4.0000 mg | ORAL_TABLET | Freq: Four times a day (QID) | ORAL | 0 refills | Status: DC | PRN

## 2018-10-03 MED ORDER — POTASSIUM CHLORIDE CRYS ER 20 MEQ PO TBCR: 40.0000 meq | EXTENDED_RELEASE_TABLET | Freq: Every day | ORAL | 0 refills | Status: DC

## 2018-10-03 NOTE — Progress Notes (Addendum)
Pt's mother instructed on dsg change. Verbalized understanding. Evelise Reine, [employer]

## 2018-10-03 NOTE — Discharge Summary (Signed)
Physician Discharge Summary  LONEY PETO STM:[id number] DOB: 12-02-1985 DOA: 09/30/2018  PCP: Patient, No Pcp Per- needs to call Medicaid dept to see who her PCP is  Admit date: 09/30/2018 Discharge date: 10/03/2018  Admitted From: home  Disposition:  home   Recommendations for Outpatient Follow-up:  1. F/u Bmet for hypokalemia, f/u BP    Discharge Condition:  stable  CODE STATUS:  Full code   Consultations:  none    Discharge Diagnoses:  Principal Problem:   Intractable nausea and vomiting Active Problems:  hypotension   Hypertension   Chronic Hypokalemia   Leukocytosis   Pilonidal abscess     Brief Summary: Brief Narrative:  IMOGEN MADDALENA is a 32 y.o.femalewith medical history significant for migraine headaches,  marijuana abuse, who presents with intractable nausea vomiting starting about 5 days prior to admission with more than 20 times of non-biliary nonbloody vomiting. She previously carried the diagnosis of Crohn's disease but it was confirmed by GI, Dr Lelon Frohlich on 01/08/15 that she does not have this diagnosis. She had a pilonidal abscess and  I&D on Monday in the ED. Shetook Keflex and Bactrim, but vomiting got worse and she developed diarrhea. Diarehea has since resolved.  Of note, patient had similar issuewithintractable nausea vomiting in the past.She wasadmitted to the hospital 9/26-9/27.She hadMRI of the abdomen in that admissin, whichdid not show acute appendicitis or any evidence of active Crohn's colitis. Found to haveWBC 20.6 & lipase 76. CT abd/pelvis on 10/30> 1. No acute intra-abdominal nor pelvic abnormality. 2. Mild inflammatory change along the medial aspect of the left buttock along the natal cleft without abscess.  Last admitted from 07/26/18 - 07/27/18 for intractable nausea/ vomiting. Also found to have a UTI, Trichomoniasis, bacterial vaginosis & possibly early pregnancy (urine preg test + but no fetus on ultrasound)  Intractable nausea  and vomiting - added Pepcid and Protonix, Gi cocktail TID Reglan, PRN Zofran - advanced to a solid diet today- given refills on medication and advised to take a bland diet  Hypokalemia - replacing - able to take oral KCL today    Leukocytosis   Pilonidal abscess - on Keflex- CT showed no abscess- cont Keflex on which leukocytosis is improving - change packing daily- instructions given by RN   Mild hyponatremia - improved on  IVF     Hypotenstion with h/o Hypertension  - Labetalol has been ordered but her BP has dropped to 90-100systolic- she has not been receiving pain medications and has been receiving continuous IVF to prevent dehydration - will recommend Labetalol be held for now and she check BP regularly at home- resume if elevated -  PRN Hydralazine  Opiates and Marijuana + on UDS - received Opiates in ED which could accout for what is seen on UDS - counseled on Marijuana use     Discharge Exam: Vitals:   10/03/18 1428 10/03/18 1503  BP: (!) 97/54 (!) 104/56  Pulse: 74 72  Resp: 16   Temp: 98.3 F (36.8 C)   SpO2: 100%    Vitals:   10/02/18 2220 10/03/18 0527 10/03/18 1428 10/03/18 1503  BP: (!) 156/96 111/61 (!) 97/54 (!) 104/56  Pulse: 90 64 74 72  Resp: 16 16 16    Temp: 98.7 F (37.1 C) 98 F (36.7 C) 98.3 F (36.8 C)   TempSrc: Oral Oral Oral   SpO2: 100% 100% 100%   Weight:      Height:        General: Pt  is alert, awake, not in acute distress Cardiovascular: RRR, S1/S2 +, no rubs, no gallops Respiratory: CTA bilaterally, no wheezing, no rhonchi Abdominal: Soft, NT, ND, bowel sounds + Extremities: no edema, no cyanosis   Discharge Instructions   Allergies as of 10/03/2018      Reactions   Bee Venom Anaphylaxis   Ciprofloxacin Anaphylaxis      Medication List    STOP taking these medications   labetalol 200 MG tablet Commonly known as:  NORMODYNE   naproxen 375 MG tablet Commonly known as:  NAPROSYN    sulfamethoxazole-trimethoprim 800-160 MG tablet Commonly known as:  BACTRIM DS,SEPTRA DS     TAKE these medications   acetaminophen 325 MG tablet Commonly known as:  TYLENOL Take 650 mg by mouth every 6 (six) hours as needed for mild pain.   cephALEXin 500 MG capsule Commonly known as:  KEFLEX Take 1 capsule (500 mg total) by mouth 4 (four) times daily.   famotidine 20 MG tablet Commonly known as:  PEPCID Take 1 tablet (20 mg total) by mouth 2 (two) times daily.   HYDROcodone-acetaminophen 5-325 MG tablet Commonly known as:  NORCO/VICODIN Take 1 tablet by mouth every 6 (six) hours as needed for severe pain.   metoCLOPramide 10 MG tablet Commonly known as:  REGLAN Take 1 tablet (10 mg total) by mouth every 8 (eight) hours as needed for nausea.   ondansetron 4 MG disintegrating tablet Commonly known as:  ZOFRAN-ODT Take 1 tablet (4 mg total) by mouth 4 (four) times daily as needed for nausea or vomiting. What changed:  when to take this   potassium chloride SA 20 MEQ tablet Commonly known as:  K-DUR,KLOR-CON Take 2 tablets (40 mEq total) by mouth daily for 4 days.   promethazine 25 MG suppository Commonly known as:  PHENERGAN Place 1 suppository (25 mg total) rectally every 6 (six) hours as needed for nausea or vomiting.   promethazine 25 MG tablet Commonly known as:  PHENERGAN Take 0.5-1 tablets (12.5-25 mg total) by mouth at bedtime as needed for nausea.       Allergies  Allergen Reactions  . Bee Venom Anaphylaxis  . Ciprofloxacin Anaphylaxis     Procedures/Studies:    Ct Abdomen Pelvis Wo Contrast  Result Date: 09/29/2018 CLINICAL DATA:  Patient was on antibiotics for pilonidal cyst but developed nausea, vomiting and lower abdominal cramping. Patient discontinued antibiotics and has persistent nausea, vomiting and abdominal cramping. EXAM: CT ABDOMEN AND PELVIS WITHOUT CONTRAST TECHNIQUE: Multidetector CT imaging of the abdomen and pelvis was performed  following the standard protocol without IV contrast. COMPARISON:  None. FINDINGS: LOWER CHEST: Lung bases are clear. Included heart size is normal. No pericardial effusion. HEPATOBILIARY: Liver and gallbladder are normal. PANCREAS: Normal. SPLEEN: Normal. ADRENALS/URINARY TRACT: Kidneys are orthotopic, demonstrating symmetric enhancement. No nephrolithiasis, hydronephrosis or solid renal masses. Urinary bladder is partially distended and unremarkable. Normal adrenal glands. STOMACH/BOWEL: The stomach, small and large bowel are normal in course and caliber without inflammatory changes. Normal appendix. VASCULAR/LYMPHATIC: Aortoiliac vessels are normal in course and caliber. No lymphadenopathy by CT size criteria. REPRODUCTIVE: Uterus and adnexa are unremarkable. OTHER: No intraperitoneal free fluid or free air. Minimal inflammatory change along the medial aspect of the left buttock along the natal cleft may represent site of pilonidal cyst. MUSCULOSKELETAL: Nonacute. IMPRESSION: 1. No acute intra-abdominal nor pelvic abnormality. 2. Mild inflammatory change along the medial aspect of the left buttock along the natal cleft without abscess. Electronically Signed   By: Shanon Brow  Randel Pigg M.D.   On: 09/29/2018 14:33     The results of significant diagnostics from this hospitalization (including imaging, microbiology, ancillary and laboratory) are listed below for reference.     Microbiology: Recent Results (from the past 240 hour(s))  Wet prep, genital     Status: Abnormal   Collection Time: 09/29/18  3:10 PM  Result Value Ref Range Status   Yeast Wet Prep HPF POC NONE SEEN NONE SEEN Final   Trich, Wet Prep NONE SEEN NONE SEEN Final   Clue Cells Wet Prep HPF POC NONE SEEN NONE SEEN Final   WBC, Wet Prep HPF POC FEW (A) NONE SEEN Final   Sperm NONE SEEN  Final    Comment: Performed at [facility], [place] [address] [place], [place] [address]  Urine culture     Status: Abnormal   Collection  Time: 09/29/18  3:58 PM  Result Value Ref Range Status   Specimen Description   Final    URINE, CLEAN CATCH Performed at [facility], [place] [address] [place], [place] [address]    Special Requests   Final    Normal Performed at [facility], [place] [address], [place], [place] [address]    Culture >=100,000 COLONIES/mL LACTOBACILLUS SPECIES (A)  Final   Report Status 10/01/2018 FINAL  Final  Urine Culture     Status: Abnormal   Collection Time: 09/30/18 10:37 PM  Result Value Ref Range Status   Specimen Description   Final    URINE, CLEAN CATCH Performed at [facility], [place] [address] [place] [address], [place], [place] [address]    Special Requests   Final    NONE Performed at [facility], [place] [address], [place], [place] [address]    Culture 80,000 COLONIES/mL LACTOBACILLUS SPECIES (A)  Final   Report Status 10/02/2018 FINAL  Final  Culture, blood (x 2)     Status: None (Preliminary result)   Collection Time: 09/30/18 10:47 PM  Result Value Ref Range Status   Specimen Description   Final    BLOOD RIGHT ANTECUBITAL Performed at [place] [address], [place], [place] [address]    Special Requests   Final    BOTTLES DRAWN AEROBIC AND ANAEROBIC Blood Culture adequate volume Performed at [place] [address] [place], [place] [address]    Culture   Final    NO GROWTH 1 DAY Performed at [facility], [place] [address] [place], [place] [address]    Report Status PENDING  Incomplete  Culture, blood (x 2)     Status: None (Preliminary result)   Collection Time: 10/01/18  2:44 AM  Result Value Ref Range Status   Specimen Description   Final    BLOOD LEFT HAND Performed at [place] [address] [place], [place] [address]    Special Requests   Final    BOTTLES DRAWN AEROBIC AND ANAEROBIC Blood Culture adequate volume Performed at [place] [address] [place], [place] [address]    Culture   Final    NO GROWTH 1 DAY Performed at [facility], [place] [address] [place], [place] [address]    Report Status PENDING  Incomplete     Labs: BNP (last 3 results) No results for input(s): BNP in the last 8760 hours. Basic Metabolic Panel: Recent Labs  Lab 09/29/18 1450 09/30/18 1950 10/01/18 0244 10/02/18 0339 10/03/18  0443  NA 136 137 133* 136 138  K 2.9* 3.0* 4.0 3.0* 3.3*  CL 100 100 101 101 104  CO2 24 23 22 24 25   GLUCOSE 98 104* 113* 103* 83  BUN 16 17 11 7 8   CREATININE 0.91 0.94 0.78 0.71 0.73  CALCIUM 9.1 9.1 7.7* 8.5* 8.6*  MG  --  2.4  --  2.2  --    Liver Function Tests: Recent Labs  Lab 09/27/18 1730 09/29/18 1450 09/30/18 1950  AST 26 17 19   ALT 17 14 13   ALKPHOS 68 61 60  BILITOT 0.4 0.6 1.0  PROT 9.0* 8.6* 8.6*  ALBUMIN 4.2 4.1 4.4   Recent Labs  Lab 09/27/18 1730 09/29/18 1450 09/30/18 1950  LIPASE 26 76* 37   No results for input(s): AMMONIA in the last 168 hours. CBC: Recent Labs  Lab 09/29/18 1050 09/30/18 1950 10/01/18 0244 10/02/18 0339 10/03/18 0443  WBC 19.9* 20.6* 19.1* 14.7* 14.6*  NEUTROABS 16.6* 16.1*  --   --   --   HGB 13.2 13.2 12.2 11.5* 10.8*  HCT 41.9 40.9 38.2 36.4 34.7*  MCV 76.6* 75.2* 75.9* 76.2* 76.6*  PLT 572* 661* 530* 505* 429*   Cardiac Enzymes: No results for input(s): CKTOTAL, CKMB, CKMBINDEX, TROPONINI in the last 168 hours. BNP: Invalid input(s): POCBNP CBG: No results for input(s): GLUCAP in the last 168 hours. D-Dimer No results for input(s): DDIMER in the last 72 hours. Hgb A1c No results for input(s): HGBA1C in the last 72 hours. Lipid Profile No results for input(s): CHOL, HDL, LDLCALC, TRIG, CHOLHDL, LDLDIRECT in the last 72 hours. Thyroid function studies No results for input(s): TSH, T4TOTAL, T3FREE, THYROIDAB in the last 72 hours.  Invalid input(s): FREET3 Anemia work up No results for  input(s): VITAMINB12, FOLATE, FERRITIN, TIBC, IRON, RETICCTPCT in the last 72 hours. Urinalysis    Component Value Date/Time   COLORURINE YELLOW 09/30/2018 2237   APPEARANCEUR HAZY (A) 09/30/2018 2237   LABSPEC 1.025 09/30/2018 2237   PHURINE 6.0 09/30/2018 2237   GLUCOSEU NEGATIVE 09/30/2018 2237   HGBUR LARGE (A) 09/30/2018 2237   BILIRUBINUR NEGATIVE 09/30/2018 2237   KETONESUR 80 (A) 09/30/2018 2237   PROTEINUR 100 (A) 09/30/2018 2237   UROBILINOGEN 0.2 06/08/2015 0736   NITRITE NEGATIVE 09/30/2018 2237   LEUKOCYTESUR NEGATIVE 09/30/2018 2237   Sepsis Labs Invalid input(s): PROCALCITONIN,  WBC,  LACTICIDVEN Microbiology Recent Results (from the past 240 hour(s))  Wet prep, genital     Status: Abnormal   Collection Time: 09/29/18  3:10 PM  Result Value Ref Range Status   Yeast Wet Prep HPF POC NONE SEEN NONE SEEN Final   Trich, Wet Prep NONE SEEN NONE SEEN Final   Clue Cells Wet Prep HPF POC NONE SEEN NONE SEEN Final   WBC, Wet Prep HPF POC FEW (A) NONE SEEN Final   Sperm NONE SEEN  Final    Comment: Performed at [facility], [place] [address], [place], [place] [address]  Urine culture     Status: Abnormal   Collection Time: 09/29/18  3:58 PM  Result Value Ref Range Status   Specimen Description   Final    URINE, CLEAN CATCH Performed at [facility], [place] [address], [place], [place] [address]    Special Requests   Final    Normal Performed at [facility], [place] [address] [place], [place] [address]    Culture >=100,000 COLONIES/mL LACTOBACILLUS SPECIES (A)  Final  Report Status 10/01/2018 FINAL  Final  Urine Culture     Status: Abnormal   Collection Time: 09/30/18 10:37 PM  Result Value Ref Range Status   Specimen Description   Final    URINE, CLEAN CATCH Performed at [facility], [place] [address], [place], [place] [address]    Special Requests   Final    NONE Performed at [facility], [place] [address] [place], [place] [address]    Culture 80,000 COLONIES/mL LACTOBACILLUS SPECIES (A)  Final   Report Status 10/02/2018 FINAL  Final  Culture, blood (x 2)     Status: None (Preliminary result)   Collection Time: 09/30/18 10:47 PM  Result Value Ref Range Status   Specimen Description   Final    BLOOD RIGHT ANTECUBITAL Performed at [place] [address] [place], [place] [address]    Special Requests   Final    BOTTLES DRAWN AEROBIC AND ANAEROBIC Blood Culture adequate volume Performed at [place] [address] [place], [place] [address]    Culture   Final    NO GROWTH 1 DAY Performed at [facility], [place] [address], [place], [place] [address]    Report Status PENDING  Incomplete  Culture, blood (x 2)     Status: None (Preliminary result)   Collection Time: 10/01/18  2:44 AM  Result Value Ref Range Status   Specimen Description   Final    BLOOD LEFT HAND Performed at [place] [address] [place], [place] [address]    Special Requests   Final    BOTTLES DRAWN AEROBIC AND ANAEROBIC Blood Culture adequate volume Performed at [place] [address] [place] [address] [place], [place] [address]    Culture   Final    NO GROWTH 1 DAY Performed at [facility], [place] [address] [place], [place] [address]    Report Status PENDING  Incomplete     Time coordinating discharge in minutes: 65  SIGNED:   Debbe Odea, MD  [facility] 10/03/2018, 3:15 PM Pager   If 7PM-7AM, please contact night-coverage [email or web address] Password [email or web address]

## 2018-10-03 NOTE — Progress Notes (Signed)
Pt taken out via wheelchair. No change in assessment.  Julio Alm, RN

## 2018-10-03 NOTE — Discharge Instructions (Signed)
Wound care: Pack 1/4" Iodoform packing strip into the open wound, cover with folded gauze, tape in place Follow up for a wound check in 5-7 days.  Eat a soft diet without caffeine, fiber and spicy and oily food about 5 days and then slowly re-introduce these items.  Control constipation with Miralax or Dulcolax suppositories.  Your BP has been low. Do not take Labetalol for now. Check BP twice a day at the same time and keep a record. If the top number goes above 130 or the bottom number goes above 100, start your Labetalol again  Take Pepcid 2 x day for now to help control indigestion/ heartburn. If you still have symptoms after using Pepcid, can try. Prilosec daily.   You were cared for by a hospitalist during your hospital stay. If you have any questions about your discharge medications or the care you received while you were in the hospital after you are discharged, you can call the unit and asked to speak with the hospitalist on call if the hospitalist that took care of you is not available. Once you are discharged, your primary care physician will handle any further medical issues.   Please note that NO REFILLS for any discharge medications will be authorized once you are discharged, as it is imperative that you return to your primary care physician (or establish a relationship with a primary care physician if you do not have one) for your aftercare needs so that they can reassess your need for medications and monitor your lab values.  Please take all your medications with you for your next visit with your Primary MD. Please ask your Primary MD to get all Hospital records sent to his/her office. Please request your Primary MD to go over all hospital test results at the follow up.   If you experience worsening of your admission symptoms, develop shortness of breath, chest pain, suicidal or homicidal thoughts or a life threatening emergency, you must seek medical attention immediately by calling  [phone] or calling your MD.   Dennis Bast must read the complete instructions/literature along with all the possible adverse reactions/side effects for all the medicines you take including new medications that have been prescribed to you. Take new medicines after you have completely understood and accpet all the possible adverse reactions/side effects.    Do not drive when taking pain medications or sedatives.     Do not take more than prescribed Pain, Sleep and Anxiety Medications   If you have smoked or chewed Tobacco in the last 2 yrs please stop. Stop any regular alcohol  and or recreational drug use.   Wear Seat belts while driving.   Soft-Food Meal Plan A soft-food meal plan includes foods that are safe and easy to swallow. This meal plan typically is used:  If you are having trouble chewing or swallowing foods.  As a transition meal plan after only having had liquid meals for a long period.  What do I need to know about the soft-food meal plan? A soft-food meal plan includes tender foods that are soft and easy to chew and swallow. In most cases, bite-sized pieces of food are easier to swallow. A bite-sized piece is about  inch or smaller. Foods in this plan do not need to be ground or pureed. Foods that are very hard, crunchy, or sticky should be avoided. Also, breads, cereals, yogurts, and desserts with nuts, seeds, or fruits should be avoided. What foods can I eat? Grains Rice and wild  rice. Moist bread, dressing, pasta, and noodles. Well-moistened dry or cooked cereals, such as farina (cooked wheat cereal), oatmeal, or grits. Biscuits, breads, muffins, pancakes, and waffles that have been well moistened. Vegetables Shredded lettuce. Cooked, tender vegetables, including potatoes without skins. Vegetable juices. Broths or creamed soups made with vegetables that are not stringy or chewy. Strained tomatoes (without seeds). Fruits Canned or well-cooked fruits. Soft (ripe), peeled fresh fruits,  such as peaches, nectarines, kiwi, cantaloupe, honeydew melon, and watermelon (without seeds). Soft berries with small seeds, such as strawberries. Fruit juices (without pulp). Meats and Other Protein Sources Moist, tender, lean beef. Mutton. Lamb. Veal. Chicken. [place]. Liver. Ham. Fish without bones. Eggs. Dairy Milk, milk drinks, and cream. Plain cream cheese and cottage cheese. Plain yogurt. Sweets/Desserts Flavored gelatin desserts. Custard. Plain ice cream, frozen yogurt, sherbet, milk shakes, and malts. Plain cakes and cookies. Plain hard candy. Other Butter, margarine (without trans fat), and cooking oils. Mayonnaise. Cream sauces. Mild spices, salt, and sugar. Syrup, molasses, honey, and jelly. The items listed above may not be a complete list of recommended foods or beverages. Contact your dietitian for more options. What foods are not recommended? Grains Dry bread, toast, crackers that have not been moistened. Coarse or dry cereals, such as bran, granola, and shredded wheat. Tough or chewy crusty breads, such as [place] bread or baguettes. Vegetables Corn. Raw vegetables except shredded lettuce. Cooked vegetables that are tough or stringy. Tough, crisp, fried potatoes and potato skins. Fruits Fresh fruits with skins or seeds or both, such as apples, pears, or grapes. Stringy, high-pulp fruits, such as papaya, pineapple, coconut, or mango. Fruit leather, fruit roll-ups, and all dried fruits. Meats and Other Protein Sources Sausages and hot dogs. Meats with gristle. Fish with bones. Nuts, seeds, and chunky peanut or other nut butters. Sweets/Desserts Cakes or cookies that are very dry or chewy. The items listed above may not be a complete list of foods and beverages to avoid. Contact your dietitian for more information. This information is not intended to replace advice given to you by your health care provider. Make sure you discuss any questions you have with your health care  provider. Document Released: 02/24/2008 Document Revised: 04/24/2016 Document Reviewed: 10/14/2013 Elsevier Interactive Patient Education  2017 [employer].

## 2018-10-04 ENCOUNTER — Telehealth: Payer: Self-pay

## 2018-10-04 NOTE — Telephone Encounter (Signed)
Message received from Haze Justin, RN CM requesting a hospital follow up appointment for the patient at [facility]. Attempted to contact the patient. Call placed to # [phone] and a message was left requesting a call back to this CM # [phone]

## 2018-10-06 LAB — CULTURE, BLOOD (ROUTINE X 2)
Culture: NO GROWTH
Culture: NO GROWTH
Special Requests: ADEQUATE
Special Requests: ADEQUATE

## 2019-08-15 IMAGING — CT CT ABD-PELV W/O CM
2 of 4 series · 16 of 46 positions shown, 18 images · non-contrast
Comparison: None.

CLINICAL DATA: Patient was on antibiotics for pilonidal cyst but
developed nausea, vomiting and lower abdominal cramping. Patient
discontinued antibiotics and has persistent nausea, vomiting and
abdominal cramping.

EXAM:
CT ABDOMEN AND PELVIS WITHOUT CONTRAST
TECHNIQUE: Multidetector CT imaging of the abdomen and pelvis was performed
following the standard protocol without IV contrast.

[Series 2: axial st · axial · 0.65mm/px · z∈[-432,-36]mm · 13 of 89 slices shown, 15 images]
[im 5/89  soft-tissue]
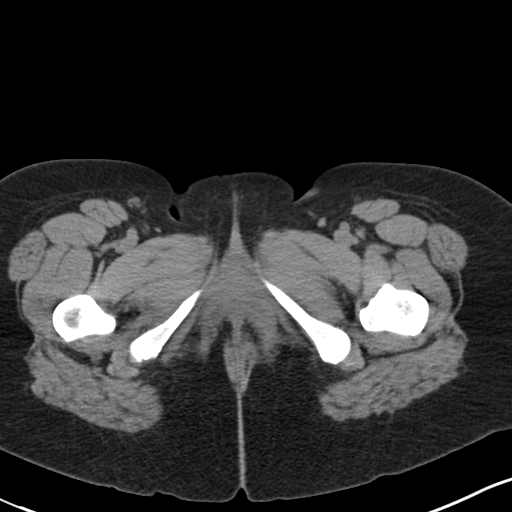
[im 5/89  bone]
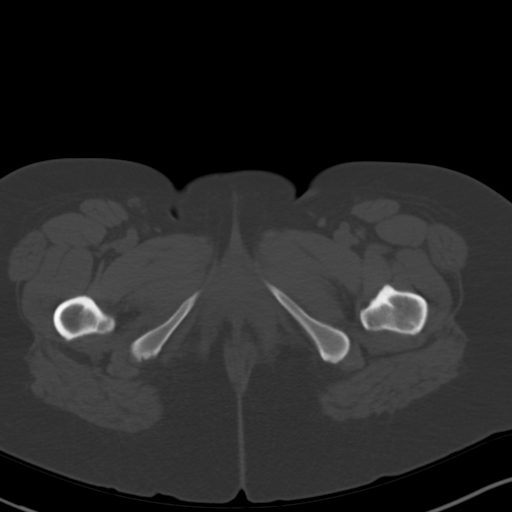
[im 14/89  soft-tissue]
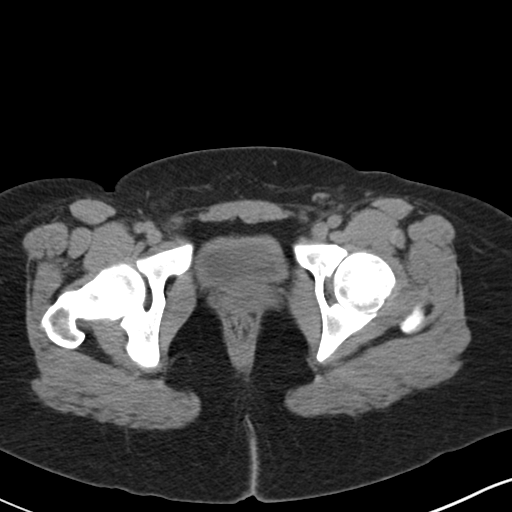
[im 19/89  soft-tissue]
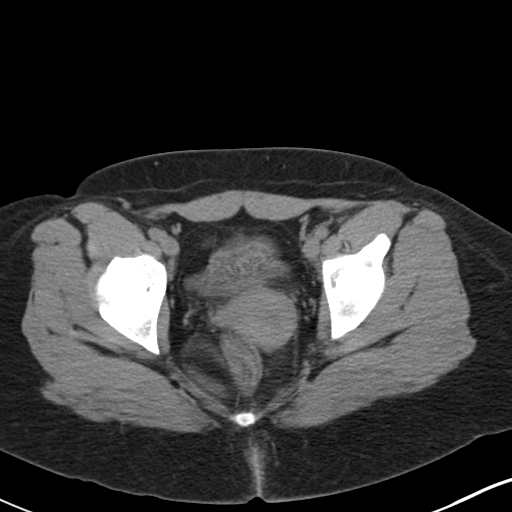
[im 24/89  soft-tissue]
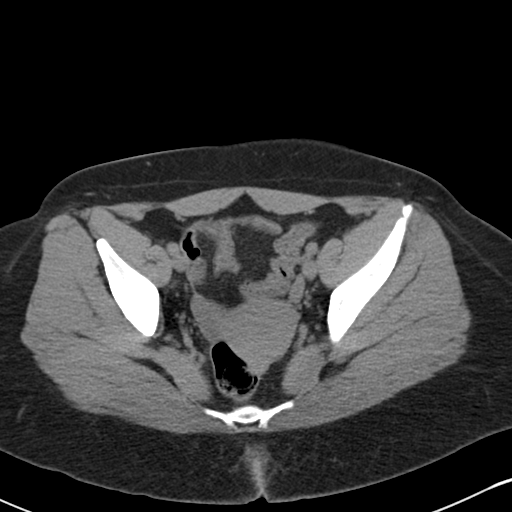
[im 33/89  soft-tissue]
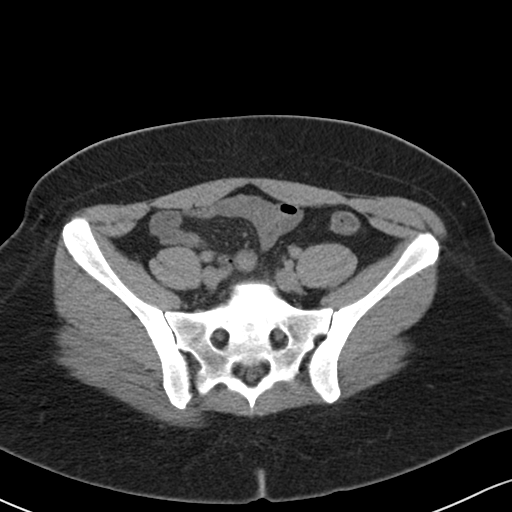
[im 38/89  soft-tissue]
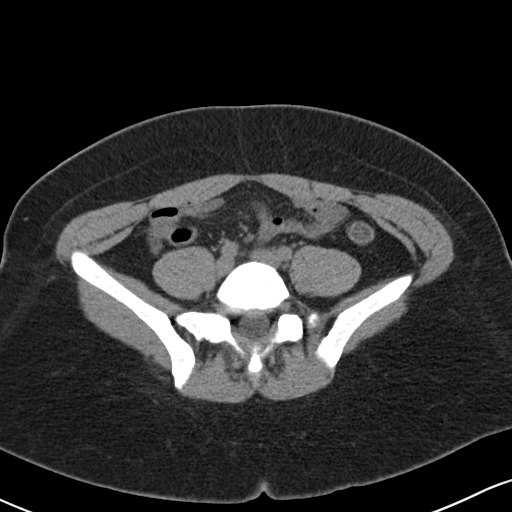
[im 47/89  soft-tissue]
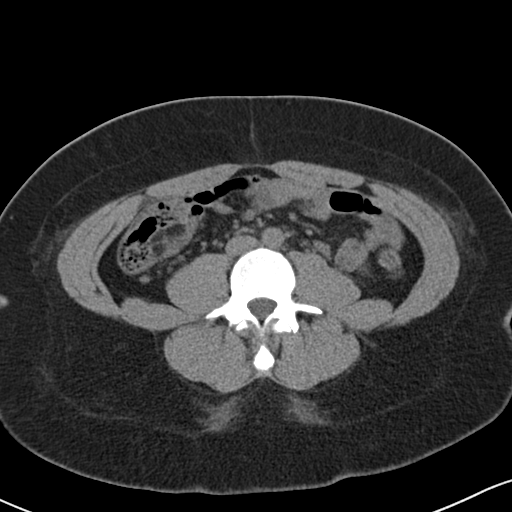
[im 51/89  soft-tissue]
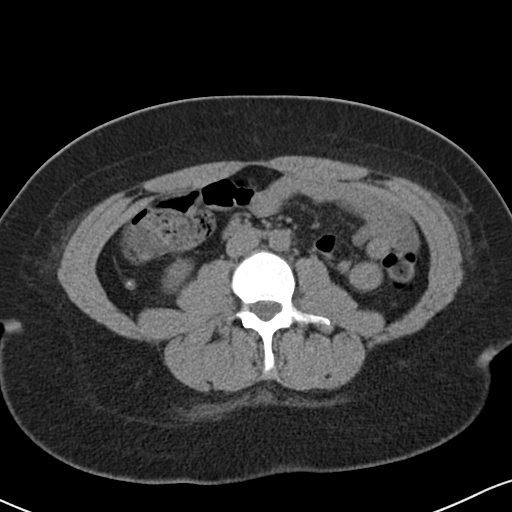
[im 56/89  soft-tissue]
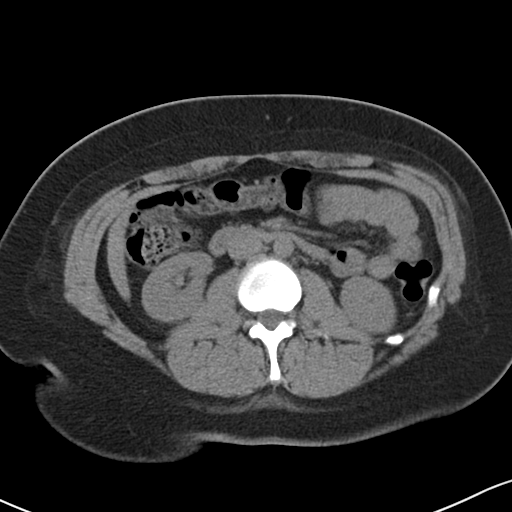
[im 56/89  bone]
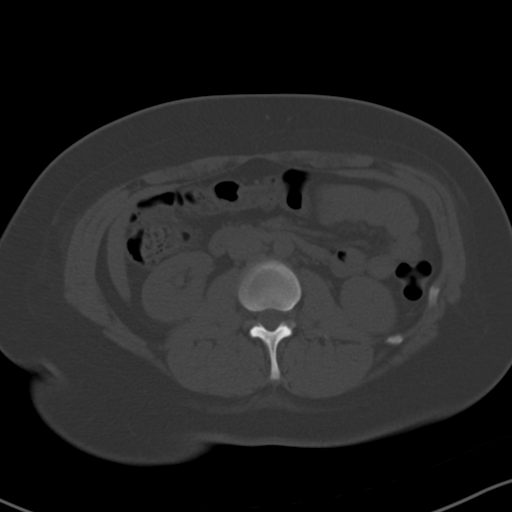
[im 65/89  soft-tissue]
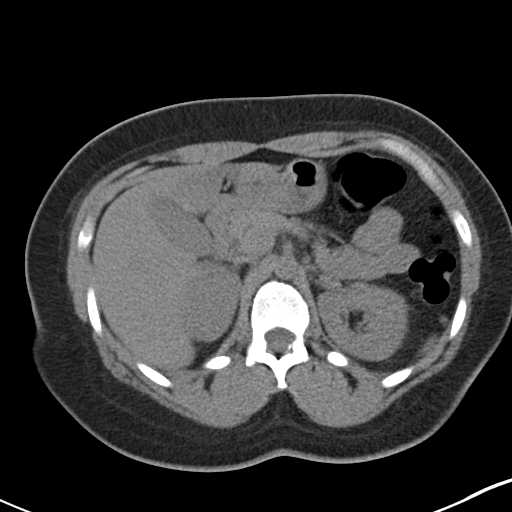
[im 70/89  soft-tissue]
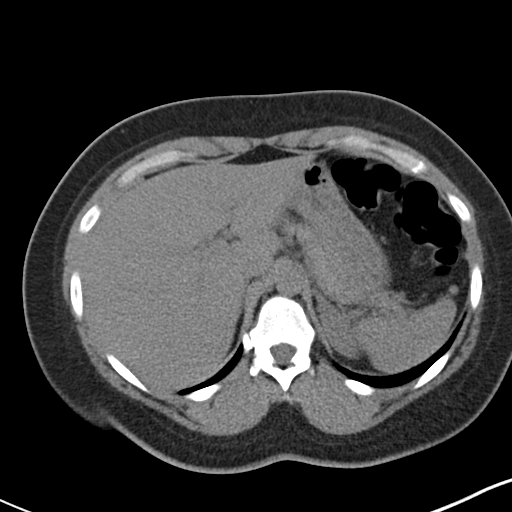
[im 75/89  soft-tissue]
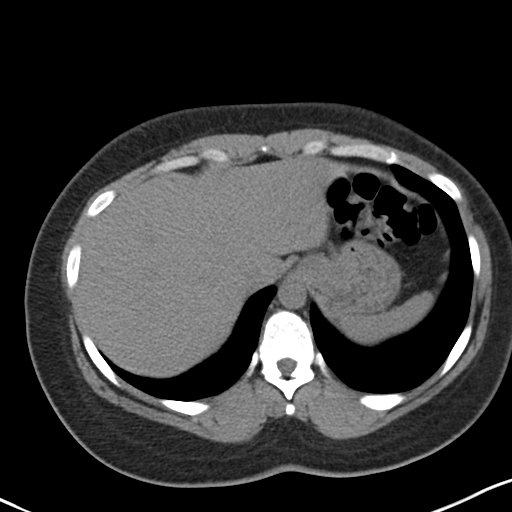
[im 84/89  soft-tissue]
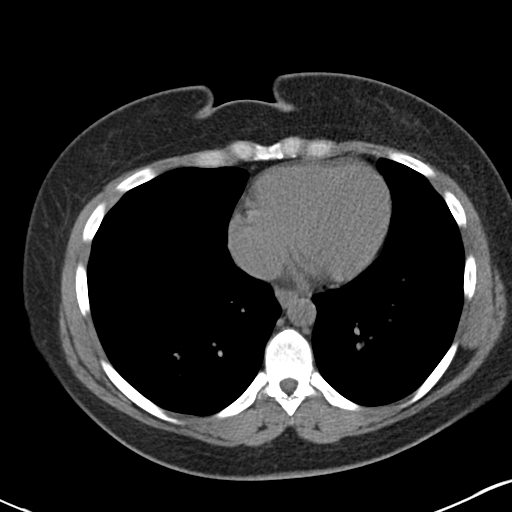

[Series 5: coronal st · coronal · 0.69mm/px · 3 of 77 slices shown]
[im 26/77  soft-tissue]
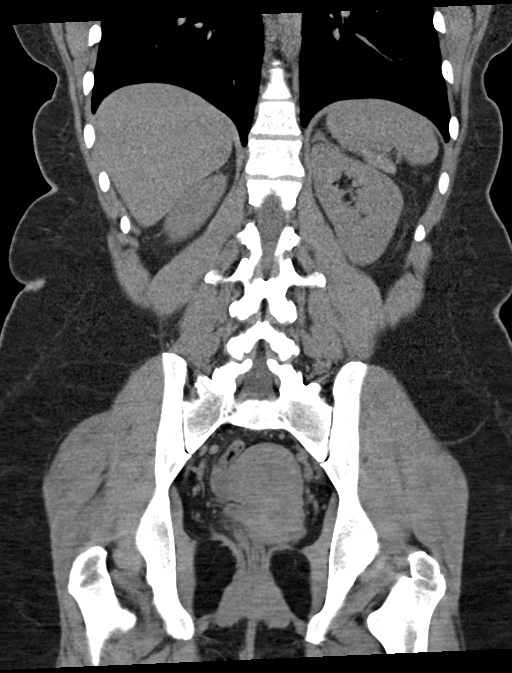
[im 34/77  soft-tissue]
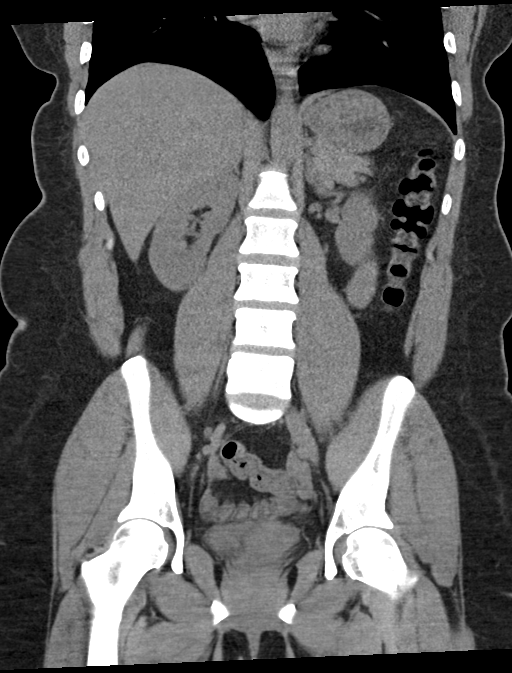
[im 43/77  soft-tissue]
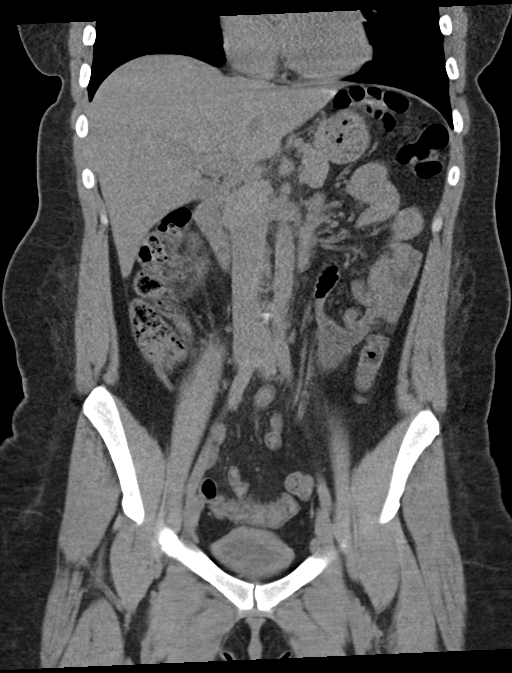

[16 of 46 positions shown; findings below may reference images not displayed]

FINDINGS: LOWER CHEST: Lung bases are clear. Included heart size is normal. No
pericardial effusion.

HEPATOBILIARY: Liver and gallbladder are normal.

PANCREAS: Normal.

SPLEEN: Normal.

ADRENALS/URINARY TRACT: Kidneys are orthotopic, demonstrating
symmetric enhancement. No nephrolithiasis, hydronephrosis or solid
renal masses. Urinary bladder is partially distended and
unremarkable. Normal adrenal glands.

STOMACH/BOWEL: The stomach, small and large bowel are normal in
course and caliber without inflammatory changes. Normal appendix.

VASCULAR/LYMPHATIC: Aortoiliac vessels are normal in course and
caliber. No lymphadenopathy by CT size criteria.

REPRODUCTIVE: Uterus and adnexa are unremarkable.

OTHER: No intraperitoneal free fluid or free air. Minimal
inflammatory change along the medial aspect of the left buttock
along the [place] cleft may represent site of pilonidal cyst.

MUSCULOSKELETAL: Nonacute.
IMPRESSION: 1. No acute intra-abdominal nor pelvic abnormality.
2. Mild inflammatory change along the medial aspect of the left
buttock along the [place] cleft without abscess.

## 2020-03-02 ENCOUNTER — Other Ambulatory Visit: Payer: Self-pay

## 2020-03-02 ENCOUNTER — Encounter (HOSPITAL_COMMUNITY): Payer: Self-pay

## 2020-03-02 ENCOUNTER — Emergency Department (HOSPITAL_COMMUNITY)
Admission: EM | Admit: 2020-03-02 | Discharge: 2020-03-02 | Disposition: A | Discharge status: Home / Self Care | Payer: Self-pay | Attending: Emergency Medicine | Admitting: Emergency Medicine

## 2020-03-02 ENCOUNTER — Emergency Department (HOSPITAL_COMMUNITY): Payer: Self-pay

## 2020-03-02 DIAGNOSIS — Z20822 Contact with and (suspected) exposure to covid-19: Secondary | ICD-10-CM | POA: Insufficient documentation

## 2020-03-02 DIAGNOSIS — Z79899 Other long term (current) drug therapy: Secondary | ICD-10-CM | POA: Insufficient documentation

## 2020-03-02 DIAGNOSIS — R002 Palpitations: Secondary | ICD-10-CM

## 2020-03-02 DIAGNOSIS — R0789 Other chest pain: Secondary | ICD-10-CM | POA: Insufficient documentation

## 2020-03-02 DIAGNOSIS — R0602 Shortness of breath: Secondary | ICD-10-CM | POA: Insufficient documentation

## 2020-03-02 LAB — BASIC METABOLIC PANEL
Anion gap: 10 (ref 5–15)
BUN: 10 mg/dL (ref 6–20)
CO2: 24 mmol/L (ref 22–32)
Calcium: 8.6 mg/dL — ABNORMAL LOW (ref 8.9–10.3)
Chloride: 105 mmol/L (ref 98–111)
Creatinine, Ser: 0.77 mg/dL (ref 0.44–1.00)
GFR calc Af Amer: >60 mL/min (ref >60)
GFR calc non Af Amer: >60 mL/min (ref >60)
Glucose, Bld: 89 mg/dL (ref 70–99)
Potassium: 4.1 mmol/L (ref 3.5–5.1)
Sodium: 139 mmol/L (ref 135–145)

## 2020-03-02 LAB — CBC
HCT: 42.3 % (ref 36.0–46.0)
Hemoglobin: 13.7 g/dL (ref 12.0–15.0)
MCH: 26.2 pg (ref 26.0–34.0)
MCHC: 32.4 g/dL (ref 30.0–36.0)
MCV: 81 fL (ref 80.0–100.0)
Platelets: 366 10*3/uL (ref 150–400)
RBC: 5.22 MIL/uL — ABNORMAL HIGH (ref 3.87–5.11)
RDW: 15.9 % — ABNORMAL HIGH (ref 11.5–15.5)
WBC: 13 10*3/uL — ABNORMAL HIGH (ref 4.0–10.5)
nRBC: 0 % (ref 0.0–0.2)

## 2020-03-02 LAB — TROPONIN I (HIGH SENSITIVITY): Troponin I (High Sensitivity): 4 ng/L (ref <18)

## 2020-03-02 LAB — BRAIN NATRIURETIC PEPTIDE: B Natriuretic Peptide: 47 pg/mL (ref 0.0–100.0)

## 2020-03-02 MED ORDER — SODIUM CHLORIDE 0.9% FLUSH: 3.0000 mL | Freq: Once | INTRAVENOUS | Status: DC

## 2020-03-02 NOTE — ED Triage Notes (Addendum)
Patient states she began having intermittent mid chest pressure and SOB since yesterday. Patient reports a non productive cough since this AM. Patient also reports increased swelling to her feet.

## 2020-03-03 LAB — SARS CORONAVIRUS 2 (TAT 6-24 HRS): SARS Coronavirus 2: NEGATIVE

## 2020-03-05 NOTE — ED Provider Notes (Signed)
[facility] DEPT Provider Note   CSN: [id number] Arrival date & time: 03/02/20  1715     History Chief Complaint  Patient presents with  . Palpitations  . Shortness of Breath    Frances Mccormick is a 34 y.o. female.  HPI   34 year old female with palpitations and a sensation that she cannot get a deep breath.  Intermittent since yesterday.  She has not noticed any appreciable exacerbating relieving factors.  No coughing.  No fevers or chills.  No unusual leg pain or swelling.  Past Medical History:  Diagnosis Date  . Migraine    "a few/year" (07/27/2018)  . Non-compliant patient     Patient Active Problem List   Diagnosis Date Noted  . Hypertension 09/30/2018  . Hypokalemia 09/30/2018  . Leukocytosis 09/30/2018  . Pilonidal abscess 09/30/2018  . Intractable nausea and vomiting 09/30/2018  . Chronic hypertension during pregnancy, antepartum 08/11/2018  . Nausea & vomiting 07/26/2018  . Medically noncompliant 10/26/2014    Past Surgical History:  Procedure Laterality Date  . INDUCED ABORTION       OB History    Gravida  2   Para  0   Term      Preterm      AB  1   Living        SAB      TAB      Ectopic      Multiple      Live Births              Family History  Problem Relation Age of Onset  . Diabetes Mother   . Diabetes Father     Social History   Tobacco Use  . Smoking status: Never Smoker  . Smokeless tobacco: Never Used  Substance Use Topics  . Alcohol use: Not Currently    Alcohol/week: 2.0 standard drinks    Types: 1 Glasses of wine, 1 Cans of beer per week  . Drug use: Yes    Types: Marijuana    Comment: occaisonally     Home Medications Prior to Admission medications   Medication Sig Start Date End Date Taking? Authorizing Provider  acetaminophen (TYLENOL) 500 MG tablet Take 500 mg by mouth 2 (two) times daily as needed (pain).   Yes [provider]  cephALEXin (KEFLEX) 500  MG capsule Take 1 capsule (500 mg total) by mouth 4 (four) times daily. Patient not taking: Reported on 03/02/2020 10/03/18   Debbe Odea, MD  famotidine (PEPCID) 20 MG tablet Take 1 tablet (20 mg total) by mouth 2 (two) times daily. Patient not taking: Reported on 03/02/2020 10/03/18   Debbe Odea, MD  HYDROcodone-acetaminophen (NORCO/VICODIN) 5-325 MG tablet Take 1 tablet by mouth every 6 (six) hours as needed for severe pain. Patient not taking: Reported on 03/02/2020 09/28/18   Little, Wenda Overland, MD  metoCLOPramide (REGLAN) 10 MG tablet Take 1 tablet (10 mg total) by mouth every 8 (eight) hours as needed for nausea. Patient not taking: Reported on 03/02/2020 10/03/18   Debbe Odea, MD  ondansetron (ZOFRAN ODT) 4 MG disintegrating tablet Take 1 tablet (4 mg total) by mouth 4 (four) times daily as needed for nausea or vomiting. Patient not taking: Reported on 03/02/2020 10/03/18   Debbe Odea, MD  potassium chloride SA (K-DUR,KLOR-CON) 20 MEQ tablet Take 2 tablets (40 mEq total) by mouth daily for 4 days. Patient not taking: Reported on 03/02/2020 10/03/18 10/07/18  Debbe Odea, MD  promethazine (  PHENERGAN) 25 MG suppository Place 1 suppository (25 mg total) rectally every 6 (six) hours as needed for nausea or vomiting. Patient not taking: Reported on 03/02/2020 10/03/18   Debbe Odea, MD  promethazine (PHENERGAN) 25 MG tablet Take 0.5-1 tablets (12.5-25 mg total) by mouth at bedtime as needed for nausea. Patient not taking: Reported on 03/02/2020 10/03/18   Debbe Odea, MD    Allergies    Bee venom and Ciprofloxacin  Review of Systems   Review of Systems All systems reviewed and negative, other than as noted in HPI. Physical Exam Updated Vital Signs BP (!) 127/93   Pulse 74   Temp 99 F (37.2 C) (Oral)   Resp 14   Ht 5' (1.524 m)   Wt 88.5 kg   LMP 01/31/2020 (Approximate)   SpO2 100%   BMI 38.08 kg/m   Physical Exam Vitals and nursing note reviewed.  Constitutional:       General: She is not in acute distress.    Appearance: She is well-developed.  HENT:     Head: Normocephalic and atraumatic.  Eyes:     General:        Right eye: No discharge.        Left eye: No discharge.     Conjunctiva/sclera: Conjunctivae normal.  Cardiovascular:     Rate and Rhythm: Normal rate and regular rhythm.     Heart sounds: Normal heart sounds. No murmur. No friction rub. No gallop.   Pulmonary:     Effort: Pulmonary effort is normal. No respiratory distress.     Breath sounds: Normal breath sounds.  Abdominal:     General: There is no distension.     Palpations: Abdomen is soft.     Tenderness: There is no abdominal tenderness.  Musculoskeletal:        General: No tenderness.     Cervical back: Neck supple.     Comments: Lower extremities symmetric as compared to each other. No calf tenderness. Negative Homan's. No palpable cords.   Skin:    General: Skin is warm and dry.  Neurological:     Mental Status: She is alert.  Psychiatric:        Behavior: Behavior normal.        Thought Content: Thought content normal.     ED Results / Procedures / Treatments   Labs (all labs ordered are listed, but only abnormal results are displayed) Labs Reviewed  BASIC METABOLIC PANEL - Abnormal; Notable for the following components:      Result Value   Calcium 8.6 (*)    All other components within normal limits  CBC - Abnormal; Notable for the following components:   WBC 13.0 (*)    RBC 5.22 (*)    RDW 15.9 (*)    All other components within normal limits  SARS CORONAVIRUS 2 (TAT 6-24 HRS)  BRAIN NATRIURETIC PEPTIDE  TROPONIN I (HIGH SENSITIVITY)  TROPONIN I (HIGH SENSITIVITY)    EKG EKG Interpretation  Date/Time:  Friday March 02 2020 17:23:45 EDT Ventricular Rate:  80 PR Interval:    QRS Duration: 87 QT Interval:  402 QTC Calculation: 464 R Axis:   26 Text Interpretation: Sinus rhythm Probable left atrial enlargement Borderline ST depression, lateral  leads Confirmed by Virgel Manifold [phone]) on 03/02/2020 5:29:53 PM   Radiology No results found.   DG Chest 2 View  Result Date: 03/02/2020 CLINICAL DATA:  Chest pain and shortness of breath. Nonproductive cough. Feet swelling. EXAM: CHEST -  2 VIEW COMPARISON:  01/21/2016 FINDINGS: The cardiomediastinal contours are normal. The lungs are clear. Pulmonary vasculature is normal. No consolidation, pleural effusion, or pneumothorax. No acute osseous abnormalities are seen. IMPRESSION: Negative radiographs of the chest. Electronically Signed   By: Keith Rake M.D.   On: 03/02/2020 17:46    Procedures Procedures (including critical care time)  Medications Ordered in ED Medications - No data to display  ED Course  I have reviewed the triage vital signs and the nursing notes.  Pertinent labs & imaging results that were available during my care of the patient were reviewed by me and considered in my medical decision making (see chart for details).    MDM Rules/Calculators/A&P                     34 year old female with palpitations and vague sensation of mild dyspnea or chest discomfort.  ED work-up pretty unremarkable.  She is afebrile.  Well-appearing.  Lungs sound clear.  Chest x-ray clear.  Troponin normal.  No signs/symptoms of DVT on exam.  I doubt PE, dissection, ACS or other emergent process.  Return precautions discussed.  Outpatient follow-up otherwise.    Final Clinical Impression(s) / ED Diagnoses Final diagnoses:  Chest discomfort  Palpitations    Rx / DC Orders ED Discharge Orders    None       Virgel Manifold, MD 03/05/20 [phone]

## 2020-03-23 ENCOUNTER — Encounter (HOSPITAL_COMMUNITY): Payer: Self-pay | Admitting: Emergency Medicine

## 2020-03-23 ENCOUNTER — Emergency Department (HOSPITAL_COMMUNITY)
Admission: EM | Admit: 2020-03-23 | Discharge: 2020-03-23 | Disposition: A | Discharge status: Home / Self Care | Payer: BC Managed Care – PPO | Attending: Emergency Medicine | Admitting: Emergency Medicine

## 2020-03-23 ENCOUNTER — Other Ambulatory Visit: Payer: Self-pay

## 2020-03-23 DIAGNOSIS — R112 Nausea with vomiting, unspecified: Secondary | ICD-10-CM

## 2020-03-23 DIAGNOSIS — I10 Essential (primary) hypertension: Secondary | ICD-10-CM | POA: Diagnosis not present

## 2020-03-23 DIAGNOSIS — Z79899 Other long term (current) drug therapy: Secondary | ICD-10-CM | POA: Diagnosis not present

## 2020-03-23 LAB — RAPID URINE DRUG SCREEN, HOSP PERFORMED
Amphetamines: NOT DETECTED
Barbiturates: NOT DETECTED
Benzodiazepines: NOT DETECTED
Cocaine: NOT DETECTED
Opiates: NOT DETECTED
Tetrahydrocannabinol: POSITIVE — AB

## 2020-03-23 LAB — CBC WITH DIFFERENTIAL/PLATELET
Abs Immature Granulocytes: 0.08 10*3/uL — ABNORMAL HIGH (ref 0.00–0.07)
Basophils Absolute: 0.1 10*3/uL (ref 0.0–0.1)
Basophils Relative: 0 %
Eosinophils Absolute: 0 10*3/uL (ref 0.0–0.5)
Eosinophils Relative: 0 %
HCT: 44.9 % (ref 36.0–46.0)
Hemoglobin: 14.4 g/dL (ref 12.0–15.0)
Immature Granulocytes: 0 %
Lymphocytes Relative: 3 %
Lymphs Abs: 0.6 10*3/uL — ABNORMAL LOW (ref 0.7–4.0)
MCH: 25.9 pg — ABNORMAL LOW (ref 26.0–34.0)
MCHC: 32.1 g/dL (ref 30.0–36.0)
MCV: 80.6 fL (ref 80.0–100.0)
Monocytes Absolute: 0.6 10*3/uL (ref 0.1–1.0)
Monocytes Relative: 3 %
Neutro Abs: 18.4 10*3/uL — ABNORMAL HIGH (ref 1.7–7.7)
Neutrophils Relative %: 94 %
Platelets: 349 10*3/uL (ref 150–400)
RBC: 5.57 MIL/uL — ABNORMAL HIGH (ref 3.87–5.11)
RDW: 15.4 % (ref 11.5–15.5)
WBC: 19.9 10*3/uL — ABNORMAL HIGH (ref 4.0–10.5)
nRBC: 0 % (ref 0.0–0.2)

## 2020-03-23 LAB — URINALYSIS, ROUTINE W REFLEX MICROSCOPIC
Bilirubin Urine: NEGATIVE
Glucose, UA: NEGATIVE mg/dL
Hgb urine dipstick: NEGATIVE
Ketones, ur: 80 mg/dL — AB
Leukocytes,Ua: NEGATIVE
Nitrite: NEGATIVE
Protein, ur: NEGATIVE mg/dL
Specific Gravity, Urine: 1.014 (ref 1.005–1.030)
pH: 7 (ref 5.0–8.0)

## 2020-03-23 LAB — COMPREHENSIVE METABOLIC PANEL
ALT: 12 U/L (ref 0–44)
AST: 22 U/L (ref 15–41)
Albumin: 4.5 g/dL (ref 3.5–5.0)
Alkaline Phosphatase: 69 U/L (ref 38–126)
Anion gap: 13 (ref 5–15)
BUN: 7 mg/dL (ref 6–20)
CO2: 22 mmol/L (ref 22–32)
Calcium: 9.1 mg/dL (ref 8.9–10.3)
Chloride: 104 mmol/L (ref 98–111)
Creatinine, Ser: 0.68 mg/dL (ref 0.44–1.00)
GFR calc Af Amer: >60 mL/min (ref >60)
GFR calc non Af Amer: >60 mL/min (ref >60)
Glucose, Bld: 115 mg/dL — ABNORMAL HIGH (ref 70–99)
Potassium: 3.9 mmol/L (ref 3.5–5.1)
Sodium: 139 mmol/L (ref 135–145)
Total Bilirubin: 0.7 mg/dL (ref 0.3–1.2)
Total Protein: 8.7 g/dL — ABNORMAL HIGH (ref 6.5–8.1)

## 2020-03-23 LAB — LIPASE, BLOOD: Lipase: 24 U/L (ref 11–51)

## 2020-03-23 MED ORDER — HALOPERIDOL LACTATE 5 MG/ML IJ SOLN
5.0000 mg | Freq: Once | INTRAMUSCULAR | Status: AC
  Administered 2020-03-23: 08:00:00 5 mg via INTRAVENOUS
  Filled 2020-03-23: qty 1

## 2020-03-23 MED ORDER — ONDANSETRON HCL 4 MG/2ML IJ SOLN
4.0000 mg | Freq: Once | INTRAMUSCULAR | Status: AC
  Administered 2020-03-23: 4 mg via INTRAVENOUS
  Filled 2020-03-23: qty 2

## 2020-03-23 MED ORDER — ONDANSETRON 4 MG PO TBDP: 4.0000 mg | ORAL_TABLET | Freq: Three times a day (TID) | ORAL | 0 refills | Status: DC | PRN

## 2020-03-23 MED ORDER — SODIUM CHLORIDE 0.9 % IV BOLUS
1000.0000 mL | Freq: Once | INTRAVENOUS | Status: AC
  Administered 2020-03-23: 08:00:00 1000 mL via INTRAVENOUS

## 2020-03-23 NOTE — Discharge Instructions (Signed)
Take Zofran as needed for nausea.  Please avoid marijuana use as it may contribute to your symptoms.  Follow-up with your doctor for further care.  Return if you have any concern.

## 2020-03-23 NOTE — ED Notes (Signed)
Light green tube recollected at this time and sent to lab.

## 2020-03-23 NOTE — ED Notes (Signed)
Pt requesting medication for nausea. PA aware. Pt reports she is unable to provide urine sample at this time

## 2020-03-23 NOTE — ED Provider Notes (Signed)
[facility] DEPT Provider Note   CSN: [id number] Arrival date & time: 03/23/20  9798     History Chief Complaint  Patient presents with  . Vomiting    Frances Mccormick is a 34 y.o. female.  The history is provided by the patient and medical records. No language interpreter was used.     34 year old female with history of hypertension, marijuana abuse, migraine headaches presents ED via EMS from home for evaluation of nausea and vomiting.  Patient states since last night she has been having persistent nausea, has vomited multiple episodes of nonbloody nonbilious vomits, and having abdominal discomfort.  She describes her pain as a sharp cramping sensation that started in her lower back and wraps around her abdomen.  Pain is moderate in severity, nausea is intense and she is unable to keep bending down.  She has normal bowel movement yesterday.  She denies any associated fever chills no chest pain shortness of breath productive cough no dysuria or hematuria.  No blood per rectum.  She admits last marijuana use was 2 days ago.  No recent alcohol abuse.  She report 1 similar flareup like this a year ago.  Past Medical History:  Diagnosis Date  . Migraine    "a few/year" (07/27/2018)  . Non-compliant patient     Patient Active Problem List   Diagnosis Date Noted  . Hypertension 09/30/2018  . Hypokalemia 09/30/2018  . Leukocytosis 09/30/2018  . Pilonidal abscess 09/30/2018  . Intractable nausea and vomiting 09/30/2018  . Chronic hypertension during pregnancy, antepartum 08/11/2018  . Nausea & vomiting 07/26/2018  . Medically noncompliant 10/26/2014    Past Surgical History:  Procedure Laterality Date  . INDUCED ABORTION       OB History    Gravida  2   Para  0   Term      Preterm      AB  1   Living        SAB      TAB      Ectopic      Multiple      Live Births              Family History  Problem Relation Age of Onset   . Diabetes Mother   . Diabetes Father     Social History   Tobacco Use  . Smoking status: Never Smoker  . Smokeless tobacco: Never Used  Substance Use Topics  . Alcohol use: Not Currently    Alcohol/week: 2.0 standard drinks    Types: 1 Glasses of wine, 1 Cans of beer per week  . Drug use: Yes    Types: Marijuana    Comment: occaisonally     Home Medications Prior to Admission medications   Medication Sig Start Date End Date Taking? Authorizing Provider  acetaminophen (TYLENOL) 500 MG tablet Take 500 mg by mouth 2 (two) times daily as needed (pain).    [provider]  cephALEXin (KEFLEX) 500 MG capsule Take 1 capsule (500 mg total) by mouth 4 (four) times daily. Patient not taking: Reported on 03/02/2020 10/03/18   Debbe Odea, MD  famotidine (PEPCID) 20 MG tablet Take 1 tablet (20 mg total) by mouth 2 (two) times daily. Patient not taking: Reported on 03/02/2020 10/03/18   Debbe Odea, MD  HYDROcodone-acetaminophen (NORCO/VICODIN) 5-325 MG tablet Take 1 tablet by mouth every 6 (six) hours as needed for severe pain. Patient not taking: Reported on 03/02/2020 09/28/18   Little,  Wenda Overland, MD  metoCLOPramide (REGLAN) 10 MG tablet Take 1 tablet (10 mg total) by mouth every 8 (eight) hours as needed for nausea. Patient not taking: Reported on 03/02/2020 10/03/18   Debbe Odea, MD  ondansetron (ZOFRAN ODT) 4 MG disintegrating tablet Take 1 tablet (4 mg total) by mouth 4 (four) times daily as needed for nausea or vomiting. Patient not taking: Reported on 03/02/2020 10/03/18   Debbe Odea, MD  potassium chloride SA (K-DUR,KLOR-CON) 20 MEQ tablet Take 2 tablets (40 mEq total) by mouth daily for 4 days. Patient not taking: Reported on 03/02/2020 10/03/18 10/07/18  Debbe Odea, MD  promethazine (PHENERGAN) 25 MG suppository Place 1 suppository (25 mg total) rectally every 6 (six) hours as needed for nausea or vomiting. Patient not taking: Reported on 03/02/2020 10/03/18   Debbe Odea,  MD  promethazine (PHENERGAN) 25 MG tablet Take 0.5-1 tablets (12.5-25 mg total) by mouth at bedtime as needed for nausea. Patient not taking: Reported on 03/02/2020 10/03/18   Debbe Odea, MD    Allergies    Bee venom and Ciprofloxacin  Review of Systems   Review of Systems  All other systems reviewed and are negative.   Physical Exam Updated Vital Signs BP (!) 185/123 (BP Location: Left Arm)   Pulse 66   Temp 98.4 F (36.9 C) (Oral)   Resp 19   Ht 5' (1.524 m)   Wt 90.7 kg   SpO2 100%   BMI 39.06 kg/m   Physical Exam Vitals and nursing note reviewed.  Constitutional:      Appearance: She is well-developed.     Comments: Patient appears uncomfortable, actively vomiting.  HENT:     Head: Atraumatic.  Eyes:     Conjunctiva/sclera: Conjunctivae normal.  Cardiovascular:     Rate and Rhythm: Normal rate and regular rhythm.     Pulses: Normal pulses.     Heart sounds: Normal heart sounds.  Pulmonary:     Effort: Pulmonary effort is normal.     Breath sounds: Normal breath sounds.  Abdominal:     Palpations: Abdomen is soft.     Tenderness: There is abdominal tenderness (Diffuse tenderness without focal point tenderness.).  Musculoskeletal:     Cervical back: Neck supple.  Skin:    Findings: No rash.  Neurological:     Mental Status: She is alert. Mental status is at baseline.  Psychiatric:        Mood and Affect: Mood normal.     ED Results / Procedures / Treatments   Labs (all labs ordered are listed, but only abnormal results are displayed) Labs Reviewed  CBC WITH DIFFERENTIAL/PLATELET - Abnormal; Notable for the following components:      Result Value   WBC 19.9 (*)    RBC 5.57 (*)    MCH 25.9 (*)    Neutro Abs 18.4 (*)    Lymphs Abs 0.6 (*)    Abs Immature Granulocytes 0.08 (*)    All other components within normal limits  URINALYSIS, ROUTINE W REFLEX MICROSCOPIC - Abnormal; Notable for the following components:   APPearance HAZY (*)    Ketones, ur  80 (*)    All other components within normal limits  RAPID URINE DRUG SCREEN, HOSP PERFORMED - Abnormal; Notable for the following components:   Tetrahydrocannabinol POSITIVE (*)    All other components within normal limits  COMPREHENSIVE METABOLIC PANEL - Abnormal; Notable for the following components:   Glucose, Bld 115 (*)    Total Protein  8.7 (*)    All other components within normal limits  LIPASE, BLOOD    EKG None  Radiology No results found.  Procedures Procedures (including critical care time)  Medications Ordered in ED Medications  ondansetron (ZOFRAN) injection 4 mg (4 mg Intravenous Given 03/23/20 0804)  haloperidol lactate (HALDOL) injection 5 mg (5 mg Intravenous Given 03/23/20 0804)  sodium chloride 0.9 % bolus 1,000 mL (0 mLs Intravenous Stopped 03/23/20 1040)    ED Course  I have reviewed the triage vital signs and the nursing notes.  Pertinent labs & imaging results that were available during my care of the patient were reviewed by me and considered in my medical decision making (see chart for details).    MDM Rules/Calculators/A&P                      BP 140/82   Pulse 91   Temp 98.4 F (36.9 C) (Oral)   Resp 18   Ht 5' (1.524 m)   Wt 90.7 kg   SpO2 100%   BMI 39.06 kg/m   Final Clinical Impression(s) / ED Diagnoses Final diagnoses:  Intractable nausea and vomiting    Rx / DC Orders ED Discharge Orders         Ordered    ondansetron (ZOFRAN ODT) 4 MG disintegrating tablet  Every 8 hours PRN     03/23/20 1103         7:41 AM Patient with history of marijuana use, here with abdominal pain with associate nausea and vomiting.  Symptoms may indicate cannabinoid hyperemesis syndrome.  Initial triage note mention that patient believes she has a Crohn's flare, upon review of prior documentation, patient has had previous work-up without any finding to suggest Crohn's colitis.  Will provide symptomatic treatment, work-up initiated.  10:59  AM Labs remarkable for leukocytosis with WBC 19.9.  She has had leukocytosis in the past.  UA shows 80 ketones suggesting dehydration.  Normal lipase.  UDS positive for tetrahydrocannabinol.  After receiving treatment and monitoring as well as IV fluid, at this time patient felt better.  She is able to tolerate p.o., reexamination of abdomen reveals minimal abdominal discomfort.  Strong encourage patient to avoid marijuana use as it may contribute to her symptoms.  Patient can follow-up outpatient however return precaution discussed.  At this time I have low suspicion for gallbladder etiology, appendicitis, or other acute emergent medical condition.  At the same time, pt should return if symptoms worsen.   Domenic Moras, PA-C 03/23/20 1105    Veryl Speak, MD 03/23/20 [phone]

## 2020-03-23 NOTE — ED Triage Notes (Addendum)
Patient brought in by [facility] from home. Patient states she had been vomiting for 24 hours. Patient states that she thinks her crohns is flaring up. Patient does not have a hx of crohns

## 2020-03-24 ENCOUNTER — Emergency Department (HOSPITAL_BASED_OUTPATIENT_CLINIC_OR_DEPARTMENT_OTHER): Payer: BC Managed Care – PPO

## 2020-03-24 ENCOUNTER — Encounter (HOSPITAL_BASED_OUTPATIENT_CLINIC_OR_DEPARTMENT_OTHER): Payer: Self-pay

## 2020-03-24 ENCOUNTER — Emergency Department (HOSPITAL_BASED_OUTPATIENT_CLINIC_OR_DEPARTMENT_OTHER)
Admission: EM | Admit: 2020-03-24 | Discharge: 2020-03-24 | Disposition: A | Discharge status: Home / Self Care | Payer: BC Managed Care – PPO | Attending: Emergency Medicine | Admitting: Emergency Medicine

## 2020-03-24 ENCOUNTER — Other Ambulatory Visit: Payer: Self-pay

## 2020-03-24 DIAGNOSIS — Z79899 Other long term (current) drug therapy: Secondary | ICD-10-CM | POA: Diagnosis not present

## 2020-03-24 DIAGNOSIS — I10 Essential (primary) hypertension: Secondary | ICD-10-CM | POA: Diagnosis not present

## 2020-03-24 DIAGNOSIS — R112 Nausea with vomiting, unspecified: Secondary | ICD-10-CM | POA: Diagnosis not present

## 2020-03-24 DIAGNOSIS — R1030 Lower abdominal pain, unspecified: Secondary | ICD-10-CM

## 2020-03-24 DIAGNOSIS — R1031 Right lower quadrant pain: Secondary | ICD-10-CM | POA: Diagnosis present

## 2020-03-24 DIAGNOSIS — K5090 Crohn's disease, unspecified, without complications: Secondary | ICD-10-CM | POA: Insufficient documentation

## 2020-03-24 HISTORY — DX: Crohn's disease of large intestine without complications: K50.10

## 2020-03-24 LAB — URINALYSIS, ROUTINE W REFLEX MICROSCOPIC
Bilirubin Urine: NEGATIVE
Glucose, UA: NEGATIVE mg/dL
Ketones, ur: >80 mg/dL — AB
Leukocytes,Ua: NEGATIVE
Nitrite: NEGATIVE
Protein, ur: 100 mg/dL — AB
Specific Gravity, Urine: >1.03 — ABNORMAL HIGH (ref 1.005–1.030)
pH: 6 (ref 5.0–8.0)

## 2020-03-24 LAB — CBC
HCT: 46.3 % — ABNORMAL HIGH (ref 36.0–46.0)
Hemoglobin: 15.6 g/dL — ABNORMAL HIGH (ref 12.0–15.0)
MCH: 25.9 pg — ABNORMAL LOW (ref 26.0–34.0)
MCHC: 33.7 g/dL (ref 30.0–36.0)
MCV: 76.8 fL — ABNORMAL LOW (ref 80.0–100.0)
Platelets: 361 10*3/uL (ref 150–400)
RBC: 6.03 MIL/uL — ABNORMAL HIGH (ref 3.87–5.11)
RDW: 15.7 % — ABNORMAL HIGH (ref 11.5–15.5)
WBC: 11.9 10*3/uL — ABNORMAL HIGH (ref 4.0–10.5)
nRBC: 0 % (ref 0.0–0.2)

## 2020-03-24 LAB — COMPREHENSIVE METABOLIC PANEL
ALT: 21 U/L (ref 0–44)
AST: 50 U/L — ABNORMAL HIGH (ref 15–41)
Albumin: 4.4 g/dL (ref 3.5–5.0)
Alkaline Phosphatase: 68 U/L (ref 38–126)
Anion gap: 14 (ref 5–15)
BUN: 14 mg/dL (ref 6–20)
CO2: 21 mmol/L — ABNORMAL LOW (ref 22–32)
Calcium: 9.1 mg/dL (ref 8.9–10.3)
Chloride: 100 mmol/L (ref 98–111)
Creatinine, Ser: 0.74 mg/dL (ref 0.44–1.00)
GFR calc Af Amer: >60 mL/min (ref >60)
GFR calc non Af Amer: >60 mL/min (ref >60)
Glucose, Bld: 99 mg/dL (ref 70–99)
Potassium: 3.1 mmol/L — ABNORMAL LOW (ref 3.5–5.1)
Sodium: 135 mmol/L (ref 135–145)
Total Bilirubin: 0.8 mg/dL (ref 0.3–1.2)
Total Protein: 8.9 g/dL — ABNORMAL HIGH (ref 6.5–8.1)

## 2020-03-24 LAB — URINALYSIS, MICROSCOPIC (REFLEX)

## 2020-03-24 LAB — PREGNANCY, URINE: Preg Test, Ur: NEGATIVE

## 2020-03-24 LAB — LIPASE, BLOOD: Lipase: 21 U/L (ref 11–51)

## 2020-03-24 MED ORDER — ONDANSETRON HCL 4 MG/2ML IJ SOLN
4.0000 mg | Freq: Once | INTRAMUSCULAR | Status: AC | PRN
  Administered 2020-03-24: 18:00:00 4 mg via INTRAVENOUS
  Filled 2020-03-24: qty 2

## 2020-03-24 MED ORDER — MORPHINE SULFATE (PF) 4 MG/ML IV SOLN
4.0000 mg | Freq: Once | INTRAVENOUS | Status: AC
  Administered 2020-03-24: 4 mg via INTRAVENOUS
  Filled 2020-03-24: qty 1

## 2020-03-24 MED ORDER — ONDANSETRON 4 MG PO TBDP
4.0000 mg | ORAL_TABLET | Freq: Three times a day (TID) | ORAL | 0 refills | Status: DC | PRN
Start: 2020-03-24 — End: 2021-06-07

## 2020-03-24 MED ORDER — ONDANSETRON 4 MG PO TBDP
4.0000 mg | ORAL_TABLET | Freq: Once | ORAL | Status: AC
  Administered 2020-03-24: 4 mg via ORAL
  Filled 2020-03-24: qty 1

## 2020-03-24 MED ORDER — IOHEXOL 300 MG/ML  SOLN
100.0000 mL | Freq: Once | INTRAMUSCULAR | Status: AC | PRN
  Administered 2020-03-24: 100 mL via INTRAVENOUS

## 2020-03-24 MED ORDER — DICYCLOMINE HCL 20 MG PO TABS
20.0000 mg | ORAL_TABLET | Freq: Two times a day (BID) | ORAL | 0 refills | Status: DC
Start: 2020-03-24 — End: 2021-06-07

## 2020-03-24 MED ORDER — DICYCLOMINE HCL 10 MG PO CAPS
10.0000 mg | ORAL_CAPSULE | Freq: Once | ORAL | Status: AC
  Administered 2020-03-24: 10 mg via ORAL
  Filled 2020-03-24: qty 1

## 2020-03-24 NOTE — ED Provider Notes (Signed)
[facility] EMERGENCY DEPARTMENT Provider Note   CSN: [id number] Arrival date & time: 03/24/20  1616     History Chief Complaint  Patient presents with  . Abdominal Pain    Frances Mccormick is a 34 y.o. female past medical of Crohn's, migraine who presents for evaluation of abdominal pain, nausea/vomiting that has been ongoing for last 2 days.  Patient reports she has a history of Crohn's.  She has not had a Crohn's flare in about a year but states symptoms are consistent with previous episodes of Crohn's flare.  She reports that she has not seen GI in over 2 years.  She is not currently on any medications for Crohn's management.  Reports that prior to onset of symptoms, she had been very stressed and wonders if that triggered a flare.  She reports lower abdominal pain, particularly in the right side.  She has had nausea/vomiting over the last 2 days and has not been able to tolerate any p.o.  She states she has had a few episodes where she has had small streaks of blood in her emesis but otherwise no gross hematemesis.  She denies any diarrhea.  She has not noted any fever at home.  Patient denies any chest pain, difficulty breathing, dysuria, hematuria.  The history is provided by the patient.       Past Medical History:  Diagnosis Date  . Crohn's colitis ([place])   . Migraine    "a few/year" (07/27/2018)  . Non-compliant patient     Patient Active Problem List   Diagnosis Date Noted  . Hypertension 09/30/2018  . Hypokalemia 09/30/2018  . Leukocytosis 09/30/2018  . Pilonidal abscess 09/30/2018  . Intractable nausea and vomiting 09/30/2018  . Chronic hypertension during pregnancy, antepartum 08/11/2018  . Nausea & vomiting 07/26/2018  . Medically noncompliant 10/26/2014    Past Surgical History:  Procedure Laterality Date  . INDUCED ABORTION       OB History    Gravida  2   Para  0   Term      Preterm      AB  1   Living        SAB      TAB      Ectopic      Multiple      Live Births              Family History  Problem Relation Age of Onset  . Diabetes Mother   . Diabetes Father     Social History   Tobacco Use  . Smoking status: Never Smoker  . Smokeless tobacco: Never Used  Substance Use Topics  . Alcohol use: Yes    Alcohol/week: 2.0 standard drinks    Types: 1 Glasses of wine, 1 Cans of beer per week  . Drug use: Yes    Types: Marijuana    Comment: occaisonally     Home Medications Prior to Admission medications   Medication Sig Start Date End Date Taking? Authorizing Provider  acetaminophen (TYLENOL) 500 MG tablet Take 500 mg by mouth 2 (two) times daily as needed (pain).    [provider]  dicyclomine (BENTYL) 20 MG tablet Take 1 tablet (20 mg total) by mouth 2 (two) times daily. 03/24/20   Volanda Napoleon, PA-C  ondansetron (ZOFRAN ODT) 4 MG disintegrating tablet Take 1 tablet (4 mg total) by mouth every 8 (eight) hours as needed for nausea or vomiting. 03/24/20   Volanda Napoleon,  PA-C  famotidine (PEPCID) 20 MG tablet Take 1 tablet (20 mg total) by mouth 2 (two) times daily. Patient not taking: Reported on 03/02/2020 10/03/18 03/23/20  Debbe Odea, MD  metoCLOPramide (REGLAN) 10 MG tablet Take 1 tablet (10 mg total) by mouth every 8 (eight) hours as needed for nausea. Patient not taking: Reported on 03/02/2020 10/03/18 03/23/20  Debbe Odea, MD  potassium chloride SA (K-DUR,KLOR-CON) 20 MEQ tablet Take 2 tablets (40 mEq total) by mouth daily for 4 days. Patient not taking: Reported on 03/02/2020 10/03/18 03/23/20  Debbe Odea, MD  promethazine (PHENERGAN) 25 MG tablet Take 0.5-1 tablets (12.5-25 mg total) by mouth at bedtime as needed for nausea. Patient not taking: Reported on 03/02/2020 10/03/18 03/23/20  Debbe Odea, MD    Allergies    Bee venom and Ciprofloxacin  Review of Systems   Review of Systems  Constitutional: Negative for fever.  Respiratory: Negative for cough and shortness of  breath.   Cardiovascular: Negative for chest pain.  Gastrointestinal: Positive for abdominal pain, nausea and vomiting. Negative for diarrhea.  Genitourinary: Negative for dysuria and hematuria.  Neurological: Negative for headaches.  All other systems reviewed and are negative.   Physical Exam Updated Vital Signs BP 136/90 (BP Location: Right Arm)   Pulse 82   Temp 99.6 F (37.6 C) (Oral)   Resp 16   Ht 5' (1.524 m)   Wt 90.7 kg   SpO2 100%   BMI 39.06 kg/m   Physical Exam Vitals and nursing note reviewed.  Constitutional:      Appearance: Normal appearance. She is well-developed.  HENT:     Head: Normocephalic and atraumatic.  Eyes:     General: Lids are normal.     Conjunctiva/sclera: Conjunctivae normal.     Pupils: Pupils are equal, round, and reactive to light.  Cardiovascular:     Rate and Rhythm: Normal rate and regular rhythm.     Pulses: Normal pulses.     Heart sounds: Normal heart sounds. No murmur. No friction rub. No gallop.   Pulmonary:     Effort: Pulmonary effort is normal.     Breath sounds: Normal breath sounds.     Comments: Lungs clear to auscultation bilaterally.  Symmetric chest rise.  No wheezing, rales, rhonchi. Abdominal:     Palpations: Abdomen is soft. Abdomen is not rigid.     Tenderness: There is abdominal tenderness in the right lower quadrant and suprapubic area. There is no right CVA tenderness, left CVA tenderness or guarding.     Comments: Abdomen soft, nondistended.  Tenderness palpation of the right lower quadrant and suprapubic region.  No CVA tenderness noted bilaterally.  Musculoskeletal:        General: Normal range of motion.     Cervical back: Full passive range of motion without pain.  Skin:    General: Skin is warm and dry.     Capillary Refill: Capillary refill takes less than 2 seconds.  Neurological:     Mental Status: She is alert and oriented to person, place, and time.  Psychiatric:        Speech: Speech normal.      ED Results / Procedures / Treatments   Labs (all labs ordered are listed, but only abnormal results are displayed) Labs Reviewed  COMPREHENSIVE METABOLIC PANEL - Abnormal; Notable for the following components:      Result Value   Potassium 3.1 (*)    CO2 21 (*)    Total Protein 8.9 (*)  AST 50 (*)    All other components within normal limits  CBC - Abnormal; Notable for the following components:   WBC 11.9 (*)    RBC 6.03 (*)    Hemoglobin 15.6 (*)    HCT 46.3 (*)    MCV 76.8 (*)    MCH 25.9 (*)    RDW 15.7 (*)    All other components within normal limits  URINALYSIS, ROUTINE W REFLEX MICROSCOPIC - Abnormal; Notable for the following components:   APPearance CLOUDY (*)    Specific Gravity, Urine >1.030 (*)    Hgb urine dipstick MODERATE (*)    Ketones, ur >80 (*)    Protein, ur 100 (*)    All other components within normal limits  URINALYSIS, MICROSCOPIC (REFLEX) - Abnormal; Notable for the following components:   Bacteria, UA FEW (*)    All other components within normal limits  LIPASE, BLOOD  PREGNANCY, URINE    EKG None  Radiology CT ABDOMEN PELVIS W CONTRAST  Result Date: 03/24/2020 CLINICAL DATA:  Crohn disease, abdominal pain, nausea and vomiting for 2 days EXAM: CT ABDOMEN AND PELVIS WITH CONTRAST TECHNIQUE: Multidetector CT imaging of the abdomen and pelvis was performed using the standard protocol following bolus administration of intravenous contrast. CONTRAST:  116m OMNIPAQUE IOHEXOL 300 MG/ML  SOLN COMPARISON:  09/29/2018 FINDINGS: Lower chest: No acute pleural or parenchymal lung disease. Hepatobiliary: Stable focal fatty infiltration surrounding the falciform ligament. No other focal liver abnormalities. Gallbladder is grossly normal. Pancreas: Unremarkable. No pancreatic ductal dilatation or surrounding inflammatory changes. Spleen: Normal in size without focal abnormality. Adrenals/Urinary Tract: Adrenal glands are unremarkable. Kidneys are normal,  without renal calculi, focal lesion, or hydronephrosis. Bladder is unremarkable. Stomach/Bowel: Normal retrocecal appendix. No bowel obstruction or ileus. No bowel wall thickening or inflammatory change. Vascular/Lymphatic: No significant vascular findings are present. No enlarged abdominal or pelvic lymph nodes. Reproductive: Stable small uterine fibroid posteriorly measuring 1.9 cm. No adnexal masses. Other: No abdominal wall hernia or abnormality. No abdominopelvic ascites. Musculoskeletal: No acute or destructive bony lesions. Reconstructed images demonstrate no additional findings. IMPRESSION: 1. No evidence for acute process. 2. Stable focal fatty infiltration surrounding the falciform ligament. 3. Stable small uterine fibroid. Electronically Signed   By: MRanda NgoM.D.   On: 03/24/2020 19:27    Procedures Procedures (including critical care time)  Medications Ordered in ED Medications  ondansetron (ZOFRAN) injection 4 mg (4 mg Intravenous Given 03/24/20 1808)  morphine 4 MG/ML injection 4 mg (4 mg Intravenous Given 03/24/20 1903)  iohexol (OMNIPAQUE) 300 MG/ML solution 100 mL (100 mLs Intravenous Contrast Given 03/24/20 1907)  dicyclomine (BENTYL) capsule 10 mg (10 mg Oral Given 03/24/20 2024)  ondansetron (ZOFRAN-ODT) disintegrating tablet 4 mg (4 mg Oral Given 03/24/20 2025)    ED Course  I have reviewed the triage vital signs and the nursing notes.  Pertinent labs & imaging results that were available during my care of the patient were reviewed by me and considered in my medical decision making (see chart for details).    MDM Rules/Calculators/A&P                      34year old female past history of Crohn's colitis presents for evaluation of abdominal pain, nausea/vomiting that has been ongoing for the last 2 days.  No fevers.  Reports that she is not currently on any medications for her Crohn's.  Has not followed up with GI in several years.  No diarrhea.  On initial ED arrival,  she is afebrile, nontoxic-appearing but does appear uncomfortable.  Vitals stable.  On exam, she has tenderness noted to the right lower quadrant as well as suprapubic region.  Consider Crohn's colitis versus viral GI process.  Also given right lower quadrant tenderness, consider appendicitis though sounds atypical.  Not suspect GU etiology.  Labs ordered at triage.  CBC shows slight leukocytosis of 11.9.  Hemoglobin stable at 15.6.  CMP shows potassium 3.1.  BUN and creatinine within normal limits.  Lipase unremarkable.  UA shows moderate hemoglobin.  Otherwise no infectious etiology.  CT on pelvis shows normal appendix.  No evidence of bowel obstruction or ileus.  No inflammatory change.  She has a stable small uterine fibroid measuring 1.9 cm.  There is mention of a stable focal fatty infiltration surrounding the full form ligament.  Otherwise unremarkable.  Reevaluation.  Patient reports pain is improved.  Repeat abdominal exam does show improvement in tenderness.  On exam, she is able to tolerate p.o. without any difficulty.  She is hemodynamically stable.  Will give Zofran and Bentyl for symptomatic relief.  Patient instructed to follow-up with her GI doctor. At this time, patient exhibits no emergent life-threatening condition that require further evaluation in ED or admission. Patient had ample opportunity for questions and discussion. All patient's questions were answered with full understanding. Strict return precautions discussed. Patient expresses understanding and agreement to plan.   Portions of this note were generated with [employer]. Dictation errors may occur despite best attempts at proofreading.   Final Clinical Impression(s) / ED Diagnoses Final diagnoses:  Non-intractable vomiting with nausea, unspecified vomiting type  Lower abdominal pain    Rx / DC Orders ED Discharge Orders         Ordered    ondansetron (ZOFRAN ODT) 4 MG disintegrating tablet  Every 8  hours PRN     03/24/20 2017    dicyclomine (BENTYL) 20 MG tablet  2 times daily     03/24/20 2017           Desma Mcgregor 03/24/20 2220    Malvin Johns, MD 03/24/20 2316

## 2020-03-24 NOTE — ED Triage Notes (Signed)
Pt states she thinks she is having a Chron's flare up. Pt c/o abd pain, N/V x 2 days. Pt states she noted streaks of blood in her emesis.

## 2020-07-30 ENCOUNTER — Other Ambulatory Visit: Payer: Self-pay

## 2020-07-30 ENCOUNTER — Emergency Department (HOSPITAL_BASED_OUTPATIENT_CLINIC_OR_DEPARTMENT_OTHER)
Admission: EM | Admit: 2020-07-30 | Discharge: 2020-07-30 | Disposition: A | Discharge status: Home / Self Care | Payer: BC Managed Care – PPO | Attending: Emergency Medicine | Admitting: Emergency Medicine

## 2020-07-30 ENCOUNTER — Encounter (HOSPITAL_BASED_OUTPATIENT_CLINIC_OR_DEPARTMENT_OTHER): Payer: Self-pay | Admitting: *Deleted

## 2020-07-30 DIAGNOSIS — H6122 Impacted cerumen, left ear: Secondary | ICD-10-CM | POA: Diagnosis not present

## 2020-07-30 DIAGNOSIS — I10 Essential (primary) hypertension: Secondary | ICD-10-CM | POA: Insufficient documentation

## 2020-07-30 DIAGNOSIS — H9192 Unspecified hearing loss, left ear: Secondary | ICD-10-CM | POA: Diagnosis present

## 2020-07-30 DIAGNOSIS — J3489 Other specified disorders of nose and nasal sinuses: Secondary | ICD-10-CM | POA: Insufficient documentation

## 2020-07-30 DIAGNOSIS — R0981 Nasal congestion: Secondary | ICD-10-CM | POA: Diagnosis not present

## 2020-07-30 NOTE — Discharge Instructions (Signed)
Your history and exam are consistent with an earwax impaction on the left side causing your symptoms and decreased hearing.  After successful removal, your symptoms resolved.  We feel you are safe for discharge home to follow-up with your PCP.  Please rest and stay hydrated.

## 2020-07-30 NOTE — ED Provider Notes (Signed)
[facility] EMERGENCY DEPARTMENT Provider Note   CSN: [id number] Arrival date & time: 07/30/20  1608     History Chief Complaint  Patient presents with  . Hearing Loss    left ear    Frances Mccormick is a 34 y.o. female.  The history is provided by the patient and medical records. No language interpreter was used.  Ear Fullness This is a new problem. The current episode started 1 to 2 hours ago. The problem occurs constantly. The problem has not changed since onset.Pertinent negatives include no chest pain, no abdominal pain, no headaches and no shortness of breath. Nothing aggravates the symptoms. Nothing relieves the symptoms. She has tried nothing for the symptoms. The treatment provided no relief.       Past Medical History:  Diagnosis Date  . Crohn's colitis ([place])   . Migraine    "a few/year" (07/27/2018)  . Non-compliant patient     Patient Active Problem List   Diagnosis Date Noted  . Hypertension 09/30/2018  . Hypokalemia 09/30/2018  . Leukocytosis 09/30/2018  . Pilonidal abscess 09/30/2018  . Intractable nausea and vomiting 09/30/2018  . Chronic hypertension during pregnancy, antepartum 08/11/2018  . Nausea & vomiting 07/26/2018  . Medically noncompliant 10/26/2014    Past Surgical History:  Procedure Laterality Date  . INDUCED ABORTION       OB History    Gravida  2   Para  0   Term      Preterm      AB  1   Living        SAB      TAB      Ectopic      Multiple      Live Births              Family History  Problem Relation Age of Onset  . Diabetes Mother   . Diabetes Father     Social History   Tobacco Use  . Smoking status: Never Smoker  . Smokeless tobacco: Never Used  Vaping Use  . Vaping Use: Never used  Substance Use Topics  . Alcohol use: Yes    Alcohol/week: 2.0 standard drinks    Types: 1 Glasses of wine, 1 Cans of beer per week  . Drug use: Yes    Types: Marijuana    Comment: occaisonally      Home Medications Prior to Admission medications   Medication Sig Start Date End Date Taking? Authorizing Provider  acetaminophen (TYLENOL) 500 MG tablet Take 500 mg by mouth 2 (two) times daily as needed (pain).    [provider]  dicyclomine (BENTYL) 20 MG tablet Take 1 tablet (20 mg total) by mouth 2 (two) times daily. 03/24/20   Volanda Napoleon, PA-C  ondansetron (ZOFRAN ODT) 4 MG disintegrating tablet Take 1 tablet (4 mg total) by mouth every 8 (eight) hours as needed for nausea or vomiting. 03/24/20   Volanda Napoleon, PA-C  famotidine (PEPCID) 20 MG tablet Take 1 tablet (20 mg total) by mouth 2 (two) times daily. Patient not taking: Reported on 03/02/2020 10/03/18 03/23/20  Debbe Odea, MD  metoCLOPramide (REGLAN) 10 MG tablet Take 1 tablet (10 mg total) by mouth every 8 (eight) hours as needed for nausea. Patient not taking: Reported on 03/02/2020 10/03/18 03/23/20  Debbe Odea, MD  potassium chloride SA (K-DUR,KLOR-CON) 20 MEQ tablet Take 2 tablets (40 mEq total) by mouth daily for 4 days. Patient not taking: Reported on  03/02/2020 10/03/18 03/23/20  Debbe Odea, MD  promethazine (PHENERGAN) 25 MG tablet Take 0.5-1 tablets (12.5-25 mg total) by mouth at bedtime as needed for nausea. Patient not taking: Reported on 03/02/2020 10/03/18 03/23/20  Debbe Odea, MD    Allergies    Bee venom and Ciprofloxacin  Review of Systems   Review of Systems  Constitutional: Negative for chills, diaphoresis, fatigue and fever.  HENT: Positive for ear pain (ear fullness and decreased hearing), hearing loss and rhinorrhea. Negative for congestion, facial swelling, sinus pain, sore throat, tinnitus and trouble swallowing.   Respiratory: Negative for chest tightness and shortness of breath.   Cardiovascular: Negative for chest pain.  Gastrointestinal: Negative for abdominal pain, constipation, diarrhea, nausea and vomiting.  Genitourinary: Negative for dysuria, flank pain and frequency.   Musculoskeletal: Negative for back pain, neck pain and neck stiffness.  Skin: Negative for wound.  Neurological: Negative for headaches.  Psychiatric/Behavioral: Negative for agitation.  All other systems reviewed and are negative.   Physical Exam Updated Vital Signs BP 123/83 (BP Location: Right Arm)   Pulse 68   Temp 98.5 F (36.9 C) (Oral)   Resp 15   Ht 5' 5"  (1.651 m)   Wt 90.7 kg   LMP 07/24/2020   SpO2 93%   BMI 33.28 kg/m   Physical Exam Vitals and nursing note reviewed.  Constitutional:      General: She is not in acute distress.    Appearance: She is well-developed. She is not ill-appearing, toxic-appearing or diaphoretic.  HENT:     Head: Normocephalic and atraumatic.     Right Ear: External ear normal.     Left Ear: There is impacted cerumen.     Nose: Congestion and rhinorrhea present.     Mouth/Throat:     Mouth: Mucous membranes are moist.     Pharynx: No oropharyngeal exudate or posterior oropharyngeal erythema.  Eyes:     Extraocular Movements: Extraocular movements intact.     Conjunctiva/sclera: Conjunctivae normal.     Pupils: Pupils are equal, round, and reactive to light.  Cardiovascular:     Rate and Rhythm: Normal rate.     Pulses: Normal pulses.     Heart sounds: No murmur heard.   Pulmonary:     Effort: Pulmonary effort is normal. No respiratory distress.     Breath sounds: No stridor. No wheezing, rhonchi or rales.  Chest:     Chest wall: No tenderness.  Abdominal:     General: Abdomen is flat. There is no distension.     Tenderness: There is no abdominal tenderness. There is no right CVA tenderness, left CVA tenderness, guarding or rebound.  Musculoskeletal:        General: No tenderness.     Cervical back: Normal range of motion and neck supple. No tenderness.     Right lower leg: No edema.     Left lower leg: No edema.  Skin:    General: Skin is warm.     Capillary Refill: Capillary refill takes less than 2 seconds.      Findings: No erythema or rash.  Neurological:     Mental Status: She is alert and oriented to person, place, and time.     Motor: No abnormal muscle tone.     Deep Tendon Reflexes: Reflexes are normal and symmetric.  Psychiatric:        Mood and Affect: Mood normal.     ED Results / Procedures / Treatments   Labs (all  labs ordered are listed, but only abnormal results are displayed) Labs Reviewed - No data to display  EKG None  Radiology No results found.  Procedures .Ear Cerumen Removal  Date/Time: 07/30/2020 10:27 PM Performed by: Courtney Paris, MD Authorized by: Courtney Paris, MD   Consent:    Consent obtained:  Verbal   Consent given by:  Patient   Risks discussed:  Bleeding, infection, pain, TM perforation and incomplete removal   Alternatives discussed:  No treatment Procedure details:    Location:  L ear   Procedure type: curette   Post-procedure details:    Inspection:  TM intact   Hearing quality:  Normal   Patient tolerance of procedure:  Tolerated well, no immediate complications   (including critical care time)  Medications Ordered in ED Medications - No data to display  ED Course  I have reviewed the triage vital signs and the nursing notes.  Pertinent labs & imaging results that were available during my care of the patient were reviewed by me and considered in my medical decision making (see chart for details).    MDM Rules/Calculators/A&P                          Frances Mccormick is a 34 y.o. female with a past medical history significant for migraines and Crohn's colitis who presents with left ear fullness and decreased hearing for the last few hours.  She reports she has had some congestion and rhinorrhea recently and felt her left ear was stopped up today.  She wanted it checked.  She denies any trauma.  She denies any fevers, chills, chest pain, shortness breath, or cough.  Denies any pain with swallowing.  No other  complaints.  No right-sided symptoms.  On exam, patient does have evidence of cerumen impaction on the left side.  No mastoid tenderness.  No right-sided abnormalities on ear exam.  Patient did have some congestion and rhinorrhea present.  No evidence of PTA or RPA.  Neck range of motion with normal tenderness.  Lungs clear and chest nontender.  Abdomen nontender.  Patient resting comfortably.  Left cerumen impaction was removed at the bedside without difficulty.  Patient tolerated well.  After procedure, TM was intact with some erythema but no evidence of otitis media.  No evidence of otitis externa or mastoiditis.  Patient reports her symptoms completely resolved after it was removed.  She could hear normally.  Patient encouraged to improve her ear hygiene and follow-up with PCP.  She understood return precautions and follow-up instructions and was discharged in good condition with resolution of presenting symptoms.   Final Clinical Impression(s) / ED Diagnoses Final diagnoses:  Impacted cerumen of left ear    Rx / DC Orders ED Discharge Orders    None     Clinical Impression: 1. Impacted cerumen of left ear     Disposition: Discharge  Condition: Good  I have discussed the results, Dx and Tx plan with the pt(& family if present). He/she/they expressed understanding and agree(s) with the plan. Discharge instructions discussed at great length. Strict return precautions discussed and pt &/or family have verbalized understanding of the instructions. No further questions at time of discharge.    New Prescriptions   No medications on file    Follow Up: [facility] [address] [place] [id number] [phone] Schedule an appointment as soon as possible for a visit  [facility] [address] [id number] mc [address] [place] [phone]       Naif Alabi, Gwenyth Allegra, MD 07/30/20 2231

## 2020-07-30 NOTE — ED Triage Notes (Signed)
C/o left ear " clogged" x 2 hrs

## 2021-06-07 ENCOUNTER — Other Ambulatory Visit: Payer: Self-pay

## 2021-06-07 ENCOUNTER — Emergency Department (HOSPITAL_BASED_OUTPATIENT_CLINIC_OR_DEPARTMENT_OTHER): Payer: BC Managed Care – PPO

## 2021-06-07 ENCOUNTER — Inpatient Hospital Stay (HOSPITAL_BASED_OUTPATIENT_CLINIC_OR_DEPARTMENT_OTHER)
Admission: EM | Admit: 2021-06-07 | Discharge: 2021-06-09 | DRG: 387 | Disposition: A | Discharge status: Home / Self Care | Payer: BC Managed Care – PPO | Attending: Family Medicine | Admitting: Family Medicine

## 2021-06-07 ENCOUNTER — Encounter (HOSPITAL_BASED_OUTPATIENT_CLINIC_OR_DEPARTMENT_OTHER): Payer: Self-pay | Admitting: Emergency Medicine

## 2021-06-07 DIAGNOSIS — R112 Nausea with vomiting, unspecified: Secondary | ICD-10-CM | POA: Diagnosis present

## 2021-06-07 DIAGNOSIS — K5090 Crohn's disease, unspecified, without complications: Secondary | ICD-10-CM

## 2021-06-07 DIAGNOSIS — K5010 Crohn's disease of large intestine without complications: Principal | ICD-10-CM | POA: Diagnosis present

## 2021-06-07 DIAGNOSIS — Z91030 Bee allergy status: Secondary | ICD-10-CM | POA: Diagnosis not present

## 2021-06-07 DIAGNOSIS — D72825 Bandemia: Secondary | ICD-10-CM

## 2021-06-07 DIAGNOSIS — K50919 Crohn's disease, unspecified, with unspecified complications: Secondary | ICD-10-CM | POA: Diagnosis not present

## 2021-06-07 DIAGNOSIS — Z79899 Other long term (current) drug therapy: Secondary | ICD-10-CM | POA: Diagnosis not present

## 2021-06-07 DIAGNOSIS — Z881 Allergy status to other antibiotic agents status: Secondary | ICD-10-CM | POA: Diagnosis not present

## 2021-06-07 DIAGNOSIS — Z20822 Contact with and (suspected) exposure to covid-19: Secondary | ICD-10-CM | POA: Diagnosis present

## 2021-06-07 DIAGNOSIS — R030 Elevated blood-pressure reading, without diagnosis of hypertension: Secondary | ICD-10-CM | POA: Diagnosis present

## 2021-06-07 DIAGNOSIS — K529 Noninfective gastroenteritis and colitis, unspecified: Secondary | ICD-10-CM | POA: Diagnosis present

## 2021-06-07 DIAGNOSIS — Z9119 Patient's noncompliance with other medical treatment and regimen: Secondary | ICD-10-CM | POA: Diagnosis not present

## 2021-06-07 DIAGNOSIS — D72829 Elevated white blood cell count, unspecified: Secondary | ICD-10-CM | POA: Diagnosis present

## 2021-06-07 LAB — URINALYSIS, ROUTINE W REFLEX MICROSCOPIC
Bilirubin Urine: NEGATIVE
Glucose, UA: NEGATIVE mg/dL
Ketones, ur: NEGATIVE mg/dL
Leukocytes,Ua: NEGATIVE
Nitrite: NEGATIVE
Protein, ur: NEGATIVE mg/dL
Specific Gravity, Urine: 1.015 (ref 1.005–1.030)
pH: 7.5 (ref 5.0–8.0)

## 2021-06-07 LAB — CBC WITH DIFFERENTIAL/PLATELET
Abs Immature Granulocytes: 0.1 10*3/uL — ABNORMAL HIGH (ref 0.00–0.07)
Basophils Absolute: 0.1 10*3/uL (ref 0.0–0.1)
Basophils Relative: 0 %
Eosinophils Absolute: 0 10*3/uL (ref 0.0–0.5)
Eosinophils Relative: 0 %
HCT: 44.5 % (ref 36.0–46.0)
Hemoglobin: 14.6 g/dL (ref 12.0–15.0)
Immature Granulocytes: 1 %
Lymphocytes Relative: 6 %
Lymphs Abs: 1.3 10*3/uL (ref 0.7–4.0)
MCH: 25.7 pg — ABNORMAL LOW (ref 26.0–34.0)
MCHC: 32.8 g/dL (ref 30.0–36.0)
MCV: 78.2 fL — ABNORMAL LOW (ref 80.0–100.0)
Monocytes Absolute: 0.5 10*3/uL (ref 0.1–1.0)
Monocytes Relative: 2 %
Neutro Abs: 19.2 10*3/uL — ABNORMAL HIGH (ref 1.7–7.7)
Neutrophils Relative %: 91 %
Platelets: 361 10*3/uL (ref 150–400)
RBC: 5.69 MIL/uL — ABNORMAL HIGH (ref 3.87–5.11)
RDW: 16.3 % — ABNORMAL HIGH (ref 11.5–15.5)
WBC: 21.2 10*3/uL — ABNORMAL HIGH (ref 4.0–10.5)
nRBC: 0 % (ref 0.0–0.2)

## 2021-06-07 LAB — RESP PANEL BY RT-PCR (FLU A&B, COVID) ARPGX2
Influenza A by PCR: NEGATIVE
Influenza B by PCR: NEGATIVE
SARS Coronavirus 2 by RT PCR: NEGATIVE

## 2021-06-07 LAB — COMPREHENSIVE METABOLIC PANEL
ALT: 16 U/L (ref 0–44)
AST: 27 U/L (ref 15–41)
Albumin: 4.4 g/dL (ref 3.5–5.0)
Alkaline Phosphatase: 75 U/L (ref 38–126)
Anion gap: 10 (ref 5–15)
BUN: 9 mg/dL (ref 6–20)
CO2: 24 mmol/L (ref 22–32)
Calcium: 8.9 mg/dL (ref 8.9–10.3)
Chloride: 102 mmol/L (ref 98–111)
Creatinine, Ser: 0.75 mg/dL (ref 0.44–1.00)
GFR, Estimated: >60 mL/min (ref >60)
Glucose, Bld: 122 mg/dL — ABNORMAL HIGH (ref 70–99)
Potassium: 3.7 mmol/L (ref 3.5–5.1)
Sodium: 136 mmol/L (ref 135–145)
Total Bilirubin: 0.3 mg/dL (ref 0.3–1.2)
Total Protein: 8.8 g/dL — ABNORMAL HIGH (ref 6.5–8.1)

## 2021-06-07 LAB — PREGNANCY, URINE: Preg Test, Ur: NEGATIVE

## 2021-06-07 LAB — URINALYSIS, MICROSCOPIC (REFLEX)

## 2021-06-07 LAB — LIPASE, BLOOD: Lipase: 28 U/L (ref 11–51)

## 2021-06-07 MED ORDER — ACETAMINOPHEN 650 MG RE SUPP: 650.0000 mg | Freq: Four times a day (QID) | RECTAL | Status: DC | PRN

## 2021-06-07 MED ORDER — PROMETHAZINE HCL 25 MG/ML IJ SOLN
INTRAMUSCULAR | Status: AC
  Filled 2021-06-07: qty 1

## 2021-06-07 MED ORDER — HYDROMORPHONE HCL 1 MG/ML IJ SOLN
1.0000 mg | Freq: Once | INTRAMUSCULAR | Status: AC
  Administered 2021-06-07: 1 mg via INTRAVENOUS
  Filled 2021-06-07: qty 1

## 2021-06-07 MED ORDER — LORAZEPAM 2 MG/ML IJ SOLN
0.5000 mg | Freq: Once | INTRAMUSCULAR | Status: AC
  Administered 2021-06-07: 0.5 mg via INTRAVENOUS
  Filled 2021-06-07: qty 1

## 2021-06-07 MED ORDER — ONDANSETRON 4 MG PO TBDP: 4.0000 mg | ORAL_TABLET | Freq: Once | ORAL | Status: DC

## 2021-06-07 MED ORDER — OXYCODONE HCL 5 MG PO TABS
5.0000 mg | ORAL_TABLET | ORAL | Status: DC | PRN
  Administered 2021-06-08: 5 mg via ORAL
  Filled 2021-06-07: qty 1

## 2021-06-07 MED ORDER — SODIUM CHLORIDE 0.9 % IV SOLN
INTRAVENOUS | Status: DC | PRN
  Administered 2021-06-07: 250 mL via INTRAVENOUS

## 2021-06-07 MED ORDER — ACETAMINOPHEN 325 MG PO TABS
650.0000 mg | ORAL_TABLET | Freq: Four times a day (QID) | ORAL | Status: DC | PRN
Start: 2021-06-07 — End: 2021-06-09
  Administered 2021-06-08: 650 mg via ORAL
  Filled 2021-06-07: qty 2

## 2021-06-07 MED ORDER — ONDANSETRON HCL 4 MG PO TABS: 4.0000 mg | ORAL_TABLET | Freq: Four times a day (QID) | ORAL | Status: DC | PRN

## 2021-06-07 MED ORDER — ONDANSETRON HCL 4 MG/2ML IJ SOLN
4.0000 mg | Freq: Four times a day (QID) | INTRAMUSCULAR | Status: DC | PRN
  Administered 2021-06-07 – 2021-06-09 (×3): 4 mg via INTRAVENOUS
  Filled 2021-06-07 (×4): qty 2

## 2021-06-07 MED ORDER — PIPERACILLIN-TAZOBACTAM 3.375 G IVPB 30 MIN
3.3750 g | Freq: Once | INTRAVENOUS | Status: AC
  Administered 2021-06-07: 3.375 g via INTRAVENOUS
  Filled 2021-06-07: qty 50

## 2021-06-07 MED ORDER — SODIUM CHLORIDE 0.9 % IV BOLUS
1000.0000 mL | Freq: Once | INTRAVENOUS | Status: AC
  Administered 2021-06-07: 1000 mL via INTRAVENOUS

## 2021-06-07 MED ORDER — LACTATED RINGERS IV SOLN: INTRAVENOUS | Status: DC

## 2021-06-07 MED ORDER — PIPERACILLIN-TAZOBACTAM 3.375 G IVPB
3.3750 g | Freq: Three times a day (TID) | INTRAVENOUS | Status: DC
  Administered 2021-06-07 – 2021-06-09 (×5): 3.375 g via INTRAVENOUS
  Filled 2021-06-07 (×6): qty 50

## 2021-06-07 MED ORDER — ENOXAPARIN SODIUM 40 MG/0.4ML IJ SOSY
40.0000 mg | PREFILLED_SYRINGE | INTRAMUSCULAR | Status: DC
  Administered 2021-06-08 – 2021-06-09 (×2): 40 mg via SUBCUTANEOUS
  Filled 2021-06-07 (×2): qty 0.4

## 2021-06-07 MED ORDER — MORPHINE SULFATE (PF) 2 MG/ML IV SOLN
2.0000 mg | INTRAVENOUS | Status: DC | PRN
  Administered 2021-06-07 – 2021-06-09 (×4): 2 mg via INTRAVENOUS
  Filled 2021-06-07 (×4): qty 1

## 2021-06-07 MED ORDER — SODIUM CHLORIDE 0.9 % IV SOLN
12.5000 mg | Freq: Once | INTRAVENOUS | Status: AC
  Administered 2021-06-07: 12.5 mg via INTRAVENOUS
  Filled 2021-06-07: qty 0.5

## 2021-06-07 MED ORDER — ONDANSETRON HCL 4 MG/2ML IJ SOLN
4.0000 mg | Freq: Once | INTRAMUSCULAR | Status: AC
  Administered 2021-06-07: 4 mg via INTRAVENOUS
  Filled 2021-06-07: qty 2

## 2021-06-07 MED ORDER — PANTOPRAZOLE SODIUM 40 MG IV SOLR
40.0000 mg | Freq: Every day | INTRAVENOUS | Status: DC
  Administered 2021-06-07 – 2021-06-09 (×3): 40 mg via INTRAVENOUS
  Filled 2021-06-07 (×3): qty 40

## 2021-06-07 MED ORDER — IOHEXOL 300 MG/ML  SOLN
100.0000 mL | Freq: Once | INTRAMUSCULAR | Status: AC | PRN
  Administered 2021-06-07: 100 mL via INTRAVENOUS

## 2021-06-07 MED ORDER — PROCHLORPERAZINE EDISYLATE 10 MG/2ML IJ SOLN
10.0000 mg | Freq: Four times a day (QID) | INTRAMUSCULAR | Status: DC | PRN
  Administered 2021-06-08 – 2021-06-09 (×3): 10 mg via INTRAVENOUS
  Filled 2021-06-07 (×3): qty 2

## 2021-06-07 NOTE — Progress Notes (Signed)
Pharmacy Antibiotic Note  Frances Mccormick is a 35 y.o. female admitted on 06/07/2021 with  colitis .  Pharmacy has been consulted for Zosyn dosing.  Plan: Zosyn 3.375g IV q8h (4 hour infusion). -Monitor renal function, clinical status, and antibiotic plan  Height: 5' 5"  (165.1 cm) Weight: 90 kg (198 lb 6.6 oz) IBW/kg (Calculated) : 57  Temp (24hrs), Avg:98.6 F (37 C), Min:98.5 F (36.9 C), Max:98.6 F (37 C)  Recent Labs  Lab 06/07/21 1217  WBC 21.2*  CREATININE 0.75    Estimated Creatinine Clearance: 108.8 mL/min (by C-G formula based on SCr of 0.75 mg/dL).    Allergies  Allergen Reactions   Bee Venom Anaphylaxis   Ciprofloxacin Anaphylaxis    Antimicrobials this admission: Zosyn7/8  >>   Thank you for allowing pharmacy to be a part of this patient's care.  Joetta Manners, PharmD, [facility] Emergency Medicine Clinical Pharmacist ED RPh Phone: [place]: [phone]

## 2021-06-07 NOTE — ED Notes (Signed)
Unable to obtain blood for lab draws, will administer IV Bolus and reattempt

## 2021-06-07 NOTE — ED Triage Notes (Signed)
Reports lower abdominal cramping that radiates into her back since yesterday.  Noted to be diaphoretic and actively vomiting in triage.

## 2021-06-07 NOTE — ED Provider Notes (Signed)
[facility] Provider Note   CSN: [id number] Arrival date & time: 06/07/21  0930     History Chief Complaint  Patient presents with   Abdominal Pain    Frances Mccormick is a 35 y.o. female with a past medical history significant for Crohn's not currently on any medications and migraine headaches who presents to the ED due to nausea, vomiting, and diarrhea associated with upper and lower abdominal pain that started earlier this morning. Patient notes nausea and vomiting started first then abdominal pain occurred a few hours later. Patient admits to greater than 10 episodes of nonbloody, nonbilious emesis and 2 episodes of nonbloody diarrhea.  Admits to generalized body aches, chills, and sore throat. No cough. Denies sick contacts and known COVID exposures.  She has received both her COVID vaccines however, not her booster shot.  Patient states it feels similar to a past Crohn's flare.  She has not followed up with GI in numerous years.  No previous abdominal operations.  Admits to frequent marijuana use. No history of cannabinoid hyperemesis syndrome.  Denies vaginal and urinary symptoms.  Denies chest pain or shortness of breath.  She admits to chills, but denies fever. No treatment prior to arrival.   History obtained from patient and past medical records. No interpreter used during encounter.       Past Medical History:  Diagnosis Date   Crohn's colitis ([place])    Migraine    "a few/year" (07/27/2018)   Non-compliant patient     Patient Active Problem List   Diagnosis Date Noted   Colitis 06/07/2021   Hypertension 09/30/2018   Hypokalemia 09/30/2018   Leukocytosis 09/30/2018   Pilonidal abscess 09/30/2018   Intractable nausea and vomiting 09/30/2018   Chronic hypertension during pregnancy, antepartum 08/11/2018   Nausea & vomiting 07/26/2018   Medically noncompliant 10/26/2014    Past Surgical History:  Procedure Laterality Date   INDUCED  ABORTION       OB History     Gravida  2   Para  0   Term      Preterm      AB  1   Living         SAB      IAB      Ectopic      Multiple      Live Births              Family History  Problem Relation Age of Onset   Diabetes Mother    Diabetes Father     Social History   Tobacco Use   Smoking status: Never   Smokeless tobacco: Never  Vaping Use   Vaping Use: Never used  Substance Use Topics   Alcohol use: Yes    Alcohol/week: 2.0 standard drinks    Types: 1 Glasses of wine, 1 Cans of beer per week   Drug use: Yes    Types: Marijuana    Comment: occaisonally     Home Medications Prior to Admission medications   Medication Sig Start Date End Date Taking? Authorizing Provider  acetaminophen (TYLENOL) 500 MG tablet Take 500 mg by mouth 2 (two) times daily as needed (pain).    [provider]  dicyclomine (BENTYL) 20 MG tablet Take 1 tablet (20 mg total) by mouth 2 (two) times daily. 03/24/20   Volanda Napoleon, PA-C  ondansetron (ZOFRAN ODT) 4 MG disintegrating tablet Take 1 tablet (4 mg total) by mouth every 8 (  eight) hours as needed for nausea or vomiting. 03/24/20   Volanda Napoleon, PA-C  famotidine (PEPCID) 20 MG tablet Take 1 tablet (20 mg total) by mouth 2 (two) times daily. Patient not taking: Reported on 03/02/2020 10/03/18 03/23/20  Debbe Odea, MD  metoCLOPramide (REGLAN) 10 MG tablet Take 1 tablet (10 mg total) by mouth every 8 (eight) hours as needed for nausea. Patient not taking: Reported on 03/02/2020 10/03/18 03/23/20  Debbe Odea, MD  potassium chloride SA (K-DUR,KLOR-CON) 20 MEQ tablet Take 2 tablets (40 mEq total) by mouth daily for 4 days. Patient not taking: Reported on 03/02/2020 10/03/18 03/23/20  Debbe Odea, MD  promethazine (PHENERGAN) 25 MG tablet Take 0.5-1 tablets (12.5-25 mg total) by mouth at bedtime as needed for nausea. Patient not taking: Reported on 03/02/2020 10/03/18 03/23/20  Debbe Odea, MD    Allergies     Bee venom and Ciprofloxacin  Review of Systems   Review of Systems  Constitutional:  Positive for chills. Negative for fever.  Respiratory:  Negative for shortness of breath.   Cardiovascular:  Negative for chest pain.  Gastrointestinal:  Positive for abdominal pain, diarrhea, nausea and vomiting. Negative for anal bleeding and blood in stool.  Genitourinary:  Negative for dysuria and vaginal discharge.   Physical Exam Updated Vital Signs BP (!) 166/114 (BP Location: Right Arm)   Pulse 64   Temp 98.6 F (37 C) (Oral)   Resp (!) 23   Ht 5' 5"  (1.651 m)   Wt 90 kg   LMP 05/28/2021   SpO2 100%   BMI 33.02 kg/m   Physical Exam Vitals and nursing note reviewed.  Constitutional:      General: She is not in acute distress.    Appearance: She is not ill-appearing.  HENT:     Head: Normocephalic.  Eyes:     Pupils: Pupils are equal, round, and reactive to light.  Cardiovascular:     Rate and Rhythm: Normal rate and regular rhythm.     Pulses: Normal pulses.     Heart sounds: Normal heart sounds. No murmur heard.   No friction rub. No gallop.  Pulmonary:     Effort: Pulmonary effort is normal.     Breath sounds: Normal breath sounds.  Abdominal:     General: Abdomen is flat. There is no distension.     Palpations: Abdomen is soft.     Tenderness: There is abdominal tenderness. There is guarding. There is no rebound.     Comments: Abdomen soft, nondistended with tenderness in epigastric and right lower quadrant with voluntary guarding.  Musculoskeletal:        General: Normal range of motion.     Cervical back: Neck supple.  Skin:    General: Skin is warm and dry.  Neurological:     General: No focal deficit present.     Mental Status: She is alert.  Psychiatric:        Mood and Affect: Mood normal.        Behavior: Behavior normal.    ED Results / Procedures / Treatments   Labs (all labs ordered are listed, but only abnormal results are displayed) Labs Reviewed   CBC WITH DIFFERENTIAL/PLATELET - Abnormal; Notable for the following components:      Result Value   WBC 21.2 (*)    RBC 5.69 (*)    MCV 78.2 (*)    MCH 25.7 (*)    RDW 16.3 (*)    Neutro Abs 19.2 (*)  Abs Immature Granulocytes 0.10 (*)    All other components within normal limits  COMPREHENSIVE METABOLIC PANEL - Abnormal; Notable for the following components:   Glucose, Bld 122 (*)    Total Protein 8.8 (*)    All other components within normal limits  URINALYSIS, ROUTINE W REFLEX MICROSCOPIC - Abnormal; Notable for the following components:   Color, Urine STRAW (*)    Hgb urine dipstick TRACE (*)    All other components within normal limits  URINALYSIS, MICROSCOPIC (REFLEX) - Abnormal; Notable for the following components:   Bacteria, UA FEW (*)    All other components within normal limits  RESP PANEL BY RT-PCR (FLU A&B, COVID) [id number]  GASTROINTESTINAL PANEL BY PCR, STOOL (REPLACES STOOL CULTURE)  C DIFFICILE QUICK SCREEN W PCR REFLEX    LIPASE, BLOOD  PREGNANCY, URINE    EKG EKG Interpretation  Date/Time:  Friday June 07 2021 13:53:08 EDT Ventricular Rate:  81 PR Interval:  166 QRS Duration: 86 QT Interval:  394 QTC Calculation: 458 R Axis:   52 Text Interpretation: Sinus rhythm Confirmed by Octaviano Glow [id number] on 06/07/2021 2:01:13 PM  Radiology CT ABDOMEN PELVIS W CONTRAST  Result Date: 06/07/2021 CLINICAL DATA:  Right lower quadrant abdominal pain with vomiting and diaphoresis today. EXAM: CT ABDOMEN AND PELVIS WITH CONTRAST TECHNIQUE: Multidetector CT imaging of the abdomen and pelvis was performed using the standard protocol following bolus administration of intravenous contrast. CONTRAST:  156m OMNIPAQUE IOHEXOL 300 MG/ML  SOLN COMPARISON:  Abdominopelvic CT 03/24/2020 and 09/12/2018. FINDINGS: Lower chest: Clear lung bases. No significant pleural or pericardial effusion. Hepatobiliary: Improvement in previously demonstrated focal fat adjacent to the falciform  ligament. The liver otherwise appears unremarkable without suspicious focal abnormality. No evidence of gallstones, gallbladder wall thickening or biliary dilatation. Pancreas: Unremarkable. No pancreatic ductal dilatation or surrounding inflammatory changes. Spleen: Normal in size without focal abnormality. Adrenals/Urinary Tract: Both adrenal glands appear normal. The kidneys appear normal without evidence of urinary tract calculus, suspicious lesion or hydronephrosis. No bladder abnormalities are seen. Stomach/Bowel: No enteric contrast administered. The stomach appears unremarkable for its degree of distention. The distal small bowel and colon are decompressed, limiting their assessment. Difficult to exclude mild distal small bowel and diffuse colonic wall thickening, but no surrounding inflammatory change. The appendix appears normal. Vascular/Lymphatic: There are no enlarged abdominal or pelvic lymph nodes. No significant vascular findings. Reproductive: Small collapsing right ovarian follicle with mild surrounding free pelvic fluid. Stable small posterior uterine fibroid. Other: Small umbilical hernia containing only fat. No generalized ascites, free air or focal extraluminal fluid collection identified. Musculoskeletal: No acute or significant osseous findings. IMPRESSION: 1. No evidence of appendicitis, bowel obstruction or perforation. 2. Difficult to exclude mild wall thickening of the distal small bowel and colon due to incomplete distension. This could reflect mild enterocolitis. No surrounding inflammatory changes. 3. Collapsing right ovarian follicle with small amount of surrounding free pelvic fluid. Small uterine fibroid. Electronically Signed   By: WRichardean SaleM.D.   On: 06/07/2021 13:29    Procedures Procedures   Medications Ordered in ED Medications  promethazine (PHENERGAN) 25 MG/ML injection (has no administration in time range)  piperacillin-tazobactam (ZOSYN) IVPB 3.375 g (0 g  Intravenous Stopped 06/07/21 1513)    Followed by  piperacillin-tazobactam (ZOSYN) IVPB 3.375 g (has no administration in time range)  sodium chloride 0.9 % bolus 1,000 mL (0 mLs Intravenous Stopped 06/07/21 1341)  ondansetron (ZOFRAN) injection 4 mg (4 mg Intravenous Given 06/07/21 1042)  iohexol (  OMNIPAQUE) 300 MG/ML solution 100 mL (100 mLs Intravenous Contrast Given 06/07/21 1111)  promethazine (PHENERGAN) 12.5 mg in sodium chloride 0.9 % 50 mL IVPB (12.5 mg Intravenous New Bag/Given 06/07/21 1212)  HYDROmorphone (DILAUDID) injection 1 mg (1 mg Intravenous Given 06/07/21 1341)    ED Course  I have reviewed the triage vital signs and the nursing notes.  Pertinent labs & imaging results that were available during my care of the patient were reviewed by me and considered in my medical decision making (see chart for details).  Clinical Course as of 06/07/21 1738  Fri Jun 07, 2021  1139 Preg Test, Ur: NEGATIVE [CA]  1238 WBC(!): 21.2 [CA]  1359 This was a 35 year old female [facility] abdominal pain onset this morning, persistent nausea and vomiting.  She has a history of Crohn's.  Her work-up in the ED including CT scan showed some bowel wall thickening, no clear evidence of colitis, or obstruction.  However her white blood cell count was quite elevated at 21K.  She was afebrile and not tachycardic.  No evidence of sepsis at this time.  Advised that we give her a round of IV antibiotics, and that should be admitted for persistent vomiting and possible colitis.  PA provider discussed the case with the gastroenterologist who did not advise any steroids at this time, they can be consulted by the hospital team once inpatient, if needed. [MT]    Clinical Course User Index [CA] Suzy Bouchard, PA-C [MT] Wyvonnia Dusky, MD   MDM Rules/Calculators/A&P                         35 year old female with a history of Crohn's not currently on any medications presents to the ED due to  nausea, vomiting, diarrhea associate with epigastric and right lower quadrant abdominal pain.  Upon arrival, patient afebrile, not tachycardic or hypoxic.  Abdomen soft, nondistended with epigastric and right lower quadrant tenderness with voluntary guarding.  No rebound.  No rigidity.  IV fluids and Zofran given.  Abdominal labs ordered.  CT abdomen to rule out appendicitis vs. Chron's flare vs other emergent intra-abdominal etiologies. Discussed patient with Dr. Langston Masker who evaluated patient at bedside and agrees with assessment and plan.   CBC significant for leukocytosis at 21.2.  CMP significant for hyperglycemia 122.  No anion gap.  Low suspicion for DKA.  Normal renal function.  Lipase normal at 28.  Doubt pancreatitis.  Urine pregnancy test negative.  COVID/influenza negative.  UA significant for trace hematuria with no signs of infection. CT abdomen personally reviewed which demonstrates:   IMPRESSION:  1. No evidence of appendicitis, bowel obstruction or perforation.  2. Difficult to exclude mild wall thickening of the distal small  bowel and colon due to incomplete distension. This could reflect  mild enterocolitis. No surrounding inflammatory changes.  3. Collapsing right ovarian follicle with small amount of  surrounding free pelvic fluid. Small uterine fibroid.   Discussed case with Dr. Watt Climes with GI who recommends starting with antibiotics and holding prednisone at this time. IV zosyn started.   Discussed case with Dr. Grandville Silos with [facility] who agrees to admit patient. GI stool panel and C. Diff test ordered per request.   Final Clinical Impression(s) / ED Diagnoses Final diagnoses:  Colitis    Rx / DC Orders ED Discharge Orders     None        Suzy Bouchard, PA-C 06/07/21 1739  Wyvonnia Dusky, MD 06/10/21 [phone]

## 2021-06-07 NOTE — H&P (Signed)
History and Physical  Patient Name: Frances Mccormick     NWG:[id number]    DOB: 1986-01-02    DOA: 06/07/2021 PCP: Patient, No Pcp Per (Inactive)  Patient coming from: Home  Chief Complaint: Intractable nausea and vomiting, abdominal pain    HPI: Frances Mccormick is a 35 y.o. female, with PMH of Crohn's who presented to the ER on 06/07/2021 with new onset intractable nausea vomiting abdominal pain.  Patient states around 6 AM this morning she woke up with nausea and vomiting, some lower back pain, general malaise.  She started having abdominal pain a few hours later.  She has had multiple episodes of nonbloody, bilious/yellowish emesis, 2 episodes of diarrhea, nonbloody.  She is also had associated malaise, body aches.  Denies any dyspnea.  She does not take any medication currently.  She has a previous history of Crohn's previously followed by GI, but has not seen in 5 years due to not having insurance.  She said her last Crohn's flare was over a year ago.  No previous abdominal surgeries.  Nothing seems to make her symptoms better.  Denies any abnormal p.o. intake.  The night prior to her symptoms, she was in her normal state of health.  Due to the persistence of her symptoms, she presented to outside ER for further evaluation.    ED course: -Vitals on admission: Afebrile, heart rate 63, respiratory rate 18, blood pressure 153/95, maintaining sats on room air -Labs on initial presentation: Sodium 136, potassium 3.7, chloride 102, bicarb 24, glucose 122, BUN 9, creatinine 0.75, lipase 28, WBC 21.2, hemoglobin 14.6, UA unremarkable, pregnancy test negative -Imaging obtained on admission: CT abdomen pelvis with contrast demonstrated enterocolitis -In the ED the patient was given Dilaudid, Ativan, Zofran, Zosyn, Phenergan.  GI was contacted by the ER and recommended IV antibiotics, and holding off on steroids.  Patient was transferred to [facility], and the hospitalist service was contacted  for further evaluation and management.     ROS: A complete and thorough 12 point review of systems obtained, negative listed in HPI.     Past Medical History:  Diagnosis Date   Crohn's colitis ([place])    Migraine    "a few/year" (07/27/2018)   Non-compliant patient     Past Surgical History:  Procedure Laterality Date   INDUCED ABORTION      Social History: Patient lives at home.  The patient walks without assistance.  Non smoker.  Allergies  Allergen Reactions   Bee Venom Anaphylaxis   Ciprofloxacin Anaphylaxis    Family history: family history includes Diabetes in her father and mother.  Prior to Admission medications   Medication Sig Start Date End Date Taking? Authorizing Provider  acetaminophen (TYLENOL) 500 MG tablet Take 500 mg by mouth 2 (two) times daily as needed (pain).    [provider]  famotidine (PEPCID) 20 MG tablet Take 1 tablet (20 mg total) by mouth 2 (two) times daily. Patient not taking: Reported on 03/02/2020 10/03/18 03/23/20  Debbe Odea, MD  metoCLOPramide (REGLAN) 10 MG tablet Take 1 tablet (10 mg total) by mouth every 8 (eight) hours as needed for nausea. Patient not taking: Reported on 03/02/2020 10/03/18 03/23/20  Debbe Odea, MD  potassium chloride SA (K-DUR,KLOR-CON) 20 MEQ tablet Take 2 tablets (40 mEq total) by mouth daily for 4 days. Patient not taking: Reported on 03/02/2020 10/03/18 03/23/20  Debbe Odea, MD  promethazine (PHENERGAN) 25 MG tablet Take 0.5-1 tablets (12.5-25 mg total) by mouth  at bedtime as needed for nausea. Patient not taking: Reported on 03/02/2020 10/03/18 03/23/20  Debbe Odea, MD       Physical Exam: BP (!) 144/88 (BP Location: Left Arm)   Pulse 81   Temp 98.6 F (37 C) (Oral)   Resp 18   Ht 5' 5"  (1.651 m)   Wt 90 kg   LMP 05/28/2021   SpO2 100%   BMI 33.02 kg/m   General appearance: Well-developed, adult female, alert but in distress secondary to nausea and vomiting Eyes: Anicteric, conjunctiva  pink, lids and lashes normal. PERRL.    ENT: No nasal deformity, discharge, epistaxis.  Hearing intact. OP moist without lesions.   Neck: No neck masses.  Trachea midline.  No thyromegaly/tenderness. Lymph: No cervical or supraclavicular lymphadenopathy. Skin: Warm and dry.  No jaundice.  No suspicious rashes or lesions. Cardiac: RRR, nl S1-S2, no murmurs appreciated.  No LE edema.  Radial and pedal pulses 2+ and symmetric. Respiratory: Normal respiratory rate and rhythm.  CTAB without rales or wheezes. Abdomen: Abdomen soft.  Slight tenderness with palpation. No ascites, distension, hepatosplenomegaly.   MSK: No deformities or effusions of the large joints of the upper or lower extremities bilaterally.  No cyanosis or clubbing. Neuro: Cranial nerves 2 through 12 grossly intact.  Sensation intact to light touch. Speech is fluent.  .        Labs on Admission:  I have personally reviewed following labs and imaging studies: CBC: Recent Labs  Lab 06/07/21 1217  WBC 21.2*  NEUTROABS 19.2*  HGB 14.6  HCT 44.5  MCV 78.2*  PLT 742   Basic Metabolic Panel: Recent Labs  Lab 06/07/21 1217  NA 136  K 3.7  CL 102  CO2 24  GLUCOSE 122*  BUN 9  CREATININE 0.75  CALCIUM 8.9   GFR: Estimated Creatinine Clearance: 108.8 mL/min (by C-G formula based on SCr of 0.75 mg/dL).  Liver Function Tests: Recent Labs  Lab 06/07/21 1217  AST 27  ALT 16  ALKPHOS 75  BILITOT 0.3  PROT 8.8*  ALBUMIN 4.4   Recent Labs  Lab 06/07/21 1217  LIPASE 28   No results for input(s): AMMONIA in the last 168 hours. Coagulation Profile: No results for input(s): INR, PROTIME in the last 168 hours. Cardiac Enzymes: No results for input(s): CKTOTAL, CKMB, CKMBINDEX, TROPONINI in the last 168 hours. BNP (last 3 results) No results for input(s): PROBNP in the last 8760 hours. HbA1C: No results for input(s): HGBA1C in the last 72 hours. CBG: No results for input(s): GLUCAP in the last 168  hours. Lipid Profile: No results for input(s): CHOL, HDL, LDLCALC, TRIG, CHOLHDL, LDLDIRECT in the last 72 hours. Thyroid Function Tests: No results for input(s): TSH, T4TOTAL, FREET4, T3FREE, THYROIDAB in the last 72 hours. Anemia Panel: No results for input(s): VITAMINB12, FOLATE, FERRITIN, TIBC, IRON, RETICCTPCT in the last 72 hours.   Recent Results (from the past 240 hour(s))  Resp Panel by RT-PCR (Flu A&B, Covid) Nasopharyngeal Swab     Status: None   Collection Time: 06/07/21 10:44 AM   Specimen: Nasopharyngeal Swab; Nasopharyngeal(NP) swabs in vial transport medium  Result Value Ref Range Status   SARS Coronavirus 2 by RT PCR NEGATIVE NEGATIVE Final    Comment: (NOTE) SARS-CoV-2 target nucleic acids are NOT DETECTED.  The SARS-CoV-2 RNA is generally detectable in upper respiratory specimens during the acute phase of infection. The lowest concentration of SARS-CoV-2 viral copies this assay can detect is 138 copies/mL. A  negative result does not preclude SARS-Cov-2 infection and should not be used as the sole basis for treatment or other patient management decisions. A negative result may occur with  improper specimen collection/handling, submission of specimen other than nasopharyngeal swab, presence of viral mutation(s) within the areas targeted by this assay, and inadequate number of viral copies(<138 copies/mL). A negative result must be combined with clinical observations, patient history, and epidemiological information. The expected result is Negative.  Fact Sheet for Patients:  EntrepreneurPulse.com.au  Fact Sheet for Healthcare Providers:  IncredibleEmployment.be  This test is no t yet approved or cleared by the [place] FDA and  has been authorized for detection and/or diagnosis of SARS-CoV-2 by FDA under an Emergency Use Authorization (EUA). This EUA will remain  in effect (meaning this test can be used) for the duration  of the COVID-19 declaration under Section 564(b)(1) of the Act, 21 U.S.C.section 360bbb-3(b)(1), unless the authorization is terminated  or revoked sooner.       Influenza A by PCR NEGATIVE NEGATIVE Final   Influenza B by PCR NEGATIVE NEGATIVE Final    Comment: (NOTE) The Xpert Xpress SARS-CoV-2/FLU/RSV plus assay is intended as an aid in the diagnosis of influenza from Nasopharyngeal swab specimens and should not be used as a sole basis for treatment. Nasal washings and aspirates are unacceptable for Xpert Xpress SARS-CoV-2/FLU/RSV testing.  Fact Sheet for Patients: EntrepreneurPulse.com.au  Fact Sheet for Healthcare Providers: IncredibleEmployment.be  This test is not yet approved or cleared by the [place] FDA and has been authorized for detection and/or diagnosis of SARS-CoV-2 by FDA under an Emergency Use Authorization (EUA). This EUA will remain in effect (meaning this test can be used) for the duration of the COVID-19 declaration under Section 564(b)(1) of the Act, 21 U.S.C. section 360bbb-3(b)(1), unless the authorization is terminated or revoked.  Performed at [facility], [address] [place], [place] [address]            Radiological Exams on Admission: Personally reviewed imaging which shows: CT abdomen pelvis demonstrated enterocolitis CT ABDOMEN PELVIS W CONTRAST  Result Date: 06/07/2021 CLINICAL DATA:  Right lower quadrant abdominal pain with vomiting and diaphoresis today. EXAM: CT ABDOMEN AND PELVIS WITH CONTRAST TECHNIQUE: Multidetector CT imaging of the abdomen and pelvis was performed using the standard protocol following bolus administration of intravenous contrast. CONTRAST:  143m OMNIPAQUE IOHEXOL 300 MG/ML  SOLN COMPARISON:  Abdominopelvic CT 03/24/2020 and 09/12/2018. FINDINGS: Lower chest: Clear lung bases. No significant pleural or pericardial effusion. Hepatobiliary: Improvement in previously  demonstrated focal fat adjacent to the falciform ligament. The liver otherwise appears unremarkable without suspicious focal abnormality. No evidence of gallstones, gallbladder wall thickening or biliary dilatation. Pancreas: Unremarkable. No pancreatic ductal dilatation or surrounding inflammatory changes. Spleen: Normal in size without focal abnormality. Adrenals/Urinary Tract: Both adrenal glands appear normal. The kidneys appear normal without evidence of urinary tract calculus, suspicious lesion or hydronephrosis. No bladder abnormalities are seen. Stomach/Bowel: No enteric contrast administered. The stomach appears unremarkable for its degree of distention. The distal small bowel and colon are decompressed, limiting their assessment. Difficult to exclude mild distal small bowel and diffuse colonic wall thickening, but no surrounding inflammatory change. The appendix appears normal. Vascular/Lymphatic: There are no enlarged abdominal or pelvic lymph nodes. No significant vascular findings. Reproductive: Small collapsing right ovarian follicle with mild surrounding free pelvic fluid. Stable small posterior uterine fibroid. Other: Small umbilical hernia containing only fat. No generalized ascites, free air or focal extraluminal  fluid collection identified. Musculoskeletal: No acute or significant osseous findings. IMPRESSION: 1. No evidence of appendicitis, bowel obstruction or perforation. 2. Difficult to exclude mild wall thickening of the distal small bowel and colon due to incomplete distension. This could reflect mild enterocolitis. No surrounding inflammatory changes. 3. Collapsing right ovarian follicle with small amount of surrounding free pelvic fluid. Small uterine fibroid. Electronically Signed   By: Richardean Sale M.D.   On: 06/07/2021 13:29          Assessment/Plan   1.  Enterocolitis -CT abdomen pelvis with contrast on admission demonstrated enterocolitis - Patient has history of  Crohn's -GI consulted in the ED and recommended IV antibiotics, holding off on steroids at this time - Antiemetics as warranted -IV Protonix - IV fluids - Pain control as warranted - Clear liquid diet  2.  Intractable nausea and vomiting -Likely secondary to enterocolitis, however could be cannabis hyperemesis syndrome,  - See further plans above  3.  History of Crohn's disease -History of Crohn's disease per patient but has not followed up with GI in over 5 years due to insurance issues -However GI note in 2016 says do not believe she has inflammatory bowel disease - See further plans above  4.  Leukocytosis -Likely related to enterocolitis or possibly reactive - See further plans above   DVT prophylaxis: Lovenox Code Status: Full Family Communication: Mother bedside Disposition Plan: Anticipate discharge home when medically optimized Consults called: GI contacted by ER Admission status: Inpatient     Medical decision making: Patient seen at 7:48 PM on 06/07/2021.   What exists of the patient's chart was reviewed in depth and summarized above.  Clinical condition: Fair.        Doran Heater [facility] Please page though [place] or Epic secure chat:  For password, contact charge nurse

## 2021-06-07 NOTE — ED Notes (Signed)
Very restless, up off stretcher pacing in room, appears very anxious, states "I hurt really bad and I am still throwing up", will discuss plan of care with ED MD

## 2021-06-07 NOTE — ED Notes (Signed)
IV attempt x3, unable to thread.  RN aware.

## 2021-06-07 NOTE — ED Notes (Signed)
Covid Swab obtained and to the lab

## 2021-06-07 NOTE — Care Plan (Signed)
Was called by ED physician at [facility] to admit patient 35 year old female history of Crohn's not currently on any medications has not seen a gastroenterologist in over 5 years, migraine headaches presenting with intractable nausea vomiting lower abdominal pain, diarrhea with no significant improvement on IV antiemetics, IV pain medications in the ED.  COVID-19 PCR negative.  CT abdomen and pelvis with concerns for possible colitis.  With a leukocytosis white count of 21.2. ED physician stated spoke with GI, Dr.Magod who recommended initiation of IV antibiotics, to hold on steroids at this time, supportive care and patient be admitted to the hospitalist service.  The EDP, GI recommended once patient is admitted if GI input is needed will need a formal GI consultation at that time.  Patient placed on IV Zosyn. Asked ED physician to check stool for C. difficile PCR, GI pathogen panel.  No charge.

## 2021-06-07 NOTE — ED Notes (Signed)
Hand off report to [facility]. ETA within 30 mins

## 2021-06-07 NOTE — ED Notes (Signed)
Patient transported to CT 

## 2021-06-08 DIAGNOSIS — K529 Noninfective gastroenteritis and colitis, unspecified: Secondary | ICD-10-CM

## 2021-06-08 LAB — CBC
HCT: 43.1 % (ref 36.0–46.0)
Hemoglobin: 14.2 g/dL (ref 12.0–15.0)
MCH: 25.6 pg — ABNORMAL LOW (ref 26.0–34.0)
MCHC: 32.9 g/dL (ref 30.0–36.0)
MCV: 77.8 fL — ABNORMAL LOW (ref 80.0–100.0)
Platelets: 357 10*3/uL (ref 150–400)
RBC: 5.54 MIL/uL — ABNORMAL HIGH (ref 3.87–5.11)
RDW: 16.1 % — ABNORMAL HIGH (ref 11.5–15.5)
WBC: 22.8 10*3/uL — ABNORMAL HIGH (ref 4.0–10.5)
nRBC: 0 % (ref 0.0–0.2)

## 2021-06-08 LAB — CBC WITH DIFFERENTIAL/PLATELET
Abs Immature Granulocytes: 0.11 10*3/uL — ABNORMAL HIGH (ref 0.00–0.07)
Basophils Absolute: 0.1 10*3/uL (ref 0.0–0.1)
Basophils Relative: 0 %
Eosinophils Absolute: 0 10*3/uL (ref 0.0–0.5)
Eosinophils Relative: 0 %
HCT: 45.6 % (ref 36.0–46.0)
Hemoglobin: 15 g/dL (ref 12.0–15.0)
Immature Granulocytes: 1 %
Lymphocytes Relative: 9 %
Lymphs Abs: 1.7 10*3/uL (ref 0.7–4.0)
MCH: 25.6 pg — ABNORMAL LOW (ref 26.0–34.0)
MCHC: 32.9 g/dL (ref 30.0–36.0)
MCV: 77.8 fL — ABNORMAL LOW (ref 80.0–100.0)
Monocytes Absolute: 1 10*3/uL (ref 0.1–1.0)
Monocytes Relative: 5 %
Neutro Abs: 16.5 10*3/uL — ABNORMAL HIGH (ref 1.7–7.7)
Neutrophils Relative %: 85 %
Platelets: 373 10*3/uL (ref 150–400)
RBC: 5.86 MIL/uL — ABNORMAL HIGH (ref 3.87–5.11)
RDW: 16.6 % — ABNORMAL HIGH (ref 11.5–15.5)
WBC: 19.4 10*3/uL — ABNORMAL HIGH (ref 4.0–10.5)
nRBC: 0 % (ref 0.0–0.2)

## 2021-06-08 LAB — COMPREHENSIVE METABOLIC PANEL
ALT: 15 U/L (ref 0–44)
AST: 20 U/L (ref 15–41)
Albumin: 4.1 g/dL (ref 3.5–5.0)
Alkaline Phosphatase: 61 U/L (ref 38–126)
Anion gap: 13 (ref 5–15)
BUN: 8 mg/dL (ref 6–20)
CO2: 20 mmol/L — ABNORMAL LOW (ref 22–32)
Calcium: 8.7 mg/dL — ABNORMAL LOW (ref 8.9–10.3)
Chloride: 100 mmol/L (ref 98–111)
Creatinine, Ser: 0.66 mg/dL (ref 0.44–1.00)
GFR, Estimated: >60 mL/min (ref >60)
Glucose, Bld: 123 mg/dL — ABNORMAL HIGH (ref 70–99)
Potassium: 3.2 mmol/L — ABNORMAL LOW (ref 3.5–5.1)
Sodium: 133 mmol/L — ABNORMAL LOW (ref 135–145)
Total Bilirubin: 0.9 mg/dL (ref 0.3–1.2)
Total Protein: 8.2 g/dL — ABNORMAL HIGH (ref 6.5–8.1)

## 2021-06-08 LAB — HIV ANTIBODY (ROUTINE TESTING W REFLEX): HIV Screen 4th Generation wRfx: NONREACTIVE

## 2021-06-08 LAB — PHOSPHORUS: Phosphorus: 2.9 mg/dL (ref 2.5–4.6)

## 2021-06-08 LAB — MAGNESIUM: Magnesium: 1.8 mg/dL (ref 1.7–2.4)

## 2021-06-08 MED ORDER — POTASSIUM CHLORIDE CRYS ER 20 MEQ PO TBCR
40.0000 meq | EXTENDED_RELEASE_TABLET | Freq: Once | ORAL | Status: AC
  Administered 2021-06-08: 40 meq via ORAL
  Filled 2021-06-08: qty 2

## 2021-06-08 MED ORDER — HYDRALAZINE HCL 20 MG/ML IJ SOLN: 10.0000 mg | INTRAMUSCULAR | Status: DC | PRN

## 2021-06-08 NOTE — Consult Note (Signed)
Referring Provider: [facility] Primary Care Physician:  Patient, No Pcp Per (Inactive) Primary Gastroenterologist:  Dr. Benson Norway  Reason for Consultation: Nausea, vomiting, abdominal pain, history of colitis  HPI: Frances Mccormick is a 35 y.o. female with questionable history of nonspecific colitis in the past presented to the hospital with nausea, vomiting and abdominal pain.  Patient with history of chronic leukocytosis as well.  CT abdomen pelvis with contrast IV only contrast questionable mild distal small bowel and diffuse colonic wall thickening without any surrounding inflammatory changes.  GI is consulted for further evaluation.  Patient seen and examined at bedside.  According to patient, she had acute episode of nausea, vomiting, diarrhea and abdominal cramps yesterday morning.  She only had 2 episodes of diarrhea which has resolved now.  No bowel movement since admission.  He had generalized abdominal cramps which is also resolved.  No further vomiting.  Continues to have intermittent nausea.  Had some chills but denies any fever.  Overall she is feeling much better since admission.  Patient with history of questionable Crohn's disease based on colonoscopy in 2012 showing few small bowel erosions.  Patient's erosions were thought to be from NSAID use, not from Crohn's disease.  Patient had multiple normal CT scan since then.  Past Medical History:  Diagnosis Date   Crohn's colitis ([place])    Migraine    "a few/year" (07/27/2018)   Non-compliant patient     Past Surgical History:  Procedure Laterality Date   INDUCED ABORTION      Prior to Admission medications   Medication Sig Start Date End Date Taking? Authorizing Provider  acetaminophen (TYLENOL) 500 MG tablet Take 500 mg by mouth 2 (two) times daily as needed (pain).    [provider]  famotidine (PEPCID) 20 MG tablet Take 1 tablet (20 mg total) by mouth 2 (two) times daily. Patient not taking: Reported on 03/02/2020 10/03/18  03/23/20  Debbe Odea, MD  metoCLOPramide (REGLAN) 10 MG tablet Take 1 tablet (10 mg total) by mouth every 8 (eight) hours as needed for nausea. Patient not taking: Reported on 03/02/2020 10/03/18 03/23/20  Debbe Odea, MD  potassium chloride SA (K-DUR,KLOR-CON) 20 MEQ tablet Take 2 tablets (40 mEq total) by mouth daily for 4 days. Patient not taking: Reported on 03/02/2020 10/03/18 03/23/20  Debbe Odea, MD  promethazine (PHENERGAN) 25 MG tablet Take 0.5-1 tablets (12.5-25 mg total) by mouth at bedtime as needed for nausea. Patient not taking: Reported on 03/02/2020 10/03/18 03/23/20  Debbe Odea, MD    Scheduled Meds:  enoxaparin (LOVENOX) injection  40 mg Subcutaneous Q24H   pantoprazole (PROTONIX) IV  40 mg Intravenous Daily   Continuous Infusions:  sodium chloride Stopped (06/08/21 5916)   lactated ringers 100 mL/hr at 06/08/21 0849   piperacillin-tazobactam (ZOSYN)  IV 3.375 g (06/08/21 3846)   PRN Meds:.sodium chloride, acetaminophen **OR** acetaminophen, morphine injection, ondansetron **OR** ondansetron (ZOFRAN) IV, oxyCODONE, prochlorperazine  Allergies as of 06/07/2021 - Review Complete 06/07/2021  Allergen Reaction Noted   Bee venom Anaphylaxis 08/02/2012   Ciprofloxacin Anaphylaxis 04/09/2012    Family History  Problem Relation Age of Onset   Diabetes Mother    Diabetes Father     Social History   Socioeconomic History   Marital status: Single    Spouse name: Not on file   Number of children: Not on file   Years of education: Not on file   Highest education level: Not on file  Occupational History   Not on file  Tobacco Use   Smoking status: Never   Smokeless tobacco: Never  Vaping Use   Vaping Use: Never used  Substance and Sexual Activity   Alcohol use: Yes    Alcohol/week: 2.0 standard drinks    Types: 1 Glasses of wine, 1 Cans of beer per week   Drug use: Yes    Types: Marijuana    Comment: occaisonally    Sexual activity: Yes    Birth  control/protection: Condom  Other Topics Concern   Not on file  Social History Narrative   Not on file   Social Determinants of Health   Financial Resource Strain: Not on file  Food Insecurity: Not on file  Transportation Needs: Not on file  Physical Activity: Not on file  Stress: Not on file  Social Connections: Not on file  Intimate Partner Violence: Not on file    Review of Systems: 12 point review of system is done which is negative except as mentioned in HPI  Physical Exam: Vital signs: Vitals:   06/08/21 0159 06/08/21 0609  BP: (!) 152/81 (!) 151/91  Pulse: 72 87  Resp: 18 18  Temp: 98.2 F (36.8 C) 98.8 F (37.1 C)  SpO2: 100% 100%     Physical Exam Constitutional:      General: She is not in acute distress.    Appearance: She is well-developed. She is not ill-appearing.  HENT:     Head: Normocephalic and atraumatic.  Eyes:     Extraocular Movements: Extraocular movements intact.  Cardiovascular:     Rate and Rhythm: Normal rate and regular rhythm.     Heart sounds: Normal heart sounds. No murmur heard. Pulmonary:     Effort: Pulmonary effort is normal. No respiratory distress.     Breath sounds: Normal breath sounds.  Abdominal:     General: Bowel sounds are normal. There is no distension.     Palpations: Abdomen is soft.     Tenderness: There is no abdominal tenderness.     Hernia: No hernia is present.  Skin:    General: Skin is warm.     Coloration: Skin is not jaundiced.     Comments: No lower extremity edema.  Neurological:     Mental Status: She is alert and oriented to person, place, and time.  Psychiatric:        Mood and Affect: Mood normal. Mood is not anxious.        Behavior: Behavior normal.     GI:  Lab Results: Recent Labs    06/07/21 1217 06/08/21 0239  WBC 21.2* 22.8*  HGB 14.6 14.2  HCT 44.5 43.1  PLT 361 357   BMET Recent Labs    06/07/21 1217 06/08/21 0239  NA 136 133*  K 3.7 3.2*  CL 102 100  CO2 24 20*   GLUCOSE 122* 123*  BUN 9 8  CREATININE 0.75 0.66  CALCIUM 8.9 8.7*   LFT Recent Labs    06/08/21 0239  PROT 8.2*  ALBUMIN 4.1  AST 20  ALT 15  ALKPHOS 61  BILITOT 0.9   PT/INR No results for input(s): LABPROT, INR in the last 72 hours.   Studies/Results: CT ABDOMEN PELVIS W CONTRAST  Result Date: 06/07/2021 CLINICAL DATA:  Right lower quadrant abdominal pain with vomiting and diaphoresis today. EXAM: CT ABDOMEN AND PELVIS WITH CONTRAST TECHNIQUE: Multidetector CT imaging of the abdomen and pelvis was performed using the standard protocol following bolus administration of intravenous contrast. CONTRAST:  148m OMNIPAQUE  IOHEXOL 300 MG/ML  SOLN COMPARISON:  Abdominopelvic CT 03/24/2020 and 09/12/2018. FINDINGS: Lower chest: Clear lung bases. No significant pleural or pericardial effusion. Hepatobiliary: Improvement in previously demonstrated focal fat adjacent to the falciform ligament. The liver otherwise appears unremarkable without suspicious focal abnormality. No evidence of gallstones, gallbladder wall thickening or biliary dilatation. Pancreas: Unremarkable. No pancreatic ductal dilatation or surrounding inflammatory changes. Spleen: Normal in size without focal abnormality. Adrenals/Urinary Tract: Both adrenal glands appear normal. The kidneys appear normal without evidence of urinary tract calculus, suspicious lesion or hydronephrosis. No bladder abnormalities are seen. Stomach/Bowel: No enteric contrast administered. The stomach appears unremarkable for its degree of distention. The distal small bowel and colon are decompressed, limiting their assessment. Difficult to exclude mild distal small bowel and diffuse colonic wall thickening, but no surrounding inflammatory change. The appendix appears normal. Vascular/Lymphatic: There are no enlarged abdominal or pelvic lymph nodes. No significant vascular findings. Reproductive: Small collapsing right ovarian follicle with mild surrounding  free pelvic fluid. Stable small posterior uterine fibroid. Other: Small umbilical hernia containing only fat. No generalized ascites, free air or focal extraluminal fluid collection identified. Musculoskeletal: No acute or significant osseous findings. IMPRESSION: 1. No evidence of appendicitis, bowel obstruction or perforation. 2. Difficult to exclude mild wall thickening of the distal small bowel and colon due to incomplete distension. This could reflect mild enterocolitis. No surrounding inflammatory changes. 3. Collapsing right ovarian follicle with small amount of surrounding free pelvic fluid. Small uterine fibroid. Electronically Signed   By: Richardean Sale M.D.   On: 06/07/2021 13:29    Impression/Plan: -Acute onset of nausea, vomiting, diarrhea and abdominal cramps.  CT scan showed questionable mild thickening of the distal small bowel and colon without any surrounding inflammatory changes.  Likely from viral enterocolitis. -History of terminal ileal erosions based on colonoscopy in 2012.  Thought to be from NSAID use.  Patient with ongoing intermittent ibuprofen use.  Recommendations -------------------------- -Patient is already feeling better.  Abdominal cramps has resolved.  No diarrhea since admission.  No vomiting. -Recommend advancing diet to full liquid diet, and then slowly advance as tolerated -If tolerating full liquid diet, recommend changing antibiotics to oral Augmentin for total 10 days -Recommend 4 weeks of PPI therapy on discharge given intermittent NSAID use. -Do not think this presentation is from inflammatory bowel disease. -She was advised to follow-up with primary gastroenterologist Dr. Benson Norway after discharge. -No further inpatient GI work-up planned.  GI will sign off.  Call us back if needed    LOS: 1 day   Otis Brace  MD, [place] 06/08/2021, 9:50 AM  Contact #  [phone]

## 2021-06-08 NOTE — Progress Notes (Signed)
PROGRESS NOTE    Frances Mccormick  JXB:[id number] DOB: Apr 06, 1986 DOA: 06/07/2021 PCP: Patient, No Pcp Per (Inactive)   Brief Narrative:  HPI: Frances Mccormick is a 35 y.o. female, with PMH of Crohn's who presented to the ER on 06/07/2021 with new onset intractable nausea vomiting abdominal pain.   Patient states around 6 AM this morning she woke up with nausea and vomiting, some lower back pain, general malaise.  She started having abdominal pain a few hours later.  She has had multiple episodes of nonbloody, bilious/yellowish emesis, 2 episodes of diarrhea, nonbloody.  She is also had associated malaise, body aches.  Denies any dyspnea.  She does not take any medication currently.  She has a previous history of Crohn's previously followed by GI, but has not seen in 5 years due to not having insurance.  She said her last Crohn's flare was over a year ago.  No previous abdominal surgeries.  Nothing seems to make her symptoms better.  Denies any abnormal p.o. intake.  The night prior to her symptoms, she was in her normal state of health.  Due to the persistence of her symptoms, she presented to outside ER for further evaluation.       ED course: -Vitals on admission: Afebrile, heart rate 63, respiratory rate 18, blood pressure 153/95, maintaining sats on room air -Labs on initial presentation: Sodium 136, potassium 3.7, chloride 102, bicarb 24, glucose 122, BUN 9, creatinine 0.75, lipase 28, WBC 21.2, hemoglobin 14.6, UA unremarkable, pregnancy test negative -Imaging obtained on admission: CT abdomen pelvis with contrast demonstrated enterocolitis -In the ED the patient was given Dilaudid, Ativan, Zofran, Zosyn, Phenergan.  GI was contacted by the ER and recommended IV antibiotics, and holding off on steroids.  Patient was transferred to [facility], and the hospitalist service was contacted for further evaluation and management.    Assessment & Plan:   Principal Problem:    Enterocolitis Active Problems:   Crohn's disease (HCC)   Leukocytosis   Intractable nausea and vomiting   Colitis  Enterocolitis/intractable nausea and vomiting/history of Crohn's disease: Evidence on CT abdomen and pelvis.  Patient already improving.  Pain now down to 5 whereas it was 10 yesterday.  Some nausea but no vomiting.  Has not had any BM since last 24 hours here.  Seen by GI.  They do not think that her presentation is secondary to Crohn's disease.  Recommended continuing antibiotics and advancing diet.  Follow-up with Dr. Jenell Milliner GI as outpatient after discharge.  Has significant leukocytosis which is stable but she is afebrile.  Elevated blood pressure: No prior known history of hypertension but blood pressure elevated both systolic and diastolic.  Will initiate on hydralazine as needed.  DVT prophylaxis: enoxaparin (LOVENOX) injection 40 mg Start: 06/08/21 1000   Code Status: Full Code  Family Communication:  None present at bedside.  Plan of care discussed with patient in length and he verbalized understanding and agreed with it.  Status is: Inpatient  Remains inpatient appropriate because:IV treatments appropriate due to intensity of illness or inability to take PO  Dispo: The patient is from: Home              Anticipated d/c is to: Home              Patient currently is not medically stable to d/c.   Difficult to place patient No        Estimated body mass index is 33.02 kg/m as  calculated from the following:   Height as of this encounter: 5' 5"  (1.651 m).   Weight as of this encounter: 90 kg.      Nutritional status:               Consultants:  GI  Procedures:  None  Antimicrobials:  Anti-infectives (From admission, onward)    Start     Dose/Rate Route Frequency Ordered Stop   06/07/21 2215  piperacillin-tazobactam (ZOSYN) IVPB 3.375 g       See Hyperspace for full Linked Orders Report.   3.375 g 12.5 mL/hr over 240 Minutes  Intravenous Every 8 hours 06/07/21 1341     06/07/21 1400  piperacillin-tazobactam (ZOSYN) IVPB 3.375 g       See Hyperspace for full Linked Orders Report.   3.375 g 100 mL/hr over 30 Minutes Intravenous  Once 06/07/21 1341 06/07/21 1513          Subjective: Seen and examined.  Feels better than yesterday.  Abdominal pain improved.  Some nausea but no vomiting or any diarrhea.  No other complaint.  Objective: Vitals:   06/07/21 1806 06/07/21 2126 06/08/21 0159 06/08/21 0609  BP: (!) 144/88 (!) 163/90 (!) 152/81 (!) 151/91  Pulse: 81 82 72 87  Resp: 18 20 18 18   Temp: 98.6 F (37 C) 98.4 F (36.9 C) 98.2 F (36.8 C) 98.8 F (37.1 C)  TempSrc: Oral Oral Oral Oral  SpO2: 100% 100% 100% 100%  Weight:      Height:        Intake/Output Summary (Last 24 hours) at 06/08/2021 1109 Last data filed at 06/08/2021 0900 Gross per 24 hour  Intake 1242.86 ml  Output 1650 ml  Net -407.14 ml   Filed Weights   06/07/21 0949  Weight: 90 kg    Examination:  General exam: Appears calm and comfortable  Respiratory system: Clear to auscultation. Respiratory effort normal. Cardiovascular system: S1 & S2 heard, RRR. No JVD, murmurs, rubs, gallops or clicks. No pedal edema. Gastrointestinal system: Abdomen is nondistended, soft and mild periumbilical tenderness. No organomegaly or masses felt. Normal bowel sounds heard. Central nervous system: Alert and oriented. No focal neurological deficits. Extremities: Symmetric 5 x 5 power. Skin: No rashes, lesions or ulcers Psychiatry: Judgement and insight appear normal. Mood & affect appropriate.    Data Reviewed: I have personally reviewed following labs and imaging studies  CBC: Recent Labs  Lab 06/07/21 1217 06/08/21 0239  WBC 21.2* 22.8*  NEUTROABS 19.2*  --   HGB 14.6 14.2  HCT 44.5 43.1  MCV 78.2* 77.8*  PLT 361 295   Basic Metabolic Panel: Recent Labs  Lab 06/07/21 1217 06/08/21 0239  NA 136 133*  K 3.7 3.2*  CL 102 100   CO2 24 20*  GLUCOSE 122* 123*  BUN 9 8  CREATININE 0.75 0.66  CALCIUM 8.9 8.7*  MG  --  1.8  PHOS  --  2.9   GFR: Estimated Creatinine Clearance: 108.8 mL/min (by C-G formula based on SCr of 0.66 mg/dL). Liver Function Tests: Recent Labs  Lab 06/07/21 1217 06/08/21 0239  AST 27 20  ALT 16 15  ALKPHOS 75 61  BILITOT 0.3 0.9  PROT 8.8* 8.2*  ALBUMIN 4.4 4.1   Recent Labs  Lab 06/07/21 1217  LIPASE 28   No results for input(s): AMMONIA in the last 168 hours. Coagulation Profile: No results for input(s): INR, PROTIME in the last 168 hours. Cardiac Enzymes: No results for input(s): CKTOTAL,  CKMB, CKMBINDEX, TROPONINI in the last 168 hours. BNP (last 3 results) No results for input(s): PROBNP in the last 8760 hours. HbA1C: No results for input(s): HGBA1C in the last 72 hours. CBG: No results for input(s): GLUCAP in the last 168 hours. Lipid Profile: No results for input(s): CHOL, HDL, LDLCALC, TRIG, CHOLHDL, LDLDIRECT in the last 72 hours. Thyroid Function Tests: No results for input(s): TSH, T4TOTAL, FREET4, T3FREE, THYROIDAB in the last 72 hours. Anemia Panel: No results for input(s): VITAMINB12, FOLATE, FERRITIN, TIBC, IRON, RETICCTPCT in the last 72 hours. Sepsis Labs: No results for input(s): PROCALCITON, LATICACIDVEN in the last 168 hours.  Recent Results (from the past 240 hour(s))  Resp Panel by RT-PCR (Flu A&B, Covid) Nasopharyngeal Swab     Status: None   Collection Time: 06/07/21 10:44 AM   Specimen: Nasopharyngeal Swab; Nasopharyngeal(NP) swabs in vial transport medium  Result Value Ref Range Status   SARS Coronavirus 2 by RT PCR NEGATIVE NEGATIVE Final    Comment: (NOTE) SARS-CoV-2 target nucleic acids are NOT DETECTED.  The SARS-CoV-2 RNA is generally detectable in upper respiratory specimens during the acute phase of infection. The lowest concentration of SARS-CoV-2 viral copies this assay can detect is 138 copies/mL. A negative result does not  preclude SARS-Cov-2 infection and should not be used as the sole basis for treatment or other patient management decisions. A negative result may occur with  improper specimen collection/handling, submission of specimen other than nasopharyngeal swab, presence of viral mutation(s) within the areas targeted by this assay, and inadequate number of viral copies(<138 copies/mL). A negative result must be combined with clinical observations, patient history, and epidemiological information. The expected result is Negative.  Fact Sheet for Patients:  EntrepreneurPulse.com.au  Fact Sheet for Healthcare Providers:  IncredibleEmployment.be  This test is no t yet approved or cleared by the [place] FDA and  has been authorized for detection and/or diagnosis of SARS-CoV-2 by FDA under an Emergency Use Authorization (EUA). This EUA will remain  in effect (meaning this test can be used) for the duration of the COVID-19 declaration under Section 564(b)(1) of the Act, 21 U.S.C.section 360bbb-3(b)(1), unless the authorization is terminated  or revoked sooner.       Influenza A by PCR NEGATIVE NEGATIVE Final   Influenza B by PCR NEGATIVE NEGATIVE Final    Comment: (NOTE) The Xpert Xpress SARS-CoV-2/FLU/RSV plus assay is intended as an aid in the diagnosis of influenza from Nasopharyngeal swab specimens and should not be used as a sole basis for treatment. Nasal washings and aspirates are unacceptable for Xpert Xpress SARS-CoV-2/FLU/RSV testing.  Fact Sheet for Patients: EntrepreneurPulse.com.au  Fact Sheet for Healthcare Providers: IncredibleEmployment.be  This test is not yet approved or cleared by the [place] FDA and has been authorized for detection and/or diagnosis of SARS-CoV-2 by FDA under an Emergency Use Authorization (EUA). This EUA will remain in effect (meaning this test can be used) for the  duration of the COVID-19 declaration under Section 564(b)(1) of the Act, 21 U.S.C. section 360bbb-3(b)(1), unless the authorization is terminated or revoked.  Performed at [facility], [address], [place],  [address]       Radiology Studies: CT ABDOMEN PELVIS W CONTRAST  Result Date: 06/07/2021 CLINICAL DATA:  Right lower quadrant abdominal pain with vomiting and diaphoresis today. EXAM: CT ABDOMEN AND PELVIS WITH CONTRAST TECHNIQUE: Multidetector CT imaging of the abdomen and pelvis was performed using the standard protocol following bolus administration of intravenous contrast.  CONTRAST:  161m OMNIPAQUE IOHEXOL 300 MG/ML  SOLN COMPARISON:  Abdominopelvic CT 03/24/2020 and 09/12/2018. FINDINGS: Lower chest: Clear lung bases. No significant pleural or pericardial effusion. Hepatobiliary: Improvement in previously demonstrated focal fat adjacent to the falciform ligament. The liver otherwise appears unremarkable without suspicious focal abnormality. No evidence of gallstones, gallbladder wall thickening or biliary dilatation. Pancreas: Unremarkable. No pancreatic ductal dilatation or surrounding inflammatory changes. Spleen: Normal in size without focal abnormality. Adrenals/Urinary Tract: Both adrenal glands appear normal. The kidneys appear normal without evidence of urinary tract calculus, suspicious lesion or hydronephrosis. No bladder abnormalities are seen. Stomach/Bowel: No enteric contrast administered. The stomach appears unremarkable for its degree of distention. The distal small bowel and colon are decompressed, limiting their assessment. Difficult to exclude mild distal small bowel and diffuse colonic wall thickening, but no surrounding inflammatory change. The appendix appears normal. Vascular/Lymphatic: There are no enlarged abdominal or pelvic lymph nodes. No significant vascular findings. Reproductive: Small collapsing right ovarian follicle with mild surrounding  free pelvic fluid. Stable small posterior uterine fibroid. Other: Small umbilical hernia containing only fat. No generalized ascites, free air or focal extraluminal fluid collection identified. Musculoskeletal: No acute or significant osseous findings. IMPRESSION: 1. No evidence of appendicitis, bowel obstruction or perforation. 2. Difficult to exclude mild wall thickening of the distal small bowel and colon due to incomplete distension. This could reflect mild enterocolitis. No surrounding inflammatory changes. 3. Collapsing right ovarian follicle with small amount of surrounding free pelvic fluid. Small uterine fibroid. Electronically Signed   By: WRichardean SaleM.D.   On: 06/07/2021 13:29    Scheduled Meds:  enoxaparin (LOVENOX) injection  40 mg Subcutaneous Q24H   pantoprazole (PROTONIX) IV  40 mg Intravenous Daily   Continuous Infusions:  sodium chloride Stopped (06/08/21 03016   lactated ringers 100 mL/hr at 06/08/21 0849   piperacillin-tazobactam (ZOSYN)  IV 3.375 g (06/08/21 00109     LOS: 1 day   Time spent: 33 minutes   RDarliss Cheney MD [facility]  06/08/2021, 11:09 AM   How to contact the TEdward [facility] Spar[facility] HospitalAttending or Consulting provider [place] covering provider during after hours 7[place] for this patient?  Check the care team in [facility] Incand look for a) attending/consulting [facility] provider listed and b) the TBay [facility] LLCteam listed. Page or secure chat [phone]. Log into [email or web address] and use [place]'s universal password to access. If you do not have the password, please contact the hospital operator. Locate the TGilliam Psychiatric Hospitalprovider you are looking for under [facility] and page to a number that you can be directly reached. If you still have difficulty reaching the provider, please page the [facility](Director on Call) for the Hospitalists listed on amion for assistance.

## 2021-06-08 NOTE — Progress Notes (Signed)
PHARMACY NOTE -  Frances Mccormick has been assisting with dosing of zosyn for colitis. Dosage remains stable at 3.375 g IV q8 hr and further renal adjustments per institutional Pharmacy antibiotic protocol  Pharmacy will sign off, following peripherally for culture results or dose adjustments. Please reconsult if a change in clinical status warrants re-evaluation of dosage.  Frances Mccormick, PharmD, BCPS [phone] 06/08/2021, 1:09 PM

## 2021-06-09 LAB — BASIC METABOLIC PANEL
Anion gap: 7 (ref 5–15)
BUN: 8 mg/dL (ref 6–20)
CO2: 25 mmol/L (ref 22–32)
Calcium: 8.7 mg/dL — ABNORMAL LOW (ref 8.9–10.3)
Chloride: 106 mmol/L (ref 98–111)
Creatinine, Ser: 0.89 mg/dL (ref 0.44–1.00)
GFR, Estimated: >60 mL/min (ref >60)
Glucose, Bld: 98 mg/dL (ref 70–99)
Potassium: 3.6 mmol/L (ref 3.5–5.1)
Sodium: 138 mmol/L (ref 135–145)

## 2021-06-09 LAB — C DIFFICILE QUICK SCREEN W PCR REFLEX
C Diff antigen: NEGATIVE
C Diff interpretation: NOT DETECTED
C Diff toxin: NEGATIVE

## 2021-06-09 MED ORDER — ONDANSETRON 4 MG PO TBDP: 4.0000 mg | ORAL_TABLET | Freq: Three times a day (TID) | ORAL | 0 refills | Status: DC | PRN

## 2021-06-09 MED ORDER — AMOXICILLIN-POT CLAVULANATE 875-125 MG PO TABS: 1.0000 | ORAL_TABLET | Freq: Two times a day (BID) | ORAL | 0 refills | Status: AC

## 2021-06-09 NOTE — Discharge Summary (Addendum)
Physician Discharge Summary  Frances Mccormick RSW:[id number] DOB: [birth year] DOA: 06/07/2021  PCP: Patient, No Pcp Per (Inactive)  Admit date: 06/07/2021 Discharge date: 06/09/2021 30 Day Unplanned Readmission Risk Score    Flowsheet Row ED to Hosp-Admission (Current) from 06/07/2021 in [facility]  30 Day Unplanned Readmission Risk Score (%) 13.14 Filed at 06/09/2021 0801       This score is the patient's risk of an unplanned readmission within 30 days of being discharged (0 -100%). The score is based on dignosis, age, lab data, medications, orders, and past utilization.   Low:  0-14.9   Medium: 15-21.9   High: 22-29.9   Extreme: 30 and above           Admitted From: Home Disposition: Home  Recommendations for Outpatient Follow-up:  Follow up with PCP in 1-2 weeks Follow-up with your primary gastroenterologist/Dr. Benson Norway in 1 week Please obtain BMP/CBC in one week Please follow up with your PCP on the following pending results: Unresulted Labs (From admission, onward)     Start     Ordered   06/14/21 0500  Creatinine, serum  (enoxaparin (LOVENOX)    CrCl >/= 30 ml/min)  Weekly,   R     Comments: while on enoxaparin therapy    06/07/21 1835   06/09/21 0938  Gastrointestinal Panel by PCR , Stool  (Gastrointestinal Panel by PCR, Stool                                                                                                                                                     **Does Not include CLOSTRIDIUM DIFFICILE testing. **If CDIFF testing is needed, place order from the "C Difficile Testing" order set.**)  Once,   R        06/09/21 0741   06/09/21 1829  Basic metabolic panel  Daily,   R      06/08/21 1115              Home Health: None Equipment/Devices: None  Discharge Condition: Stable CODE STATUS: Full code Diet recommendation: Regular  Subjective: Seen and examined earlier today.  She had 1 loose bowel movement today, after 36 hours.  She  was feeling fine.  When I saw her, she had some intermittent vomiting and she was worried about having loose bowel movement.  She had not eaten her breakfast yet.  She wanted to go home but wanted to see how she does with breakfast.  Right before noon, she ate half of her breakfast and felt better and requested to discharge.  Following HPI and ED course has been copied from my colleague hospitalist Dr. Guido Sander H&P.  HPI: Frances Mccormick is a 35 y.o. female, with PMH of Crohn's who presented to the ER on 06/07/2021 with new onset intractable nausea vomiting abdominal pain.  Patient states around 6 AM this morning she woke up with nausea and vomiting, some lower back pain, general malaise.  She started having abdominal pain a few hours later.  She has had multiple episodes of nonbloody, bilious/yellowish emesis, 2 episodes of diarrhea, nonbloody.  She is also had associated malaise, body aches.  Denies any dyspnea.  She does not take any medication currently.  She has a previous history of Crohn's previously followed by GI, but has not seen in 5 years due to not having insurance.  She said her last Crohn's flare was over a year ago.  No previous abdominal surgeries.  Nothing seems to make her symptoms better.  Denies any abnormal p.o. intake.  The night prior to her symptoms, she was in her normal state of health.  Due to the persistence of her symptoms, she presented to outside ER for further evaluation.       ED course: -Vitals on admission: Afebrile, heart rate 63, respiratory rate 18, blood pressure 153/95, maintaining sats on room air -Labs on initial presentation: Sodium 136, potassium 3.7, chloride 102, bicarb 24, glucose 122, BUN 9, creatinine 0.75, lipase 28, WBC 21.2, hemoglobin 14.6, UA unremarkable, pregnancy test negative -Imaging obtained on admission: CT abdomen pelvis with contrast demonstrated enterocolitis -In the ED the patient was given Dilaudid, Ativan, Zofran, Zosyn, Phenergan.  GI  was contacted by the ER and recommended IV antibiotics, and holding off on steroids.  Patient was transferred to [facility], and the hospitalist service was contacted for further evaluation and management.    Brief/Interim Summary: Patient was admitted under hospital service due to enterocolitis.  She was started on broad-spectrum IV antibiotics.  GI was consulted.  GI opined that her presentation was not likely secondary to IBD and recommended advancing diet and transitioning to oral Augmentin and discharging and follow-up with with her primary gastroenterologist Dr. Benson Norway as outpatient.  Diet was advanced, patient tolerated well.  She is feeling well.  Hemodynamically stable.  Electrolytes normal.  No abdominal pain or tenderness today.  C. difficile negative.  GI pathogen panel pending but I doubt that will be positive either.  She is being discharged in stable condition with 10 days of oral Augmentin per GI recommendations.  Discharge Diagnoses:  Principal Problem:   Enterocolitis Active Problems:   Crohn's disease ([place])   Leukocytosis   Intractable nausea and vomiting   Colitis    Discharge Instructions   Allergies as of 06/09/2021       Reactions   Bee Venom Anaphylaxis   Ciprofloxacin Anaphylaxis        Medication List     TAKE these medications    acetaminophen 500 MG tablet Commonly known as: TYLENOL Take 500 mg by mouth 2 (two) times daily as needed (pain).   amoxicillin-clavulanate 875-125 MG tablet Commonly known as: Augmentin Take 1 tablet by mouth 2 (two) times daily for 10 days.   ondansetron 4 MG disintegrating tablet Commonly known as: Zofran ODT Take 1 tablet (4 mg total) by mouth every 8 (eight) hours as needed for nausea or vomiting.        Follow-up Information     Carol Ada, MD Follow up in 1 week(s).   Specialty: Gastroenterology Contact information: [address] [place] [address] [phone]                 Allergies  Allergen Reactions   Bee Venom Anaphylaxis   Ciprofloxacin Anaphylaxis    Consultations: GI  Procedures/Studies: CT ABDOMEN PELVIS W CONTRAST  Result Date: 06/07/2021 CLINICAL DATA:  Right lower quadrant abdominal pain with vomiting and diaphoresis today. EXAM: CT ABDOMEN AND PELVIS WITH CONTRAST TECHNIQUE: Multidetector CT imaging of the abdomen and pelvis was performed using the standard protocol following bolus administration of intravenous contrast. CONTRAST:  139m OMNIPAQUE IOHEXOL 300 MG/ML  SOLN COMPARISON:  Abdominopelvic CT 03/24/2020 and 09/12/2018. FINDINGS: Lower chest: Clear lung bases. No significant pleural or pericardial effusion. Hepatobiliary: Improvement in previously demonstrated focal fat adjacent to the falciform ligament. The liver otherwise appears unremarkable without suspicious focal abnormality. No evidence of gallstones, gallbladder wall thickening or biliary dilatation. Pancreas: Unremarkable. No pancreatic ductal dilatation or surrounding inflammatory changes. Spleen: Normal in size without focal abnormality. Adrenals/Urinary Tract: Both adrenal glands appear normal. The kidneys appear normal without evidence of urinary tract calculus, suspicious lesion or hydronephrosis. No bladder abnormalities are seen. Stomach/Bowel: No enteric contrast administered. The stomach appears unremarkable for its degree of distention. The distal small bowel and colon are decompressed, limiting their assessment. Difficult to exclude mild distal small bowel and diffuse colonic wall thickening, but no surrounding inflammatory change. The appendix appears normal. Vascular/Lymphatic: There are no enlarged abdominal or pelvic lymph nodes. No significant vascular findings. Reproductive: Small collapsing right ovarian follicle with mild surrounding free pelvic fluid. Stable small posterior uterine fibroid. Other: Small umbilical hernia containing only fat. No generalized  ascites, free air or focal extraluminal fluid collection identified. Musculoskeletal: No acute or significant osseous findings. IMPRESSION: 1. No evidence of appendicitis, bowel obstruction or perforation. 2. Difficult to exclude mild wall thickening of the distal small bowel and colon due to incomplete distension. This could reflect mild enterocolitis. No surrounding inflammatory changes. 3. Collapsing right ovarian follicle with small amount of surrounding free pelvic fluid. Small uterine fibroid. Electronically Signed   By: WRichardean SaleM.D.   On: 06/07/2021 13:29     Discharge Exam: Vitals:   06/08/21 2200 06/09/21 0504  BP: 121/75 115/80  Pulse: 67 69  Resp: 17 16  Temp: 98.4 F (36.9 C) 98.3 F (36.8 C)  SpO2: 100% 100%   Vitals:   06/08/21 0609 06/08/21 1329 06/08/21 2200 06/09/21 0504  BP: (!) 151/91 130/86 121/75 115/80  Pulse: 87 78 67 69  Resp: 18 16 17 16   Temp: 98.8 F (37.1 C) 99.1 F (37.3 C) 98.4 F (36.9 C) 98.3 F (36.8 C)  TempSrc: Oral Oral Oral Oral  SpO2: 100% 100% 100% 100%  Weight:      Height:        General: Pt is alert, awake, not in acute distress Cardiovascular: RRR, S1/S2 +, no rubs, no gallops Respiratory: CTA bilaterally, no wheezing, no rhonchi Abdominal: Soft, NT, ND, bowel sounds + Extremities: no edema, no cyanosis    The results of significant diagnostics from this hospitalization (including imaging, microbiology, ancillary and laboratory) are listed below for reference.     Microbiology: Recent Results (from the past 240 hour(s))  Resp Panel by RT-PCR (Flu A&B, Covid) Nasopharyngeal Swab     Status: None   Collection Time: 06/07/21 10:44 AM   Specimen: Nasopharyngeal Swab; Nasopharyngeal(NP) swabs in vial transport medium  Result Value Ref Range Status   SARS Coronavirus 2 by RT PCR NEGATIVE NEGATIVE Final    Comment: (NOTE) SARS-CoV-2 target nucleic acids are NOT DETECTED.  The SARS-CoV-2 RNA is generally detectable in upper  respiratory specimens during the acute phase of infection. The lowest concentration of SARS-CoV-2 viral copies this assay can  detect is 138 copies/mL. A negative result does not preclude SARS-Cov-2 infection and should not be used as the sole basis for treatment or other patient management decisions. A negative result may occur with  improper specimen collection/handling, submission of specimen other than nasopharyngeal swab, presence of viral mutation(s) within the areas targeted by this assay, and inadequate number of viral copies(<138 copies/mL). A negative result must be combined with clinical observations, patient history, and epidemiological information. The expected result is Negative.  Fact Sheet for Patients:  EntrepreneurPulse.com.au  Fact Sheet for Healthcare Providers:  IncredibleEmployment.be  This test is no t yet approved or cleared by the [place] FDA and  has been authorized for detection and/or diagnosis of SARS-CoV-2 by FDA under an Emergency Use Authorization (EUA). This EUA will remain  in effect (meaning this test can be used) for the duration of the COVID-19 declaration under Section 564(b)(1) of the Act, 21 U.S.C.section 360bbb-3(b)(1), unless the authorization is terminated  or revoked sooner.       Influenza A by PCR NEGATIVE NEGATIVE Final   Influenza B by PCR NEGATIVE NEGATIVE Final    Comment: (NOTE) The Xpert Xpress SARS-CoV-2/FLU/RSV plus assay is intended as an aid in the diagnosis of influenza from Nasopharyngeal swab specimens and should not be used as a sole basis for treatment. Nasal washings and aspirates are unacceptable for Xpert Xpress SARS-CoV-2/FLU/RSV testing.  Fact Sheet for Patients: EntrepreneurPulse.com.au  Fact Sheet for Healthcare Providers: IncredibleEmployment.be  This test is not yet approved or cleared by the [place] FDA and has been  authorized for detection and/or diagnosis of SARS-CoV-2 by FDA under an Emergency Use Authorization (EUA). This EUA will remain in effect (meaning this test can be used) for the duration of the COVID-19 declaration under Section 564(b)(1) of the Act, 21 U.S.C. section 360bbb-3(b)(1), unless the authorization is terminated or revoked.  Performed at [facility], [address] [place], [place] [address]   C Difficile Quick Screen w PCR reflex     Status: None   Collection Time: 06/09/21  7:42 AM   Specimen: STOOL  Result Value Ref Range Status   C Diff antigen NEGATIVE NEGATIVE Final   C Diff toxin NEGATIVE NEGATIVE Final   C Diff interpretation No C. difficile detected.  Final    Comment: Performed at [facility], [place] [address] [place], [place] [address]     Labs: BNP (last 3 results) No results for input(s): BNP in the last 8760 hours. Basic Metabolic Panel: Recent Labs  Lab 06/07/21 1217 06/08/21 0239 06/09/21 0303  NA 136 133* 138  K 3.7 3.2* 3.6  CL 102 100 106  CO2 24 20* 25  GLUCOSE 122* 123* 98  BUN 9 8 8   CREATININE 0.75 0.66 0.89  CALCIUM 8.9 8.7* 8.7*  MG  --  1.8  --   PHOS  --  2.9  --    Liver Function Tests: Recent Labs  Lab 06/07/21 1217 06/08/21 0239  AST 27 20  ALT 16 15  ALKPHOS 75 61  BILITOT 0.3 0.9  PROT 8.8* 8.2*  ALBUMIN 4.4 4.1   Recent Labs  Lab 06/07/21 1217  LIPASE 28   No results for input(s): AMMONIA in the last 168 hours. CBC: Recent Labs  Lab 06/07/21 1217 06/08/21 0239 06/08/21 1202  WBC 21.2* 22.8* 19.4*  NEUTROABS 19.2*  --  16.5*  HGB 14.6 14.2 15.0  HCT 44.5 43.1 45.6  MCV 78.2* 77.8* 77.8*  PLT 361 357 373   Cardiac Enzymes: No results for input(s): CKTOTAL, CKMB, CKMBINDEX, TROPONINI in the last 168 hours. BNP: Invalid input(s): POCBNP CBG: No results for input(s): GLUCAP in the last 168 hours. D-Dimer No results for input(s): DDIMER in the last 72 hours. Hgb A1c No  results for input(s): HGBA1C in the last 72 hours. Lipid Profile No results for input(s): CHOL, HDL, LDLCALC, TRIG, CHOLHDL, LDLDIRECT in the last 72 hours. Thyroid function studies No results for input(s): TSH, T4TOTAL, T3FREE, THYROIDAB in the last 72 hours.  Invalid input(s): FREET3 Anemia work up No results for input(s): VITAMINB12, FOLATE, FERRITIN, TIBC, IRON, RETICCTPCT in the last 72 hours. Urinalysis    Component Value Date/Time   COLORURINE STRAW (A) 06/07/2021 1107   APPEARANCEUR CLEAR 06/07/2021 1107   LABSPEC 1.015 06/07/2021 1107   PHURINE 7.5 06/07/2021 1107   GLUCOSEU NEGATIVE 06/07/2021 1107   HGBUR TRACE (A) 06/07/2021 1107   BILIRUBINUR NEGATIVE 06/07/2021 1107   KETONESUR NEGATIVE 06/07/2021 1107   PROTEINUR NEGATIVE 06/07/2021 1107   UROBILINOGEN 0.2 06/08/2015 0736   NITRITE NEGATIVE 06/07/2021 1107   LEUKOCYTESUR NEGATIVE 06/07/2021 1107   Sepsis Labs Invalid input(s): PROCALCITONIN,  WBC,  LACTICIDVEN Microbiology Recent Results (from the past 240 hour(s))  Resp Panel by RT-PCR (Flu A&B, Covid) Nasopharyngeal Swab     Status: None   Collection Time: 06/07/21 10:44 AM   Specimen: Nasopharyngeal Swab; Nasopharyngeal(NP) swabs in vial transport medium  Result Value Ref Range Status   SARS Coronavirus 2 by RT PCR NEGATIVE NEGATIVE Final    Comment: (NOTE) SARS-CoV-2 target nucleic acids are NOT DETECTED.  The SARS-CoV-2 RNA is generally detectable in upper respiratory specimens during the acute phase of infection. The lowest concentration of SARS-CoV-2 viral copies this assay can detect is 138 copies/mL. A negative result does not preclude SARS-Cov-2 infection and should not be used as the sole basis for treatment or other patient management decisions. A negative result may occur with  improper specimen collection/handling, submission of specimen other than nasopharyngeal swab, presence of viral mutation(s) within the areas targeted by this assay, and  inadequate number of viral copies(<138 copies/mL). A negative result must be combined with clinical observations, patient history, and epidemiological information. The expected result is Negative.  Fact Sheet for Patients:  EntrepreneurPulse.com.au  Fact Sheet for Healthcare Providers:  IncredibleEmployment.be  This test is no t yet approved or cleared by the [place] FDA and  has been authorized for detection and/or diagnosis of SARS-CoV-2 by FDA under an Emergency Use Authorization (EUA). This EUA will remain  in effect (meaning this test can be used) for the duration of the COVID-19 declaration under Section 564(b)(1) of the Act, 21 U.S.C.section 360bbb-3(b)(1), unless the authorization is terminated  or revoked sooner.       Influenza A by PCR NEGATIVE NEGATIVE Final   Influenza B by PCR NEGATIVE NEGATIVE Final    Comment: (NOTE) The Xpert Xpress SARS-CoV-2/FLU/RSV plus assay is intended as an aid in the diagnosis of influenza from Nasopharyngeal swab specimens and should not be used as a sole basis for treatment. Nasal washings and aspirates are unacceptable for Xpert Xpress SARS-CoV-2/FLU/RSV testing.  Fact Sheet for Patients: EntrepreneurPulse.com.au  Fact Sheet for Healthcare Providers: IncredibleEmployment.be  This test is not yet approved or cleared by the [place] FDA and has been authorized for detection and/or diagnosis of SARS-CoV-2 by FDA under an Emergency Use Authorization (EUA). This EUA will remain in effect (meaning this test can be  used) for the duration of the COVID-19 declaration under Section 564(b)(1) of the Act, 21 U.S.C. section 360bbb-3(b)(1), unless the authorization is terminated or revoked.  Performed at [facility], [address] [place], [place] [address]   C Difficile Quick Screen w PCR reflex     Status: None   Collection Time: 06/09/21  7:42  AM   Specimen: STOOL  Result Value Ref Range Status   C Diff antigen NEGATIVE NEGATIVE Final   C Diff toxin NEGATIVE NEGATIVE Final   C Diff interpretation No C. difficile detected.  Final    Comment: Performed at [facility], [place] [address] [place], [place] [address]     Time coordinating discharge: Over 30 minutes  SIGNED:   Darliss Cheney, MD  [facility] 06/09/2021, 1:25 PM  If 7PM-7AM, please contact night-coverage [email or web address]

## 2021-06-09 NOTE — Progress Notes (Signed)
Patient discharged to home w/ family. Given all belongings, instructions. Verbalized understanding of all instructions. Escorted to pov via w/c.

## 2021-06-10 LAB — GASTROINTESTINAL PANEL BY PCR, STOOL (REPLACES STOOL CULTURE)

## 2021-06-22 ENCOUNTER — Emergency Department (HOSPITAL_BASED_OUTPATIENT_CLINIC_OR_DEPARTMENT_OTHER)
Admission: EM | Admit: 2021-06-22 | Discharge: 2021-06-22 | Disposition: A | Discharge status: Home / Self Care | Payer: BC Managed Care – PPO | Attending: Emergency Medicine | Admitting: Emergency Medicine

## 2021-06-22 ENCOUNTER — Encounter (HOSPITAL_BASED_OUTPATIENT_CLINIC_OR_DEPARTMENT_OTHER): Payer: Self-pay | Admitting: *Deleted

## 2021-06-22 ENCOUNTER — Emergency Department (HOSPITAL_BASED_OUTPATIENT_CLINIC_OR_DEPARTMENT_OTHER): Payer: BC Managed Care – PPO

## 2021-06-22 ENCOUNTER — Other Ambulatory Visit: Payer: Self-pay

## 2021-06-22 DIAGNOSIS — I10 Essential (primary) hypertension: Secondary | ICD-10-CM | POA: Insufficient documentation

## 2021-06-22 DIAGNOSIS — M7989 Other specified soft tissue disorders: Secondary | ICD-10-CM

## 2021-06-22 DIAGNOSIS — R791 Abnormal coagulation profile: Secondary | ICD-10-CM | POA: Insufficient documentation

## 2021-06-22 DIAGNOSIS — R2241 Localized swelling, mass and lump, right lower limb: Secondary | ICD-10-CM | POA: Diagnosis not present

## 2021-06-22 LAB — CBC WITH DIFFERENTIAL/PLATELET
Abs Immature Granulocytes: 0.04 10*3/uL (ref 0.00–0.07)
Basophils Absolute: 0.1 10*3/uL (ref 0.0–0.1)
Basophils Relative: 1 %
Eosinophils Absolute: 0.5 10*3/uL (ref 0.0–0.5)
Eosinophils Relative: 4 %
HCT: 38.6 % (ref 36.0–46.0)
Hemoglobin: 12.7 g/dL (ref 12.0–15.0)
Immature Granulocytes: 0 %
Lymphocytes Relative: 24 %
Lymphs Abs: 2.6 10*3/uL (ref 0.7–4.0)
MCH: 25.6 pg — ABNORMAL LOW (ref 26.0–34.0)
MCHC: 32.9 g/dL (ref 30.0–36.0)
MCV: 77.8 fL — ABNORMAL LOW (ref 80.0–100.0)
Monocytes Absolute: 0.7 10*3/uL (ref 0.1–1.0)
Monocytes Relative: 6 %
Neutro Abs: 7.2 10*3/uL (ref 1.7–7.7)
Neutrophils Relative %: 65 %
Platelets: 390 10*3/uL (ref 150–400)
RBC: 4.96 MIL/uL (ref 3.87–5.11)
RDW: 15.9 % — ABNORMAL HIGH (ref 11.5–15.5)
WBC: 11.1 10*3/uL — ABNORMAL HIGH (ref 4.0–10.5)
nRBC: 0 % (ref 0.0–0.2)

## 2021-06-22 LAB — COMPREHENSIVE METABOLIC PANEL
ALT: 12 U/L (ref 0–44)
AST: 17 U/L (ref 15–41)
Albumin: 3.2 g/dL — ABNORMAL LOW (ref 3.5–5.0)
Alkaline Phosphatase: 50 U/L (ref 38–126)
Anion gap: 7 (ref 5–15)
BUN: 8 mg/dL (ref 6–20)
CO2: 26 mmol/L (ref 22–32)
Calcium: 8.3 mg/dL — ABNORMAL LOW (ref 8.9–10.3)
Chloride: 104 mmol/L (ref 98–111)
Creatinine, Ser: 0.75 mg/dL (ref 0.44–1.00)
GFR, Estimated: >60 mL/min (ref >60)
Glucose, Bld: 97 mg/dL (ref 70–99)
Potassium: 3.4 mmol/L — ABNORMAL LOW (ref 3.5–5.1)
Sodium: 137 mmol/L (ref 135–145)
Total Bilirubin: 0.2 mg/dL — ABNORMAL LOW (ref 0.3–1.2)
Total Protein: 6.8 g/dL (ref 6.5–8.1)

## 2021-06-22 LAB — D-DIMER, QUANTITATIVE: D-Dimer, Quant: 0.52 ug/mL-FEU — ABNORMAL HIGH (ref 0.00–0.50)

## 2021-06-22 LAB — PROTIME-INR
INR: 1 (ref 0.8–1.2)
Prothrombin Time: 12.9 seconds (ref 11.4–15.2)

## 2021-06-22 NOTE — ED Provider Notes (Signed)
[facility] DEPART[facility] Provider Note   CSN: [id number] Arrival date & time: 06/22/21  0109     History Chief Complaint  Patient presents with   Leg Pain    Frances Mccormick is a 35 y.o. female.  The past several days, she has noticed that her legs have been swelling.  The left leg responded to elevation, but the right leg is swollen and painful especially in the back of the leg.  She denies any injury or change in routine.  She does spend 10 hours/day sitting for work.  No history of DVT or PE.  She does endorse some mild palpitations but no chest pain or shortness of breath.  The history is provided by the patient.  Leg Pain Location:  Leg Time since incident:  2 days Injury: no   Leg location:  R lower leg Pain details:    Quality:  Dull and aching   Radiates to:  Does not radiate   Severity:  Moderate   Onset quality:  Gradual   Duration:  2 days   Timing:  Constant   Progression:  Unchanged Chronicity:  New Prior injury to area:  No Relieved by:  Nothing Worsened by:  Bearing weight Ineffective treatments:  Elevation Associated symptoms: swelling   Associated symptoms: no back pain, no fever, no numbness and no tingling       Past Medical History:  Diagnosis Date   Crohn's colitis ([place])    Migraine    "a few/year" (07/27/2018)   Non-compliant patient     Patient Active Problem List   Diagnosis Date Noted   Colitis 06/07/2021   Enterocolitis 06/07/2021   Hypertension 09/30/2018   Hypokalemia 09/30/2018   Leukocytosis 09/30/2018   Pilonidal abscess 09/30/2018   Intractable nausea and vomiting 09/30/2018   Chronic hypertension during pregnancy, antepartum 08/11/2018   Nausea & vomiting 07/26/2018   Medically noncompliant 10/26/2014   Crohn's disease ([place]) 10/26/2014    Past Surgical History:  Procedure Laterality Date   INDUCED ABORTION       OB History     Gravida  2   Para  0   Term      Preterm      AB  1    Living         SAB      IAB      Ectopic      Multiple      Live Births              Family History  Problem Relation Age of Onset   Diabetes Mother    Diabetes Father     Social History   Tobacco Use   Smoking status: Never   Smokeless tobacco: Never  Vaping Use   Vaping Use: Never used  Substance Use Topics   Alcohol use: Yes    Alcohol/week: 2.0 standard drinks    Types: 1 Glasses of wine, 1 Cans of beer per week   Drug use: Yes    Types: Marijuana    Comment: occaisonally     Home Medications Prior to Admission medications   Medication Sig Start Date End Date Taking? Authorizing Provider  acetaminophen (TYLENOL) 500 MG tablet Take 500 mg by mouth 2 (two) times daily as needed (pain).    [provider]  ondansetron (ZOFRAN ODT) 4 MG disintegrating tablet Take 1 tablet (4 mg total) by mouth every 8 (eight) hours as needed for nausea or vomiting. 06/09/21  Darliss Cheney, MD  famotidine (PEPCID) 20 MG tablet Take 1 tablet (20 mg total) by mouth 2 (two) times daily. Patient not taking: Reported on 03/02/2020 10/03/18 03/23/20  Debbe Odea, MD  metoCLOPramide (REGLAN) 10 MG tablet Take 1 tablet (10 mg total) by mouth every 8 (eight) hours as needed for nausea. Patient not taking: Reported on 03/02/2020 10/03/18 03/23/20  Debbe Odea, MD  potassium chloride SA (K-DUR,KLOR-CON) 20 MEQ tablet Take 2 tablets (40 mEq total) by mouth daily for 4 days. Patient not taking: Reported on 03/02/2020 10/03/18 03/23/20  Debbe Odea, MD  promethazine (PHENERGAN) 25 MG tablet Take 0.5-1 tablets (12.5-25 mg total) by mouth at bedtime as needed for nausea. Patient not taking: Reported on 03/02/2020 10/03/18 03/23/20  Debbe Odea, MD    Allergies    Bee venom and Ciprofloxacin  Review of Systems   Review of Systems  Constitutional:  Negative for chills and fever.  HENT:  Negative for ear pain and sore throat.   Eyes:  Negative for pain and visual disturbance.   Respiratory:  Negative for cough and shortness of breath.   Cardiovascular:  Positive for palpitations and leg swelling. Negative for chest pain.  Gastrointestinal:  Negative for abdominal pain and vomiting.  Genitourinary:  Negative for dysuria and hematuria.  Musculoskeletal:  Negative for arthralgias and back pain.  Skin:  Negative for color change and rash.  Neurological:  Negative for seizures and syncope.  All other systems reviewed and are negative.  Physical Exam Updated Vital Signs BP 124/69 (BP Location: Right Arm)   Pulse 74   Temp 97.7 F (36.5 C) (Oral)   Resp 18   Ht 5' 2"  (1.575 m)   Wt 92.5 kg   LMP 05/28/2021   SpO2 100%   BMI 37.31 kg/m   Physical Exam Vitals and nursing note reviewed.  HENT:     Head: Normocephalic and atraumatic.  Eyes:     General: No scleral icterus. Pulmonary:     Effort: Pulmonary effort is normal. No respiratory distress.  Musculoskeletal:     Cervical back: Normal range of motion.     Comments: No pitting edema of the lower leg. Moderate tenderness to palpation at the posterior calf. Positive Homan sign. Increased posterior leg pain with ankle dorsiflexion. Pulses are normal. Sensation is normal.  Skin:    General: Skin is warm and dry.  Neurological:     General: No focal deficit present.     Mental Status: She is alert and oriented to person, place, and time.  Psychiatric:        Mood and Affect: Mood normal.    ED Results / Procedures / Treatments   Labs (all labs ordered are listed, but only abnormal results are displayed) Labs Reviewed  COMPREHENSIVE METABOLIC PANEL - Abnormal; Notable for the following components:      Result Value   Potassium 3.4 (*)    Calcium 8.3 (*)    Albumin 3.2 (*)    Total Bilirubin 0.2 (*)    All other components within normal limits  CBC WITH DIFFERENTIAL/PLATELET - Abnormal; Notable for the following components:   WBC 11.1 (*)    MCV 77.8 (*)    MCH 25.6 (*)    RDW 15.9 (*)    All  other components within normal limits  D-DIMER, QUANTITATIVE - Abnormal; Notable for the following components:   D-Dimer, Quant 0.52 (*)    All other components within normal limits  PROTIME-INR  EKG None  Radiology US Venous Img Lower Unilateral Right  Result Date: 06/22/2021 CLINICAL DATA:  Right foot and ankle swelling with calf pain for the past 2 days. EXAM: RIGHT LOWER EXTREMITY VENOUS DOPPLER ULTRASOUND TECHNIQUE: Gray-scale sonography with compression, as well as color and duplex ultrasound, were performed to evaluate the deep venous system(s) from the level of the common femoral vein through the popliteal and proximal calf veins. COMPARISON:  None. FINDINGS: VENOUS Normal compressibility of the common femoral, superficial femoral, and popliteal veins, as well as the visualized calf veins. Visualized portions of profunda femoral vein and great saphenous vein unremarkable. No filling defects to suggest DVT on grayscale or color Doppler imaging. Doppler waveforms show normal direction of venous flow, normal respiratory plasticity and response to augmentation. Limited views of the contralateral common femoral vein are unremarkable. OTHER None. Limitations: none IMPRESSION: Negative. Electronically Signed   By: Jacqulynn Cadet M.D.   On: 06/22/2021 10:40    Procedures Procedures   Medications Ordered in ED Medications - No data to display  ED Course  I have reviewed the triage vital signs and the nursing notes.  Pertinent labs & imaging results that were available during my care of the patient were reviewed by me and considered in my medical decision making (see chart for details).    MDM Rules/Calculators/A&P                           Frances Mccormick presents with right lower leg edema.  She was evaluated for evidence of DVT, and her study was normal.  It could be some dependent edema, calf strain, or other musculoskeletal pathology.  She is well-appearing in general.  She  will be discharged home with recommendations for symptomatic management to include elevation and compression.  Follow-up discussed. Final Clinical Impression(s) / ED Diagnoses Final diagnoses:  Right leg swelling    Rx / DC Orders ED Discharge Orders     None        Arnaldo Natal, MD 06/22/21 1124

## 2021-06-22 NOTE — ED Triage Notes (Signed)
Presents with rt foot and lower leg swelling, pain in rt foot/ RLE increases with walking. Obvious swelling noted to rt foot

## 2021-08-04 ENCOUNTER — Emergency Department (HOSPITAL_COMMUNITY): Payer: BC Managed Care – PPO

## 2021-08-04 ENCOUNTER — Inpatient Hospital Stay (HOSPITAL_COMMUNITY)
Admission: EM | Admit: 2021-08-04 | Discharge: 2021-08-08 | DRG: 387 | Disposition: A | Discharge status: Home / Self Care | Payer: BC Managed Care – PPO | Attending: Internal Medicine | Admitting: Internal Medicine

## 2021-08-04 ENCOUNTER — Encounter (HOSPITAL_COMMUNITY): Payer: Self-pay

## 2021-08-04 ENCOUNTER — Other Ambulatory Visit: Payer: Self-pay

## 2021-08-04 DIAGNOSIS — D72829 Elevated white blood cell count, unspecified: Secondary | ICD-10-CM | POA: Diagnosis present

## 2021-08-04 DIAGNOSIS — R112 Nausea with vomiting, unspecified: Secondary | ICD-10-CM

## 2021-08-04 DIAGNOSIS — Z79899 Other long term (current) drug therapy: Secondary | ICD-10-CM

## 2021-08-04 DIAGNOSIS — F419 Anxiety disorder, unspecified: Secondary | ICD-10-CM | POA: Diagnosis present

## 2021-08-04 DIAGNOSIS — Z91030 Bee allergy status: Secondary | ICD-10-CM

## 2021-08-04 DIAGNOSIS — K5090 Crohn's disease, unspecified, without complications: Secondary | ICD-10-CM | POA: Diagnosis present

## 2021-08-04 DIAGNOSIS — I10 Essential (primary) hypertension: Secondary | ICD-10-CM | POA: Diagnosis present

## 2021-08-04 DIAGNOSIS — Z6838 Body mass index (BMI) 38.0-38.9, adult: Secondary | ICD-10-CM | POA: Diagnosis not present

## 2021-08-04 DIAGNOSIS — Z20822 Contact with and (suspected) exposure to covid-19: Secondary | ICD-10-CM | POA: Diagnosis present

## 2021-08-04 DIAGNOSIS — E876 Hypokalemia: Secondary | ICD-10-CM | POA: Diagnosis present

## 2021-08-04 DIAGNOSIS — Z881 Allergy status to other antibiotic agents status: Secondary | ICD-10-CM

## 2021-08-04 DIAGNOSIS — K5010 Crohn's disease of large intestine without complications: Secondary | ICD-10-CM | POA: Diagnosis present

## 2021-08-04 LAB — COMPREHENSIVE METABOLIC PANEL
ALT: 14 U/L (ref 0–44)
AST: 20 U/L (ref 15–41)
Albumin: 4.3 g/dL (ref 3.5–5.0)
Alkaline Phosphatase: 63 U/L (ref 38–126)
Anion gap: 10 (ref 5–15)
BUN: 6 mg/dL (ref 6–20)
CO2: 22 mmol/L (ref 22–32)
Calcium: 8.6 mg/dL — ABNORMAL LOW (ref 8.9–10.3)
Chloride: 105 mmol/L (ref 98–111)
Creatinine, Ser: 0.61 mg/dL (ref 0.44–1.00)
GFR, Estimated: >60 mL/min (ref >60)
Glucose, Bld: 108 mg/dL — ABNORMAL HIGH (ref 70–99)
Potassium: 3.2 mmol/L — ABNORMAL LOW (ref 3.5–5.1)
Sodium: 137 mmol/L (ref 135–145)
Total Bilirubin: 0.5 mg/dL (ref 0.3–1.2)
Total Protein: 8.6 g/dL — ABNORMAL HIGH (ref 6.5–8.1)

## 2021-08-04 LAB — CBC WITH DIFFERENTIAL/PLATELET
Abs Immature Granulocytes: 0.06 10*3/uL (ref 0.00–0.07)
Basophils Absolute: 0.1 10*3/uL (ref 0.0–0.1)
Basophils Relative: 1 %
Eosinophils Absolute: 0 10*3/uL (ref 0.0–0.5)
Eosinophils Relative: 0 %
HCT: 42.2 % (ref 36.0–46.0)
Hemoglobin: 14 g/dL (ref 12.0–15.0)
Immature Granulocytes: 0 %
Lymphocytes Relative: 6 %
Lymphs Abs: 0.9 10*3/uL (ref 0.7–4.0)
MCH: 25.4 pg — ABNORMAL LOW (ref 26.0–34.0)
MCHC: 33.2 g/dL (ref 30.0–36.0)
MCV: 76.4 fL — ABNORMAL LOW (ref 80.0–100.0)
Monocytes Absolute: 0.3 10*3/uL (ref 0.1–1.0)
Monocytes Relative: 2 %
Neutro Abs: 14.3 10*3/uL — ABNORMAL HIGH (ref 1.7–7.7)
Neutrophils Relative %: 91 %
Platelets: 364 10*3/uL (ref 150–400)
RBC: 5.52 MIL/uL — ABNORMAL HIGH (ref 3.87–5.11)
RDW: 16.5 % — ABNORMAL HIGH (ref 11.5–15.5)
WBC: 15.7 10*3/uL — ABNORMAL HIGH (ref 4.0–10.5)
nRBC: 0 % (ref 0.0–0.2)

## 2021-08-04 LAB — PREGNANCY, URINE: Preg Test, Ur: NEGATIVE

## 2021-08-04 LAB — URINALYSIS, ROUTINE W REFLEX MICROSCOPIC
Bilirubin Urine: NEGATIVE
Glucose, UA: NEGATIVE mg/dL
Ketones, ur: >80 mg/dL — AB
Leukocytes,Ua: NEGATIVE
Nitrite: NEGATIVE
Protein, ur: 100 mg/dL — AB
Specific Gravity, Urine: 1.02 (ref 1.005–1.030)
pH: 8 (ref 5.0–8.0)

## 2021-08-04 LAB — LIPASE, BLOOD: Lipase: 29 U/L (ref 11–51)

## 2021-08-04 MED ORDER — PROCHLORPERAZINE EDISYLATE 10 MG/2ML IJ SOLN
10.0000 mg | Freq: Once | INTRAMUSCULAR | Status: AC
  Administered 2021-08-04: 10 mg via INTRAVENOUS
  Filled 2021-08-04: qty 2

## 2021-08-04 MED ORDER — IOHEXOL 350 MG/ML SOLN
80.0000 mL | Freq: Once | INTRAVENOUS | Status: AC | PRN
  Administered 2021-08-04: 80 mL via INTRAVENOUS

## 2021-08-04 MED ORDER — SODIUM CHLORIDE 0.9 % IV BOLUS
1000.0000 mL | Freq: Once | INTRAVENOUS | Status: AC
  Administered 2021-08-04: 1000 mL via INTRAVENOUS

## 2021-08-04 MED ORDER — ACETAMINOPHEN 325 MG PO TABS: 650.0000 mg | ORAL_TABLET | Freq: Four times a day (QID) | ORAL | Status: DC | PRN

## 2021-08-04 MED ORDER — ACETAMINOPHEN 650 MG RE SUPP: 650.0000 mg | Freq: Four times a day (QID) | RECTAL | Status: DC | PRN

## 2021-08-04 MED ORDER — ENOXAPARIN SODIUM 40 MG/0.4ML IJ SOSY
40.0000 mg | PREFILLED_SYRINGE | INTRAMUSCULAR | Status: DC
  Administered 2021-08-05 – 2021-08-07 (×4): 40 mg via SUBCUTANEOUS
  Filled 2021-08-04 (×4): qty 0.4

## 2021-08-04 MED ORDER — ONDANSETRON HCL 4 MG/2ML IJ SOLN
4.0000 mg | Freq: Once | INTRAMUSCULAR | Status: AC
  Administered 2021-08-04: 4 mg via INTRAVENOUS
  Filled 2021-08-04: qty 2

## 2021-08-04 MED ORDER — METOCLOPRAMIDE HCL 5 MG/ML IJ SOLN
10.0000 mg | Freq: Once | INTRAMUSCULAR | Status: AC
  Administered 2021-08-04: 10 mg via INTRAVENOUS
  Filled 2021-08-04: qty 2

## 2021-08-04 MED ORDER — SODIUM CHLORIDE 0.9 % IV SOLN: INTRAVENOUS | Status: DC

## 2021-08-04 MED ORDER — PANTOPRAZOLE SODIUM 40 MG IV SOLR
40.0000 mg | Freq: Two times a day (BID) | INTRAVENOUS | Status: DC
  Administered 2021-08-04 – 2021-08-06 (×4): 40 mg via INTRAVENOUS
  Filled 2021-08-04 (×4): qty 40

## 2021-08-04 MED ORDER — ONDANSETRON HCL 4 MG/2ML IJ SOLN
4.0000 mg | Freq: Once | INTRAMUSCULAR | Status: AC
Start: 2021-08-04 — End: 2021-08-04
  Administered 2021-08-04: 4 mg via INTRAVENOUS
  Filled 2021-08-04: qty 2

## 2021-08-04 MED ORDER — OXYCODONE HCL 5 MG PO TABS
5.0000 mg | ORAL_TABLET | ORAL | Status: DC | PRN
  Administered 2021-08-05 – 2021-08-07 (×2): 5 mg via ORAL
  Filled 2021-08-04 (×3): qty 1

## 2021-08-04 MED ORDER — SODIUM CHLORIDE 0.9 % IV BOLUS
1000.0000 mL | Freq: Once | INTRAVENOUS | Status: AC
Start: 2021-08-04 — End: 2021-08-04
  Administered 2021-08-04: 1000 mL via INTRAVENOUS

## 2021-08-04 MED ORDER — DIAZEPAM 5 MG/ML IJ SOLN
2.5000 mg | Freq: Once | INTRAMUSCULAR | Status: AC
  Administered 2021-08-04: 2.5 mg via INTRAVENOUS
  Filled 2021-08-04: qty 2

## 2021-08-04 MED ORDER — MORPHINE SULFATE (PF) 2 MG/ML IV SOLN
2.0000 mg | INTRAVENOUS | Status: DC | PRN
Start: 2021-08-04 — End: 2021-08-08
  Administered 2021-08-05 – 2021-08-07 (×9): 2 mg via INTRAVENOUS
  Filled 2021-08-04 (×9): qty 1

## 2021-08-04 MED ORDER — MORPHINE SULFATE (PF) 4 MG/ML IV SOLN
4.0000 mg | Freq: Once | INTRAVENOUS | Status: AC
  Administered 2021-08-04: 4 mg via INTRAVENOUS
  Filled 2021-08-04: qty 1

## 2021-08-04 MED ORDER — ONDANSETRON HCL 4 MG/2ML IJ SOLN
4.0000 mg | Freq: Four times a day (QID) | INTRAMUSCULAR | Status: DC | PRN
  Administered 2021-08-05 – 2021-08-07 (×4): 4 mg via INTRAVENOUS
  Filled 2021-08-04 (×5): qty 2

## 2021-08-04 MED ORDER — METHYLPREDNISOLONE SODIUM SUCC 40 MG IJ SOLR
40.0000 mg | Freq: Two times a day (BID) | INTRAMUSCULAR | Status: DC
  Administered 2021-08-04 – 2021-08-08 (×8): 40 mg via INTRAVENOUS
  Filled 2021-08-04 (×8): qty 1

## 2021-08-04 MED ORDER — ONDANSETRON HCL 4 MG PO TABS: 4.0000 mg | ORAL_TABLET | Freq: Four times a day (QID) | ORAL | Status: DC | PRN

## 2021-08-04 MED ORDER — HYDRALAZINE HCL 20 MG/ML IJ SOLN: 10.0000 mg | Freq: Four times a day (QID) | INTRAMUSCULAR | Status: DC | PRN

## 2021-08-04 NOTE — ED Notes (Signed)
Pt c/o nausea and pain, provider notified

## 2021-08-04 NOTE — ED Provider Notes (Signed)
Emergency Medicine Provider Triage Evaluation Note  Frances Mccormick , a 35 y.o. female  was evaluated in triage.  Pt complains of abdominal pain since three am.  History of Crohn's, but this feels a little different.  She has had diarrhea about three times and vomited about 10 times.  Her abdomen is in upper abdomen.  With a flair she doesn't normally have diarrhea with a Crohn's flair.  Review of Systems  Positive: Abdominal pain, vomiting, diarrhea Negative: Fevers. Blood in stool.   Physical Exam  BP (!) 154/126   Pulse 83   Temp 99.9 F (37.7 C) (Oral)   Resp 18   LMP 08/01/2021   SpO2 100%  Gen:   Awake, no distress   Resp:  Normal effort  MSK:   Moves extremities without difficulty  Other:  Appears uncomfortable, vomit bag with bile.  Medical Decision Making  Medically screening exam initiated at 11:22 AM.  Appropriate orders placed.  FERNANDA TWADDELL was informed that the remainder of the evaluation will be completed by another provider, this initial triage assessment does not replace that evaluation, and the importance of remaining in the ED until their evaluation is complete.  Note: Portions of this report may have been transcribed using voice recognition software. Every effort was made to ensure accuracy; however, inadvertent computerized transcription errors may be present    Lorin Glass, PA-C 08/04/21 [place], [place] A, DO 08/04/21 1820

## 2021-08-04 NOTE — ED Notes (Signed)
IV site burning after contrast admin in CT. Line flushed, but unhooked from fluids for comfort at this time

## 2021-08-04 NOTE — ED Notes (Signed)
2 unsuccessful sticks for blood work. Phlebotomy contacted for stick.

## 2021-08-04 NOTE — ED Provider Notes (Signed)
Care assumed from [facility] at shift change, please see her note for full details, but in brief Frances Mccormick is a 35 y.o. female with history of Crohn's disease who presents for generalized abdominal pain, vomiting and diarrhea.  Symptoms started yesterday and have been persistent, she has been unable to keep anything down.  Is not currently on any chronic medications to manage her Crohn's disease but is followed by Dr. Benson Norway.  No blood in stool or emesis.  Lab work and CT are pending at shift change, on arrival patient with mildly elevated temp of 99.9, hypertensive but vitals otherwise normal.  Physical Exam  BP 122/80   Pulse (!) 59   Temp 99.9 F (37.7 C) (Oral)   Resp 18   LMP 08/01/2021   SpO2 100%   Physical Exam Vitals and nursing note reviewed.  Constitutional:      General: She is not in acute distress.    Appearance: Normal appearance. She is well-developed. She is not ill-appearing or diaphoretic.     Comments: Nauseated and uncomfortable appearing on exam but in no acute distress  HENT:     Head: Normocephalic and atraumatic.  Eyes:     General:        Right eye: No discharge.        Left eye: No discharge.  Pulmonary:     Effort: Pulmonary effort is normal. No respiratory distress.  Abdominal:     Tenderness: There is abdominal tenderness in the epigastric area and periumbilical area.     Comments: Abdomen is soft with mild generalized tenderness throughout, most notable in the epigastric and periumbilical regions.  Neurological:     Mental Status: She is alert and oriented to person, place, and time.     Coordination: Coordination normal.  Psychiatric:        Mood and Affect: Mood normal.        Behavior: Behavior normal.    ED Course/Procedures   Labs Reviewed  COMPREHENSIVE METABOLIC PANEL - Abnormal; Notable for the following components:      Result Value   Potassium 3.2 (*)    Glucose, Bld 108 (*)    Calcium 8.6 (*)    Total Protein 8.6 (*)     All other components within normal limits  URINALYSIS, ROUTINE W REFLEX MICROSCOPIC - Abnormal; Notable for the following components:   Color, Urine YELLOW (*)    APPearance CLEAR (*)    Hgb urine dipstick LARGE (*)    Ketones, ur >80 (*)    Protein, ur 100 (*)    Bacteria, UA RARE (*)    All other components within normal limits  CBC WITH DIFFERENTIAL/PLATELET - Abnormal; Notable for the following components:   WBC 15.7 (*)    RBC 5.52 (*)    MCV 76.4 (*)    MCH 25.4 (*)    RDW 16.5 (*)    Neutro Abs 14.3 (*)    All other components within normal limits  RESP PANEL BY RT-PCR (FLU A&B, COVID) ARPGX2  LIPASE, BLOOD  PREGNANCY, URINE  I-STAT BETA HCG BLOOD, ED ([facility], [facility], [facility] ONLY)   CT Abdomen Pelvis W Contrast  Result Date: 08/04/2021 CLINICAL DATA:  Generalized abdominal pain and vomiting since this morning. History of Crohn's. EXAM: CT ABDOMEN AND PELVIS WITH CONTRAST TECHNIQUE: Multidetector CT imaging of the abdomen and pelvis was performed using the standard protocol following bolus administration of intravenous contrast. CONTRAST:  41m OMNIPAQUE IOHEXOL 350 MG/ML SOLN  COMPARISON:  June 09, 2021 FINDINGS: Lower chest: No acute abnormality. Hepatobiliary: No suspicious hepatic lesion. Gallbladder is unremarkable. No biliary ductal dilation. Pancreas: No pancreatic ductal dilatation or surrounding inflammatory changes. Spleen: Within normal limits. Adrenals/Urinary Tract: Adrenal glands are unremarkable. Kidneys are normal, without renal calculi, solid enhancing lesion, or hydronephrosis. Bladder is unremarkable. Stomach/Bowel: Known tear contrast administered. Stomach is unremarkable for degree of distension. No pathologic dilation of small or large bowel. The appendix is unremarkable. Fatty infiltration of the terminal ileum and ascending/transverse colon wall. Short segment of colonic wall thickening involving the proximal transverse colon on image 32/2. No pneumatosis. Vascular/Lymphatic:  No pathologically enlarged abdominal or pelvic lymph nodes. No significant vascular findings. Reproductive: Uterus and bilateral adnexa are unremarkable. Other: No significant abdominopelvic free fluid. No pneumoperitoneum. Musculoskeletal: No acute or significant osseous findings. IMPRESSION: 1. No evidence of appendicitis, bowel obstruction or perforation. 2. Short segment of colonic wall thickening involving the proximal transverse colon. Multifocal fatty infiltration of the terminal ileum and colon, compatible with patient's given diagnosis of Crohn's. Constellation of findings which likely reflecting acute on chronic Crohn's disease. Additional areas of mild wall thickening of bowel are difficult to assess due to incomplete distension. No walled off fluid or pneumoperitoneum. Electronically Signed   By: Dahlia Bailiff M.D.   On: 08/04/2021 18:59     Procedures  MDM   Lab work suggest dehydration, mild hypokalemia noted, patient does have a white count of 15.7 likely in the setting of Crohn's flare, she is afebrile.  CT with short segment of colonic wall thickening involving the proximal transverse colon and multifocal fatty infiltration of the terminal ileum and colon compatible with patient's diagnosis of Crohn's, constellation of findings reflecting acute on chronic Crohn's disease.  Went in to reevaluate patient and pain is improved but she is continued to have nausea and vomiting despite multiple antiemetics.  Feel patient will need hospital admission for intractable vomiting as she will not be able to tolerate medications by mouth to help control her Crohn's flare.  Case discussed with Dr. Ardis Hughs with [facility] he recommends starting patient on 40 mg of Solu-Medrol twice daily and he will see patient in consult tomorrow.  Case discussed with Dr. Clearence Ped with [facility] who will see and admit the patient.       Jacqlyn Larsen, PA-C 08/04/21 2243    [place],  Gwenyth Allegra, MD 08/05/21 0010

## 2021-08-04 NOTE — ED Notes (Signed)
ED TO INPATIENT HANDOFF REPORT  Name/Age/Gender Frances Mccormick 35 y.o. female  Code Status Code Status History    Date Active Date Inactive Code Status Order ID Comments User Context   06/07/2021 1835 06/09/2021 1836 Full Code [id number]  Doran Heater, DO Inpatient   09/30/2018 2156 10/03/2018 2301 Full Code [id number]  Ivor Costa, MD ED   07/26/2018 2207 07/27/2018 2152 Full Code [id number]  Jani Gravel, MD ED   01/06/2015 1344 01/08/2015 2122 Full Code [id number]  Velvet Bathe, MD Inpatient   10/28/2014 1302 10/31/2014 1717 Full Code [id number]  Reyne Dumas, MD ED    Questions for Most Recent Historical Code Status (Order [id number])       Home/SNF/Other Home  Chief Complaint Acute Crohn's disease ([place]) [K50.90]  Level of Care/Admitting Diagnosis ED Disposition    ED Disposition  Admit   Condition  --   Comment  Hospital Area: [facility] [[id number]]  Level of Care: Med-Surg [16]  May admit patient to [facility] or [facility] if equivalent level of care is available:: Yes  Covid Evaluation: Asymptomatic Screening Protocol (No Symptoms)  Diagnosis: Acute Crohn's disease [facility]) [[id number]]  Admitting Physician: Rolla Plate [[id number]]  Attending Physician: Rolla Plate [[id number]]  Estimated length of stay: past midnight tomorrow  Certification:: I certify this patient will need inpatient services for at least 2 midnights         Medical History Past Medical History:  Diagnosis Date  . Crohn's colitis ([place])   . Migraine    "a few/year" (07/27/2018)  . Non-compliant patient     Allergies Allergies  Allergen Reactions  . Bee Venom Anaphylaxis  . Ciprofloxacin Anaphylaxis    IV Location/Drains/Wounds Patient Lines/Drains/Airways Status    Active Line/Drains/Airways    Name Placement date Placement time Site Days   Peripheral IV 08/04/21 22 G Left Antecubital 08/04/21  --  Antecubital  less than 1          Labs/Imaging Results  for orders placed or performed during the hospital encounter of 08/04/21 (from the past 48 hour(s))  Urinalysis, Routine w reflex microscopic Urine, Clean Catch     Status: Abnormal   Collection Time: 08/04/21 12:13 PM  Result Value Ref Range   Color, Urine YELLOW (A) YELLOW   APPearance CLEAR (A) CLEAR   Specific Gravity, Urine 1.020 1.005 - 1.030   pH 8.0 5.0 - 8.0   Glucose, UA NEGATIVE NEGATIVE mg/dL   Hgb urine dipstick LARGE (A) NEGATIVE   Bilirubin Urine NEGATIVE NEGATIVE   Ketones, ur >80 (A) NEGATIVE mg/dL   Protein, ur 100 (A) NEGATIVE mg/dL   Nitrite NEGATIVE NEGATIVE   Leukocytes,Ua NEGATIVE NEGATIVE   RBC / HPF 21-50 0 - 5 RBC/hpf   WBC, UA 0-5 0 - 5 WBC/hpf   Bacteria, UA RARE (A) NONE SEEN   Squamous Epithelial / LPF 0-5 0 - 5   Mucus PRESENT     Comment: Performed at [facility], [place] [address] [place], [place] [address]  Lipase, blood     Status: None   Collection Time: 08/04/21  2:48 PM  Result Value Ref Range   Lipase 29 11 - 51 U/L    Comment: Performed at [facility], [place] [address], [place], [place] [address]  Comprehensive metabolic panel     Status: Abnormal   Collection Time: 08/04/21  2:48 PM  Result Value Ref Range   Sodium 137 135 - 145 mmol/L  Potassium 3.2 (L) 3.5 - 5.1 mmol/L   Chloride 105 98 - 111 mmol/L   CO2 22 22 - 32 mmol/L   Glucose, Bld 108 (H) 70 - 99 mg/dL    Comment: Glucose reference range applies only to samples taken after fasting for at least 8 hours.   BUN 6 6 - 20 mg/dL   Creatinine, Ser 0.61 0.44 - 1.00 mg/dL   Calcium 8.6 (L) 8.9 - 10.3 mg/dL   Total Protein 8.6 (H) 6.5 - 8.1 g/dL   Albumin 4.3 3.5 - 5.0 g/dL   AST 20 15 - 41 U/L   ALT 14 0 - 44 U/L   Alkaline Phosphatase 63 38 - 126 U/L   Total Bilirubin 0.5 0.3 - 1.2 mg/dL   GFR, Estimated >60 >60 mL/min    Comment: (NOTE) Calculated using the CKD-EPI Creatinine Equation (2021)    Anion gap 10 5 - 15    Comment: Performed at  [facility], [place] [address], [place], [place] [address]  CBC with Differential     Status: Abnormal   Collection Time: 08/04/21  2:48 PM  Result Value Ref Range   WBC 15.7 (H) 4.0 - 10.5 K/uL   RBC 5.52 (H) 3.87 - 5.11 MIL/uL   Hemoglobin 14.0 12.0 - 15.0 g/dL   HCT 42.2 36.0 - 46.0 %   MCV 76.4 (L) 80.0 - 100.0 fL   MCH 25.4 (L) 26.0 - 34.0 pg   MCHC 33.2 30.0 - 36.0 g/dL   RDW 16.5 (H) 11.5 - 15.5 %   Platelets 364 150 - 400 K/uL   nRBC 0.0 0.0 - 0.2 %   Neutrophils Relative % 91 %   Neutro Abs 14.3 (H) 1.7 - 7.7 K/uL   Lymphocytes Relative 6 %   Lymphs Abs 0.9 0.7 - 4.0 K/uL   Monocytes Relative 2 %   Monocytes Absolute 0.3 0.1 - 1.0 K/uL   Eosinophils Relative 0 %   Eosinophils Absolute 0.0 0.0 - 0.5 K/uL   Basophils Relative 1 %   Basophils Absolute 0.1 0.0 - 0.1 K/uL   Immature Granulocytes 0 %   Abs Immature Granulocytes 0.06 0.00 - 0.07 K/uL    Comment: Performed at [facility], [place] [address] [place], [place] [address]  Pregnancy, urine     Status: None   Collection Time: 08/04/21  4:34 PM  Result Value Ref Range   Preg Test, Ur NEGATIVE NEGATIVE    Comment:        THE SENSITIVITY OF THIS METHODOLOGY IS >20 mIU/mL. Performed at [facility], [place] [address] [place], [place] [address]    CT Abdomen Pelvis W Contrast  Result Date: 08/04/2021 CLINICAL DATA:  Generalized abdominal pain and vomiting since this morning. History of Crohn's. EXAM: CT ABDOMEN AND PELVIS WITH CONTRAST TECHNIQUE: Multidetector CT imaging of the abdomen and pelvis was performed using the standard protocol following bolus administration of intravenous contrast. CONTRAST:  52m OMNIPAQUE IOHEXOL 350 MG/ML SOLN COMPARISON:  June 09, 2021 FINDINGS: Lower chest: No acute abnormality. Hepatobiliary: No suspicious hepatic lesion. Gallbladder is unremarkable. No biliary ductal dilation. Pancreas: No pancreatic ductal dilatation or surrounding  inflammatory changes. Spleen: Within normal limits. Adrenals/Urinary Tract: Adrenal glands are unremarkable. Kidneys are normal, without renal calculi, solid enhancing lesion, or hydronephrosis. Bladder is unremarkable. Stomach/Bowel: Known tear contrast administered. Stomach is unremarkable for degree of distension. No pathologic dilation of small or large bowel. The appendix is unremarkable. Fatty infiltration  of the terminal ileum and ascending/transverse colon wall. Short segment of colonic wall thickening involving the proximal transverse colon on image 32/2. No pneumatosis. Vascular/Lymphatic: No pathologically enlarged abdominal or pelvic lymph nodes. No significant vascular findings. Reproductive: Uterus and bilateral adnexa are unremarkable. Other: No significant abdominopelvic free fluid. No pneumoperitoneum. Musculoskeletal: No acute or significant osseous findings. IMPRESSION: 1. No evidence of appendicitis, bowel obstruction or perforation. 2. Short segment of colonic wall thickening involving the proximal transverse colon. Multifocal fatty infiltration of the terminal ileum and colon, compatible with patient's given diagnosis of Crohn's. Constellation of findings which likely reflecting acute on chronic Crohn's disease. Additional areas of mild wall thickening of bowel are difficult to assess due to incomplete distension. No walled off fluid or pneumoperitoneum. Electronically Signed   By: Dahlia Bailiff M.D.   On: 08/04/2021 18:59    Pending Labs Unresulted Labs (From admission, onward)    Start     Ordered   08/04/21 2133  Resp Panel by RT-PCR (Flu A&B, Covid) Nasopharyngeal Swab  (Tier 2 - Symptomatic/asymptomatic with Precautions )  Once,   STAT       Question Answer Comment  Is this test for diagnosis or screening Screening   Symptomatic for COVID-19 as defined by CDC No   Hospitalized for COVID-19 No   Admitted to ICU for COVID-19 No   Previously tested for COVID-19 Yes   Resident in  a congregate (group) care setting Unknown   Employed in healthcare setting Unknown   Pregnant Unknown   Has patient completed COVID vaccination(s) (2 doses of Pfizer/Moderna 1 dose of [employer]) Unknown      08/04/21 2132   Signed and Held  Comprehensive metabolic panel  Tomorrow morning,   R        Signed and Held   Signed and Held  CBC  Tomorrow morning,   R        Signed and Held   Signed and Held  Procalcitonin - Baseline  Tomorrow morning,   STAT        Signed and Held          Vitals/Pain Today's Vitals   08/04/21 2000 08/04/21 2056 08/04/21 2100 08/04/21 2200  BP: 117/67  (!) 170/117 122/80  Pulse: (!) 55  77 (!) 59  Resp: 18  20 18   Temp:      TempSrc:      SpO2: 100%  98% 100%  PainSc:  5       Isolation Precautions Airborne and Contact precautions  Medications Medications  methylPREDNISolone sodium succinate (SOLU-MEDROL) 40 mg/mL injection 40 mg (40 mg Intravenous Given 08/04/21 2135)  diazepam (VALIUM) injection 2.5 mg (has no administration in time range)  pantoprazole (PROTONIX) injection 40 mg (has no administration in time range)  sodium chloride 0.9 % bolus 1,000 mL (0 mLs Intravenous Stopped 08/04/21 1530)  ondansetron (ZOFRAN) injection 4 mg (4 mg Intravenous Given 08/04/21 1241)  morphine 4 MG/ML injection 4 mg (4 mg Intravenous Given 08/04/21 1241)  morphine 4 MG/ML injection 4 mg (4 mg Intravenous Given 08/04/21 1530)  ondansetron (ZOFRAN) injection 4 mg (4 mg Intravenous Given 08/04/21 1530)  metoCLOPramide (REGLAN) injection 10 mg (10 mg Intravenous Given 08/04/21 1713)  sodium chloride 0.9 % bolus 1,000 mL (0 mLs Intravenous Stopped 08/04/21 1855)  iohexol (OMNIPAQUE) 350 MG/ML injection 80 mL (80 mLs Intravenous Contrast Given 08/04/21 1809)  prochlorperazine (COMPAZINE) injection 10 mg (10 mg Intravenous Given 08/04/21 2137)    Mobility  walks

## 2021-08-04 NOTE — ED Provider Notes (Signed)
[facility] DEPT Provider Note   CSN: [id number] Arrival date & time: 08/04/21  1015     History Chief Complaint  Patient presents with   Abdominal Pain   Emesis    Frances Mccormick is a 35 y.o. female.   Abdominal Pain Associated symptoms: diarrhea, nausea and vomiting   Associated symptoms: no chest pain, no fever and no shortness of breath   Emesis Associated symptoms: abdominal pain and diarrhea   Associated symptoms: no fever    Patient presents with emesis, diarrhea, abdominal pain.  The started about 3 AM acutely this morning, she has had 3 episodes of diarrhea multiple episodes of emesis without hematemesis.  She does have a history of Crohn's disease, states this feels different because her Crohn's is not usually associated with diarrhea.  Denies any fevers at home, denies any recent sick contacts, nobody else at the home is sick, no recent new restaurants or travel.  She has tried Zofran without relief at home.  Patient states she is on her period, the abdominal pain feels like menstrual cramps in the lower region.  She is also having epigastric pain that does not radiate elsewhere.  Pain is constant, worse with pressure.  Past Medical History:  Diagnosis Date   Crohn's colitis ([place])    Migraine    "a few/year" (07/27/2018)   Non-compliant patient     Patient Active Problem List   Diagnosis Date Noted   Colitis 06/07/2021   Enterocolitis 06/07/2021   Hypertension 09/30/2018   Hypokalemia 09/30/2018   Leukocytosis 09/30/2018   Pilonidal abscess 09/30/2018   Intractable nausea and vomiting 09/30/2018   Chronic hypertension during pregnancy, antepartum 08/11/2018   Nausea & vomiting 07/26/2018   Medically noncompliant 10/26/2014   Crohn's disease ([place]) 10/26/2014    Past Surgical History:  Procedure Laterality Date   INDUCED ABORTION       OB History     Gravida  2   Para  0   Term      Preterm      AB  1   Living          SAB      IAB      Ectopic      Multiple      Live Births              Family History  Problem Relation Age of Onset   Diabetes Mother    Diabetes Father     Social History   Tobacco Use   Smoking status: Never   Smokeless tobacco: Never  Vaping Use   Vaping Use: Never used  Substance Use Topics   Alcohol use: Yes    Alcohol/week: 2.0 standard drinks    Types: 1 Glasses of wine, 1 Cans of beer per week   Drug use: Yes    Types: Marijuana    Comment: occaisonally     Home Medications Prior to Admission medications   Medication Sig Start Date End Date Taking? Authorizing Provider  acetaminophen (TYLENOL) 500 MG tablet Take 500 mg by mouth 2 (two) times daily as needed (pain).    [provider]  ondansetron (ZOFRAN ODT) 4 MG disintegrating tablet Take 1 tablet (4 mg total) by mouth every 8 (eight) hours as needed for nausea or vomiting. 06/09/21   Darliss Cheney, MD  famotidine (PEPCID) 20 MG tablet Take 1 tablet (20 mg total) by mouth 2 (two) times daily. Patient not taking: Reported on  03/02/2020 10/03/18 03/23/20  Debbe Odea, MD  metoCLOPramide (REGLAN) 10 MG tablet Take 1 tablet (10 mg total) by mouth every 8 (eight) hours as needed for nausea. Patient not taking: Reported on 03/02/2020 10/03/18 03/23/20  Debbe Odea, MD  potassium chloride SA (K-DUR,KLOR-CON) 20 MEQ tablet Take 2 tablets (40 mEq total) by mouth daily for 4 days. Patient not taking: Reported on 03/02/2020 10/03/18 03/23/20  Debbe Odea, MD  promethazine (PHENERGAN) 25 MG tablet Take 0.5-1 tablets (12.5-25 mg total) by mouth at bedtime as needed for nausea. Patient not taking: Reported on 03/02/2020 10/03/18 03/23/20  Debbe Odea, MD    Allergies    Bee venom and Ciprofloxacin  Review of Systems   Review of Systems  Constitutional:  Negative for fever.  Respiratory:  Negative for shortness of breath.   Cardiovascular:  Negative for chest pain and leg swelling.  Gastrointestinal:   Positive for abdominal pain, diarrhea, nausea and vomiting.  Musculoskeletal:  Negative for back pain.   Physical Exam Updated Vital Signs BP (!) 141/91 (BP Location: Right Arm)   Pulse 97   Temp 99.9 F (37.7 C) (Oral)   Resp 18   LMP 08/01/2021   SpO2 100%   Physical Exam Vitals and nursing note reviewed. Exam conducted with a chaperone present.  Constitutional:      Appearance: Normal appearance. She is obese.  HENT:     Head: Normocephalic and atraumatic.  Eyes:     General: No scleral icterus.       Right eye: No discharge.        Left eye: No discharge.     Extraocular Movements: Extraocular movements intact.     Pupils: Pupils are equal, round, and reactive to light.  Cardiovascular:     Rate and Rhythm: Normal rate and regular rhythm.     Pulses: Normal pulses.     Heart sounds: Normal heart sounds. No murmur heard.   No friction rub. No gallop.  Pulmonary:     Effort: Pulmonary effort is normal. No respiratory distress.     Breath sounds: Normal breath sounds.  Abdominal:     General: Abdomen is flat. Bowel sounds are normal. There is no distension.     Palpations: Abdomen is soft.     Tenderness: There is abdominal tenderness in the epigastric area. There is no right CVA tenderness, left CVA tenderness or guarding.     Comments: Abdomen is soft, tender to palpation to the epigastric area.  There is no guarding or rigidity, no CVA tenderness.  Skin:    General: Skin is warm and dry.     Coloration: Skin is not jaundiced.  Neurological:     Mental Status: She is alert. Mental status is at baseline.     Coordination: Coordination normal.    ED Results / Procedures / Treatments   Labs (all labs ordered are listed, but only abnormal results are displayed) Labs Reviewed  URINALYSIS, ROUTINE W REFLEX MICROSCOPIC - Abnormal; Notable for the following components:      Result Value   Color, Urine YELLOW (*)    APPearance CLEAR (*)    Hgb urine dipstick LARGE (*)     Ketones, ur >80 (*)    Protein, ur 100 (*)    Bacteria, UA RARE (*)    All other components within normal limits  LIPASE, BLOOD  COMPREHENSIVE METABOLIC PANEL  CBC WITH DIFFERENTIAL/PLATELET  I-STAT BETA HCG BLOOD, ED ([facility], [facility], [facility] ONLY)    EKG  None  Radiology No results found.  Procedures Procedures   Medications Ordered in ED Medications  sodium chloride 0.9 % bolus 1,000 mL (has no administration in time range)  ondansetron (ZOFRAN) injection 4 mg (has no administration in time range)  morphine 4 MG/ML injection 4 mg (has no administration in time range)    ED Course  I have reviewed the triage vital signs and the nursing notes.  Pertinent labs & imaging results that were available during my care of the patient were reviewed by me and considered in my medical decision making (see chart for details).    MDM Rules/Calculators/A&P                           Patient is hypertensive, otherwise vital stable.  She does have a temperature 99.9, suspect fever.  Epigastric tenderness, but no rigidity or guarding or peritoneal signs on exam.  Lower suspicion for surgical abdomen given that there are no peritoneal signs.  She is complaining of diarrhea and vomiting which increases likeliness of gastroenteritis.  We will also evaluate for UTI, intra-abdominal pathology. Ddx also includes pancreatitis, gastritis, gastroenteritis, Crohn's flare, GERD are also on the differential.  Patient on reevaluation does have pain control improvement, but still is having some pain.  We will give another dose of morphine given that with her weight that is more appropriate dose.  Suspect this is viral gastroenteritis, but this could be still a Crohn's flare as well as all the differential discussed above.  Patient CBC is resulted with leukocytosis of 15.7.  Given that she has history of Crohn's, will order CT abdomen to evaluate for possible abscess or intra-abdominal pathology.  Imaging will be  pending at the time of shift change, patient care signed out to Benedetto Goad who will follow.  Disposition following CT abdomen.  Final Clinical Impression(s) / ED Diagnoses Final diagnoses:  None    Rx / DC Orders ED Discharge Orders     None        Sherrill Raring, [place] 08/04/21 1501    Jeanell Sparrow, DO 08/04/21 1820

## 2021-08-04 NOTE — H&P (Addendum)
TRH H&P    Patient Demographics:    Frances Mccormick, is a 35 y.o. female  MRN: [id number]  DOB - [birth year]  Admit Date - 08/04/2021  Referring MD/NP/PA: [facility] MD for the patient is Armanda Heritage, NP  Patient coming from: Home  Chief complaint-nausea and vomiting   HPI:    Frances Mccormick  is a 36 y.o. female, with history of Crohn's colitis, migraine, and more presents the ED with a chief complaint of nausea and vomiting.  Patient reports that at 3 AM on September 4 she had multiple episodes of diarrhea.  They were nonbloody and mucousy.  At 6 AM she started vomiting.  She reports that she took Zofran and thought that would control the symptoms, but her nausea and vomiting continued all day.  She could not keep anything down.  Her last normal meal was the night before.  Initially her emesis appeared as undigested food, then yellow liquid, then green liquid.  She thinks she may have seen small streaks of blood in the emesis as well.  Patient's last normal bowel movement was on the third.  Patient admits to abdominal pain.  She reports that it is in her upper abdomen, and feels like muscle pain from having been vomiting all day.  She also has pelvic cramping but is on her menstrual period.  Patient denies any fevers.  She is not currently on any medications for her Crohn's, but she used to be on Lialda.  She has never had a surgery related to her Crohn's.  Patient has no other complaints at this time.  Patient does not smoke.  She uses alcohol about once every 2 weeks.  She smokes marijuana last use 2 days ago.  Patient is vaccinated for COVID.  Patient is full code.  In the ED Temp 99.9, heart rate 55-97, respiratory rate 16-20, blood pressure 170/117, satting at 98% Leukocytosis of 15.7, hemoglobin stable at 14.0 Chemistry panel reveals hypokalemia at 3.2 UA shows ketonuria Negative pregnancy  test CT abdomen shows no appendicitis, small bowel obstruction, or perforation.  It does show inflammatory changes consistent with Crohn's disease GI was consulted and recommended Solu-Medrol 40 mg twice daily Reglan, 2 doses of morphine, 2 doses Zofran given in the ED 2 L of normal saline also given in the ED Admission requested for further management of Crohn's flare    Review of systems:    In addition to the HPI above,  No Fever-chills, No Headache, No changes with Vision or hearing, No problems swallowing food or Liquids, No Chest pain, Cough or Shortness of Breath,  admits to abdominal pain, nausea, vomiting, diarrhea no Blood in stool or Urine, No dysuria, No new skin rashes or bruises, No new joints pains-aches,  No new weakness, tingling, numbness in any extremity, No recent weight gain or loss, No polyuria, polydypsia or polyphagia, No significant Mental Stressors.  All other systems reviewed and are negative.    Past History of the following :    Past Medical History:  Diagnosis Date  Crohn's colitis ([place])    Migraine    "a few/year" (07/27/2018)   Non-compliant patient       Past Surgical History:  Procedure Laterality Date   INDUCED ABORTION        Social History:      Social History   Tobacco Use   Smoking status: Never   Smokeless tobacco: Never  Substance Use Topics   Alcohol use: Yes    Alcohol/week: 2.0 standard drinks    Types: 1 Glasses of wine, 1 Cans of beer per week       Family History :     Family History  Problem Relation Age of Onset   Diabetes Mother    Diabetes Father       Home Medications:   Prior to Admission medications   Medication Sig Start Date End Date Taking? Authorizing Provider  acetaminophen (TYLENOL) 500 MG tablet Take 500 mg by mouth 2 (two) times daily as needed (pain).    [provider]  ondansetron (ZOFRAN ODT) 4 MG disintegrating tablet Take 1 tablet (4 mg total) by mouth every 8  (eight) hours as needed for nausea or vomiting. 06/09/21   Darliss Cheney, MD  famotidine (PEPCID) 20 MG tablet Take 1 tablet (20 mg total) by mouth 2 (two) times daily. Patient not taking: Reported on 03/02/2020 10/03/18 03/23/20  Debbe Odea, MD  metoCLOPramide (REGLAN) 10 MG tablet Take 1 tablet (10 mg total) by mouth every 8 (eight) hours as needed for nausea. Patient not taking: Reported on 03/02/2020 10/03/18 03/23/20  Debbe Odea, MD  potassium chloride SA (K-DUR,KLOR-CON) 20 MEQ tablet Take 2 tablets (40 mEq total) by mouth daily for 4 days. Patient not taking: Reported on 03/02/2020 10/03/18 03/23/20  Debbe Odea, MD  promethazine (PHENERGAN) 25 MG tablet Take 0.5-1 tablets (12.5-25 mg total) by mouth at bedtime as needed for nausea. Patient not taking: Reported on 03/02/2020 10/03/18 03/23/20  Debbe Odea, MD     Allergies:     Allergies  Allergen Reactions   Bee Venom Anaphylaxis   Ciprofloxacin Anaphylaxis     Physical Exam:   Vitals  Blood pressure 122/80, pulse (!) 59, temperature 99.9 F (37.7 C), temperature source Oral, resp. rate 18, last menstrual period 08/01/2021, SpO2 100 %, unknown if currently breastfeeding.  1.  General: Patient lying supine in bed,  no acute distress   2. Psychiatric: Alert and oriented x 3, mood and behavior normal for situation, pleasant and cooperative with exam   3. Neurologic: Speech and language are normal, face is symmetric, moves all 4 extremities voluntarily, at baseline without acute deficits on limited exam   4. HEENMT:  Head is atraumatic, normocephalic, pupils reactive to light, neck is supple, trachea is midline, mucous membranes are moist   5. Respiratory : Lungs are clear to auscultation bilaterally without wheezing, rhonchi, rales, no cyanosis, no increase in work of breathing or accessory muscle use   6. Cardiovascular : Heart rate normal, rhythm is regular, no murmurs, rubs or gallops, no peripheral edema, peripheral  pulses palpated   7. Gastrointestinal:  Abdomen is soft, nondistended, mildly tender to palpation diffusely after pain medication, bowel sounds active, no masses or organomegaly palpated   8. Skin:  Skin is warm, dry and intact without rashes, acute lesions, or ulcers on limited exam   9.Musculoskeletal:  No acute deformities or trauma, no asymmetry in tone, no peripheral edema, peripheral pulses palpated, no tenderness to palpation in the extremities  Data Review:    CBC Recent Labs  Lab 08/04/21 1448  WBC 15.7*  HGB 14.0  HCT 42.2  PLT 364  MCV 76.4*  MCH 25.4*  MCHC 33.2  RDW 16.5*  LYMPHSABS 0.9  MONOABS 0.3  EOSABS 0.0  BASOSABS 0.1   ------------------------------------------------------------------------------------------------------------------  Results for orders placed or performed during the hospital encounter of 08/04/21 (from the past 48 hour(s))  Urinalysis, Routine w reflex microscopic Urine, Clean Catch     Status: Abnormal   Collection Time: 08/04/21 12:13 PM  Result Value Ref Range   Color, Urine YELLOW (A) YELLOW   APPearance CLEAR (A) CLEAR   Specific Gravity, Urine 1.020 1.005 - 1.030   pH 8.0 5.0 - 8.0   Glucose, UA NEGATIVE NEGATIVE mg/dL   Hgb urine dipstick LARGE (A) NEGATIVE   Bilirubin Urine NEGATIVE NEGATIVE   Ketones, ur >80 (A) NEGATIVE mg/dL   Protein, ur 100 (A) NEGATIVE mg/dL   Nitrite NEGATIVE NEGATIVE   Leukocytes,Ua NEGATIVE NEGATIVE   RBC / HPF 21-50 0 - 5 RBC/hpf   WBC, UA 0-5 0 - 5 WBC/hpf   Bacteria, UA RARE (A) NONE SEEN   Squamous Epithelial / LPF 0-5 0 - 5   Mucus PRESENT     Comment: Performed at [facility], [place] [address], [place], [place] [address]  Lipase, blood     Status: None   Collection Time: 08/04/21  2:48 PM  Result Value Ref Range   Lipase 29 11 - 51 U/L    Comment: Performed at [facility], [place] [address] [place], [place] [address]  Comprehensive metabolic  panel     Status: Abnormal   Collection Time: 08/04/21  2:48 PM  Result Value Ref Range   Sodium 137 135 - 145 mmol/L   Potassium 3.2 (L) 3.5 - 5.1 mmol/L   Chloride 105 98 - 111 mmol/L   CO2 22 22 - 32 mmol/L   Glucose, Bld 108 (H) 70 - 99 mg/dL    Comment: Glucose reference range applies only to samples taken after fasting for at least 8 hours.   BUN 6 6 - 20 mg/dL   Creatinine, Ser 0.61 0.44 - 1.00 mg/dL   Calcium 8.6 (L) 8.9 - 10.3 mg/dL   Total Protein 8.6 (H) 6.5 - 8.1 g/dL   Albumin 4.3 3.5 - 5.0 g/dL   AST 20 15 - 41 U/L   ALT 14 0 - 44 U/L   Alkaline Phosphatase 63 38 - 126 U/L   Total Bilirubin 0.5 0.3 - 1.2 mg/dL   GFR, Estimated >60 >60 mL/min    Comment: (NOTE) Calculated using the CKD-EPI Creatinine Equation (2021)    Anion gap 10 5 - 15    Comment: Performed at [facility], [place] [address] [place], [place] [address]  CBC with Differential     Status: Abnormal   Collection Time: 08/04/21  2:48 PM  Result Value Ref Range   WBC 15.7 (H) 4.0 - 10.5 K/uL   RBC 5.52 (H) 3.87 - 5.11 MIL/uL   Hemoglobin 14.0 12.0 - 15.0 g/dL   HCT 42.2 36.0 - 46.0 %   MCV 76.4 (L) 80.0 - 100.0 fL   MCH 25.4 (L) 26.0 - 34.0 pg   MCHC 33.2 30.0 - 36.0 g/dL   RDW 16.5 (H) 11.5 - 15.5 %   Platelets 364 150 - 400 K/uL   nRBC 0.0 0.0 - 0.2 %   Neutrophils Relative %  91 %   Neutro Abs 14.3 (H) 1.7 - 7.7 K/uL   Lymphocytes Relative 6 %   Lymphs Abs 0.9 0.7 - 4.0 K/uL   Monocytes Relative 2 %   Monocytes Absolute 0.3 0.1 - 1.0 K/uL   Eosinophils Relative 0 %   Eosinophils Absolute 0.0 0.0 - 0.5 K/uL   Basophils Relative 1 %   Basophils Absolute 0.1 0.0 - 0.1 K/uL   Immature Granulocytes 0 %   Abs Immature Granulocytes 0.06 0.00 - 0.07 K/uL    Comment: Performed at [facility], [place] [address], [place], [place] [address]  Pregnancy, urine     Status: None   Collection Time: 08/04/21  4:34 PM  Result Value Ref Range   Preg Test, Ur NEGATIVE  NEGATIVE    Comment:        THE SENSITIVITY OF THIS METHODOLOGY IS >20 mIU/mL. Performed at [facility], [place] [address], [place], [place] [address]     Chemistries  Recent Labs  Lab 08/04/21 1448  NA 137  K 3.2*  CL 105  CO2 22  GLUCOSE 108*  BUN 6  CREATININE 0.61  CALCIUM 8.6*  AST 20  ALT 14  ALKPHOS 63  BILITOT 0.5   ------------------------------------------------------------------------------------------------------------------  ------------------------------------------------------------------------------------------------------------------ GFR: CrCl cannot be calculated (Unknown ideal weight.). Liver Function Tests: Recent Labs  Lab 08/04/21 1448  AST 20  ALT 14  ALKPHOS 63  BILITOT 0.5  PROT 8.6*  ALBUMIN 4.3   Recent Labs  Lab 08/04/21 1448  LIPASE 29   No results for input(s): AMMONIA in the last 168 hours. Coagulation Profile: No results for input(s): INR, PROTIME in the last 168 hours. Cardiac Enzymes: No results for input(s): CKTOTAL, CKMB, CKMBINDEX, TROPONINI in the last 168 hours. BNP (last 3 results) No results for input(s): PROBNP in the last 8760 hours. HbA1C: No results for input(s): HGBA1C in the last 72 hours. CBG: No results for input(s): GLUCAP in the last 168 hours. Lipid Profile: No results for input(s): CHOL, HDL, LDLCALC, TRIG, CHOLHDL, LDLDIRECT in the last 72 hours. Thyroid Function Tests: No results for input(s): TSH, T4TOTAL, FREET4, T3FREE, THYROIDAB in the last 72 hours. Anemia Panel: No results for input(s): VITAMINB12, FOLATE, FERRITIN, TIBC, IRON, RETICCTPCT in the last 72 hours.  --------------------------------------------------------------------------------------------------------------- Urine analysis:    Component Value Date/Time   COLORURINE YELLOW (A) 08/04/2021 1213   APPEARANCEUR CLEAR (A) 08/04/2021 1213   LABSPEC 1.020 08/04/2021 1213   PHURINE 8.0 08/04/2021 1213   GLUCOSEU  NEGATIVE 08/04/2021 1213   HGBUR LARGE (A) 08/04/2021 1213   BILIRUBINUR NEGATIVE 08/04/2021 1213   KETONESUR >80 (A) 08/04/2021 1213   PROTEINUR 100 (A) 08/04/2021 1213   UROBILINOGEN 0.2 06/08/2015 0736   NITRITE NEGATIVE 08/04/2021 1213   LEUKOCYTESUR NEGATIVE 08/04/2021 1213      Imaging Results:    CT Abdomen Pelvis W Contrast  Result Date: 08/04/2021 CLINICAL DATA:  Generalized abdominal pain and vomiting since this morning. History of Crohn's. EXAM: CT ABDOMEN AND PELVIS WITH CONTRAST TECHNIQUE: Multidetector CT imaging of the abdomen and pelvis was performed using the standard protocol following bolus administration of intravenous contrast. CONTRAST:  81m OMNIPAQUE IOHEXOL 350 MG/ML SOLN COMPARISON:  June 09, 2021 FINDINGS: Lower chest: No acute abnormality. Hepatobiliary: No suspicious hepatic lesion. Gallbladder is unremarkable. No biliary ductal dilation. Pancreas: No pancreatic ductal dilatation or surrounding inflammatory changes. Spleen: Within normal limits. Adrenals/Urinary Tract: Adrenal glands are unremarkable. Kidneys are normal, without renal calculi, solid enhancing  lesion, or hydronephrosis. Bladder is unremarkable. Stomach/Bowel: Known tear contrast administered. Stomach is unremarkable for degree of distension. No pathologic dilation of small or large bowel. The appendix is unremarkable. Fatty infiltration of the terminal ileum and ascending/transverse colon wall. Short segment of colonic wall thickening involving the proximal transverse colon on image 32/2. No pneumatosis. Vascular/Lymphatic: No pathologically enlarged abdominal or pelvic lymph nodes. No significant vascular findings. Reproductive: Uterus and bilateral adnexa are unremarkable. Other: No significant abdominopelvic free fluid. No pneumoperitoneum. Musculoskeletal: No acute or significant osseous findings. IMPRESSION: 1. No evidence of appendicitis, bowel obstruction or perforation. 2. Short segment of colonic  wall thickening involving the proximal transverse colon. Multifocal fatty infiltration of the terminal ileum and colon, compatible with patient's given diagnosis of Crohn's. Constellation of findings which likely reflecting acute on chronic Crohn's disease. Additional areas of mild wall thickening of bowel are difficult to assess due to incomplete distension. No walled off fluid or pneumoperitoneum. Electronically Signed   By: Dahlia Bailiff M.D.   On: 08/04/2021 18:59      Assessment & Plan:    Active Problems:   Hypertension   Hypokalemia   Leukocytosis   Acute Crohn's disease ([place])   Acute Crohn's disease N.p.o., maintenance fluids Twice daily Solu-Medrol GI consult in a.m. Control nausea and vomiting Protonix twice daily for associated GERD pain Leukocytosis Likely secondary to above No fevers Procalcitonin ordered to assess for any superimposed bacterial infection Hypokalemia Replace, recheck 2/2 GI losses Hypertension Blood pressure as high as 170/117 No BP meds at home As needed hydralazine   DVT Prophylaxis-   Heparin- SCDs    AM Labs Ordered, also please review Full Orders  Family Communication: No family at bedside Code Status: Full  Admission status: Inpatient :The appropriate admission status for this patient is INPATIENT. Inpatient status is judged to be reasonable and necessary in order to provide the required intensity of service to ensure the patient's safety. The patient's presenting symptoms, physical exam findings, and initial radiographic and laboratory data in the context of their chronic comorbidities is felt to place them at high risk for further clinical deterioration. Furthermore, it is not anticipated that the patient will be medically stable for discharge from the hospital within 2 midnights of admission. The following factors support the admission status of inpatient.     The patient's presenting symptoms include abdominal pain, nausea,  vomiting. The worrisome physical exam findings include hypertension. The initial radiographic and laboratory data are worrisome because of Crohn's flare, leukocytosis. The chronic co-morbidities include Crohn's disease.       * I certify that at the point of admission it is my clinical judgment that the patient will require inpatient hospital care spanning beyond 2 midnights from the point of admission due to high intensity of service, high risk for further deterioration and high frequency of surveillance required.*  Time spent in minutes : [place]

## 2021-08-04 NOTE — ED Triage Notes (Signed)
Pt BIB EMS from home. Pt endorses vomiting 10x and generalized abdominal pain since 3am. Pt has hx of Crohns. Denies blood in stool.   156/70 HR 68 100% RA CBG 130  22G LAC 78m Zofran

## 2021-08-05 DIAGNOSIS — R112 Nausea with vomiting, unspecified: Secondary | ICD-10-CM

## 2021-08-05 LAB — COMPREHENSIVE METABOLIC PANEL
ALT: 12 U/L (ref 0–44)
AST: 18 U/L (ref 15–41)
Albumin: 3.9 g/dL (ref 3.5–5.0)
Alkaline Phosphatase: 60 U/L (ref 38–126)
Anion gap: 9 (ref 5–15)
BUN: 9 mg/dL (ref 6–20)
CO2: 22 mmol/L (ref 22–32)
Calcium: 8.9 mg/dL (ref 8.9–10.3)
Chloride: 106 mmol/L (ref 98–111)
Creatinine, Ser: 0.74 mg/dL (ref 0.44–1.00)
GFR, Estimated: >60 mL/min (ref >60)
Glucose, Bld: 126 mg/dL — ABNORMAL HIGH (ref 70–99)
Potassium: 3.4 mmol/L — ABNORMAL LOW (ref 3.5–5.1)
Sodium: 137 mmol/L (ref 135–145)
Total Bilirubin: 0.7 mg/dL (ref 0.3–1.2)
Total Protein: 8.1 g/dL (ref 6.5–8.1)

## 2021-08-05 LAB — CBC
HCT: 41.3 % (ref 36.0–46.0)
Hemoglobin: 13.6 g/dL (ref 12.0–15.0)
MCH: 25.1 pg — ABNORMAL LOW (ref 26.0–34.0)
MCHC: 32.9 g/dL (ref 30.0–36.0)
MCV: 76.2 fL — ABNORMAL LOW (ref 80.0–100.0)
Platelets: 376 10*3/uL (ref 150–400)
RBC: 5.42 MIL/uL — ABNORMAL HIGH (ref 3.87–5.11)
RDW: 16.6 % — ABNORMAL HIGH (ref 11.5–15.5)
WBC: 13.6 10*3/uL — ABNORMAL HIGH (ref 4.0–10.5)
nRBC: 0 % (ref 0.0–0.2)

## 2021-08-05 LAB — RESP PANEL BY RT-PCR (FLU A&B, COVID) ARPGX2
Influenza A by PCR: NEGATIVE
Influenza B by PCR: NEGATIVE
SARS Coronavirus 2 by RT PCR: NEGATIVE

## 2021-08-05 LAB — PROCALCITONIN: Procalcitonin: <0.1 ng/mL

## 2021-08-05 MED ORDER — SODIUM CHLORIDE 0.9 % IV SOLN
6.2500 mg | Freq: Once | INTRAVENOUS | Status: AC
  Administered 2021-08-05: 6.25 mg via INTRAVENOUS
  Filled 2021-08-05 (×3): qty 0.25

## 2021-08-05 MED ORDER — POTASSIUM CHLORIDE 10 MEQ/100ML IV SOLN
10.0000 meq | Freq: Once | INTRAVENOUS | Status: AC
  Administered 2021-08-05: 10 meq via INTRAVENOUS
  Filled 2021-08-05: qty 100

## 2021-08-05 MED ORDER — POTASSIUM CHLORIDE 10 MEQ/100ML IV SOLN
10.0000 meq | INTRAVENOUS | Status: AC
  Administered 2021-08-05 (×3): 10 meq via INTRAVENOUS
  Filled 2021-08-05 (×3): qty 100

## 2021-08-05 MED ORDER — LORAZEPAM 0.5 MG PO TABS
0.5000 mg | ORAL_TABLET | Freq: Once | ORAL | Status: AC | PRN
  Administered 2021-08-05: 0.5 mg via ORAL
  Filled 2021-08-05: qty 1

## 2021-08-05 MED ORDER — ONDANSETRON HCL 4 MG/2ML IJ SOLN
4.0000 mg | Freq: Once | INTRAMUSCULAR | Status: AC | PRN
  Administered 2021-08-05: 4 mg via INTRAVENOUS
  Filled 2021-08-05: qty 2

## 2021-08-05 MED ORDER — METOPROLOL TARTRATE 5 MG/5ML IV SOLN
2.5000 mg | Freq: Four times a day (QID) | INTRAVENOUS | Status: DC
  Administered 2021-08-05 – 2021-08-07 (×5): 2.5 mg via INTRAVENOUS
  Filled 2021-08-05 (×8): qty 5

## 2021-08-05 MED ORDER — AMLODIPINE BESYLATE 5 MG PO TABS
5.0000 mg | ORAL_TABLET | Freq: Every day | ORAL | Status: DC
Start: 2021-08-05 — End: 2021-08-05

## 2021-08-05 MED ORDER — POTASSIUM CHLORIDE CRYS ER 20 MEQ PO TBCR
40.0000 meq | EXTENDED_RELEASE_TABLET | Freq: Once | ORAL | Status: AC
  Administered 2021-08-05: 40 meq via ORAL
  Filled 2021-08-05: qty 2

## 2021-08-05 NOTE — Plan of Care (Signed)

## 2021-08-05 NOTE — Progress Notes (Signed)
PROGRESS NOTE  Frances Mccormick    DOB: Feb 22, 1986, 35 y.o.  ZOX:[id number]  PCP: Armanda Heritage, NP   Code Status: Full Code   DOA: 08/04/2021   LOS: 1  Brief Narrative of Current Hospitalization  Frances Mccormick is a 35 y.o. female with a PMH significant for HTN, hypokalemia, Crohn's dz. They presented from home to the ED on 08/04/2021 with possible Crohn's flare x1 days. In the ED, it was found that they had vomiting and NB diarrhea and evidence suggestive of typical crohns flare on abdominal CT. They were treated with methylprednisolone and pain control.  Patient was admitted to medicine service for further workup and management of Crohn's flare as outlined in detail below.  08/05/21 -feeling very anxious and nauseous.  Assessment & Plan  Active Problems:   Hypertension   Hypokalemia   Leukocytosis   Acute Crohn's disease ([place])  Mild to moderate Crohn's disease- this is unlike past episodes of crohns flare for patient given the vomiting associated with the abdominal discomfort and non-bloody diarrhea. She is not on current therapy. Has a current oral lesion and has had distal "small intestine" lesions in the past. Last endoscopic procedure was colonoscopy 2012. - consult GI  - continue prednisone (methylprednisolone until tolerating PO) - IV pain, anti-emetic and anxiety therapy PRN  Chronic HTN- untreated. Elevated on admission but has been normotensive overnight without treatment. Will continue to monitor and if returns to elevated levels, consider adding on scheduled antihypertensive - update: elevated. Scheduled metoprolol IV until can tolerate PO  Hypokalemia- K+ 3.4 today - 34mq today - repeat BMP am  DVT prophylaxis: enoxaparin (LOVENOX) injection 40 mg Start: 08/04/21 2330 SCDs Start: 08/04/21 2324   Diet:  Diet Orders (From admission, onward)     Start     Ordered   08/04/21 2324  Diet NPO time specified Except for: Sips with Meds  Diet effective now        Question:  Except for  Answer:  Sips with Meds   08/04/21 2323            Subjective 08/05/21    Pt reports pain is improved but has severe anxiety. Nausea but no vomiting today.  Disposition Plan & Communication  Status is: Inpatient  Remains inpatient appropriate because:Inpatient level of care appropriate due to severity of illness  Dispo: The patient is from: Home              Anticipated d/c is to: Home              Patient currently is not medically stable to d/c.   Difficult to place patient No  Family Communication: NA   Consults, Procedures, Significant Events  Consultants:  GI  Procedures/significant events:  None to date  Antimicrobials:  Anti-infectives (From admission, onward)    None        Objective   Vitals:   08/04/21 2100 08/04/21 2200 08/04/21 2354 08/05/21 0435  BP: (!) 170/117 122/80 112/75 111/63  Pulse: 77 (!) 59 86 62  Resp: 20 18 18 17   Temp:   99 F (37.2 C) 98.3 F (36.8 C)  TempSrc:   Oral Oral  SpO2: 98% 100% 100% 99%  Weight:   93 kg   Height:   5' 1"  (1.549 m)     Intake/Output Summary (Last 24 hours) at 08/05/2021 0741 Last data filed at 08/05/2021 0729 Gross per 24 hour  Intake 1111.69 ml  Output 0 ml  Net  1111.69 ml   Filed Weights   08/04/21 2354  Weight: 93 kg    Patient BMI: Body mass index is 38.73 kg/m.   Physical Exam: General: awake, alert, agitated and restless in bed HEENT: atraumatic, clear conjunctiva, anicteric sclera, moist mucus membranes, hearing grossly normal, mild ulcer seen on right buccal surface Respiratory: normal respiratory effort. Cardiovascular: normal S1/S2, quick capillary refill  Gastrointestinal: soft, tender to all quadrants without guarding or rebound, ND, no HSM felt, decreased bowel sounds. Nervous: A&O x4. no gross focal neurologic deficits, normal speech Extremities: moves all equally, no edema, normal tone Skin: dry, intact, normal temperature, normal color, No rashes,  lesions or ulcers Psychiatry: anxious mood, congruent affect, judgement and insight appear normal  Labs   I have personally reviewed following labs and imaging studies Admission on 08/04/2021  Component Date Value Ref Range Status   Lipase 08/04/2021 29  11 - 51 U/L Final   Sodium 08/04/2021 137  135 - 145 mmol/L Final   Potassium 08/04/2021 3.2 (A) 3.5 - 5.1 mmol/L Final   Chloride 08/04/2021 105  98 - 111 mmol/L Final   CO2 08/04/2021 22  22 - 32 mmol/L Final   Glucose, Bld 08/04/2021 108 (A) 70 - 99 mg/dL Final   BUN 08/04/2021 6  6 - 20 mg/dL Final   Creatinine, Ser 08/04/2021 0.61  0.44 - 1.00 mg/dL Final   Calcium 08/04/2021 8.6 (A) 8.9 - 10.3 mg/dL Final   Total Protein 08/04/2021 8.6 (A) 6.5 - 8.1 g/dL Final   Albumin 08/04/2021 4.3  3.5 - 5.0 g/dL Final   AST 08/04/2021 20  15 - 41 U/L Final   ALT 08/04/2021 14  0 - 44 U/L Final   Alkaline Phosphatase 08/04/2021 63  38 - 126 U/L Final   Total Bilirubin 08/04/2021 0.5  0.3 - 1.2 mg/dL Final   GFR, Estimated 08/04/2021 >60  >60 mL/min Final   Anion gap 08/04/2021 10  5 - 15 Final   Color, Urine 08/04/2021 YELLOW (A) YELLOW Final   APPearance 08/04/2021 CLEAR (A) CLEAR Final   Specific Gravity, Urine 08/04/2021 1.020  1.005 - 1.030 Final   pH 08/04/2021 8.0  5.0 - 8.0 Final   Glucose, UA 08/04/2021 NEGATIVE  NEGATIVE mg/dL Final   Hgb urine dipstick 08/04/2021 LARGE (A) NEGATIVE Final   Bilirubin Urine 08/04/2021 NEGATIVE  NEGATIVE Final   Ketones, ur 08/04/2021 >80 (A) NEGATIVE mg/dL Final   Protein, ur 08/04/2021 100 (A) NEGATIVE mg/dL Final   Nitrite 08/04/2021 NEGATIVE  NEGATIVE Final   Leukocytes,Ua 08/04/2021 NEGATIVE  NEGATIVE Final   RBC / HPF 08/04/2021 21-50  0 - 5 RBC/hpf Final   WBC, UA 08/04/2021 0-5  0 - 5 WBC/hpf Final   Bacteria, UA 08/04/2021 RARE (A) NONE SEEN Final   Squamous Epithelial / LPF 08/04/2021 0-5  0 - 5 Final   Mucus 08/04/2021 PRESENT   Final   WBC 08/04/2021 15.7 (A) 4.0 - 10.5 K/uL Final    RBC 08/04/2021 5.52 (A) 3.87 - 5.11 MIL/uL Final   Hemoglobin 08/04/2021 14.0  12.0 - 15.0 g/dL Final   HCT 08/04/2021 42.2  36.0 - 46.0 % Final   MCV 08/04/2021 76.4 (A) 80.0 - 100.0 fL Final   MCH 08/04/2021 25.4 (A) 26.0 - 34.0 pg Final   MCHC 08/04/2021 33.2  30.0 - 36.0 g/dL Final   RDW 08/04/2021 16.5 (A) 11.5 - 15.5 % Final   Platelets 08/04/2021 364  150 - 400 K/uL Final  nRBC 08/04/2021 0.0  0.0 - 0.2 % Final   Neutrophils Relative % 08/04/2021 91  % Final   Neutro Abs 08/04/2021 14.3 (A) 1.7 - 7.7 K/uL Final   Lymphocytes Relative 08/04/2021 6  % Final   Lymphs Abs 08/04/2021 0.9  0.7 - 4.0 K/uL Final   Monocytes Relative 08/04/2021 2  % Final   Monocytes Absolute 08/04/2021 0.3  0.1 - 1.0 K/uL Final   Eosinophils Relative 08/04/2021 0  % Final   Eosinophils Absolute 08/04/2021 0.0  0.0 - 0.5 K/uL Final   Basophils Relative 08/04/2021 1  % Final   Basophils Absolute 08/04/2021 0.1  0.0 - 0.1 K/uL Final   Immature Granulocytes 08/04/2021 0  % Final   Abs Immature Granulocytes 08/04/2021 0.06  0.00 - 0.07 K/uL Final   Preg Test, Ur 08/04/2021 NEGATIVE  NEGATIVE Final   SARS Coronavirus 2 by RT PCR 08/04/2021 NEGATIVE  NEGATIVE Final   Influenza A by PCR 08/04/2021 NEGATIVE  NEGATIVE Final   Influenza B by PCR 08/04/2021 NEGATIVE  NEGATIVE Final   Sodium 08/05/2021 137  135 - 145 mmol/L Final   Potassium 08/05/2021 3.4 (A) 3.5 - 5.1 mmol/L Final   Chloride 08/05/2021 106  98 - 111 mmol/L Final   CO2 08/05/2021 22  22 - 32 mmol/L Final   Glucose, Bld 08/05/2021 126 (A) 70 - 99 mg/dL Final   BUN 08/05/2021 9  6 - 20 mg/dL Final   Creatinine, Ser 08/05/2021 0.74  0.44 - 1.00 mg/dL Final   Calcium 08/05/2021 8.9  8.9 - 10.3 mg/dL Final   Total Protein 08/05/2021 8.1  6.5 - 8.1 g/dL Final   Albumin 08/05/2021 3.9  3.5 - 5.0 g/dL Final   AST 08/05/2021 18  15 - 41 U/L Final   ALT 08/05/2021 12  0 - 44 U/L Final   Alkaline Phosphatase 08/05/2021 60  38 - 126 U/L Final    Total Bilirubin 08/05/2021 0.7  0.3 - 1.2 mg/dL Final   GFR, Estimated 08/05/2021 >60  >60 mL/min Final   Anion gap 08/05/2021 9  5 - 15 Final   WBC 08/05/2021 13.6 (A) 4.0 - 10.5 K/uL Final   RBC 08/05/2021 5.42 (A) 3.87 - 5.11 MIL/uL Final   Hemoglobin 08/05/2021 13.6  12.0 - 15.0 g/dL Final   HCT 08/05/2021 41.3  36.0 - 46.0 % Final   MCV 08/05/2021 76.2 (A) 80.0 - 100.0 fL Final   MCH 08/05/2021 25.1 (A) 26.0 - 34.0 pg Final   MCHC 08/05/2021 32.9  30.0 - 36.0 g/dL Final   RDW 08/05/2021 16.6 (A) 11.5 - 15.5 % Final   Platelets 08/05/2021 376  150 - 400 K/uL Final   nRBC 08/05/2021 0.0  0.0 - 0.2 % Final   Procalcitonin 08/05/2021 <0.10  ng/mL Final     Recent Results (from the past 240 hour(s))  Resp Panel by RT-PCR (Flu A&B, Covid) Nasopharyngeal Swab     Status: None   Collection Time: 08/04/21  9:40 PM   Specimen: Nasopharyngeal Swab; Nasopharyngeal(NP) swabs in vial transport medium  Result Value Ref Range Status   SARS Coronavirus 2 by RT PCR NEGATIVE NEGATIVE Final    Comment: (NOTE) SARS-CoV-2 target nucleic acids are NOT DETECTED.  The SARS-CoV-2 RNA is generally detectable in upper respiratory specimens during the acute phase of infection. The lowest concentration of SARS-CoV-2 viral copies this assay can detect is 138 copies/mL. A negative result does not preclude SARS-Cov-2 infection and should not be used  as the sole basis for treatment or other patient management decisions. A negative result may occur with  improper specimen collection/handling, submission of specimen other than nasopharyngeal swab, presence of viral mutation(s) within the areas targeted by this assay, and inadequate number of viral copies(<138 copies/mL). A negative result must be combined with clinical observations, patient history, and epidemiological information. The expected result is Negative.  Fact Sheet for Patients:  EntrepreneurPulse.com.au  Fact Sheet for  Healthcare Providers:  IncredibleEmployment.be  This test is no t yet approved or cleared by the [place] FDA and  has been authorized for detection and/or diagnosis of SARS-CoV-2 by FDA under an Emergency Use Authorization (EUA). This EUA will remain  in effect (meaning this test can be used) for the duration of the COVID-19 declaration under Section 564(b)(1) of the Act, 21 U.S.C.section 360bbb-3(b)(1), unless the authorization is terminated  or revoked sooner.       Influenza A by PCR NEGATIVE NEGATIVE Final   Influenza B by PCR NEGATIVE NEGATIVE Final    Comment: (NOTE) The Xpert Xpress SARS-CoV-2/FLU/RSV plus assay is intended as an aid in the diagnosis of influenza from Nasopharyngeal swab specimens and should not be used as a sole basis for treatment. Nasal washings and aspirates are unacceptable for Xpert Xpress SARS-CoV-2/FLU/RSV testing.  Fact Sheet for Patients: EntrepreneurPulse.com.au  Fact Sheet for Healthcare Providers: IncredibleEmployment.be  This test is not yet approved or cleared by the [place] FDA and has been authorized for detection and/or diagnosis of SARS-CoV-2 by FDA under an Emergency Use Authorization (EUA). This EUA will remain in effect (meaning this test can be used) for the duration of the COVID-19 declaration under Section 564(b)(1) of the Act, 21 U.S.C. section 360bbb-3(b)(1), unless the authorization is terminated or revoked.  Performed at [facility], [place] [address], [place], [place] [address]      Imaging Studies  CT Abdomen Pelvis W Contrast  Result Date: 08/04/2021 CLINICAL DATA:  Generalized abdominal pain and vomiting since this morning. History of Crohn's. EXAM: CT ABDOMEN AND PELVIS WITH CONTRAST TECHNIQUE: Multidetector CT imaging of the abdomen and pelvis was performed using the standard protocol following bolus administration of intravenous  contrast. CONTRAST:  40m OMNIPAQUE IOHEXOL 350 MG/ML SOLN COMPARISON:  June 09, 2021 FINDINGS: Lower chest: No acute abnormality. Hepatobiliary: No suspicious hepatic lesion. Gallbladder is unremarkable. No biliary ductal dilation. Pancreas: No pancreatic ductal dilatation or surrounding inflammatory changes. Spleen: Within normal limits. Adrenals/Urinary Tract: Adrenal glands are unremarkable. Kidneys are normal, without renal calculi, solid enhancing lesion, or hydronephrosis. Bladder is unremarkable. Stomach/Bowel: Known tear contrast administered. Stomach is unremarkable for degree of distension. No pathologic dilation of small or large bowel. The appendix is unremarkable. Fatty infiltration of the terminal ileum and ascending/transverse colon wall. Short segment of colonic wall thickening involving the proximal transverse colon on image 32/2. No pneumatosis. Vascular/Lymphatic: No pathologically enlarged abdominal or pelvic lymph nodes. No significant vascular findings. Reproductive: Uterus and bilateral adnexa are unremarkable. Other: No significant abdominopelvic free fluid. No pneumoperitoneum. Musculoskeletal: No acute or significant osseous findings. IMPRESSION: 1. No evidence of appendicitis, bowel obstruction or perforation. 2. Short segment of colonic wall thickening involving the proximal transverse colon. Multifocal fatty infiltration of the terminal ileum and colon, compatible with patient's given diagnosis of Crohn's. Constellation of findings which likely reflecting acute on chronic Crohn's disease. Additional areas of mild wall thickening of bowel are difficult to assess due to incomplete distension. No walled off fluid or pneumoperitoneum. Electronically Signed  By: Dahlia Bailiff M.D.   On: 08/04/2021 18:59   Medications   Scheduled Meds:  enoxaparin (LOVENOX) injection  40 mg Subcutaneous Q24H   methylPREDNISolone (SOLU-MEDROL) injection  40 mg Intravenous Q12H   pantoprazole (PROTONIX)  IV  40 mg Intravenous Q12H   Continuous Infusions:  sodium chloride 125 mL/hr at 08/05/21 0729   potassium chloride 45 mL/hr at 08/05/21 0729     LOS: 1 day   Time spent: >35 min   Richarda Osmond, DO [facility] 08/05/2021, 7:41 AM   To contact the [facility] Attending or Consulting provider for this patient: Check the care team in [facility] for a) attending/consulting [place] provider listed and b) the [facility] team listed Log into [email or web address] and use [facility] universal password to access. If you do not have the password, please contact the hospital operator. Locate the [facility] provider you are looking for under [facility] and page to a number that you can be directly reached. If you still have difficulty reaching the provider, please page the [facility] (Director on Call) for the Hospitalists listed on amion for assistance.

## 2021-08-05 NOTE — Consult Note (Signed)
[facility] Consultation Note  Referring Provider: [facility] Primary Care Physician:  Armanda Heritage, NP Primary Gastroenterologist:  Dr. Benson Norway (?)  Reason for Consultation:  abdominal pain  HPI: Frances Mccormick is a 35 y.o. female whom we've been asked to see for abdominal pain, nausea, vomiting, loose stools.  Symptoms started couple days ago, diarrhea better but still nauseated.  No hematemesis or blood in stool.  Nauseated now but no vomiting.  History of Crohn's disease diagnosed many years ago, has been seeing Dr. Benson Norway, last appointment earlier this year, reports he has wanted her to repeat colonoscopy but she hasn't done so.  She had previously been on mesalamine and steroids, but is not on any Crohn's therapy at this time.   Past Medical History:  Diagnosis Date   Crohn's colitis ([place])    Migraine    "a few/year" (07/27/2018)   Non-compliant patient     Past Surgical History:  Procedure Laterality Date   INDUCED ABORTION      Prior to Admission medications   Medication Sig Start Date End Date Taking? Authorizing Provider  acetaminophen (TYLENOL) 500 MG tablet Take 500 mg by mouth 2 (two) times daily as needed (pain).   Yes [provider]  ondansetron (ZOFRAN ODT) 4 MG disintegrating tablet Take 1 tablet (4 mg total) by mouth every 8 (eight) hours as needed for nausea or vomiting. 06/09/21  Yes Pahwani, Einar Grad, MD  famotidine (PEPCID) 20 MG tablet Take 1 tablet (20 mg total) by mouth 2 (two) times daily. Patient not taking: Reported on 03/02/2020 10/03/18 03/23/20  Debbe Odea, MD  metoCLOPramide (REGLAN) 10 MG tablet Take 1 tablet (10 mg total) by mouth every 8 (eight) hours as needed for nausea. Patient not taking: Reported on 03/02/2020 10/03/18 03/23/20  Debbe Odea, MD  potassium chloride SA (K-DUR,KLOR-CON) 20 MEQ tablet Take 2 tablets (40 mEq total) by mouth daily for 4 days. Patient not taking: Reported on 03/02/2020 10/03/18 03/23/20  Debbe Odea,  MD  promethazine (PHENERGAN) 25 MG tablet Take 0.5-1 tablets (12.5-25 mg total) by mouth at bedtime as needed for nausea. Patient not taking: Reported on 03/02/2020 10/03/18 03/23/20  Debbe Odea, MD    Current Facility-Administered Medications  Medication Dose Route Frequency Provider Last Rate Last Admin   0.9 %  sodium chloride infusion   Intravenous Continuous Zierle-Ghosh, Asia B, DO 125 mL/hr at 08/05/21 1141 Infusion Verify at 08/05/21 1141   acetaminophen (TYLENOL) tablet 650 mg  650 mg Oral Q6H PRN Zierle-Ghosh, Asia B, DO       Or   acetaminophen (TYLENOL) suppository 650 mg  650 mg Rectal Q6H PRN Zierle-Ghosh, Asia B, DO       enoxaparin (LOVENOX) injection 40 mg  40 mg Subcutaneous Q24H Zierle-Ghosh, Asia B, DO   40 mg at 08/05/21 0009   methylPREDNISolone sodium succinate (SOLU-MEDROL) 40 mg/mL injection 40 mg  40 mg Intravenous Q12H Zierle-Ghosh, Asia B, DO   40 mg at 08/05/21 0925   metoprolol tartrate (LOPRESSOR) injection 2.5 mg  2.5 mg Intravenous Q6H Doristine Mango L, MD       morphine 2 MG/ML injection 2 mg  2 mg Intravenous Q2H PRN Zierle-Ghosh, Asia B, DO   2 mg at 08/05/21 0959   ondansetron (ZOFRAN) tablet 4 mg  4 mg Oral Q6H PRN Zierle-Ghosh, Asia B, DO       Or   ondansetron (ZOFRAN) injection 4 mg  4 mg Intravenous Q6H PRN Zierle-Ghosh, Asia B, DO  4 mg at 08/05/21 1140   oxyCODONE (Oxy IR/ROXICODONE) immediate release tablet 5 mg  5 mg Oral Q4H PRN Zierle-Ghosh, Asia B, DO   5 mg at 08/05/21 0345   pantoprazole (PROTONIX) injection 40 mg  40 mg Intravenous Q12H Zierle-Ghosh, Asia B, DO   40 mg at 08/05/21 0920    Allergies as of 08/04/2021 - Review Complete 08/04/2021  Allergen Reaction Noted   Bee venom Anaphylaxis 08/02/2012   Ciprofloxacin Anaphylaxis 04/09/2012    Family History  Problem Relation Age of Onset   Diabetes Mother    Diabetes Father     Social History   Socioeconomic History   Marital status: Single    Spouse name: Not on file    Number of children: Not on file   Years of education: Not on file   Highest education level: Not on file  Occupational History   Not on file  Tobacco Use   Smoking status: Never   Smokeless tobacco: Never  Vaping Use   Vaping Use: Never used  Substance and Sexual Activity   Alcohol use: Yes    Alcohol/week: 2.0 standard drinks    Types: 1 Glasses of wine, 1 Cans of beer per week   Drug use: Yes    Types: Marijuana    Comment: occaisonally    Sexual activity: Yes    Birth control/protection: Condom  Other Topics Concern   Not on file  Social History Narrative   Not on file   Social Determinants of Health   Financial Resource Strain: Not on file  Food Insecurity: Not on file  Transportation Needs: Not on file  Physical Activity: Not on file  Stress: Not on file  Social Connections: Not on file  Intimate Partner Violence: Not on file    Review of Systems: As per HPI, all others negative  Physical Exam: Vital signs in last 24 hours: Temp:  [98.2 F (36.8 C)-99 F (37.2 C)] 98.5 F (36.9 C) (09/05 1100) Pulse Rate:  [55-97] 75 (09/05 1100) Resp:  [15-20] 15 (09/05 1100) BP: (111-189)/(63-117) 189/105 (09/05 1100) SpO2:  [98 %-100 %] 100 % (09/05 1100) Weight:  [93 kg] 93 kg (09/04 2354) Last BM Date: 08/05/21 General:   Alert, overweight, Well-developed, well-nourished, pleasant and cooperative in NAD Head:  Normocephalic and atraumatic. Eyes:  Sclera clear, no icterus.   Conjunctiva pink. Ears:  Normal auditory acuity. Nose:  No deformity, discharge,  or lesions. Mouth:  No deformity or lesions.  Oropharynx pink & moist. Neck:  Supple; no masses or thyromegaly. Abdomen:  Soft, mild generalized tenderness, and nondistended. No masses, hepatosplenomegaly or hernias noted. Normal bowel sounds, without guarding, and without rebound.     Msk:  Symmetrical without gross deformities. Normal posture. Pulses:  Normal pulses noted. Extremities:  Without clubbing or  edema. Neurologic:  Alert and  oriented x4;  grossly normal neurologically. Skin:  Intact without significant lesions or rashes. Psych:  Alert and cooperative. Normal mood and affect.   Lab Results: Recent Labs    08/04/21 1448 08/05/21 0358  WBC 15.7* 13.6*  HGB 14.0 13.6  HCT 42.2 41.3  PLT 364 376   BMET Recent Labs    08/04/21 1448 08/05/21 0358  NA 137 137  K 3.2* 3.4*  CL 105 106  CO2 22 22  GLUCOSE 108* 126*  BUN 6 9  CREATININE 0.61 0.74  CALCIUM 8.6* 8.9   LFT Recent Labs    08/05/21 0358  PROT 8.1  ALBUMIN  3.9  AST 18  ALT 12  ALKPHOS 60  BILITOT 0.7   PT/INR No results for input(s): LABPROT, INR in the last 72 hours.  Studies/Results: CT Abdomen Pelvis W Contrast  Result Date: 08/04/2021 CLINICAL DATA:  Generalized abdominal pain and vomiting since this morning. History of Crohn's. EXAM: CT ABDOMEN AND PELVIS WITH CONTRAST TECHNIQUE: Multidetector CT imaging of the abdomen and pelvis was performed using the standard protocol following bolus administration of intravenous contrast. CONTRAST:  106m OMNIPAQUE IOHEXOL 350 MG/ML SOLN COMPARISON:  June 09, 2021 FINDINGS: Lower chest: No acute abnormality. Hepatobiliary: No suspicious hepatic lesion. Gallbladder is unremarkable. No biliary ductal dilation. Pancreas: No pancreatic ductal dilatation or surrounding inflammatory changes. Spleen: Within normal limits. Adrenals/Urinary Tract: Adrenal glands are unremarkable. Kidneys are normal, without renal calculi, solid enhancing lesion, or hydronephrosis. Bladder is unremarkable. Stomach/Bowel: Known tear contrast administered. Stomach is unremarkable for degree of distension. No pathologic dilation of small or large bowel. The appendix is unremarkable. Fatty infiltration of the terminal ileum and ascending/transverse colon wall. Short segment of colonic wall thickening involving the proximal transverse colon on image 32/2. No pneumatosis. Vascular/Lymphatic: No  pathologically enlarged abdominal or pelvic lymph nodes. No significant vascular findings. Reproductive: Uterus and bilateral adnexa are unremarkable. Other: No significant abdominopelvic free fluid. No pneumoperitoneum. Musculoskeletal: No acute or significant osseous findings. IMPRESSION: 1. No evidence of appendicitis, bowel obstruction or perforation. 2. Short segment of colonic wall thickening involving the proximal transverse colon. Multifocal fatty infiltration of the terminal ileum and colon, compatible with patient's given diagnosis of Crohn's. Constellation of findings which likely reflecting acute on chronic Crohn's disease. Additional areas of mild wall thickening of bowel are difficult to assess due to incomplete distension. No walled off fluid or pneumoperitoneum. Electronically Signed   By: JDahlia BailiffM.D.   On: 08/04/2021 18:59    Impression:   Abdominal pain. Nausea, vomiting. Diarrhea, improved. Abnormal CT abdomen. Crohn's disease.  Currently not on treatment.  Plan:   Clear liquid diet. Antiemetics. Follow supportively; if not improved over the next day or two, might have to repeat colonoscopy as inpatient. Not having diarrhea right now, thus no stool studies; but if diarrhea recurs, would check stool studies. Patient reports following Dr. HBenson Norwayas outpatient, as recently as early this year.  [facility] will contact his office tomorrow and see whether they will follow patient moving forward versus we can continue to follow.   LOS: 1 day   Megham Dwyer M  08/05/2021, 12:13 PM  Cell [phone] no answer or after 5 PM call [phone]

## 2021-08-06 LAB — BASIC METABOLIC PANEL
Anion gap: 6 (ref 5–15)
BUN: 10 mg/dL (ref 6–20)
CO2: 21 mmol/L — ABNORMAL LOW (ref 22–32)
Calcium: 8.7 mg/dL — ABNORMAL LOW (ref 8.9–10.3)
Chloride: 107 mmol/L (ref 98–111)
Creatinine, Ser: 0.77 mg/dL (ref 0.44–1.00)
GFR, Estimated: >60 mL/min (ref >60)
Glucose, Bld: 127 mg/dL — ABNORMAL HIGH (ref 70–99)
Potassium: 3.8 mmol/L (ref 3.5–5.1)
Sodium: 134 mmol/L — ABNORMAL LOW (ref 135–145)

## 2021-08-06 MED ORDER — SODIUM CHLORIDE 0.9 % IV SOLN
12.5000 mg | Freq: Four times a day (QID) | INTRAVENOUS | Status: DC | PRN
  Administered 2021-08-06 – 2021-08-07 (×3): 12.5 mg via INTRAVENOUS
  Filled 2021-08-06: qty 12.5
  Filled 2021-08-06: qty 0.5
  Filled 2021-08-06: qty 12.5
  Filled 2021-08-06: qty 0.5

## 2021-08-06 MED ORDER — HYDRALAZINE HCL 20 MG/ML IJ SOLN
10.0000 mg | Freq: Four times a day (QID) | INTRAMUSCULAR | Status: DC | PRN
  Administered 2021-08-06: 10 mg via INTRAVENOUS
  Filled 2021-08-06: qty 1

## 2021-08-06 NOTE — Progress Notes (Signed)
Patient is established with Dr. Benson Norway, and Dr. Benson Norway will resume care of this patient today.  [facility] will sign off. Please contact us if we can be of any further assistance during this hospital stay.

## 2021-08-06 NOTE — Progress Notes (Signed)
Subjective: Feeling better.  Nausea and abdominal pain have nearly resolved.  Objective: Vital signs in last 24 hours: Temp:  [97.6 F (36.4 C)-99.1 F (37.3 C)] 98 F (36.7 C) (09/06 0500) Pulse Rate:  [56-77] 59 (09/06 0500) Resp:  [15-16] 16 (09/06 0500) BP: (104-189)/(63-107) 111/64 (09/06 0500) SpO2:  [99 %-100 %] 99 % (09/06 0500) Last BM Date: 08/04/21  Intake/Output from previous day: 09/05 0701 - 09/06 0700 In: 3908.1 [P.O.:870; I.V.:2816.8; IV Piggyback:221.3] Out: 600 [Urine:550; Emesis/NG output:50] Intake/Output this shift: Total I/O In: 480 [P.O.:480] Out: 800 [Urine:800]  General appearance: alert and no distress GI: soft, non-tender; bowel sounds normal; no masses,  no organomegaly  Lab Results: Recent Labs    08/04/21 1448 08/05/21 0358  WBC 15.7* 13.6*  HGB 14.0 13.6  HCT 42.2 41.3  PLT 364 376   BMET Recent Labs    08/04/21 1448 08/05/21 0358 08/06/21 0355  NA 137 137 134*  K 3.2* 3.4* 3.8  CL 105 106 107  CO2 22 22 21*  GLUCOSE 108* 126* 127*  BUN 6 9 10   CREATININE 0.61 0.74 0.77  CALCIUM 8.6* 8.9 8.7*   LFT Recent Labs    08/05/21 0358  PROT 8.1  ALBUMIN 3.9  AST 18  ALT 12  ALKPHOS 60  BILITOT 0.7   PT/INR No results for input(s): LABPROT, INR in the last 72 hours. Hepatitis Panel No results for input(s): HEPBSAG, HCVAB, HEPAIGM, HEPBIGM in the last 72 hours. C-Diff No results for input(s): CDIFFTOX in the last 72 hours. Fecal Lactopherrin No results for input(s): FECLLACTOFRN in the last 72 hours.  Studies/Results: CT Abdomen Pelvis W Contrast  Result Date: 08/04/2021 CLINICAL DATA:  Generalized abdominal pain and vomiting since this morning. History of Crohn's. EXAM: CT ABDOMEN AND PELVIS WITH CONTRAST TECHNIQUE: Multidetector CT imaging of the abdomen and pelvis was performed using the standard protocol following bolus administration of intravenous contrast. CONTRAST:  71m OMNIPAQUE IOHEXOL 350 MG/ML SOLN COMPARISON:   June 09, 2021 FINDINGS: Lower chest: No acute abnormality. Hepatobiliary: No suspicious hepatic lesion. Gallbladder is unremarkable. No biliary ductal dilation. Pancreas: No pancreatic ductal dilatation or surrounding inflammatory changes. Spleen: Within normal limits. Adrenals/Urinary Tract: Adrenal glands are unremarkable. Kidneys are normal, without renal calculi, solid enhancing lesion, or hydronephrosis. Bladder is unremarkable. Stomach/Bowel: Known tear contrast administered. Stomach is unremarkable for degree of distension. No pathologic dilation of small or large bowel. The appendix is unremarkable. Fatty infiltration of the terminal ileum and ascending/transverse colon wall. Short segment of colonic wall thickening involving the proximal transverse colon on image 32/2. No pneumatosis. Vascular/Lymphatic: No pathologically enlarged abdominal or pelvic lymph nodes. No significant vascular findings. Reproductive: Uterus and bilateral adnexa are unremarkable. Other: No significant abdominopelvic free fluid. No pneumoperitoneum. Musculoskeletal: No acute or significant osseous findings. IMPRESSION: 1. No evidence of appendicitis, bowel obstruction or perforation. 2. Short segment of colonic wall thickening involving the proximal transverse colon. Multifocal fatty infiltration of the terminal ileum and colon, compatible with patient's given diagnosis of Crohn's. Constellation of findings which likely reflecting acute on chronic Crohn's disease. Additional areas of mild wall thickening of bowel are difficult to assess due to incomplete distension. No walled off fluid or pneumoperitoneum. Electronically Signed   By: JDahlia BailiffM.D.   On: 08/04/2021 18:59    Medications: Scheduled:  enoxaparin (LOVENOX) injection  40 mg Subcutaneous Q24H   methylPREDNISolone (SOLU-MEDROL) injection  40 mg Intravenous Q12H   metoprolol tartrate  2.5 mg Intravenous Q6H  pantoprazole (PROTONIX) IV  40 mg Intravenous Q12H    Continuous:  sodium chloride 125 mL/hr at 08/06/21 0234    Assessment/Plan: 1) Crohn's disease. 2) Abdominal pain - resolved. 3) Diarrhea - resolved.   The patient is feeling much better.  She feels that the Solumedrol is helping to control her symptoms.  She was last evaluated in the office 03/2020 and the plan was to perform a colonoscopy, but cost was an issue.  At that time she also started a new job and she did not have the time off for the procedure.  Afterwards she forgot about the colonoscopy.  Her prior procedure was in 2012 and the biopsies were suggestive of Crohn's disease and she did respond to treatment.  For the majority of the time since her diagnosis she was not taking any medications.  Plan: 1) Continue with Solumedrol.  2) ? Colonoscopy during this admission.  It is preferable to have the procedure while she is in the hospital, but the current scheduling does not make it possible to perform the procedure in a timely manner.  This may need to be performed as an outpatient. 3) Advance to a regular diet.  She feels that she is ready to eat.  LOS: 2 days   Frances Mccormick D 08/06/2021, 10:49 AM

## 2021-08-06 NOTE — Progress Notes (Signed)
PROGRESS NOTE  MARGART ZEMANEK  DOB: Jan 16, 1986  PCP: Armanda Heritage, NP XIP:[id number]  DOA: 08/04/2021  LOS: 2 days  Hospital Day: 3   Chief Complaint  Patient presents with   Abdominal Pain   Emesis   Brief narrative: Frances Mccormick is a 35 y.o. female with PMH significant for HTN, hypokalemia, Crohn's disease. Patient presented to the ED from home on 08/04/2021 with complaint of nausea, vomiting, diarrhea. CT abdomen showed short segment of colonic wall thickening involving the proximal transverse colon.  Multifocal fatty infiltration of the terminal ileum and colon, compatible with patient's given diagnosis of Crohn's disease.  Constellation of findings reflecting acute on chronic Crohn's disease. Admitted to hospitalist service GI consultation was obtained.  Subjective: Patient was seen and examined this morning.  Pleasant young African-American female.  Walking on the hallway.  Feels better.  Abdominal pain improving.  No nausea or vomiting.  However later in the morning, patient had an episode of vomiting and started feeling anxious again  Assessment/Plan: Acute flareup of Crohn's disease -mild to moderate -Presented with nausea, vomiting, abdominal pain, nonbloody diarrhea -Not on chronic suppressive therapy for Crohn's disease at home.  Follows with GI Dr. Benson Norway as an outpatient and was recommended for colonoscopy in the past which she has not had done. -Currently on Solu-Medrol IV, IV pain meds.  Diet was advanced earlier in the morning to regular diet.  If continues to have vomiting, may need to scale back diet.  Essential hypertension -Blood pressure was elevated to 186/114 yesterday.  However its down to normal range today.  No diagnose history of hypertension and is not on any scheduled oral hypertensive.   -IV hydralazine as needed.  Continue to monitor.     Hypokalemia -Improved with replacement.  Continue to monitor Recent Labs  Lab 08/04/21 1448 08/05/21 0358  08/06/21 0355  K 3.2* 3.4* 3.8   Mobility: Encourage ambulation Code Status:   Code Status: Full Code  Nutritional status: Body mass index is 38.73 kg/m.     Diet:  Diet Order             Diet regular Room service appropriate? Yes; Fluid consistency: Thin  Diet effective now                  DVT prophylaxis:  enoxaparin (LOVENOX) injection 40 mg Start: 08/04/21 2330 SCDs Start: 08/04/21 2324   Antimicrobials: None Fluid: Currently saline at 125 mill per hour.  We will reduce to 75 mill per hour. Consultants: GI Family Communication: None at bedside  Status is: Inpatient  Remains inpatient appropriate because: Continues to have nausea, vomiting.  Continues to need IV steroids  Dispo: The patient is from: Home              Anticipated d/c is to: Home              Patient currently is not medically stable to d/c.   Difficult to place patient No     Infusions:   sodium chloride 125 mL/hr at 08/06/21 1216   promethazine (PHENERGAN) injection (IM or IVPB) 12.5 mg (08/06/21 1228)    Scheduled Meds:  enoxaparin (LOVENOX) injection  40 mg Subcutaneous Q24H   methylPREDNISolone (SOLU-MEDROL) injection  40 mg Intravenous Q12H   metoprolol tartrate  2.5 mg Intravenous Q6H    Antimicrobials: Anti-infectives (From admission, onward)    None       PRN meds: acetaminophen **OR** acetaminophen, hydrALAZINE, morphine injection, ondansetron **OR**  ondansetron (ZOFRAN) IV, oxyCODONE, promethazine (PHENERGAN) injection (IM or IVPB)   Objective: Vitals:   08/06/21 0135 08/06/21 0500  BP: 104/63 111/64  Pulse: (!) 59 (!) 59  Resp: 16 16  Temp: 98.3 F (36.8 C) 98 F (36.7 C)  SpO2: 100% 99%    Intake/Output Summary (Last 24 hours) at 08/06/2021 1315 Last data filed at 08/06/2021 1230 Gross per 24 hour  Intake 3420.39 ml  Output 1500 ml  Net 1920.39 ml   Filed Weights   08/04/21 2354  Weight: 93 kg   Weight change:  Body mass index is 38.73 kg/m.    Physical Exam: General exam: Pleasant, young African-American female.  Not in distress at the time of my evaluation Skin: No rashes, lesions or ulcers. HEENT: Atraumatic, normocephalic, no obvious bleeding Lungs: Clear to auscultation bilaterally CVS: Regular rate and rhythm, no murmur GI/Abd soft, nontender, nondistended, bowel sound present CNS: Alert, awake oriented x3 Psychiatry: Mood appropriate Extremities: No pedal edema, no calf tenderness  Data Review: I have personally reviewed the laboratory data and studies available.  Recent Labs  Lab 08/04/21 1448 08/05/21 0358  WBC 15.7* 13.6*  NEUTROABS 14.3*  --   HGB 14.0 13.6  HCT 42.2 41.3  MCV 76.4* 76.2*  PLT 364 376   Recent Labs  Lab 08/04/21 1448 08/05/21 0358 08/06/21 0355  NA 137 137 134*  K 3.2* 3.4* 3.8  CL 105 106 107  CO2 22 22 21*  GLUCOSE 108* 126* 127*  BUN 6 9 10   CREATININE 0.61 0.74 0.77  CALCIUM 8.6* 8.9 8.7*    F/u labs ordered Unresulted Labs (From admission, onward)     Start     Ordered   08/07/21 0500  CBC with Differential/Platelet  Tomorrow morning,   R        08/06/21 1315   08/07/21 0301  Basic metabolic panel  Tomorrow morning,   R        08/06/21 1315            Signed, Terrilee Croak, MD [facility] 08/06/2021

## 2021-08-07 LAB — CBC WITH DIFFERENTIAL/PLATELET
Abs Immature Granulocytes: 0.09 10*3/uL — ABNORMAL HIGH (ref 0.00–0.07)
Basophils Absolute: 0 10*3/uL (ref 0.0–0.1)
Basophils Relative: 0 %
Eosinophils Absolute: 0 10*3/uL (ref 0.0–0.5)
Eosinophils Relative: 0 %
HCT: 46.3 % — ABNORMAL HIGH (ref 36.0–46.0)
Hemoglobin: 15.5 g/dL — ABNORMAL HIGH (ref 12.0–15.0)
Immature Granulocytes: 1 %
Lymphocytes Relative: 9 %
Lymphs Abs: 1.4 10*3/uL (ref 0.7–4.0)
MCH: 25.2 pg — ABNORMAL LOW (ref 26.0–34.0)
MCHC: 33.5 g/dL (ref 30.0–36.0)
MCV: 75.3 fL — ABNORMAL LOW (ref 80.0–100.0)
Monocytes Absolute: 0.6 10*3/uL (ref 0.1–1.0)
Monocytes Relative: 4 %
Neutro Abs: 13.5 10*3/uL — ABNORMAL HIGH (ref 1.7–7.7)
Neutrophils Relative %: 86 %
Platelets: 363 10*3/uL (ref 150–400)
RBC: 6.15 MIL/uL — ABNORMAL HIGH (ref 3.87–5.11)
RDW: 17.6 % — ABNORMAL HIGH (ref 11.5–15.5)
WBC: 15.6 10*3/uL — ABNORMAL HIGH (ref 4.0–10.5)
nRBC: 0 % (ref 0.0–0.2)

## 2021-08-07 LAB — BASIC METABOLIC PANEL
Anion gap: 14 (ref 5–15)
BUN: 10 mg/dL (ref 6–20)
CO2: 21 mmol/L — ABNORMAL LOW (ref 22–32)
Calcium: 9.2 mg/dL (ref 8.9–10.3)
Chloride: 102 mmol/L (ref 98–111)
Creatinine, Ser: 0.74 mg/dL (ref 0.44–1.00)
GFR, Estimated: >60 mL/min (ref >60)
Glucose, Bld: 125 mg/dL — ABNORMAL HIGH (ref 70–99)
Potassium: 3 mmol/L — ABNORMAL LOW (ref 3.5–5.1)
Sodium: 137 mmol/L (ref 135–145)

## 2021-08-07 MED ORDER — POTASSIUM CHLORIDE CRYS ER 20 MEQ PO TBCR
40.0000 meq | EXTENDED_RELEASE_TABLET | Freq: Once | ORAL | Status: AC
  Administered 2021-08-07: 40 meq via ORAL
  Filled 2021-08-07: qty 2

## 2021-08-07 NOTE — Progress Notes (Signed)
Subjective: Frances Mccormick is a 35 year old black female with a history of hypertension hypokalemia and Crohn's disease who is being lost to follow-up due to lack of insurance.  Patient was admitted to the hospital at [facility] from on 08/04/2021 with nausea vomiting and diarrhea which is significantly improved with IV steroids. She had some nausea last night but that has improved today and she will be able to keep oatmeal and some lunch down. She denies having any abdominal pain.  She has had 1 mucoid BM today with no evidence of melena hematochezia. She denies having any joint pain skin rashes or ocular problems.  Objective: Vital signs in last 24 hours: Temp:  [98.4 F (36.9 C)-99.3 F (37.4 C)] 98.4 F (36.9 C) (09/07 0510) Pulse Rate:  [61-81] 63 (09/07 0510) Resp:  [15-16] 16 (09/07 0510) BP: (119-199)/(74-108) 127/80 (09/07 0510) SpO2:  [99 %-100 %] 99 % (09/07 0510) Last BM Date: 08/04/21  Intake/Output from previous day: 09/06 0701 - 09/07 0700 In: 3253.4 [P.O.:1280; I.V.:1873.4; IV Piggyback:100] Out: 2850 [Urine:2750; Emesis/NG output:100] Intake/Output this shift: Total I/O In: -  Out: 400 [Urine:400]  General appearance: alert, cooperative, appears stated age, no distress, and morbidly obese Resp: clear to auscultation bilaterally Cardio: regular rate and rhythm, S1, S2 normal, no murmur, click, rub or gallop GI: soft, non-tender; bowel sounds normal; no masses,  no organomegaly  Lab Results: Recent Labs    08/04/21 1448 08/05/21 0358 08/07/21 0424  WBC 15.7* 13.6* 15.6*  HGB 14.0 13.6 15.5*  HCT 42.2 41.3 46.3*  PLT 364 376 363   BMET Recent Labs    08/05/21 0358 08/06/21 0355 08/07/21 0424  NA 137 134* 137  K 3.4* 3.8 3.0*  CL 106 107 102  CO2 22 21* 21*  GLUCOSE 126* 127* 125*  BUN 9 10 10   CREATININE 0.74 0.77 0.74  CALCIUM 8.9 8.7* 9.2   LFT Recent Labs    08/05/21 0358  PROT 8.1  ALBUMIN 3.9  AST 18  ALT 12  ALKPHOS 60  BILITOT 0.7   Medications: I have reviewed the patient's current medications.  Assessment/Plan: 1) Abnormal CT scan with short segment of colonic wall thickening the proximal transverse colon multifocal fatty infiltration of the terminal ileum compatible with Crohn's disease/diarrhea/nausea and vomiting,-this seems to be improving with IV steroids.  Patient is tolerating her diet well I think she will be ready discharge tomorrow with plans for close follow-up with Dr. Benson Norway in the office within the next 7 to 10 days. 2) Hypertension-on IV hydralazine. 3) Hypokalemia-being replenished. 4) Morbid obesity. . LOS: 3 days   Juanita Craver 08/07/2021, 1:15 PM

## 2021-08-07 NOTE — Progress Notes (Signed)
PROGRESS NOTE  Frances Mccormick  DOB: [birth year]  PCP: Armanda Heritage, NP XNA:[id number]  DOA: 08/04/2021  LOS: 3 days  Hospital Day: 4   Chief Complaint  Patient presents with   Abdominal Pain   Emesis   Brief narrative: Frances Mccormick is a 35 y.o. female with PMH significant for HTN, hypokalemia, Crohn's disease. Patient presented to the ED from home on 08/04/2021 with complaint of nausea, vomiting, diarrhea. CT abdomen showed short segment of colonic wall thickening involving the proximal transverse colon.  Multifocal fatty infiltration of the terminal ileum and colon, compatible with patient's given diagnosis of Crohn's disease.  Constellation of findings reflecting acute on chronic Crohn's disease. Admitted to hospitalist service GI consultation was obtained.  Subjective: Patient was seen and examined this morning.  Propped up in bed.  Nauseous.  Tried broth last night but could not complete.  Has not been able to have a big appetite yet.  No bowel movement in the last 3 days prior to which she was having diarrhea.  While she was nauseous and anxious yesterday, blood pressure was elevated to over 190.  Assessment/Plan: Acute flareup of Crohn's disease -mild to moderate -Presented with nausea, vomiting, abdominal pain, nonbloody diarrhea -Not on chronic suppressive therapy for Crohn's disease at home.  Follows with GI Dr. Benson Norway as an outpatient and was recommended for colonoscopy in the past which she has not had done. -Currently on Solu-Medrol IV, IV pain meds.  Although she has been allowed to try regular diet, she has not been able to tolerate it because of persistent nausea.    Essential hypertension -No history of hypertension, not on blood pressure meds at home.  However with episodes of nausea and anxiety, blood pressure tends to run high in the hospital.   -IV hydralazine as needed.  Continue to monitor.     Hypokalemia -Potassium level low at 3 this morning.   Replacement ordered.  Repeat level tomorrow.  Continue to monitor Recent Labs  Lab 08/04/21 1448 08/05/21 0358 08/06/21 0355 08/07/21 0424  K 3.2* 3.4* 3.8 3.0*   Mobility: Encourage ambulation Code Status:   Code Status: Full Code  Nutritional status: Body mass index is 38.73 kg/m.     Diet:  Diet Order             Diet regular Room service appropriate? Yes; Fluid consistency: Thin  Diet effective now                  DVT prophylaxis:  enoxaparin (LOVENOX) injection 40 mg Start: 08/04/21 2330 SCDs Start: 08/04/21 2324   Antimicrobials: None Fluid: Continue normal saline at 75 mill per hour Consultants: GI Family Communication: None at bedside  Status is: Inpatient  Remains inpatient appropriate because: Continues to have nausea, vomiting.  Continues to need IV steroids  Dispo: The patient is from: Home              Anticipated d/c is to: Home              Patient currently is not medically stable to d/c.   Difficult to place patient No     Infusions:   sodium chloride 75 mL/hr at 08/06/21 1335   promethazine (PHENERGAN) injection (IM or IVPB) 12.5 mg (08/07/21 0609)    Scheduled Meds:  enoxaparin (LOVENOX) injection  40 mg Subcutaneous Q24H   methylPREDNISolone (SOLU-MEDROL) injection  40 mg Intravenous Q12H   metoprolol tartrate  2.5 mg Intravenous Q6H  Antimicrobials: Anti-infectives (From admission, onward)    None       PRN meds: acetaminophen **OR** acetaminophen, hydrALAZINE, morphine injection, ondansetron **OR** ondansetron (ZOFRAN) IV, oxyCODONE, promethazine (PHENERGAN) injection (IM or IVPB)   Objective: Vitals:   08/06/21 2134 08/07/21 0510  BP: 119/74 127/80  Pulse: 61 63  Resp: 16 16  Temp: 99.3 F (37.4 C) 98.4 F (36.9 C)  SpO2: 100% 99%    Intake/Output Summary (Last 24 hours) at 08/07/2021 1049 Last data filed at 08/07/2021 0810 Gross per 24 hour  Intake 2273.52 ml  Output 2450 ml  Net -176.48 ml   Filed  Weights   08/04/21 2354  Weight: 93 kg   Weight change:  Body mass index is 38.73 kg/m.   Physical Exam: General exam: Pleasant, young African-American female.  In distress because of persistent nausea  skin: No rashes, lesions or ulcers. HEENT: Atraumatic, normocephalic, no obvious bleeding Lungs: Clear to auscultation bilaterally CVS: Regular rate and rhythm, no murmur GI/Abd soft, tenderness in mid lower abdomen, nondistended, bowel sound present CNS: Alert, awake oriented x3 Psychiatry: Mood appropriate Extremities: No pedal edema, no calf tenderness  Data Review: I have personally reviewed the laboratory data and studies available.  Recent Labs  Lab 08/04/21 1448 08/05/21 0358 08/07/21 0424  WBC 15.7* 13.6* 15.6*  NEUTROABS 14.3*  --  13.5*  HGB 14.0 13.6 15.5*  HCT 42.2 41.3 46.3*  MCV 76.4* 76.2* 75.3*  PLT 364 376 363   Recent Labs  Lab 08/04/21 1448 08/05/21 0358 08/06/21 0355 08/07/21 0424  NA 137 137 134* 137  K 3.2* 3.4* 3.8 3.0*  CL 105 106 107 102  CO2 22 22 21* 21*  GLUCOSE 108* 126* 127* 125*  BUN 6 9 10 10   CREATININE 0.61 0.74 0.77 0.74  CALCIUM 8.6* 8.9 8.7* 9.2    F/u labs ordered Unresulted Labs (From admission, onward)     Start     Ordered   08/08/21 0500  CBC with Differential/Platelet  Daily,   R      08/07/21 1049   08/08/21 2536  Basic metabolic panel  Daily,   R      08/07/21 1049            Signed, Terrilee Croak, MD [facility] 08/07/2021

## 2021-08-08 LAB — CBC WITH DIFFERENTIAL/PLATELET
Abs Immature Granulocytes: 0.09 10*3/uL — ABNORMAL HIGH (ref 0.00–0.07)
Basophils Absolute: 0 10*3/uL (ref 0.0–0.1)
Basophils Relative: 0 %
Eosinophils Absolute: 0 10*3/uL (ref 0.0–0.5)
Eosinophils Relative: 0 %
HCT: 44.7 % (ref 36.0–46.0)
Hemoglobin: 14.9 g/dL (ref 12.0–15.0)
Immature Granulocytes: 1 %
Lymphocytes Relative: 9 %
Lymphs Abs: 1.3 10*3/uL (ref 0.7–4.0)
MCH: 25.3 pg — ABNORMAL LOW (ref 26.0–34.0)
MCHC: 33.3 g/dL (ref 30.0–36.0)
MCV: 75.9 fL — ABNORMAL LOW (ref 80.0–100.0)
Monocytes Absolute: 0.4 10*3/uL (ref 0.1–1.0)
Monocytes Relative: 3 %
Neutro Abs: 11.8 10*3/uL — ABNORMAL HIGH (ref 1.7–7.7)
Neutrophils Relative %: 87 %
Platelets: 347 10*3/uL (ref 150–400)
RBC: 5.89 MIL/uL — ABNORMAL HIGH (ref 3.87–5.11)
RDW: 16.9 % — ABNORMAL HIGH (ref 11.5–15.5)
WBC: 13.5 10*3/uL — ABNORMAL HIGH (ref 4.0–10.5)
nRBC: 0 % (ref 0.0–0.2)

## 2021-08-08 LAB — BASIC METABOLIC PANEL
Anion gap: 9 (ref 5–15)
BUN: 17 mg/dL (ref 6–20)
CO2: 23 mmol/L (ref 22–32)
Calcium: 9 mg/dL (ref 8.9–10.3)
Chloride: 108 mmol/L (ref 98–111)
Creatinine, Ser: 0.86 mg/dL (ref 0.44–1.00)
GFR, Estimated: >60 mL/min (ref >60)
Glucose, Bld: 125 mg/dL — ABNORMAL HIGH (ref 70–99)
Potassium: 3.6 mmol/L (ref 3.5–5.1)
Sodium: 140 mmol/L (ref 135–145)

## 2021-08-08 MED ORDER — PREDNISONE 10 MG PO TABS: 40.0000 mg | ORAL_TABLET | Freq: Every day | ORAL | 0 refills | Status: AC

## 2021-08-08 MED ORDER — PANTOPRAZOLE SODIUM 40 MG PO TBEC: 40.0000 mg | DELAYED_RELEASE_TABLET | Freq: Every day | ORAL | 0 refills | Status: DC

## 2021-08-08 MED ORDER — ONDANSETRON HCL 4 MG PO TABS: 4.0000 mg | ORAL_TABLET | Freq: Four times a day (QID) | ORAL | 0 refills | Status: AC | PRN

## 2021-08-08 NOTE — Progress Notes (Signed)
Reviewed written d/c instructions w pt and all questions answered. She verbalized understanding. D/C per w/c w all belongings in stable condition.

## 2021-08-08 NOTE — Discharge Summary (Signed)
Physician Discharge Summary  [name] WGN:[id number] DOB: 1986-03-02 DOA: 08/04/2021  PCP: Armanda Heritage, NP  Admit date: 08/04/2021 Discharge date: 08/08/2021  Admitted From: Home Discharge disposition: Home   Code Status: Full Code   Discharge Diagnosis:   Active Problems:   Hypertension   Hypokalemia   Leukocytosis   Exacerbation of Crohn's disease without complication [facility])   Chief Complaint  Patient presents with   Abdominal Pain   Emesis   Brief narrative: Frances Mccormick is a 35 y.o. female with PMH significant for HTN, hypokalemia, Crohn's disease. Patient presented to the ED from home on 08/04/2021 with complaint of nausea, vomiting, diarrhea. CT abdomen showed short segment of colonic wall thickening involving the proximal transverse colon.  Multifocal fatty infiltration of the terminal ileum and colon, compatible with patient's given diagnosis of Crohn's disease.  Constellation of findings reflecting acute on chronic Crohn's disease. Admitted to hospitalist service GI consultation was obtained.  Subjective: Patient was seen and examined this morning.  Sitting up in chair.  Taking her breakfast.  Tolerated dinner last night.  Able to tolerate breakfast this morning.  Abdominal pain improving.  Feels better enough to go home.  Hospital course: Acute flareup of Crohn's disease -mild to moderate -Presented with nausea, vomiting, abdominal pain, nonbloody diarrhea. -She was started on IV Solu-Medrol 40 mg twice daily as well as IV pain medicines.  Patient gradually improved in terms of her abdominal pain, nausea and vomiting.  She is able to tolerate regular diet now.  As per GI recommendation, will discharge her on a course of prednisone 40 mg daily for a month.  She will be seen by Dr. Benson Norway in the interval and will be planned for taper or a prolonged course as needed. -Not on chronic suppressive therapy for Crohn's disease at home.  Follows with GI Dr. Benson Norway as an  outpatient and was recommended for colonoscopy in the past which she has not had done. -Continue to follow-up.  Elevated blood pressure -No history of hypertension, not on blood pressure meds at home.  She had intermittent episodes of elevated blood pressure in the hospital probably due to anxiety and pain.  No need of scheduled antihypertensive at this time. -Continue to monitor as an outpatient.    Allergies as of 08/08/2021       Reactions   Bee Venom Anaphylaxis   Ciprofloxacin Anaphylaxis        Medication List     STOP taking these medications    ondansetron 4 MG disintegrating tablet Commonly known as: Zofran ODT       TAKE these medications    acetaminophen 500 MG tablet Commonly known as: TYLENOL Take 500 mg by mouth 2 (two) times daily as needed (pain).   ondansetron 4 MG tablet Commonly known as: ZOFRAN Take 1 tablet (4 mg total) by mouth every 6 (six) hours as needed for up to 5 days for nausea.   pantoprazole 40 MG tablet Commonly known as: Protonix Take 1 tablet (40 mg total) by mouth daily.   predniSONE 10 MG tablet Commonly known as: DELTASONE Take 4 tablets (40 mg total) by mouth daily with breakfast.        Discharge Instructions:  Diet Recommendation:  Discharge Diet Orders (From admission, onward)     Start     Ordered   08/08/21 0000  Diet general        08/08/21 1211  Follow with Primary MD Armanda Heritage, NP in 7 days   Get CBC/BMP checked in next visit within 1 week by PCP or SNF MD ( we routinely change or add medications that can affect your baseline labs and fluid status, therefore we recommend that you get the mentioned basic workup next visit with your PCP, your PCP may decide not to get them or add new tests based on their clinical decision)  On your next visit with your PCP, please Get Medicines reviewed and adjusted.  Please request your PCP  to go over all Hospital Tests and Procedure/Radiological  results at the follow up, please get all Hospital records sent to your Prim MD by signing hospital release before you go home.  Activity: As tolerated with Full fall precautions use walker/cane & assistance as needed  For Heart failure patients - Check your Weight same time everyday, if you gain over 2 pounds, or you develop in leg swelling, experience more shortness of breath or chest pain, call your Primary MD immediately. Follow Cardiac Low Salt Diet and 1.5 lit/day fluid restriction.  If you have smoked or chewed Tobacco in the last 2 yrs please stop smoking, stop any regular Alcohol  and or any Recreational drug use.  If you experience worsening of your admission symptoms, develop shortness of breath, life threatening emergency, suicidal or homicidal thoughts you must seek medical attention immediately by calling 911 or calling your MD immediately  if symptoms less severe.  You Must read complete instructions/literature along with all the possible adverse reactions/side effects for all the Medicines you take and that have been prescribed to you. Take any new Medicines after you have completely understood and accpet all the possible adverse reactions/side effects.   Do not drive, operate heavy machinery, perform activities at heights, swimming or participation in water activities or provide baby sitting services if your were admitted for syncope or siezures until you have seen by Primary MD or a Neurologist and advised to do so again.  Do not drive when taking Pain medications.  Do not take more than prescribed Pain, Sleep and Anxiety Medications  Wear Seat belts while driving.   Please note You were cared for by a hospitalist during your hospital stay. If you have any questions about your discharge medications or the care you received while you were in the hospital after you are discharged, you can call the unit and asked to speak with the hospitalist on call if the hospitalist that took  care of you is not available. Once you are discharged, your primary care physician will handle any further medical issues. Please note that NO REFILLS for any discharge medications will be authorized once you are discharged, as it is imperative that you return to your primary care physician (or establish a relationship with a primary care physician if you do not have one) for your aftercare needs so that they can reassess your need for medications and monitor your lab values.    Follow ups:    Follow-up Information     Armanda Heritage, NP Follow up.   Specialty: Nurse Practitioner Contact information: [place] [address] [phone]         Carol Ada, MD Follow up.   Specialty: Gastroenterology Contact information: [address] [place] [address] [phone]                 Wound care:     Discharge Exam:   Vitals:   08/07/21  1410 08/07/21 1833 08/07/21 2042 08/08/21 0451  BP: (!) 142/87 (!) 145/87 (!) 138/91 128/74  Pulse: 100 69 90 (!) 57  Resp: 20  18 15   Temp: 98.8 F (37.1 C)  99.1 F (37.3 C) 98.3 F (36.8 C)  TempSrc: Oral  Oral Oral  SpO2: 95%  100% 100%  Weight:      Height:        Body mass index is 38.73 kg/m.  General exam: Pleasant young African-American female.  Not in distress Skin: No rashes, lesions or ulcers. HEENT: Atraumatic, normocephalic, no obvious bleeding Lungs: Clear to auscultation bilaterally CVS: Regular rate and rhythm, no murmur GI/Abd soft, improved tenderness, bowel sound present, CNS: Alert, awake, oriented x3 Psychiatry: Mood appropriate Extremities: No pedal edema, no calf tenderness  Time coordinating discharge: 35 minutes   The results of significant diagnostics from this hospitalization (including imaging, microbiology, ancillary and laboratory) are listed below for reference.    Procedures and Diagnostic Studies:   CT Abdomen Pelvis W Contrast  Result  Date: 08/04/2021 CLINICAL DATA:  Generalized abdominal pain and vomiting since this morning. History of Crohn's. EXAM: CT ABDOMEN AND PELVIS WITH CONTRAST TECHNIQUE: Multidetector CT imaging of the abdomen and pelvis was performed using the standard protocol following bolus administration of intravenous contrast. CONTRAST:  86m OMNIPAQUE IOHEXOL 350 MG/ML SOLN COMPARISON:  June 09, 2021 FINDINGS: Lower chest: No acute abnormality. Hepatobiliary: No suspicious hepatic lesion. Gallbladder is unremarkable. No biliary ductal dilation. Pancreas: No pancreatic ductal dilatation or surrounding inflammatory changes. Spleen: Within normal limits. Adrenals/Urinary Tract: Adrenal glands are unremarkable. Kidneys are normal, without renal calculi, solid enhancing lesion, or hydronephrosis. Bladder is unremarkable. Stomach/Bowel: Known tear contrast administered. Stomach is unremarkable for degree of distension. No pathologic dilation of small or large bowel. The appendix is unremarkable. Fatty infiltration of the terminal ileum and ascending/transverse colon wall. Short segment of colonic wall thickening involving the proximal transverse colon on image 32/2. No pneumatosis. Vascular/Lymphatic: No pathologically enlarged abdominal or pelvic lymph nodes. No significant vascular findings. Reproductive: Uterus and bilateral adnexa are unremarkable. Other: No significant abdominopelvic free fluid. No pneumoperitoneum. Musculoskeletal: No acute or significant osseous findings. IMPRESSION: 1. No evidence of appendicitis, bowel obstruction or perforation. 2. Short segment of colonic wall thickening involving the proximal transverse colon. Multifocal fatty infiltration of the terminal ileum and colon, compatible with patient's given diagnosis of Crohn's. Constellation of findings which likely reflecting acute on chronic Crohn's disease. Additional areas of mild wall thickening of bowel are difficult to assess due to incomplete  distension. No walled off fluid or pneumoperitoneum. Electronically Signed   By: JDahlia BailiffM.D.   On: 08/04/2021 18:59     Labs:   Basic Metabolic Panel: Recent Labs  Lab 08/04/21 1448 08/05/21 0358 08/06/21 0355 08/07/21 0424 08/08/21 0405  NA 137 137 134* 137 140  K 3.2* 3.4* 3.8 3.0* 3.6  CL 105 106 107 102 108  CO2 22 22 21* 21* 23  GLUCOSE 108* 126* 127* 125* 125*  BUN 6 9 10 10 17   CREATININE 0.61 0.74 0.77 0.74 0.86  CALCIUM 8.6* 8.9 8.7* 9.2 9.0   GFR Estimated Creatinine Clearance: 95 mL/min (by C-G formula based on SCr of 0.86 mg/dL). Liver Function Tests: Recent Labs  Lab 08/04/21 1448 08/05/21 0358  AST 20 18  ALT 14 12  ALKPHOS 63 60  BILITOT 0.5 0.7  PROT 8.6* 8.1  ALBUMIN 4.3 3.9   Recent Labs  Lab 08/04/21 1448  LIPASE 29  No results for input(s): AMMONIA in the last 168 hours. Coagulation profile No results for input(s): INR, PROTIME in the last 168 hours.  CBC: Recent Labs  Lab 08/04/21 1448 08/05/21 0358 08/07/21 0424 08/08/21 0405  WBC 15.7* 13.6* 15.6* 13.5*  NEUTROABS 14.3*  --  13.5* 11.8*  HGB 14.0 13.6 15.5* 14.9  HCT 42.2 41.3 46.3* 44.7  MCV 76.4* 76.2* 75.3* 75.9*  PLT 364 376 363 347   Cardiac Enzymes: No results for input(s): CKTOTAL, CKMB, CKMBINDEX, TROPONINI in the last 168 hours. BNP: Invalid input(s): POCBNP CBG: No results for input(s): GLUCAP in the last 168 hours. D-Dimer No results for input(s): DDIMER in the last 72 hours. Hgb A1c No results for input(s): HGBA1C in the last 72 hours. Lipid Profile No results for input(s): CHOL, HDL, LDLCALC, TRIG, CHOLHDL, LDLDIRECT in the last 72 hours. Thyroid function studies No results for input(s): TSH, T4TOTAL, T3FREE, THYROIDAB in the last 72 hours.  Invalid input(s): FREET3 Anemia work up No results for input(s): VITAMINB12, FOLATE, FERRITIN, TIBC, IRON, RETICCTPCT in the last 72 hours. Microbiology Recent Results (from the past 240 hour(s))  Resp  Panel by RT-PCR (Flu A&B, Covid) Nasopharyngeal Swab     Status: None   Collection Time: 08/04/21  9:40 PM   Specimen: Nasopharyngeal Swab; Nasopharyngeal(NP) swabs in vial transport medium  Result Value Ref Range Status   SARS Coronavirus 2 by RT PCR NEGATIVE NEGATIVE Final    Comment: (NOTE) SARS-CoV-2 target nucleic acids are NOT DETECTED.  The SARS-CoV-2 RNA is generally detectable in upper respiratory specimens during the acute phase of infection. The lowest concentration of SARS-CoV-2 viral copies this assay can detect is 138 copies/mL. A negative result does not preclude SARS-Cov-2 infection and should not be used as the sole basis for treatment or other patient management decisions. A negative result may occur with  improper specimen collection/handling, submission of specimen other than nasopharyngeal swab, presence of viral mutation(s) within the areas targeted by this assay, and inadequate number of viral copies(<138 copies/mL). A negative result must be combined with clinical observations, patient history, and epidemiological information. The expected result is Negative.  Fact Sheet for Patients:  EntrepreneurPulse.com.au  Fact Sheet for Healthcare Providers:  IncredibleEmployment.be  This test is no t yet approved or cleared by the [place] FDA and  has been authorized for detection and/or diagnosis of SARS-CoV-2 by FDA under an Emergency Use Authorization (EUA). This EUA will remain  in effect (meaning this test can be used) for the duration of the COVID-19 declaration under Section 564(b)(1) of the Act, 21 U.S.C.section 360bbb-3(b)(1), unless the authorization is terminated  or revoked sooner.       Influenza A by PCR NEGATIVE NEGATIVE Final   Influenza B by PCR NEGATIVE NEGATIVE Final    Comment: (NOTE) The Xpert Xpress SARS-CoV-2/FLU/RSV plus assay is intended as an aid in the diagnosis of influenza from Nasopharyngeal  swab specimens and should not be used as a sole basis for treatment. Nasal washings and aspirates are unacceptable for Xpert Xpress SARS-CoV-2/FLU/RSV testing.  Fact Sheet for Patients: EntrepreneurPulse.com.au  Fact Sheet for Healthcare Providers: IncredibleEmployment.be  This test is not yet approved or cleared by the [place] FDA and has been authorized for detection and/or diagnosis of SARS-CoV-2 by FDA under an Emergency Use Authorization (EUA). This EUA will remain in effect (meaning this test can be used) for the duration of the COVID-19 declaration under Section 564(b)(1) of the Act, 21 U.S.C. section 360bbb-3(b)(1), unless the  authorization is terminated or revoked.  Performed at [facility], [place] [address], [place], [place] [address]      Signed: Marlowe Aschoff Jerene Yeager  [facility] 08/08/2021, 12:12 PM

## 2021-12-01 NOTE — L&D Delivery Note (Cosign Needed Addendum)
Delivery Note Progressed to complete dilation with some deep variable decels with contractions.  Pushed well to delivery    NICU team in room  At 7:12 AM a viable and healthy female was delivered via  (Presentation:  LOA  ).  APGAR: , ; weight  .   Placenta status: Spontaneous, Intact.  Cord:   with the following complications:  .Nuchal cord x 1 reduced  Anesthesia: Epidural Episiotomy:  none Lacerations:  PeriClitoral Suture Repair: 3.0 vicryl Est. Blood Loss (mL):  110  Mom to postpartum.  Baby to Couplet care / Skin to Skin.  Hansel Feinstein 06/20/2022, 7:38 AM

## 2021-12-04 ENCOUNTER — Ambulatory Visit (INDEPENDENT_AMBULATORY_CARE_PROVIDER_SITE_OTHER): Payer: 59

## 2021-12-04 ENCOUNTER — Other Ambulatory Visit: Payer: Self-pay

## 2021-12-04 VITALS — BP 110/73 | HR 73 | Ht 60.0 in | Wt 205.0 lb

## 2021-12-04 DIAGNOSIS — N912 Amenorrhea, unspecified: Secondary | ICD-10-CM

## 2021-12-04 DIAGNOSIS — Z3201 Encounter for pregnancy test, result positive: Secondary | ICD-10-CM

## 2021-12-04 LAB — POCT URINE PREGNANCY: Preg Test, Ur: POSITIVE — AB

## 2021-12-04 NOTE — Progress Notes (Signed)
Frances Mccormick presents today for UPT. She has no unusual complaints. LMP: 10/14/2021    OBJECTIVE: Appears well, in no apparent distress.  OB History     Gravida  3   Para  0   Term      Preterm      AB  1   Living         SAB      IAB      Ectopic      Multiple      Live Births             Home UPT Result: POSITIVE In-Office UPT result:POSITIVE  I have reviewed the patient's medical, obstetrical, social, and family histories, and medications.   ASSESSMENT: Positive pregnancy test LMP  10/14/2021 EDD   07/21/2022 GA      [redacted]w[redacted]d  PLAN Prenatal care to be completed at: [facility]

## 2021-12-04 NOTE — Progress Notes (Signed)
Agree with nurses's documentation of this patient's clinic encounter.  Chancy Milroy, MD

## 2021-12-16 ENCOUNTER — Ambulatory Visit (INDEPENDENT_AMBULATORY_CARE_PROVIDER_SITE_OTHER): Payer: 59

## 2021-12-16 ENCOUNTER — Other Ambulatory Visit: Payer: Self-pay

## 2021-12-16 ENCOUNTER — Ambulatory Visit (INDEPENDENT_AMBULATORY_CARE_PROVIDER_SITE_OTHER): Payer: Self-pay

## 2021-12-16 VITALS — BP 125/83 | HR 80 | Ht 60.0 in | Wt 207.1 lb

## 2021-12-16 DIAGNOSIS — O0990 Supervision of high risk pregnancy, unspecified, unspecified trimester: Secondary | ICD-10-CM | POA: Insufficient documentation

## 2021-12-16 DIAGNOSIS — Z3481 Encounter for supervision of other normal pregnancy, first trimester: Secondary | ICD-10-CM

## 2021-12-16 DIAGNOSIS — Z3A09 9 weeks gestation of pregnancy: Secondary | ICD-10-CM | POA: Diagnosis not present

## 2021-12-16 DIAGNOSIS — O3680X0 Pregnancy with inconclusive fetal viability, not applicable or unspecified: Secondary | ICD-10-CM

## 2021-12-16 DIAGNOSIS — Z3491 Encounter for supervision of normal pregnancy, unspecified, first trimester: Secondary | ICD-10-CM

## 2021-12-16 DIAGNOSIS — O219 Vomiting of pregnancy, unspecified: Secondary | ICD-10-CM

## 2021-12-16 HISTORY — DX: Supervision of high risk pregnancy, unspecified, unspecified trimester: O09.90

## 2021-12-16 MED ORDER — DOXYLAMINE-PYRIDOXINE 10-10 MG PO TBEC: 2.0000 | DELAYED_RELEASE_TABLET | Freq: Every day | ORAL | 5 refills | Status: DC

## 2021-12-16 NOTE — Progress Notes (Signed)
New OB Intake  I connected with  Frances Mccormick on 12/16/21 at  8:15 AM EST by in person and verified that I am speaking with the correct person using two identifiers. Nurse is located at [facility] and pt is located at [place].  I discussed the limitations, risks, security and privacy concerns of performing an evaluation and management service by telephone and the availability of in person appointments. I also discussed with the patient that there may be a patient responsible charge related to this service. The patient expressed understanding and agreed to proceed.  I explained I am completing New OB Intake today. We discussed her EDD of 07/21/22 that is based on LMP of 10/14/21. Pt is G3/P0020. I reviewed her allergies, medications, Medical/Surgical/OB history, and appropriate screenings. I informed her of [facility] services. Based on history, this is a/an  pregnancy uncomplicated .   Patient Active Problem List   Diagnosis Date Noted   Exacerbation of Crohn's disease without complication ([place]) 70/62/3762   Colitis 06/07/2021   Enterocolitis 06/07/2021   Hypertension 09/30/2018   Hypokalemia 09/30/2018   Leukocytosis 09/30/2018   Pilonidal abscess 09/30/2018   Intractable nausea and vomiting 09/30/2018   Chronic hypertension during pregnancy, antepartum 08/11/2018   Nausea & vomiting 07/26/2018   Medically noncompliant 10/26/2014   Crohn's disease ([place]) 10/26/2014    Concerns addressed today  Delivery Plans:  Plans to deliver at [facility].   MyChart/Babyscripts MyChart access verified. I explained pt will have some visits in office and some virtually. Babyscripts instructions given and order placed. Patient verifies receipt of registration text/e-mail. Account successfully created and app downloaded.  Blood Pressure Cuff  Patient has access to a BP cuff at home. Explained after first prenatal appt pt will check weekly and document in 92.  Weight scale: Patient does not  have  weight scale. Patient has private insurance  Anatomy US Explained first scheduled [place] will be around 19 weeks. Dating and viability scan performed today.  Labs Discussed Johnsie Cancel genetic screening with patient. Would like both Panorama and Horizon drawn at new OB visit. Routine prenatal labs needed.  Covid Vaccine Patient has covid vaccine.   Informed patient of [facility] website  and placed link in her AVS.   Social Determinants of Health Food Insecurity: Patient denies food insecurity. WIC Referral: Patient is interested in referral to [facility].  Transportation: Patient denies transportation needs. Childcare: Discussed no children allowed at ultrasound appointments. Offered childcare services; patient declines childcare services at this time.  Send link to Pregnancy Navigators   Placed OB Box on problem list and updated  First visit review I reviewed new OB appt with pt. I explained she will have a pelvic exam, ob bloodwork with genetic screening, and PAP smear. Explained pt will be seen by Baltazar Najjar at first visit; encounter routed to appropriate provider. Explained that patient will be seen by pregnancy navigator following visit with provider. [facility] information placed in AVS.   Lucianne Lei, RN 12/16/2021  8:27 AM

## 2021-12-17 ENCOUNTER — Other Ambulatory Visit: Payer: Self-pay

## 2021-12-17 DIAGNOSIS — O219 Vomiting of pregnancy, unspecified: Secondary | ICD-10-CM

## 2021-12-17 MED ORDER — DICLEGIS 10-10 MG PO TBEC: 2.0000 | DELAYED_RELEASE_TABLET | Freq: Every day | ORAL | 5 refills | Status: DC

## 2021-12-18 ENCOUNTER — Telehealth: Payer: Self-pay

## 2021-12-18 NOTE — Telephone Encounter (Signed)
Returned call, no answer, left vm 

## 2021-12-19 ENCOUNTER — Other Ambulatory Visit: Payer: Self-pay

## 2021-12-19 ENCOUNTER — Telehealth: Payer: Self-pay

## 2021-12-19 MED ORDER — PROMETHAZINE HCL 25 MG PO TABS: 25.0000 mg | ORAL_TABLET | Freq: Four times a day (QID) | ORAL | 0 refills | Status: DC | PRN

## 2021-12-19 NOTE — Progress Notes (Signed)
Pt reports her insurance will not cover Diclegis and it is too expensive for her to pay out of pocket.  Promethazine 25 mg sent to pharmacy per protocol.

## 2021-12-19 NOTE — Telephone Encounter (Signed)
Pt called to report intermittent heart palpitations lasting a few seconds and SHOB since yesterday. Pt has chronic HTN but unable to check BP; she is at work. Pt denies dizziness, lightheadedness, visual disturbances, or feeling faint. NOB scheduled 01/02/22. Pt to report to nearest Urgent or MAU for further testing.

## 2021-12-20 ENCOUNTER — Emergency Department (HOSPITAL_COMMUNITY)
Admission: EM | Admit: 2021-12-20 | Discharge: 2021-12-20 | Disposition: A | Discharge status: Home / Self Care | Payer: 59 | Attending: Emergency Medicine | Admitting: Emergency Medicine

## 2021-12-20 ENCOUNTER — Other Ambulatory Visit: Payer: Self-pay

## 2021-12-20 ENCOUNTER — Encounter (HOSPITAL_COMMUNITY): Payer: Self-pay

## 2021-12-20 DIAGNOSIS — R002 Palpitations: Secondary | ICD-10-CM | POA: Insufficient documentation

## 2021-12-20 LAB — MAGNESIUM: Magnesium: 2.1 mg/dL (ref 1.7–2.4)

## 2021-12-20 LAB — BASIC METABOLIC PANEL
Anion gap: 9 (ref 5–15)
BUN: 8 mg/dL (ref 6–20)
CO2: 25 mmol/L (ref 22–32)
Calcium: 9.2 mg/dL (ref 8.9–10.3)
Chloride: 99 mmol/L (ref 98–111)
Creatinine, Ser: 0.6 mg/dL (ref 0.44–1.00)
GFR, Estimated: >60 mL/min (ref >60)
Glucose, Bld: 101 mg/dL — ABNORMAL HIGH (ref 70–99)
Potassium: 3.5 mmol/L (ref 3.5–5.1)
Sodium: 133 mmol/L — ABNORMAL LOW (ref 135–145)

## 2021-12-20 LAB — CBC
HCT: 40.2 % (ref 36.0–46.0)
Hemoglobin: 13.4 g/dL (ref 12.0–15.0)
MCH: 26.1 pg (ref 26.0–34.0)
MCHC: 33.3 g/dL (ref 30.0–36.0)
MCV: 78.4 fL — ABNORMAL LOW (ref 80.0–100.0)
Platelets: 381 10*3/uL (ref 150–400)
RBC: 5.13 MIL/uL — ABNORMAL HIGH (ref 3.87–5.11)
RDW: 14.9 % (ref 11.5–15.5)
WBC: 14.7 10*3/uL — ABNORMAL HIGH (ref 4.0–10.5)
nRBC: 0 % (ref 0.0–0.2)

## 2021-12-20 LAB — TSH: TSH: 0.748 u[IU]/mL (ref 0.350–4.500)

## 2021-12-20 NOTE — ED Provider Notes (Signed)
[name] Provider Note   CSN: [id number] Arrival date & time: 12/20/21  1528     History  Chief Complaint  Patient presents with   Palpitations    Frances Mccormick is a 36 y.o. female.  36 year old female currently [redacted] weeks pregnant presents with palpitations.  Has had these for several days.  Did get worse when she stands up and then resolved.  Slight dyspnea but then resolves.  Denies any fever or cough or congestion.  No leg pain or swelling.  Patient has been dealing with hyperemesis and was recently scribed Phenergan.  States that she has had decreased oral intake due to to the emesis.  Denies any abdominal pain.  No vaginal bleeding or cramping.  Is better when she is resting.  Called her doctor and told to come here      Home Medications Prior to Admission medications   Medication Sig Start Date End Date Taking? Authorizing Provider  DICLEGIS 10-10 MG TBEC Take 2 tablets by mouth at bedtime. If symptoms persist, add one tablet in the morning and one in the afternoon 12/17/21   Woodroe Mode, MD  Prenatal Vit-Fe Fumarate-FA (PRENATAL VITAMINS PO) Take by mouth.    [provider]  promethazine (PHENERGAN) 25 MG tablet Take 1 tablet (25 mg total) by mouth every 6 (six) hours as needed for nausea or vomiting. 12/19/21   Constant, Peggy, MD  famotidine (PEPCID) 20 MG tablet Take 1 tablet (20 mg total) by mouth 2 (two) times daily. Patient not taking: Reported on 03/02/2020 10/03/18 03/23/20  Debbe Odea, MD  metoCLOPramide (REGLAN) 10 MG tablet Take 1 tablet (10 mg total) by mouth every 8 (eight) hours as needed for nausea. Patient not taking: Reported on 03/02/2020 10/03/18 03/23/20  Debbe Odea, MD  potassium chloride SA (K-DUR,KLOR-CON) 20 MEQ tablet Take 2 tablets (40 mEq total) by mouth daily for 4 days. Patient not taking: Reported on 03/02/2020 10/03/18 03/23/20  Debbe Odea, MD      Allergies    Bee venom and Ciprofloxacin     Review of Systems   Review of Systems  All other systems reviewed and are negative.  Physical Exam Updated Vital Signs BP (!) 147/80 (BP Location: Left Arm)    Pulse 89    Temp 98.4 F (36.9 C) (Oral)    Resp 16    Ht 1.524 m (5')    Wt 92.5 kg    LMP 10/14/2021 (Exact Date)    SpO2 100%    BMI 39.84 kg/m  Physical Exam Vitals and nursing note reviewed.  Constitutional:      General: She is not in acute distress.    Appearance: Normal appearance. She is well-developed. She is not toxic-appearing.  HENT:     Head: Normocephalic and atraumatic.  Eyes:     General: Lids are normal.     Conjunctiva/sclera: Conjunctivae normal.     Pupils: Pupils are equal, round, and reactive to light.  Neck:     Thyroid: No thyroid mass.     Trachea: No tracheal deviation.  Cardiovascular:     Rate and Rhythm: Normal rate and regular rhythm.     Heart sounds: Normal heart sounds. No murmur heard.   No gallop.  Pulmonary:     Effort: Pulmonary effort is normal. No respiratory distress.     Breath sounds: Normal breath sounds. No stridor. No decreased breath sounds, wheezing, rhonchi or rales.  Abdominal:  General: There is no distension.     Palpations: Abdomen is soft.     Tenderness: There is no abdominal tenderness. There is no rebound.  Musculoskeletal:        General: No tenderness. Normal range of motion.     Cervical back: Normal range of motion and neck supple.  Skin:    General: Skin is warm and dry.     Findings: No abrasion or rash.  Neurological:     Mental Status: She is alert and oriented to person, place, and time. Mental status is at baseline.     GCS: GCS eye subscore is 4. GCS verbal subscore is 5. GCS motor subscore is 6.     Cranial Nerves: No cranial nerve deficit.     Sensory: No sensory deficit.     Motor: Motor function is intact.  Psychiatric:        Attention and Perception: Attention normal.        Speech: Speech normal.        Behavior: Behavior normal.     ED Results / Procedures / Treatments   Labs (all labs ordered are listed, but only abnormal results are displayed) Labs Reviewed  BASIC METABOLIC PANEL - Abnormal; Notable for the following components:      Result Value   Sodium 133 (*)    Glucose, Bld 101 (*)    All other components within normal limits  CBC - Abnormal; Notable for the following components:   WBC 14.7 (*)    RBC 5.13 (*)    MCV 78.4 (*)    All other components within normal limits  MAGNESIUM  TSH    EKG EKG Interpretation  Date/Time:  Friday December 20 2021 15:36:56 EST Ventricular Rate:  88 PR Interval:  140 QRS Duration: 83 QT Interval:  354 QTC Calculation: 429 R Axis:   12 Text Interpretation: Sinus rhythm Left atrial enlargement Confirmed by Lacretia Leigh ([id number]) on 12/20/2021 4:35:12 PM  Radiology No results found.  Procedures Procedures    Medications Ordered in ED Medications - No data to display  ED Course/ Medical Decision Making/ A&P                           Medical Decision Making Amount and/or Complexity of Data Reviewed Labs: ordered.   Patient is EKG without ischemic changes.  Per my interpretation.  Patient's labs are reassuring here.  No evidence of anemia.  Suspect some volume depletion here.  Blood pressure noted to be slightly elevated.  No history of hypertension was instructed to follow-up with her doctor for this.  Extremity low suspicion for other etiologies such as PE.  Lungs are clear.  Do not see indication to order chest x-ray.  Will discharge home at this time with return precautions        Final Clinical Impression(s) / ED Diagnoses Final diagnoses:  None    Rx / DC Orders ED Discharge Orders     None         Lacretia Leigh, MD 12/20/21 1655

## 2021-12-20 NOTE — ED Triage Notes (Signed)
Patient reports that she has been having heart palpitations x 2 days and occurs when she gets up and walks around.  Patient states she is [redacted] weeks pregnant.

## 2021-12-20 NOTE — Discharge Instructions (Signed)
Drink plenty of liquids.  Follow-up with your doctor

## 2021-12-20 NOTE — ED Provider Triage Note (Signed)
Emergency Medicine Provider Triage Evaluation Note  Frances Mccormick , a 36 y.o. female  was evaluated in triage.  Pt complains of palpitations.  Patient is currently [redacted] weeks pregnant, over the past 2 days she has noticed palpitations that occur intermittently.  She notices them primarily when she gets up to walk around and then they seem to persist for a bit and then eventually resolve with rest.  She reports that it feels as her heart is beating fast and irregularly, no prior history of the same.  No associated chest pain.  She reports when symptoms become severe she feels a bit short of breath and lightheaded.  No complications with pregnancy thus far and has had an ultrasound that confirmed IUP.  Has had some nausea and decreased appetite and was prescribed Phenergan for vomiting in pregnancy.  Followed by Dr. Jodi Mourning with OB/GYN  Review of Systems  Positive: Palpitations, lightheadedness, shortness of breath Negative: Fevers, chest pain, abdominal pain, vaginal bleeding  Physical Exam  BP (!) 147/80 (BP Location: Left Arm)    Pulse 89    Temp 98.4 F (36.9 C) (Oral)    Resp 16    Ht 5' (1.524 m)    Wt 92.5 kg    LMP 10/14/2021 (Exact Date)    SpO2 100%    BMI 39.84 kg/m  Gen:   Awake, no distress   Resp:  Normal effort  Cardiac: RRR MSK:   Moves extremities without difficulty  Other:    Medical Decision Making  Medically screening exam initiated at 3:38 PM.  Appropriate orders placed.  SKYYLAR KOPF was informed that the remainder of the evaluation will be completed by another provider, this initial triage assessment does not replace that evaluation, and the importance of remaining in the ED until their evaluation is complete.     Jacqlyn Larsen, [place] 12/20/21 1547

## 2021-12-30 ENCOUNTER — Inpatient Hospital Stay (HOSPITAL_COMMUNITY)
Admission: AD | Admit: 2021-12-30 | Discharge: 2021-12-30 | Disposition: A | Discharge status: Home / Self Care | Payer: 59 | Attending: Family Medicine | Admitting: Family Medicine

## 2021-12-30 ENCOUNTER — Encounter (HOSPITAL_COMMUNITY): Payer: Self-pay | Admitting: Family Medicine

## 2021-12-30 ENCOUNTER — Telehealth: Payer: Self-pay

## 2021-12-30 ENCOUNTER — Other Ambulatory Visit: Payer: Self-pay

## 2021-12-30 DIAGNOSIS — N898 Other specified noninflammatory disorders of vagina: Secondary | ICD-10-CM | POA: Insufficient documentation

## 2021-12-30 DIAGNOSIS — O09521 Supervision of elderly multigravida, first trimester: Secondary | ICD-10-CM | POA: Diagnosis not present

## 2021-12-30 DIAGNOSIS — O26891 Other specified pregnancy related conditions, first trimester: Secondary | ICD-10-CM | POA: Insufficient documentation

## 2021-12-30 DIAGNOSIS — Z3A11 11 weeks gestation of pregnancy: Secondary | ICD-10-CM | POA: Insufficient documentation

## 2021-12-30 LAB — URINALYSIS, ROUTINE W REFLEX MICROSCOPIC
Bilirubin Urine: NEGATIVE
Glucose, UA: NEGATIVE mg/dL
Ketones, ur: NEGATIVE mg/dL
Leukocytes,Ua: NEGATIVE
Nitrite: NEGATIVE
Protein, ur: NEGATIVE mg/dL
Specific Gravity, Urine: 1.01 (ref 1.005–1.030)
pH: 7.5 (ref 5.0–8.0)

## 2021-12-30 LAB — WET PREP, GENITAL
Clue Cells Wet Prep HPF POC: NONE SEEN
Sperm: NONE SEEN
Trich, Wet Prep: NONE SEEN
WBC, Wet Prep HPF POC: <10 (ref <10)
Yeast Wet Prep HPF POC: NONE SEEN

## 2021-12-30 LAB — URINALYSIS, MICROSCOPIC (REFLEX)

## 2021-12-30 NOTE — Telephone Encounter (Signed)
Patient called stating that she has been having really bad cramps and brown discharge that started Saturday. Patient describes cramping as a really bad period cramp. She states that her cramping is still present, but not as bab. She also states that she is still having some brown discharge when she wipes. Denies having any intercourse or anything else in the vagina.  Patient advised to go MAU for evaluation.

## 2021-12-30 NOTE — MAU Note (Signed)
Frances Mccormick is a 36 y.o. at [redacted]w[redacted]d here in MAU reporting: has been seeing light brown discharge since Friday. The discharge went to dark brown but then now it is back to light brown. Was having some cramping but states no pain at this time.  Onset of complaint: Friday  Pain score: 0/10  Vitals:   12/30/21 1040  BP: 127/64  Pulse: 89  Resp: 16  Temp: 99 F (37.2 C)  SpO2: 100%     FHT:175  Lab orders placed from triage: UA

## 2021-12-30 NOTE — MAU Provider Note (Signed)
History     CSN: [id number]  Arrival date and time: 12/30/21 1000   Event Date/Time   First Provider Initiated Contact with Patient 12/30/21 1050      Chief Complaint  Patient presents with   Vaginal Discharge   HPI This is a 36 year old G3, P0 at 11 weeks 0 days.  She presents with brown vaginal discharge since Friday.  It appears to be lightening up.  She denies bleeding, leaking fluid, cramping, pain.  OB History     Gravida  3   Para  0   Term      Preterm      AB  2   Living         SAB      IAB      Ectopic      Multiple      Live Births              Past Medical History:  Diagnosis Date   Crohn's colitis ([place])    Migraine    "a few/year" (07/27/2018)   Non-compliant patient     Past Surgical History:  Procedure Laterality Date   INDUCED ABORTION      Family History  Problem Relation Age of Onset   Hypertension Mother    Diabetes Mother    Heart disease Mother    Diabetes Father    Hypertension Maternal Grandmother    Stroke Maternal Grandmother    Hypertension Maternal Grandfather    Diabetes Maternal Grandfather    Breast cancer Maternal Aunt     Social History   Tobacco Use   Smoking status: Never   Smokeless tobacco: Never  Vaping Use   Vaping Use: Never used  Substance Use Topics   Alcohol use: Not Currently    Alcohol/week: 2.0 standard drinks    Types: 1 Glasses of wine, 1 Cans of beer per week    Comment: 1 x per week, prior to pregnancy   Drug use: Not Currently    Types: Marijuana    Comment: occaisonally, not since confirmed pregnancy    Allergies:  Allergies  Allergen Reactions   Bee Venom Anaphylaxis   Ciprofloxacin Anaphylaxis    Medications Prior to Admission  Medication Sig Dispense Refill Last Dose   DICLEGIS 10-10 MG TBEC Take 2 tablets by mouth at bedtime. If symptoms persist, add one tablet in the morning and one in the afternoon 100 tablet 5    Prenatal Vit-Fe Fumarate-FA (PRENATAL VITAMINS  PO) Take by mouth.      promethazine (PHENERGAN) 25 MG tablet Take 1 tablet (25 mg total) by mouth every 6 (six) hours as needed for nausea or vomiting. 30 tablet 0     Review of Systems Physical Exam   Blood pressure 127/64, pulse 89, temperature 99 F (37.2 C), temperature source Oral, resp. rate 16, last menstrual period 10/14/2021, SpO2 100 %, unknown if currently breastfeeding.  Physical Exam Vitals reviewed.  Constitutional:      Appearance: Normal appearance.  Cardiovascular:     Rate and Rhythm: Normal rate and regular rhythm.  Pulmonary:     Effort: Pulmonary effort is normal.  Abdominal:     General: Abdomen is flat.     Palpations: Abdomen is soft.  Skin:    General: Skin is warm and dry.     Capillary Refill: Capillary refill takes less than 2 seconds.  Neurological:     General: No focal deficit present.     Mental  Status: She is alert.  Psychiatric:        Mood and Affect: Mood normal.        Behavior: Behavior normal.        Thought Content: Thought content normal.        Judgment: Judgment normal.    MAU Course  Procedures  MDM Pt informed that the ultrasound is considered a limited OB ultrasound and is not intended to be a complete ultrasound exam.  Patient also informed that the ultrasound is not being completed with the intent of assessing for fetal or placental anomalies or any pelvic abnormalities.  Explained that the purpose of todays ultrasound is to assess for  viability.  Patient acknowledges the purpose of the exam and the limitations of the study.   IUP measuring 11 weeks 2 days by crown-rump length.  Heart rate was 175.  Placenta appears normal without subchorionic hematoma.  Assessment and Plan   1. [redacted] weeks gestation of pregnancy   2. Vaginal discharge during pregnancy in first trimester    Patient reassured. Neg wet prep. No further workup needed. Discharge to home.   Frances Mccormick 12/30/2021, 11:11 AM

## 2022-01-02 ENCOUNTER — Other Ambulatory Visit: Payer: Self-pay

## 2022-01-02 ENCOUNTER — Ambulatory Visit (INDEPENDENT_AMBULATORY_CARE_PROVIDER_SITE_OTHER): Payer: 59 | Admitting: Licensed Clinical Social Worker

## 2022-01-02 ENCOUNTER — Other Ambulatory Visit (HOSPITAL_COMMUNITY)
Admission: RE | Admit: 2022-01-02 | Discharge: 2022-01-02 | Disposition: A | Discharge status: Home / Self Care | Payer: 59 | Source: Ambulatory Visit | Attending: Obstetrics | Admitting: Obstetrics

## 2022-01-02 ENCOUNTER — Ambulatory Visit (INDEPENDENT_AMBULATORY_CARE_PROVIDER_SITE_OTHER): Payer: 59 | Admitting: Obstetrics

## 2022-01-02 ENCOUNTER — Encounter: Payer: Self-pay | Admitting: Obstetrics

## 2022-01-02 VITALS — BP 112/72 | HR 80 | Wt 207.8 lb

## 2022-01-02 DIAGNOSIS — Z3491 Encounter for supervision of normal pregnancy, unspecified, first trimester: Secondary | ICD-10-CM

## 2022-01-02 DIAGNOSIS — Z3A11 11 weeks gestation of pregnancy: Secondary | ICD-10-CM

## 2022-01-02 DIAGNOSIS — K5090 Crohn's disease, unspecified, without complications: Secondary | ICD-10-CM

## 2022-01-02 DIAGNOSIS — Z3481 Encounter for supervision of other normal pregnancy, first trimester: Secondary | ICD-10-CM

## 2022-01-02 MED ORDER — PNV-DHA+DOCUSATE 27-1.25-300 MG PO CAPS: 1.0000 | ORAL_CAPSULE | Freq: Every day | ORAL | 4 refills | Status: DC

## 2022-01-02 NOTE — Progress Notes (Signed)
Patient presents for Initial OB visit. Patient has had her dating and viability scan performed. This is an unplanned pregnancy. She does not currently live with her partner. Patient has no concerns today.

## 2022-01-02 NOTE — Addendum Note (Signed)
Addended by: [facility] Bombard on: 01/02/2022 09:36 AM   Modules accepted: Orders

## 2022-01-02 NOTE — Progress Notes (Addendum)
Subjective:    Frances Mccormick is being seen today for her first obstetrical visit.  This is not a planned pregnancy. She is at [redacted]w[redacted]d gestation. Her obstetrical history is significant for obesity. Relationship with FOB: significant other, not living together. Patient does intend to breast feed. Pregnancy history fully reviewed.  The information documented in the HPI was reviewed and verified.  Menstrual History: OB History     Gravida  3   Para  0   Term      Preterm      AB  2   Living         SAB      IAB      Ectopic      Multiple      Live Births               Patient's last menstrual period was 10/14/2021 (exact date).    Past Medical History:  Diagnosis Date   Crohn's colitis ([place])    Migraine    "a few/year" (07/27/2018)   Non-compliant patient     Past Surgical History:  Procedure Laterality Date   INDUCED ABORTION      (Not in a hospital admission)  Allergies  Allergen Reactions   Bee Venom Anaphylaxis   Ciprofloxacin Anaphylaxis    Social History   Tobacco Use   Smoking status: Never   Smokeless tobacco: Never  Substance Use Topics   Alcohol use: Not Currently    Alcohol/week: 2.0 standard drinks    Types: 1 Glasses of wine, 1 Cans of beer per week    Comment: 1 x per week, prior to pregnancy    Family History  Problem Relation Age of Onset   Hypertension Mother    Diabetes Mother    Heart disease Mother    Diabetes Father    Hypertension Maternal Grandmother    Stroke Maternal Grandmother    Hypertension Maternal Grandfather    Diabetes Maternal Grandfather    Breast cancer Maternal Aunt      Review of Systems Constitutional: negative for weight loss Gastrointestinal: negative for vomiting Genitourinary:negative for genital lesions and vaginal discharge and dysuria Musculoskeletal:negative for back pain Behavioral/Psych: negative for abusive relationship, depression, illegal drug usage and tobacco use    Objective:     BP 112/72    Pulse 80    Wt 207 lb 12.8 oz (94.3 kg)    LMP 10/14/2021 (Exact Date)    BMI 40.58 kg/m  General Appearance:    Alert, cooperative, no distress, appears stated age  Head:    Normocephalic, without obvious abnormality, atraumatic  Eyes:    PERRL, conjunctiva/corneas clear, EOM's intact, fundi    benign, both eyes  Ears:    Normal TM's and external ear canals, both ears  Nose:   Nares normal, septum midline, mucosa normal, no drainage    or sinus tenderness  Throat:   Lips, mucosa, and tongue normal; teeth and gums normal  Neck:   Supple, symmetrical, trachea midline, no adenopathy;    thyroid:  no enlargement/tenderness/nodules; no carotid   bruit or JVD  Back:     Symmetric, no curvature, ROM normal, no CVA tenderness  Lungs:     Clear to auscultation bilaterally, respirations unlabored  Chest Wall:    No tenderness or deformity   Heart:    Regular rate and rhythm, S1 and S2 normal, no murmur, rub   or gallop  Breast Exam:    No tenderness,  masses, or nipple abnormality  Abdomen:     Soft, non-tender, bowel sounds active all four quadrants,    no masses, no organomegaly  Genitalia:    Normal female without lesion, discharge or tenderness  Extremities:   Extremities normal, atraumatic, no cyanosis or edema  Pulses:   2+ and symmetric all extremities  Skin:   Skin color, texture, turgor normal, no rashes or lesions  Lymph nodes:   Cervical, supraclavicular, and axillary nodes normal  Neurologic:   CNII-XII intact, normal strength, sensation and reflexes    throughout      Lab Review Urine pregnancy test Labs reviewed yes Radiologic studies reviewed yes  Assessment:    Pregnancy at [redacted]w[redacted]d weeks    Plan:   1. Encounter for supervision of normal pregnancy in first trimester, unspecified gravidity Rx: - Cytology - PAP( [place]) - Cervicovaginal ancillary only( [place]) - Hepatitis C Antibody - Genetic Screening - Culture, OB Urine - Pregnancy, Initial  Screen - Prenat-FeFum-DSS-FA-DHA w/o A (PNV-DHA+DOCUSATE) 27-1.25-300 MG CAPS; Take 1 capsule by mouth daily before breakfast.  Dispense: 90 capsule; Refill: 4  2. Crohn's disease in remission ([place])       Prenatal vitamins.  Counseling provided regarding continued use of seat belts, cessation of alcohol consumption, smoking or use of illicit drugs; infection precautions i.e., influenza/TDAP immunizations, toxoplasmosis,CMV, parvovirus, listeria and varicella; workplace safety, exercise during pregnancy; routine dental care, safe medications, sexual activity, hot tubs, saunas, pools, travel, caffeine use, fish and methlymercury, potential toxins, hair treatments, varicose veins Weight gain recommendations per IOM guidelines reviewed: underweight/BMI< 18.5--> gain 28 - 40 lbs; normal weight/BMI 18.5 - 24.9--> gain 25 - 35 lbs; overweight/BMI 25 - 29.9--> gain 15 - 25 lbs; obese/BMI >30->gain  11 - 20 lbs Problem list reviewed and updated. FIRST/CF mutation testing/NIPT/QUAD SCREEN/fragile X/Ashkenazi Jewish population testing/Spinal muscular atrophy discussed: requested. Role of ultrasound in pregnancy discussed; fetal survey: requested. Amniocentesis discussed: not indicated.   Orders Placed This Encounter  Procedures   Culture, OB Urine   Hepatitis C Antibody   Genetic Screening   Pregnancy, Initial Screen    Follow up in 4 weeks.  I have spent a total of 20 minutes of face-to-face time, excluding clinical staff time, reviewing notes and preparing to see patient, ordering tests and/or medications, and counseling the patient.   Shelly Bombard, MD 01/02/2022 9:06 AM

## 2022-01-02 NOTE — Addendum Note (Signed)
Addended by: Lucianne Lei on: 01/02/2022 10:03 AM   Modules accepted: Orders

## 2022-01-03 ENCOUNTER — Inpatient Hospital Stay (EMERGENCY_DEPARTMENT_HOSPITAL)
Admission: AD | Admit: 2022-01-03 | Discharge: 2022-01-04 | Disposition: A | Discharge status: Home / Self Care | Payer: 59 | Source: Home / Self Care | Attending: Obstetrics & Gynecology | Admitting: Obstetrics & Gynecology

## 2022-01-03 ENCOUNTER — Encounter (HOSPITAL_COMMUNITY): Payer: Self-pay | Admitting: Obstetrics and Gynecology

## 2022-01-03 ENCOUNTER — Encounter (HOSPITAL_COMMUNITY): Payer: Self-pay | Admitting: Obstetrics & Gynecology

## 2022-01-03 ENCOUNTER — Inpatient Hospital Stay (HOSPITAL_COMMUNITY)
Admission: AD | Admit: 2022-01-03 | Discharge: 2022-01-03 | Disposition: A | Discharge status: Home / Self Care | Payer: 59 | Attending: Obstetrics and Gynecology | Admitting: Obstetrics and Gynecology

## 2022-01-03 DIAGNOSIS — O210 Mild hyperemesis gravidarum: Secondary | ICD-10-CM | POA: Diagnosis not present

## 2022-01-03 DIAGNOSIS — O10919 Unspecified pre-existing hypertension complicating pregnancy, unspecified trimester: Secondary | ICD-10-CM | POA: Diagnosis not present

## 2022-01-03 DIAGNOSIS — F12188 Cannabis abuse with other cannabis-induced disorder: Secondary | ICD-10-CM | POA: Insufficient documentation

## 2022-01-03 DIAGNOSIS — O219 Vomiting of pregnancy, unspecified: Secondary | ICD-10-CM | POA: Insufficient documentation

## 2022-01-03 DIAGNOSIS — Z79899 Other long term (current) drug therapy: Secondary | ICD-10-CM | POA: Insufficient documentation

## 2022-01-03 DIAGNOSIS — R109 Unspecified abdominal pain: Secondary | ICD-10-CM | POA: Diagnosis not present

## 2022-01-03 DIAGNOSIS — O99321 Drug use complicating pregnancy, first trimester: Secondary | ICD-10-CM | POA: Insufficient documentation

## 2022-01-03 DIAGNOSIS — O09521 Supervision of elderly multigravida, first trimester: Secondary | ICD-10-CM | POA: Insufficient documentation

## 2022-01-03 DIAGNOSIS — O10911 Unspecified pre-existing hypertension complicating pregnancy, first trimester: Secondary | ICD-10-CM | POA: Insufficient documentation

## 2022-01-03 DIAGNOSIS — Z3689 Encounter for other specified antenatal screening: Secondary | ICD-10-CM | POA: Diagnosis not present

## 2022-01-03 DIAGNOSIS — R12 Heartburn: Secondary | ICD-10-CM | POA: Insufficient documentation

## 2022-01-03 DIAGNOSIS — O26891 Other specified pregnancy related conditions, first trimester: Secondary | ICD-10-CM | POA: Diagnosis not present

## 2022-01-03 DIAGNOSIS — Z3A11 11 weeks gestation of pregnancy: Secondary | ICD-10-CM | POA: Insufficient documentation

## 2022-01-03 DIAGNOSIS — Z3491 Encounter for supervision of normal pregnancy, unspecified, first trimester: Secondary | ICD-10-CM

## 2022-01-03 HISTORY — DX: Essential (primary) hypertension: I10

## 2022-01-03 LAB — COMPREHENSIVE METABOLIC PANEL
ALT: 15 U/L (ref 0–44)
AST: 22 U/L (ref 15–41)
Albumin: 3.6 g/dL (ref 3.5–5.0)
Alkaline Phosphatase: 62 U/L (ref 38–126)
Anion gap: 10 (ref 5–15)
BUN: 5 mg/dL — ABNORMAL LOW (ref 6–20)
CO2: 24 mmol/L (ref 22–32)
Calcium: 8.9 mg/dL (ref 8.9–10.3)
Chloride: 102 mmol/L (ref 98–111)
Creatinine, Ser: 0.67 mg/dL (ref 0.44–1.00)
GFR, Estimated: >60 mL/min (ref >60)
Glucose, Bld: 134 mg/dL — ABNORMAL HIGH (ref 70–99)
Potassium: 3.5 mmol/L (ref 3.5–5.1)
Sodium: 136 mmol/L (ref 135–145)
Total Bilirubin: 0.5 mg/dL (ref 0.3–1.2)
Total Protein: 7.7 g/dL (ref 6.5–8.1)

## 2022-01-03 LAB — URINALYSIS, ROUTINE W REFLEX MICROSCOPIC
Bilirubin Urine: NEGATIVE
Bilirubin Urine: NEGATIVE
Glucose, UA: NEGATIVE mg/dL
Glucose, UA: NEGATIVE mg/dL
Ketones, ur: 40 mg/dL — AB
Ketones, ur: >80 mg/dL — AB
Leukocytes,Ua: NEGATIVE
Leukocytes,Ua: NEGATIVE
Nitrite: NEGATIVE
Nitrite: NEGATIVE
Protein, ur: NEGATIVE mg/dL
Protein, ur: 30 mg/dL — AB
Specific Gravity, Urine: 1.02 (ref 1.005–1.030)
Specific Gravity, Urine: >1.03 — ABNORMAL HIGH (ref 1.005–1.030)
pH: 8 (ref 5.0–8.0)
pH: 6.5 (ref 5.0–8.0)

## 2022-01-03 LAB — CBC
HCT: 42 % (ref 36.0–46.0)
Hemoglobin: 13.9 g/dL (ref 12.0–15.0)
MCH: 25.9 pg — ABNORMAL LOW (ref 26.0–34.0)
MCHC: 33.1 g/dL (ref 30.0–36.0)
MCV: 78.4 fL — ABNORMAL LOW (ref 80.0–100.0)
Platelets: 387 10*3/uL (ref 150–400)
RBC: 5.36 MIL/uL — ABNORMAL HIGH (ref 3.87–5.11)
RDW: 15 % (ref 11.5–15.5)
WBC: 18.8 10*3/uL — ABNORMAL HIGH (ref 4.0–10.5)
nRBC: 0 % (ref 0.0–0.2)

## 2022-01-03 LAB — CBC/D/PLT+RPR+RH+ABO+RUBIGG...
Antibody Screen: NEGATIVE
Basophils Absolute: 0 10*3/uL (ref 0.0–0.2)
Basos: 0 %
EOS (ABSOLUTE): 0.3 10*3/uL (ref 0.0–0.4)
Eos: 3 %
HCV Ab: <0.1 s/co ratio (ref 0.0–0.9)
HIV Screen 4th Generation wRfx: NONREACTIVE
Hematocrit: 38.9 % (ref 34.0–46.6)
Hemoglobin: 12.9 g/dL (ref 11.1–15.9)
Hepatitis B Surface Ag: NEGATIVE
Immature Grans (Abs): 0 10*3/uL (ref 0.0–0.1)
Immature Granulocytes: 0 %
Lymphocytes Absolute: 1.9 10*3/uL (ref 0.7–3.1)
Lymphs: 15 %
MCH: 25.4 pg — ABNORMAL LOW (ref 26.6–33.0)
MCHC: 33.2 g/dL (ref 31.5–35.7)
MCV: 77 fL — ABNORMAL LOW (ref 79–97)
Monocytes Absolute: 0.8 10*3/uL (ref 0.1–0.9)
Monocytes: 6 %
Neutrophils Absolute: 9.2 10*3/uL — ABNORMAL HIGH (ref 1.4–7.0)
Neutrophils: 76 %
Platelets: 373 10*3/uL (ref 150–450)
RBC: 5.07 x10E6/uL (ref 3.77–5.28)
RDW: 14.5 % (ref 11.7–15.4)
RPR Ser Ql: NONREACTIVE
Rh Factor: POSITIVE
Rubella Antibodies, IGG: <0.9 index — ABNORMAL LOW (ref >0.99)
WBC: 12.2 10*3/uL — ABNORMAL HIGH (ref 3.4–10.8)

## 2022-01-03 LAB — PROTEIN / CREATININE RATIO, URINE
Creatinine, Urine: 43.26 mg/dL
Protein Creatinine Ratio: 0.49 mg/mg{Cre} — ABNORMAL HIGH (ref 0.00–0.15)
Total Protein, Urine: 21 mg/dL

## 2022-01-03 LAB — URINALYSIS, MICROSCOPIC (REFLEX): RBC / HPF: NONE SEEN RBC/hpf (ref 0–5)

## 2022-01-03 LAB — CERVICOVAGINAL ANCILLARY ONLY
Chlamydia: NEGATIVE
Comment: NEGATIVE
Comment: NORMAL
Neisseria Gonorrhea: NEGATIVE

## 2022-01-03 LAB — HCV INTERPRETATION

## 2022-01-03 MED ORDER — LABETALOL HCL 5 MG/ML IV SOLN
20.0000 mg | Freq: Once | INTRAVENOUS | Status: AC
Start: 2022-01-03 — End: 2022-01-03
  Administered 2022-01-03: 20 mg via INTRAVENOUS
  Filled 2022-01-03: qty 4

## 2022-01-03 MED ORDER — SODIUM CHLORIDE 0.9 % IV SOLN
25.0000 mg | Freq: Once | INTRAVENOUS | Status: AC
  Administered 2022-01-03: 25 mg via INTRAVENOUS
  Filled 2022-01-03: qty 1

## 2022-01-03 MED ORDER — LACTATED RINGERS IV SOLN: Freq: Once | INTRAVENOUS | Status: AC

## 2022-01-03 MED ORDER — M.V.I. ADULT IV INJ
Freq: Once | INTRAVENOUS | Status: DC
  Filled 2022-01-03: qty 10

## 2022-01-03 MED ORDER — FAMOTIDINE IN NACL 20-0.9 MG/50ML-% IV SOLN
20.0000 mg | Freq: Once | INTRAVENOUS | Status: AC
  Administered 2022-01-03: 20 mg via INTRAVENOUS
  Filled 2022-01-03: qty 50

## 2022-01-03 MED ORDER — LABETALOL HCL 100 MG PO TABS: 100.0000 mg | ORAL_TABLET | Freq: Two times a day (BID) | ORAL | 0 refills | Status: DC

## 2022-01-03 MED ORDER — LABETALOL HCL 5 MG/ML IV SOLN
20.0000 mg | Freq: Once | INTRAVENOUS | Status: AC
  Administered 2022-01-03: 20 mg via INTRAVENOUS
  Filled 2022-01-03: qty 4

## 2022-01-03 MED ORDER — DIPHENHYDRAMINE HCL 50 MG/ML IJ SOLN
25.0000 mg | Freq: Once | INTRAMUSCULAR | Status: AC
  Administered 2022-01-03: 25 mg via INTRAVENOUS
  Filled 2022-01-03: qty 1

## 2022-01-03 MED ORDER — HALOPERIDOL LACTATE 5 MG/ML IJ SOLN
2.0000 mg | Freq: Once | INTRAMUSCULAR | Status: AC
  Administered 2022-01-03: 2 mg via INTRAVENOUS
  Filled 2022-01-03: qty 0.4

## 2022-01-03 MED ORDER — FAMOTIDINE 10 MG PO TABS: 10.0000 mg | ORAL_TABLET | Freq: Two times a day (BID) | ORAL | 0 refills | Status: DC

## 2022-01-03 MED ORDER — LACTATED RINGERS IV BOLUS
1000.0000 mL | Freq: Once | INTRAVENOUS | Status: AC
  Administered 2022-01-04: 1000 mL via INTRAVENOUS

## 2022-01-03 MED ORDER — LABETALOL HCL 100 MG PO TABS
200.0000 mg | ORAL_TABLET | Freq: Once | ORAL | Status: AC
  Administered 2022-01-03: 200 mg via ORAL
  Filled 2022-01-03: qty 2

## 2022-01-03 NOTE — MAU Note (Signed)
RN in to start IV and pt does not have any visible veins for good iv access. IV team consult ordered

## 2022-01-03 NOTE — MAU Provider Note (Signed)
History     CSN: [id number]  Arrival date and time: 01/03/22 1927   None     Chief Complaint  Patient presents with   Emesis During Pregnancy   Frances Mccormick is a 36 y.o. G3P0020 at [redacted]w[redacted]d who receives care at [facility].  She presents  today for Emesis During Pregnancy.  She reports she was seen earlier today and had no issues prior to discharge.  She states she tried to take her promethazine, around 1400, but it only made her tired and when she woke up she remained sick.  She states she also tried to take her Pepcid and labetalol, but was unable to keep them down as well.  She states she attempted to eat broth and crackers around 1730, but also experienced vomiting.  Patient states prior to today she would only have 4-5 incidents of nausea and vomiting that resolved with medication.   OB History     Gravida  3   Para  0   Term      Preterm      AB  2   Living         SAB      IAB      Ectopic      Multiple      Live Births              Past Medical History:  Diagnosis Date   Crohn's colitis ([place])    Hypertension    Migraine    "a few/year" (07/27/2018)   Non-compliant patient     Past Surgical History:  Procedure Laterality Date   INDUCED ABORTION     NO PAST SURGERIES      Family History  Problem Relation Age of Onset   Hypertension Mother    Diabetes Mother    Heart disease Mother    Diabetes Father    Hypertension Maternal Grandmother    Stroke Maternal Grandmother    Hypertension Maternal Grandfather    Diabetes Maternal Grandfather    Breast cancer Maternal Aunt     Social History   Tobacco Use   Smoking status: Never   Smokeless tobacco: Never  Vaping Use   Vaping Use: Never used  Substance Use Topics   Alcohol use: Not Currently    Alcohol/week: 2.0 standard drinks    Types: 1 Glasses of wine, 1 Cans of beer per week    Comment: 1 x per week, prior to pregnancy   Drug use: Not Currently    Types: Marijuana    Comment:  occaisonally, not since confirmed pregnancy    Allergies:  Allergies  Allergen Reactions   Bee Venom Anaphylaxis   Ciprofloxacin Anaphylaxis    Medications Prior to Admission  Medication Sig Dispense Refill Last Dose   famotidine (PEPCID) 10 MG tablet Take 1 tablet (10 mg total) by mouth 2 (two) times daily. 60 tablet 0    labetalol (NORMODYNE) 100 MG tablet Take 1 tablet (100 mg total) by mouth 2 (two) times daily. 60 tablet 0    Prenat-FeFum-DSS-FA-DHA w/o A (PNV-DHA+DOCUSATE) 27-1.25-300 MG CAPS Take 1 capsule by mouth daily before breakfast. 90 capsule 4    promethazine (PHENERGAN) 25 MG tablet Take 1 tablet (25 mg total) by mouth every 6 (six) hours as needed for nausea or vomiting. 30 tablet 0     Review of Systems  Gastrointestinal:  Positive for nausea and vomiting. Negative for abdominal pain.  Genitourinary:  Negative for difficulty urinating, dysuria, vaginal  bleeding and vaginal discharge.  Neurological:  Negative for dizziness, light-headedness and headaches.  Physical Exam   Blood pressure (!) 183/108, pulse 93, temperature 98.7 F (37.1 C), resp. rate 18, height 5' (1.524 m), weight 93 kg, last menstrual period 10/14/2021, SpO2 100 %, unknown if currently breastfeeding. Vitals:   01/03/22 1945 01/03/22 1952 01/03/22 2057 01/03/22 2230  BP: (!) 176/106 (!) 183/108 (!) 156/65 (!) 161/90  Pulse: 93   91  Resp:      Temp:      SpO2: 100%     Weight:      Height:        Physical Exam Vitals reviewed. Exam conducted with a chaperone present.  Constitutional:      Appearance: Normal appearance.  HENT:     Head: Normocephalic and atraumatic.  Eyes:     Conjunctiva/sclera: Conjunctivae normal.  Cardiovascular:     Rate and Rhythm: Normal rate and regular rhythm.     Heart sounds: Normal heart sounds.  Pulmonary:     Effort: Pulmonary effort is normal. No respiratory distress.     Breath sounds: Normal breath sounds.  Abdominal:     General: Bowel sounds are  normal.  Musculoskeletal:        General: Normal range of motion.     Cervical back: Normal range of motion.  Skin:    General: Skin is warm and dry.  Neurological:     Mental Status: She is alert and oriented to person, place, and time.  Psychiatric:        Mood and Affect: Mood normal.        Behavior: Behavior normal.        Thought Content: Thought content normal.    MAU Course  Procedures Results for orders placed or performed during the hospital encounter of 01/03/22 (from the past 24 hour(s))  Urinalysis, Routine w reflex microscopic Urine, Clean Catch     Status: Abnormal   Collection Time: 01/03/22  7:50 PM  Result Value Ref Range   Color, Urine YELLOW YELLOW   APPearance CLOUDY (A) CLEAR   Specific Gravity, Urine >1.030 (H) 1.005 - 1.030   pH 6.5 5.0 - 8.0   Glucose, UA NEGATIVE NEGATIVE mg/dL   Hgb urine dipstick SMALL (A) NEGATIVE   Bilirubin Urine NEGATIVE NEGATIVE   Ketones, ur >80 (A) NEGATIVE mg/dL   Protein, ur 30 (A) NEGATIVE mg/dL   Nitrite NEGATIVE NEGATIVE   Leukocytes,Ua NEGATIVE NEGATIVE  Urinalysis, Microscopic (reflex)     Status: Abnormal   Collection Time: 01/03/22  7:50 PM  Result Value Ref Range   RBC / HPF 11-20 0 - 5 RBC/hpf   WBC, UA 11-20 0 - 5 WBC/hpf   Bacteria, UA FEW (A) NONE SEEN   Squamous Epithelial / LPF 6-10 0 - 5   Mucus PRESENT    Hyaline Casts, UA PRESENT     MDM Start IV LR Bolus x2 Antiemetic x2 PPI Medication Modification Assessment and Plan  36 year old, G3P0020  SIUP at 11.4 weeks Vomiting CHTN  -Reviewed POC with patient. -Exam performed.  -Will give Phenergan LR bolus f/b MVI. -Will also consider pepcid if heartburn continues after initial infusion. -Will also give labetalol 20mg  IV for elevated bps.  -Nurse report difficulty with insertion of IV, so IV team order placed. -Pharmacy contacts provider and reports all medications not on formulary for MVI.  Instructed to discontinue. -Patient notified of  change in POC. -Will await IV team.  Maryann Conners 01/03/2022, 7:59 PM   Reassessment (10:43 PM)  -Patient resting in bed, reports fatigue with phenergan dosing. -States she had vomiting x 3 prior to insertion of IV. -Reports continued heartburn. -Reviewed UA findings. Will give LR and pepcid. -No questions.   Reassessment (12:28 AM) -Patient reports some improvement, but then with vomiting while provider at bedside.  -Will give scop patch and Zofran and continue to monitor.  Reassessment (1:44 AM)  -Patient reports improvement and able to sleep. -Discussed management at home with phenergan and Zofran. -Informed that phenergan can be placed in cheek, vagina, or rectum if unable to take orally. -Also informed that Zofran ODT dissolves on contact so is usually effective despite emesis.  -Reviewed blood pressure medication and informed that she should be taking 200mg  BID, but was only ordered 100mg .  Instructed to increase dosing to two pills twice daily.  Provider sent in modified script with 200mg  and she can pick up and take upon completion of 100mg  tablets.  Patient and mother verbalizes understanding.  -Message sent to [facility] for patient to have blood pressure check on Monday or Tuesday per availability. -Patient questions costs of Zofran as insurance has limited coverage.  -Instructed to send provider mychart message if unable to obtain prescription d/t cost and will modify accordingly.  Further instructed to attempt to use good rx if necessary to reduce costs.  -No other questions. -Encouraged to call primary office or return to MAU if symptoms worsen or with the onset of new symptoms. -Discharged to home in improved condition.  Maryann Conners MSN, CNM Advanced Practice Provider, [facility]

## 2022-01-03 NOTE — MAU Note (Signed)
Had been doing ok, woke up at 0300 vomiting, just won't stop.  Little streaks of blood in emesis.  Cramping in lower abd. No fever or diarrhea.

## 2022-01-03 NOTE — Discharge Instructions (Signed)
Vomiting in First Trimester Follow these instructions at home: To help relieve your symptoms, listen to your body. Everyone is different and has different preferences. Find what works best for you. Here are some things you can try to help relieve your symptoms: Meals and snacks Eat 5-6 small meals daily instead of 3 large meals. Eating small meals and snacks can help you avoid an empty stomach. Before getting out of bed, eat a couple of crackers to avoid moving around on an empty stomach. Eat a protein-rich snack before bed. Examples include cheese and crackers, or a peanut butter sandwich made with 1 slice of whole-wheat bread and 1 tsp (5 g) of peanut butter. Eat and drink slowly. Try eating starchy foods as these are usually tolerated well. Examples include cereal, toast, bread, potatoes, pasta, rice, and pretzels. Eat at least one serving of protein with your meals and snacks. Protein options include lean meats, poultry, seafood, beans, nuts, nut butters, eggs, cheese, and yogurt. Eat or suck on things that have ginger in them. It may help to relieve nausea. Add  tsp (0.44 g) ground ginger to hot tea, or choose ginger tea.   Fluids It is important to stay hydrated. Try to: Drink small amounts of fluids often. Drink fluids 30 minutes before or after a meal to help lessen the feeling of a full stomach. Drink 100% fruit juice or an electrolyte drink. An electrolyte drink contains sodium, potassium, and chloride. Drink fluids that are cold, clear, and carbonated or sour. These include lemonade, ginger ale, lemon-lime soda, ice water, and sparkling water. Things to avoid Avoid the following: Eating foods that trigger your symptoms. These may include spicy foods, coffee, high-fat foods, very sweet foods, and acidic foods. Drinking more than 1 cup of fluid at a time. Skipping meals. Nausea can be more intense on an empty stomach. If you cannot tolerate food, do not force it. Try sucking on ice  chips or other frozen items and make up for missed calories later. Lying down within 2 hours after eating. Being exposed to environmental triggers. These may include food smells, smoky rooms, closed spaces, rooms with strong smells, warm or humid places, overly loud and noisy rooms, and rooms with motion or flickering lights. Try eating meals in a well-ventilated area that is free of strong smells. Making quick and sudden changes in your movement. Taking iron pills and multivitamins that contain iron. If you take prescription iron pills, do not stop taking them unless your health care provider approves. Preparing food. The smell of food can spoil your appetite or trigger nausea. General instructions Brush your teeth or use a mouth rinse after meals. Take over-the-counter and prescription medicines only as told by your health care provider. Follow instructions from your health care provider about eating or drinking restrictions. Talk with your health care provider about starting a supplement of vitamin B6. Continue to take your prenatal vitamins as told by your health care provider. If you are having trouble taking your prenatal vitamins, talk with your health care provider about other options. Keep all follow-up visits. This is important. Follow-up visits include prenatal visits. Contact a health care provider if: You have pain in your abdomen. You have a severe headache. You have vision problems. You are losing weight. You feel weak or dizzy. You cannot eat or drink without vomiting, especially if this goes on for a full day. Get help right away if: You cannot drink fluids without vomiting. You vomit blood. You have constant   nausea and vomiting. You are very weak. You faint. You have a fever and your symptoms suddenly get worse. Summary Making some changes to your eating habits may help relieve nausea and vomiting. This condition may be managed with lifestyle changes and medicines as  prescribed by your health care provider. If medicines do not help relieve nausea and vomiting, you may need to receive fluids through an IV at the hospital. This information is not intended to replace advice given to you by your health care provider. Make sure you discuss any questions you have with your health care provider. Document Revised: 06/11/2020 Document Reviewed: 06/11/2020 Elsevier Patient Education  2021 Elsevier Inc.  

## 2022-01-03 NOTE — MAU Provider Note (Addendum)
History     CSN: [id number]  Arrival date and time: 01/03/22 0840   Event Date/Time   First Provider Initiated Contact with Patient 01/03/22 [phone]      Chief Complaint  Patient presents with   Emesis   Frances Mccormick is a 36yo G3P0 currently pregnant at [redacted]w[redacted]d who presented with nausea, vomiting, and abdominal pain and has elevated blood pressure. The vomiting began around 0300, and patient says it has not stopped. She reported there were small streaks of blood in the emesis. Reports most recent marijuana usage was 2 weeks ago. Has a history of recurrent severe vomiting outside of pregnancy.  OB History     Gravida  3   Para  0   Term      Preterm      AB  2   Living         SAB      IAB      Ectopic      Multiple      Live Births              Past Medical History:  Diagnosis Date   Crohn's colitis ([place])    Migraine    "a few/year" (07/27/2018)   Non-compliant patient     Past Surgical History:  Procedure Laterality Date   INDUCED ABORTION     NO PAST SURGERIES      Family History  Problem Relation Age of Onset   Hypertension Mother    Diabetes Mother    Heart disease Mother    Diabetes Father    Hypertension Maternal Grandmother    Stroke Maternal Grandmother    Hypertension Maternal Grandfather    Diabetes Maternal Grandfather    Breast cancer Maternal Aunt     Social History   Tobacco Use   Smoking status: Never   Smokeless tobacco: Never  Vaping Use   Vaping Use: Never used  Substance Use Topics   Alcohol use: Not Currently    Alcohol/week: 2.0 standard drinks    Types: 1 Glasses of wine, 1 Cans of beer per week    Comment: 1 x per week, prior to pregnancy   Drug use: Not Currently    Types: Marijuana    Comment: occaisonally, not since confirmed pregnancy    Allergies:  Allergies  Allergen Reactions   Bee Venom Anaphylaxis   Ciprofloxacin Anaphylaxis    Medications Prior to Admission  Medication Sig Dispense Refill  Last Dose   Prenat-FeFum-DSS-FA-DHA w/o A (PNV-DHA+DOCUSATE) 27-1.25-300 MG CAPS Take 1 capsule by mouth daily before breakfast. 90 capsule 4 01/02/2022 at 1200   promethazine (PHENERGAN) 25 MG tablet Take 1 tablet (25 mg total) by mouth every 6 (six) hours as needed for nausea or vomiting. 30 tablet 0 01/02/2022 at 1900    Review of Systems  Constitutional:  Negative for fever.  Eyes:  Negative for visual disturbance.  Respiratory:  Negative for shortness of breath.   Cardiovascular:  Negative for leg swelling.  Gastrointestinal:  Positive for vomiting.  Genitourinary:  Negative for difficulty urinating, dysuria and urgency.   Physical Exam   Blood pressure (!) 159/95, pulse 68, temperature 98.4 F (36.9 C), temperature source Oral, resp. rate 18, height 5' (1.524 m), weight 93.7 kg, last menstrual period 10/14/2021, SpO2 100 %, unknown if currently breastfeeding.  Physical Exam Vitals and nursing note reviewed.  Constitutional:      Appearance: Normal appearance. She is ill-appearing.  HENT:     Head:  Normocephalic and atraumatic.  Eyes:     General: No scleral icterus. Cardiovascular:     Rate and Rhythm: Normal rate and regular rhythm.     Comments: DP and TP pulses palpable bilaterally Pulmonary:     Effort: Pulmonary effort is normal. No respiratory distress.     Breath sounds: Normal breath sounds. No wheezing or rhonchi.  Abdominal:     Comments: Lower right abdominal tenderness  Skin:    General: Skin is warm.  Neurological:     Mental Status: She is alert.    MAU Course   MDM Patient presented with nausea and vomiting causing abdominal pain. Believed elevated BP was due to vomiting. Initial recorded blood pressure reading was 153/99. LR fluids and IV Labetalol 20mg  given. Also given Benadryl and Haldol for nausea and vomiting. Pain decreased and became drowsy. CBC and CMP ordered STAT. CMP showed elevated glucose, but otherwise normal. Received first dose of 200mg   Labetalol PO. Tolerated well, and education given.  Assessment and Plan  Frances Mccormick is a 36yo G3P0 currently pregnant at [redacted]w[redacted]d who presented with nausea and vomiting and has elevated blood pressure currently in stable condition.  Emesis - Phenergan 25mg  q6h PRN - Start Pepcid 10mg  BID - Counseled about marijuana cessation to reduce future occurrences  Chronic Hypertension - Labetalol 100mg  BID - Continue to monitor BP  11 weeks of pregnancy - Routine prenatal care - Continue prenatal vitamins - Call [facility] on 2/6 to schedule follow-up   Danie Chandler, [place] 01/03/2022, 9:41 AM   CNM attestation: Attestation of Supervision of Student:  I confirm that I have verified the information documented in the medical students note and that I have also personally reperformed the history, physical exam and all medical decision making activities.  I have verified that all services and findings are accurately documented in this student's note; and I agree with management and plan as outlined in the documentation. I have also made any necessary editorial changes.   Frances Mccormick is a 36 y.o. G3P0020 reporting right mid abdominal pain and severe recurrent vomiting 2/2 THC use 2-3 weeks ago. Denies VB, cramping, urinary symptoms, vaginal itching/burning.  PE: BP (!) 159/93 (BP Location: Right Arm)    Pulse 86    Temp 98.3 F (36.8 C) (Oral)    Resp 20    Ht 5' (1.524 m)    Wt 93.7 kg    LMP 10/14/2021 (Exact Date)    SpO2 100%    BMI 40.33 kg/m  Gen: calm comfortable, NAD Resp: normal effort, no distress Abd: soft, nontender  ROS, labs, PMH reviewed  Results for orders placed or performed during the hospital encounter of 01/03/22 (from the past 24 hour(s))  Urinalysis, Routine w reflex microscopic Urine, Clean Catch     Status: Abnormal   Collection Time: 01/03/22  9:19 AM  Result Value Ref Range   Color, Urine YELLOW YELLOW   APPearance CLOUDY (A) CLEAR   Specific Gravity, Urine  1.020 1.005 - 1.030   pH 8.0 5.0 - 8.0   Glucose, UA NEGATIVE NEGATIVE mg/dL   Hgb urine dipstick TRACE (A) NEGATIVE   Bilirubin Urine NEGATIVE NEGATIVE   Ketones, ur 40 (A) NEGATIVE mg/dL   Protein, ur NEGATIVE NEGATIVE mg/dL   Nitrite NEGATIVE NEGATIVE   Leukocytes,Ua NEGATIVE NEGATIVE  Protein / creatinine ratio, urine     Status: Abnormal   Collection Time: 01/03/22  9:19 AM  Result Value Ref Range   Creatinine, Urine  43.26 mg/dL   Total Protein, Urine 21 mg/dL   Protein Creatinine Ratio 0.49 (H) 0.00 - 0.15 mg/mg[Cre]  Urinalysis, Microscopic (reflex)     Status: Abnormal   Collection Time: 01/03/22  9:19 AM  Result Value Ref Range   RBC / HPF NONE SEEN 0 - 5 RBC/hpf   WBC, UA 0-5 0 - 5 WBC/hpf   Bacteria, UA RARE (A) NONE SEEN   Squamous Epithelial / LPF 6-10 0 - 5   Amorphous Crystal PRESENT   CBC     Status: Abnormal   Collection Time: 01/03/22  9:53 AM  Result Value Ref Range   WBC 18.8 (H) 4.0 - 10.5 K/uL   RBC 5.36 (H) 3.87 - 5.11 MIL/uL   Hemoglobin 13.9 12.0 - 15.0 g/dL   HCT 42.0 36.0 - 46.0 %   MCV 78.4 (L) 80.0 - 100.0 fL   MCH 25.9 (L) 26.0 - 34.0 pg   MCHC 33.1 30.0 - 36.0 g/dL   RDW 15.0 11.5 - 15.5 %   Platelets 387 150 - 400 K/uL   nRBC 0.0 0.0 - 0.2 %  Comprehensive metabolic panel     Status: Abnormal   Collection Time: 01/03/22  9:53 AM  Result Value Ref Range   Sodium 136 135 - 145 mmol/L   Potassium 3.5 3.5 - 5.1 mmol/L   Chloride 102 98 - 111 mmol/L   CO2 24 22 - 32 mmol/L   Glucose, Bld 134 (H) 70 - 99 mg/dL   BUN 5 (L) 6 - 20 mg/dL   Creatinine, Ser 0.67 0.44 - 1.00 mg/dL   Calcium 8.9 8.9 - 10.3 mg/dL   Total Protein 7.7 6.5 - 8.1 g/dL   Albumin 3.6 3.5 - 5.0 g/dL   AST 22 15 - 41 U/L   ALT 15 0 - 44 U/L   Alkaline Phosphatase 62 38 - 126 U/L   Total Bilirubin 0.5 0.3 - 1.2 mg/dL   GFR, Estimated >60 >60 mL/min   Anion gap 10 5 - 15   Meds ordered this encounter  Medications   labetalol (NORMODYNE) injection 20 mg   haloperidol  lactate (HALDOL) injection 2 mg   diphenhydrAMINE (BENADRYL) injection 25 mg   lactated ringers infusion   labetalol (NORMODYNE) tablet 200 mg   famotidine (PEPCID) 10 MG tablet    Sig: Take 1 tablet (10 mg total) by mouth 2 (two) times daily.    Dispense:  60 tablet    Refill:  0    Order Specific Question:   Supervising Provider    Answer:   Lynnda Shields A [[id number]]   labetalol (NORMODYNE) 100 MG tablet    Sig: Take 1 tablet (100 mg total) by mouth 2 (two) times daily.    Dispense:  60 tablet    Refill:  0    Order Specific Question:   Supervising Provider    Answer:   Griffin Basil [[id number]]    Plan: --36 y.o. G3P0020 at [redacted]w[redacted]d  --FHT 165 by Doppler --CHTN: initiate Labetalol 200 mg BID --Cannabis Hyperemesis, continue previously prescribed Phenergan PRN, refills added --THC abstinence encouraged, warned about recurrence of symptoms with use --All complaints resolved with treatments given in MAU --Tolerating PO prior to discharge --Pain score 0/10 prior to discharge --Discharge home in stable condition  F/U: Message sent to [facility] to coordinate BP check Monday or Tuesday  Darlina Rumpf, [place] 01/03/22 4:16 PM

## 2022-01-03 NOTE — MAU Note (Signed)
Pt was seen in MAU earlier today for n/v and received IVFs. Left about 1300. Has not stopped vomiting since being home and emesis now is brownish and has some blood in it. Has not taken b/p med because cannot keep it down.

## 2022-01-04 DIAGNOSIS — Z3A11 11 weeks gestation of pregnancy: Secondary | ICD-10-CM

## 2022-01-04 DIAGNOSIS — O10919 Unspecified pre-existing hypertension complicating pregnancy, unspecified trimester: Secondary | ICD-10-CM

## 2022-01-04 DIAGNOSIS — O219 Vomiting of pregnancy, unspecified: Secondary | ICD-10-CM

## 2022-01-04 LAB — CULTURE, OB URINE

## 2022-01-04 MED ORDER — ONDANSETRON 4 MG PO TBDP: 4.0000 mg | ORAL_TABLET | Freq: Four times a day (QID) | ORAL | 1 refills | Status: DC | PRN

## 2022-01-04 MED ORDER — LABETALOL HCL 200 MG PO TABS
200.0000 mg | ORAL_TABLET | Freq: Two times a day (BID) | ORAL | 2 refills | Status: DC
Start: 2022-01-04 — End: 2022-01-08

## 2022-01-04 MED ORDER — SCOPOLAMINE 1 MG/3DAYS TD PT72
1.0000 | MEDICATED_PATCH | TRANSDERMAL | Status: DC
  Administered 2022-01-04: 1.5 mg via TRANSDERMAL
  Filled 2022-01-04: qty 1

## 2022-01-04 MED ORDER — SODIUM CHLORIDE 0.9 % IV SOLN
8.0000 mg | Freq: Once | INTRAVENOUS | Status: AC
  Administered 2022-01-04: 8 mg via INTRAVENOUS
  Filled 2022-01-04: qty 4

## 2022-01-04 NOTE — Progress Notes (Signed)
Written and verbal d/c instructions given and understanding voiced. 

## 2022-01-05 ENCOUNTER — Other Ambulatory Visit: Payer: Self-pay

## 2022-01-05 ENCOUNTER — Inpatient Hospital Stay (HOSPITAL_COMMUNITY)
Admission: AD | Admit: 2022-01-05 | Discharge: 2022-01-05 | Disposition: A | Discharge status: Home / Self Care | Payer: 59 | Attending: Obstetrics and Gynecology | Admitting: Obstetrics and Gynecology

## 2022-01-05 ENCOUNTER — Encounter (HOSPITAL_COMMUNITY): Payer: Self-pay | Admitting: Obstetrics and Gynecology

## 2022-01-05 DIAGNOSIS — O210 Mild hyperemesis gravidarum: Secondary | ICD-10-CM

## 2022-01-05 DIAGNOSIS — O26891 Other specified pregnancy related conditions, first trimester: Secondary | ICD-10-CM | POA: Diagnosis present

## 2022-01-05 DIAGNOSIS — Z3A11 11 weeks gestation of pregnancy: Secondary | ICD-10-CM | POA: Insufficient documentation

## 2022-01-05 DIAGNOSIS — E860 Dehydration: Secondary | ICD-10-CM | POA: Diagnosis not present

## 2022-01-05 DIAGNOSIS — O219 Vomiting of pregnancy, unspecified: Secondary | ICD-10-CM | POA: Diagnosis not present

## 2022-01-05 DIAGNOSIS — O99281 Endocrine, nutritional and metabolic diseases complicating pregnancy, first trimester: Secondary | ICD-10-CM | POA: Insufficient documentation

## 2022-01-05 DIAGNOSIS — R109 Unspecified abdominal pain: Secondary | ICD-10-CM | POA: Insufficient documentation

## 2022-01-05 DIAGNOSIS — O10919 Unspecified pre-existing hypertension complicating pregnancy, unspecified trimester: Secondary | ICD-10-CM | POA: Diagnosis not present

## 2022-01-05 DIAGNOSIS — Z3491 Encounter for supervision of normal pregnancy, unspecified, first trimester: Secondary | ICD-10-CM

## 2022-01-05 LAB — URINALYSIS, ROUTINE W REFLEX MICROSCOPIC
Bilirubin Urine: NEGATIVE
Glucose, UA: NEGATIVE mg/dL
Ketones, ur: >80 mg/dL — AB
Leukocytes,Ua: NEGATIVE
Nitrite: NEGATIVE
Protein, ur: NEGATIVE mg/dL
Specific Gravity, Urine: 1.02 (ref 1.005–1.030)
pH: 6.5 (ref 5.0–8.0)

## 2022-01-05 LAB — URINALYSIS, MICROSCOPIC (REFLEX)

## 2022-01-05 MED ORDER — ACETAMINOPHEN 500 MG PO TABS
1000.0000 mg | ORAL_TABLET | Freq: Once | ORAL | Status: AC
  Administered 2022-01-05: 1000 mg via ORAL
  Filled 2022-01-05: qty 2

## 2022-01-05 MED ORDER — LACTATED RINGERS IV BOLUS
1000.0000 mL | Freq: Once | INTRAVENOUS | Status: AC
Start: 2022-01-05 — End: 2022-01-05
  Administered 2022-01-05: 1000 mL via INTRAVENOUS

## 2022-01-05 MED ORDER — SCOPOLAMINE 1 MG/3DAYS TD PT72: 1.0000 | MEDICATED_PATCH | TRANSDERMAL | Status: DC

## 2022-01-05 MED ORDER — FAMOTIDINE IN NACL 20-0.9 MG/50ML-% IV SOLN
20.0000 mg | Freq: Once | INTRAVENOUS | Status: AC
  Administered 2022-01-05: 20 mg via INTRAVENOUS
  Filled 2022-01-05: qty 50

## 2022-01-05 MED ORDER — PROCHLORPERAZINE EDISYLATE 10 MG/2ML IJ SOLN
10.0000 mg | Freq: Once | INTRAMUSCULAR | Status: AC
  Administered 2022-01-05: 10 mg via INTRAVENOUS
  Filled 2022-01-05: qty 2

## 2022-01-05 MED ORDER — ONDANSETRON HCL 4 MG/2ML IJ SOLN
4.0000 mg | Freq: Once | INTRAMUSCULAR | Status: AC
  Administered 2022-01-05: 4 mg via INTRAVENOUS
  Filled 2022-01-05: qty 2

## 2022-01-05 MED ORDER — PROMETHAZINE HCL 25 MG PO TABS: 25.0000 mg | ORAL_TABLET | Freq: Four times a day (QID) | ORAL | 0 refills | Status: DC | PRN

## 2022-01-05 MED ORDER — LABETALOL HCL 100 MG PO TABS
200.0000 mg | ORAL_TABLET | Freq: Once | ORAL | Status: AC
  Administered 2022-01-05: 200 mg via ORAL
  Filled 2022-01-05: qty 2

## 2022-01-05 MED ORDER — SCOPOLAMINE 1 MG/3DAYS TD PT72: 1.0000 | MEDICATED_PATCH | TRANSDERMAL | 1 refills | Status: DC

## 2022-01-05 MED ORDER — M.V.I. ADULT IV INJ
Freq: Once | INTRAVENOUS | Status: AC
  Filled 2022-01-05: qty 10

## 2022-01-05 NOTE — MAU Note (Signed)
Frances Mccormick is a 36 y.o. at [redacted]w[redacted]d here in MAU reporting: ongoing nausea and vomiting. States was okay yesterday and then this morning it got bad again. Last dose of zofran was around 1300, last dose of phenergan was last night. Has not been trying to insert phenergan rectally or vaginally due to cramping. Is not taking pepcid. Is currently cramping. No bleeding or discharge.   Onset of complaint: ongoing  Pain score: 8/10  Vitals:   01/05/22 1749  BP: (!) 169/103  Pulse: 89  Resp: 18  Temp: 99.1 F (37.3 C)  SpO2: 100%     Lab orders placed from triage: UA

## 2022-01-05 NOTE — BH Specialist Note (Signed)
[facility]ated Behavioral [facility] Initial In-Person Visit  MRN: [id number] Name: Frances Mccormick  Number of [place] Clinician visits:: 1/6 Session Start time: 900am  Session End time: 915am Total time: 15 minutes in person   Types of Service: [place] ([facility])  Interpretor:No. Interpretor Name and Language: none   Warm Hand Off Completed.       Completed New Ob introduction with [name]. [name] reports history with Crohn disease. [name] expressed full understanding of services and offerings with [facility]. [name] reports all questions and concerns were answered.  Lynnea Ferrier, LCSW

## 2022-01-05 NOTE — MAU Provider Note (Addendum)
History     CSN: [id number]  Arrival date and time: 01/05/22 1733   Event Date/Time   First Provider Initiated Contact with Patient 01/05/22 1809      Chief Complaint  Patient presents with   Abdominal Pain   Nausea   HPI Frances Mccormick is a 36 y.o. G3P0020 at [redacted]w[redacted]d who presents with nausea, vomiting and abdominal pain. She states after she was discharged on 2/3 she was able to keep her medications down and was feeling better. She reports she woke up this morning vomiting and was unable to take any of her medications. She was not able to use the medications as suppositories because of the cramping. She rates the pain a 5/10 and has not tried anything for the pain. She reports she feels the pain after vomiting. She denies any bleeding or leaking. She has not taken any labetalol. She denies any chest pain, shortness of breath or headache.  OB History     Gravida  3   Para  0   Term      Preterm      AB  2   Living         SAB      IAB      Ectopic      Multiple      Live Births              Past Medical History:  Diagnosis Date   Crohn's colitis ([place])    Hypertension    Migraine    "a few/year" (07/27/2018)   Non-compliant patient     Past Surgical History:  Procedure Laterality Date   INDUCED ABORTION     NO PAST SURGERIES      Family History  Problem Relation Age of Onset   Hypertension Mother    Diabetes Mother    Heart disease Mother    Diabetes Father    Hypertension Maternal Grandmother    Stroke Maternal Grandmother    Hypertension Maternal Grandfather    Diabetes Maternal Grandfather    Breast cancer Maternal Aunt     Social History   Tobacco Use   Smoking status: Never   Smokeless tobacco: Never  Vaping Use   Vaping Use: Never used  Substance Use Topics   Alcohol use: Not Currently    Alcohol/week: 2.0 standard drinks    Types: 1 Glasses of wine, 1 Cans of beer per week    Comment: 1 x per week, prior to pregnancy   Drug  use: Not Currently    Types: Marijuana    Comment: occaisonally, not since confirmed pregnancy    Allergies:  Allergies  Allergen Reactions   Bee Venom Anaphylaxis   Ciprofloxacin Anaphylaxis    Medications Prior to Admission  Medication Sig Dispense Refill Last Dose   famotidine (PEPCID) 10 MG tablet Take 1 tablet (10 mg total) by mouth 2 (two) times daily. 60 tablet 0    labetalol (NORMODYNE) 200 MG tablet Take 1 tablet (200 mg total) by mouth 2 (two) times daily. 60 tablet 2    ondansetron (ZOFRAN-ODT) 4 MG disintegrating tablet Take 1-2 tablets (4-8 mg total) by mouth every 6 (six) hours as needed for nausea or vomiting. 45 tablet 1    Prenat-FeFum-DSS-FA-DHA w/o A (PNV-DHA+DOCUSATE) 27-1.25-300 MG CAPS Take 1 capsule by mouth daily before breakfast. 90 capsule 4    promethazine (PHENERGAN) 25 MG tablet Take 1 tablet (25 mg total) by mouth every 6 (six) hours as  needed for nausea or vomiting. 30 tablet 0     Review of Systems  Constitutional: Negative.  Negative for fatigue and fever.  HENT: Negative.    Respiratory: Negative.  Negative for shortness of breath.   Cardiovascular: Negative.  Negative for chest pain.  Gastrointestinal:  Positive for abdominal pain, nausea and vomiting. Negative for constipation and diarrhea.  Genitourinary: Negative.  Negative for dysuria, vaginal bleeding and vaginal discharge.  Neurological: Negative.  Negative for dizziness and headaches.  Physical Exam   Blood pressure (!) 169/103, pulse 89, temperature 99.1 F (37.3 C), temperature source Oral, resp. rate 18, height 5' (1.524 m), weight 90.1 kg, last menstrual period 10/14/2021, SpO2 100 %, unknown if currently breastfeeding. Patient Vitals for the past 24 hrs:  BP Temp Temp src Pulse Resp SpO2 Height Weight  01/05/22 2010 (!) 148/85 -- -- 93 -- -- -- --  01/05/22 1849 -- -- -- -- -- 99 % -- --  01/05/22 1845 (!) 169/101 -- -- 86 -- -- -- --  01/05/22 1844 -- -- -- -- -- 100 % -- --   01/05/22 1749 (!) 169/103 99.1 F (37.3 C) Oral 89 18 100 % -- --  01/05/22 1745 -- -- -- -- -- -- 5' (1.524 m) 90.1 kg    Physical Exam Vitals and nursing note reviewed.  Constitutional:      General: She is not in acute distress.    Appearance: She is well-developed.  HENT:     Head: Normocephalic.  Eyes:     Pupils: Pupils are equal, round, and reactive to light.  Cardiovascular:     Rate and Rhythm: Normal rate and regular rhythm.     Heart sounds: Normal heart sounds.  Pulmonary:     Effort: Pulmonary effort is normal. No respiratory distress.     Breath sounds: Normal breath sounds.  Abdominal:     General: Bowel sounds are normal. There is no distension.     Palpations: Abdomen is soft.     Tenderness: There is no abdominal tenderness.  Skin:    General: Skin is warm and dry.  Neurological:     Mental Status: She is alert and oriented to person, place, and time.  Psychiatric:        Mood and Affect: Mood normal.        Behavior: Behavior normal.        Thought Content: Thought content normal.        Judgment: Judgment normal.    MAU Course  Procedures Results for orders placed or performed during the hospital encounter of 01/05/22 (from the past 24 hour(s))  Urinalysis, Routine w reflex microscopic Urine, Clean Catch     Status: Abnormal   Collection Time: 01/05/22  6:04 PM  Result Value Ref Range   Color, Urine YELLOW YELLOW   APPearance CLOUDY (A) CLEAR   Specific Gravity, Urine 1.020 1.005 - 1.030   pH 6.5 5.0 - 8.0   Glucose, UA NEGATIVE NEGATIVE mg/dL   Hgb urine dipstick TRACE (A) NEGATIVE   Bilirubin Urine NEGATIVE NEGATIVE   Ketones, ur >80 (A) NEGATIVE mg/dL   Protein, ur NEGATIVE NEGATIVE mg/dL   Nitrite NEGATIVE NEGATIVE   Leukocytes,Ua NEGATIVE NEGATIVE  Urinalysis, Microscopic (reflex)     Status: Abnormal   Collection Time: 01/05/22  6:04 PM  Result Value Ref Range   RBC / HPF 6-10 0 - 5 RBC/hpf   WBC, UA 0-5 0 - 5 WBC/hpf   Bacteria, UA  RARE (A) NONE SEEN   Squamous Epithelial / LPF 0-5 0 - 5   Mucus PRESENT    Amorphous Crystal PRESENT      MDM UA LR bolus Pepcid Zofran Compazine Scop patch  Tylenol PO Labetalol PO  Patient has lost 6lbs since visit on 2/3 but is not taking medication as prescribed. Discussed importance of keeping on a schedule even when she is feeling better and taking medications around the clock, even at night.   Pt informed that the ultrasound is considered a limited OB ultrasound and is not intended to be a complete ultrasound exam.  Patient also informed that the ultrasound is not being completed with the intent of assessing for fetal or placental anomalies or any pelvic abnormalities.  Explained that the purpose of todays ultrasound is to assess for  viability.  Patient acknowledges the purpose of the exam and the limitations of the study.    Active fetus with FHR of 146 bpm  Multivitamin LR bolus  Care turned over to M. Kendyn Zaman CNM at [address]  01/05/22  2200: Feels much better. Cramping resolved. No further emesis. Tolerating po. Stable for discharge. Assessment and Plan   1. Encounter for supervision of normal pregnancy in first trimester, unspecified gravidity   2. [redacted] weeks gestation of pregnancy   3. Morning sickness   4. Dehydration    Discharge home Follow up at [facility] as scheduled Rx Scopolamine Phenergan refill Return precautions  Allergies as of 01/05/2022       Reactions   Bee Venom Anaphylaxis   Ciprofloxacin Anaphylaxis        Medication List     TAKE these medications    famotidine 10 MG tablet Commonly known as: PEPCID Take 1 tablet (10 mg total) by mouth 2 (two) times daily.   labetalol 200 MG tablet Commonly known as: NORMODYNE Take 1 tablet (200 mg total) by mouth 2 (two) times daily.   ondansetron 4 MG disintegrating tablet Commonly known as: ZOFRAN-ODT Take 1-2 tablets (4-8 mg total) by mouth every 6 (six) hours as needed for  nausea or vomiting.   PNV-DHA+Docusate 27-1.25-300 MG Caps Take 1 capsule by mouth daily before breakfast.   promethazine 25 MG tablet Commonly known as: PHENERGAN Take 1 tablet (25 mg total) by mouth every 6 (six) hours as needed for nausea or vomiting.   scopolamine 1 MG/3DAYS Commonly known as: TRANSDERM-SCOP Place 1 patch (1.5 mg total) onto the skin every 3 (three) days. Start taking on: January 08, 2022       Julianne Handler, [place]  01/05/2022 10:09 PM

## 2022-01-06 ENCOUNTER — Telehealth: Payer: Self-pay

## 2022-01-06 LAB — CULTURE, OB URINE

## 2022-01-06 LAB — URINE CULTURE, OB REFLEX

## 2022-01-06 NOTE — Telephone Encounter (Signed)
The patient was contacted for an appointment for a follow-up visit from MAU.  The office was unable to speak with the patient or leave a message as the VM was full.  We will attempt to contact the patient again.

## 2022-01-08 ENCOUNTER — Encounter (HOSPITAL_COMMUNITY): Payer: Self-pay | Admitting: Family Medicine

## 2022-01-08 ENCOUNTER — Other Ambulatory Visit: Payer: Self-pay

## 2022-01-08 ENCOUNTER — Inpatient Hospital Stay (EMERGENCY_DEPARTMENT_HOSPITAL)
Admission: AD | Admit: 2022-01-08 | Discharge: 2022-01-08 | Disposition: A | Discharge status: Home / Self Care | Payer: 59 | Source: Home / Self Care | Attending: Family Medicine | Admitting: Family Medicine

## 2022-01-08 DIAGNOSIS — Z3A12 12 weeks gestation of pregnancy: Secondary | ICD-10-CM

## 2022-01-08 DIAGNOSIS — K5090 Crohn's disease, unspecified, without complications: Secondary | ICD-10-CM | POA: Insufficient documentation

## 2022-01-08 DIAGNOSIS — O10919 Unspecified pre-existing hypertension complicating pregnancy, unspecified trimester: Secondary | ICD-10-CM

## 2022-01-08 DIAGNOSIS — O10911 Unspecified pre-existing hypertension complicating pregnancy, first trimester: Secondary | ICD-10-CM | POA: Insufficient documentation

## 2022-01-08 DIAGNOSIS — O99611 Diseases of the digestive system complicating pregnancy, first trimester: Secondary | ICD-10-CM | POA: Insufficient documentation

## 2022-01-08 DIAGNOSIS — O211 Hyperemesis gravidarum with metabolic disturbance: Secondary | ICD-10-CM | POA: Diagnosis not present

## 2022-01-08 DIAGNOSIS — O210 Mild hyperemesis gravidarum: Secondary | ICD-10-CM | POA: Diagnosis not present

## 2022-01-08 LAB — BASIC METABOLIC PANEL
Anion gap: 15 (ref 5–15)
BUN: <5 mg/dL — ABNORMAL LOW (ref 6–20)
CO2: 23 mmol/L (ref 22–32)
Calcium: 9 mg/dL (ref 8.9–10.3)
Chloride: 95 mmol/L — ABNORMAL LOW (ref 98–111)
Creatinine, Ser: 0.63 mg/dL (ref 0.44–1.00)
GFR, Estimated: >60 mL/min (ref >60)
Glucose, Bld: 125 mg/dL — ABNORMAL HIGH (ref 70–99)
Potassium: 3.2 mmol/L — ABNORMAL LOW (ref 3.5–5.1)
Sodium: 133 mmol/L — ABNORMAL LOW (ref 135–145)

## 2022-01-08 LAB — URINALYSIS, ROUTINE W REFLEX MICROSCOPIC
Bacteria, UA: NONE SEEN
Bilirubin Urine: NEGATIVE
Glucose, UA: 50 mg/dL — AB
Hgb urine dipstick: NEGATIVE
Ketones, ur: 80 mg/dL — AB
Leukocytes,Ua: NEGATIVE
Nitrite: NEGATIVE
Protein, ur: 30 mg/dL — AB
Specific Gravity, Urine: 1.017 (ref 1.005–1.030)
pH: 7 (ref 5.0–8.0)

## 2022-01-08 LAB — CYTOLOGY - PAP
Comment: NEGATIVE
Diagnosis: NEGATIVE
High risk HPV: NEGATIVE

## 2022-01-08 MED ORDER — PROMETHAZINE HCL 25 MG PO TABS
25.0000 mg | ORAL_TABLET | Freq: Three times a day (TID) | ORAL | 1 refills | Status: DC | PRN
Start: 2022-01-08 — End: 2022-02-26

## 2022-01-08 MED ORDER — GLYCOPYRROLATE 0.2 MG/ML IJ SOLN
0.2000 mg | Freq: Once | INTRAMUSCULAR | Status: AC
  Administered 2022-01-08: 0.2 mg via INTRAVENOUS
  Filled 2022-01-08: qty 1

## 2022-01-08 MED ORDER — LACTATED RINGERS IV BOLUS
1000.0000 mL | Freq: Once | INTRAVENOUS | Status: AC
  Administered 2022-01-08: 1000 mL via INTRAVENOUS

## 2022-01-08 MED ORDER — LABETALOL HCL 5 MG/ML IV SOLN
20.0000 mg | Freq: Once | INTRAVENOUS | Status: AC
  Administered 2022-01-08: 20 mg via INTRAVENOUS
  Filled 2022-01-08: qty 4

## 2022-01-08 MED ORDER — FAMOTIDINE IN NACL 20-0.9 MG/50ML-% IV SOLN
20.0000 mg | Freq: Once | INTRAVENOUS | Status: AC
  Administered 2022-01-08: 20 mg via INTRAVENOUS
  Filled 2022-01-08: qty 50

## 2022-01-08 MED ORDER — LABETALOL HCL 300 MG PO TABS
300.0000 mg | ORAL_TABLET | Freq: Two times a day (BID) | ORAL | 0 refills | Status: DC
Start: 2022-01-08 — End: 2022-01-12

## 2022-01-08 MED ORDER — SODIUM CHLORIDE 0.9 % IV SOLN
8.0000 mg | Freq: Once | INTRAVENOUS | Status: AC
  Administered 2022-01-08: 8 mg via INTRAVENOUS
  Filled 2022-01-08: qty 4

## 2022-01-08 MED ORDER — FAMOTIDINE 20 MG PO TABS
20.0000 mg | ORAL_TABLET | Freq: Two times a day (BID) | ORAL | 0 refills | Status: DC
Start: 2022-01-08 — End: 2022-01-31

## 2022-01-08 MED ORDER — SODIUM CHLORIDE 0.9 % IV SOLN
25.0000 mg | Freq: Once | INTRAVENOUS | Status: AC
  Administered 2022-01-08: 25 mg via INTRAVENOUS
  Filled 2022-01-08: qty 1

## 2022-01-08 MED ORDER — LABETALOL HCL 100 MG PO TABS
200.0000 mg | ORAL_TABLET | Freq: Once | ORAL | Status: AC
  Administered 2022-01-08: 200 mg via ORAL
  Filled 2022-01-08: qty 2

## 2022-01-08 NOTE — Progress Notes (Addendum)
History     CSN: [id number]  Arrival date and time: 01/08/22 9833   Event Date/Time   First Provider Initiated Contact with Patient 01/08/22 [phone]      Chief Complaint  Patient presents with   Emesis   Nausea   Kaylanie Capili is a 36yo G3P0 with h/o chronic HTN and Crohn's Disease currently pregnant at [redacted]w[redacted]d who presents with nausea, vomiting, and abdominal pain and has elevated blood pressure. She said she has been vomiting nonstop since 10pm yesterday. She initially presented with these symptoms on Friday, 2/3, and this is her third return visit to the MAU with similar symptoms over the past week.   At a prior visit, she was instructed to consume broth or ginger ale, but they have caused vomiting. The vomit appears "greenish with specks of blood". She has attempted to take promethazine pills PO and sublingual zofran as prescribed, every 6 hours (2 hours apart), but has to vomit afterward. She has a scopolamine patch in place. Her blood pressure is elevated, but she has not taken labetalol since 6pm yesterday. She denies marijuana use over the past few weeks, but on Monday, her cousin gave her a vape pen to try "with oils that would help her want to eat". She reports decreased appetite, but she attempted to eat a grilled chicken sandwich yesterday, which led to worsening of symptoms. She has not had a bowel movement in two days.   OB History     Gravida  3   Para  0   Term      Preterm      AB  2   Living         SAB      IAB      Ectopic      Multiple      Live Births              Past Medical History:  Diagnosis Date   Crohn's colitis ([place])    Hypertension    Migraine    "a few/year" (07/27/2018)   Non-compliant patient     Past Surgical History:  Procedure Laterality Date   INDUCED ABORTION     NO PAST SURGERIES      Family History  Problem Relation Age of Onset   Hypertension Mother    Diabetes Mother    Heart disease Mother    Diabetes Father     Hypertension Maternal Grandmother    Stroke Maternal Grandmother    Hypertension Maternal Grandfather    Diabetes Maternal Grandfather    Breast cancer Maternal Aunt     Social History   Tobacco Use   Smoking status: Never   Smokeless tobacco: Never  Vaping Use   Vaping Use: Never used  Substance Use Topics   Alcohol use: Not Currently    Alcohol/week: 2.0 standard drinks    Types: 1 Glasses of wine, 1 Cans of beer per week    Comment: 1 x per week, prior to pregnancy   Drug use: Not Currently    Types: Marijuana    Comment: occaisonally, not since confirmed pregnancy    Allergies:  Allergies  Allergen Reactions   Bee Venom Anaphylaxis   Ciprofloxacin Anaphylaxis    Medications Prior to Admission  Medication Sig Dispense Refill Last Dose   famotidine (PEPCID) 10 MG tablet Take 1 tablet (10 mg total) by mouth 2 (two) times daily. 60 tablet 0 01/07/2022 at 1800   labetalol (NORMODYNE) 200 MG tablet  Take 1 tablet (200 mg total) by mouth 2 (two) times daily. 60 tablet 2 01/07/2022 at 1800   ondansetron (ZOFRAN-ODT) 4 MG disintegrating tablet Take 1-2 tablets (4-8 mg total) by mouth every 6 (six) hours as needed for nausea or vomiting. 45 tablet 1 01/08/2022   Prenat-FeFum-DSS-FA-DHA w/o A (PNV-DHA+DOCUSATE) 27-1.25-300 MG CAPS Take 1 capsule by mouth daily before breakfast. 90 capsule 4 Past Month   promethazine (PHENERGAN) 25 MG tablet Take 1 tablet (25 mg total) by mouth every 6 (six) hours as needed for nausea or vomiting. 30 tablet 0 01/08/2022   scopolamine (TRANSDERM-SCOP) 1 MG/3DAYS Place 1 patch (1.5 mg total) onto the skin every 3 (three) days. 10 patch 1 01/08/2022    Review of Systems  Constitutional:  Positive for appetite change and fatigue. Negative for fever.  Eyes:  Negative for visual disturbance.  Respiratory:  Negative for cough, chest tightness and shortness of breath.   Cardiovascular:  Negative for chest pain.  Gastrointestinal:  Positive for abdominal pain,  constipation, nausea and vomiting. Negative for blood in stool and diarrhea.  Genitourinary:  Negative for difficulty urinating, dysuria and vaginal bleeding.  Neurological:  Negative for dizziness and seizures.  Physical Exam   Blood pressure (!) 164/124, pulse (!) 108, temperature 98.7 F (37.1 C), temperature source Oral, resp. rate 20, height 5' (1.524 m), weight 89.1 kg, last menstrual period 10/14/2021, SpO2 96 %, unknown if currently breastfeeding.  Physical Exam Vitals and nursing note reviewed.  Constitutional:      General: She is not in acute distress. HENT:     Head: Normocephalic and atraumatic.  Pulmonary:     Effort: Pulmonary effort is normal.  Skin:    General: Skin is dry.  Neurological:     Mental Status: She is alert.    MAU Course  Procedures  MDM Patient presented with nausea and vomiting. Blood pressure elevated on arrival to 186/115. Ordered UA and BMP. Started IV LR 1,057mL bolus with Phenergan 25mg  in NS, Pepcid 20mg , and labetalol 20mg .   Assessment and Plan   Nana Vastine is a 36yo G3P0 with h/o chronic HTN and Crohn's Disease currently pregnant at [redacted]w[redacted]d who presents with nausea, vomiting, and abdominal pain and has elevated blood pressure currently in stable condition. Patient has recurrent vomiting and -5.5% change in weight since her initial prenatal visit on 01/02/22, and most likely indicates hyperemesis gravidarum.  Hyperemesis Gravidarum -  Start vaginal or rectal ROA for Phenergan and Pepcid  Danie Chandler 01/08/2022, 9:52 AM     Attestation of Supervision of Student:  I confirm that I have verified the information documented in the medical students note and that I have also personally performed the history, physical exam and all medical decision making activities.  I have verified that all services and findings are accurately documented in this student's note; and I agree with management and plan as outlined in the documentation. I have  also made any necessary editorial changes.  History BRIENA SWINGLER is a 36 y.o. G3P0020 at [redacted]w[redacted]d who presents with nausea & vomiting. This has been an ongoing issue with the pregnancy. Has zofran, pepcid, & scop patch. Hasn't taken medication today. Applied new scop patch this morning. Reports vomiting non stop since last night. Tried to eat a chicken sandwich but couldn't keep it down. Also has not been able to tolerate her labetalol.  Denies headache, CP, SOB, fever, abdominal pain, or vaginal bleeding.   Physical exam Patient Vitals for the  past 24 hrs:  BP Temp Temp src Pulse Resp SpO2 Height Weight  01/08/22 1401 (!) 179/108 -- -- (!) 123 -- -- -- --  01/08/22 1141 (!) 161/119 -- -- 99 -- -- -- --  01/08/22 1047 126/73 -- -- 91 -- -- -- --  01/08/22 0934 (!) 164/124 -- -- (!) 108 -- 96 % -- --  01/08/22 0918 (!) 186/115 98.7 F (37.1 C) Oral (!) 113 20 99 % -- --  01/08/22 0913 -- -- -- -- -- -- 5' (1.524 m) 89.1 kg    Physical Examination: General appearance - alert, well appearing, and in no distress Mental status - normal mood, behavior, speech, dress, motor activity, and thought processes Eyes - sclera anicteric Chest - normal respiratory effort  MDM FHT present via doppler  Given dose of IV labetalol 20 mg for BP control since patient unable to tolerate her oral dose. After receiving IV fluids & meds, given additional dose of labetalol 200 mg PO. BP remains elevated. Reviewed BPs with Dr. Kennon Rounds who recommends increasing her labetalol to 300 mg BID.   N/v treated with LR bolus, pepcid, phenergan, zofran, & robinul. No longer vomiting & able to tolerate PO challenge. Discussed medication dosing at home & management of symptoms.    Assessment/Plan 1. Hyperemesis gravidarum before end of [redacted] week gestation, dehydration  -increased pepcid to 20 mg BID -refilled antiemetic  2. Chronic hypertension affecting pregnancy  -increased labetalol to 300 mg BID  3. [redacted] weeks gestation  of pregnancy     Jorje Guild, NP [facility] 01/08/2022 9:27 PM

## 2022-01-08 NOTE — MAU Note (Signed)
Presents with c/o N/V, states unable to keep anything down since yesterday despite taking Zofran & Phenergan.  Also states hasn't had a BM in 2 days.

## 2022-01-10 ENCOUNTER — Encounter (HOSPITAL_COMMUNITY): Payer: Self-pay

## 2022-01-10 ENCOUNTER — Other Ambulatory Visit: Payer: Self-pay

## 2022-01-10 ENCOUNTER — Inpatient Hospital Stay (HOSPITAL_COMMUNITY)
Admission: EM | Admit: 2022-01-10 | Discharge: 2022-01-12 | DRG: 832 | Disposition: A | Discharge status: Home / Self Care | Payer: 59 | Attending: Family Medicine | Admitting: Family Medicine

## 2022-01-10 DIAGNOSIS — K5900 Constipation, unspecified: Secondary | ICD-10-CM | POA: Diagnosis present

## 2022-01-10 DIAGNOSIS — E0590 Thyrotoxicosis, unspecified without thyrotoxic crisis or storm: Secondary | ICD-10-CM | POA: Diagnosis present

## 2022-01-10 DIAGNOSIS — O211 Hyperemesis gravidarum with metabolic disturbance: Secondary | ICD-10-CM | POA: Diagnosis present

## 2022-01-10 DIAGNOSIS — Z3A12 12 weeks gestation of pregnancy: Secondary | ICD-10-CM

## 2022-01-10 DIAGNOSIS — K5090 Crohn's disease, unspecified, without complications: Secondary | ICD-10-CM | POA: Diagnosis present

## 2022-01-10 DIAGNOSIS — R112 Nausea with vomiting, unspecified: Secondary | ICD-10-CM | POA: Diagnosis not present

## 2022-01-10 DIAGNOSIS — Z20822 Contact with and (suspected) exposure to covid-19: Secondary | ICD-10-CM | POA: Diagnosis present

## 2022-01-10 DIAGNOSIS — O119 Pre-existing hypertension with pre-eclampsia, unspecified trimester: Secondary | ICD-10-CM | POA: Diagnosis present

## 2022-01-10 DIAGNOSIS — O99611 Diseases of the digestive system complicating pregnancy, first trimester: Secondary | ICD-10-CM | POA: Diagnosis present

## 2022-01-10 DIAGNOSIS — O10919 Unspecified pre-existing hypertension complicating pregnancy, unspecified trimester: Secondary | ICD-10-CM | POA: Diagnosis present

## 2022-01-10 DIAGNOSIS — D72829 Elevated white blood cell count, unspecified: Secondary | ICD-10-CM | POA: Diagnosis present

## 2022-01-10 DIAGNOSIS — O99281 Endocrine, nutritional and metabolic diseases complicating pregnancy, first trimester: Secondary | ICD-10-CM | POA: Diagnosis present

## 2022-01-10 DIAGNOSIS — O210 Mild hyperemesis gravidarum: Secondary | ICD-10-CM | POA: Diagnosis present

## 2022-01-10 DIAGNOSIS — E876 Hypokalemia: Secondary | ICD-10-CM

## 2022-01-10 DIAGNOSIS — O10011 Pre-existing essential hypertension complicating pregnancy, first trimester: Secondary | ICD-10-CM | POA: Diagnosis present

## 2022-01-10 LAB — COMPREHENSIVE METABOLIC PANEL
ALT: 45 U/L — ABNORMAL HIGH (ref 0–44)
AST: 38 U/L (ref 15–41)
Albumin: 4.1 g/dL (ref 3.5–5.0)
Alkaline Phosphatase: 69 U/L (ref 38–126)
Anion gap: 13 (ref 5–15)
BUN: 7 mg/dL (ref 6–20)
CO2: 22 mmol/L (ref 22–32)
Calcium: 9.4 mg/dL (ref 8.9–10.3)
Chloride: 98 mmol/L (ref 98–111)
Creatinine, Ser: 0.58 mg/dL (ref 0.44–1.00)
GFR, Estimated: >60 mL/min (ref >60)
Glucose, Bld: 104 mg/dL — ABNORMAL HIGH (ref 70–99)
Potassium: 2.3 mmol/L — CL (ref 3.5–5.1)
Sodium: 133 mmol/L — ABNORMAL LOW (ref 135–145)
Total Bilirubin: 0.6 mg/dL (ref 0.3–1.2)
Total Protein: 8.3 g/dL — ABNORMAL HIGH (ref 6.5–8.1)

## 2022-01-10 LAB — T4, FREE: Free T4: 1.29 ng/dL — ABNORMAL HIGH (ref 0.61–1.12)

## 2022-01-10 LAB — URINALYSIS, ROUTINE W REFLEX MICROSCOPIC
Bilirubin Urine: NEGATIVE
Glucose, UA: 50 mg/dL — AB
Hgb urine dipstick: NEGATIVE
Ketones, ur: 80 mg/dL — AB
Leukocytes,Ua: NEGATIVE
Nitrite: NEGATIVE
Protein, ur: 100 mg/dL — AB
Specific Gravity, Urine: 1.019 (ref 1.005–1.030)
pH: 7 (ref 5.0–8.0)

## 2022-01-10 LAB — CBC WITH DIFFERENTIAL/PLATELET
Abs Immature Granulocytes: 0.11 10*3/uL — ABNORMAL HIGH (ref 0.00–0.07)
Basophils Absolute: 0.1 10*3/uL (ref 0.0–0.1)
Basophils Relative: 0 %
Eosinophils Absolute: 0 10*3/uL (ref 0.0–0.5)
Eosinophils Relative: 0 %
HCT: 44.2 % (ref 36.0–46.0)
Hemoglobin: 15.7 g/dL — ABNORMAL HIGH (ref 12.0–15.0)
Immature Granulocytes: 1 %
Lymphocytes Relative: 11 %
Lymphs Abs: 2.2 10*3/uL (ref 0.7–4.0)
MCH: 27.1 pg (ref 26.0–34.0)
MCHC: 35.5 g/dL (ref 30.0–36.0)
MCV: 76.2 fL — ABNORMAL LOW (ref 80.0–100.0)
Monocytes Absolute: 1.1 10*3/uL — ABNORMAL HIGH (ref 0.1–1.0)
Monocytes Relative: 5 %
Neutro Abs: 17 10*3/uL — ABNORMAL HIGH (ref 1.7–7.7)
Neutrophils Relative %: 83 %
Platelets: 436 10*3/uL — ABNORMAL HIGH (ref 150–400)
RBC: 5.8 MIL/uL — ABNORMAL HIGH (ref 3.87–5.11)
RDW: 14.6 % (ref 11.5–15.5)
WBC: 20.4 10*3/uL — ABNORMAL HIGH (ref 4.0–10.5)
nRBC: 0 % (ref 0.0–0.2)

## 2022-01-10 LAB — TSH: TSH: 0.204 u[IU]/mL — ABNORMAL LOW (ref 0.350–4.500)

## 2022-01-10 LAB — RESP PANEL BY RT-PCR (FLU A&B, COVID) ARPGX2
Influenza A by PCR: NEGATIVE
Influenza B by PCR: NEGATIVE
SARS Coronavirus 2 by RT PCR: NEGATIVE

## 2022-01-10 LAB — CREATININE, SERUM
Creatinine, Ser: 0.61 mg/dL (ref 0.44–1.00)
GFR, Estimated: >60 mL/min (ref >60)

## 2022-01-10 LAB — LIPASE, BLOOD: Lipase: 38 U/L (ref 11–51)

## 2022-01-10 MED ORDER — GLYCOPYRROLATE 0.2 MG/ML IJ SOLN
0.2000 mg | Freq: Three times a day (TID) | INTRAMUSCULAR | Status: DC
  Administered 2022-01-10 – 2022-01-11 (×5): 0.2 mg via INTRAVENOUS
  Filled 2022-01-10 (×5): qty 1

## 2022-01-10 MED ORDER — PROCHLORPERAZINE EDISYLATE 10 MG/2ML IJ SOLN
10.0000 mg | Freq: Once | INTRAMUSCULAR | Status: AC
  Administered 2022-01-10: 10 mg via INTRAVENOUS
  Filled 2022-01-10: qty 2

## 2022-01-10 MED ORDER — POTASSIUM CHLORIDE 10 MEQ/100ML IV SOLN
10.0000 meq | INTRAVENOUS | Status: AC
  Administered 2022-01-10 (×6): 10 meq via INTRAVENOUS
  Filled 2022-01-10 (×6): qty 100

## 2022-01-10 MED ORDER — PYRIDOXINE HCL 100 MG/ML IJ SOLN
100.0000 mg | Freq: Once | INTRAMUSCULAR | Status: AC
Start: 2022-01-10 — End: 2022-01-10
  Administered 2022-01-10: 100 mg via INTRAVENOUS
  Filled 2022-01-10: qty 1

## 2022-01-10 MED ORDER — PANTOPRAZOLE SODIUM 40 MG IV SOLR
40.0000 mg | INTRAVENOUS | Status: DC
  Administered 2022-01-10 – 2022-01-11 (×2): 40 mg via INTRAVENOUS
  Filled 2022-01-10 (×2): qty 10

## 2022-01-10 MED ORDER — DIPHENHYDRAMINE HCL 50 MG/ML IJ SOLN
25.0000 mg | Freq: Once | INTRAMUSCULAR | Status: AC
  Administered 2022-01-10: 25 mg via INTRAVENOUS
  Filled 2022-01-10: qty 1

## 2022-01-10 MED ORDER — POTASSIUM CHLORIDE 2 MEQ/ML IV SOLN
INTRAVENOUS | Status: DC
  Filled 2022-01-10 (×7): qty 1000

## 2022-01-10 MED ORDER — SODIUM CHLORIDE 0.9 % IV SOLN
12.5000 mg | Freq: Four times a day (QID) | INTRAVENOUS | Status: DC | PRN
  Filled 2022-01-10: qty 0.5

## 2022-01-10 MED ORDER — ENOXAPARIN SODIUM 40 MG/0.4ML IJ SOSY
40.0000 mg | PREFILLED_SYRINGE | INTRAMUSCULAR | Status: DC
  Administered 2022-01-10 – 2022-01-11 (×2): 40 mg via SUBCUTANEOUS
  Filled 2022-01-10 (×2): qty 0.4

## 2022-01-10 MED ORDER — ONDANSETRON HCL 4 MG/2ML IJ SOLN
4.0000 mg | Freq: Four times a day (QID) | INTRAMUSCULAR | Status: DC
  Administered 2022-01-10 – 2022-01-12 (×7): 4 mg via INTRAVENOUS
  Filled 2022-01-10 (×7): qty 2

## 2022-01-10 MED ORDER — SCOPOLAMINE 1 MG/3DAYS TD PT72
1.0000 | MEDICATED_PATCH | TRANSDERMAL | Status: DC
  Administered 2022-01-10: 1.5 mg via TRANSDERMAL
  Filled 2022-01-10: qty 1

## 2022-01-10 MED ORDER — METOCLOPRAMIDE HCL 5 MG/ML IJ SOLN
10.0000 mg | Freq: Four times a day (QID) | INTRAMUSCULAR | Status: DC
  Administered 2022-01-10 – 2022-01-12 (×7): 10 mg via INTRAVENOUS
  Filled 2022-01-10 (×7): qty 2

## 2022-01-10 MED ORDER — KCL-LACTATED RINGERS 20 MEQ/L IV SOLN
INTRAVENOUS | Status: DC
  Filled 2022-01-10: qty 1000

## 2022-01-10 MED ORDER — LABETALOL HCL 200 MG PO TABS
300.0000 mg | ORAL_TABLET | Freq: Two times a day (BID) | ORAL | Status: DC
  Administered 2022-01-10 – 2022-01-11 (×2): 300 mg via ORAL
  Filled 2022-01-10 (×4): qty 1

## 2022-01-10 MED ORDER — LACTATED RINGERS IV BOLUS
1000.0000 mL | Freq: Once | INTRAVENOUS | Status: AC
  Administered 2022-01-10: 1000 mL via INTRAVENOUS

## 2022-01-10 NOTE — ED Provider Triage Note (Signed)
Emergency Medicine Provider Triage Evaluation Note  ABBEGALE STEHLE , a 36 y.o. female  was evaluated in triage.  Pt complains of persistent vomiting and generalized abdominal cramping.  Patient is [redacted] weeks pregnant.  Symptoms have been occurring over the past 1 week.  She has had several MAU visits.  Symptoms unchanged with Phenergan and Zofran at home.  No vaginal bleeding or discharge.  History of Crohn's disease, not on any controller medications.  Complains of constipation.  Review of Systems  Positive: Vomiting and constipation Negative: Fevers  Physical Exam  BP (!) 178/120 (BP Location: Left Arm)    Pulse (!) 109    Temp 98.8 F (37.1 C) (Oral)    Resp 16    Ht 5' (1.524 m)    Wt 90.7 kg    LMP 10/14/2021 (Exact Date)    SpO2 100%    BMI 39.06 kg/m  Gen:   Awake, no distress   Resp:  Normal effort  MSK:   Moves extremities without difficulty  Other:  Mild generalized abdominal tenderness  Medical Decision Making  Medically screening exam initiated at 12:14 PM.  Appropriate orders placed.  BRYONA FOXWORTHY was informed that the remainder of the evaluation will be completed by another provider, this initial triage assessment does not replace that evaluation, and the importance of remaining in the ED until their evaluation is complete.     Carlisle Cater, PA-C 01/10/22 1216

## 2022-01-10 NOTE — ED Provider Notes (Signed)
[facility] Provider Note  CSN: [id number] Arrival date & time: 01/10/22 1134  Chief Complaint(s) Emesis and [redacted] weeks pregnant  HPI Frances Mccormick is a 36 y.o. female G3, P0 with PMH Crohn's colitis, HTN currently [redacted] weeks pregnant who presents to the emergency department for evaluation of nausea, vomiting and inability to tolerate p.o.  States that she has had approximately 7 days of symptoms and 5 days ago she was seen in the MAU where she received IV medication and was transition to oral antiemetics.  She states that as soon as she got home she had persistent vomiting and despite taking these meds scheduled, she has been unable to tolerate p.o. for 7 days.  She denies chest pain, shortness of breath, abdominal pain, vaginal bleeding, vaginal discharge, dysuria, diarrhea or any other systemic symptoms.     Emesis  Past Medical History Past Medical History:  Diagnosis Date   Crohn's colitis ([place])    Hypertension    Migraine    "a few/year" (07/27/2018)   Non-compliant patient    Patient Active Problem List   Diagnosis Date Noted   Hyperemesis affecting pregnancy, antepartum 01/10/2022   Supervision of normal pregnancy in first trimester 12/16/2021   Exacerbation of Crohn's disease without complication ([place]) 78/29/5621   Colitis 06/07/2021   Enterocolitis 06/07/2021   Hypertension 09/30/2018   Hypokalemia 09/30/2018   Leukocytosis 09/30/2018   Pilonidal abscess 09/30/2018   Intractable nausea and vomiting 09/30/2018   Chronic hypertension during pregnancy, antepartum 08/11/2018   Medically noncompliant 10/26/2014   Crohn's disease ([place]) 10/26/2014   Home Medication(s) Prior to Admission medications   Medication Sig Start Date End Date Taking? Authorizing Provider  famotidine (PEPCID) 20 MG tablet Take 1 tablet (20 mg total) by mouth 2 (two) times daily. 01/08/22 02/07/22  Jorje Guild, NP  labetalol (NORMODYNE) 300 MG tablet Take 1 tablet (300 mg total) by  mouth 2 (two) times daily. 01/08/22 02/07/22  Jorje Guild, NP  ondansetron (ZOFRAN-ODT) 4 MG disintegrating tablet Take 1-2 tablets (4-8 mg total) by mouth every 6 (six) hours as needed for nausea or vomiting. 01/04/22   Gavin Pound, CNM  Prenat-FeFum-DSS-FA-DHA w/o A (PNV-DHA+DOCUSATE) 27-1.25-300 MG CAPS Take 1 capsule by mouth daily before breakfast. 01/02/22   Shelly Bombard, MD  promethazine (PHENERGAN) 25 MG tablet Take 1 tablet (25 mg total) by mouth every 8 (eight) hours as needed for nausea or vomiting. 01/08/22   Jorje Guild, NP  scopolamine (TRANSDERM-SCOP) 1 MG/3DAYS Place 1 patch (1.5 mg total) onto the skin every 3 (three) days. 01/08/22   Julianne Handler, CNM  metoCLOPramide (REGLAN) 10 MG tablet Take 1 tablet (10 mg total) by mouth every 8 (eight) hours as needed for nausea. Patient not taking: Reported on 03/02/2020 10/03/18 03/23/20  Debbe Odea, MD  potassium chloride SA (K-DUR,KLOR-CON) 20 MEQ tablet Take 2 tablets (40 mEq total) by mouth daily for 4 days. Patient not taking: Reported on 03/02/2020 10/03/18 03/23/20  Debbe Odea, MD  Past Surgical History Past Surgical History:  Procedure Laterality Date   INDUCED ABORTION     Family History Family History  Problem Relation Age of Onset   Hypertension Mother    Diabetes Mother    Heart disease Mother    Diabetes Father    Hypertension Maternal Grandmother    Stroke Maternal Grandmother    Hypertension Maternal Grandfather    Diabetes Maternal Grandfather    Breast cancer Maternal Aunt     Social History Social History   Tobacco Use   Smoking status: Never   Smokeless tobacco: Never  Vaping Use   Vaping Use: Never used  Substance Use Topics   Alcohol use: Not Currently    Alcohol/week: 2.0 standard drinks    Types: 1 Glasses of wine, 1 Cans of beer per week    Comment: 1 x per week,  prior to pregnancy   Drug use: Not Currently    Types: Marijuana    Comment: occaisonally, not since confirmed pregnancy   Allergies Bee venom and Ciprofloxacin  Review of Systems Review of Systems  Gastrointestinal:  Positive for nausea and vomiting.   Physical Exam Vital Signs  I have reviewed the triage vital signs BP 131/72    Pulse 75    Temp 98.7 F (37.1 C)    Resp 15    Ht 5' (1.524 m)    Wt 90.7 kg    LMP 10/14/2021 (Exact Date)    SpO2 100%    BMI 39.06 kg/m   Physical Exam Vitals and nursing note reviewed.  Constitutional:      General: She is not in acute distress.    Appearance: She is well-developed. She is ill-appearing.  HENT:     Head: Normocephalic and atraumatic.  Eyes:     Conjunctiva/sclera: Conjunctivae normal.  Cardiovascular:     Rate and Rhythm: Normal rate and regular rhythm.     Heart sounds: No murmur heard. Pulmonary:     Effort: Pulmonary effort is normal. No respiratory distress.     Breath sounds: Normal breath sounds.  Abdominal:     Palpations: Abdomen is soft.     Tenderness: There is no abdominal tenderness.  Musculoskeletal:        General: No swelling.     Cervical back: Neck supple.  Skin:    General: Skin is warm and dry.     Capillary Refill: Capillary refill takes less than 2 seconds.  Neurological:     Mental Status: She is alert.  Psychiatric:        Mood and Affect: Mood normal.    ED Results and Treatments Labs (all labs ordered are listed, but only abnormal results are displayed) Labs Reviewed  COMPREHENSIVE METABOLIC PANEL - Abnormal; Notable for the following components:      Result Value   Sodium 133 (*)    Potassium 2.3 (*)    Glucose, Bld 104 (*)    Total Protein 8.3 (*)    ALT 45 (*)    All other components within normal limits  CBC WITH DIFFERENTIAL/PLATELET - Abnormal; Notable for the following components:   WBC 20.4 (*)    RBC 5.80 (*)    Hemoglobin 15.7 (*)    MCV 76.2 (*)    Platelets 436 (*)     Neutro Abs 17.0 (*)    Monocytes Absolute 1.1 (*)    Abs Immature Granulocytes 0.11 (*)    All other components within normal limits  URINALYSIS, ROUTINE  W REFLEX MICROSCOPIC - Abnormal; Notable for the following components:   Color, Urine AMBER (*)    APPearance CLOUDY (*)    Glucose, UA 50 (*)    Ketones, ur 80 (*)    Protein, ur 100 (*)    Bacteria, UA RARE (*)    All other components within normal limits  RESP PANEL BY RT-PCR (FLU A&B, COVID) ARPGX2  LIPASE, BLOOD  CREATININE, SERUM  TSH  T4, FREE  T3, FREE                                                                                                                          Radiology No results found.  Pertinent labs & imaging results that were available during my care of the patient were reviewed by me and considered in my medical decision making (see MDM for details).  Medications Ordered in ED Medications  lactated ringers 1,000 mL with potassium chloride 20 mEq/L  ( Intravenous New Bag/Given 01/10/22 1630)  labetalol (NORMODYNE) tablet 300 mg (has no administration in time range)  scopolamine (TRANSDERM-SCOP) 1 MG/3DAYS 1.5 mg (1.5 mg Transdermal Patch Applied 01/10/22 1648)  enoxaparin (LOVENOX) injection 40 mg (40 mg Subcutaneous Given 01/10/22 1653)  ondansetron (ZOFRAN) injection 4 mg (4 mg Intravenous Given 01/10/22 1647)  promethazine (PHENERGAN) 12.5 mg in sodium chloride 0.9 % 50 mL IVPB (has no administration in time range)  potassium chloride 10 mEq in 100 mL IVPB (10 mEq Intravenous New Bag/Given 01/10/22 1653)  metoCLOPramide (REGLAN) injection 10 mg (has no administration in time range)  glycopyrrolate (ROBINUL) injection 0.2 mg (has no administration in time range)  pantoprazole (PROTONIX) injection 40 mg (has no administration in time range)  prochlorperazine (COMPAZINE) injection 10 mg (10 mg Intravenous Given 01/10/22 1455)  diphenhydrAMINE (BENADRYL) injection 25 mg (25 mg Intravenous Given 01/10/22 1456)   pyridOXINE (B-6) injection 100 mg (100 mg Intravenous Given 01/10/22 1515)  lactated ringers bolus 1,000 mL (1,000 mLs Intravenous New Bag/Given 01/10/22 1455)                                                                                                                                     Procedures .Critical Care Performed by: Teressa Lower, MD Authorized by: Teressa Lower, MD   Critical care provider statement:    Critical care time (minutes):  30   Critical care was necessary to treat or  prevent imminent or life-threatening deterioration of the following conditions:  Metabolic crisis   Critical care was time spent personally by me on the following activities:  Development of treatment plan with patient or surrogate, discussions with consultants, evaluation of patient's response to treatment, examination of patient, ordering and review of laboratory studies, ordering and review of radiographic studies, ordering and performing treatments and interventions, pulse oximetry, re-evaluation of patient's condition and review of old charts  (including critical care time)  Medical Decision Making / ED Course   This patient presents to the ED for concern of nausea vomiting pregnancy, this involves an extensive number of treatment options, and is a complaint that carries with it a high risk of complications and morbidity.  The differential diagnosis includes hyperemesis gravidarum, food poisoning, enteritis, intra-abdominal infection  MDM: Seen Emergency Department for evaluation of abdominal pain nausea and vomiting.  Physical exam largely unremarkable.  Laboratory evaluation with significant hypokalemia to 2.3, chloride normal, bicarb normal at 22.  UA largely unremarkable.  CBC with significant leukocytosis to 20.4, hemoglobin 15.7, thrombocytosis of 436 likely secondary to hemoconcentration.  Patient given hyperemesis cocktail of Compazine, Benadryl and pyridoxine as well as fluid  resuscitation with lactated Ringer's and maintenance fluids of LR with KCl.  OB/GYN consulted who recommended admission to their service for fluid resuscitation and management of the patient's hyperemesis.  Patient then admitted.   Additional history obtained:  -External records from outside source obtained and reviewed including: Chart review including previous notes, labs, imaging, consultation notes   Lab Tests: -I ordered, reviewed, and interpreted labs.   The pertinent results include:   Labs Reviewed  COMPREHENSIVE METABOLIC PANEL - Abnormal; Notable for the following components:      Result Value   Sodium 133 (*)    Potassium 2.3 (*)    Glucose, Bld 104 (*)    Total Protein 8.3 (*)    ALT 45 (*)    All other components within normal limits  CBC WITH DIFFERENTIAL/PLATELET - Abnormal; Notable for the following components:   WBC 20.4 (*)    RBC 5.80 (*)    Hemoglobin 15.7 (*)    MCV 76.2 (*)    Platelets 436 (*)    Neutro Abs 17.0 (*)    Monocytes Absolute 1.1 (*)    Abs Immature Granulocytes 0.11 (*)    All other components within normal limits  URINALYSIS, ROUTINE W REFLEX MICROSCOPIC - Abnormal; Notable for the following components:   Color, Urine AMBER (*)    APPearance CLOUDY (*)    Glucose, UA 50 (*)    Ketones, ur 80 (*)    Protein, ur 100 (*)    Bacteria, UA RARE (*)    All other components within normal limits  RESP PANEL BY RT-PCR (FLU A&B, COVID) ARPGX2  LIPASE, BLOOD  CREATININE, SERUM  TSH  T4, FREE  T3, FREE      Medicines ordered and prescription drug management: Meds ordered this encounter  Medications   prochlorperazine (COMPAZINE) injection 10 mg   diphenhydrAMINE (BENADRYL) injection 25 mg   pyridOXINE (B-6) injection 100 mg   DISCONTD: lactated ringers with KCl 20 mEq/L infusion   lactated ringers bolus 1,000 mL   lactated ringers 1,000 mL with potassium chloride 20 mEq/L    labetalol (NORMODYNE) tablet 300 mg   scopolamine  (TRANSDERM-SCOP) 1 MG/3DAYS 1.5 mg   enoxaparin (LOVENOX) injection 40 mg   ondansetron (ZOFRAN) injection 4 mg   promethazine (PHENERGAN) 12.5 mg in sodium  chloride 0.9 % 50 mL IVPB   potassium chloride 10 mEq in 100 mL IVPB   metoCLOPramide (REGLAN) injection 10 mg   glycopyrrolate (ROBINUL) injection 0.2 mg   pantoprazole (PROTONIX) injection 40 mg    -I have reviewed the patients home medicines and have made adjustments as needed  Critical interventions Potassium repletion, antiemetics  Consultations Obtained: I requested consultation with the OBGY,  and discussed lab and imaging findings as well as pertinent plan - they recommend: admit   Cardiac Monitoring: The patient was maintained on a cardiac monitor.  I personally viewed and interpreted the cardiac monitored which showed an underlying rhythm of: NSR  Social Determinants of Health:  Factors impacting patients care include: none   Reevaluation: After the interventions noted above, I reevaluated the patient and found that they have :improved  Co morbidities that complicate the patient evaluation  Past Medical History:  Diagnosis Date   Crohn's colitis ([place])    Hypertension    Migraine    "a few/year" (07/27/2018)   Non-compliant patient       Dispostion: I considered admission for this patient, and due to inability to tolerate p.o. with critical hypokalemia she was admitted.     Final Clinical Impression(s) / ED Diagnoses Final diagnoses:  None     @PCDICTATION @    Teressa Lower, MD 01/10/22 1729

## 2022-01-10 NOTE — ED Triage Notes (Signed)
Per EMS- patient reports that she has been seen at MAU x 5 for N/V. Patient was given prescriptions, but not working. Patient stated she wants to be admitted to the hospital.

## 2022-01-10 NOTE — H&P (Addendum)
Frances Mccormick is an 36 y.o. G69P0020 female.   Chief Complaint: nausea and vomiting HPI: H/o of hyperemesis and has Chron's disease. On no meds for Chron's. Has no sx's of that. Earlier had N/V but has worsened in last 1 week. Seen for same in ED on 2/8 and treated and sent home. Now back with worsening sx's despite meds and with K-2.3. notes worsening constipation. She is having spitting.  Past Medical History:  Diagnosis Date   Crohn's colitis ([place])    Hypertension    Migraine    "a few/year" (07/27/2018)   Non-compliant patient     Past Surgical History:  Procedure Laterality Date   INDUCED ABORTION      Family History  Problem Relation Age of Onset   Hypertension Mother    Diabetes Mother    Heart disease Mother    Diabetes Father    Hypertension Maternal Grandmother    Stroke Maternal Grandmother    Hypertension Maternal Grandfather    Diabetes Maternal Grandfather    Breast cancer Maternal Aunt    Social History:  reports that she has never smoked. She has never used smokeless tobacco. She reports that she does not currently use alcohol after a past usage of about 2.0 standard drinks per week. She reports that she does not currently use drugs after having used the following drugs: Marijuana.  Allergies:  Allergies  Allergen Reactions   Bee Venom Anaphylaxis   Ciprofloxacin Anaphylaxis    Medications Prior to Admission  Medication Sig Dispense Refill   famotidine (PEPCID) 20 MG tablet Take 1 tablet (20 mg total) by mouth 2 (two) times daily. 60 tablet 0   labetalol (NORMODYNE) 300 MG tablet Take 1 tablet (300 mg total) by mouth 2 (two) times daily. 60 tablet 0   ondansetron (ZOFRAN-ODT) 4 MG disintegrating tablet Take 1-2 tablets (4-8 mg total) by mouth every 6 (six) hours as needed for nausea or vomiting. 45 tablet 1   Prenat-FeFum-DSS-FA-DHA w/o A (PNV-DHA+DOCUSATE) 27-1.25-300 MG CAPS Take 1 capsule by mouth daily before breakfast. 90 capsule 4   promethazine  (PHENERGAN) 25 MG tablet Take 1 tablet (25 mg total) by mouth every 8 (eight) hours as needed for nausea or vomiting. 30 tablet 1   scopolamine (TRANSDERM-SCOP) 1 MG/3DAYS Place 1 patch (1.5 mg total) onto the skin every 3 (three) days. 10 patch 1    A comprehensive review of systems was negative.  Blood pressure 131/72, pulse 75, temperature 98.7 F (37.1 C), resp. rate 15, height 5' (1.524 m), weight 90.7 kg, last menstrual period 10/14/2021, SpO2 100 %, unknown if currently breastfeeding. General appearance: alert, cooperative, appears stated age, moderately obese, and chronically ill appearing Head: Normocephalic, without obvious abnormality, atraumatic Neck: supple, symmetrical, trachea midline Lungs:  normal effort Heart: regular rate and rhythm Abdomen: soft, non-tender; bowel sounds normal; no masses,  no organomegaly Extremities: extremities normal, atraumatic, no cyanosis or edema Skin: Skin color, texture, turgor normal. No rashes or lesions Neurologic: Grossly normal   Lab Results  Component Value Date   WBC 20.4 (H) 01/10/2022   HGB 15.7 (H) 01/10/2022   HCT 44.2 01/10/2022   MCV 76.2 (L) 01/10/2022   PLT 436 (H) 01/10/2022   Lab Results  Component Value Date   PREGTESTUR Positive (A) 12/04/2021   HCG <5.0 09/29/2018   HCGQUANT 7,854 08/06/2018     Assessment/Plan Principal Problem:   Hyperemesis affecting pregnancy, antepartum Active Problems:   Crohn's disease ([place])   Chronic hypertension  during pregnancy, antepartum   Hypokalemia   Leukocytosis  NPO x 24 hours IV fluid repletion KCL repletion Scopolamine patch Zofran Reglan Phenergan prn Protonix Robinul Will need to work on BP control when/if can keep po down.     Donnamae Jude 01/10/2022, 4:37 PM

## 2022-01-10 NOTE — ED Notes (Signed)
Pt left her boots in Rm 9. This writer placed pt's boots in a personal belonging's bag, labeled them and placed in cabinet: 9-12. Secretary will notify pt's mother.

## 2022-01-11 DIAGNOSIS — O210 Mild hyperemesis gravidarum: Secondary | ICD-10-CM

## 2022-01-11 LAB — COMPREHENSIVE METABOLIC PANEL
ALT: 52 U/L — ABNORMAL HIGH (ref 0–44)
AST: 50 U/L — ABNORMAL HIGH (ref 15–41)
Albumin: 3.2 g/dL — ABNORMAL LOW (ref 3.5–5.0)
Alkaline Phosphatase: 61 U/L (ref 38–126)
Anion gap: 13 (ref 5–15)
BUN: 6 mg/dL (ref 6–20)
CO2: 23 mmol/L (ref 22–32)
Calcium: 9 mg/dL (ref 8.9–10.3)
Chloride: 99 mmol/L (ref 98–111)
Creatinine, Ser: 0.77 mg/dL (ref 0.44–1.00)
GFR, Estimated: >60 mL/min (ref >60)
Glucose, Bld: 74 mg/dL (ref 70–99)
Potassium: 2.9 mmol/L — ABNORMAL LOW (ref 3.5–5.1)
Sodium: 135 mmol/L (ref 135–145)
Total Bilirubin: 1.2 mg/dL (ref 0.3–1.2)
Total Protein: 6.8 g/dL (ref 6.5–8.1)

## 2022-01-11 LAB — T3, FREE: T3, Free: 3.4 pg/mL (ref 2.0–4.4)

## 2022-01-11 MED ORDER — POTASSIUM CHLORIDE 10 MEQ/100ML IV SOLN
10.0000 meq | INTRAVENOUS | Status: AC
  Administered 2022-01-11 (×4): 10 meq via INTRAVENOUS
  Filled 2022-01-11 (×4): qty 100

## 2022-01-11 MED ORDER — POTASSIUM CHLORIDE 2 MEQ/ML IV SOLN
INTRAVENOUS | Status: DC
  Filled 2022-01-11 (×2): qty 1000

## 2022-01-11 MED ORDER — POTASSIUM CHLORIDE 20 MEQ PO PACK
40.0000 meq | PACK | Freq: Two times a day (BID) | ORAL | Status: DC
Start: 2022-01-11 — End: 2022-01-12
  Administered 2022-01-11 – 2022-01-12 (×2): 40 meq via ORAL
  Filled 2022-01-11 (×2): qty 2

## 2022-01-11 MED ORDER — LACTATED RINGERS IV SOLN: INTRAVENOUS | Status: DC

## 2022-01-11 MED ORDER — LACTATED RINGERS IV SOLN: INTRAVENOUS | Status: AC

## 2022-01-11 NOTE — Progress Notes (Signed)
[facility] PRACTICE ANTEPARTUM(COMPREHENSIVE) NOTE  Frances Mccormick is a 36 y.o. G3P0020 with Estimated Date of Delivery: 07/21/22   By  LMP [redacted]w[redacted]d who is admitted for hyperemesis.    Length of Stay:  1  Days  Date of admission:01/10/2022  Subjective: Feeling much better this am. No nausea/vomiting overnight.  Feeling hungry this am.  No headache or dizziness.  No acute complaints  Patient reports uterine contraction  activity as none. Patient reports  vaginal bleeding as none. Patient describes fluid per vagina as None.  Vitals:  Blood pressure 113/64, pulse 69, temperature 97.9 F (36.6 C), temperature source Oral, resp. rate 15, height 5' (1.524 m), weight 90.7 kg, last menstrual period 10/14/2021, SpO2 100 %, unknown if currently breastfeeding. Vitals:   01/10/22 1624 01/10/22 1930 01/10/22 2220 01/11/22 0550  BP: 131/72 130/78 136/75 113/64  Pulse: 75 (!) 105 80 69  Resp:      Temp: 98.7 F (37.1 C)   97.9 F (36.6 C)  TempSrc:    Oral  SpO2: 100%     Weight:      Height:       Physical Examination:  General appearance - alert, well appearing, and in no distress Mental status - normal mood, behavior, speech, dress, motor activity, and thought processes Chest - clear to auscultation, no wheezes, rales or rhonchi, symmetric air entry Heart - normal rate and regular rhythm Abdomen - soft and non-tender, no rebound, no guarding Extremities - no edema, no calf tenderness bilaterally Skin - warm and dry  Labs:  Results for orders placed or performed during the hospital encounter of 01/10/22 (from the past 24 hour(s))  Comprehensive metabolic panel   Collection Time: 01/10/22 12:25 PM  Result Value Ref Range   Sodium 133 (L) 135 - 145 mmol/L   Potassium 2.3 (LL) 3.5 - 5.1 mmol/L   Chloride 98 98 - 111 mmol/L   CO2 22 22 - 32 mmol/L   Glucose, Bld 104 (H) 70 - 99 mg/dL   BUN 7 6 - 20 mg/dL   Creatinine, Ser 0.58 0.44 - 1.00 mg/dL   Calcium 9.4 8.9 - 10.3 mg/dL   Total  Protein 8.3 (H) 6.5 - 8.1 g/dL   Albumin 4.1 3.5 - 5.0 g/dL   AST 38 15 - 41 U/L   ALT 45 (H) 0 - 44 U/L   Alkaline Phosphatase 69 38 - 126 U/L   Total Bilirubin 0.6 0.3 - 1.2 mg/dL   GFR, Estimated >60 >60 mL/min   Anion gap 13 5 - 15  CBC with Differential   Collection Time: 01/10/22 12:25 PM  Result Value Ref Range   WBC 20.4 (H) 4.0 - 10.5 K/uL   RBC 5.80 (H) 3.87 - 5.11 MIL/uL   Hemoglobin 15.7 (H) 12.0 - 15.0 g/dL   HCT 44.2 36.0 - 46.0 %   MCV 76.2 (L) 80.0 - 100.0 fL   MCH 27.1 26.0 - 34.0 pg   MCHC 35.5 30.0 - 36.0 g/dL   RDW 14.6 11.5 - 15.5 %   Platelets 436 (H) 150 - 400 K/uL   nRBC 0.0 0.0 - 0.2 %   Neutrophils Relative % 83 %   Neutro Abs 17.0 (H) 1.7 - 7.7 K/uL   Lymphocytes Relative 11 %   Lymphs Abs 2.2 0.7 - 4.0 K/uL   Monocytes Relative 5 %   Monocytes Absolute 1.1 (H) 0.1 - 1.0 K/uL   Eosinophils Relative 0 %   Eosinophils Absolute 0.0 0.0 -  0.5 K/uL   Basophils Relative 0 %   Basophils Absolute 0.1 0.0 - 0.1 K/uL   Immature Granulocytes 1 %   Abs Immature Granulocytes 0.11 (H) 0.00 - 0.07 K/uL  Lipase, blood   Collection Time: 01/10/22 12:25 PM  Result Value Ref Range   Lipase 38 11 - 51 U/L  Urinalysis, Routine w reflex microscopic   Collection Time: 01/10/22 12:25 PM  Result Value Ref Range   Color, Urine AMBER (A) YELLOW   APPearance CLOUDY (A) CLEAR   Specific Gravity, Urine 1.019 1.005 - 1.030   pH 7.0 5.0 - 8.0   Glucose, UA 50 (A) NEGATIVE mg/dL   Hgb urine dipstick NEGATIVE NEGATIVE   Bilirubin Urine NEGATIVE NEGATIVE   Ketones, ur 80 (A) NEGATIVE mg/dL   Protein, ur 100 (A) NEGATIVE mg/dL   Nitrite NEGATIVE NEGATIVE   Leukocytes,Ua NEGATIVE NEGATIVE   RBC / HPF 11-20 0 - 5 RBC/hpf   WBC, UA 6-10 0 - 5 WBC/hpf   Bacteria, UA RARE (A) NONE SEEN   Squamous Epithelial / LPF 0-5 0 - 5   Mucus PRESENT    Budding Yeast PRESENT   Resp Panel by RT-PCR (Flu A&B, Covid) Nasopharyngeal Swab   Collection Time: 01/10/22  4:30 PM   Specimen:  Nasopharyngeal Swab; Nasopharyngeal(NP) swabs in vial transport medium  Result Value Ref Range   SARS Coronavirus 2 by RT PCR NEGATIVE NEGATIVE   Influenza A by PCR NEGATIVE NEGATIVE   Influenza B by PCR NEGATIVE NEGATIVE  Creatinine, serum   Collection Time: 01/10/22  4:30 PM  Result Value Ref Range   Creatinine, Ser 0.61 0.44 - 1.00 mg/dL   GFR, Estimated >60 >60 mL/min  T4, free   Collection Time: 01/10/22  4:30 PM  Result Value Ref Range   Free T4 1.29 (H) 0.61 - 1.12 ng/dL  TSH   Collection Time: 01/10/22  4:32 PM  Result Value Ref Range   TSH 0.204 (L) 0.350 - 4.500 uIU/mL  Comprehensive metabolic panel   Collection Time: 01/11/22  4:35 AM  Result Value Ref Range   Sodium 135 135 - 145 mmol/L   Potassium 2.9 (L) 3.5 - 5.1 mmol/L   Chloride 99 98 - 111 mmol/L   CO2 23 22 - 32 mmol/L   Glucose, Bld 74 70 - 99 mg/dL   BUN 6 6 - 20 mg/dL   Creatinine, Ser 0.77 0.44 - 1.00 mg/dL   Calcium 9.0 8.9 - 10.3 mg/dL   Total Protein 6.8 6.5 - 8.1 g/dL   Albumin 3.2 (L) 3.5 - 5.0 g/dL   AST 50 (H) 15 - 41 U/L   ALT 52 (H) 0 - 44 U/L   Alkaline Phosphatase 61 38 - 126 U/L   Total Bilirubin 1.2 0.3 - 1.2 mg/dL   GFR, Estimated >60 >60 mL/min   Anion gap 13 5 - 15    Imaging Studies:    No results found.   Medications:  Scheduled  enoxaparin (LOVENOX) injection  40 mg Subcutaneous Q24H   glycopyrrolate  0.2 mg Intravenous TID   labetalol  300 mg Oral BID   metoCLOPramide (REGLAN) injection  10 mg Intravenous Q6H   ondansetron (ZOFRAN) IV  4 mg Intravenous Q6H   pantoprazole (PROTONIX) IV  40 mg Intravenous Q24H   scopolamine  1 patch Transdermal Q72H   I have reviewed the patient's current medications.  ASSESSMENT: [id number] Date of Delivery: 07/21/22  Patient Active Problem List   Diagnosis  Date Noted   Hyperemesis affecting pregnancy, antepartum 01/10/2022   Supervision of normal pregnancy in first trimester 12/16/2021   Hypertension during pregnancy  09/30/2018   Hypokalemia 09/30/2018    PLAN: -Advance diet as tolerated today -If tolerating po today, transition from IV to oral anti-emetics -K improved, will order another round of IV K repletion -BP stable, continue Labetalol 356m bid  JClearnce SorrelOzan 01/11/2022,6:48 AM

## 2022-01-12 DIAGNOSIS — E0590 Thyrotoxicosis, unspecified without thyrotoxic crisis or storm: Secondary | ICD-10-CM

## 2022-01-12 DIAGNOSIS — O99281 Endocrine, nutritional and metabolic diseases complicating pregnancy, first trimester: Secondary | ICD-10-CM

## 2022-01-12 DIAGNOSIS — R112 Nausea with vomiting, unspecified: Secondary | ICD-10-CM

## 2022-01-12 DIAGNOSIS — Z3A12 12 weeks gestation of pregnancy: Secondary | ICD-10-CM

## 2022-01-12 HISTORY — DX: Thyrotoxicosis, unspecified without thyrotoxic crisis or storm: E05.90

## 2022-01-12 HISTORY — DX: Thyrotoxicosis, unspecified without thyrotoxic crisis or storm: O99.281

## 2022-01-12 MED ORDER — METOCLOPRAMIDE HCL 10 MG PO TABS: 10.0000 mg | ORAL_TABLET | Freq: Four times a day (QID) | ORAL | 1 refills | Status: DC

## 2022-01-12 MED ORDER — GLYCOPYRROLATE 1 MG PO TABS: 1.0000 mg | ORAL_TABLET | Freq: Three times a day (TID) | ORAL | 1 refills | Status: DC

## 2022-01-12 MED ORDER — LABETALOL HCL 200 MG PO TABS
300.0000 mg | ORAL_TABLET | Freq: Two times a day (BID) | ORAL | 0 refills | Status: DC
Start: 2022-01-12 — End: 2022-03-04

## 2022-01-12 MED ORDER — PANTOPRAZOLE SODIUM 20 MG PO TBEC: 20.0000 mg | DELAYED_RELEASE_TABLET | Freq: Every day | ORAL | 1 refills | Status: DC

## 2022-01-12 MED ORDER — PROMETHAZINE HCL 25 MG RE SUPP: 25.0000 mg | Freq: Four times a day (QID) | RECTAL | 0 refills | Status: DC | PRN

## 2022-01-12 MED ORDER — POTASSIUM CHLORIDE CRYS ER 20 MEQ PO TBCR: 20.0000 meq | EXTENDED_RELEASE_TABLET | Freq: Two times a day (BID) | ORAL | 0 refills | Status: DC

## 2022-01-12 NOTE — Discharge Summary (Signed)
Antenatal Physician Discharge Summary  Patient ID: SHAWNELL Mccormick MRN: [id number] DOB/AGE: 08/21/1986 36 y.o.  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 G3P0020 with IUP at 53w6dadmitted for hyperemesis, having failed outpt management.  She was admitted with hyperemesis and low Potassium of 2.3. She was given IV hydration, scheduled anti-emetics + PPI and Robinul with significant improvement. Also, received IV KCl and po repletion. Her nausea improved, her diet was advanced.  She was deemed stable for discharge to home with outpatient follow up.  Discharge Exam: Temp:  [97.9 F (36.6 C)-98.7 F (37.1 C)] 98.3 F (36.8 C) (02/12 0736) Pulse Rate:  [64-83] 64 (02/12 0736) Resp:  [17-20] 18 (02/12 0736) BP: (102-123)/(50-75) 102/52 (02/12 0736) SpO2:  [100 %] 100 % (02/12 0736) Physical Examination: CONSTITUTIONAL: Well-developed, well-nourished female in no acute distress.  HENT:  Normocephalic, atraumatic, External right and left ear normal.  EYES: Conjunctivae and EOM are normal. No scleral icterus.  NECK: Normal range of motion, supple, no masses SKIN: Skin is warm and dry. No rash noted. No erythema. No pallor. NEUROLOGIC: Alert and oriented to person, place, and time. Normal reflexes, muscle tone coordination. No cranial nerve deficit noted. PSYCHIATRIC: Normal mood and affect. Normal behavior. Normal judgment and thought content. CARDIOVASCULAR: Normal heart rate noted, regular rhythm RESPIRATORY: Effort and breath sounds normal, no problems with respiration  noted MUSCULOSKELETAL: Normal range of motion. No edema and no tenderness. 2+ distal pulses. ABDOMEN: Soft, nontender, nondistended, gravid.    Significant Diagnostic Studies:  Results for orders placed or performed during the hospital encounter of 01/10/22 (from the past 168 hour(s))  Comprehensive metabolic panel   Collection Time: 01/10/22 12:25 PM  Result Value Ref Range   Sodium 133 (L) 135 - 145 mmol/L   Potassium 2.3 (LL) 3.5 - 5.1 mmol/L   Chloride 98 98 - 111 mmol/L   CO2 22 22 - 32 mmol/L   Glucose, Bld 104 (H) 70 - 99 mg/dL   BUN 7 6 - 20 mg/dL   Creatinine, Ser 0.58 0.44 - 1.00 mg/dL   Calcium 9.4 8.9 - 10.3 mg/dL   Total Protein 8.3 (H) 6.5 - 8.1 g/dL   Albumin 4.1 3.5 - 5.0 g/dL   AST 38 15 - 41 U/L   ALT 45 (H) 0 - 44 U/L   Alkaline Phosphatase 69 38 - 126 U/L   Total Bilirubin 0.6 0.3 - 1.2 mg/dL   GFR, Estimated >60 >60 mL/min   Anion gap 13 5 - 15  CBC with Differential   Collection Time: 01/10/22 12:25 PM  Result Value Ref Range   WBC 20.4 (H) 4.0 - 10.5 K/uL   RBC 5.80 (H) 3.87 - 5.11 MIL/uL   Hemoglobin 15.7 (H) 12.0 - 15.0 g/dL   HCT 44.2 36.0 - 46.0 %   MCV 76.2 (L) 80.0 - 100.0 fL   MCH 27.1 26.0 - 34.0 pg   MCHC 35.5 30.0 - 36.0 g/dL   RDW 14.6 11.5 - 15.5 %   Platelets 436 (H) 150 - 400 K/uL   nRBC 0.0 0.0 - 0.2 %   Neutrophils Relative % 83 %  Neutro Abs 17.0 (H) 1.7 - 7.7 K/uL   Lymphocytes Relative 11 %   Lymphs Abs 2.2 0.7 - 4.0 K/uL   Monocytes Relative 5 %   Monocytes Absolute 1.1 (H) 0.1 - 1.0 K/uL   Eosinophils Relative 0 %   Eosinophils Absolute 0.0 0.0 - 0.5 K/uL   Basophils Relative 0 %   Basophils Absolute 0.1 0.0 - 0.1 K/uL   Immature Granulocytes 1 %   Abs Immature Granulocytes 0.11 (H) 0.00 - 0.07 K/uL  Lipase, blood   Collection Time: 01/10/22 12:25 PM  Result Value Ref Range   Lipase 38 11 - 51 U/L  Urinalysis, Routine w reflex microscopic   Collection Time: 01/10/22 12:25 PM  Result Value Ref Range   Color, Urine  AMBER (A) YELLOW   APPearance CLOUDY (A) CLEAR   Specific Gravity, Urine 1.019 1.005 - 1.030   pH 7.0 5.0 - 8.0   Glucose, UA 50 (A) NEGATIVE mg/dL   Hgb urine dipstick NEGATIVE NEGATIVE   Bilirubin Urine NEGATIVE NEGATIVE   Ketones, ur 80 (A) NEGATIVE mg/dL   Protein, ur 100 (A) NEGATIVE mg/dL   Nitrite NEGATIVE NEGATIVE   Leukocytes,Ua NEGATIVE NEGATIVE   RBC / HPF 11-20 0 - 5 RBC/hpf   WBC, UA 6-10 0 - 5 WBC/hpf   Bacteria, UA RARE (A) NONE SEEN   Squamous Epithelial / LPF 0-5 0 - 5   Mucus PRESENT    Budding Yeast PRESENT   Resp Panel by RT-PCR (Flu A&B, Covid) Nasopharyngeal Swab   Collection Time: 01/10/22  4:30 PM   Specimen: Nasopharyngeal Swab; Nasopharyngeal(NP) swabs in vial transport medium  Result Value Ref Range   SARS Coronavirus 2 by RT PCR NEGATIVE NEGATIVE   Influenza A by PCR NEGATIVE NEGATIVE   Influenza B by PCR NEGATIVE NEGATIVE  Creatinine, serum   Collection Time: 01/10/22  4:30 PM  Result Value Ref Range   Creatinine, Ser 0.61 0.44 - 1.00 mg/dL   GFR, Estimated >60 >60 mL/min  T4, free   Collection Time: 01/10/22  4:30 PM  Result Value Ref Range   Free T4 1.29 (H) 0.61 - 1.12 ng/dL  TSH   Collection Time: 01/10/22  4:32 PM  Result Value Ref Range   TSH 0.204 (L) 0.350 - 4.500 uIU/mL  T3, free   Collection Time: 01/10/22  4:32 PM  Result Value Ref Range   T3, Free 3.4 2.0 - 4.4 pg/mL  Comprehensive metabolic panel   Collection Time: 01/11/22  4:35 AM  Result Value Ref Range   Sodium 135 135 - 145 mmol/L   Potassium 2.9 (L) 3.5 - 5.1 mmol/L   Chloride 99 98 - 111 mmol/L   CO2 23 22 - 32 mmol/L   Glucose, Bld 74 70 - 99 mg/dL   BUN 6 6 - 20 mg/dL   Creatinine, Ser 0.77 0.44 - 1.00 mg/dL   Calcium 9.0 8.9 - 10.3 mg/dL   Total Protein 6.8 6.5 - 8.1 g/dL   Albumin 3.2 (L) 3.5 - 5.0 g/dL   AST 50 (H) 15 - 41 U/L   ALT 52 (H) 0 - 44 U/L   Alkaline Phosphatase 61 38 - 126 U/L   Total Bilirubin 1.2 0.3 - 1.2 mg/dL   GFR, Estimated >60 >60  mL/min   Anion gap 13 5 - 15  Results for orders placed or performed during the hospital encounter of 01/08/22 (from the past 168 hour(s))  Urinalysis, Routine w reflex microscopic Urine, Clean Catch  Collection Time: 01/08/22  9:48 AM  Result Value Ref Range   Color, Urine AMBER (A) YELLOW   APPearance TURBID (A) CLEAR   Specific Gravity, Urine 1.017 1.005 - 1.030   pH 7.0 5.0 - 8.0   Glucose, UA 50 (A) NEGATIVE mg/dL   Hgb urine dipstick NEGATIVE NEGATIVE   Bilirubin Urine NEGATIVE NEGATIVE   Ketones, ur 80 (A) NEGATIVE mg/dL   Protein, ur 30 (A) NEGATIVE mg/dL   Nitrite NEGATIVE NEGATIVE   Leukocytes,Ua NEGATIVE NEGATIVE   RBC / HPF 11-20 0 - 5 RBC/hpf   WBC, UA 0-5 0 - 5 WBC/hpf   Bacteria, UA NONE SEEN NONE SEEN   Squamous Epithelial / LPF 0-5 0 - 5   Mucus PRESENT    Amorphous Crystal PRESENT   Basic metabolic panel   Collection Time: 01/08/22 10:28 AM  Result Value Ref Range   Sodium 133 (L) 135 - 145 mmol/L   Potassium 3.2 (L) 3.5 - 5.1 mmol/L   Chloride 95 (L) 98 - 111 mmol/L   CO2 23 22 - 32 mmol/L   Glucose, Bld 125 (H) 70 - 99 mg/dL   BUN <5 (L) 6 - 20 mg/dL   Creatinine, Ser 0.63 0.44 - 1.00 mg/dL   Calcium 9.0 8.9 - 10.3 mg/dL   GFR, Estimated >60 >60 mL/min   Anion gap 15 5 - 15  Results for orders placed or performed during the hospital encounter of 01/05/22 (from the past 168 hour(s))  Urinalysis, Routine w reflex microscopic Urine, Clean Catch   Collection Time: 01/05/22  6:04 PM  Result Value Ref Range   Color, Urine YELLOW YELLOW   APPearance CLOUDY (A) CLEAR   Specific Gravity, Urine 1.020 1.005 - 1.030   pH 6.5 5.0 - 8.0   Glucose, UA NEGATIVE NEGATIVE mg/dL   Hgb urine dipstick TRACE (A) NEGATIVE   Bilirubin Urine NEGATIVE NEGATIVE   Ketones, ur >80 (A) NEGATIVE mg/dL   Protein, ur NEGATIVE NEGATIVE mg/dL   Nitrite NEGATIVE NEGATIVE   Leukocytes,Ua NEGATIVE NEGATIVE  Urinalysis, Microscopic (reflex)   Collection Time: 01/05/22  6:04 PM   Result Value Ref Range   RBC / HPF 6-10 0 - 5 RBC/hpf   WBC, UA 0-5 0 - 5 WBC/hpf   Bacteria, UA RARE (A) NONE SEEN   Squamous Epithelial / LPF 0-5 0 - 5   Mucus PRESENT    Amorphous Crystal PRESENT    US OB Limited  Result Date: 12/20/2021 ----------------------------------------------------------------------  OBSTETRICS REPORT                        (Signed Final 12/20/2021 12:19 pm) ---------------------------------------------------------------------- Patient Info  ID #:       [id number]                          D.O.B.:  [birth year] (35 yrs)  Name:       SHEREA LIPTAK Lantry                Visit Date: 12/16/2021 09:19 am ---------------------------------------------------------------------- Performed By  Attending:        Arlina Robes MD       Ref. Address:      [address]  [place], [place]  Performed By:     Elyn Peers RN         [facility]          [facility]                                                              [facility]  Referred By:      Chancy Milroy                    MD ---------------------------------------------------------------------- Orders  #  Description                           Code        Ordered By  1  US OB LIMITED                         81771.1     Arlina Robes ----------------------------------------------------------------------  #  Order #                     Accession #                Episode #  1  [id number]                   [id number]                 [id number] ---------------------------------------------------------------------- Indications  [redacted] weeks gestation of pregnancy                  Z3A.09  ---------------------------------------------------------------------- Fetal Evaluation  Num Of Fetuses:          1  Fetal Heart Rate(bpm):   171  Cardiac Activity:        Observed ----------------------------------------------------------------------  Biometry  CRL:      23.7  mm     G. Age:  9w 0d                   EDD:    07/21/22 ---------------------------------------------------------------------- Gestational Age  Best:          9w 0d      Det. By:  U/S C R L (12/16/21)     EDD:   07/21/22 ---------------------------------------------------------------------- Comments  [place] live IUP at 45w0dby [place] Measurements are  consistent with LMP provided 10/14/21. ---------------------------------------------------------------------- Impression  Single IUP 966w0dy CRL  + cardiac activity ---------------------------------------------------------------------- Recommendations  F/U OB USKoreas clinically indicated ----------------------------------------------------------------------                  MiArlina RobesMD Electronically Signed Final Report   12/20/2021 12:19 pm ----------------------------------------------------------------------   Future Appointments  Date Time Provider [place]3/01/2022  8:35 AM ArWoodroe ModeMD [facility]    Discharge Condition: Stable  There are no questions and answers to display.         Allergies as of 01/12/2022       Reactions   Bee Venom Anaphylaxis   Ciprofloxacin Anaphylaxis        Medication List     TAKE these medications    famotidine 20 MG tablet Commonly known as: PEPCID Take 1 tablet (20 mg total) by mouth 2 (two) times daily.   glycopyrrolate 1 MG tablet Commonly known as: Robinul Take 1 tablet (1 mg total) by mouth 3 (three) times daily.   labetalol 200 MG tablet Commonly known as: NORMODYNE Take 1.5 tablets (300 mg total) by mouth 2 (two) times daily. What changed: medication strength   metoCLOPramide 10 MG  tablet Commonly known as: Reglan Take 1 tablet (10 mg total) by mouth 4 (four) times daily.   ondansetron 4 MG disintegrating tablet Commonly known as: ZOFRAN-ODT Take 1-2 tablets (4-8 mg total) by mouth every 6 (six) hours as needed for nausea or vomiting.   pantoprazole 20 MG tablet Commonly known as: Protonix Take 1 tablet (20 mg total) by mouth daily.   PNV-DHA+Docusate 27-1.25-300 MG Caps Take 1 capsule by mouth daily before breakfast.   potassium chloride SA 20 MEQ tablet Commonly known as: KLOR-CON M Take 1 tablet (20 mEq total) by mouth 2 (two) times daily.   promethazine 25 MG tablet Commonly known as: PHENERGAN Take 1 tablet (25 mg total) by mouth every 8 (eight) hours as needed for nausea or vomiting. What changed: Another medication with the same name was added. Make sure you understand how and when to take each.   promethazine 25 MG suppository Commonly known as: PHENERGAN Place 1 suppository (25 mg total) rectally every 6 (six) hours as needed for nausea or vomiting. What changed: You were already taking a medication with the same name, and this prescription was added. Make sure you understand how and when to take each.   scopolamine 1 MG/3DAYS Commonly known as: TRANSDERM-SCOP Place 1 patch (1.5 mg total) onto the skin every 3 (three) days.        Follow-up Information     [facility]   Specialty: Obstetrics and Gynecology Why: keep next scheduled appointment Contact information: [phone]              Total discharge time: 30 minutes  Signed: Donnamae Jude M.D. 01/12/2022, 9:32 AM

## 2022-01-14 ENCOUNTER — Encounter: Payer: Self-pay | Admitting: Obstetrics

## 2022-01-17 ENCOUNTER — Encounter: Payer: Self-pay | Admitting: Obstetrics

## 2022-01-31 ENCOUNTER — Other Ambulatory Visit: Payer: Self-pay

## 2022-01-31 ENCOUNTER — Ambulatory Visit (INDEPENDENT_AMBULATORY_CARE_PROVIDER_SITE_OTHER): Payer: 59 | Admitting: Obstetrics & Gynecology

## 2022-01-31 VITALS — BP 116/74 | HR 82 | Wt 217.4 lb

## 2022-01-31 DIAGNOSIS — O99281 Endocrine, nutritional and metabolic diseases complicating pregnancy, first trimester: Secondary | ICD-10-CM

## 2022-01-31 DIAGNOSIS — Z3491 Encounter for supervision of normal pregnancy, unspecified, first trimester: Secondary | ICD-10-CM

## 2022-01-31 DIAGNOSIS — E0590 Thyrotoxicosis, unspecified without thyrotoxic crisis or storm: Secondary | ICD-10-CM

## 2022-01-31 DIAGNOSIS — O10919 Unspecified pre-existing hypertension complicating pregnancy, unspecified trimester: Secondary | ICD-10-CM

## 2022-01-31 NOTE — Progress Notes (Signed)
? ?  PRENATAL VISIT NOTE ? ?Subjective:  ?Frances Mccormick is a 36 y.o. G3P0020 at 4w4dbeing seen today for ongoing prenatal care.  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 ? ?Patient reports no complaints.  Contractions: Not present. Vag. Bleeding: None.  Movement: Present. Denies leaking of fluid.  ? ?The following portions of the patient's history were reviewed and updated as appropriate: allergies, current medications, past family history, past medical history, past social history, past surgical history and problem list.  ? ?Objective:  ? ?Vitals:  ? 01/31/22 0839  ?BP: 116/74  ?Pulse: 82  ?Weight: 217 lb 6.4 oz (98.6 kg)  ? ? ?Fetal Status:     Movement: Present    ? ?General:  Alert, oriented and cooperative. Patient is in no acute distress.  ?Skin: Skin is warm and dry. No rash noted.   ?Cardiovascular: Normal heart rate noted  ?Respiratory: Normal respiratory effort, no problems with respiration noted  ?Abdomen: Soft, gravid, appropriate for gestational age.  Pain/Pressure: Present     ?Pelvic: Cervical exam deferred        ?Extremities: Normal range of motion.     ?Mental Status: Normal mood and affect. Normal behavior. Normal judgment and thought content.  ? ?Assessment and Plan:  ?Pregnancy: G3P0020 at 159w4d1. Encounter for supervision of normal pregnancy in first trimester, unspecified gravidity ?screening ?- AFP, Serum, Open Spina Bifida ?- USKoreaFM OB DETAIL +14 WK; Future ? ?2. Hyperthyroidism affecting  pregnancy in first trimester ?Likely transient associated with hyperemesis which has resolved ? ?3. Chronic hypertension during pregnancy, antepartum ?No medication now ? ?Preterm labor symptoms and general obstetric precautions including but not limited to vaginal bleeding, contractions, leaking of fluid and fetal movement were reviewed in detail with the patient. ?Please refer to After Visit Summary for other counseling recommendations.  ? ?No follow-ups on file. ? ?Future Appointments  ?Date Time Provider [place]?02/28/2022 10:35 AM DuRadene GunningMD [facility]  ? ? ?JaEmeterio ReeveMD ?

## 2022-02-02 LAB — AFP, SERUM, OPEN SPINA BIFIDA
AFP MoM: 1.68
AFP Value: 47.8 ng/mL
Gest. Age on Collection Date: 15.4 weeks
Maternal Age At EDD: 36.5 yr
OSBR Risk 1 IN: 3427
Test Results:: NEGATIVE
Weight: 217 [lb_av]

## 2022-02-24 ENCOUNTER — Other Ambulatory Visit: Payer: Self-pay

## 2022-02-24 ENCOUNTER — Encounter: Payer: Self-pay | Admitting: *Deleted

## 2022-02-24 ENCOUNTER — Other Ambulatory Visit: Payer: Self-pay | Admitting: *Deleted

## 2022-02-24 ENCOUNTER — Ambulatory Visit: Payer: 59 | Admitting: *Deleted

## 2022-02-24 ENCOUNTER — Ambulatory Visit: Payer: 59 | Attending: Obstetrics & Gynecology

## 2022-02-24 VITALS — BP 118/67 | HR 83

## 2022-02-24 DIAGNOSIS — O99212 Obesity complicating pregnancy, second trimester: Secondary | ICD-10-CM | POA: Insufficient documentation

## 2022-02-24 DIAGNOSIS — Z6839 Body mass index (BMI) 39.0-39.9, adult: Secondary | ICD-10-CM

## 2022-02-24 DIAGNOSIS — O99612 Diseases of the digestive system complicating pregnancy, second trimester: Secondary | ICD-10-CM | POA: Insufficient documentation

## 2022-02-24 DIAGNOSIS — D563 Thalassemia minor: Secondary | ICD-10-CM

## 2022-02-24 DIAGNOSIS — Z3689 Encounter for other specified antenatal screening: Secondary | ICD-10-CM

## 2022-02-24 DIAGNOSIS — Z148 Genetic carrier of other disease: Secondary | ICD-10-CM | POA: Insufficient documentation

## 2022-02-24 DIAGNOSIS — Z3A19 19 weeks gestation of pregnancy: Secondary | ICD-10-CM | POA: Diagnosis not present

## 2022-02-24 DIAGNOSIS — Z3491 Encounter for supervision of normal pregnancy, unspecified, first trimester: Secondary | ICD-10-CM

## 2022-02-24 DIAGNOSIS — Z362 Encounter for other antenatal screening follow-up: Secondary | ICD-10-CM

## 2022-02-24 DIAGNOSIS — O99282 Endocrine, nutritional and metabolic diseases complicating pregnancy, second trimester: Secondary | ICD-10-CM | POA: Diagnosis not present

## 2022-02-24 DIAGNOSIS — E0590 Thyrotoxicosis, unspecified without thyrotoxic crisis or storm: Secondary | ICD-10-CM | POA: Diagnosis not present

## 2022-02-24 DIAGNOSIS — O10912 Unspecified pre-existing hypertension complicating pregnancy, second trimester: Secondary | ICD-10-CM

## 2022-02-24 DIAGNOSIS — O10012 Pre-existing essential hypertension complicating pregnancy, second trimester: Secondary | ICD-10-CM | POA: Diagnosis not present

## 2022-02-25 DIAGNOSIS — D563 Thalassemia minor: Secondary | ICD-10-CM | POA: Insufficient documentation

## 2022-02-26 NOTE — Progress Notes (Signed)
? ?  PRENATAL VISIT NOTE ? ?Subjective:  ?Frances Mccormick is a 36 y.o. G3P0020 at 78w4dbeing seen today for ongoing prenatal care.  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 ? ?Patient reports no complaints.  Contractions: Not present. Vag. Bleeding: None.  Movement: Present. Denies leaking of fluid.  ? ?The following portions of the patient's history were reviewed and updated as appropriate: allergies, current medications, past family history, past medical history, past social history, past surgical history and problem list.  ? ?Objective:  ? ?Vitals:  ? 02/28/22 1029  ?BP: 108/74  ?Pulse: 76  ?Weight: 214 lb (97.1 kg)  ? ? ?Fetal Status: Fetal Heart Rate (bpm): 145   Movement: Present    ? ?General:  Alert, oriented and cooperative. Patient is in no acute distress.  ?Skin: Skin is warm and dry. No rash noted.   ?Cardiovascular: Normal heart rate noted  ?Respiratory: Normal respiratory effort, no problems with respiration noted  ?Abdomen: Soft, gravid, appropriate for gestational age.  Pain/Pressure: Present     ?Pelvic: Cervical exam deferred        ?Extremities: Normal range of motion.  Edema: Trace  ?Mental Status: Normal mood and affect. Normal behavior. Normal judgment and thought content.  ? ?Assessment and Plan:  ?Pregnancy: G3P0020 at 148w4d1. 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 ?2. Encounter for supervision of normal pregnancy in first trimester, unspecified gravidity ?- AFP wnl ?-  Normal but incomplete anatomy. Has f/u on 4/25.  ? ?3. Alpha thalassemia silent carrier ?- Discussed option for partner testing - she would like partner kit and will bring it back.  ? ?4. Chronic hypertension during pregnancy, antepartum ?- No meds currently ? ? ?Preterm labor symptoms and general obstetric precautions including but not limited to vaginal bleeding, contractions, leaking of fluid and fetal movement were reviewed in detail with the patient. ?Please refer to After Visit Summary for other counseling recommendations.  ? ?No follow-ups on file. ? ?Future Appointments  ?Date Time Provider [place]?03/25/2022  7:45 AM [facility] NURSE [facility]  ?03/25/2022  8:00 AM [facility] US1 [facility]  ? ? ?PaRadene GunningMD ?

## 2022-02-28 ENCOUNTER — Ambulatory Visit (INDEPENDENT_AMBULATORY_CARE_PROVIDER_SITE_OTHER): Payer: 59 | Admitting: Obstetrics and Gynecology

## 2022-02-28 ENCOUNTER — Encounter: Payer: Self-pay | Admitting: Obstetrics and Gynecology

## 2022-02-28 VITALS — BP 108/74 | HR 76 | Wt 214.0 lb

## 2022-02-28 DIAGNOSIS — O10919 Unspecified pre-existing hypertension complicating pregnancy, unspecified trimester: Secondary | ICD-10-CM

## 2022-02-28 DIAGNOSIS — E0590 Thyrotoxicosis, unspecified without thyrotoxic crisis or storm: Secondary | ICD-10-CM

## 2022-02-28 DIAGNOSIS — Z3491 Encounter for supervision of normal pregnancy, unspecified, first trimester: Secondary | ICD-10-CM

## 2022-02-28 DIAGNOSIS — O99281 Endocrine, nutritional and metabolic diseases complicating pregnancy, first trimester: Secondary | ICD-10-CM

## 2022-02-28 DIAGNOSIS — D563 Thalassemia minor: Secondary | ICD-10-CM

## 2022-03-01 LAB — T4, FREE: Free T4: 1.1 ng/dL (ref 0.82–1.77)

## 2022-03-01 LAB — TSH: TSH: 1.32 u[IU]/mL (ref 0.450–4.500)

## 2022-03-04 ENCOUNTER — Other Ambulatory Visit: Payer: Self-pay

## 2022-03-04 ENCOUNTER — Inpatient Hospital Stay (HOSPITAL_BASED_OUTPATIENT_CLINIC_OR_DEPARTMENT_OTHER): Payer: 59

## 2022-03-04 ENCOUNTER — Emergency Department (HOSPITAL_COMMUNITY): Payer: 59

## 2022-03-04 ENCOUNTER — Encounter (HOSPITAL_COMMUNITY): Payer: Self-pay

## 2022-03-04 ENCOUNTER — Inpatient Hospital Stay (HOSPITAL_COMMUNITY)
Admission: EM | Admit: 2022-03-04 | Discharge: 2022-03-04 | Disposition: A | Discharge status: Home / Self Care | Payer: 59 | Attending: Family Medicine | Admitting: Family Medicine

## 2022-03-04 DIAGNOSIS — R1084 Generalized abdominal pain: Secondary | ICD-10-CM | POA: Diagnosis not present

## 2022-03-04 DIAGNOSIS — O26892 Other specified pregnancy related conditions, second trimester: Secondary | ICD-10-CM | POA: Insufficient documentation

## 2022-03-04 DIAGNOSIS — O99281 Endocrine, nutritional and metabolic diseases complicating pregnancy, first trimester: Secondary | ICD-10-CM

## 2022-03-04 DIAGNOSIS — Z3A20 20 weeks gestation of pregnancy: Secondary | ICD-10-CM

## 2022-03-04 DIAGNOSIS — O9A212 Injury, poisoning and certain other consequences of external causes complicating pregnancy, second trimester: Secondary | ICD-10-CM

## 2022-03-04 DIAGNOSIS — O321XX0 Maternal care for breech presentation, not applicable or unspecified: Secondary | ICD-10-CM

## 2022-03-04 DIAGNOSIS — S298XXA Other specified injuries of thorax, initial encounter: Secondary | ICD-10-CM

## 2022-03-04 DIAGNOSIS — Y92410 Unspecified street and highway as the place of occurrence of the external cause: Secondary | ICD-10-CM | POA: Insufficient documentation

## 2022-03-04 DIAGNOSIS — T1490XA Injury, unspecified, initial encounter: Secondary | ICD-10-CM | POA: Diagnosis not present

## 2022-03-04 DIAGNOSIS — O99891 Other specified diseases and conditions complicating pregnancy: Secondary | ICD-10-CM

## 2022-03-04 DIAGNOSIS — R109 Unspecified abdominal pain: Secondary | ICD-10-CM

## 2022-03-04 DIAGNOSIS — Z3491 Encounter for supervision of normal pregnancy, unspecified, first trimester: Secondary | ICD-10-CM

## 2022-03-04 DIAGNOSIS — S3991XA Unspecified injury of abdomen, initial encounter: Secondary | ICD-10-CM | POA: Insufficient documentation

## 2022-03-04 MED ORDER — CYCLOBENZAPRINE HCL 5 MG PO TABS: 5.0000 mg | ORAL_TABLET | Freq: Three times a day (TID) | ORAL | 0 refills | Status: DC | PRN

## 2022-03-04 NOTE — Progress Notes (Signed)
WRitten and verbal d/c instructions given by Gavin Pound CNM. PT then d/c home  ?
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
## 2022-03-04 NOTE — MAU Note (Signed)
Gavin Pound CNM in Family Rm to see pt ?
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
## 2022-03-25 ENCOUNTER — Encounter: Payer: Self-pay | Admitting: *Deleted

## 2022-03-25 ENCOUNTER — Ambulatory Visit: Payer: 59 | Admitting: *Deleted

## 2022-03-25 ENCOUNTER — Ambulatory Visit: Payer: 59 | Attending: Obstetrics

## 2022-03-25 ENCOUNTER — Other Ambulatory Visit: Payer: Self-pay | Admitting: *Deleted

## 2022-03-25 VITALS — BP 121/70 | HR 83

## 2022-03-25 DIAGNOSIS — Z6839 Body mass index (BMI) 39.0-39.9, adult: Secondary | ICD-10-CM

## 2022-03-25 DIAGNOSIS — O99212 Obesity complicating pregnancy, second trimester: Secondary | ICD-10-CM | POA: Diagnosis not present

## 2022-03-25 DIAGNOSIS — O10912 Unspecified pre-existing hypertension complicating pregnancy, second trimester: Secondary | ICD-10-CM | POA: Insufficient documentation

## 2022-03-25 DIAGNOSIS — Z362 Encounter for other antenatal screening follow-up: Secondary | ICD-10-CM | POA: Insufficient documentation

## 2022-03-25 DIAGNOSIS — O09522 Supervision of elderly multigravida, second trimester: Secondary | ICD-10-CM

## 2022-03-25 DIAGNOSIS — O10919 Unspecified pre-existing hypertension complicating pregnancy, unspecified trimester: Secondary | ICD-10-CM

## 2022-03-25 DIAGNOSIS — O321XX0 Maternal care for breech presentation, not applicable or unspecified: Secondary | ICD-10-CM | POA: Diagnosis not present

## 2022-03-25 DIAGNOSIS — Z3A23 23 weeks gestation of pregnancy: Secondary | ICD-10-CM | POA: Insufficient documentation

## 2022-03-25 DIAGNOSIS — K50919 Crohn's disease, unspecified, with unspecified complications: Secondary | ICD-10-CM

## 2022-03-25 DIAGNOSIS — E0590 Thyrotoxicosis, unspecified without thyrotoxic crisis or storm: Secondary | ICD-10-CM

## 2022-03-25 DIAGNOSIS — K5090 Crohn's disease, unspecified, without complications: Secondary | ICD-10-CM | POA: Diagnosis not present

## 2022-03-25 DIAGNOSIS — O99282 Endocrine, nutritional and metabolic diseases complicating pregnancy, second trimester: Secondary | ICD-10-CM | POA: Diagnosis not present

## 2022-03-25 DIAGNOSIS — O99281 Endocrine, nutritional and metabolic diseases complicating pregnancy, first trimester: Secondary | ICD-10-CM | POA: Insufficient documentation

## 2022-03-25 DIAGNOSIS — Z148 Genetic carrier of other disease: Secondary | ICD-10-CM | POA: Insufficient documentation

## 2022-03-25 DIAGNOSIS — O99612 Diseases of the digestive system complicating pregnancy, second trimester: Secondary | ICD-10-CM | POA: Insufficient documentation

## 2022-03-25 DIAGNOSIS — O99280 Endocrine, nutritional and metabolic diseases complicating pregnancy, unspecified trimester: Secondary | ICD-10-CM

## 2022-03-25 DIAGNOSIS — O285 Abnormal chromosomal and genetic finding on antenatal screening of mother: Secondary | ICD-10-CM

## 2022-03-25 DIAGNOSIS — D259 Leiomyoma of uterus, unspecified: Secondary | ICD-10-CM

## 2022-03-25 DIAGNOSIS — Z3491 Encounter for supervision of normal pregnancy, unspecified, first trimester: Secondary | ICD-10-CM | POA: Insufficient documentation

## 2022-03-25 DIAGNOSIS — O10012 Pre-existing essential hypertension complicating pregnancy, second trimester: Secondary | ICD-10-CM | POA: Diagnosis not present

## 2022-03-25 DIAGNOSIS — E669 Obesity, unspecified: Secondary | ICD-10-CM

## 2022-03-25 DIAGNOSIS — D563 Thalassemia minor: Secondary | ICD-10-CM

## 2022-03-28 ENCOUNTER — Ambulatory Visit (INDEPENDENT_AMBULATORY_CARE_PROVIDER_SITE_OTHER): Payer: 59

## 2022-03-28 ENCOUNTER — Encounter: Payer: Self-pay | Admitting: Obstetrics

## 2022-03-28 VITALS — BP 117/75 | HR 84 | Wt 214.0 lb

## 2022-03-28 DIAGNOSIS — Z3A23 23 weeks gestation of pregnancy: Secondary | ICD-10-CM

## 2022-03-28 DIAGNOSIS — O10912 Unspecified pre-existing hypertension complicating pregnancy, second trimester: Secondary | ICD-10-CM

## 2022-03-28 DIAGNOSIS — O10919 Unspecified pre-existing hypertension complicating pregnancy, unspecified trimester: Secondary | ICD-10-CM

## 2022-03-28 DIAGNOSIS — Z3482 Encounter for supervision of other normal pregnancy, second trimester: Secondary | ICD-10-CM

## 2022-03-28 NOTE — Progress Notes (Signed)
ROB, Pt wants to know about baby being in the breech position. ?

## 2022-03-28 NOTE — Progress Notes (Signed)
? ?HIGH-RISK PREGNANCY OFFICE VISIT ? ?Patient name: Frances Mccormick MRN [id number]  Date of birth: November 02, 1986 ?Chief Complaint:   ?Routine Prenatal Visit ? ?Subjective:   ?Frances Mccormick is a 36 y.o. 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 ? ?Patient presents today, alone, with no complaints. She reports completion of anatomy scan and is concerned with breech presentation.  Patient endorses fetal movement. Patient reports some intermittent mild abdominal cramping or contractions.  Patient denies vaginal concerns including abnormal discharge, leaking of fluid, and bleeding.  No issues with urination, constipation, or diarrhea. No HA or visual disturbances.  ?Contractions: Not present. Vag. Bleeding: None.  Movement: Present. ? ?Reviewed past medical,surgical, social, obstetrical and family history as well as problem list, medications and allergies. ? ?Objective  ? ?Vitals:  ? 03/28/22 1008  ?BP: 117/75  ?Pulse: 84  ?Weight: 214 lb (97.1 kg)  ?Body mass index is 41.79 kg/m?.  ?Total Weight Gain:13 lb (5.897 kg) ? ?  ?     Physical Examination:  ? General appearance: Well appearing, and in no distress ? Mental status: Alert, oriented to person, place, and time ? Skin: Warm & dry ? Cardiovascular: Normal heart rate noted ? Respiratory: Normal respiratory effort, no distress ? Abdomen: Soft, gravid, nontender, AGA with Fundus at +4/U ? Pelvic: Cervical exam deferred          ? Extremities: Edema: Mild pitting, slight  indentation ? ?Fetal Status: Fetal Heart Rate (bpm): 151  Movement: Present  ? ?No results found for this or any previous visit (from the past 24 hour(s)).  ?Assessment & Plan:  ?High-risk pregnancy of a 36y.o., G3P0020 at 269w4dith an Estimated Date of Delivery: 07/21/22  ? ?1. Encounter for supervision of other normal pregnancy in second trimester ?-Anticipatory guidance for upcoming appts. ?-Patient to schedule next appt in 4 weeks for an in-person visit. ?-Reviewed Glucola appt preparation including fasting the night before and morning of.   ?*Informed that it is okay to drink plain water throughout the night and prior to consumption of Glucola formula.  ?-Discussed anticipated office time of 2.5-3 hours.  ?-Reviewed blood draw procedures and labs which also include check of iron/HgB level, RPR, and HIV ?*Informed that repeat RPR/HIV are for pediatric records/compliance.  ?-Discussed how results of GTT are handled including diabetic education and BS testing for abnormal results and routine care for normal results.  ? ?2. Chronic hypertension affecting pregnancy ?-BP normotensive today. ?-No current medications. ? ?3. [redacted] weeks gestation of pregnancy ?-Doing well. ?-Reviewed concern regarding fetal position. ?-Reassured normal and will continue to monitor. ? ? ? ?  ?Meds: No orders of the defined types were placed in this encounter. ? ?Labs/procedures today:  ?Lab Orders  ?No laboratory test(s) ordered today  ?  ? ?Reviewed: Preterm labor symptoms and general obstetric precautions including but not limited to vaginal bleeding, contractions, leaking of fluid and fetal movement were reviewed in detail with the patient.  All questions were answered. ? ?Follow-up: No follow-ups on file. ? ?No orders of the defined types were placed in this encounter. ? ?JeMaryann ConnersSN, CNM ?03/28/2022 ? ? ?

## 2022-04-22 ENCOUNTER — Ambulatory Visit: Payer: 59 | Attending: Obstetrics

## 2022-04-22 ENCOUNTER — Ambulatory Visit: Payer: 59 | Admitting: *Deleted

## 2022-04-22 ENCOUNTER — Encounter: Payer: Self-pay | Admitting: *Deleted

## 2022-04-22 ENCOUNTER — Other Ambulatory Visit: Payer: Self-pay | Admitting: *Deleted

## 2022-04-22 VITALS — BP 113/71 | HR 82

## 2022-04-22 DIAGNOSIS — K50919 Crohn's disease, unspecified, with unspecified complications: Secondary | ICD-10-CM | POA: Diagnosis not present

## 2022-04-22 DIAGNOSIS — E0590 Thyrotoxicosis, unspecified without thyrotoxic crisis or storm: Secondary | ICD-10-CM

## 2022-04-22 DIAGNOSIS — K5090 Crohn's disease, unspecified, without complications: Secondary | ICD-10-CM

## 2022-04-22 DIAGNOSIS — O10919 Unspecified pre-existing hypertension complicating pregnancy, unspecified trimester: Secondary | ICD-10-CM | POA: Insufficient documentation

## 2022-04-22 DIAGNOSIS — O09522 Supervision of elderly multigravida, second trimester: Secondary | ICD-10-CM | POA: Insufficient documentation

## 2022-04-22 DIAGNOSIS — O3410 Maternal care for benign tumor of corpus uteri, unspecified trimester: Secondary | ICD-10-CM | POA: Diagnosis present

## 2022-04-22 DIAGNOSIS — O99612 Diseases of the digestive system complicating pregnancy, second trimester: Secondary | ICD-10-CM

## 2022-04-22 DIAGNOSIS — O99281 Endocrine, nutritional and metabolic diseases complicating pregnancy, first trimester: Secondary | ICD-10-CM | POA: Insufficient documentation

## 2022-04-22 DIAGNOSIS — D259 Leiomyoma of uterus, unspecified: Secondary | ICD-10-CM | POA: Diagnosis present

## 2022-04-22 DIAGNOSIS — O10012 Pre-existing essential hypertension complicating pregnancy, second trimester: Secondary | ICD-10-CM

## 2022-04-22 DIAGNOSIS — Z6839 Body mass index (BMI) 39.0-39.9, adult: Secondary | ICD-10-CM | POA: Insufficient documentation

## 2022-04-22 DIAGNOSIS — E669 Obesity, unspecified: Secondary | ICD-10-CM

## 2022-04-22 DIAGNOSIS — O99283 Endocrine, nutritional and metabolic diseases complicating pregnancy, third trimester: Secondary | ICD-10-CM

## 2022-04-22 DIAGNOSIS — O99282 Endocrine, nutritional and metabolic diseases complicating pregnancy, second trimester: Secondary | ICD-10-CM | POA: Insufficient documentation

## 2022-04-22 DIAGNOSIS — Z3A27 27 weeks gestation of pregnancy: Secondary | ICD-10-CM

## 2022-04-22 DIAGNOSIS — O99212 Obesity complicating pregnancy, second trimester: Secondary | ICD-10-CM

## 2022-04-22 DIAGNOSIS — O09523 Supervision of elderly multigravida, third trimester: Secondary | ICD-10-CM

## 2022-04-25 ENCOUNTER — Other Ambulatory Visit: Payer: 59

## 2022-04-25 ENCOUNTER — Ambulatory Visit (INDEPENDENT_AMBULATORY_CARE_PROVIDER_SITE_OTHER): Payer: 59 | Admitting: Obstetrics and Gynecology

## 2022-04-25 VITALS — BP 116/75 | HR 82 | Wt 220.0 lb

## 2022-04-25 DIAGNOSIS — Z23 Encounter for immunization: Secondary | ICD-10-CM

## 2022-04-25 DIAGNOSIS — K50919 Crohn's disease, unspecified, with unspecified complications: Secondary | ICD-10-CM

## 2022-04-25 DIAGNOSIS — O10919 Unspecified pre-existing hypertension complicating pregnancy, unspecified trimester: Secondary | ICD-10-CM

## 2022-04-25 DIAGNOSIS — E0590 Thyrotoxicosis, unspecified without thyrotoxic crisis or storm: Secondary | ICD-10-CM

## 2022-04-25 DIAGNOSIS — Z3491 Encounter for supervision of normal pregnancy, unspecified, first trimester: Secondary | ICD-10-CM | POA: Diagnosis not present

## 2022-04-25 DIAGNOSIS — O09522 Supervision of elderly multigravida, second trimester: Secondary | ICD-10-CM

## 2022-04-25 DIAGNOSIS — O99281 Endocrine, nutritional and metabolic diseases complicating pregnancy, first trimester: Secondary | ICD-10-CM

## 2022-04-25 DIAGNOSIS — Z3A27 27 weeks gestation of pregnancy: Secondary | ICD-10-CM

## 2022-04-25 DIAGNOSIS — D563 Thalassemia minor: Secondary | ICD-10-CM

## 2022-04-25 HISTORY — DX: Supervision of elderly multigravida, second trimester: O09.522

## 2022-04-25 NOTE — Progress Notes (Signed)
   PRENATAL VISIT NOTE  Subjective:  Frances Mccormick is a 36 y.o. G3P0020 at 86w4dbeing seen today for ongoing prenatal care.  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  Patient doing well with no acute concerns today. She reports no complaints.  Contractions: Not present. Vag. Bleeding: None.  Movement: Present. Denies leaking of fluid.   The following portions of the patient's history were reviewed and updated as appropriate: allergies, current medications, past family history, past medical history, past social history, past surgical history and problem list. Problem list updated.  Objective:   Vitals:   04/25/22 0840  BP: 116/75  Pulse: 82  Weight: 220 lb (99.8 kg)    Fetal Status: Fetal Heart Rate (bpm): 150 Fundal Height: 28 cm Movement: Present     General:  Alert, oriented and cooperative. Patient is in no acute distress.  Skin: Skin is warm and dry. No rash noted.   Cardiovascular: Normal heart rate noted  Respiratory: Normal respiratory effort, no problems with respiration noted  Abdomen: Soft, gravid, appropriate for gestational age.  Pain/Pressure: Absent     Pelvic: Cervical exam deferred        Extremities: Normal range of motion.  Edema: Trace  Mental Status:  Normal mood and affect. Normal behavior. Normal judgment and thought content.   Assessment and Plan:  Pregnancy: [id number] 223w4d1. Encounter for supervision of normal pregnancy in first trimester,  unspecified gravidity Continue routine prenatal care  - Glucose Tolerance, 2 Hours w/1 Hour - RPR - CBC - HIV antibody (with reflex) - Tdap vaccine greater than or equal to 7yo IM  2. [redacted] weeks gestation of pregnancy   3. Chronic hypertension during pregnancy, antepartum BP WNL today, no meds currently  4. Crohn's disease with complication, unspecified gastrointestinal tract location (HCDaly CityPt notes no current GI issues  5. Alpha thalassemia silent carrier   6. 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 Advanced maternal age in multigravida, second trimester Pt has repeat growth scan 05/27/22  Preterm labor symptoms and general obstetric precautions including but not limited to vaginal bleeding, contractions, leaking of fluid and fetal movement were reviewed in detail with the patient.  Please refer to After Visit Summary for other counseling recommendations.   Return in about 2 weeks (around 05/09/2022) [facility].   Frances ShieldsMD Faculty Attending Center for [facility]

## 2022-04-25 NOTE — Progress Notes (Signed)
ROB/GTT.  TDap given in LD, tolerated well.

## 2022-04-26 LAB — CBC
Hematocrit: 35 % (ref 34.0–46.6)
Hemoglobin: 11.6 g/dL (ref 11.1–15.9)
MCH: 25.8 pg — ABNORMAL LOW (ref 26.6–33.0)
MCHC: 33.1 g/dL (ref 31.5–35.7)
MCV: 78 fL — ABNORMAL LOW (ref 79–97)
Platelets: 416 10*3/uL (ref 150–450)
RBC: 4.49 x10E6/uL (ref 3.77–5.28)
RDW: 13.4 % (ref 11.7–15.4)
WBC: 14.3 10*3/uL — ABNORMAL HIGH (ref 3.4–10.8)

## 2022-04-26 LAB — GLUCOSE TOLERANCE, 2 HOURS W/ 1HR
Glucose, 1 hour: 137 mg/dL (ref 70–179)
Glucose, 2 hour: 115 mg/dL (ref 70–152)
Glucose, Fasting: 79 mg/dL (ref 70–91)

## 2022-04-26 LAB — HIV ANTIBODY (ROUTINE TESTING W REFLEX): HIV Screen 4th Generation wRfx: NONREACTIVE

## 2022-04-26 LAB — RPR: RPR Ser Ql: NONREACTIVE

## 2022-05-13 ENCOUNTER — Ambulatory Visit (INDEPENDENT_AMBULATORY_CARE_PROVIDER_SITE_OTHER): Payer: 59 | Admitting: Obstetrics & Gynecology

## 2022-05-13 DIAGNOSIS — Z3A30 30 weeks gestation of pregnancy: Secondary | ICD-10-CM

## 2022-05-13 DIAGNOSIS — Z3491 Encounter for supervision of normal pregnancy, unspecified, first trimester: Secondary | ICD-10-CM

## 2022-05-13 NOTE — Progress Notes (Signed)
   PRENATAL VISIT NOTE  Subjective:  Frances Mccormick is a 36 y.o. G3P0020 at 35w1dbeing seen today for ongoing prenatal care.  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  Patient reports no complaints.  Contractions: Not present. Vag. Bleeding: None.  Movement: Present. Denies leaking of fluid.   The following portions of the patient's history were reviewed and updated as appropriate: allergies, current medications, past family history, past medical history, past social history, past surgical history and problem list.   Objective:   Vitals:   05/13/22 1117  BP: 111/75  Pulse: 90  Weight: 218 lb (98.9 kg)    Fetal Status: Fetal Heart Rate (bpm): 155   Movement: Present     General:  Alert, oriented and cooperative. Patient is in no acute distress.  Skin: Skin is warm and dry. No rash noted.   Cardiovascular: Normal heart rate noted  Respiratory: Normal respiratory effort, no problems with respiration noted  Abdomen: Soft, gravid, appropriate for gestational age.  Pain/Pressure: Absent     Pelvic: Cervical exam deferred        Extremities: Normal range of motion.     Mental Status: Normal mood and affect. Normal behavior. Normal judgment and thought content.   Assessment and Plan:  Pregnancy: GV6P2244at 371w1d. Encounter for supervision of normal pregnancy in first trimester, unspecified gravidity F/u [place] in 2-3 weeks  Preterm labor symptoms and general obstetric  precautions including but not limited to vaginal bleeding, contractions, leaking of fluid and fetal movement were reviewed in detail with the patient. Please refer to After Visit Summary for other counseling recommendations.   Return in about 2 weeks (around 05/27/2022).  Future Appointments  Date Time Provider DeFall [place]6/27/2023  7:45 AM [facility] NURSE [facility]6/27/2023  8:00 AM [facility] US1 [facility]6/27/2023  4:10 PM FoJohnston EbbsNP [facility] None    JaEmeterio ReeveMD

## 2022-05-27 ENCOUNTER — Encounter: Payer: Self-pay | Admitting: *Deleted

## 2022-05-27 ENCOUNTER — Ambulatory Visit (INDEPENDENT_AMBULATORY_CARE_PROVIDER_SITE_OTHER): Payer: 59 | Admitting: Student

## 2022-05-27 ENCOUNTER — Other Ambulatory Visit: Payer: Self-pay | Admitting: *Deleted

## 2022-05-27 ENCOUNTER — Ambulatory Visit: Payer: 59 | Admitting: *Deleted

## 2022-05-27 ENCOUNTER — Ambulatory Visit: Payer: 59 | Attending: Obstetrics and Gynecology

## 2022-05-27 VITALS — BP 109/74 | HR 83 | Wt 223.2 lb

## 2022-05-27 VITALS — BP 111/65 | HR 84

## 2022-05-27 DIAGNOSIS — Z3491 Encounter for supervision of normal pregnancy, unspecified, first trimester: Secondary | ICD-10-CM

## 2022-05-27 DIAGNOSIS — O09523 Supervision of elderly multigravida, third trimester: Secondary | ICD-10-CM

## 2022-05-27 DIAGNOSIS — Z148 Genetic carrier of other disease: Secondary | ICD-10-CM | POA: Insufficient documentation

## 2022-05-27 DIAGNOSIS — O10919 Unspecified pre-existing hypertension complicating pregnancy, unspecified trimester: Secondary | ICD-10-CM | POA: Insufficient documentation

## 2022-05-27 DIAGNOSIS — O99283 Endocrine, nutritional and metabolic diseases complicating pregnancy, third trimester: Secondary | ICD-10-CM

## 2022-05-27 DIAGNOSIS — E0590 Thyrotoxicosis, unspecified without thyrotoxic crisis or storm: Secondary | ICD-10-CM | POA: Insufficient documentation

## 2022-05-27 DIAGNOSIS — K5090 Crohn's disease, unspecified, without complications: Secondary | ICD-10-CM

## 2022-05-27 DIAGNOSIS — Z6839 Body mass index (BMI) 39.0-39.9, adult: Secondary | ICD-10-CM

## 2022-05-27 DIAGNOSIS — O10013 Pre-existing essential hypertension complicating pregnancy, third trimester: Secondary | ICD-10-CM | POA: Diagnosis not present

## 2022-05-27 DIAGNOSIS — Z6841 Body Mass Index (BMI) 40.0 and over, adult: Secondary | ICD-10-CM

## 2022-05-27 DIAGNOSIS — K50919 Crohn's disease, unspecified, with unspecified complications: Secondary | ICD-10-CM

## 2022-05-27 DIAGNOSIS — Z3A32 32 weeks gestation of pregnancy: Secondary | ICD-10-CM | POA: Diagnosis not present

## 2022-05-27 DIAGNOSIS — O99613 Diseases of the digestive system complicating pregnancy, third trimester: Secondary | ICD-10-CM | POA: Insufficient documentation

## 2022-05-27 DIAGNOSIS — O99281 Endocrine, nutritional and metabolic diseases complicating pregnancy, first trimester: Secondary | ICD-10-CM | POA: Insufficient documentation

## 2022-05-27 DIAGNOSIS — O99213 Obesity complicating pregnancy, third trimester: Secondary | ICD-10-CM

## 2022-05-27 NOTE — Progress Notes (Signed)
Patient presents for ROB.  Pt reports heart "flutters" and increased heart rate. Denies chest pain or shortness of breath.

## 2022-05-28 NOTE — Progress Notes (Signed)
   PRENATAL VISIT NOTE  Subjective:  Frances Mccormick is a 36 y.o. G3P0020 at 54w2dbeing seen today for ongoing prenatal care.  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  Patient reports no complaints.  Contractions: Irritability. Vag. Bleeding: None.  Movement: Present. Denies leaking of fluid.   The following portions of the patient's history were reviewed and updated as appropriate: allergies, current medications, past family history, past medical history, past social history, past surgical history and problem list.   Objective:   Vitals:   05/27/22 1624  BP: 109/74  Pulse: 83  Weight: 223 lb 3.2 oz (101.2 kg)    Fetal Status: Fetal Heart Rate (bpm): 150 Fundal Height: 32 cm Movement: Present     General:  Alert, oriented and cooperative. Patient is in no acute distress.  Skin: Skin is warm and dry. No rash noted.   Cardiovascular: Normal heart rate noted  Respiratory: Normal respiratory effort, no problems with respiration noted  Abdomen: Soft, gravid, appropriate for gestational age.  Pain/Pressure: Absent     Pelvic: Cervical exam deferred        Extremities: Normal range of motion.  Edema: Trace  Mental Status: Normal mood and affect. Normal behavior. Normal judgment and thought content.   Assessment and Plan:  Pregnancy: [id number] 320w2d. Encounter for supervision of normal pregnancy in first trimester, unspecified gravidity - Doing well  2. [redacted] weeks gestation of  pregnancy -routine prenatal f/u visits  3. Chronic hypertension during pregnancy, antepartum -Normal BP today -f/u growth scan scheduled  4. Advanced maternal age in multigravida, third trimester -f/u growth scan scheduled  5. Class 3 severe obesity with body mass index (BMI) of 40.0 to 44.9 in adult, unspecified obesity type, unspecified whether serious comorbidity present (HCSouth [place]ull Lake-f/u growth scan scheduled   Preterm labor symptoms and general obstetric precautions including but not limited to vaginal bleeding, contractions, leaking of fluid and fetal movement were reviewed in detail with the patient. Please refer to After Visit Summary for other counseling recommendations.   Return in about 4 weeks (around 06/24/2022) for HOB/GBS, IN-PERSON.  Future Appointments  Date Time Provider De[place]7/25/2023  1:30 PM [facility]/25/2023  1:45 PM [facility] US6 [facility]7/25/2023  4:10 PM StTruett MainlandDO [facility] None    NiJohnston EbbsNP

## 2022-06-16 ENCOUNTER — Other Ambulatory Visit: Payer: Self-pay

## 2022-06-16 ENCOUNTER — Emergency Department (HOSPITAL_COMMUNITY)
Admission: EM | Admit: 2022-06-16 | Discharge: 2022-06-16 | Disposition: A | Discharge status: Home / Self Care | Payer: 59 | Source: Home / Self Care | Attending: Emergency Medicine | Admitting: Emergency Medicine

## 2022-06-16 ENCOUNTER — Encounter (HOSPITAL_COMMUNITY): Payer: Self-pay

## 2022-06-16 DIAGNOSIS — N939 Abnormal uterine and vaginal bleeding, unspecified: Secondary | ICD-10-CM | POA: Diagnosis not present

## 2022-06-16 DIAGNOSIS — O42013 Preterm premature rupture of membranes, onset of labor within 24 hours of rupture, third trimester: Secondary | ICD-10-CM | POA: Diagnosis not present

## 2022-06-16 DIAGNOSIS — O42913 Preterm premature rupture of membranes, unspecified as to length of time between rupture and onset of labor, third trimester: Secondary | ICD-10-CM | POA: Diagnosis not present

## 2022-06-16 DIAGNOSIS — L02416 Cutaneous abscess of left lower limb: Secondary | ICD-10-CM | POA: Insufficient documentation

## 2022-06-16 DIAGNOSIS — L732 Hidradenitis suppurativa: Secondary | ICD-10-CM | POA: Insufficient documentation

## 2022-06-16 DIAGNOSIS — Z3A35 35 weeks gestation of pregnancy: Secondary | ICD-10-CM | POA: Diagnosis not present

## 2022-06-16 DIAGNOSIS — L02419 Cutaneous abscess of limb, unspecified: Secondary | ICD-10-CM

## 2022-06-16 DIAGNOSIS — O4703 False labor before 37 completed weeks of gestation, third trimester: Secondary | ICD-10-CM | POA: Diagnosis not present

## 2022-06-16 HISTORY — DX: Hidradenitis suppurativa: L73.2

## 2022-06-16 MED ORDER — CEPHALEXIN 500 MG PO CAPS: 500.0000 mg | ORAL_CAPSULE | Freq: Four times a day (QID) | ORAL | 0 refills | Status: DC

## 2022-06-16 MED ORDER — LIDOCAINE-EPINEPHRINE 2 %-1:100000 IJ SOLN
20.0000 mL | Freq: Once | INTRAMUSCULAR | Status: AC
Start: 2022-06-16 — End: 2022-06-16
  Administered 2022-06-16: 5 mL
  Filled 2022-06-16: qty 1

## 2022-06-16 NOTE — ED Provider Triage Note (Signed)
Emergency Medicine Provider Triage Evaluation Note  Frances Mccormick , a 36 y.o. female  was evaluated in triage.  Pt complains of abscess on inner left thigh x 4 days. Has tried warm compresses but area is getting worse. Hx of HS, usually gets abscesses in her axilla. Once had pilonidal abscess and was admitted, alongside other problems. Is about [redacted] weeks pregnant.   Review of Systems  Positive: Abscess  Negative: Fever, chills  Physical Exam  BP (!) 147/97 (BP Location: Left Arm)   Pulse 93   Temp 97.8 F (36.6 C) (Oral)   Resp 18   Ht 5' (1.524 m)   Wt 100.2 kg   LMP 10/14/2021 (Exact Date)   SpO2 95%   BMI 43.16 kg/m  Gen:   Awake, no distress   Resp:  Normal effort  MSK:   Moves extremities without difficulty  Other:    Medical Decision Making  Medically screening exam initiated at 7:28 PM.  Appropriate orders placed.  ELZENA MUSTON was informed that the remainder of the evaluation will be completed by another provider, this initial triage assessment does not replace that evaluation, and the importance of remaining in the ED until their evaluation is complete.     Kateri Plummer, Hershal Coria 06/16/22 1930

## 2022-06-16 NOTE — ED Triage Notes (Signed)
Pt has an abscess to her left inner thigh since Friday. Hx of HS.

## 2022-06-16 NOTE — Discharge Instructions (Addendum)
You were seen in the emergency department for an abscess.  We have drained the area and cleaned it. I would like you to have the wound checked in 2-3 days. This can be done by any doctor's office, urgent care, or emergency department. This is to make sure the area hasn't closed too soon. Try to keep the area as clean and dry as possible. It is okay to let warm soapy water run over the area, but do NOT scrub the area.   I am placing you on a course of antibiotics. It is important you finish the entire course! You can take ibuprofen or tylenol as needed for pain.   Continue to monitor how you're doing and return to the ER for new or worsening symptoms.

## 2022-06-16 NOTE — ED Notes (Signed)
I provided reinforced discharge education based off of discharge summary/care provided. Pt acknowledged and understood my education. Pt had no further questions/concerns for provider/myself.

## 2022-06-17 NOTE — ED Provider Notes (Addendum)
[facility] DEPT Provider Note   CSN: [id number] Arrival date & time: 06/16/22  1849     History  Chief Complaint  Patient presents with   Abscess    Frances Mccormick is a 36 y.o. female who complains of abscess on inner left thigh x 4 days. Has tried warm compresses but area is getting worse. Hx of HS, usually gets abscesses in her axilla. Once had pilonidal abscess and was admitted, alongside other problems. Is about [redacted] weeks pregnant. Denies fever or systemic symptoms.    Abscess Associated symptoms: no fever        Home Medications Prior to Admission medications   Medication Sig Start Date End Date Taking? Authorizing Provider  cephALEXin (KEFLEX) 500 MG capsule Take 1 capsule (500 mg total) by mouth 4 (four) times daily. 06/16/22  Yes Oriyah Lamphear T, PA-C  cyclobenzaprine (FLEXERIL) 5 MG tablet Take 1-2 tablets (5-10 mg total) by mouth 3 (three) times daily as needed for muscle spasms. Patient not taking: Reported on 05/13/2022 03/04/22   Gavin Pound, CNM  Prenat-FeFum-DSS-FA-DHA w/o A (PNV-DHA+DOCUSATE) 27-1.25-300 MG CAPS Take 1 capsule by mouth daily before breakfast. 01/02/22   Shelly Bombard, MD      Allergies    Bee venom and Ciprofloxacin    Review of Systems   Review of Systems  Constitutional:  Negative for chills and fever.  Musculoskeletal:  Negative for myalgias.  Skin:        Abscess  All other systems reviewed and are negative.  Physical Exam Updated Vital Signs BP 121/73 (BP Location: Right Arm)   Pulse 85   Temp 97.8 F (36.6 C) (Oral)   Resp 17   Ht 5' (1.524 m)   Wt 100.2 kg   LMP 10/14/2021 (Exact Date)   SpO2 98%   BMI 43.16 kg/m  Physical Exam Vitals and nursing note reviewed.  Constitutional:      Appearance: Normal appearance.  HENT:     Head: Normocephalic and atraumatic.  Eyes:     Conjunctiva/sclera: Conjunctivae normal.  Pulmonary:     Effort: Pulmonary effort is normal. No respiratory  distress.  Abdominal:     Comments: Protuberant abdomen consistent with third trimester pregnancy  Skin:    General: Skin is warm and dry.     Comments: 3 cm area of erythema and fluctuance on left upper inner thigh consistent with skin abscess  Neurological:     Mental Status: She is alert.  Psychiatric:        Mood and Affect: Mood normal.        Behavior: Behavior normal.     ED Results / Procedures / Treatments   Labs (all labs ordered are listed, but only abnormal results are displayed) Labs Reviewed - No data to display  EKG None  Radiology No results found.  Procedures .Marland KitchenIncision and Drainage  Date/Time: 06/17/2022 3:06 PM  Performed by: Kateri Plummer, PA-C Authorized by: Kateri Plummer, PA-C   Consent:    Consent obtained:  Verbal   Consent given by:  Patient   Risks, benefits, and alternatives were discussed: yes     Risks discussed:  Bleeding, incomplete drainage and pain Universal protocol:    Procedure explained and questions answered to patient or proxy's satisfaction: yes     Patient identity confirmed:  Verbally with patient Location:    Type:  Abscess   Size:  3 cm   Location:  Lower extremity   Lower  extremity location:  Leg   Leg location:  L upper leg Pre-procedure details:    Skin preparation:  Chlorhexidine with alcohol Sedation:    Sedation type:  None Anesthesia:    Anesthesia method:  Local infiltration   Local anesthetic:  Lidocaine 2% WITH epi Procedure type:    Complexity:  Simple Procedure details:    Needle aspiration: yes     Needle size:  25 G   Incision types:  Stab incision   Incision depth:  Dermal   Wound management:  Probed and deloculated and irrigated with saline   Drainage:  Purulent and bloody   Drainage amount:  Moderate   Wound treatment:  Wound left open   Packing materials:  None Post-procedure details:    Procedure completion:  Tolerated well, no immediate complications     Medications Ordered  in ED Medications  lidocaine-EPINEPHrine (XYLOCAINE W/EPI) 2 %-1:100000 (with pres) injection 20 mL (5 mLs Infiltration Given by Other 06/16/22 2116)    ED Course/ Medical Decision Making/ A&P                           Medical Decision Making Risk Prescription drug management.   Patient is a 36 year old female who presents to the emergency department for an abscess. Hx of hidradenitis suppurativa. Currently [redacted] weeks pregnant.  Physical exam: 3 cm area of erythema and fluctuance on left upper inner thigh consistent with skin abscess  Procedure: Abscess was incised and drained. Area was anesthestized with lidocaine with epi. Patient tolerated procedure well with no immediate complications.   Disposition: Patient is not requiring admission or inpatient treatment further symptoms.  Patient was placed on course of antibiotics and instructed to have a wound check in 3 days.  We discussed reasons to return to the emergency department, and patient is agreeable to the plan.  Final Clinical Impression(s) / ED Diagnoses Final diagnoses:  Leg abscess  Hidradenitis suppurativa    Rx / DC Orders ED Discharge Orders          Ordered    cephALEXin (KEFLEX) 500 MG capsule  4 times daily        06/16/22 2137           Portions of this report may have been transcribed using voice recognition software. Every effort was made to ensure accuracy; however, inadvertent computerized transcription errors may be present.   Estill Cotta 06/17/22 1507    Malvin Johns, MD 06/17/22 1756

## 2022-06-17 NOTE — ED Notes (Signed)
Opened chart at pts request to provide work note.

## 2022-06-18 ENCOUNTER — Encounter (HOSPITAL_COMMUNITY): Payer: Self-pay | Admitting: Obstetrics & Gynecology

## 2022-06-18 ENCOUNTER — Inpatient Hospital Stay (EMERGENCY_DEPARTMENT_HOSPITAL)
Admission: AD | Admit: 2022-06-18 | Discharge: 2022-06-19 | Disposition: A | Discharge status: Home / Self Care | Payer: 59 | Source: Home / Self Care | Attending: Obstetrics & Gynecology | Admitting: Obstetrics & Gynecology

## 2022-06-18 DIAGNOSIS — Z3A35 35 weeks gestation of pregnancy: Secondary | ICD-10-CM | POA: Insufficient documentation

## 2022-06-18 DIAGNOSIS — O4693 Antepartum hemorrhage, unspecified, third trimester: Secondary | ICD-10-CM | POA: Insufficient documentation

## 2022-06-18 DIAGNOSIS — O09523 Supervision of elderly multigravida, third trimester: Secondary | ICD-10-CM | POA: Insufficient documentation

## 2022-06-18 DIAGNOSIS — O4703 False labor before 37 completed weeks of gestation, third trimester: Secondary | ICD-10-CM | POA: Insufficient documentation

## 2022-06-18 DIAGNOSIS — N939 Abnormal uterine and vaginal bleeding, unspecified: Secondary | ICD-10-CM

## 2022-06-18 LAB — URINALYSIS, ROUTINE W REFLEX MICROSCOPIC
Bilirubin Urine: NEGATIVE
Glucose, UA: NEGATIVE mg/dL
Hgb urine dipstick: NEGATIVE
Ketones, ur: NEGATIVE mg/dL
Leukocytes,Ua: NEGATIVE
Nitrite: NEGATIVE
Protein, ur: NEGATIVE mg/dL
Specific Gravity, Urine: 1.02 (ref 1.005–1.030)
pH: 6 (ref 5.0–8.0)

## 2022-06-18 LAB — WET PREP, GENITAL
Clue Cells Wet Prep HPF POC: NONE SEEN
Sperm: NONE SEEN
Trich, Wet Prep: NONE SEEN
WBC, Wet Prep HPF POC: <10 (ref <10)
Yeast Wet Prep HPF POC: NONE SEEN

## 2022-06-18 MED ORDER — NIFEDIPINE 10 MG PO CAPS
ORAL_CAPSULE | ORAL | Status: AC
  Administered 2022-06-18: 10 mg via ORAL
  Filled 2022-06-18: qty 1

## 2022-06-18 MED ORDER — NIFEDIPINE 10 MG PO CAPS
10.0000 mg | ORAL_CAPSULE | ORAL | Status: AC | PRN
  Administered 2022-06-18 (×2): 10 mg via ORAL
  Filled 2022-06-18 (×2): qty 1

## 2022-06-18 NOTE — MAU Provider Note (Signed)
Chief Complaint:  Abdominal Pain and Vaginal Discharge   Event Date/Time   First Provider Initiated Contact with Patient 06/18/22 2054      HPI: Frances Mccormick is a 36 y.o. G3P0020 at 103w2dby LMP, c/w 9 week [place] [facility] reporting cramping with onset 06/17/22, every 10-15 minutes, each lasting around 30 seconds, then new onset brown discharge this morning.  She reports that the cramping is unchanged in intensity since onset.  The brown discharge/bleeding improved over the day today and is scant now when wiping.  There is no itching/burning/odor with the discharge.  She denies recent intercourse. 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 She has been drinking water, but reports it may not be enough.  There are no other symptoms. She reports good fetal movement.    HPI  Past Medical History: Past Medical History:  Diagnosis Date   [place]'s [place]    Hidradenitis suppurativa    Hypertension    Migraine    "a few/year" (07/27/2018)   Non-compliant patient     Past obstetric history: OB History  Gravida Para Term Preterm AB Living  3 0     2    SAB IAB Ectopic Multiple Live Births               # Outcome Date GA Lbr Len/2nd Weight Sex Delivery Anes PTL Lv  3 Current           2 AB 2008     TAB     1 AB 2004     TAB       Past Surgical History: Past Surgical History:  Procedure Laterality Date   INDUCED ABORTION      Family History: Family History  Problem Relation Age of Onset   Hypertension Mother    Diabetes Mother    Heart disease Mother    Diabetes Father    Hypertension Maternal Grandmother    Stroke Maternal Grandmother    Hypertension Maternal Grandfather    Diabetes Maternal Grandfather    Breast cancer Maternal Aunt     Social History: Social History   Tobacco Use   Smoking status: Never   Smokeless tobacco: Never  Vaping Use   Vaping Use: Never used   Substance Use Topics   Alcohol use: Not Currently    Alcohol/week: 2.0 standard drinks of alcohol    Types: 1 Glasses of wine, 1 Cans of beer per week    Comment: 1 x per week, prior to pregnancy   Drug use: Not Currently    Types: Marijuana    Comment: occaisonally, not since confirmed pregnancy    Allergies:  Allergies  Allergen Reactions   Bee Venom Anaphylaxis   Ciprofloxacin Anaphylaxis    Meds:  No medications prior to admission.    ROS:  Review of Systems  Constitutional:  Negative for chills, fatigue and fever.  Eyes:  Negative for visual disturbance.  Respiratory:  Negative for shortness of breath.   Cardiovascular:  Positive for leg swelling. Negative for chest pain.  Gastrointestinal:  Positive for abdominal pain. Negative for nausea and vomiting.  Genitourinary:  Positive for vaginal bleeding and vaginal discharge. Negative for difficulty urinating, dysuria, flank pain, pelvic pain and vaginal pain.  Musculoskeletal:  Positive for back pain.  Neurological:  Negative for dizziness and headaches.  Psychiatric/Behavioral: Negative.       I have reviewed  patient's Past Medical Hx, Surgical Hx, Family Hx, Social Hx, medications and allergies.   Physical Exam  Patient Vitals for the past 24 hrs:  BP Temp Temp src Pulse Resp SpO2 Height Weight  06/19/22 0050 112/80 -- -- -- -- -- -- --  06/18/22 2305 131/70 -- -- (!) 109 -- -- -- --  06/18/22 2239 123/60 -- -- -- -- -- -- --  06/18/22 2208 127/78 -- -- -- -- -- -- --  06/18/22 2017 124/70 -- -- 77 -- 100 % -- --  06/18/22 2000 124/67 98.5 F (36.9 C) Oral 89 18 100 % 5' (1.524 m) 102.8 kg   Constitutional: Well-developed, well-nourished female in no acute distress.  Cardiovascular: normal rate Respiratory: normal effort GI: Abd soft, non-tender, gravid appropriate for gestational age.  MS: Extremities nontender, no edema, normal ROM Neurologic: Alert and oriented x 4.  GU: Neg CVAT.  PELVIC EXAM: Cervix  pink, visually 1 cm, small amount of mucus mixed brown blood requiring 1 fox swab to visualize cervix, vaginal walls and external genitalia normal  Dilation: 1 Effacement (%): 50 Station: -3 Presentation: Vertex Exam by:: Fatima Blank, CNM  FHT:  Baseline 135 , moderate variability, accelerations present, no decelerations Contractions: irregular, every 2-8 minutes, mild to palpation   Labs: Results for orders placed or performed during the hospital encounter of 06/18/22 (from the past 24 hour(s))  Urinalysis, Routine w reflex microscopic     Status: Abnormal   Collection Time: 06/18/22  7:50 PM  Result Value Ref Range   Color, Urine YELLOW YELLOW   APPearance HAZY (A) CLEAR   Specific Gravity, Urine 1.020 1.005 - 1.030   pH 6.0 5.0 - 8.0   Glucose, UA NEGATIVE NEGATIVE mg/dL   Hgb urine dipstick NEGATIVE NEGATIVE   Bilirubin Urine NEGATIVE NEGATIVE   Ketones, ur NEGATIVE NEGATIVE mg/dL   Protein, ur NEGATIVE NEGATIVE mg/dL   Nitrite NEGATIVE NEGATIVE   Leukocytes,Ua NEGATIVE NEGATIVE  Wet prep, genital     Status: None   Collection Time: 06/18/22  8:55 PM  Result Value Ref Range   Yeast Wet Prep HPF POC NONE SEEN NONE SEEN   Trich, Wet Prep NONE SEEN NONE SEEN   Clue Cells Wet Prep HPF POC NONE SEEN NONE SEEN   WBC, Wet Prep HPF POC <10 <10   Sperm NONE SEEN    B/Positive/-- (02/02 1010)  Imaging:   MAU Course/MDM: Orders Placed This Encounter  Procedures   Wet prep, genital   Urinalysis, Routine w reflex microscopic   Discharge patient    Meds ordered this encounter  Medications   NIFEdipine (PROCARDIA) capsule 10 mg   NIFEdipine (PROCARDIA) 10 MG capsule    Twitty, Alondra S: cabinet override     NST reviewed and reactive UA and wet prep wnl Brown discharge and mucus consistent with mucus plug, more mucus than bleeding, no active bleeding noted Contractions improved after Procardia x 3 doses but started to return before discharge Cervix unchanged in  3 hours in MAU No evidence of active preterm labor PTL precautions given, pt to return to MAU with worsening symptoms Keep scheduled appts in office    Assessment: 1. Threatened premature labor in third trimester   2. Vaginal spotting   3. [redacted] weeks gestation of pregnancy     Plan: Discharge home Labor precautions and fetal kick counts  Allergies as of 06/19/2022       Reactions   Bee Venom Anaphylaxis   Ciprofloxacin Anaphylaxis  Medication List     STOP taking these medications    cyclobenzaprine 5 MG tablet Commonly known as: FLEXERIL       TAKE these medications    aspirin EC 81 MG tablet Take 81 mg by mouth daily. Swallow whole.   cephALEXin 500 MG capsule Commonly known as: KEFLEX Take 1 capsule (500 mg total) by mouth 4 (four) times daily.   PNV-DHA+Docusate 27-1.25-300 MG Caps Take 1 capsule by mouth daily before breakfast.        Fatima Blank Certified Nurse-Midwife 06/19/2022 2:21 AM

## 2022-06-18 NOTE — MAU Note (Signed)
.  Frances Mccormick is a 36 y.o. at 32w2dhere in MAU reporting: severe intermittent back and ABD cramping for the last couple days but worsen today. Pt reports seeing some dark brown vaginal discharge this morning and has continued but lessening. Pt states she has a HA and feeling dizziness. Pt has not taken anything for pain. Pt denies VB, LOF, and complication in the pregnancy. No recent intercourse. Taking antibiotics for an abscess she had previous drained.  Hx of CHTN Onset of complaint: last couple days Pain score: 10/10 ABD, 5/10 HA Vitals:   06/18/22 2000  BP: 124/67  Pulse: 89  Resp: 18  Temp: 98.5 F (36.9 C)  SpO2: 100%     FHAF:BXUXYBto assess due to pt clothing  Lab orders placed from triage:  UA

## 2022-06-19 ENCOUNTER — Inpatient Hospital Stay (HOSPITAL_COMMUNITY)
Admission: AD | Admit: 2022-06-19 | Discharge: 2022-06-22 | DRG: 806 | Disposition: A | Discharge status: Home / Self Care | Payer: 59 | Attending: Obstetrics and Gynecology | Admitting: Obstetrics and Gynecology

## 2022-06-19 ENCOUNTER — Encounter (HOSPITAL_COMMUNITY): Payer: Self-pay | Admitting: Obstetrics and Gynecology

## 2022-06-19 ENCOUNTER — Inpatient Hospital Stay (HOSPITAL_COMMUNITY): Payer: 59 | Admitting: Anesthesiology

## 2022-06-19 ENCOUNTER — Other Ambulatory Visit: Payer: Self-pay

## 2022-06-19 DIAGNOSIS — O4703 False labor before 37 completed weeks of gestation, third trimester: Secondary | ICD-10-CM | POA: Diagnosis not present

## 2022-06-19 DIAGNOSIS — Z3A35 35 weeks gestation of pregnancy: Secondary | ICD-10-CM | POA: Diagnosis not present

## 2022-06-19 DIAGNOSIS — O114 Pre-existing hypertension with pre-eclampsia, complicating childbirth: Secondary | ICD-10-CM | POA: Diagnosis present

## 2022-06-19 DIAGNOSIS — D563 Thalassemia minor: Secondary | ICD-10-CM | POA: Diagnosis present

## 2022-06-19 DIAGNOSIS — O42913 Preterm premature rupture of membranes, unspecified as to length of time between rupture and onset of labor, third trimester: Secondary | ICD-10-CM | POA: Diagnosis present

## 2022-06-19 DIAGNOSIS — O9962 Diseases of the digestive system complicating childbirth: Secondary | ICD-10-CM | POA: Diagnosis present

## 2022-06-19 DIAGNOSIS — O119 Pre-existing hypertension with pre-eclampsia, unspecified trimester: Secondary | ICD-10-CM | POA: Diagnosis present

## 2022-06-19 DIAGNOSIS — N939 Abnormal uterine and vaginal bleeding, unspecified: Secondary | ICD-10-CM

## 2022-06-19 DIAGNOSIS — O1002 Pre-existing essential hypertension complicating childbirth: Secondary | ICD-10-CM | POA: Diagnosis present

## 2022-06-19 DIAGNOSIS — O99214 Obesity complicating childbirth: Secondary | ICD-10-CM | POA: Diagnosis present

## 2022-06-19 DIAGNOSIS — K5090 Crohn's disease, unspecified, without complications: Secondary | ICD-10-CM | POA: Diagnosis present

## 2022-06-19 DIAGNOSIS — E0590 Thyrotoxicosis, unspecified without thyrotoxic crisis or storm: Secondary | ICD-10-CM

## 2022-06-19 DIAGNOSIS — O42013 Preterm premature rupture of membranes, onset of labor within 24 hours of rupture, third trimester: Principal | ICD-10-CM | POA: Diagnosis present

## 2022-06-19 DIAGNOSIS — O99281 Endocrine, nutritional and metabolic diseases complicating pregnancy, first trimester: Secondary | ICD-10-CM | POA: Diagnosis present

## 2022-06-19 DIAGNOSIS — O09522 Supervision of elderly multigravida, second trimester: Secondary | ICD-10-CM | POA: Diagnosis present

## 2022-06-19 DIAGNOSIS — Z7982 Long term (current) use of aspirin: Secondary | ICD-10-CM | POA: Diagnosis not present

## 2022-06-19 DIAGNOSIS — O0990 Supervision of high risk pregnancy, unspecified, unspecified trimester: Secondary | ICD-10-CM

## 2022-06-19 DIAGNOSIS — Z3491 Encounter for supervision of normal pregnancy, unspecified, first trimester: Principal | ICD-10-CM

## 2022-06-19 HISTORY — DX: Preterm premature rupture of membranes, onset of labor within 24 hours of rupture, third trimester: O42.013

## 2022-06-19 LAB — PROTEIN / CREATININE RATIO, URINE
Creatinine, Urine: 102 mg/dL
Protein Creatinine Ratio: 0.26 mg/mg{Cre} — ABNORMAL HIGH (ref 0.00–0.15)
Total Protein, Urine: 27 mg/dL

## 2022-06-19 LAB — CBC
HCT: 37.9 % (ref 36.0–46.0)
Hemoglobin: 12.9 g/dL (ref 12.0–15.0)
MCH: 25.9 pg — ABNORMAL LOW (ref 26.0–34.0)
MCHC: 34 g/dL (ref 30.0–36.0)
MCV: 76 fL — ABNORMAL LOW (ref 80.0–100.0)
Platelets: 414 10*3/uL — ABNORMAL HIGH (ref 150–400)
RBC: 4.99 MIL/uL (ref 3.87–5.11)
RDW: 15.1 % (ref 11.5–15.5)
WBC: 19.3 10*3/uL — ABNORMAL HIGH (ref 4.0–10.5)
nRBC: 0 % (ref 0.0–0.2)

## 2022-06-19 LAB — COMPREHENSIVE METABOLIC PANEL
ALT: 16 U/L (ref 0–44)
AST: 20 U/L (ref 15–41)
Albumin: 2.7 g/dL — ABNORMAL LOW (ref 3.5–5.0)
Alkaline Phosphatase: 147 U/L — ABNORMAL HIGH (ref 38–126)
Anion gap: 13 (ref 5–15)
BUN: 7 mg/dL (ref 6–20)
CO2: 21 mmol/L — ABNORMAL LOW (ref 22–32)
Calcium: 8.8 mg/dL — ABNORMAL LOW (ref 8.9–10.3)
Chloride: 104 mmol/L (ref 98–111)
Creatinine, Ser: 0.64 mg/dL (ref 0.44–1.00)
GFR, Estimated: >60 mL/min (ref >60)
Glucose, Bld: 97 mg/dL (ref 70–99)
Potassium: 3.6 mmol/L (ref 3.5–5.1)
Sodium: 138 mmol/L (ref 135–145)
Total Bilirubin: 0.5 mg/dL (ref 0.3–1.2)
Total Protein: 7.1 g/dL (ref 6.5–8.1)

## 2022-06-19 LAB — GC/CHLAMYDIA PROBE AMP (~~LOC~~) NOT AT ARMC
Chlamydia: NEGATIVE
Comment: NEGATIVE
Comment: NORMAL
Neisseria Gonorrhea: NEGATIVE

## 2022-06-19 LAB — TYPE AND SCREEN
ABO/RH(D): B POS
Antibody Screen: NEGATIVE

## 2022-06-19 LAB — POCT FERN TEST: POCT Fern Test: POSITIVE

## 2022-06-19 MED ORDER — DIPHENHYDRAMINE HCL 50 MG/ML IJ SOLN
12.5000 mg | INTRAMUSCULAR | Status: DC | PRN
  Administered 2022-06-20: 12.5 mg via INTRAVENOUS
  Filled 2022-06-19: qty 1

## 2022-06-19 MED ORDER — OXYTOCIN BOLUS FROM INFUSION
333.0000 mL | Freq: Once | INTRAVENOUS | Status: AC
  Administered 2022-06-20: 333 mL via INTRAVENOUS

## 2022-06-19 MED ORDER — MAGNESIUM SULFATE 40 GM/1000ML IV SOLN
2.0000 g/h | INTRAVENOUS | Status: DC
  Filled 2022-06-19: qty 1000

## 2022-06-19 MED ORDER — EPHEDRINE 5 MG/ML INJ: 10.0000 mg | INTRAVENOUS | Status: DC | PRN

## 2022-06-19 MED ORDER — FENTANYL CITRATE (PF) 100 MCG/2ML IJ SOLN
50.0000 ug | INTRAMUSCULAR | Status: DC | PRN
  Administered 2022-06-19 (×2): 100 ug via INTRAVENOUS
  Administered 2022-06-20: 50 ug via INTRAVENOUS
  Administered 2022-06-20: 100 ug via INTRAVENOUS
  Filled 2022-06-19 (×5): qty 2

## 2022-06-19 MED ORDER — LACTATED RINGERS IV SOLN
500.0000 mL | Freq: Once | INTRAVENOUS | Status: AC
  Administered 2022-06-19: 500 mL via INTRAVENOUS

## 2022-06-19 MED ORDER — ACETAMINOPHEN 325 MG PO TABS: 650.0000 mg | ORAL_TABLET | ORAL | Status: DC | PRN

## 2022-06-19 MED ORDER — ONDANSETRON HCL 4 MG/2ML IJ SOLN: 4.0000 mg | Freq: Four times a day (QID) | INTRAMUSCULAR | Status: DC | PRN

## 2022-06-19 MED ORDER — FENTANYL-BUPIVACAINE-NACL 0.5-0.125-0.9 MG/250ML-% EP SOLN
EPIDURAL | Status: DC | PRN
  Administered 2022-06-19: 12 mL/h via EPIDURAL

## 2022-06-19 MED ORDER — LIDOCAINE HCL (PF) 1 % IJ SOLN
INTRAMUSCULAR | Status: DC | PRN
  Administered 2022-06-19: 5 mL via EPIDURAL

## 2022-06-19 MED ORDER — LACTATED RINGERS IV SOLN: INTRAVENOUS | Status: DC

## 2022-06-19 MED ORDER — PHENYLEPHRINE 80 MCG/ML (10ML) SYRINGE FOR IV PUSH (FOR BLOOD PRESSURE SUPPORT): 80.0000 ug | PREFILLED_SYRINGE | INTRAVENOUS | Status: DC | PRN

## 2022-06-19 MED ORDER — SOD CITRATE-CITRIC ACID 500-334 MG/5ML PO SOLN
30.0000 mL | ORAL | Status: DC | PRN
Start: 2022-06-19 — End: 2022-06-20

## 2022-06-19 MED ORDER — LABETALOL HCL 5 MG/ML IV SOLN: 80.0000 mg | INTRAVENOUS | Status: DC | PRN

## 2022-06-19 MED ORDER — LABETALOL HCL 5 MG/ML IV SOLN
20.0000 mg | INTRAVENOUS | Status: DC | PRN
  Administered 2022-06-19 – 2022-06-20 (×2): 20 mg via INTRAVENOUS
  Filled 2022-06-19 (×2): qty 4

## 2022-06-19 MED ORDER — LIDOCAINE HCL (PF) 1 % IJ SOLN
30.0000 mL | INTRAMUSCULAR | Status: AC | PRN
  Administered 2022-06-20: 30 mL via SUBCUTANEOUS
  Filled 2022-06-19: qty 30

## 2022-06-19 MED ORDER — TERBUTALINE SULFATE 1 MG/ML IJ SOLN: 0.2500 mg | Freq: Once | INTRAMUSCULAR | Status: DC | PRN

## 2022-06-19 MED ORDER — LACTATED RINGERS IV SOLN: 500.0000 mL | INTRAVENOUS | Status: DC | PRN

## 2022-06-19 MED ORDER — OXYTOCIN-SODIUM CHLORIDE 30-0.9 UT/500ML-% IV SOLN
2.5000 [IU]/h | INTRAVENOUS | Status: DC
Start: 2022-06-19 — End: 2022-06-20
  Administered 2022-06-20: 2.5 [IU]/h via INTRAVENOUS
  Filled 2022-06-19: qty 500

## 2022-06-19 MED ORDER — LACTATED RINGERS IV SOLN
INTRAVENOUS | Status: DC
Start: 2022-06-19 — End: 2022-06-20

## 2022-06-19 MED ORDER — BETAMETHASONE SOD PHOS & ACET 6 (3-3) MG/ML IJ SUSP
12.0000 mg | Freq: Once | INTRAMUSCULAR | Status: AC
  Administered 2022-06-19: 12 mg via INTRAMUSCULAR
  Filled 2022-06-19: qty 5

## 2022-06-19 MED ORDER — LABETALOL HCL 5 MG/ML IV SOLN
40.0000 mg | INTRAVENOUS | Status: DC | PRN
Start: 2022-06-19 — End: 2022-06-20
  Administered 2022-06-20: 40 mg via INTRAVENOUS
  Filled 2022-06-19: qty 8

## 2022-06-19 MED ORDER — MAGNESIUM SULFATE BOLUS VIA INFUSION
4.0000 g | Freq: Once | INTRAVENOUS | Status: AC
  Administered 2022-06-19: 4 g via INTRAVENOUS
  Filled 2022-06-19: qty 1000

## 2022-06-19 MED ORDER — OXYTOCIN-SODIUM CHLORIDE 30-0.9 UT/500ML-% IV SOLN
1.0000 m[IU]/min | INTRAVENOUS | Status: DC
  Administered 2022-06-20: 2 m[IU]/min via INTRAVENOUS

## 2022-06-19 MED ORDER — HYDRALAZINE HCL 20 MG/ML IJ SOLN: 10.0000 mg | INTRAMUSCULAR | Status: DC | PRN

## 2022-06-19 MED ORDER — FENTANYL-BUPIVACAINE-NACL 0.5-0.125-0.9 MG/250ML-% EP SOLN
12.0000 mL/h | EPIDURAL | Status: DC | PRN
  Administered 2022-06-20: 12 mL/h via EPIDURAL
  Filled 2022-06-19 (×2): qty 250

## 2022-06-19 MED ORDER — SODIUM CHLORIDE 0.9 % IV SOLN
5.0000 10*6.[IU] | Freq: Once | INTRAVENOUS | Status: AC
  Administered 2022-06-19: 5 10*6.[IU] via INTRAVENOUS
  Filled 2022-06-19: qty 5

## 2022-06-19 MED ORDER — PENICILLIN G POT IN DEXTROSE 60000 UNIT/ML IV SOLN
3.0000 10*6.[IU] | INTRAVENOUS | Status: DC
  Administered 2022-06-19 – 2022-06-20 (×3): 3 10*6.[IU] via INTRAVENOUS
  Filled 2022-06-19 (×5): qty 50

## 2022-06-19 NOTE — H&P (Addendum)
OBSTETRIC ADMISSION HISTORY AND PHYSICAL  Frances Mccormick is a 36 y.o. female G34P0020 with IUP at 66w3dby LMP presenting for PPROM w/ worsening cHTN v. SIPE. She reports +FMs, No LOF, no VB, no blurry vision, headaches or peripheral edema, and RUQ pain.  She plans on breast and bottle feeding. She requests OCP for birth control. [place] [facility] Dating: By LMP --->  Estimated Date of Delivery: 07/21/22  Sono:    @[redacted]w[redacted]d , CWD, normal anatomy, cephalic presentation,  11610g, 27% EFW   Prenatal History/Complications:  Hyperemesis gravidarum requiring hospitalization Hyperthyroidism Chronic HTN not on medication Crohn's disease w/o complication Maternal obesity, BMI 44  Past Medical History: Past Medical History:  Diagnosis Date   [place]    Hidradenitis suppurativa    Hypertension    Migraine    "a few/year" (07/27/2018)   Non-compliant patient     Past Surgical History: Past Surgical History:  Procedure Laterality Date   INDUCED ABORTION      Obstetrical History: OB History     Gravida  3   Para  0   Term      Preterm      AB  2   Living         SAB      IAB      Ectopic      Multiple      Live Births              Social History Social History   Socioeconomic History   Marital status: Single    Spouse name: Not on file   Number of children: Not on file   Years of education: Not on file   Highest education level: Not on file  Occupational History   Not on file  Tobacco Use   Smoking status: Never   Smokeless tobacco: Never  Vaping Use   Vaping Use: Never used  Substance and Sexual Activity   Alcohol use: Not Currently    Alcohol/week: 2.0 standard drinks of alcohol    Types: 1 Glasses of wine, 1 Cans of beer per week    Comment: 1 x per week, prior to pregnancy   Drug use: Not Currently    Types: Marijuana    Comment: occaisonally, not since confirmed pregnancy   Sexual activity: Yes     Partners: Male    Birth control/protection: Pill  Other Topics Concern   Not on file  Social History Narrative   Not on file   Social Determinants of Health   Financial Resource Strain: Not on file  Food Insecurity: Not on file  Transportation Needs: Not on file  Physical Activity: Not on file  Stress: Not on file  Social Connections: Not on file    Family History: Family History  Problem Relation Age of Onset   Hypertension Mother    Diabetes Mother    Heart disease Mother    Diabetes Father    Hypertension Maternal Grandmother    Stroke Maternal Grandmother    Hypertension Maternal Grandfather    Diabetes Maternal Grandfather    Breast cancer Maternal Aunt     Allergies: Allergies  Allergen Reactions   Bee Venom Anaphylaxis   Ciprofloxacin Anaphylaxis    Pt denies allergies to latex, iodine, or shellfish.  Medications Prior to Admission  Medication Sig Dispense Refill Last Dose   aspirin EC 81 MG tablet Take 81 mg by mouth daily. Swallow  whole.   Past Week   cephALEXin (KEFLEX) 500 MG capsule Take 1 capsule (500 mg total) by mouth 4 (four) times daily. 20 capsule 0 Past Week   Prenat-FeFum-DSS-FA-DHA w/o A (PNV-DHA+DOCUSATE) 27-1.25-300 MG CAPS Take 1 capsule by mouth daily before breakfast. 90 capsule 4 06/18/2022     Review of Systems   All systems reviewed and negative except as stated in HPI  Blood pressure (!) 159/84, pulse (!) 102, temperature 98.6 F (37 C), temperature source Oral, resp. rate 19, last menstrual period 10/14/2021, SpO2 100 %, unknown if currently breastfeeding. General appearance: alert, cooperative, appears stated age, and no distress Lungs: clear to auscultation bilaterally Heart: regular rate and rhythm Abdomen: soft, non-tender; bowel sounds normal Extremities: Homans sign is negative, no sign of DVT Presentation: cephalic Fetal monitoringBaseline: 120 bpm, Variability: Good {> 6 bpm), Accelerations: Reactive, and  Decelerations: Variable: mild Uterine activity Irregular every 1-4 mins Dilation: 5.5 Effacement (%): 70 Station: -2 Exam by:: Autry-Lot, MD   Prenatal labs: ABO, Rh: --/--/B POS (07/20 1335) Antibody: NEG (07/20 1335) Rubella: <0.90 (02/02 1010) RPR: Non Reactive (05/26 1042)  HBsAg: Negative (02/02 1010)  HIV: Non Reactive (05/26 1042)  GBS:    1 hr Glucola: Normal Genetic screening:  Alpha Thal Silent Carrier Anatomy US: Normal  Prenatal Transfer Tool  Maternal Diabetes: No Genetic Screening: Abnormal:  Results: Other:Alpha Thal Silent Carrier Maternal Ultrasounds/Referrals: Normal Fetal Ultrasounds or other Referrals:  None Maternal Substance Abuse:  No Significant Maternal Medications:  None Significant Maternal Lab Results: None  Results for orders placed or performed during the hospital encounter of 06/19/22 (from the past 24 hour(s))  Fern Test   Collection Time: 06/19/22 11:44 AM  Result Value Ref Range   POCT Fern Test Positive = ruptured amniotic membanes   CBC   Collection Time: 06/19/22  1:35 PM  Result Value Ref Range   WBC 19.3 (H) 4.0 - 10.5 K/uL   RBC 4.99 3.87 - 5.11 MIL/uL   Hemoglobin 12.9 12.0 - 15.0 g/dL   HCT 37.9 36.0 - 46.0 %   MCV 76.0 (L) 80.0 - 100.0 fL   MCH 25.9 (L) 26.0 - 34.0 pg   MCHC 34.0 30.0 - 36.0 g/dL   RDW 15.1 11.5 - 15.5 %   Platelets 414 (H) 150 - 400 K/uL   nRBC 0.0 0.0 - 0.2 %  Comprehensive metabolic panel   Collection Time: 06/19/22  1:35 PM  Result Value Ref Range   Sodium 138 135 - 145 mmol/L   Potassium 3.6 3.5 - 5.1 mmol/L   Chloride 104 98 - 111 mmol/L   CO2 21 (L) 22 - 32 mmol/L   Glucose, Bld 97 70 - 99 mg/dL   BUN 7 6 - 20 mg/dL   Creatinine, Ser 0.64 0.44 - 1.00 mg/dL   Calcium 8.8 (L) 8.9 - 10.3 mg/dL   Total Protein 7.1 6.5 - 8.1 g/dL   Albumin 2.7 (L) 3.5 - 5.0 g/dL   AST 20 15 - 41 U/L   ALT 16 0 - 44 U/L   Alkaline Phosphatase 147 (H) 38 - 126 U/L   Total Bilirubin 0.5 0.3 - 1.2 mg/dL   GFR,  Estimated >60 >60 mL/min   Anion gap 13 5 - 15  Type and screen [place]   Collection Time: 06/19/22  1:35 PM  Result Value Ref Range   ABO/RH(D) B POS    Antibody Screen NEG    Sample Expiration  06/22/2022,2359 Performed at [facility] [address] [place], [place] [address]   Results for orders placed or performed during the hospital encounter of 06/18/22 (from the past 24 hour(s))  Urinalysis, Routine w reflex microscopic   Collection Time: 06/18/22  7:50 PM  Result Value Ref Range   Color, Urine YELLOW YELLOW   APPearance HAZY (A) CLEAR   Specific Gravity, Urine 1.020 1.005 - 1.030   pH 6.0 5.0 - 8.0   Glucose, UA NEGATIVE NEGATIVE mg/dL   Hgb urine dipstick NEGATIVE NEGATIVE   Bilirubin Urine NEGATIVE NEGATIVE   Ketones, ur NEGATIVE NEGATIVE mg/dL   Protein, ur NEGATIVE NEGATIVE mg/dL   Nitrite NEGATIVE NEGATIVE   Leukocytes,Ua NEGATIVE NEGATIVE  Wet prep, genital   Collection Time: 06/18/22  8:55 PM  Result Value Ref Range   Yeast Wet Prep HPF POC NONE SEEN NONE SEEN   Trich, Wet Prep NONE SEEN NONE SEEN   Clue Cells Wet Prep HPF POC NONE SEEN NONE SEEN   WBC, Wet Prep HPF POC <10 <10   Sperm NONE SEEN   GC/Chlamydia probe amp ([place])not at [facility]   Collection Time: 06/18/22  8:55 PM  Result Value Ref Range   Neisseria Gonorrhea Negative    Chlamydia Negative    Comment Normal Reference [place] Chlamydia - Negative    Comment      Normal Reference Range Neisseria Gonorrhea - Negative    Patient Active Problem List   Diagnosis Date Noted   Preterm premature rupture of membranes (PPROM) with onset of labor within 24 hours of rupture in third trimester, antepartum 06/19/2022   Advanced maternal age in multigravida, second trimester 04/25/2022   Alpha thalassemia silent carrier 02/25/2022   Hyperthyroidism affecting pregnancy in first trimester 01/12/2022   Supervision of high risk pregnancy, antepartum 12/16/2021    Exacerbation of Crohn's disease without complication ([place]) 32/67/1245   Colitis 06/07/2021   Enterocolitis 06/07/2021   Hypertension 09/30/2018   Hypokalemia 09/30/2018   Leukocytosis 09/30/2018   Pilonidal abscess 09/30/2018   Chronic hypertension with superimposed preeclampsia 08/11/2018   Medically noncompliant 10/26/2014   Crohn's disease ([place]) 10/26/2014    Assessment/Plan:  Frances Mccormick is a 36 y.o. G3P0020 at 57w3dhere for PPROM, w/ GHTN with superimposed Pre E.  #Labor:Patient is 35 wk with PPROM and cHTN v. superimposed Pre E. Presented following SROM at 0930 with clear fluid and cervical exam progressed from 1 to 5 cm with most recent exam as above. [facility] [place] [facility] start of [facility] labor. Mom is aware there is still potential for infant to be transferred if neccessary  #Pain: Epidural #FWB: Category II, occasional variable decels with maintaining good variability which is reassuring #ID: GBS unknown, on PCN #MOF: Both #MOC: OCP #Circ:  Yes  # worsening cHTN v. superimposed Pre E 2/2 BP readings Chronic hypertension without antihypertensives. Patient BP's have been elevated with 2 readings in the severe range. Will continue to monitor and treat BP as needed with possible addition of mag if BP continues to be elevated according to protocol. PP Lasix for 5 days at d/c. On admission, liver enzymes WNL, PLT slightly elevated to 414, with a normal Cr. Urine protein:creatinine pending.  BHolley Bouche MGuayamafor [facility] 06/19/2022, 6:43 PM  GME ATTESTATION:  I saw and evaluated the patient. I agree with the findings and the plan of care as documented in the resident's note. I have  made changes to documentation as necessary.  Gerlene Fee, DO OB Fellow, [place] [facility] 06/19/2022, 6:58 PM

## 2022-06-19 NOTE — MAU Provider Note (Addendum)
History     [id number]  Arrival date and time: 06/19/22 1134    Chief Complaint  Patient presents with   Rupture of Membranes   Contractions     HPI Frances Mccormick is a 36 y.o. at 50w3dby 9 wk UKoreawith PMHx notable for Crohn's disease (not currently active, not on meds), hyperthyroidism (normal TFT's in 01/2022, not on meds), and cHTN (not on meds, normotensive to date in this pregnancy), who presents for rupture of membranes.   Patient was seen in the MAU last night for labor check, was 1 cm and 50% effaced on repeat checks and sent home  She reports that around 0930 or 1000 AM she had a clear gush of fluid Has had painful contractions every couple of minutes since then Some bloody show but no frank bleeding Baby is moving but less than usual No headaches, vision changes, chest pain, shortness of breath, RUQ pain, lower extremity swelling  B/Positive/-- (02/02 1010)  OB History     Gravida  3   Para  0   Term      Preterm      AB  2   Living         SAB      IAB      Ectopic      Multiple      Live Births              Past Medical History:  Diagnosis Date   Crohn's colitis ([place]    Hidradenitis suppurativa    Hypertension    Migraine    "a few/year" (07/27/2018)   Non-compliant patient     Past Surgical History:  Procedure Laterality Date   INDUCED ABORTION      Family History  Problem Relation Age of Onset   Hypertension Mother    Diabetes Mother    Heart disease Mother    Diabetes Father    Hypertension Maternal Grandmother    Stroke Maternal Grandmother    Hypertension Maternal Grandfather    Diabetes Maternal Grandfather    Breast cancer Maternal Aunt     Social History   Socioeconomic History   Marital status: Single    Spouse name: Not on file   Number of children: Not on file   Years of education: Not on file   Highest education level: Not on file  Occupational History   Not on file  Tobacco Use   Smoking  status: Never   Smokeless tobacco: Never  Vaping Use   Vaping Use: Never used  Substance and Sexual Activity   Alcohol use: Not Currently    Alcohol/week: 2.0 standard drinks of alcohol    Types: 1 Glasses of wine, 1 Cans of beer per week    Comment: 1 x per week, prior to pregnancy   Drug use: Not Currently    Types: Marijuana    Comment: occaisonally, not since confirmed pregnancy   Sexual activity: Yes    Partners: Male    Birth control/protection: None  Other Topics Concern   Not on file  Social History Narrative   Not on file   Social Determinants of Health   Financial Resource Strain: Not on file  Food Insecurity: Not on file  Transportation Needs: Not on file  Physical Activity: Not on file  Stress: Not on file  Social Connections: Not on file  Intimate Partner Violence: Not on file    Allergies  Allergen Reactions   Bee  Venom Anaphylaxis   Ciprofloxacin Anaphylaxis    No current facility-administered medications on file prior to encounter.   Current Outpatient Medications on File Prior to Encounter  Medication Sig Dispense Refill   aspirin EC 81 MG tablet Take 81 mg by mouth daily. Swallow whole.     cephALEXin (KEFLEX) 500 MG capsule Take 1 capsule (500 mg total) by mouth 4 (four) times daily. 20 capsule 0   Prenat-FeFum-DSS-FA-DHA w/o A (PNV-DHA+DOCUSATE) 27-1.25-300 MG CAPS Take 1 capsule by mouth daily before breakfast. 90 capsule 4     ROS Pertinent positives and negative per HPI, all others reviewed and negative  Physical Exam   BP (!) 145/88   Pulse 94   Temp 98.6 F (37 C) (Oral)   Resp 19   LMP 10/14/2021 (Exact Date)   SpO2 97%   Patient Vitals for the past 24 hrs:  BP Temp Temp src Pulse Resp SpO2  06/19/22 1216 (!) 145/88 -- -- 94 -- 97 %  06/19/22 1145 (!) 143/86 -- -- 99 -- 98 %  06/19/22 1139 (!) 144/81 98.6 F (37 C) Oral 87 19 100 %    Physical Exam Vitals reviewed.  Constitutional:      Appearance: She is well-developed.      Comments: Mildly uncomfortable and breathing through contractions  HENT:     Mouth/Throat:     Mouth: Mucous membranes are moist.  Eyes:     General: No scleral icterus. Cardiovascular:     Rate and Rhythm: Normal rate and regular rhythm.  Pulmonary:     Effort: Pulmonary effort is normal. No respiratory distress.  Abdominal:     General: There is no distension.     Palpations: Abdomen is soft.     Tenderness: There is no abdominal tenderness. There is no guarding or rebound.  Skin:    General: Skin is warm.  Neurological:     Mental Status: She is alert.     Coordination: Coordination normal.  Psychiatric:        Mood and Affect: Mood normal.        Behavior: Behavior normal.      Cervical Exam Dilation: 1 Effacement (%): 60 Station: -2, -3 Presentation: Vertex Exam by:: Dr. Dione Plover, MD  Bedside Ultrasound Not done  My interpretation: n/a  FHT Baseline 130, moderate variability, +accels, occasional variable and early decels Toco: regular q3-4 min Cat: II but overall reassuring with moderate variability and accels  Labs Results for orders placed or performed during the hospital encounter of 06/19/22 (from the past 24 hour(s))  Fern Test     Status: None   Collection Time: 06/19/22 11:44 AM  Result Value Ref Range   POCT Fern Test Positive = ruptured amniotic membanes     Imaging No results found.  MAU Course  Procedures  Lab Orders         CBC         RPR         Comprehensive metabolic panel         Fern Test    Meds ordered this encounter  Medications   lactated ringers infusion   fentaNYL (SUBLIMAZE) injection 50-100 mcg   FOLLOWED BY Linked Order Group    penicillin G potassium 5 Million Units in sodium chloride 0.9 % 250 mL IVPB     Order Specific Question:   Antibiotic Indication:     Answer:   Group B Strep Prophylaxis    penicillin G potassium 3  Million Units in dextrose 46m IVPB     Order Specific Question:   Antibiotic Indication:      Answer:   Group B Strep Prophylaxis   betamethasone acetate-betamethasone sodium phosphate (CELESTONE) injection 12 mg   Imaging Orders  No imaging studies ordered today    MDM moderate  Assessment and Plan  #PPROM #Gestational HTN vs PreE #GBS status unknown #[redacted] weeks gestation of pregnancy Patient ruptured. [facility], however contractions have worsened, cervix is progressing, and she is borderline for severe PreE. In addition although tracing is overall reassuring with moderate variability and accels, beginning to have prolonged decels into 90-100's. Overall do not feel it is safe to transfer at this point, will admit to L&D for management. Started on penicillin for GBS prophylaxis and also [facility].   #FWB FHT Cat II NST: Reactive Overall reassuring   Dispo: admit to L&D   MClarnce Flock MD/MPH 06/19/22 1:35 PM   Addendum: I placed an UKoreaguided IV due to difficult IV access, which was attempted four times by RN staff prior to my attempt. I placed a 20 gauge IV above the R AC fossa under direct UKoreavisualization. There was good flow after placement.

## 2022-06-19 NOTE — MAU Note (Signed)
Updated report given to Ernst Bowler, L&D Agricultural consultant. Informed of medications given to patient. FHR baseline, CTX frequency, as well as decelerations. Informed transport will arrive to our facility in the next 20-30 minutes.

## 2022-06-19 NOTE — MAU Note (Signed)
Ernst Bowler, [place] Triage RN with [facility], notified and report given.

## 2022-06-19 NOTE — MAU Note (Signed)
Rob with Transport called. Transport will arrive in 20-30 minutes. Report given.

## 2022-06-19 NOTE — Progress Notes (Signed)
Patient had another severe range blood pressure. Consistent with cHTN with superimposed severe pre-E. Will start magnesium per protocol. Will treat BP as needed. Plan of care discussed with patient who was in agreement with plan of care.  Vilma Meckel, MD

## 2022-06-19 NOTE — MAU Note (Signed)
[facility] notified regarding patient's need for transport. [facility] unable to transport patient due to availability.

## 2022-06-19 NOTE — Progress Notes (Signed)
Patient ID: Frances Mccormick, female   DOB: Mar 20, 1986, 36 y.o.   MRN: [id number] Doing well Requesting epidural  Vitals:   06/19/22 1942 06/19/22 1951 06/19/22 2000 06/19/22 2014  BP: (!) 146/83 (!) 153/95 (!) 162/97 (!) 161/95  Pulse: (!) 104 (!) 118 (!) 109 (!) 116  Resp: 18 16 18    Temp:      TempSrc:      SpO2:       FHR reassuring, average variability and + accels UCs not tracing well, q 4 min  Last Cervix Exam: Dilation: 6.5 Effacement (%): 90 Station: -2 Presentation: Vertex Exam by:: Orene Desanctis, RN  Anticipate SVD Watch BPs, Labetalol protocol prn

## 2022-06-19 NOTE — Anesthesia Procedure Notes (Signed)
Epidural Patient location during procedure: OB Start time: 06/19/2022 5:15 PM End time: 06/19/2022 5:30 PM  Staffing Anesthesiologist: Barnet Glasgow, MD Performed: anesthesiologist   Preanesthetic Checklist Completed: patient identified, IV checked, site marked, risks and benefits discussed, surgical consent, monitors and equipment checked, pre-op evaluation and timeout performed  Epidural Patient position: sitting Prep: DuraPrep and site prepped and draped Patient monitoring: continuous pulse ox and blood pressure Approach: midline Injection technique: LOR air  Needle:  Needle type: Tuohy  Needle gauge: 17 G Needle length: 9 cm and 9 Needle insertion depth: 9 cm Catheter type: closed end flexible Catheter size: 19 Gauge Catheter at skin depth: 15 cm Test dose: negative  Assessment Events: blood not aspirated, injection not painful, no injection resistance, no paresthesia and negative IV test  Additional Notes Patient identified. Risks/Benefits/Options discussed with patient including but not limited to bleeding, infection, nerve damage, paralysis, failed block, incomplete pain control, headache, blood pressure changes, nausea, vomiting, reactions to medication both or allergic, itching and postpartum back pain. Confirmed with bedside nurse the patient's most recent platelet count. Confirmed with patient that they are not currently taking any anticoagulation, have any bleeding history or any family history of bleeding disorders. Patient expressed understanding and wished to proceed. All questions were answered. Sterile technique was used throughout the entire procedure. Please see nursing notes for vital signs. Test dose was given through epidural needle and negative prior to continuing to dose epidural or start infusion. Warning signs of high block given to the patient including shortness of breath, tingling/numbness in hands, complete motor block, or any concerning symptoms with  instructions to call for help. Patient was given instructions on fall risk and not to get out of bed. All questions and concerns addressed with instructions to call with any issues.  1 Attempt (S) . Patient tolerated procedure well.

## 2022-06-19 NOTE — Anesthesia Preprocedure Evaluation (Addendum)
Anesthesia Evaluation  Patient identified by MRN, date of birth, ID band Patient awake    Reviewed: Allergy & Precautions, NPO status , Patient's Chart, lab work & pertinent test results  Airway Mallampati: II  TM Distance: >3 FB Neck ROM: Full    Dental no notable dental hx. (+) Teeth Intact, Dental Advisory Given   Pulmonary neg pulmonary ROS,    Pulmonary exam normal breath sounds clear to auscultation       Cardiovascular hypertension (gHtn), Normal cardiovascular exam Rhythm:Regular Rate:Normal     Neuro/Psych  Headaches,    GI/Hepatic Neg liver ROS, Crohns Dz   Endo/Other  negative endocrine ROS  Renal/GU negative Renal ROS     Musculoskeletal   Abdominal (+) + obese (BMI 44.3),   Peds  Hematology Lab Results      Component                Value               Date                      WBC                      19.3 (H)            06/19/2022                HGB                      12.9                06/19/2022                HCT                      37.9                06/19/2022                MCV                      76.0 (L)            06/19/2022                PLT                      414 (H)             06/19/2022              Anesthesia Other Findings All: Cipro  Reproductive/Obstetrics (+) Pregnancy                           Anesthesia Physical Anesthesia Plan  ASA: 3  Anesthesia Plan: Epidural   Post-op Pain Management:    Induction:   PONV Risk Score and Plan:   Airway Management Planned:   Additional Equipment:   Intra-op Plan:   Post-operative Plan:   Informed Consent: I have reviewed the patients History and Physical, chart, labs and discussed the procedure including the risks, benefits and alternatives for the proposed anesthesia with the patient or authorized representative who has indicated his/her understanding and acceptance.       Plan Discussed  with:   Anesthesia Plan Comments: (35.3 wk  G3P0 w gHtn, BMI 44.3 for LEA)  Anesthesia Quick Evaluation

## 2022-06-19 NOTE — MAU Note (Signed)
Frances Mccormick with [facility] Transport notified and report given. Transport to call back regarding transport availability.

## 2022-06-19 NOTE — MAU Note (Signed)
...  Frances Mccormick is a 36 y.o. at 28w3dhere in MAU reporting: CTX that have continued since last night. Sent home from MAU after midnight. Patient reports she was 1 cm. CTX q2 minutes. Has not felt any fetal movement since being picked up by EMS.   Gush of fluids around 1000. Clear and pink tinged with bloody show.   Pain score: 10/10 lower abdomen  FHT: 135 initial external [facility]

## 2022-06-20 ENCOUNTER — Encounter (HOSPITAL_COMMUNITY): Payer: Self-pay | Admitting: Family Medicine

## 2022-06-20 DIAGNOSIS — O42013 Preterm premature rupture of membranes, onset of labor within 24 hours of rupture, third trimester: Secondary | ICD-10-CM

## 2022-06-20 DIAGNOSIS — O9962 Diseases of the digestive system complicating childbirth: Secondary | ICD-10-CM

## 2022-06-20 DIAGNOSIS — O114 Pre-existing hypertension with pre-eclampsia, complicating childbirth: Secondary | ICD-10-CM

## 2022-06-20 DIAGNOSIS — Z3A35 35 weeks gestation of pregnancy: Secondary | ICD-10-CM

## 2022-06-20 LAB — CBC
HCT: 33.9 % — ABNORMAL LOW (ref 36.0–46.0)
Hemoglobin: 11.4 g/dL — ABNORMAL LOW (ref 12.0–15.0)
MCH: 25.5 pg — ABNORMAL LOW (ref 26.0–34.0)
MCHC: 33.6 g/dL (ref 30.0–36.0)
MCV: 75.8 fL — ABNORMAL LOW (ref 80.0–100.0)
Platelets: 370 10*3/uL (ref 150–400)
RBC: 4.47 MIL/uL (ref 3.87–5.11)
RDW: 15 % (ref 11.5–15.5)
WBC: 28.4 10*3/uL — ABNORMAL HIGH (ref 4.0–10.5)
nRBC: 0 % (ref 0.0–0.2)

## 2022-06-20 LAB — RPR: RPR Ser Ql: NONREACTIVE

## 2022-06-20 MED ORDER — COCONUT OIL OIL: 1.0000 | TOPICAL_OIL | Status: DC | PRN

## 2022-06-20 MED ORDER — ACETAMINOPHEN 325 MG PO TABS
650.0000 mg | ORAL_TABLET | ORAL | Status: DC | PRN
  Administered 2022-06-20 – 2022-06-21 (×3): 650 mg via ORAL
  Filled 2022-06-20 (×3): qty 2

## 2022-06-20 MED ORDER — DIPHENHYDRAMINE HCL 25 MG PO CAPS: 25.0000 mg | ORAL_CAPSULE | Freq: Four times a day (QID) | ORAL | Status: DC | PRN

## 2022-06-20 MED ORDER — BENZOCAINE-MENTHOL 20-0.5 % EX AERO: 1.0000 | INHALATION_SPRAY | CUTANEOUS | Status: DC | PRN

## 2022-06-20 MED ORDER — LIDOCAINE-EPINEPHRINE (PF) 2 %-1:200000 IJ SOLN
INTRAMUSCULAR | Status: DC | PRN
  Administered 2022-06-19 – 2022-06-20 (×3): 2 mL via EPIDURAL

## 2022-06-20 MED ORDER — IBUPROFEN 600 MG PO TABS
600.0000 mg | ORAL_TABLET | Freq: Four times a day (QID) | ORAL | Status: DC
  Administered 2022-06-20 – 2022-06-22 (×8): 600 mg via ORAL
  Filled 2022-06-20 (×8): qty 1

## 2022-06-20 MED ORDER — ONDANSETRON HCL 4 MG/2ML IJ SOLN: 4.0000 mg | INTRAMUSCULAR | Status: DC | PRN

## 2022-06-20 MED ORDER — LIDOCAINE-EPINEPHRINE (PF) 1.5 %-1:200000 IJ SOLN: INTRAMUSCULAR | Status: DC | PRN

## 2022-06-20 MED ORDER — WITCH HAZEL-GLYCERIN EX PADS: 1.0000 | MEDICATED_PAD | CUTANEOUS | Status: DC | PRN

## 2022-06-20 MED ORDER — SODIUM CHLORIDE 0.9 % IV SOLN: INTRAVENOUS | Status: DC

## 2022-06-20 MED ORDER — PRENATAL MULTIVITAMIN CH
1.0000 | ORAL_TABLET | Freq: Every day | ORAL | Status: DC
  Administered 2022-06-20 – 2022-06-21 (×2): 1 via ORAL
  Filled 2022-06-20 (×2): qty 1

## 2022-06-20 MED ORDER — BENZOCAINE-MENTHOL 20-0.5 % EX AERO
1.0000 | INHALATION_SPRAY | Freq: Four times a day (QID) | CUTANEOUS | Status: DC | PRN
Start: 2022-06-20 — End: 2022-06-22
  Administered 2022-06-20: 1 via TOPICAL
  Filled 2022-06-20: qty 56

## 2022-06-20 MED ORDER — LIDOCAINE-EPINEPHRINE (PF) 2 %-1:200000 IJ SOLN: INTRAMUSCULAR | Status: DC | PRN

## 2022-06-20 MED ORDER — LIDOCAINE-EPINEPHRINE (PF) 2 %-1:200000 IJ SOLN
INTRAMUSCULAR | Status: DC | PRN
  Administered 2022-06-20: 2 mL

## 2022-06-20 MED ORDER — SIMETHICONE 80 MG PO CHEW: 80.0000 mg | CHEWABLE_TABLET | ORAL | Status: DC | PRN

## 2022-06-20 MED ORDER — MAGNESIUM SULFATE 40 GM/1000ML IV SOLN
2.0000 g/h | INTRAVENOUS | Status: AC
  Administered 2022-06-20: 2 g/h via INTRAVENOUS
  Filled 2022-06-20: qty 1000

## 2022-06-20 MED ORDER — TETANUS-DIPHTH-ACELL PERTUSSIS 5-2.5-18.5 LF-MCG/0.5 IM SUSY: 0.5000 mL | PREFILLED_SYRINGE | Freq: Once | INTRAMUSCULAR | Status: DC

## 2022-06-20 MED ORDER — DIBUCAINE (PERIANAL) 1 % EX OINT: 1.0000 | TOPICAL_OINTMENT | CUTANEOUS | Status: DC | PRN

## 2022-06-20 MED ORDER — ONDANSETRON HCL 4 MG PO TABS: 4.0000 mg | ORAL_TABLET | ORAL | Status: DC | PRN

## 2022-06-20 MED ORDER — BUPIVACAINE HCL (PF) 0.25 % IJ SOLN
INTRAMUSCULAR | Status: DC | PRN
  Administered 2022-06-19 – 2022-06-20 (×4): 3 mL via EPIDURAL

## 2022-06-20 MED ORDER — OXYTOCIN-SODIUM CHLORIDE 30-0.9 UT/500ML-% IV SOLN: 1.0000 m[IU]/min | INTRAVENOUS | Status: DC

## 2022-06-20 MED ORDER — TERBUTALINE SULFATE 1 MG/ML IJ SOLN: 0.2500 mg | Freq: Once | INTRAMUSCULAR | Status: DC | PRN

## 2022-06-20 MED ORDER — SENNOSIDES-DOCUSATE SODIUM 8.6-50 MG PO TABS
2.0000 | ORAL_TABLET | ORAL | Status: DC
  Filled 2022-06-20: qty 2

## 2022-06-20 MED ORDER — LACTATED RINGERS IV SOLN: INTRAVENOUS | Status: DC

## 2022-06-20 MED ORDER — ZOLPIDEM TARTRATE 5 MG PO TABS: 5.0000 mg | ORAL_TABLET | Freq: Every evening | ORAL | Status: DC | PRN

## 2022-06-20 MED ORDER — MAGNESIUM SULFATE 40 GM/1000ML IV SOLN
INTRAVENOUS | Status: AC
  Filled 2022-06-20: qty 1000

## 2022-06-20 NOTE — Lactation Note (Addendum)
This note was copied from a baby's chart. Lactation Consultation Note  Patient Name: Frances Mccormick [id number] Date: 06/20/2022 Reason for consult: L&D Initial assessment;Late-preterm 34-36.6wks Age:36 hours  P1, [redacted]w[redacted]d  Baby grunting.  Briefly spoke with mother.  Lactation will follow up later today.  Interventions Interventions: Education   Consult Status Consult Status: Follow-up from L&D    BVivianne MasterBSt. [facility]7/21/2023, 8:00 AM

## 2022-06-20 NOTE — Progress Notes (Addendum)
Patient ID: Frances Mccormick, female   DOB: [birth year], 36 y.o.   MRN: [id number] Feeling pressure  We had started Pitocin at [place] for decreased adequacy of labor and no change in cervix   Vitals:   06/19/22 2301 06/19/22 2331 06/19/22 2345 06/20/22 0031  BP: (!) 144/79 139/76  117/61  Pulse: (!) 113 (!) 115  (!) 117  Resp: 18     Temp: 98.1 F (36.7 C)     TempSrc: Oral     SpO2:   96%    IUPC inserted to better ascertain contractions UCs spaced out at times, not consistent in pattern 140 MVU/80mn FHR is reactive  Dilation: 6.5 Effacement (%): 80 Station: -2 Presentation: Vertex Exam by:: MHansel Feinstein CNM   A:  SIUP at 333w4d     Preeclampsia with severe features       Preterm labor       Hypotonic Uterine Dysfunction  P:   Continue to increase Pitocin       Continue MgSO4       Recheck PRN when labor pattern adequate

## 2022-06-20 NOTE — Lactation Note (Signed)
This note was copied from a baby's chart.  NICU Lactation Consultation Note  Patient Name: Frances Mccormick [id number] Date: 06/20/2022 Age:36 hours  Subjective Reason for consult: Follow-up assessment; Primapara; 1st time breastfeeding; NICU baby; Late-preterm 34-36.6wks; Other (Comment); Maternal endocrine disorder; Infant < 6lbs (AMA)  Visited with mom of 64 hours old LPI NICU female, she's a P1 and reports that she started pumping this afternoon but "nothing" came out, praised her for her efforts and explained that the purpose of pumping this early on is mainly for breast stimulation and not to get volume. Reviewed pumping schedule, lactogenesis II, benefits of premature milk and anticipatory guidelines.  Objective Infant data: Mother's Current Feeding Choice: Breast Milk and Donor Milk  Infant feeding assessment Scale for Readiness: 2 Scale for Quality: -- (Po attempt, didn't latch)  Maternal data: [id number]  Vaginal, Spontaneous Significant Breast History:: (+) breast changes Current breast feeding challenges:: NICU admission Does the patient have breastfeeding experience prior to this delivery?: No Pumping frequency: q 3 hours (recommended) Pumped volume: 0 mL Flange Size: 24 Risk factor for low milk supply:: primipara, AMA, infant separation WIC Program: Yes WIC Referral Sent?: Yes Pump:  (Asked OB Specialty care RN Chrys Racer to place an order for a Stork pump, mom has private insurance)  Assessment Infant: In NICU  Maternal: Milk volume: Normal  Intervention/Plan Interventions: Breast feeding basics reviewed; Education; [employer] brochure; DEBP Tools: Pump; Flanges Pump Education: Setup, frequency, and cleaning; Milk Storage  Plan of care: Encouraged mom to pump every 3 hours, at least 8 pumping sessions/24 hours Breast massage and hand expression were also encouraged prior pumping  No other support person at this time. All questions and concerns answered,  family to contact [facility] services PRN.  Consult Status: NICU follow-up NICU Follow-up type: New admission follow up; Maternal D/C visit; Verify onset of copious milk; Verify absence of engorgement   Frances Mccormick 06/20/2022, 5:32 PM

## 2022-06-20 NOTE — Discharge Summary (Signed)
Postpartum Discharge Summary  Date of Service updated-7/23     Patient Name: Frances Mccormick DOB: 11/17/1986 MRN: [id number]  Date of admission: 06/19/2022 Delivery date:06/20/2022  Delivering provider: Seabron Spates  Date of discharge: 06/22/2022  Admitting diagnosis: Preterm premature rupture of membranes (PPROM) with onset of labor within 24 hours of rupture in third trimester, antepartum [O42.013]  Intrauterine pregnancy: [redacted]w[redacted]d    Secondary diagnosis:  Principal Problem:   Preterm premature rupture of membranes (PPROM) with onset of labor within 24 hours of rupture in third trimester, antepartum Active Problems:   Crohn's disease ([place]   Chronic hypertension with superimposed preeclampsia   Supervision of high risk pregnancy, antepartum   Hyperthyroidism affecting pregnancy in first trimester   Alpha thalassemia silent carrier   Advanced maternal age in multigravida, second trimester   Preterm delivery after induction of labor  Additional problems: Severe Preeclampsia    Discharge diagnosis: Preterm Pregnancy Delivered and Preeclampsia (severe)                                              Post partum procedures: none Augmentation: AROM, Pitocin, Cytotec, and IP Foley Complications: None  Hospital course: Induction of Labor With Vaginal Delivery   36y.o. yo G3P0020 at 325w4das admitted to the hospital 06/19/2022 for induction of labor.  Indication for induction: Preeclampsia.  Patient had an uncomplicated labor course as follows: Membrane Rupture Time/Date: 10:00 AM ,06/19/2022   Delivery Method:Vaginal, Spontaneous  Episiotomy: None  Lacerations:  Labial  Details of delivery can be found in separate delivery note.  Due to preeclampsia with severe features, she received Magnesium x 24hrs.  BP remained stable and did not require any anti-hypertensive medication..  Patient is discharged home 06/22/22.  Newborn Data: Birth date:06/20/2022  Birth time:7:12 AM   Gender:Female  Living status:Living  Apgars:8 ,9  Weight:2460 g   Magnesium Sulfate received: Yes for severe preeclampsia BMZ received: Yes Rhophylac:N/A MMR:N/A T-DaP:Given prenatally Flu: N/A Transfusion:No  Physical exam  Vitals:   06/21/22 1524 06/21/22 2018 06/21/22 2335 06/22/22 0343  BP: 119/71 111/62 109/77 120/60  Pulse: 73 66 66 (!) 56  Resp: 17 16 16 17   Temp: 98.2 F (36.8 C) 98.1 F (36.7 C) 98.3 F (36.8 C) 98.4 F (36.9 C)  TempSrc: Oral Oral Oral Oral  SpO2: 99% 100% 100% 100%   General: alert, cooperative, and no distress Lochia: appropriate Uterine Fundus: firm Incision: N/A DVT Evaluation: No evidence of DVT seen on physical exam. Labs: Lab Results  Component Value Date   WBC 28.4 (H) 06/20/2022   HGB 11.4 (L) 06/20/2022   HCT 33.9 (L) 06/20/2022   MCV 75.8 (L) 06/20/2022   PLT 370 06/20/2022      Latest Ref Rng & Units 06/19/2022    1:35 PM  CMP  Glucose 70 - 99 mg/dL 97   BUN 6 - 20 mg/dL 7   Creatinine 0.44 - 1.00 mg/dL 0.64   Sodium 135 - 145 mmol/L 138   Potassium 3.5 - 5.1 mmol/L 3.6   Chloride 98 - 111 mmol/L 104   CO2 22 - 32 mmol/L 21   Calcium 8.9 - 10.3 mg/dL 8.8   Total Protein 6.5 - 8.1 g/dL 7.1   Total Bilirubin 0.3 - 1.2 mg/dL 0.5   Alkaline Phos 38 - 126 U/L 147  AST 15 - 41 U/L 20   ALT 0 - 44 U/L 16    Edinburgh Score:    06/21/2022   11:40 PM  Edinburgh Postnatal Depression Scale Screening Tool  I have been able to laugh and see the funny side of things. 1  I have looked forward with enjoyment to things. 0  I have blamed myself unnecessarily when things went wrong. 2  I have been anxious or worried for no good reason. 1  I have felt scared or panicky for no good reason. 1  Things have been getting on top of me. 2  I have been so unhappy that I have had difficulty sleeping. 0  I have felt sad or miserable. 2  I have been so unhappy that I have been crying. 1  The thought of harming myself has occurred to me.  0  Edinburgh Postnatal Depression Scale Total 10      After visit meds:  Allergies as of 06/22/2022       Reactions   Bee Venom Anaphylaxis   Ciprofloxacin Anaphylaxis        Medication List     STOP taking these medications    aspirin EC 81 MG tablet       TAKE these medications    acetaminophen 325 MG tablet Commonly known as: Tylenol Take 2 tablets (650 mg total) by mouth every 4 (four) hours as needed (for pain scale < 4).   cephALEXin 500 MG capsule Commonly known as: KEFLEX Take 1 capsule (500 mg total) by mouth 4 (four) times daily.   ibuprofen 600 MG tablet Commonly known as: ADVIL Take 1 tablet (600 mg total) by mouth every 6 (six) hours as needed.   norethindrone 0.35 MG tablet Commonly known as: Ortho Micronor Take 1 tablet (0.35 mg total) by mouth daily.   PNV-DHA+Docusate 27-1.25-300 MG Caps Take 1 capsule by mouth daily before breakfast.               Durable Medical Equipment  (From admission, onward)           Start     Ordered   06/20/22 1742  For home use only DME double electric breast pump  Once       Comments: Z39.1   06/20/22 1742             Discharge home in stable condition Infant Feeding: Breast Infant Disposition:NICU Discharge instruction: per After Visit Summary and Postpartum booklet. Activity: Advance as tolerated. Pelvic rest for 6 weeks.  Diet: routine diet Anticipated Birth Control: POPs Postpartum Appointment:1 week Additional Postpartum F/U: BP check 1 week Future Appointments: Future Appointments  Date Time Provider [place]  06/24/2022  1:30 PM [facility] NURSE [facility]  06/24/2022  1:45 PM [facility]  06/24/2022  4:10 PM Stinson, Tanna Savoy, DO [place] None   Follow up Visit:  Follow-up [place] Follow up.   Specialty: Obstetrics and Gynecology Why: Please follow up in one week for a BP check Contact information: [address] [place] [phone]                   06/22/2022 Annalee Genta, DO

## 2022-06-21 NOTE — Lactation Note (Signed)
This note was copied from a baby's chart.  NICU Lactation Consultation Note  Patient Name: Frances Mccormick GGYIR'S Date: 06/21/2022 Age:36 years  Subjective Reason for consult: Follow-up assessment; 1st time breastfeeding; Primapara; NICU baby; Late-preterm 34-36.6wks; Other (Comment); Maternal endocrine disorder; Infant < 6lbs (AMA)  Visited with mom of 71 hours old LPI NICU female, she's a P1 and reports she's been pumping (but not consistently) and still wondering if it's working because "nothing comes out". Explained to Ms. [name] the importance of consistent pumping for the onset of lactogenesis II and the prevention of engorgement, she voiced understanding. Assisted with hand expression and she was able to get a glistening drop of colostrum, praised her for her efforts.   Objective Infant data: Mother's Current Feeding Choice: Breast Milk and Donor Milk  Infant feeding assessment Scale for Readiness: 4 Scale for Quality: -- (Po attempt, didn't latch)  Maternal data: [id number]  Vaginal, Spontaneous Significant Breast History:: (+) breast changes Current breast feeding challenges:: NICU admission Does the patient have breastfeeding experience prior to this delivery?: No Pumping frequency: 3-4 times/24 hours Pumped volume: 0 mL (glistening drop of colostrum with hand expression) Flange Size: 24 Risk factor for low milk supply:: primipara, AMA, infant separation WIC Program: Yes WIC Referral Sent?: Yes Pump:  (Spoke to [employer], Stork pump was approved, awaiting delivery)  Assessment Infant: In NICU  Maternal: Milk volume: Normal  Intervention/Plan Interventions: Breast feeding basics reviewed; DEBP; Education Tools: Pump; Flanges Pump Education: Setup, frequency, and cleaning; Milk Storage  Plan of care: Encouraged mom to pump every 3 hours, at least 8 pumping sessions/24 hours Breast massage and hand expression were also encouraged prior pumping FOB will get  coconut oil to start using prior pumping Verify receipt of Stork pump   No other support person at this time. All questions and concerns answered, family to contact [facility] services PRN.  Consult Status: NICU follow-up NICU Follow-up type: Verify onset of copious milk; Verify absence of engorgement; Weekly NICU follow up   [employer] 06/21/2022, 3:46 PM

## 2022-06-21 NOTE — Anesthesia Postprocedure Evaluation (Signed)
Anesthesia Post Note  Patient: Frances Mccormick  Procedure(s) Performed: AN AD Phillips     Patient location during evaluation: Mother Baby Anesthesia Type: Epidural Level of consciousness: awake and alert Pain management: pain level controlled Vital Signs Assessment: post-procedure vital signs reviewed and stable Respiratory status: spontaneous breathing, nonlabored ventilation and respiratory function stable Cardiovascular status: stable Postop Assessment: no headache, no backache and epidural receding Anesthetic complications: no   No notable events documented.  Last Vitals:  Vitals:   06/21/22 1140 06/21/22 1524  BP: 103/61 119/71  Pulse: 60 73  Resp: 16 17  Temp: 36.6 C 36.8 C  SpO2: 99% 99%    Last Pain:  Vitals:   06/21/22 1524  TempSrc: Oral  PainSc:    Pain Goal: Patients Stated Pain Goal: 3 (06/20/22 2030)                 [employer]

## 2022-06-21 NOTE — Progress Notes (Signed)
Post Partum Day 1 SVD at 35 weeks with [facility] Subjective: Pt without complaints this morning. Ambulating, tolerating diet, good oral pain control. Denies HA or vision changes  Infant stable in NICU  Objective: Blood pressure 130/79, pulse 79, temperature 98 F (36.7 C), temperature source Oral, resp. rate 16, last menstrual period 10/14/2021, SpO2 100 %, unknown if currently breastfeeding.  Physical Exam:  General: alert Lochia: appropriate Uterine Fundus: firm Incision: NA DVT Evaluation: No evidence of DVT seen on physical exam.  Recent Labs    06/19/22 1335 06/20/22 0732  HGB 12.9 11.4*  HCT 37.9 33.9*    Assessment/Plan: Stable. BP stable without meds. Plan for discharge home tomorrow. Continue with progressive.    LOS: 2 days   Chancy Milroy, MD 06/21/2022, 11:37 AM

## 2022-06-21 NOTE — Progress Notes (Signed)
Attending Circumcision Counseling Progress Note  Patient desires circumcision for her female infant.  Circumcision procedure details discussed, risks and benefits of procedure were also discussed.  These include but are not limited to: Benefits of circumcision in men include reduction in the rates of urinary tract infection (UTI), penile cancer, some sexually transmitted infections, penile inflammatory and retractile disorders, as well as easier hygiene.  Risks include bleeding , infection, injury of glans which may lead to penile deformity or urinary tract issues, unsatisfactory cosmetic appearance and other potential complications related to the procedure.  It was emphasized that this is an elective procedure.  Patient wants to proceed with circumcision; written informed consent obtained.  Will do circumcision soon, routine circumcision and post circumcision care ordered for the infant.  Frances Mccormick, M.D. 06/21/2022 11:41 AM

## 2022-06-22 ENCOUNTER — Ambulatory Visit: Payer: Self-pay

## 2022-06-22 MED ORDER — ACETAMINOPHEN 325 MG PO TABS: 650.0000 mg | ORAL_TABLET | ORAL | Status: DC | PRN

## 2022-06-22 MED ORDER — NORETHINDRONE 0.35 MG PO TABS: 1.0000 | ORAL_TABLET | Freq: Every day | ORAL | 4 refills | Status: DC

## 2022-06-22 MED ORDER — IBUPROFEN 600 MG PO TABS: 600.0000 mg | ORAL_TABLET | Freq: Four times a day (QID) | ORAL | 0 refills | Status: DC | PRN

## 2022-06-22 NOTE — Lactation Note (Signed)
This note was copied from a baby's chart.  NICU Lactation Consultation Note  Patient Name: Frances Mccormick [id number] Date: 06/22/2022 Age:36 hours  Subjective Reason for consult: Follow-up assessment; Primapara; 1st time breastfeeding; NICU baby; Late-preterm 34-36.6wks; Other (Comment); Infant < 6lbs; Maternal endocrine disorder; Maternal discharge (AMA)  Visited with mom of 74 hours old LPI NICU female, she's a P1 getting discharge today. Reviewed discharge education, pumping settings, lactogenesis II and anticipatory guidelines. She reports pumping is going well and that since she started pumping every 3 hours, her supply has increased, she's seeing "more drops" now. Parents aware that lactation services are also available to help with latching and positioning when baby is ready.  Objective Infant data: Mother's Current Feeding Choice: Breast Milk and Donor Milk   Maternal data: [id number]  Vaginal, Spontaneous Pumping frequency: 6 times/24 hours Pumped volume: 0 mL (getting more drops) Flange Size: 24 WIC Program: Yes WIC Referral Sent?: Yes Pump: Stork Pump  Assessment Infant: In NICU  Maternal: Milk volume: Normal  Intervention/Plan Interventions: Breast feeding basics reviewed; DEBP; Education Tools: Pump; Flanges Pump Education: Setup, frequency, and cleaning; Milk Storage  Plan of care: Encouraged mom to continue pumping every 3 hours, at least 8 pumping sessions/24 hours Breast massage, hand expression and coconut oil were also encouraged prior pumping She'll take all pump parts to baby's room after her discharge She'll switch her pump settings from initiation to expression mode once she starts getting + 20 ml of EBM   No other support person at this time. All questions and concerns answered, family to contact [facility] services PRN.  Consult Status: NICU follow-up NICU Follow-up type: Verify onset of copious milk; Verify absence of engorgement; Weekly NICU follow  up   [employer] 06/22/2022, 1:04 PM

## 2022-06-22 NOTE — Clinical Social Work Maternal (Signed)
CLINICAL SOCIAL WORK MATERNAL/CHILD NOTE  Patient Details  Name: Frances Mccormick MRN: [id number] Date of Birth: Apr 06, 1986  Date:  06/22/2022  Clinical Social Worker Initiating Note:  Idamae Lusher Date/Time: Initiated:  06/22/22/1039     Child's Name:  Frances Mccormick   Biological Parents:  Mother, Father (MOB Frances Mccormick DOB: [birth year] 23[birth year] 1986, FOB Frances Mccormick DOB: 04/30/1976)   Need for Interpreter:  None   Reason for Referral:  Behavioral Health Concerns, Other (Comment) (NICU admission)   Address:  [place] [address]    Phone number:  [phone]  Additional phone number:   Household Members/Support Persons (HM/SP):   Household Member/Support Person 1   HM/SP Name Relationship DOB or Age  HM/SP -Falls Mother    HM/SP -2        HM/SP -3        HM/SP -4        HM/SP -5        HM/SP -6        HM/SP -7        HM/SP -8          Natural Supports (not living in the home):  Other (Comment), Extended Family (FOB)   Professional Supports: None   Employment: Part-time   Type of Work: Therapist, art Rep at [employer]:  [place] arranged:    Museum/gallery curator Resources:  [employer]   Other Resources:  [facility]   Cultural/Religious Considerations Which May Impact Care:  None identified  Strengths:  Ability to meet basic needs  , Home prepared for child     Psychotropic Medications:         Pediatrician:       Pediatrician List: List provided  [place]      Pediatrician Fax Number:    Risk Factors/Current Problems:  Mental Health Concerns   (EPDS score of 10)   Cognitive State:  Alert  , Goal Oriented  , Linear Thinking  , Able to Concentrate     Mood/Affect:  Calm  , Interested  , Comfortable  , Relaxed     CSW Assessment: CSW received consult for Edinburgh Postnatal Depression Scale score of 10. Infant has also  been admitted to the NICU due to grunting. CSW met with MOB to offer support and complete assessment.  When CSW entered room, MOB was alone. CSW introduced self and reason for consult. MOB was polite and remained engaged throughout assessment.  CSW inquired how MOB has felt emotionally since giving birth. MOB reports she is feeling okay but shares that she is feeling sad that she is going home but her baby is not. MOB appeared tearful while sharing about infant being in the NICU. CSW validated and normalized MOB's feelings and provided emotional support. CSW reviewed feelings and emotions associated with a NICU admission. MOB shares she is coming to accept that her infant is where he needs to be to receive the care that he needs, sharing in a way she is glad that he is not going home because she wouldn't be able to appropriately care for him at this time due to his health concerns. MOB did not demonstrate any acute mental health symptoms.   CSW inquired about MOB's mental health history. MOB denied a mental health history, sharing she has never been diagnosed with  a mental health disorder or prescribed psychotropic medication. MOB shares she attended therapy on one occasion in high school but does not recall why. MOB identified FOB and her mother as supports. MOB denied current SI/HI/DV. CSW provided education regarding the baby blues period vs. perinatal mood disorders, discussed treatment and offered resources for mental health follow up if concerns arise. MOB declined mental health resources at this time but did inquire about NICU support groups, sharing that it would be helpful to talk with other moms who have gone through similar experiences. CSW will provide MOB with support group resources for NICU parents. SW recommends self-evaluation during the postpartum time period using the New Mom Checklist from Postpartum Progress and encouraged MOB to contact a medical professional if symptoms are noted at any  time.   MOB reports she has all needed items for baby, including a car seat and bassinet. MOB has not chosen a pediatrician but states she was provided with a list.   CSW and MOB discussed infant's NICU admission. MOB reports she is is feeling well informed about infant's care during his stay in the NICU. CSW informed MOB about the NICU, what to expect, and information about resources and supports available while infant is admitted to the NICU. CSW explained that MOB will be assigned a Education officer, museum while infant is admitted. MOB provided verbal consent for CSW to check in with MOB during infant's NICU admission. MOB denied any transportation barriers during infant's NICU admission, sharing her mother and cousin can provide transportation until MOB is cleared to drive. CSW also informed MOB about Medicaid transportation and provided Medicaid Transportation contact information  per MOB's request. MOB reported that meal vouchers would be helpful. CSW explained that once MOB is discharged, CSW can provide meal vouchers. MOB denied any questions/concerns regarding the NICU.  CSW did not provide review of Sudden Infant Death Syndrome (SIDS) precautions due to infant being in the NICU.   CSW identifies no barriers to discharge at this time.CSW will continue to offer support and resources to family while infant remains in NICU.    CSW Plan/Description:   Psychosocial Support and Ongoing Assessment of Needs, No Further Intervention Required/No Barriers to Discharge, Perinatal Mood and Anxiety Disorder (PMADs) Education, Other Information/Referral to [place], [place] 06/22/2022, 10:48 AM

## 2022-06-22 NOTE — Progress Notes (Signed)
Discussed Rubella non Immunity. Patient given CDC information sheet regarding immunization. Patient stated understanding and refused vaccine @ this time.

## 2022-06-23 ENCOUNTER — Telehealth (HOSPITAL_COMMUNITY): Payer: Self-pay | Admitting: *Deleted

## 2022-06-23 DIAGNOSIS — Z1331 Encounter for screening for depression: Secondary | ICD-10-CM

## 2022-06-23 NOTE — Telephone Encounter (Signed)
Inpatient EPDS score on 06/21/22 = 10. Referral generated for [place] and sent to Dr. Mora Bellman. Erline Levine, RN, 06/23/22, [phone]

## 2022-06-24 ENCOUNTER — Ambulatory Visit: Payer: 59

## 2022-06-24 ENCOUNTER — Encounter: Payer: 59 | Admitting: Family Medicine

## 2022-06-24 LAB — SURGICAL PATHOLOGY

## 2022-06-25 ENCOUNTER — Encounter (HOSPITAL_COMMUNITY): Payer: Self-pay | Admitting: Family Medicine

## 2022-06-27 ENCOUNTER — Ambulatory Visit (INDEPENDENT_AMBULATORY_CARE_PROVIDER_SITE_OTHER): Payer: 59

## 2022-06-27 VITALS — BP 120/82 | HR 66 | Wt 211.9 lb

## 2022-06-27 DIAGNOSIS — Z3491 Encounter for supervision of normal pregnancy, unspecified, first trimester: Secondary | ICD-10-CM

## 2022-06-27 DIAGNOSIS — Z8759 Personal history of other complications of pregnancy, childbirth and the puerperium: Secondary | ICD-10-CM

## 2022-06-27 DIAGNOSIS — Z3A35 35 weeks gestation of pregnancy: Secondary | ICD-10-CM

## 2022-06-27 MED ORDER — PNV-DHA+DOCUSATE 27-1.25-300 MG PO CAPS: 1.0000 | ORAL_CAPSULE | Freq: Every day | ORAL | 4 refills | Status: DC

## 2022-06-27 NOTE — Progress Notes (Signed)
..  Subjective:  Frances Mccormick is a 36 y.o. female here for BP check. Pt reports hx of preeclampsia, not currently taking any BP medications. Reports increased blurred vision in the last 2 days.  Hypertension ROS: no TIA's, no chest pain on exertion, no dyspnea on exertion, no swelling of ankles, and  Increased Blurry vision in the last 2 days .    Objective:  BP 120/82   Pulse 66   Wt 211 lb 14.4 oz (96.1 kg)   LMP 10/14/2021 (Exact Date)   Breastfeeding Yes   BMI 41.38 kg/m   Appearance alert, well appearing, and in no distress. General exam BP noted to be well controlled today in office.    Assessment:   Blood Pressure stable.   Plan:  Consulted with in office provider and will have blood work done today for CMP and CBC. Will follow up with patient to discuss results .

## 2022-06-28 LAB — COMPREHENSIVE METABOLIC PANEL
ALT: 27 IU/L (ref 0–32)
AST: 20 IU/L (ref 0–40)
Albumin/Globulin Ratio: 1.2 (ref 1.2–2.2)
Albumin: 3.6 g/dL — ABNORMAL LOW (ref 3.9–4.9)
Alkaline Phosphatase: 99 IU/L (ref 44–121)
BUN/Creatinine Ratio: 12 (ref 9–23)
BUN: 10 mg/dL (ref 6–20)
Bilirubin Total: 0.3 mg/dL (ref 0.0–1.2)
CO2: 21 mmol/L (ref 20–29)
Calcium: 9.2 mg/dL (ref 8.7–10.2)
Chloride: 105 mmol/L (ref 96–106)
Creatinine, Ser: 0.81 mg/dL (ref 0.57–1.00)
Globulin, Total: 3.1 g/dL (ref 1.5–4.5)
Glucose: 83 mg/dL (ref 70–99)
Potassium: 4.5 mmol/L (ref 3.5–5.2)
Sodium: 141 mmol/L (ref 134–144)
Total Protein: 6.7 g/dL (ref 6.0–8.5)
eGFR: 96 mL/min/{1.73_m2} (ref >59)

## 2022-06-28 LAB — CBC
Hematocrit: 38.7 % (ref 34.0–46.6)
Hemoglobin: 12.4 g/dL (ref 11.1–15.9)
MCH: 25.1 pg — ABNORMAL LOW (ref 26.6–33.0)
MCHC: 32 g/dL (ref 31.5–35.7)
MCV: 78 fL — ABNORMAL LOW (ref 79–97)
Platelets: 255 10*3/uL (ref 150–450)
RBC: 4.95 x10E6/uL (ref 3.77–5.28)
RDW: 14.7 % (ref 11.7–15.4)
WBC: 12.2 10*3/uL — ABNORMAL HIGH (ref 3.4–10.8)

## 2022-06-28 NOTE — Progress Notes (Signed)
Agree with nurses's documentation of this patient's clinic encounter.  Chancy Milroy, MD

## 2022-07-14 ENCOUNTER — Inpatient Hospital Stay (HOSPITAL_COMMUNITY)
Admission: AD | Admit: 2022-07-14 | Discharge: 2022-07-14 | Disposition: A | Discharge status: Home / Self Care | Payer: 59 | Attending: Obstetrics and Gynecology | Admitting: Obstetrics and Gynecology

## 2022-07-14 ENCOUNTER — Telehealth: Payer: Self-pay | Admitting: *Deleted

## 2022-07-14 DIAGNOSIS — O1205 Gestational edema, complicating the puerperium: Secondary | ICD-10-CM | POA: Diagnosis present

## 2022-07-14 LAB — URINALYSIS, ROUTINE W REFLEX MICROSCOPIC
Bilirubin Urine: NEGATIVE
Glucose, UA: NEGATIVE mg/dL
Ketones, ur: NEGATIVE mg/dL
Nitrite: NEGATIVE
Protein, ur: NEGATIVE mg/dL
RBC / HPF: >50 RBC/hpf — ABNORMAL HIGH (ref 0–5)
Specific Gravity, Urine: 1.013 (ref 1.005–1.030)
pH: 6 (ref 5.0–8.0)

## 2022-07-14 MED ORDER — FUROSEMIDE 20 MG PO TABS: 20.0000 mg | ORAL_TABLET | Freq: Two times a day (BID) | ORAL | 0 refills | Status: DC

## 2022-07-14 NOTE — Telephone Encounter (Signed)
TC from pt reporting postpartum edema that has not resolved and is getting worse. Preterm delivery at 35.4 wks with severe Pre E. Reports BP's WNL, but swelling has increased to the point of having to wear different shoes. Reports swelling never decreased postpartum. Advised to seek care in MAU for suspected Pre E postpartum complications.

## 2022-07-14 NOTE — MAU Provider Note (Signed)
Event Date/Time   First Provider Initiated Contact with Patient 07/14/22 2133     S [name] is a 36 y.o. [phone] non-pregnant female at 3 weeks postpartum who presents to MAU today with complaint of worsening bilateral leg swelling. Had her baby at 35wks due to [place] with SIPE, was discharged without any hypertensive meds since she was normotensive after cessation of magnesium. Baby was sent to [facility] NICU at 29 days old, is still there. Has been trying to keep her legs elevated but this is difficult with NICU visits and travel. Denies leg pain other than discomfort from swelling, chest pain, cough, headache, visual disturbances or epigastric pain. No other physical complaints. Reports all BPs at home have been normotensive.  Receives care at [facility]. Prenatal records reviewed. Was discharged without short lasix course.  Pertinent items noted in HPI and remainder of comprehensive ROS otherwise negative.   O BP 129/87 (BP Location: Right Arm)   Pulse 91   Temp 99.3 F (37.4 C) (Oral)   Resp 20   Ht 5' (1.524 m)   Wt 216 lb 8 oz (98.2 kg)   BMI 42.28 kg/m  Physical Exam Constitutional:      General: She is not in acute distress.    Appearance: Normal appearance. She is not ill-appearing.  HENT:     Head: Normocephalic and atraumatic.  Eyes:     Pupils: Pupils are equal, round, and reactive to light.  Cardiovascular:     Rate and Rhythm: Normal rate and regular rhythm.  Pulmonary:     Effort: Pulmonary effort is normal.  Abdominal:     Palpations: Abdomen is soft.  Musculoskeletal:        General: Swelling (equal bilateral lower leg edema, up to her knees) present. No tenderness. Normal range of motion.     Right lower leg: 3+ Edema present.     Left lower leg: 3+ Edema present.     Comments: Negative Homan's sign on both legs. No s/sx of phlebitis or DVT.  Skin:    General: Skin is warm and dry.     Capillary Refill: Capillary refill takes less than 2 seconds.   Neurological:     Mental Status: She is alert and oriented to person, place, and time.  Psychiatric:        Mood and Affect: Mood normal.        Behavior: Behavior normal.        Thought Content: Thought content normal.        Judgment: Judgment normal.    MDM/MAU Course: Seen in family room due to MAU acuity. Homan's sign negative, edema is equal bilaterally. Edema never receded from discharge, just worsened. Explained that she likely needs a short course of lasix to help her recover from the effects of IV fluids and magnesium. Gave strong DVT precautions and advised return if she has severe leg pain with any redness or unilateral swelling, cough, chest pain, etc. Patient expressed understanding.   A Postpartum edema  P Discharge from MAU in stable condition with return precautions for DVT Follow up at [facility] as scheduled for ongoing prenatal care  Allergies as of 07/14/2022       Reactions   Bee Venom Anaphylaxis   Ciprofloxacin Anaphylaxis        Medication List     STOP taking these medications    cephALEXin 500 MG capsule Commonly known as: Pooler these medications  acetaminophen 325 MG tablet Commonly known as: Tylenol Take 2 tablets (650 mg total) by mouth every 4 (four) hours as needed (for pain scale < 4).   furosemide 20 MG tablet Commonly known as: Lasix Take 1 tablet (20 mg total) by mouth 2 (two) times daily for 5 days. Can decrease to daily if needed (if BP drops or breastmilk supply feels like it's being affected)   ibuprofen 600 MG tablet Commonly known as: ADVIL Take 1 tablet (600 mg total) by mouth every 6 (six) hours as needed.   norethindrone 0.35 MG tablet Commonly known as: Ortho Micronor Take 1 tablet (0.35 mg total) by mouth daily.   PNV-DHA+Docusate 27-1.25-300 MG Caps Take 1 capsule by mouth daily before breakfast.       Gabriel Carina, CNM 07/14/2022 9:52 PM

## 2022-07-14 NOTE — Discharge Instructions (Signed)
Midwife Jam's Lactation Bars Preheat oven to 350 degrees and grease a 9x13 pan Combine your fats and sugars: - 1c salted butter - 1/2-3/4c peanut butter - 2 eggs - 2tbsp flaxmeal in 4tbsp water - 1tsp vanilla - 1c white sugar - 1c brown sugar Mix the following dry ingredients in a separate bowl: - 2c flour - 1tsp baking soda - 1tsp salt Slowly add the dry into the wet mix. Once well combined, stir in the following: - 3c rolled oats  - 1c (or more) chocolate chips  Bake for 87mn.  Can sub almond butter and add 1tsp cinnamon and raisins or dried cranberries.

## 2022-07-14 NOTE — MAU Note (Signed)
Pt says she del vag on 06-20-2022 Baby is in NICU BP's at home are normal No meds  But her swelling in feet and legs are worse  Denies H/A , blurred vision  or epigastric pain

## 2022-07-25 ENCOUNTER — Telehealth: Payer: Self-pay

## 2022-07-25 NOTE — Telephone Encounter (Signed)
Called patient to inform her that we have completed her short term disability formed and that it will be faxed today 07/25/22.

## 2022-08-11 ENCOUNTER — Encounter: Payer: Self-pay | Admitting: Obstetrics and Gynecology

## 2022-08-11 ENCOUNTER — Ambulatory Visit (INDEPENDENT_AMBULATORY_CARE_PROVIDER_SITE_OTHER): Payer: 59 | Admitting: Obstetrics and Gynecology

## 2022-08-11 DIAGNOSIS — Z30011 Encounter for initial prescription of contraceptive pills: Secondary | ICD-10-CM

## 2022-08-11 MED ORDER — NORETHIN ACE-ETH ESTRAD-FE 1-20 MG-MCG(24) PO TABS: 1.0000 | ORAL_TABLET | Freq: Every day | ORAL | 11 refills | Status: DC

## 2022-08-11 NOTE — Progress Notes (Signed)
[facility] Partum Visit Note  COLLEENA KURTENBACH is a 36 y.o. [phone] female who presents for a postpartum visit. She is 7 weeks postpartum following a normal spontaneous vaginal delivery.  I have fully reviewed the prenatal and intrapartum course. The delivery was at 35w gestational weeks.  Anesthesia: epidural. Postpartum course has been uncomplicated. Baby is doing well. Baby is feeding by bottle - Similac Neosure. Bleeding no bleeding. Bowel function is normal. Bladder function is normal. Patient is not sexually active. Contraception method is oral progesterone-only contraceptive. Postpartum depression screening: negative.   The pregnancy intention screening data noted above was reviewed. Potential methods of contraception were discussed. The patient elected to proceed with No data recorded.   Edinburgh Postnatal Depression Scale - 08/11/22 1101       Edinburgh Postnatal Depression Scale:  In the Past 7 Days   I have been able to laugh and see the funny side of things. 0    I have looked forward with enjoyment to things. 0    I have blamed myself unnecessarily when things went wrong. 2    I have been anxious or worried for no good reason. 1    I have felt scared or panicky for no good reason. 1    Things have been getting on top of me. 0    I have been so unhappy that I have had difficulty sleeping. 0    I have felt sad or miserable. 0    I have been so unhappy that I have been crying. 0    The thought of harming myself has occurred to me. 0    Edinburgh Postnatal Depression Scale Total 4             Health Maintenance Due  Topic Date Due   COVID-19 Vaccine (1) Never done   INFLUENZA VACCINE  Never done    The following portions of the patient's history were reviewed and updated as appropriate: allergies, current medications, past family history, past medical history, past social history, past surgical history, and problem list.  Review of Systems Pertinent items are noted in  HPI.  Objective:  BP 118/76   Pulse 73   Ht 5' (1.524 m)   Wt 211 lb 3.2 oz (95.8 kg)   Breastfeeding No   BMI 41.25 kg/m    General:  alert, cooperative, no distress, and mildly obese   Breasts:  not indicated  Lungs: clear to auscultation bilaterally  Heart:  regular rate and rhythm  Abdomen: soft, non-tender; bowel sounds normal; no masses,  no organomegaly   Wound N/a  GU exam:  not indicated       Assessment:    Encounter for postpartum exam  normal postpartum exam.   Plan:   Essential components of care per ACOG recommendations:  1.  Mood and well being: Patient with negative depression screening today. Reviewed local resources for support.  - Patient tobacco use? No.   - hx of drug use? No.    2. Infant care and feeding:  -Patient currently breastmilk feeding? No.  -Social determinants of health (SDOH) reviewed in EPIC. No concerns. The following needs were identified: infant still in NICU, pt coping well  3. Sexuality, contraception and birth spacing - Patient does not want a pregnancy in the next year.  Desired family size is 2 children.  - Reviewed reproductive life planning. Reviewed contraceptive methods based on pt preferences and effectiveness.  Patient desired Oral Contraceptive today.  Since  she is no longer pumping or breastfeeding will convert to traditional OCP.  Rx for loestrin 24 sent.  She will start at completion of current POP.  BP check in 2 months. - Discussed birth spacing of 18 months  4. Sleep and fatigue -Encouraged family/partner/community support of 4 hrs of uninterrupted sleep to help with mood and fatigue  5. Physical Recovery  - Discussed patients delivery and complications. She describes her labor as good. - Patient had a Vaginal, no problems at delivery. Patient had a  periclitoral  laceration. Perineal healing reviewed. Patient expressed understanding. Unfortunately, infant still in NICU with feeding issues; however, the patient is  doing well. - Patient has urinary incontinence? No. - Patient is safe to resume physical and sexual activity  6.  Health Maintenance - HM due items addressed Yes - Last pap smear  Diagnosis  Date Value Ref Range Status  01/02/2022   Final   - Negative for intraepithelial lesion or malignancy (NILM)   Pap smear not done at today's visit.  -Breast Cancer screening indicated? No.   7. Chronic Disease/Pregnancy Condition follow up: Hypertension   Griffin Basil, MD [facility].

## 2022-08-13 ENCOUNTER — Ambulatory Visit: Payer: 59 | Admitting: Obstetrics and Gynecology

## 2022-10-03 ENCOUNTER — Ambulatory Visit (INDEPENDENT_AMBULATORY_CARE_PROVIDER_SITE_OTHER): Payer: Medicaid Other | Admitting: Licensed Clinical Social Worker

## 2022-10-03 DIAGNOSIS — F4321 Adjustment disorder with depressed mood: Secondary | ICD-10-CM

## 2022-10-06 NOTE — BH Specialist Note (Signed)
Integrated Behavioral Health via Telemedicine Visit  10/06/2022 CHANDY TARMAN [id number]  Number of [place] Clinician visits: 1 Session Start time: 900am  Session End time: 937am Total time in minutes: 37 mins via mychart video   Referring Provider: Dr. Elgie Congo  Patient/Family location: Home  [facility] Provider location: [place]  All persons participating in visit: PT S Goncalves and LCSW A. Tisha Cline  Types of Service: Individual psychotherapy and Video visit  I connected with Kirstie Peri and/or Kimberly via  Telephone or Video Enabled Telemedicine Application  (Video is Caregility application) and verified that I am speaking with the correct person using two identifiers. Discussed confidentiality: Yes   I discussed the limitations of telemedicine and the availability of in person appointments.  Discussed there is a possibility of technology failure and discussed alternative modes of communication if that failure occurs.  I discussed that engaging in this telemedicine visit, they consent to the provision of behavioral healthcare and the services will be billed under their insurance.  Patient and/or legal guardian expressed understanding and consented to Telemedicine visit: Yes   Presenting Concerns: Patient and/or family reports the following symptoms/concerns: grief associated with unexpected death of parent, newborn son in nicu, depressed mood, feelings of guilt,  Duration of problem: Since October ; Severity of problem: mild  Patient and/or Family's Strengths/Protective Factors: Concrete supports in place (healthy food, safe environments, etc.)  Goals Addressed: Patient will:  Reduce symptoms of: depression   Increase knowledge and/or ability of: coping skills   Demonstrate ability to: Increase healthy adjustment to current life circumstances, Increase adequate support systems for patient/family, and Begin healthy grieving over loss  Progress  towards Goals: Ongoing  Interventions: Interventions utilized:  Motivational Interviewing and Supportive Counseling Standardized Assessments completed: Not Needed  Patient and/or Family Response: Ms. Franko reports stress due to newborn currently in NICU and mother unexpected passing in Oct 2023. Ms. Ueda reports her mother was a huge support.  Assessment: Patient currently experiencing adjustment disorder with depressed mood.   Patient may benefit from integrated behavioral health.  Plan: Follow up with behavioral health clinician on : 10/13/2022 Behavioral recommendations: Prioritize rest, collaborate with nicu social workers for added support and resources, continue close relationship with aunt and family during times of grief  Referral(s): [facility] (In Clinic)  I discussed the assessment and treatment plan with the patient and/or parent/guardian. They were provided an opportunity to ask questions and all were answered. They agreed with the plan and demonstrated an understanding of the instructions.   They were advised to call back or seek an in-person evaluation if the symptoms worsen or if the condition fails to improve as anticipated.  Lynnea Ferrier, LCSW

## 2022-10-13 ENCOUNTER — Ambulatory Visit (INDEPENDENT_AMBULATORY_CARE_PROVIDER_SITE_OTHER): Payer: Medicaid Other | Admitting: Licensed Clinical Social Worker

## 2022-10-13 ENCOUNTER — Ambulatory Visit (INDEPENDENT_AMBULATORY_CARE_PROVIDER_SITE_OTHER): Payer: Medicaid Other | Admitting: General Practice

## 2022-10-13 VITALS — BP 108/69 | HR 64 | Ht 60.0 in | Wt 217.5 lb

## 2022-10-13 DIAGNOSIS — F4321 Adjustment disorder with depressed mood: Secondary | ICD-10-CM | POA: Diagnosis not present

## 2022-10-13 DIAGNOSIS — I159 Secondary hypertension, unspecified: Secondary | ICD-10-CM

## 2022-10-13 NOTE — Progress Notes (Signed)
Subjective:  Frances Mccormick is a 36 y.o. female here for BP check.   Hypertension ROS: no medication side effects noted, no TIA's, no chest pain on exertion, no dyspnea on exertion, and no swelling of ankles.    Objective:  There were no vitals taken for this visit.  Appearance alert, well appearing, and in no distress and oriented to person, place, and time. General exam BP noted to be well controlled today in office.    Assessment:   Blood Pressure stable.   Plan:  Current treatment plan is effective, no change in therapy.Marland Kitchen

## 2022-10-14 NOTE — BH Specialist Note (Signed)
Integrated Behavioral Health Follow Up In-Person Visit  MRN: [id number] Name: Frances Mccormick  Number of [place] Clinician visits: 1 Session Start time:  930am Session End time: 10:15am Total time in minutes: 45 mins in person   Types of Service: Individual psychotherapy  Interpretor:No. Interpretor Name and Language: None  Subjective: Frances Mccormick is a 36 y.o. female accompanied by n/a Patient was referred by Dr. Elgie Congo for positive depression screen. Patient reports the following symptoms/concerns: adjustment disorder with depressed  Duration of problem: Oct 2023; Severity of problem: mild  Objective: Mood: Good  and Affect: Appropriate Risk of harm to self or others: No plan to harm self or others  Life Context: Family and Social: Mother unexpectedly pass 08/2022. Newborn in NICU  School/Work: n/a Self-Care: n/a Life Changes: Newborn in NICU  Patient and/or Family's Strengths/Protective Factors: Concrete supports in place (healthy food, safe environments, etc.)  Goals Addressed: Patient will:  Reduce symptoms of: adjustment disorder    Increase knowledge and/or ability of: coping skills   Demonstrate ability to: Increase healthy adjustment to current life circumstances and Increase adequate support systems for patient/family  Progress towards Goals: Ongoing  Interventions: Interventions utilized:  Motivational Interviewing and Supportive Counseling Standardized Assessments completed: Not Needed  Patient and/or Family Response: [name] reports family is supportive with newborn arrival and death of mother. [name] is encourage to take time for self care and stages of grief. [name] is looking forward to newborn coming home   Assessment: Patient currently experiencing adjustment disorder with depressed mood.   Patient may benefit from integrated behavioral health.  Plan: Follow up with behavioral health clinician on : as  needed Behavioral recommendations: rioritize rest, collaborate with nicu social workers for added support and resources, continue close relationship with aunt and family during times of grief   Referral(s): Frances Mccormick (In Clinic) "From scale of 1-10, how likely are you to follow plan?":    Frances Ferrier, LCSW

## 2022-10-20 DIAGNOSIS — J069 Acute upper respiratory infection, unspecified: Secondary | ICD-10-CM | POA: Diagnosis not present

## 2022-10-20 DIAGNOSIS — R509 Fever, unspecified: Secondary | ICD-10-CM | POA: Diagnosis not present

## 2022-10-20 DIAGNOSIS — Z6841 Body Mass Index (BMI) 40.0 and over, adult: Secondary | ICD-10-CM | POA: Diagnosis not present

## 2022-10-20 DIAGNOSIS — R051 Acute cough: Secondary | ICD-10-CM | POA: Diagnosis not present

## 2022-10-20 DIAGNOSIS — J00 Acute nasopharyngitis [common cold]: Secondary | ICD-10-CM | POA: Diagnosis not present
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
## 2023-06-04 ENCOUNTER — Encounter (HOSPITAL_BASED_OUTPATIENT_CLINIC_OR_DEPARTMENT_OTHER): Payer: Self-pay | Admitting: Emergency Medicine

## 2023-06-04 ENCOUNTER — Emergency Department (HOSPITAL_BASED_OUTPATIENT_CLINIC_OR_DEPARTMENT_OTHER)
Admission: EM | Admit: 2023-06-04 | Discharge: 2023-06-04 | Disposition: A | Discharge status: Home / Self Care | Payer: Medicaid Other | Attending: Emergency Medicine | Admitting: Emergency Medicine

## 2023-06-04 ENCOUNTER — Other Ambulatory Visit: Payer: Self-pay

## 2023-06-04 DIAGNOSIS — J029 Acute pharyngitis, unspecified: Secondary | ICD-10-CM | POA: Diagnosis present

## 2023-06-04 DIAGNOSIS — J020 Streptococcal pharyngitis: Secondary | ICD-10-CM | POA: Diagnosis not present

## 2023-06-04 LAB — GROUP A STREP BY PCR: Group A Strep by PCR: DETECTED — AB

## 2023-06-04 MED ORDER — ACETAMINOPHEN 500 MG PO TABS
1000.0000 mg | ORAL_TABLET | Freq: Once | ORAL | Status: AC
  Administered 2023-06-04: 1000 mg via ORAL
  Filled 2023-06-04: qty 2

## 2023-06-04 MED ORDER — AMOXICILLIN 500 MG PO CAPS: 500.0000 mg | ORAL_CAPSULE | Freq: Two times a day (BID) | ORAL | 0 refills | Status: AC

## 2023-06-04 MED ORDER — KETOROLAC TROMETHAMINE 15 MG/ML IJ SOLN
15.0000 mg | Freq: Once | INTRAMUSCULAR | Status: AC
  Administered 2023-06-04: 15 mg via INTRAMUSCULAR
  Filled 2023-06-04: qty 1

## 2023-06-04 MED ORDER — DEXAMETHASONE 4 MG PO TABS
8.0000 mg | ORAL_TABLET | Freq: Once | ORAL | Status: AC
  Administered 2023-06-04: 8 mg via ORAL
  Filled 2023-06-04: qty 2

## 2023-06-04 NOTE — ED Provider Notes (Signed)
[place] [facility] Provider Note   CSN: [id number] Arrival date & time: 06/04/23  4098     History Chief Complaint  Patient presents with   Sore Throat    HPI Frances Mccormick is a 37 y.o. female presenting for chief complaint of sore throat.  She denies fevers chills nausea vomiting shortness of breath.  Having difficulty swallowing due to severity of pharyngitis is present for 3 days and progressive endorses tender lymphadenopathy.  Denies any cough.  Patient's recorded medical, surgical, social, medication list and allergies were reviewed in the Snapshot window as part of the initial history.   Review of Systems   Review of Systems  Constitutional:  Negative for chills, diaphoresis and fever.  HENT:  Positive for sore throat. Negative for ear pain.   Eyes:  Negative for pain and visual disturbance.  Respiratory:  Negative for cough and shortness of breath.   Cardiovascular:  Negative for chest pain and palpitations.  Gastrointestinal:  Negative for abdominal pain and vomiting.  Genitourinary:  Negative for dysuria and hematuria.  Musculoskeletal:  Negative for arthralgias and back pain.  Skin:  Negative for color change and rash.  Neurological:  Negative for seizures and syncope.  All other systems reviewed and are negative.   Physical Exam Updated Vital Signs BP 123/77 (BP Location: Right Arm)   Pulse 80   Temp 99.2 F (37.3 C) (Oral)   Resp 18   Ht 5' (1.524 m)   LMP 06/04/2023   SpO2 100%   Breastfeeding No   BMI 42.48 kg/m  Physical Exam   ED Course/ Medical Decision Making/ A&P    Procedures Procedures   Medications Ordered in ED Medications  acetaminophen (TYLENOL) tablet 1,000 mg (1,000 mg Oral Given 06/04/23 1947)  ketorolac (TORADOL) 15 MG/ML injection 15 mg (15 mg Intramuscular Given 06/04/23 1948)  dexamethasone (DECADRON) tablet 8 mg (8 mg Oral Given 06/04/23 1947)   Medical Decision Making:   Frances Mccormick is a 37 y.o. female who presented to the ED today with subjective fever, cough, congestion detailed above.     Complete initial physical exam performed, notably the patient  was hemodynamically stable in no acute distress.  Posterior oropharynx illuminated and without obvious swelling or deformity.  Patient is without neck stiffness.    Reviewed and confirmed nursing documentation for past medical history, family history, social history.    Initial Assessment:   With the patient's presentation of pharyngitis, most likely diagnosis is developing viral upper respiratory infection. Other diagnoses were considered including (but not limited to) peritonsillar abscess, retropharyngeal abscess, pneumonia. These are considered less likely due to history of present illness and physical exam findings.   This is most consistent with an acute life/limb threatening illness complicated by underlying chronic conditions. Considered meningitis, however patient's symptoms, vital signs, physical exam findings including lack of meningismus seem grossly less consistent at this time. Initial Plan:  Screening labs including CBC and Metabolic panel to evaluate for infectious or metabolic etiology of disease.  Empiric treatment with antipyretics including acetaminophen in ambulatory setting Will augment with dose of ketorolac in ED  As patient has sore throat, CENTOR Score dictates the following evaluation: RST Additionally will treat sore throat with dexamethasone dose Objective evaluation as below reviewed   Initial Study Results:   Laboratory  All laboratory results reviewed without evidence of clinically relevant pathology.   Exceptions include: + Strep test    Final Assessment and Plan:  On reassessment, patient is ambulatory tolerating p.o. intake in no acute distress. Will treat her streptococcal infection with amoxicillin recommend follow-up with PCP to ensure clearance. Patient is currently stable  for outpatient care and management with no indication for hospitalization or transfer at this time.  Discussed all findings with patient expressed understanding.  Disposition:  Based on the above findings, I believe patient is stable for discharge.    Patient/family educated about specific return precautions for given chief complaint and symptoms.  Patient/family educated about follow-up with PCP.     Patient/family expressed understanding of return precautions and need for follow-up. Patient spoken to regarding all imaging and laboratory results and appropriate follow up for these results. All education provided in verbal form with additional information in written form. Time was allowed for answering of patient questions. Patient discharged.    Emergency Department Medication Summary:   Medications  acetaminophen (TYLENOL) tablet 1,000 mg (1,000 mg Oral Given 06/04/23 1947)  ketorolac (TORADOL) 15 MG/ML injection 15 mg (15 mg Intramuscular Given 06/04/23 1948)  dexamethasone (DECADRON) tablet 8 mg (8 mg Oral Given 06/04/23 1947)          Clinical Impression:  1. Strep throat      Discharge   Final Clinical Impression(s) / ED Diagnoses Final diagnoses:  Strep throat    Rx / DC Orders ED Discharge Orders          Ordered    amoxicillin (AMOXIL) 500 MG capsule  2 times daily        06/04/23 2016              Glyn Ade, MD 06/04/23 2016

## 2023-06-04 NOTE — ED Triage Notes (Signed)
Pt c/o sore throat x 3d 

## 2023-06-15 ENCOUNTER — Encounter: Payer: Self-pay | Admitting: Student

## 2023-06-15 ENCOUNTER — Ambulatory Visit: Payer: Medicaid Other | Admitting: Student

## 2023-06-15 VITALS — BP 111/72 | HR 68 | Ht 60.0 in | Wt 212.4 lb

## 2023-06-15 DIAGNOSIS — Z30011 Encounter for initial prescription of contraceptive pills: Secondary | ICD-10-CM

## 2023-06-15 DIAGNOSIS — Z3041 Encounter for surveillance of contraceptive pills: Secondary | ICD-10-CM

## 2023-06-15 DIAGNOSIS — R21 Rash and other nonspecific skin eruption: Secondary | ICD-10-CM

## 2023-06-15 MED ORDER — NORETHIN ACE-ETH ESTRAD-FE 1-20 MG-MCG(24) PO TABS: 1.0000 | ORAL_TABLET | Freq: Every day | ORAL | 4 refills | Status: DC

## 2023-06-15 NOTE — Progress Notes (Signed)
  History:  [name] is a 37 y.o. [phone] who presents to clinic today for questions on restarting birth control pill. Patient states she stopped taking prescribed birth control pill after the first month due to bloating and nausea and lighter bleeding. Since stopping the pill she has noticed heavier cycles that sometimes present twice a month. Is interested in starting contraceptive management today. Patient is not sexually active.  The following portions of the patient's history were reviewed and updated as appropriate: allergies, current medications, family history, past medical history, social history, past surgical history and problem list.  Patient states that she has not had elevated blood pressures or Crohn's flare since pregnancy.  Review of Systems:  Review of Systems  Skin:  Positive for rash.       Palms of hands  All other systems reviewed and are negative.     Objective:  Physical Exam BP 111/72   Pulse 68   Ht 5' (1.524 m)   Wt 212 lb 6.4 oz (96.3 kg)   LMP 06/04/2023   BMI 41.48 kg/m  Physical Exam Constitutional:      Appearance: Normal appearance.  Cardiovascular:     Rate and Rhythm: Normal rate.  Pulmonary:     Effort: Pulmonary effort is normal.  Skin:    General: Skin is warm.     Findings: Rash present.  Neurological:     Mental Status: She is alert and oriented to person, place, and time. Mental status is at baseline.  Psychiatric:        Mood and Affect: Mood normal.        Behavior: Behavior normal.        Thought Content: Thought content normal.        Judgment: Judgment normal.       Labs and Imaging No results found for this or any previous visit (from the past 24 hour(s)).  No results found.  Health Maintenance Due  Topic Date Due   COVID-19 Vaccine (1 - 2023-24 season) Never done    Labs, imaging and previous visits in Epic and Care Everywhere reviewed  Assessment & Plan:  1. Encounter for BCP (birth control pills)  initial prescription 2. Family planning, BCP (birth control pills) maintenance - Patient is eligible for quick start method - Discussed normal side effects of first 2-3 months of COC. Encouraged to take pill on full stomach or at night to decrease symptoms. Bleeding should become more regular and light after 3 months of use. Should patient have adverse effects or response to medication, encouraged to follow-up in-person or via virtual visit. - Norethindrone Acetate-Ethinyl Estrad-FE (LOESTRIN 24 FE) 1-20 MG-MCG(24) tablet; Take 1 tablet by mouth daily.  Dispense: 84 tablet; Refill: 4  3. Rash of hand - Ambulatory referral to [facility]  Approximately 15 minutes of total time was spent with this patient on counseling and coordination of care  No follow-ups on file.  Corlis Hove, NP 06/15/2023 1:25 PM

## 2023-06-15 NOTE — Progress Notes (Signed)
Pt presents for [facility] consult. Pt stopped BC pills 2 months ago. Pt reports periods twice a month since stopping BC.

## 2023-07-04 ENCOUNTER — Emergency Department (HOSPITAL_BASED_OUTPATIENT_CLINIC_OR_DEPARTMENT_OTHER)
Admission: EM | Admit: 2023-07-04 | Discharge: 2023-07-04 | Disposition: A | Discharge status: Home / Self Care | Payer: Medicaid Other | Attending: Emergency Medicine | Admitting: Emergency Medicine

## 2023-07-04 ENCOUNTER — Other Ambulatory Visit: Payer: Self-pay

## 2023-07-04 ENCOUNTER — Encounter (HOSPITAL_BASED_OUTPATIENT_CLINIC_OR_DEPARTMENT_OTHER): Payer: Self-pay

## 2023-07-04 DIAGNOSIS — J029 Acute pharyngitis, unspecified: Secondary | ICD-10-CM | POA: Diagnosis present

## 2023-07-04 DIAGNOSIS — Z1152 Encounter for screening for COVID-19: Secondary | ICD-10-CM | POA: Insufficient documentation

## 2023-07-04 DIAGNOSIS — J020 Streptococcal pharyngitis: Secondary | ICD-10-CM | POA: Insufficient documentation

## 2023-07-04 LAB — RESP PANEL BY RT-PCR (RSV, FLU A&B, COVID)  RVPGX2
Influenza A by PCR: NEGATIVE
Influenza B by PCR: NEGATIVE
Resp Syncytial Virus by PCR: NEGATIVE
SARS Coronavirus 2 by RT PCR: NEGATIVE

## 2023-07-04 LAB — GROUP A STREP BY PCR: Group A Strep by PCR: DETECTED — AB

## 2023-07-04 MED ORDER — PENICILLIN G BENZATHINE 1200000 UNIT/2ML IM SUSY
1.2000 10*6.[IU] | PREFILLED_SYRINGE | Freq: Once | INTRAMUSCULAR | Status: AC
  Administered 2023-07-04: 1.2 10*6.[IU] via INTRAMUSCULAR
  Filled 2023-07-04: qty 2

## 2023-07-04 NOTE — Discharge Instructions (Addendum)
Continue Tylenol and ibuprofen for pain.  Please return if symptoms worsen.  You have been treated with a long-acting antibiotic for strep throat.

## 2023-07-04 NOTE — ED Provider Notes (Signed)
[place] [facility] Provider Note   CSN: [id number] Arrival date & time: 07/04/23  1957     History  Chief Complaint  Patient presents with   Sore Throat    Frances Mccormick is a 37 y.o. female.  Patient here with sore throat.  No significant medical problems otherwise.  Nothing makes it worse or better.  She is noticed white spots in her throat.  Denies any chest pain or shortness of breath or weakness or numbness or trouble swallowing or drooling.  The history is provided by the patient.       Home Medications Prior to Admission medications   Medication Sig Start Date End Date Taking? Authorizing Provider  Norethindrone Acetate-Ethinyl Estrad-FE (LOESTRIN 24 FE) 1-20 MG-MCG(24) tablet Take 1 tablet by mouth daily. 06/15/23   Corlis Hove, NP      Allergies    Bee venom and Ciprofloxacin    Review of Systems   Review of Systems  Physical Exam Updated Vital Signs BP 126/74 (BP Location: Left Arm)   Pulse 67   Temp 98.9 F (37.2 C) (Oral)   Resp 18   Ht 5' (1.524 m)   Wt 96.2 kg   LMP 06/04/2023   SpO2 100%   BMI 41.40 kg/m  Physical Exam Vitals and nursing note reviewed.  Constitutional:      General: She is not in acute distress.    Appearance: She is well-developed.  HENT:     Head: Normocephalic and atraumatic.     Mouth/Throat:     Mouth: Mucous membranes are moist.     Pharynx: Oropharyngeal exudate and posterior oropharyngeal erythema present.     Tonsils: Tonsillar exudate present. No tonsillar abscesses.  Eyes:     Conjunctiva/sclera: Conjunctivae normal.  Cardiovascular:     Rate and Rhythm: Normal rate and regular rhythm.     Heart sounds: No murmur heard. Pulmonary:     Effort: Pulmonary effort is normal. No respiratory distress.     Breath sounds: Normal breath sounds.  Abdominal:     Palpations: Abdomen is soft.     Tenderness: There is no abdominal tenderness.  Musculoskeletal:        General:  No swelling.     Cervical back: Neck supple.  Skin:    General: Skin is warm and dry.     Capillary Refill: Capillary refill takes less than 2 seconds.  Neurological:     Mental Status: She is alert.  Psychiatric:        Mood and Affect: Mood normal.     ED Results / Procedures / Treatments   Labs (all labs ordered are listed, but only abnormal results are displayed) Labs Reviewed  GROUP A STREP BY PCR - Abnormal; Notable for the following components:      Result Value   Group A Strep by PCR DETECTED (*)    All other components within normal limits  RESP PANEL BY RT-PCR (RSV, FLU A&B, COVID)  [id number]    EKG None  Radiology No results found.  Procedures Procedures    Medications Ordered in ED Medications  penicillin g benzathine (BICILLIN LA) 1200000 UNIT/2ML injection 1.2 Million Units (has no administration in time range)    ED Course/ Medical Decision Making/ A&P                                 Medical Decision  Making Risk Prescription drug management.   Frances Mccormick is here with sore throat.  Looks like a pharyngitis on exam.  Strep test was done in triage which is positive.  She has no trismus or drooling.  No major submandibular swelling.  Will treat with penicillin.  Discharged in good condition.  Understands return precautions.  This chart was dictated using voice recognition software.  Despite best efforts to proofread,  errors can occur which can change the documentation meaning.         Final Clinical Impression(s) / ED Diagnoses Final diagnoses:  Strep throat    Rx / DC Orders ED Discharge Orders     None         Virgina Norfolk, DO 07/04/23 2144

## 2023-07-04 NOTE — ED Triage Notes (Signed)
The patient is having sore throat and stated there are white patches on her tonsils. She is also coughing. No fever.

## 2023-09-22 ENCOUNTER — Encounter (HOSPITAL_BASED_OUTPATIENT_CLINIC_OR_DEPARTMENT_OTHER): Payer: Self-pay | Admitting: Pediatrics

## 2023-09-22 ENCOUNTER — Other Ambulatory Visit: Payer: Self-pay

## 2023-09-22 ENCOUNTER — Emergency Department (HOSPITAL_BASED_OUTPATIENT_CLINIC_OR_DEPARTMENT_OTHER)
Admission: EM | Admit: 2023-09-22 | Discharge: 2023-09-22 | Disposition: A | Discharge status: Home / Self Care | Payer: Medicaid Other | Attending: Emergency Medicine | Admitting: Emergency Medicine

## 2023-09-22 ENCOUNTER — Emergency Department (HOSPITAL_BASED_OUTPATIENT_CLINIC_OR_DEPARTMENT_OTHER): Payer: Medicaid Other

## 2023-09-22 DIAGNOSIS — I10 Essential (primary) hypertension: Secondary | ICD-10-CM | POA: Diagnosis not present

## 2023-09-22 DIAGNOSIS — Z79899 Other long term (current) drug therapy: Secondary | ICD-10-CM | POA: Diagnosis not present

## 2023-09-22 DIAGNOSIS — M25562 Pain in left knee: Secondary | ICD-10-CM | POA: Diagnosis not present

## 2023-09-22 NOTE — Discharge Instructions (Addendum)
Please follow-up with your primary care provider in regards to recent ER visit. Today physical exam and imaging was reassuring and you most likely have a benign process going on.  However we are still waiting on radiology to read the x-ray and if there are any abnormalities I will call you. You may rest, ice, compress, elevate your extremity and use Tylenol every 6 hours as needed for pain. If your knee continues to bother you you may call the orthopedist have attached here for you. If you begin to have decreased sensation, skin color changes, pain out of proportion/not controlled by over-the-counter medications, rigid compartments, or other worsening of symptoms please return to ER.

## 2023-09-22 NOTE — ED Provider Notes (Signed)
[place] [facility] Provider Note   CSN: [id number] Arrival date & time: 09/22/23  1137     History  Chief Complaint  Patient presents with   Knee Pain    Frances Mccormick is a 37 y.o. female history of Crohn's, hypertension presented with left knee pain for the past 2 weeks.  Patient states that she had a mechanical fall when she tripped on the sidewalk and landed on her left knee.  Patient failed to walk since then but states that hurts to bear weight.  Patient states that her knee did swell at 1 point and she had tenderness behind the knee however no calf tenderness and that this swelling and tenderness did ultimately resolve.  Patient been taking ibuprofen twice daily which seems to help.  Patient has tried heat packs no relief.  Patient states she came in as she was unsure whether or not she should still be having knee pain 2 weeks after.  Patient does note that she is on her feet a lot as well at work and has not been able to rest.  Patient denied fevers, redness to the area, discharge, change in sensation/motor skills, new onset weakness, inability to move knee or bear weight  Home Medications Prior to Admission medications   Medication Sig Start Date End Date Taking? Authorizing Provider  Norethindrone Acetate-Ethinyl Estrad-FE (LOESTRIN 24 FE) 1-20 MG-MCG(24) tablet Take 1 tablet by mouth daily. 06/15/23   Frances Hove, NP      Allergies    Bee venom and Ciprofloxacin    Review of Systems   Review of Systems  Physical Exam Updated Vital Signs BP (!) 132/90 (BP Location: Left Arm)   Pulse 63   Temp 98.7 F (37.1 C) (Oral)   Resp 18   Ht 5' (1.524 m)   Wt 95.3 kg   LMP 09/16/2023 (Approximate)   SpO2 100%   BMI 41.01 kg/m  Physical Exam Vitals reviewed.  Constitutional:      General: She is not in acute distress. Cardiovascular:     Rate and Rhythm: Normal rate.     Pulses: Normal pulses.  Musculoskeletal:      Comments: Left knee: Negative varus/valgus stress test, negative anterior/posterior stress test, no bony tenderness, able to palpate intact quadricep and patellar tendon, able to bear weight, no swelling or warmth noted on exam or skin color changes, unable to palpate a Baker's cyst, no calf tenderness Pain not out of proportion Soft compartments  Skin:    General: Skin is warm and dry.     Capillary Refill: Capillary refill takes less than 2 seconds.  Neurological:     Mental Status: She is alert.     Comments: Sensation intact distally  Psychiatric:        Mood and Affect: Mood normal.     ED Results / Procedures / Treatments   Labs (all labs ordered are listed, but only abnormal results are displayed) Labs Reviewed - No data to display  EKG None  Radiology No results found.  Procedures Procedures    Medications Ordered in ED Medications - No data to display  ED Course/ Medical Decision Making/ A&P                                 Medical Decision Making Amount and/or Complexity of Data Reviewed Radiology: ordered.   Frances Mccormick 37 y.o. presented  today for left knee pain. Working DDx that I considered at this time includes, but not limited to, contusion, strain/sprain, fracture, dislocation, neurovascular compromise, septic joint, ischemic limb, compartment syndrome.  R/o DDx: strain/sprain, fracture, dislocation, neurovascular compromise, septic joint, ischemic limb, compartment syndrome: These are considered less likely due to history of present illness, physical exam, labs/imaging findings.  Review of prior external notes: 07/04/2023 ED  Unique Tests and My Interpretation:  Left knee x-ray: No acute changes  Social Determinants of Health: none  Discussion with Independent Historian: None  Discussion of Management of Tests: None  Risk: Low: based on diagnostic testing/clinical impression and treatment plan  Risk Stratification Score: none  Plan: On  exam patient was in no acute distress stable vitals.  Patient's physical exam was unremarkable as patient was neuro vastly intact and did not have any red flag signs on exam.  Patient that she fell forward from mechanical fall and landed on her left knee which is not suspicious for any ligamental injury.  Patient has not been able to use the knee brace at home so we will give 1 here along with ice as we await x-ray.  My independent interpretation of the x-ray does not show any acute fractures however still waiting for radiology read.  Anticipate discharge with primary care follow-up however will also give orthopedic follow-up for any keeps bothering her.  At this time I have very low suspicion of any life-threatening diagnoses.  Patient states she does not need a work note at this time.  Patient states that she wants to leave before the x-ray is read.  I spoke to the patient about I will call if there are any abnormalities in that she can follow-up with her primary care provider in regards to recent visit and ER symptoms however if her knee keeps bothering her to call the orthopedist.  At this time I do not see any osseous or other abnormalities on the x-ray and feel the patient is most likely bruised her knee.  Encouraged patient to continue seeing her ibuprofen and Tylenol at home and to ice and rest over the next few days and use the knee brace along with elevating her leg.  Patient was given return precautions. Patient stable for discharge at this time.  Patient verbalized understanding of plan.  This chart was dictated using voice recognition software.  Despite best efforts to proofread,  errors can occur which can change the documentation meaning.         Final Clinical Impression(s) / ED Diagnoses Final diagnoses:  Acute pain of left knee    Rx / DC Orders ED Discharge Orders     None         Remi Deter 09/22/23 1335    Melene Plan, DO 09/22/23 1421

## 2023-09-22 NOTE — ED Triage Notes (Signed)
C/O left sided knee pain and swelling; stated had a fall a week ago and landed on it.

## 2023-10-24 ENCOUNTER — Emergency Department (HOSPITAL_COMMUNITY): Payer: Medicaid Other

## 2023-10-24 ENCOUNTER — Encounter (HOSPITAL_COMMUNITY): Payer: Self-pay

## 2023-10-24 ENCOUNTER — Other Ambulatory Visit: Payer: Self-pay

## 2023-10-24 ENCOUNTER — Observation Stay (HOSPITAL_COMMUNITY)
Admission: EM | Admit: 2023-10-24 | Discharge: 2023-10-26 | Disposition: A | Discharge status: Home / Self Care | Payer: Medicaid Other | Attending: Family Medicine | Admitting: Family Medicine

## 2023-10-24 DIAGNOSIS — K50919 Crohn's disease, unspecified, with unspecified complications: Secondary | ICD-10-CM | POA: Insufficient documentation

## 2023-10-24 DIAGNOSIS — Z6841 Body Mass Index (BMI) 40.0 and over, adult: Secondary | ICD-10-CM | POA: Diagnosis not present

## 2023-10-24 DIAGNOSIS — I10 Essential (primary) hypertension: Secondary | ICD-10-CM | POA: Diagnosis not present

## 2023-10-24 DIAGNOSIS — R1084 Generalized abdominal pain: Secondary | ICD-10-CM | POA: Diagnosis not present

## 2023-10-24 DIAGNOSIS — E876 Hypokalemia: Secondary | ICD-10-CM | POA: Diagnosis not present

## 2023-10-24 DIAGNOSIS — E871 Hypo-osmolality and hyponatremia: Secondary | ICD-10-CM | POA: Diagnosis not present

## 2023-10-24 DIAGNOSIS — K5000 Crohn's disease of small intestine without complications: Secondary | ICD-10-CM

## 2023-10-24 DIAGNOSIS — I160 Hypertensive urgency: Secondary | ICD-10-CM

## 2023-10-24 DIAGNOSIS — Z7901 Long term (current) use of anticoagulants: Secondary | ICD-10-CM | POA: Diagnosis not present

## 2023-10-24 DIAGNOSIS — K5090 Crohn's disease, unspecified, without complications: Secondary | ICD-10-CM | POA: Diagnosis present

## 2023-10-24 DIAGNOSIS — R109 Unspecified abdominal pain: Principal | ICD-10-CM

## 2023-10-24 DIAGNOSIS — Z79899 Other long term (current) drug therapy: Secondary | ICD-10-CM | POA: Diagnosis not present

## 2023-10-24 DIAGNOSIS — R112 Nausea with vomiting, unspecified: Secondary | ICD-10-CM

## 2023-10-24 DIAGNOSIS — E669 Obesity, unspecified: Secondary | ICD-10-CM

## 2023-10-24 LAB — COMPREHENSIVE METABOLIC PANEL
ALT: 17 U/L (ref 0–44)
AST: 23 U/L (ref 15–41)
Albumin: 4.3 g/dL (ref 3.5–5.0)
Alkaline Phosphatase: 79 U/L (ref 38–126)
Anion gap: 11 (ref 5–15)
BUN: 7 mg/dL (ref 6–20)
CO2: 23 mmol/L (ref 22–32)
Calcium: 8.9 mg/dL (ref 8.9–10.3)
Chloride: 100 mmol/L (ref 98–111)
Creatinine, Ser: 0.78 mg/dL (ref 0.44–1.00)
GFR, Estimated: >60 mL/min (ref >60)
Glucose, Bld: 120 mg/dL — ABNORMAL HIGH (ref 70–99)
Potassium: 3.5 mmol/L (ref 3.5–5.1)
Sodium: 134 mmol/L — ABNORMAL LOW (ref 135–145)
Total Bilirubin: 0.4 mg/dL (ref <1.2)
Total Protein: 9 g/dL — ABNORMAL HIGH (ref 6.5–8.1)

## 2023-10-24 LAB — URINALYSIS, ROUTINE W REFLEX MICROSCOPIC
Bacteria, UA: NONE SEEN
Bilirubin Urine: NEGATIVE
Glucose, UA: NEGATIVE mg/dL
Ketones, ur: 20 mg/dL — AB
Leukocytes,Ua: NEGATIVE
Nitrite: NEGATIVE
Protein, ur: NEGATIVE mg/dL
Specific Gravity, Urine: 1.026 (ref 1.005–1.030)
pH: 7 (ref 5.0–8.0)

## 2023-10-24 LAB — CBC WITH DIFFERENTIAL/PLATELET
Abs Immature Granulocytes: 0.07 10*3/uL (ref 0.00–0.07)
Basophils Absolute: 0.1 10*3/uL (ref 0.0–0.1)
Basophils Relative: 0 %
Eosinophils Absolute: 0 10*3/uL (ref 0.0–0.5)
Eosinophils Relative: 0 %
HCT: 50.2 % — ABNORMAL HIGH (ref 36.0–46.0)
Hemoglobin: 15.9 g/dL — ABNORMAL HIGH (ref 12.0–15.0)
Immature Granulocytes: 0 %
Lymphocytes Relative: 6 %
Lymphs Abs: 1.1 10*3/uL (ref 0.7–4.0)
MCH: 25.6 pg — ABNORMAL LOW (ref 26.0–34.0)
MCHC: 31.7 g/dL (ref 30.0–36.0)
MCV: 81 fL (ref 80.0–100.0)
Monocytes Absolute: 0.3 10*3/uL (ref 0.1–1.0)
Monocytes Relative: 2 %
Neutro Abs: 17.6 10*3/uL — ABNORMAL HIGH (ref 1.7–7.7)
Neutrophils Relative %: 92 %
Platelets: 409 10*3/uL — ABNORMAL HIGH (ref 150–400)
RBC: 6.2 MIL/uL — ABNORMAL HIGH (ref 3.87–5.11)
RDW: 17.1 % — ABNORMAL HIGH (ref 11.5–15.5)
WBC: 19.1 10*3/uL — ABNORMAL HIGH (ref 4.0–10.5)
nRBC: 0 % (ref 0.0–0.2)

## 2023-10-24 LAB — HCG, SERUM, QUALITATIVE: Preg, Serum: NEGATIVE

## 2023-10-24 LAB — LIPASE, BLOOD: Lipase: 27 U/L (ref 11–51)

## 2023-10-24 MED ORDER — ONDANSETRON 4 MG PO TBDP: 4.0000 mg | ORAL_TABLET | Freq: Three times a day (TID) | ORAL | 0 refills | Status: DC | PRN

## 2023-10-24 MED ORDER — MORPHINE SULFATE (PF) 4 MG/ML IV SOLN
4.0000 mg | Freq: Once | INTRAVENOUS | Status: AC
  Administered 2023-10-24: 4 mg via INTRAVENOUS
  Filled 2023-10-24: qty 1

## 2023-10-24 MED ORDER — PREDNISONE 20 MG PO TABS: 40.0000 mg | ORAL_TABLET | Freq: Every day | ORAL | 0 refills | Status: DC

## 2023-10-24 MED ORDER — SODIUM CHLORIDE 0.9 % IV BOLUS
1000.0000 mL | Freq: Once | INTRAVENOUS | Status: AC
  Administered 2023-10-24: 1000 mL via INTRAVENOUS

## 2023-10-24 MED ORDER — IOHEXOL 300 MG/ML  SOLN
100.0000 mL | Freq: Once | INTRAMUSCULAR | Status: AC | PRN
  Administered 2023-10-24: 100 mL via INTRAVENOUS

## 2023-10-24 MED ORDER — HYDRALAZINE HCL 20 MG/ML IJ SOLN
10.0000 mg | Freq: Once | INTRAMUSCULAR | Status: DC
  Filled 2023-10-24: qty 1

## 2023-10-24 MED ORDER — METOCLOPRAMIDE HCL 5 MG/ML IJ SOLN
10.0000 mg | Freq: Once | INTRAMUSCULAR | Status: AC
  Administered 2023-10-24: 10 mg via INTRAVENOUS
  Filled 2023-10-24: qty 2

## 2023-10-24 MED ORDER — MORPHINE SULFATE (PF) 4 MG/ML IV SOLN
4.0000 mg | Freq: Once | INTRAVENOUS | Status: AC
Start: 2023-10-24 — End: 2023-10-24
  Administered 2023-10-24: 4 mg via INTRAVENOUS
  Filled 2023-10-24: qty 1

## 2023-10-24 MED ORDER — ONDANSETRON HCL 4 MG/2ML IJ SOLN
4.0000 mg | Freq: Once | INTRAMUSCULAR | Status: AC
  Administered 2023-10-24: 4 mg via INTRAVENOUS
  Filled 2023-10-24: qty 2

## 2023-10-24 NOTE — ED Triage Notes (Signed)
Started this AM with N/V has a hx of chron's dx and believes this is a flare up.

## 2023-10-24 NOTE — ED Provider Notes (Signed)
Patient is a handoff from Barrett, PA-C.  Plan at this time is to wait for full workup and imaging to result.  P.o. challenge pending if patient can tolerate can discharge home with outpatient management of Crohn's flareup.  Hydralazine ordered for hypertension although not given at this time. Physical Exam  BP (!) 194/94   Pulse 78   Temp 99.4 F (37.4 C) (Oral)   Resp (!) 22   Ht 5' (1.524 m)   Wt 95.3 kg   LMP  (LMP Unknown)   SpO2 100%   BMI 41.03 kg/m   Physical Exam Vitals and nursing note reviewed.  Constitutional:      General: She is not in acute distress.    Appearance: She is ill-appearing. She is not toxic-appearing.  HENT:     Head: Normocephalic and atraumatic.  Eyes:     General: No scleral icterus.    Conjunctiva/sclera: Conjunctivae normal.  Cardiovascular:     Rate and Rhythm: Normal rate and regular rhythm.     Pulses: Normal pulses.     Heart sounds: Normal heart sounds.  Pulmonary:     Effort: Pulmonary effort is normal. No respiratory distress.     Breath sounds: Normal breath sounds.  Abdominal:     General: Abdomen is flat. Bowel sounds are normal. There is no distension.     Palpations: Abdomen is soft. There is no mass.     Tenderness: There is abdominal tenderness. There is no right CVA tenderness or left CVA tenderness.     Comments: Epigastric   Musculoskeletal:     Right lower leg: No edema.     Left lower leg: No edema.  Skin:    General: Skin is warm and dry.     Findings: No lesion.  Neurological:     General: No focal deficit present.     Mental Status: She is alert and oriented to person, place, and time. Mental status is at baseline.     Procedures  Procedures  ED Course / MDM   Clinical Course as of 10/24/23 2339  Sat Oct 24, 2023  2007 Hydralazine for HTN. If BP improving and patient tolerating PO and rest of workup is fine, can likely discharge home. [OZ]    Clinical Course User Index [OZ] Smitty Knudsen, PA-C    Medical Decision Making Amount and/or Complexity of Data Reviewed Labs: ordered. Radiology: ordered.  Risk Prescription drug management. Decision regarding hospitalization.   Patient is a handoff from Barrett, PA-C.  Please see her note for full HPI and physical exam findings.  Plan at time of handoff is to await for full workup results returned and then p.o. challenge.  Patient can tolerate p.o. without difficulty plan for discharge home.  Patient is notably hypertensive and hydralazine is an order for hypertensive management.   Patient's labs and imaging are thankfully reassuring.  Appears that patient is having an acute flareup of her Crohn's.  On attempted p.o. challenge, patient has had some worsening in nausea and vomiting.  Patient also notably became diaphoretic and normotensive which is not close to patient's baseline.  Given decline in symptoms, will plan for admission with hospitalist for intractable nausea and vomiting as well as unclear cause of patient's hypertension.  Hydralazine has not been given as patient became normotensive and there is no acute indication for administration.  Have his medications still ordered if needed for hypertensive findings.  Spoke with Dr. Cyndia Bent, hospitalist, who will be admitting patient.  Smitty Knudsen, PA-C 10/24/23 1610    Pricilla Loveless, MD 10/25/23 1525

## 2023-10-24 NOTE — ED Provider Notes (Addendum)
[place] EMERGENC[facility] DEPARTMENT AT [facility] Provider Note   CSN: [id number] Arrival date & time: 10/24/23  1404     History  Chief Complaint  Patient presents with   Nausea   Emesis    Frances Mccormick is a 37 y.o. female with past medical history of crohn's, hypertension, noncompliance reporting to emergency room with 1 day of epigastric pain, nausea vomiting and diarrhea.  Patient reports that this started a few hours after eating salmon which she suspects was undercooked but thinks this is related to Crohns flare up.  Patient reports she has not had Crohn's disease flareup in approximately 2 years and has not been taking any medication to prevent recurrent flareups.  Patient reports she is established with GI doctor who is monitoring Crohn's disease.  Patient reports that she does not take any home medications.  Not tolerating p.o. intake, reports she is feeling generally weak since this started. Denies any fever, chills, chest pain, shortness of breath.  Denies blood in the stool or in her urine.  Patient reports she has not had recent colonoscopy.     Emesis      Home Medications Prior to Admission medications   Medication Sig Start Date End Date Taking? Authorizing Provider  Norethindrone Acetate-Ethinyl Estrad-FE (LOESTRIN 24 FE) 1-20 MG-MCG(24) tablet Take 1 tablet by mouth daily. 06/15/23   Corlis Hove, NP      Allergies    Bee venom and Ciprofloxacin    Review of Systems   Review of Systems  Gastrointestinal:  Positive for vomiting.    Physical Exam Updated Vital Signs BP (!) 184/100 (BP Location: Right Arm)   Pulse (!) 54   Temp 99 F (37.2 C) (Oral)   Resp 18   Ht 5' (1.524 m)   Wt 95.3 kg   LMP  (LMP Unknown)   SpO2 100%   BMI 41.03 kg/m  Physical Exam Vitals and nursing note reviewed.  Constitutional:      General: She is not in acute distress.    Appearance: She is ill-appearing. She is not toxic-appearing.  HENT:     Head:  Normocephalic and atraumatic.  Eyes:     General: No scleral icterus.    Conjunctiva/sclera: Conjunctivae normal.  Cardiovascular:     Rate and Rhythm: Normal rate and regular rhythm.     Pulses: Normal pulses.     Heart sounds: Normal heart sounds.  Pulmonary:     Effort: Pulmonary effort is normal. No respiratory distress.     Breath sounds: Normal breath sounds.  Abdominal:     General: Abdomen is flat. Bowel sounds are normal. There is no distension.     Palpations: Abdomen is soft. There is no mass.     Tenderness: There is abdominal tenderness. There is no right CVA tenderness or left CVA tenderness.     Comments: Epigastric   Musculoskeletal:     Right lower leg: No edema.     Left lower leg: No edema.  Skin:    General: Skin is warm and dry.     Findings: No lesion.  Neurological:     General: No focal deficit present.     Mental Status: She is alert and oriented to person, place, and time. Mental status is at baseline.     ED Results / Procedures / Treatments   Labs (all labs ordered are listed, but only abnormal results are displayed) Labs Reviewed  COMPREHENSIVE METABOLIC PANEL - Abnormal; Notable for  the following components:      Result Value   Sodium 134 (*)    Glucose, Bld 120 (*)    Total Protein 9.0 (*)    All other components within normal limits  CBC WITH DIFFERENTIAL/PLATELET - Abnormal; Notable for the following components:   WBC 19.1 (*)    RBC 6.20 (*)    Hemoglobin 15.9 (*)    HCT 50.2 (*)    MCH 25.6 (*)    RDW 17.1 (*)    Platelets 409 (*)    Neutro Abs 17.6 (*)    All other components within normal limits  LIPASE, BLOOD  HCG, SERUM, QUALITATIVE  URINALYSIS, ROUTINE W REFLEX MICROSCOPIC    EKG EKG Interpretation Date/Time:  Saturday October 24 2023 16:20:05 EST Ventricular Rate:  73 PR Interval:  163 QRS Duration:  98 QT Interval:  432 QTC Calculation: 477 R Axis:   41  Text Interpretation: Sinus rhythm Left atrial enlargement  RSR' in V1 or V2, right VCD or RVH Nonspecific T abnormalities, anterior leads Confirmed by Coralee Pesa [id number]) on 10/24/2023 7:57:18 PM  Radiology CT ABDOMEN PELVIS W CONTRAST  Result Date: 10/24/2023 CLINICAL DATA:  Nausea, vomiting, history of Crohn's disease. EXAM: CT ABDOMEN AND PELVIS WITH CONTRAST TECHNIQUE: Multidetector CT imaging of the abdomen and pelvis was performed using the standard protocol following bolus administration of intravenous contrast. RADIATION DOSE REDUCTION: This exam was performed according to the departmental dose-optimization program which includes automated exposure control, adjustment of the mA and/or kV according to patient size and/or use of iterative reconstruction technique. CONTRAST:  OMNIPAQUE IOHEXOL 300 MG/ML  SOLN COMPARISON:  08/04/2021 FINDINGS: Lower chest: No acute abnormality. Hepatobiliary: Focal fatty infiltration along the falciform ligament. Liver is otherwise unremarkable. No biliary dilation. Unremarkable gallbladder. Pancreas: Unremarkable. No pancreatic ductal dilatation or surrounding inflammatory changes. Spleen: Unremarkable. Adrenals/Urinary Tract: Stable adrenal glands. No urinary calculi or hydronephrosis. Unremarkable bladder. Stomach/Bowel: Stomach and appendix are within normal limits. No bowel obstruction. Decompressed ileum with mild mucosal hyperenhancement similar to prior. No significant adjacent stranding. No abscess. There is fatty infiltration of the colon greatest about the right and transverse colon. No adjacent stranding or fluid. Vascular/Lymphatic: No significant vascular findings are present. No enlarged abdominal or pelvic lymph nodes. Reproductive: Uterus and bilateral adnexa are unremarkable. Other: No free intraperitoneal fluid or air. Musculoskeletal: No acute fracture. IMPRESSION: 1. Similar enteritis in the ileum compared with 08/04/2021 and compatible with history of Crohn's disease. No abscess or penetrating  disease. 2. Fatty infiltration of the colon greatest about the right and transverse colon consistent with sequela of chronic inflammation. No evidence of acute colitis. Electronically Signed   By: Minerva Fester M.D.   On: 10/24/2023 19:46    Procedures Procedures    Medications Ordered in ED Medications - No data to display  ED Course/ Medical Decision Making/ A&P Clinical Course as of 10/24/23 2008  Sat Oct 24, 2023  2007 Hydralazine for HTN. If BP improving and patient tolerating PO and rest of workup is fine, can likely discharge home. [OZ]    Clinical Course User Index [OZ] Smitty Knudsen, PA-C                                 Medical Decision Making Amount and/or Complexity of Data Reviewed Labs: ordered. Radiology: ordered.  Risk Prescription drug management. Decision regarding hospitalization.   Frances Mccormick 37 y.o. presented  today for abd pain. Working DDx includes, but not limited to, gastroenteritis, colitis, SBO, appendicitis, cholecystitis, hepatobiliary pathology, gastritis, PUD, ACS, dissection, pancreatitis, nephrolithiasis, AAA, UTI, pyelonephritis, ruptured ectopic pregnancy, PID, ovarian  R/o DDx: These are considered less likely than current impression due to history of present illness, physical exam, labs/imaging findings.  Review of prior external notes: none  Pmhx: Untreated Crohn's, untreated hypertension  Unique Tests and My Interpretation:  CBC with differential: Leukocytosis of 19.1 with neutrophil predominance, hemoglobin 15.9, platelets 409 CMP: Sodium 134, potassium 3.5 no elevated anion gap no acute kidney injury Lipase: 27 UA: Pending at the time of signout Urine Pregnancy: Negative  EKG: Rate, rhythm, axis, intervals all examined: Sinus with left atrial enlargement  Imaging:  CT Abd/Pelvis with contrast: evaluate for structural/surgical etiology of patients' severe abdominal pain -CT of the abdomen shows enteritis in the ileum  similar to findings on CT scan 2 years ago at time of recent Crohn's flareup.  Imaging is consistent with Crohn's disease without area of obstruction, penetrating disease or abscess.  No evidence of acute colitis however fatty infiltrate likely due to chronic inflammation seen in transverse colon.  Discussed imaging details with family member and patient.   Problem List / ED Course / Critical interventions / Medication management  Patient reporting to emergency room with nausea vomiting diarrhea and abdominal pain.  Patient has history of Crohn's feels related to Crohn's flare.  Labs show significant leukocytosis, no anemia, no electrolyte abnormality CT scan shows findings consistent with chronic disease without abscess or obstruction.  Patient's symptoms have improved after pain medicine and nausea medicine.  Will p.o. test.  Patient also hypertensive throughout stay, requiring hydralizine and will observe after medication change to make sure improvement in blood pressure. Patient thinks blood pressure is only elevated secondary to pain however reports her pain has gone away and we are still getting elevated blood pressure readings.  Discussed finding with patient and will have her follow-up closely with primary care for blood pressure recheck.  Patient was signed off to oncoming ED provider discharge is pending response to hydralazine with more appropriate blood pressure reading, urinalysis and p.o. challenge.  All labs and imaging discussed with patient and family.    I ordered medication including morphine, Reglan for pain, NV   Reevaluation of the patient after these medicines showed that the patient improved Patients vitals assessed. Upon arrival patient is hemodynamically stable.  I have reviewed the patients home medicines and have made adjustments as needed   Consult: none  Plan: Signed off to oncoming PA  Making sure blood pressure comes down after receiving 1 dose of hydralazine, close  follow-up with primary care for blood pressure recheck, if patient is stable for d/c will send with steroids and antinausea medication  Awaiting UA, blood pressure recheck, and p.o. challenge. If all goes well I anticipate stable for d/c, if BP is not stable or not tolerating PO intake recommending admit.           Final Clinical Impression(s) / ED Diagnoses Final diagnoses:  Hypertension, unspecified type  Crohn's disease with complication, unspecified gastrointestinal tract location [facility])    Rx / DC Orders ED Discharge Orders     None         Taylin Mans, Horald Chestnut, PA-C 10/24/23 2112    Rozelle Logan, DO 10/24/23 2214    Smitty Knudsen, PA-C 10/24/23 2229    Horton, Clabe Seal, DO 10/24/23 2313

## 2023-10-24 NOTE — H&P (Signed)
History and Physical    Patient: Frances Mccormick:[id number] DOB: [birth year] DOA: 10/24/2023 DOS: the patient was seen and examined on 10/25/2023 PCP: Pcp, No  Patient coming from: Home  Chief Complaint:  Chief Complaint  Patient presents with   Nausea   Emesis   HPI: Frances Mccormick is a 37 y.o. female with medical history significant of Crohn's disease, morbid obesity who presents with abdominal pain.   Has nausea and vomiting this morning leading to lower abdominal pain that has now radiated up to mid and upper abdomen. Had bilious and blood tinged vomitus. Cannot even quantify the amount of times she has been vomiting since it has been frequent. No diarrhea or constipation. Last bowel movement yesterday. No urinary frequency, urgency or dysuria. No NSAIDS or recent alcohol use. No sick contact.   She has hx of Crohn's but has not followed GI for close to 2 years. Not on any medications.    On arrival, she was afebrile, BP of 184/100, HR 80s, on room air.   CBC with leukocytosis of 19.1, hemoglobin of 15.9, hematocrit 50, thrombocytosis of 404.  CMP notable for mild hyponatremia of 134, CBG of 120, creatinine of 0.70, negative.  LFTs within normal limits.  Lipase within normal limits.  UA was negative.  CT abdomen pelvis showed similar enteritis in the ileum compared to CT imaging in 2022.  No abscess.  Chronic inflammation noted to right and transverse colon.  Patient was given IV opioids, antiemetics, multiple fluid boluses but continues to be symptomatic.  Hospitalist consulted for admission.  Review of Systems: As mentioned in the history of present illness. All other systems reviewed and are negative. Past Medical History:  Diagnosis Date   Advanced maternal age in multigravida, second trimester 04/25/2022   Chronic hypertension with superimposed preeclampsia 08/11/2018   Crohn's colitis (HCC)    Hidradenitis suppurativa    Hypertension    Hyperthyroidism  affecting pregnancy in first trimester 01/12/2022   Repeat TFTs in 2nd trimester     Migraine    "a few/year" (07/27/2018)   Non-compliant patient    Preterm delivery after induction of labor 06/20/2022   Preterm premature rupture of membranes (PPROM) with onset of labor within 24 hours of rupture in third trimester, antepartum 06/19/2022   Supervision of high risk pregnancy, antepartum 12/16/2021              Nursing Staff    Provider      Office Location     [facility]    Dating     LMP,  9 week [place]      [facility] Model    Arly.Keller ] Traditional  [ ]  Centering  [ ]  Mom-Baby Dyad                Language     English    Anatomy US     Nl 19 weeks      Flu Vaccine      04/25/2022    Genetic/Carrier Screen     NIPS:   LR female  AFP:   *Screen Negative*  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 She has never used smokeless tobacco. She reports that she does not currently use alcohol after a past usage of  about 2.0 standard drinks of alcohol per week. She reports current drug use. Drug: Marijuana.  Allergies  Allergen Reactions   Bee Venom Anaphylaxis   Ciprofloxacin Anaphylaxis    Family History  Problem Relation Age of Onset   Hypertension Mother    Diabetes Mother    Heart disease Mother    Diabetes Father    Hypertension Maternal Grandmother    Stroke Maternal Grandmother    Hypertension Maternal Grandfather    Diabetes Maternal Grandfather    Breast cancer Maternal Aunt     Prior to Admission medications   Medication Sig Start Date End Date Taking? Authorizing Provider  ondansetron (ZOFRAN-ODT) 4 MG disintegrating tablet Take 1 tablet (4 mg total) by mouth every 8 (eight) hours as needed for nausea or vomiting. 10/24/23  Yes Barrett, Horald Chestnut, PA-C  predniSONE (DELTASONE) 20 MG tablet Take 2 tablets (40 mg total) by mouth daily. 10/24/23  Yes Barrett,  Horald Chestnut, PA-C  Norethindrone Acetate-Ethinyl Estrad-FE (LOESTRIN 24 FE) 1-20 MG-MCG(24) tablet Take 1 tablet by mouth daily. 06/15/23   Corlis Hove, NP    Physical Exam: Vitals:   10/24/23 2345 10/25/23 0000 10/25/23 0015 10/25/23 0030  BP: (!) 178/126 (!) 198/123 (!) 205/123 (!) 187/145  Pulse: 88 96 83 65  Resp: 15 (!) 23 14 14   Temp:      TempSrc:      SpO2: 100% 100% 100% 100%  Weight:      Height:       Constitutional: NAD, calm, mildly ill and fatigued appearing morbidly obese female lying upright in bed. Active vomiting during evaluation.  Eyes: , lids and conjunctivae normal ENMT: Mucous membranes are moist.  Neck: normal, supple Respiratory: clear to auscultation bilaterally, no wheezing, no crackles. Normal respiratory effort. No accessory muscle use.  Cardiovascular: Regular rate and rhythm, no murmurs / rubs / gallops. No extremity edema.   Abdomen: soft, non-distended, diffusely tender but worse to RLQ and epigastric region Musculoskeletal: no clubbing / cyanosis. No joint deformity upper and lower extremities. Normal muscle tone.  Skin: no rashes, lesions, ulcers. No induration Neurologic: CN 2-12 grossly intact.  Psychiatric: Normal judgment and insight. Alert and oriented x 3. Normal mood.   Data Reviewed:  See HPI  Assessment and Plan: * Abdominal pain -mild Crohn's exacerbation and possible gastroenteritis -Crohn's enteritis not significantly changed from imaging in 2022 -will start BID IV solu-medrol 60mg  -IV PPI  -PRN Zofran or phenergan for nausea/vomiting -PRN IV opioid for pain -Full liquid diet and advance as tolerated -check UDS  Crohn's disease (HCC) - mild exacerbation -IV steroids started -Has followed Dr. [name] in the past but no follow up for close to 2 years  Obesity, Class III, BMI 40-49.9 (morbid obesity) (HCC) -BMI of 41  Hyponatremia -mild from dehydration -on IV fluids  Hypertensive urgency -suspect has chronic  hypertension now exacerbated with pain and nausea/vomiting -treat with IV hydralazine PRN with perimeters -recommend outpatient follow up      Advance Care Planning:   Code Status: Full Code   Consults: none  Family Communication: mother at bedside  Severity of Illness: The appropriate patient status for this patient is OBSERVATION. Observation status is judged to be reasonable and necessary in order to provide the required intensity of service to ensure the patient's safety. The patient's presenting symptoms, physical exam findings, and initial radiographic and laboratory data in the context of their medical condition is felt to place them at decreased risk for further clinical  deterioration. Furthermore, it is anticipated that the patient will be medically stable for discharge from the hospital within 2 midnights of admission.   Author: Anselm Jungling, DO 10/25/2023 12:48 AM  For on call review [email or web address].

## 2023-10-24 NOTE — ED Notes (Signed)
Pt given saltine crackers and ginger ale at this time for PO challenge. Pt informed to stop eating/drinking and call out if nausea/vomiting returns.

## 2023-10-24 NOTE — Discharge Instructions (Addendum)
n to ER.

## 2023-10-24 NOTE — ED Notes (Addendum)
Jari Favre PA-C updated that pt was subjectively tolerating sips of ginger ale, then after she attempted some bites of saltines began having severe pain. BP levels were normal prior to this but now significantly elevated again, and pt pale and diaphoretic at this time. Pt states "I think it is just from the pain." Provider requested to re-evaluate pt when able too.

## 2023-10-24 NOTE — ED Notes (Signed)
Pt urinated in emesis bag unable to collect specimen

## 2023-10-25 DIAGNOSIS — R1084 Generalized abdominal pain: Secondary | ICD-10-CM

## 2023-10-25 DIAGNOSIS — K5090 Crohn's disease, unspecified, without complications: Secondary | ICD-10-CM | POA: Insufficient documentation

## 2023-10-25 DIAGNOSIS — E669 Obesity, unspecified: Secondary | ICD-10-CM

## 2023-10-25 LAB — HIV ANTIBODY (ROUTINE TESTING W REFLEX): HIV Screen 4th Generation wRfx: NONREACTIVE

## 2023-10-25 LAB — BASIC METABOLIC PANEL
Anion gap: 12 (ref 5–15)
BUN: 8 mg/dL (ref 6–20)
CO2: 20 mmol/L — ABNORMAL LOW (ref 22–32)
Calcium: 8.3 mg/dL — ABNORMAL LOW (ref 8.9–10.3)
Chloride: 98 mmol/L (ref 98–111)
Creatinine, Ser: 0.79 mg/dL (ref 0.44–1.00)
GFR, Estimated: >60 mL/min (ref >60)
Glucose, Bld: 135 mg/dL — ABNORMAL HIGH (ref 70–99)
Potassium: 3.4 mmol/L — ABNORMAL LOW (ref 3.5–5.1)
Sodium: 130 mmol/L — ABNORMAL LOW (ref 135–145)

## 2023-10-25 LAB — RAPID URINE DRUG SCREEN, HOSP PERFORMED
Amphetamines: NOT DETECTED
Barbiturates: NOT DETECTED
Benzodiazepines: NOT DETECTED
Cocaine: NOT DETECTED
Opiates: POSITIVE — AB
Tetrahydrocannabinol: POSITIVE — AB

## 2023-10-25 LAB — CBC
HCT: 45.1 % (ref 36.0–46.0)
Hemoglobin: 14.8 g/dL (ref 12.0–15.0)
MCH: 26.2 pg (ref 26.0–34.0)
MCHC: 32.8 g/dL (ref 30.0–36.0)
MCV: 79.8 fL — ABNORMAL LOW (ref 80.0–100.0)
Platelets: 364 10*3/uL (ref 150–400)
RBC: 5.65 MIL/uL — ABNORMAL HIGH (ref 3.87–5.11)
RDW: 16.1 % — ABNORMAL HIGH (ref 11.5–15.5)
WBC: 17.9 10*3/uL — ABNORMAL HIGH (ref 4.0–10.5)
nRBC: 0 % (ref 0.0–0.2)

## 2023-10-25 LAB — PROCALCITONIN: Procalcitonin: <0.1 ng/mL

## 2023-10-25 LAB — GLUCOSE, CAPILLARY: Glucose-Capillary: 124 mg/dL — ABNORMAL HIGH (ref 70–99)

## 2023-10-25 MED ORDER — POTASSIUM CHLORIDE 10 MEQ/100ML IV SOLN
10.0000 meq | INTRAVENOUS | Status: AC
  Administered 2023-10-25 (×4): 10 meq via INTRAVENOUS
  Filled 2023-10-25 (×4): qty 100

## 2023-10-25 MED ORDER — ONDANSETRON HCL 4 MG/2ML IJ SOLN
4.0000 mg | Freq: Four times a day (QID) | INTRAMUSCULAR | Status: DC | PRN
  Administered 2023-10-25 – 2023-10-26 (×4): 4 mg via INTRAVENOUS
  Filled 2023-10-25 (×5): qty 2

## 2023-10-25 MED ORDER — SODIUM CHLORIDE 0.9 % IV SOLN: INTRAVENOUS | Status: DC

## 2023-10-25 MED ORDER — MORPHINE SULFATE (PF) 2 MG/ML IV SOLN
2.0000 mg | INTRAVENOUS | Status: DC | PRN
  Administered 2023-10-25 – 2023-10-26 (×2): 2 mg via INTRAVENOUS
  Filled 2023-10-25 (×2): qty 1

## 2023-10-25 MED ORDER — ACETAMINOPHEN 325 MG PO TABS
650.0000 mg | ORAL_TABLET | Freq: Four times a day (QID) | ORAL | Status: DC | PRN
  Administered 2023-10-25: 650 mg via ORAL

## 2023-10-25 MED ORDER — HYDRALAZINE HCL 20 MG/ML IJ SOLN
5.0000 mg | Freq: Four times a day (QID) | INTRAMUSCULAR | Status: DC | PRN
  Administered 2023-10-26: 5 mg via INTRAVENOUS
  Filled 2023-10-25: qty 1

## 2023-10-25 MED ORDER — ENOXAPARIN SODIUM 40 MG/0.4ML IJ SOSY
40.0000 mg | PREFILLED_SYRINGE | INTRAMUSCULAR | Status: DC
Start: 2023-10-25 — End: 2023-10-26
  Administered 2023-10-25 – 2023-10-26 (×2): 40 mg via SUBCUTANEOUS
  Filled 2023-10-25 (×2): qty 0.4

## 2023-10-25 MED ORDER — METHYLPREDNISOLONE SODIUM SUCC 125 MG IJ SOLR
60.0000 mg | Freq: Two times a day (BID) | INTRAMUSCULAR | Status: DC
  Administered 2023-10-25 – 2023-10-26 (×4): 60 mg via INTRAVENOUS
  Filled 2023-10-25 (×4): qty 2

## 2023-10-25 MED ORDER — SODIUM CHLORIDE 0.9 % IV SOLN
12.5000 mg | Freq: Four times a day (QID) | INTRAVENOUS | Status: DC | PRN
  Administered 2023-10-25 – 2023-10-26 (×2): 12.5 mg via INTRAVENOUS
  Filled 2023-10-25 (×2): qty 12.5

## 2023-10-25 MED ORDER — HYDROMORPHONE HCL 1 MG/ML IJ SOLN
1.0000 mg | INTRAMUSCULAR | Status: DC | PRN
  Administered 2023-10-25 – 2023-10-26 (×3): 1 mg via INTRAVENOUS
  Filled 2023-10-25 (×3): qty 1

## 2023-10-25 MED ORDER — PANTOPRAZOLE SODIUM 40 MG IV SOLR
40.0000 mg | Freq: Once | INTRAVENOUS | Status: AC
  Administered 2023-10-25: 40 mg via INTRAVENOUS
  Filled 2023-10-25: qty 10

## 2023-10-25 NOTE — Assessment & Plan Note (Signed)
-  BMI of 41

## 2023-10-25 NOTE — Assessment & Plan Note (Signed)
-  mild from dehydration -on IV fluids

## 2023-10-25 NOTE — Plan of Care (Signed)
  Problem: Education: Goal: Knowledge of General Education information will improve Description: Including pain rating scale, medication(s)/side effects and non-pharmacologic comfort measures Outcome: Progressing   Problem: Clinical Measurements: Goal: Diagnostic test results will improve Outcome: Progressing   Problem: Activity: Goal: Risk for activity intolerance will decrease Outcome: Progressing   Problem: Nutrition: Goal: Adequate nutrition will be maintained Outcome: Progressing   Problem: Pain Management: Goal: General experience of comfort will improve Outcome: Progressing   Problem: Safety: Goal: Ability to remain free from injury will improve Outcome: Progressing

## 2023-10-25 NOTE — ED Notes (Signed)
ED TO INPATIENT HANDOFF REPORT  ED Nurse Name and Phone #: Valeta Harms, RN [phone]  S Name/Age/Gender Frances Mccormick 37 y.o. female Room/Bed: WA03/WA03  Code Status   Code Status: Full Code  Home/SNF/Other Home Patient oriented to: self, place, time, and situation Is this baseline? Yes   Triage Complete: Triage complete  Chief Complaint Crohn disease (HCC) [K50.90]  Triage Note Started this AM with N/V has a hx of chron's dx and believes this is a flare up.    Allergies Allergies  Allergen Reactions   Bee Venom Anaphylaxis   Ciprofloxacin Anaphylaxis    Level of Care/Admitting Diagnosis ED Disposition     ED Disposition  Admit   Condition  --   Comment  Hospital Area: [facility] [[id number]]  Level of Care: Progressive [102]  Admit to Progressive based on following criteria: GI, ENDOCRINE disease patients with GI bleeding, acute liver failure or pancreatitis, stable with diabetic ketoacidosis or thyrotoxicosis (hypothyroid) state.  May place patient in observation at [facility] or [facility] if equivalent level of care is available:: No  Covid Evaluation: Asymptomatic - no recent exposure (last 10 days) testing not required  Diagnosis: Crohn disease [facility]) [[id number]]  Admitting Physician: Anselm Jungling [[id number]]  Attending Physician: Anselm Jungling [[id number]]          B Medical/Surgery History Past Medical History:  Diagnosis Date   Advanced maternal age in multigravida, second trimester 04/25/2022   Chronic hypertension with superimposed preeclampsia 08/11/2018   Crohn's colitis (HCC)    Hidradenitis suppurativa    Hypertension    Hyperthyroidism affecting pregnancy in first trimester 01/12/2022   Repeat TFTs in 2nd trimester     Migraine    "a few/year" (07/27/2018)   Non-compliant patient    Preterm delivery after induction of labor 06/20/2022   Preterm premature rupture of membranes (PPROM) with onset of labor within 24 hours of  rupture in third trimester, antepartum 06/19/2022   Supervision of high risk pregnancy, antepartum 12/16/2021              Nursing Staff    Provider      Office Location     [facility]    Dating     LMP,  9 week [place]      [facility] Model    Arly.Keller ] Traditional  [ ]  Centering  [ ]  Mom-Baby Dyad                Language     English    Anatomy US     Nl 19 weeks      Flu Vaccine      04/25/2022    Genetic/Carrier Screen     NIPS:   LR female  AFP:   *Screen Negative*  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      TDaP Vaccine          Hgb   Past Surgical History:  Procedure Laterality Date   INDUCED ABORTION       A IV Location/Drains/Wounds Patient Lines/Drains/Airways Status     Active Line/Drains/Airways     Name Placement date Placement time Site Days   Peripheral IV 10/24/23 20 G Right Antecubital 10/24/23  1527  Antecubital  1            Intake/Output Last 24 hours No intake or output data in the 24 hours ending 10/25/23 0048  Labs/Imaging Results for orders placed or performed during the hospital encounter of 10/24/23 (from  the past 48 hour(s))  Comprehensive metabolic panel     Status: Abnormal   Collection Time: 10/24/23  3:04 PM  Result Value Ref Range   Sodium 134 (L) 135 - 145 mmol/L   Potassium 3.5 3.5 - 5.1 mmol/L   Chloride 100 98 - 111 mmol/L   CO2 23 22 - 32 mmol/L   Glucose, Bld 120 (H) 70 - 99 mg/dL    Comment: Glucose reference range applies only to samples taken after fasting for at least 8 hours.   BUN 7 6 - 20 mg/dL   Creatinine, Ser 0.86 0.44 - 1.00 mg/dL   Calcium 8.9 8.9 - 57.8 mg/dL   Total Protein 9.0 (H) 6.5 - 8.1 g/dL   Albumin 4.3 3.5 - 5.0 g/dL   AST 23 15 - 41 U/L   ALT 17 0 - 44 U/L   Alkaline Phosphatase 79 38 - 126 U/L   Total Bilirubin 0.4 <1.2 mg/dL   GFR, Estimated >46 >96 mL/min    Comment: (NOTE) Calculated using the CKD-EPI Creatinine Equation (2021)    Anion gap 11 5 - 15    Comment: Performed at [facility], [address]  [place], [place] [address]  Lipase, blood     Status: None   Collection Time: 10/24/23  3:04 PM  Result Value Ref Range   Lipase 27 11 - 51 U/L    Comment: Performed at [facility], [address], [place], [place] [address]  CBC with Diff     Status: Abnormal   Collection Time: 10/24/23  3:04 PM  Result Value Ref Range   WBC 19.1 (H) 4.0 - 10.5 K/uL   RBC 6.20 (H) 3.87 - 5.11 MIL/uL   Hemoglobin 15.9 (H) 12.0 - 15.0 g/dL   HCT 40.1 (H) 02.7 - 25.3 %   MCV 81.0 80.0 - 100.0 fL   MCH 25.6 (L) 26.0 - 34.0 pg   MCHC 31.7 30.0 - 36.0 g/dL   RDW 66.4 (H) 40.3 - 47.4 %   Platelets 409 (H) 150 - 400 K/uL   nRBC 0.0 0.0 - 0.2 %   Neutrophils Relative % 92 %   Neutro Abs 17.6 (H) 1.7 - 7.7 K/uL   Lymphocytes Relative 6 %   Lymphs Abs 1.1 0.7 - 4.0 K/uL   Monocytes Relative 2 %   Monocytes Absolute 0.3 0.1 - 1.0 K/uL   Eosinophils Relative 0 %   Eosinophils Absolute 0.0 0.0 - 0.5 K/uL   Basophils Relative 0 %   Basophils Absolute 0.1 0.0 - 0.1 K/uL   Immature Granulocytes 0 %   Abs Immature Granulocytes 0.07 0.00 - 0.07 K/uL    Comment: Performed at [facility], [address] [place], [place] [address]  hCG, serum, qualitative     Status: None   Collection Time: 10/24/23  5:30 PM  Result Value Ref Range   Preg, Serum NEGATIVE NEGATIVE    Comment:        THE SENSITIVITY OF THIS METHODOLOGY IS >10 mIU/mL. Performed at [facility], [address], [place], [place] [address]   Urinalysis, Routine w reflex microscopic -Urine, Clean Catch     Status: Abnormal   Collection Time: 10/24/23  9:19 PM  Result Value Ref Range   Color, Urine COLORLESS (A) YELLOW   APPearance CLEAR CLEAR   Specific Gravity, Urine 1.026 1.005 - 1.030   pH 7.0 5.0 - 8.0   Glucose, UA NEGATIVE  NEGATIVE mg/dL   Hgb urine dipstick SMALL (A) NEGATIVE   Bilirubin Urine NEGATIVE NEGATIVE   Ketones, ur 20 (A) NEGATIVE mg/dL   Protein, ur NEGATIVE NEGATIVE  mg/dL   Nitrite NEGATIVE NEGATIVE   Leukocytes,Ua NEGATIVE NEGATIVE   RBC / HPF 0-5 0 - 5 RBC/hpf   WBC, UA 0-5 0 - 5 WBC/hpf   Bacteria, UA NONE SEEN NONE SEEN   Squamous Epithelial / HPF 0-5 0 - 5 /HPF    Comment: Performed at [facility], [address] [place], [place] [address]   CT ABDOMEN PELVIS W CONTRAST  Result Date: 10/24/2023 CLINICAL DATA:  Nausea, vomiting, history of Crohn's disease. EXAM: CT ABDOMEN AND PELVIS WITH CONTRAST TECHNIQUE: Multidetector CT imaging of the abdomen and pelvis was performed using the standard protocol following bolus administration of intravenous contrast. RADIATION DOSE REDUCTION: This exam was performed according to the departmental dose-optimization program which includes automated exposure control, adjustment of the mA and/or kV according to patient size and/or use of iterative reconstruction technique. CONTRAST:  OMNIPAQUE IOHEXOL 300 MG/ML  SOLN COMPARISON:  08/04/2021 FINDINGS: Lower chest: No acute abnormality. Hepatobiliary: Focal fatty infiltration along the falciform ligament. Liver is otherwise unremarkable. No biliary dilation. Unremarkable gallbladder. Pancreas: Unremarkable. No pancreatic ductal dilatation or surrounding inflammatory changes. Spleen: Unremarkable. Adrenals/Urinary Tract: Stable adrenal glands. No urinary calculi or hydronephrosis. Unremarkable bladder. Stomach/Bowel: Stomach and appendix are within normal limits. No bowel obstruction. Decompressed ileum with mild mucosal hyperenhancement similar to prior. No significant adjacent stranding. No abscess. There is fatty infiltration of the colon greatest about the right and transverse colon. No adjacent stranding or fluid. Vascular/Lymphatic: No significant vascular findings are present. No enlarged abdominal or pelvic lymph nodes. Reproductive: Uterus and bilateral adnexa are unremarkable. Other: No free intraperitoneal fluid or air. Musculoskeletal: No acute  fracture. IMPRESSION: 1. Similar enteritis in the ileum compared with 08/04/2021 and compatible with history of Crohn's disease. No abscess or penetrating disease. 2. Fatty infiltration of the colon greatest about the right and transverse colon consistent with sequela of chronic inflammation. No evidence of acute colitis. Electronically Signed   By: Minerva Fester M.D.   On: 10/24/2023 19:46    Pending Labs Unresulted Labs (From admission, onward)     Start     Ordered   10/25/23 0500  CBC  Tomorrow morning,   R        10/25/23 0038   10/25/23 0500  Basic metabolic panel  Tomorrow morning,   R        10/25/23 0038   10/25/23 0038  HIV Antibody (routine testing w rflx)  (HIV Antibody (Routine testing w reflex) panel)  Once,   R        10/25/23 0038   10/25/23 0038  Rapid urine drug screen (hospital performed)  ONCE - STAT,   STAT        10/25/23 0038            Vitals/Pain Today's Vitals   10/25/23 0000 10/25/23 0015 10/25/23 0030 10/25/23 0042  BP: (!) 198/123 (!) 205/123 (!) 187/145   Pulse: 96 83 65   Resp: (!) 23 14 14    Temp:      TempSrc:      SpO2: 100% 100% 100%   Weight:      Height:      PainSc:    9     Isolation Precautions No active isolations  Medications Medications  promethazine (PHENERGAN) 12.5 mg in  sodium chloride 0.9 % 50 mL IVPB (has no administration in time range)  ondansetron (ZOFRAN) injection 4 mg (4 mg Intravenous Given 10/25/23 0042)  morphine (PF) 2 MG/ML injection 2 mg (has no administration in time range)  HYDROmorphone (DILAUDID) injection 1 mg (1 mg Intravenous Given 10/25/23 0042)  enoxaparin (LOVENOX) injection 40 mg (has no administration in time range)  0.9 %  sodium chloride infusion ( Intravenous New Bag/Given 10/25/23 0043)  hydrALAZINE (APRESOLINE) injection 5 mg (has no administration in time range)  methylPREDNISolone sodium succinate (SOLU-MEDROL) 125 mg/2 mL injection 60 mg (has no administration in time range)  pantoprazole  (PROTONIX) injection 40 mg (has no administration in time range)  sodium chloride 0.9 % bolus 1,000 mL (1,000 mLs Intravenous Bolus 10/24/23 1530)  morphine (PF) 4 MG/ML injection 4 mg (4 mg Intravenous Given 10/24/23 1532)  metoCLOPramide (REGLAN) injection 10 mg (10 mg Intravenous Given 10/24/23 1530)  iohexol (OMNIPAQUE) 300 MG/ML solution 100 mL (100 mLs Intravenous Contrast Given 10/24/23 1905)  sodium chloride 0.9 % bolus 1,000 mL (1,000 mLs Intravenous Bolus 10/24/23 1858)  morphine (PF) 4 MG/ML injection 4 mg (4 mg Intravenous Given 10/24/23 1902)  ondansetron (ZOFRAN) injection 4 mg (4 mg Intravenous Given 10/24/23 1858)    Mobility walks with person assist     Focused Assessments Cardiac Assessment Handoff:  Cardiac Rhythm: Normal sinus rhythm Lab Results  Component Value Date   CKTOTAL 78 07/31/2011   CKMB 2.3 07/31/2011   TROPONINI <0.03 01/21/2016   Lab Results  Component Value Date   DDIMER 0.52 (H) 06/22/2021   Does the Patient currently have chest pain? No    R Recommendations: See Admitting Provider Note  Report given to:   Additional Notes: Pt BP readings have been significantly elevated today, once pain and nausea are controlled, pt vital signs improve.

## 2023-10-25 NOTE — Progress Notes (Signed)
PROGRESS NOTE    Frances Mccormick  WUJ:[id number] DOB: 1986-10-03 DOA: 10/24/2023 PCP: Pcp, No   Brief Narrative:  HPI: Frances Mccormick is a 37 y.o. female with medical history significant of Crohn's disease, morbid obesity who presents with abdominal pain.    Has nausea and vomiting this morning leading to lower abdominal pain that has now radiated up to mid and upper abdomen. Had bilious and blood tinged vomitus. Cannot even quantify the amount of times she has been vomiting since it has been frequent. No diarrhea or constipation. Last bowel movement yesterday. No urinary frequency, urgency or dysuria. No NSAIDS or recent alcohol use. No sick contact.    She has hx of Crohn's but has not followed GI for close to 2 years. Not on any medications.      On arrival, she was afebrile, BP of 184/100, HR 80s, on room air.    CBC with leukocytosis of 19.1, hemoglobin of 15.9, hematocrit 50, thrombocytosis of 404.   CMP notable for mild hyponatremia of 134, CBG of 120, creatinine of 0.70, negative.  LFTs within normal limits.  Lipase within normal limits.   UA was negative.   CT abdomen pelvis showed similar enteritis in the ileum compared to CT imaging in 2022.  No abscess.  Chronic inflammation noted to right and transverse colon.   Patient was given IV opioids, antiemetics, multiple fluid boluses but continues to be symptomatic.  Hospitalist consulted for admission.  Assessment & Plan:   Principal Problem:   Abdominal pain Active Problems:   Crohn's disease (HCC)   Hypertensive urgency   Hyponatremia   Obesity, Class III, BMI 40-49.9 (morbid obesity) (HCC)  Acute exacerbation of Crohn's disease/enteritis/nausea/vomiting/abdominal pain: CT consistent with inflammation at cecum.  Continue IV Solu-Medrol.  Procalcitonin unremarkable.  She denies any abdominal pain but she has tenderness.  She is requesting to advance diet.  Will advance to soft diet and observe another  night.  Hypertensive urgency: Blood pressure as high as 201 systolic and 145 diastolic in last 24 hours but recent reading is within normal range.  No previous history of hypertension, does not appear to be on any medications.  Monitor closely with as needed hydralazine.  Class III obesity: Dietary modification and weight loss counseled.  Hypovolemic hyponatremia: Slight drop from 134 yesterday to 130 today.  Recheck sodium later today and monitor closely.  Hypokalemia: Will replenish.  Marijuana abuse: UDS positive for marijuana.  Counseling provided.  DVT prophylaxis: enoxaparin (LOVENOX) injection 40 mg Start: 10/25/23 1000   Code Status: Full Code  Family Communication: Mother present at bedside.  Plan of care discussed with patient in length and he/she verbalized understanding and agreed with it.  Status is: Observation The patient will require care spanning > 2 midnights and should be moved to inpatient because: Needs another night in the hospital.   Estimated body mass index is 41.03 kg/m as calculated from the following:   Height as of this encounter: 5' (1.524 m).   Weight as of this encounter: 95.3 kg.    Nutritional Assessment: Body mass index is 41.03 kg/m.Marland Kitchen Seen by dietician.  I agree with the assessment and plan as outlined below: Nutrition Status:        . Skin Assessment: I have examined the patient's skin and I agree with the wound assessment as performed by the wound care RN as outlined below:    Consultants:  None  Procedures:  None  Antimicrobials:  Anti-infectives (From admission, onward)  None         Subjective: Seen and examined.  Mother at the bedside.  Her pain has resolved.  No nausea or any other complaint.  Objective: Vitals:   10/25/23 0100 10/25/23 0216 10/25/23 0220 10/25/23 0605  BP: (!) 178/101 124/78  130/70  Pulse: 87 66  69  Resp: (!) 22 18  20   Temp:  97.9 F (36.6 C)  (!) 97.5 F (36.4 C)  TempSrc:  Oral  Oral   SpO2: 100% 100%  100%  Weight:   95.3 kg   Height:   5' (1.524 m)     Intake/Output Summary (Last 24 hours) at 10/25/2023 0840 Last data filed at 10/25/2023 0513 Gross per 24 hour  Intake 687.13 ml  Output 450 ml  Net 237.13 ml   Filed Weights   10/24/23 1423 10/25/23 0220  Weight: 95.3 kg 95.3 kg    Examination:  General exam: Appears calm and comfortable  Respiratory system: Clear to auscultation. Respiratory effort normal. Cardiovascular system: S1 & S2 heard, RRR. No JVD, murmurs, rubs, gallops or clicks. No pedal edema. Gastrointestinal system: Abdomen is nondistended, soft and right lower quadrant and epigastric tenderness, mild. No organomegaly or masses felt. Normal bowel sounds heard. Central nervous system: Alert and oriented. No focal neurological deficits. Extremities: Symmetric 5 x 5 power. Skin: No rashes, lesions or ulcers Psychiatry: Judgement and insight appear normal. Mood & affect appropriate.    Data Reviewed: I have personally reviewed following labs and imaging studies  CBC: Recent Labs  Lab 10/24/23 1504 10/25/23 0416  WBC 19.1* 17.9*  NEUTROABS 17.6*  --   HGB 15.9* 14.8  HCT 50.2* 45.1  MCV 81.0 79.8*  PLT 409* 364   Basic Metabolic Panel: Recent Labs  Lab 10/24/23 1504 10/25/23 0416  NA 134* 130*  K 3.5 3.4*  CL 100 98  CO2 23 20*  GLUCOSE 120* 135*  BUN 7 8  CREATININE 0.78 0.79  CALCIUM 8.9 8.3*   GFR: Estimated Creatinine Clearance: 99.4 mL/min (by C-G formula based on SCr of 0.79 mg/dL). Liver Function Tests: Recent Labs  Lab 10/24/23 1504  AST 23  ALT 17  ALKPHOS 79  BILITOT 0.4  PROT 9.0*  ALBUMIN 4.3   Recent Labs  Lab 10/24/23 1504  LIPASE 27   No results for input(s): "AMMONIA" in the last 168 hours. Coagulation Profile: No results for input(s): "INR", "PROTIME" in the last 168 hours. Cardiac Enzymes: No results for input(s): "CKTOTAL", "CKMB", "CKMBINDEX", "TROPONINI" in the last 168 hours. BNP (last  3 results) No results for input(s): "PROBNP" in the last 8760 hours. HbA1C: No results for input(s): "HGBA1C" in the last 72 hours. CBG: Recent Labs  Lab 10/25/23 0731  GLUCAP 124*   Lipid Profile: No results for input(s): "CHOL", "HDL", "LDLCALC", "TRIG", "CHOLHDL", "LDLDIRECT" in the last 72 hours. Thyroid Function Tests: No results for input(s): "TSH", "T4TOTAL", "FREET4", "T3FREE", "THYROIDAB" in the last 72 hours. Anemia Panel: No results for input(s): "VITAMINB12", "FOLATE", "FERRITIN", "TIBC", "IRON", "RETICCTPCT" in the last 72 hours. Sepsis Labs: No results for input(s): "PROCALCITON", "LATICACIDVEN" in the last 168 hours.  No results found for this or any previous visit (from the past 240 hour(s)).   Radiology Studies: CT ABDOMEN PELVIS W CONTRAST  Result Date: 10/24/2023 CLINICAL DATA:  Nausea, vomiting, history of Crohn's disease. EXAM: CT ABDOMEN AND PELVIS WITH CONTRAST TECHNIQUE: Multidetector CT imaging of the abdomen and pelvis was performed using the standard protocol following bolus administration of  intravenous contrast. RADIATION DOSE REDUCTION: This exam was performed according to the departmental dose-optimization program which includes automated exposure control, adjustment of the mA and/or kV according to patient size and/or use of iterative reconstruction technique. CONTRAST:  OMNIPAQUE IOHEXOL 300 MG/ML  SOLN COMPARISON:  08/04/2021 FINDINGS: Lower chest: No acute abnormality. Hepatobiliary: Focal fatty infiltration along the falciform ligament. Liver is otherwise unremarkable. No biliary dilation. Unremarkable gallbladder. Pancreas: Unremarkable. No pancreatic ductal dilatation or surrounding inflammatory changes. Spleen: Unremarkable. Adrenals/Urinary Tract: Stable adrenal glands. No urinary calculi or hydronephrosis. Unremarkable bladder. Stomach/Bowel: Stomach and appendix are within normal limits. No bowel obstruction. Decompressed ileum with mild  mucosal hyperenhancement similar to prior. No significant adjacent stranding. No abscess. There is fatty infiltration of the colon greatest about the right and transverse colon. No adjacent stranding or fluid. Vascular/Lymphatic: No significant vascular findings are present. No enlarged abdominal or pelvic lymph nodes. Reproductive: Uterus and bilateral adnexa are unremarkable. Other: No free intraperitoneal fluid or air. Musculoskeletal: No acute fracture. IMPRESSION: 1. Similar enteritis in the ileum compared with 08/04/2021 and compatible with history of Crohn's disease. No abscess or penetrating disease. 2. Fatty infiltration of the colon greatest about the right and transverse colon consistent with sequela of chronic inflammation. No evidence of acute colitis. Electronically Signed   By: Minerva Fester M.D.   On: 10/24/2023 19:46    Scheduled Meds:  enoxaparin (LOVENOX) injection  40 mg Subcutaneous Q24H   methylPREDNISolone (SOLU-MEDROL) injection  60 mg Intravenous Q12H   Continuous Infusions:  sodium chloride 100 mL/hr at 10/25/23 0223   potassium chloride     promethazine (PHENERGAN) injection (IM or IVPB)       LOS: 0 days   Hughie Closs, MD [facility]  10/25/2023, 8:40 AM   *Please note that this is a verbal dictation therefore any spelling or grammatical errors are due to the "Dragon Medical One" system interpretation.  Please page via Amion and do not message via secure chat for urgent patient care matters. Secure chat can be used for non urgent patient care matters.  How to contact the [facility] Attending or Consulting provider 7A - 7P or covering provider during after hours 7P -7A, for this patient?  Check the care team in [facility] and look for a) attending/consulting TRH provider listed and b) the [facility] team listed. Page or secure chat 7A-7P. Log into [email or web address] and use [facility] universal password to access. If you do not have the password, please contact the hospital  operator. Locate the [facility] provider you are looking for under [facility] and page to a number that you can be directly reached. If you still have difficulty reaching the provider, please page the [facility] (Director on Call) for the Hospitalists listed on amion for assistance.

## 2023-10-25 NOTE — Assessment & Plan Note (Addendum)
-  mild Crohn's exacerbation and possible gastroenteritis -Crohn's enteritis not significantly changed from imaging in 2022 -will start BID IV solu-medrol 60mg  -IV PPI  -PRN Zofran or phenergan for nausea/vomiting -PRN IV opioid for pain -Full liquid diet and advance as tolerated -check UDS

## 2023-10-25 NOTE — Assessment & Plan Note (Signed)
-  suspect has chronic hypertension now exacerbated with pain and nausea/vomiting -treat with IV hydralazine PRN with perimeters -recommend outpatient follow up

## 2023-10-25 NOTE — Assessment & Plan Note (Signed)
-   mild exacerbation -IV steroids started -Has followed Dr. [name] GI in the past but no follow up for close to 2 years

## 2023-10-26 ENCOUNTER — Other Ambulatory Visit (HOSPITAL_COMMUNITY): Payer: Self-pay

## 2023-10-26 DIAGNOSIS — R1084 Generalized abdominal pain: Secondary | ICD-10-CM | POA: Diagnosis not present

## 2023-10-26 LAB — CBC WITH DIFFERENTIAL/PLATELET
Abs Immature Granulocytes: 0.17 10*3/uL — ABNORMAL HIGH (ref 0.00–0.07)
Basophils Absolute: 0 10*3/uL (ref 0.0–0.1)
Basophils Relative: 0 %
Eosinophils Absolute: 0 10*3/uL (ref 0.0–0.5)
Eosinophils Relative: 0 %
HCT: 51.3 % — ABNORMAL HIGH (ref 36.0–46.0)
Hemoglobin: 16.9 g/dL — ABNORMAL HIGH (ref 12.0–15.0)
Immature Granulocytes: 1 %
Lymphocytes Relative: 4 %
Lymphs Abs: 1 10*3/uL (ref 0.7–4.0)
MCH: 25.8 pg — ABNORMAL LOW (ref 26.0–34.0)
MCHC: 32.9 g/dL (ref 30.0–36.0)
MCV: 78.3 fL — ABNORMAL LOW (ref 80.0–100.0)
Monocytes Absolute: 0.8 10*3/uL (ref 0.1–1.0)
Monocytes Relative: 4 %
Neutro Abs: 21.1 10*3/uL — ABNORMAL HIGH (ref 1.7–7.7)
Neutrophils Relative %: 91 %
Platelets: 430 10*3/uL — ABNORMAL HIGH (ref 150–400)
RBC: 6.55 MIL/uL — ABNORMAL HIGH (ref 3.87–5.11)
RDW: 17.6 % — ABNORMAL HIGH (ref 11.5–15.5)
WBC: 23.1 10*3/uL — ABNORMAL HIGH (ref 4.0–10.5)
nRBC: 0 % (ref 0.0–0.2)

## 2023-10-26 LAB — BASIC METABOLIC PANEL
Anion gap: 12 (ref 5–15)
BUN: 11 mg/dL (ref 6–20)
CO2: 22 mmol/L (ref 22–32)
Calcium: 9.2 mg/dL (ref 8.9–10.3)
Chloride: 100 mmol/L (ref 98–111)
Creatinine, Ser: 0.81 mg/dL (ref 0.44–1.00)
GFR, Estimated: >60 mL/min (ref >60)
Glucose, Bld: 143 mg/dL — ABNORMAL HIGH (ref 70–99)
Potassium: 3.4 mmol/L — ABNORMAL LOW (ref 3.5–5.1)
Sodium: 134 mmol/L — ABNORMAL LOW (ref 135–145)

## 2023-10-26 MED ORDER — PREDNISONE 10 MG PO TABS
ORAL_TABLET | ORAL | 0 refills | Status: AC
  Filled 2023-10-26 (×2): qty 105, 34d supply, fill #0

## 2023-10-26 MED ORDER — ONDANSETRON 4 MG PO TBDP
4.0000 mg | ORAL_TABLET | Freq: Three times a day (TID) | ORAL | 0 refills | Status: DC | PRN
  Filled 2023-10-26 (×3): qty 20, 7d supply, fill #0

## 2023-10-26 MED ORDER — POTASSIUM CHLORIDE CRYS ER 20 MEQ PO TBCR
40.0000 meq | EXTENDED_RELEASE_TABLET | ORAL | Status: AC
  Administered 2023-10-26 (×2): 40 meq via ORAL
  Filled 2023-10-26 (×2): qty 2

## 2023-10-26 MED ORDER — POTASSIUM CHLORIDE CRYS ER 20 MEQ PO TBCR
40.0000 meq | EXTENDED_RELEASE_TABLET | Freq: Once | ORAL | Status: DC
Start: 2023-10-26 — End: 2023-10-26

## 2023-10-26 NOTE — TOC Initial Note (Addendum)
Transition of Care [facility]) - Initial/Assessment Note    Patient Details  Name: Frances Mccormick MRN: [id number] Date of Birth: 06/04/1986  Transition of Care [facility]) CM/SW Contact:    Howell Rucks, RN Phone Number: 10/26/2023, 12:34 PM  Clinical Narrative:  Met with pt at bedside to introduce role of TOC/NCM and review for dc planning. Pt reports she does not currently have an established PCP, agreeable to NCM scheduling appt at one of [facility]. Pt reports no current home care services or home DME, reports she feels safe returning home with support from her family, confirmed transportation is available at discharge. TOC will continue to follow.                 -12:45pm Appointment scheduled with [facility], Appointment: Thursday, November 19, 2023 at 2:00pm. Added to AVS.   Expected Discharge Plan: Home/Self Care Barriers to Discharge: Continued Medical Work up   Patient Goals and CMS Choice Patient states their goals for this hospitalization and ongoing recovery are:: return home with support from family          Expected Discharge Plan and Services       Living arrangements for the past 2 months: Single Family Home                                      Prior Living Arrangements/Services Living arrangements for the past 2 months: Single Family Home Lives with:: Minor Children Patient language and need for interpreter reviewed:: Yes Do you feel safe going back to the place where you live?: Yes      Need for Family Participation in Patient Care: Yes (Comment) Care giver support system in place?: Yes (comment)   Criminal Activity/Legal Involvement Pertinent to Current Situation/Hospitalization: No - Comment as needed  Activities of Daily Living   ADL Screening (condition at time of admission) Independently performs ADLs?: Yes (appropriate for developmental age) Does the patient have a NEW difficulty with  bathing/dressing/toileting/self-feeding that is expected to last >3 days?: No Does the patient have a NEW difficulty with getting in/out of bed, walking, or climbing stairs that is expected to last >3 days?: No Does the patient have a NEW difficulty with communication that is expected to last >3 days?: No Is the patient deaf or have difficulty hearing?: No Does the patient have difficulty seeing, even when wearing glasses/contacts?: No Does the patient have difficulty concentrating, remembering, or making decisions?: No  Permission Sought/Granted                  Emotional Assessment Appearance:: Appears stated age Attitude/Demeanor/Rapport: Gracious Affect (typically observed): Accepting Orientation: : Oriented to Self, Oriented to Place, Oriented to  Time, Oriented to Situation Alcohol / Substance Use: Not Applicable Psych Involvement: No (comment)  Admission diagnosis:  Crohn disease (HCC) [K50.90] Intractable vomiting with nausea [R11.2] Crohn's disease with complication, unspecified gastrointestinal tract location (HCC) [K50.919] Hypertension, unspecified type [I10] Patient Active Problem List   Diagnosis Date Noted   Crohn disease (HCC) 10/25/2023   Obesity, Class III, BMI 40-49.9 (morbid obesity) (HCC) 10/25/2023   Hypertensive urgency 10/24/2023   Abdominal pain 10/24/2023   Hyponatremia 10/24/2023   Alpha thalassemia silent carrier 02/25/2022   Exacerbation of Crohn's disease without complication (HCC) 08/04/2021   Colitis 06/07/2021   Enterocolitis 06/07/2021   Hypertension 09/30/2018   Hypokalemia 09/30/2018  Leukocytosis 09/30/2018   Pilonidal abscess 09/30/2018   Medically noncompliant 10/26/2014   Crohn's disease (HCC) 10/26/2014   PCP:  Oneita Hurt No Pharmacy:   [facility] [address] [place] - [address] [place] [address] Phone: [phone] Fax: [phone]  [facility] [phone] - [place], [place] - [address] AT [facility] [address] [place] [address] Phone: [phone] Fax: [phone]  [id number] - [place], [place] - [address] AT [facility] [address] [place] [address] Phone: [phone] Fax: [phone]     Social Determinants of Health (SDOH) Social History: SDOH Screenings   Food Insecurity: No Food Insecurity (10/25/2023)  Housing: Low Risk  (10/25/2023)  Transportation Needs: No Transportation Needs (10/25/2023)  Utilities: Not At Risk (10/25/2023)  Depression (PHQ2-9): Low Risk  (04/25/2022)  Financial Resource Strain: Low Risk  (11/25/2021)   Received from [facility]  Physical Activity: Insufficiently Active (11/25/2021)   Received from [facility]  Social Connections: Unknown (04/07/2022)   Received from [facility]  Stress: No Stress Concern Present (11/25/2021)   Received from [facility], [facility]  Tobacco Use: Low Risk  (10/24/2023)   SDOH Interventions:     Readmission Risk Interventions     No data to display

## 2023-10-26 NOTE — Discharge Summary (Signed)
Physician Discharge Summary  Frances Mccormick UJW:[id number] DOB: 03/04/1986 DOA: 10/24/2023  PCP: Pcp, No  Admit date: 10/24/2023 Discharge date: 10/26/2023    Admitted From: Home Disposition: Home  Recommendations for Outpatient Follow-up:  Follow up with PCP in 1-2 weeks Please obtain BMP/CBC in one week Follow-up with GI in 2 to 3 weeks Please follow up with your PCP on the following pending results: Unresulted Labs (From admission, onward)    None         Home Health: None Equipment/Devices: None  Discharge Condition: Stable CODE STATUS: Full code Diet recommendation: Cardiac  Subjective: Seen and examined earlier.  She said that she was feeling some bloating, abdominal pain along with nausea last night after she ate fried food, that she now realizes that was a mistake by her.  This morning she did not want to eat any breakfast but she was very eager to go home and see her 41-month-old son.  We decided to watch her few more hours, let her eat lunch and see how she feels.  Per nurse, she ate chicken noodle soup in the lunch and has not had any further nausea or vomiting or abdominal pain and patient has requested discharge since she thinks she can handle her mild symptoms at home.  Brief/Interim Summary: Frances Mccormick is a 37 y.o. female with medical history significant of Crohn's disease, morbid obesity who presented with abdominal pain.  On arrival, she was afebrile with blood pressure of 184/100 but otherwise hemodynamically stable. CBC with leukocytosis of 19.1. CT abdomen pelvis showed similar enteritis in the ileum compared to CT imaging in 2022.  No abscess.  Chronic inflammation noted to right and transverse colon. Patient was given IV opioids, antiemetics, multiple fluid boluses but continues to be symptomatic.  Admitted under hospitalist for Acute exacerbation of Crohn's disease/enteritis/nausea/vomiting/abdominal pain, started on IV Solu-Medrol, Procalcitonin  unremarkable.  Although she did have some abdominal pain and nausea this morning but she has tolerated lunch and she is requesting discharge.  I am discharging her on long taper of steroids and have strongly advised her to follow-up with her previous GI Dr. [name] or any other GI of her choice within 2 to 4 weeks.   Hypertensive urgency: Blood pressure as high as 201 systolic and 145 diastolic.  Patient's blood pressure has been either elevated in hypertensive urgency range or totally normal.  This variation correlates with the timing of her pain, indicating that pain is likely the triggering factor for her hypertension but otherwise she has no history of hypertension and currently blood pressure is within normal range.     Class III obesity: Dietary modification and weight loss counseled.   Hypovolemic hyponatremia: Slight drop from 134 yesterday to 130 today.  Recheck sodium later today and monitor closely.   Hypokalemia: Low again, will replenish before discharge.   Marijuana abuse: UDS positive for marijuana.  Counseling provided.  Discharge plan was discussed with patient and/or family member and they verbalized understanding and agreed with it.  Discharge Diagnoses:  Principal Problem:   Abdominal pain Active Problems:   Crohn's disease (HCC)   Hypertensive urgency   Hyponatremia   Obesity, Class III, BMI 40-49.9 (morbid obesity) (HCC)    Discharge Instructions   Allergies as of 10/26/2023       Reactions   Bee Venom Anaphylaxis   Ciprofloxacin Anaphylaxis        Medication List     TAKE these medications    Norethindrone Acetate-Ethinyl  Estrad-FE 1-20 MG-MCG(24) tablet Commonly known as: LOESTRIN 24 FE Take 1 tablet by mouth daily.   ondansetron 4 MG disintegrating tablet Commonly known as: ZOFRAN-ODT Take 1 tablet (4 mg total) by mouth every 8 (eight) hours as needed for nausea or vomiting.   ondansetron 4 MG disintegrating tablet Commonly known as:  ZOFRAN-ODT Take 1 tablet (4 mg total) by mouth every 8 (eight) hours as needed for nausea or vomiting.   predniSONE 20 MG tablet Commonly known as: DELTASONE Take 2 tablets (40 mg total) by mouth daily.   predniSONE 10 MG tablet Commonly known as: DELTASONE Take 5 tablets (50 mg total) by mouth daily for 7 days, THEN 4 tablets (40 mg total) daily for 7 days, THEN 3 tablets (30 mg total) daily for 7 days, THEN 2 tablets (20 mg total) daily for 7 days, THEN 1 tablet (10 mg total) daily for 7 days. Start taking on: October 26, 2023        Follow-up Information      [facility]   Specialty: Internal Medicine Why: Appointment: Thursday, November 19, 2023 at 2:00pm   Please call clinic at least  24 hrs in advance to cancel or reschedule appointment.  Thank you. Contact information: [address] [place] [address] [phone]        Warden Fillers, MD Follow up in 1 week(s).   Specialty: Obstetrics and Gynecology Contact information: [address] [place] [address] [phone]         Jeani Hawking, MD Follow up in 2 week(s).   Specialty: Gastroenterology Contact information: [address] [place] [address] [phone]                Allergies  Allergen Reactions   Bee Venom Anaphylaxis   Ciprofloxacin Anaphylaxis    Consultations: None   Procedures/Studies: CT ABDOMEN PELVIS W CONTRAST  Result Date: 10/24/2023 CLINICAL DATA:  Nausea, vomiting, history of Crohn's disease. EXAM: CT ABDOMEN AND PELVIS WITH CONTRAST TECHNIQUE: Multidetector CT imaging of the abdomen and pelvis was performed using the standard protocol following bolus administration of intravenous contrast. RADIATION DOSE REDUCTION: This exam was performed according to the departmental dose-optimization program which includes automated exposure control, adjustment of the mA and/or kV according to patient size and/or use  of iterative reconstruction technique. CONTRAST:  OMNIPAQUE IOHEXOL 300 MG/ML  SOLN COMPARISON:  08/04/2021 FINDINGS: Lower chest: No acute abnormality. Hepatobiliary: Focal fatty infiltration along the falciform ligament. Liver is otherwise unremarkable. No biliary dilation. Unremarkable gallbladder. Pancreas: Unremarkable. No pancreatic ductal dilatation or surrounding inflammatory changes. Spleen: Unremarkable. Adrenals/Urinary Tract: Stable adrenal glands. No urinary calculi or hydronephrosis. Unremarkable bladder. Stomach/Bowel: Stomach and appendix are within normal limits. No bowel obstruction. Decompressed ileum with mild mucosal hyperenhancement similar to prior. No significant adjacent stranding. No abscess. There is fatty infiltration of the colon greatest about the right and transverse colon. No adjacent stranding or fluid. Vascular/Lymphatic: No significant vascular findings are present. No enlarged abdominal or pelvic lymph nodes. Reproductive: Uterus and bilateral adnexa are unremarkable. Other: No free intraperitoneal fluid or air. Musculoskeletal: No acute fracture. IMPRESSION: 1. Similar enteritis in the ileum compared with 08/04/2021 and compatible with history of Crohn's disease. No abscess or penetrating disease. 2. Fatty infiltration of the colon greatest about the right and transverse colon consistent with sequela of chronic inflammation. No evidence of acute colitis. Electronically Signed   By: Minerva Fester M.D.   On: 10/24/2023  19:46     Discharge Exam: Vitals:   10/26/23 0640 10/26/23 1411  BP: (!) 145/83 132/85  Pulse: 73 73  Resp:  16  Temp:  98.5 F (36.9 C)  SpO2: 100% 100%   Vitals:   10/26/23 0510 10/26/23 0530 10/26/23 0640 10/26/23 1411  BP: (!) 211/129 (!) 202/100 (!) 145/83 132/85  Pulse: (!) 59  73 73  Resp:    16  Temp:    98.5 F (36.9 C)  TempSrc:    Oral  SpO2:   100% 100%  Weight:      Height:        General: Pt is alert, awake, not in  acute distress Cardiovascular: RRR, S1/S2 +, no rubs, no gallops Respiratory: CTA bilaterally, no wheezing, no rhonchi Abdominal: Soft, NT, ND, bowel sounds + Extremities: no edema, no cyanosis    The results of significant diagnostics from this hospitalization (including imaging, microbiology, ancillary and laboratory) are listed below for reference.     Microbiology: No results found for this or any previous visit (from the past 240 hour(s)).   Labs: BNP (last 3 results) No results for input(s): "BNP" in the last 8760 hours. Basic Metabolic Panel: Recent Labs  Lab 10/24/23 1504 10/25/23 0416 10/26/23 0347  NA 134* 130* 134*  K 3.5 3.4* 3.4*  CL 100 98 100  CO2 23 20* 22  GLUCOSE 120* 135* 143*  BUN 7 8 11   CREATININE 0.78 0.79 0.81  CALCIUM 8.9 8.3* 9.2   Liver Function Tests: Recent Labs  Lab 10/24/23 1504  AST 23  ALT 17  ALKPHOS 79  BILITOT 0.4  PROT 9.0*  ALBUMIN 4.3   Recent Labs  Lab 10/24/23 1504  LIPASE 27   No results for input(s): "AMMONIA" in the last 168 hours. CBC: Recent Labs  Lab 10/24/23 1504 10/25/23 0416 10/26/23 0347  WBC 19.1* 17.9* 23.1*  NEUTROABS 17.6*  --  21.1*  HGB 15.9* 14.8 16.9*  HCT 50.2* 45.1 51.3*  MCV 81.0 79.8* 78.3*  PLT 409* 364 430*   Cardiac Enzymes: No results for input(s): "CKTOTAL", "CKMB", "CKMBINDEX", "TROPONINI" in the last 168 hours. BNP: Invalid input(s): "POCBNP" CBG: Recent Labs  Lab 10/25/23 0731  GLUCAP 124*   D-Dimer No results for input(s): "DDIMER" in the last 72 hours. Hgb A1c No results for input(s): "HGBA1C" in the last 72 hours. Lipid Profile No results for input(s): "CHOL", "HDL", "LDLCALC", "TRIG", "CHOLHDL", "LDLDIRECT" in the last 72 hours. Thyroid function studies No results for input(s): "TSH", "T4TOTAL", "T3FREE", "THYROIDAB" in the last 72 hours.  Invalid input(s): "FREET3" Anemia work up No results for input(s): "VITAMINB12", "FOLATE", "FERRITIN", "TIBC", "IRON",  "RETICCTPCT" in the last 72 hours. Urinalysis    Component Value Date/Time   COLORURINE COLORLESS (A) 10/24/2023 2119   APPEARANCEUR CLEAR 10/24/2023 2119   LABSPEC 1.026 10/24/2023 2119   PHURINE 7.0 10/24/2023 2119   GLUCOSEU NEGATIVE 10/24/2023 2119   HGBUR SMALL (A) 10/24/2023 2119   BILIRUBINUR NEGATIVE 10/24/2023 2119   KETONESUR 20 (A) 10/24/2023 2119   PROTEINUR NEGATIVE 10/24/2023 2119   UROBILINOGEN 0.2 06/08/2015 0736   NITRITE NEGATIVE 10/24/2023 2119   LEUKOCYTESUR NEGATIVE 10/24/2023 2119   Sepsis Labs Recent Labs  Lab 10/24/23 1504 10/25/23 0416 10/26/23 0347  WBC 19.1* 17.9* 23.1*   Microbiology No results found for this or any previous visit (from the past 240 hour(s)).  FURTHER DISCHARGE INSTRUCTIONS:   Get Medicines reviewed and adjusted: Please take all your medications with you  for your next visit with your Primary MD   Laboratory/radiological data: Please request your Primary MD to go over all hospital tests and procedure/radiological results at the follow up, please ask your Primary MD to get all Hospital records sent to his/her office.   In some cases, they will be blood work, cultures and biopsy results pending at the time of your discharge. Please request that your primary care M.D. goes through all the records of your hospital data and follows up on these results.   Also Note the following: If you experience worsening of your admission symptoms, develop shortness of breath, life threatening emergency, suicidal or homicidal thoughts you must seek medical attention immediately by calling 911 or calling your MD immediately  if symptoms less severe.   You must read complete instructions/literature along with all the possible adverse reactions/side effects for all the Medicines you take and that have been prescribed to you. Take any new Medicines after you have completely understood and accpet all the possible adverse reactions/side effects.    Do not  drive when taking Pain medications or sleeping medications (Benzodaizepines)   Do not take more than prescribed Pain, Sleep and Anxiety Medications. It is not advisable to combine anxiety,sleep and pain medications without talking with your primary care practitioner   Special Instructions: If you have smoked or chewed Tobacco  in the last 2 yrs please stop smoking, stop any regular Alcohol  and or any Recreational drug use.   Wear Seat belts while driving.   Please note: You were cared for by a hospitalist during your hospital stay. Once you are discharged, your primary care physician will handle any further medical issues. Please note that NO REFILLS for any discharge medications will be authorized once you are discharged, as it is imperative that you return to your primary care physician (or establish a relationship with a primary care physician if you do not have one) for your post hospital discharge needs so that they can reassess your need for medications and monitor your lab values  Time coordinating discharge: Over 30 minutes  SIGNED:   Hughie Closs, MD  [facility] 10/26/2023, 2:23 PM *Please note that this is a verbal dictation therefore any spelling or grammatical errors are due to the "[employer]agon [employer] One" system interpretation. If 7PM-7AM, please contact night-coverage [email or web address]

## 2023-10-27 ENCOUNTER — Encounter (HOSPITAL_COMMUNITY): Payer: Self-pay | Admitting: Emergency Medicine

## 2023-10-27 ENCOUNTER — Other Ambulatory Visit: Payer: Self-pay

## 2023-10-27 ENCOUNTER — Emergency Department (HOSPITAL_COMMUNITY)
Admission: EM | Admit: 2023-10-27 | Discharge: 2023-10-27 | Disposition: A | Discharge status: Home / Self Care | Payer: Medicaid Other | Attending: Emergency Medicine | Admitting: Emergency Medicine

## 2023-10-27 DIAGNOSIS — R1031 Right lower quadrant pain: Secondary | ICD-10-CM | POA: Insufficient documentation

## 2023-10-27 DIAGNOSIS — R1030 Lower abdominal pain, unspecified: Secondary | ICD-10-CM | POA: Diagnosis present

## 2023-10-27 DIAGNOSIS — R1032 Left lower quadrant pain: Secondary | ICD-10-CM | POA: Insufficient documentation

## 2023-10-27 DIAGNOSIS — R319 Hematuria, unspecified: Secondary | ICD-10-CM | POA: Diagnosis not present

## 2023-10-27 DIAGNOSIS — R109 Unspecified abdominal pain: Secondary | ICD-10-CM

## 2023-10-27 DIAGNOSIS — R944 Abnormal results of kidney function studies: Secondary | ICD-10-CM | POA: Insufficient documentation

## 2023-10-27 DIAGNOSIS — R1084 Generalized abdominal pain: Secondary | ICD-10-CM | POA: Diagnosis not present

## 2023-10-27 DIAGNOSIS — D72829 Elevated white blood cell count, unspecified: Secondary | ICD-10-CM | POA: Diagnosis not present

## 2023-10-27 DIAGNOSIS — R110 Nausea: Secondary | ICD-10-CM | POA: Diagnosis not present

## 2023-10-27 LAB — URINALYSIS, ROUTINE W REFLEX MICROSCOPIC
Bilirubin Urine: NEGATIVE
Glucose, UA: NEGATIVE mg/dL
Ketones, ur: 20 mg/dL — AB
Leukocytes,Ua: NEGATIVE
Nitrite: NEGATIVE
Protein, ur: 100 mg/dL — AB
Specific Gravity, Urine: 1.023 (ref 1.005–1.030)
pH: 6 (ref 5.0–8.0)

## 2023-10-27 LAB — CBC
HCT: 48.7 % — ABNORMAL HIGH (ref 36.0–46.0)
Hemoglobin: 16.5 g/dL — ABNORMAL HIGH (ref 12.0–15.0)
MCH: 26.2 pg (ref 26.0–34.0)
MCHC: 33.9 g/dL (ref 30.0–36.0)
MCV: 77.3 fL — ABNORMAL LOW (ref 80.0–100.0)
Platelets: 392 10*3/uL (ref 150–400)
RBC: 6.3 MIL/uL — ABNORMAL HIGH (ref 3.87–5.11)
RDW: 17.2 % — ABNORMAL HIGH (ref 11.5–15.5)
WBC: 22.3 10*3/uL — ABNORMAL HIGH (ref 4.0–10.5)
nRBC: 0 % (ref 0.0–0.2)

## 2023-10-27 LAB — COMPREHENSIVE METABOLIC PANEL
ALT: 21 U/L (ref 0–44)
AST: 25 U/L (ref 15–41)
Albumin: 4.3 g/dL (ref 3.5–5.0)
Alkaline Phosphatase: 67 U/L (ref 38–126)
Anion gap: 11 (ref 5–15)
BUN: 15 mg/dL (ref 6–20)
CO2: 24 mmol/L (ref 22–32)
Calcium: 8.8 mg/dL — ABNORMAL LOW (ref 8.9–10.3)
Chloride: 100 mmol/L (ref 98–111)
Creatinine, Ser: 1.01 mg/dL — ABNORMAL HIGH (ref 0.44–1.00)
GFR, Estimated: >60 mL/min (ref >60)
Glucose, Bld: 124 mg/dL — ABNORMAL HIGH (ref 70–99)
Potassium: 4.1 mmol/L (ref 3.5–5.1)
Sodium: 135 mmol/L (ref 135–145)
Total Bilirubin: 0.9 mg/dL (ref <1.2)
Total Protein: 8.7 g/dL — ABNORMAL HIGH (ref 6.5–8.1)

## 2023-10-27 LAB — HCG, SERUM, QUALITATIVE: Preg, Serum: NEGATIVE

## 2023-10-27 LAB — LIPASE, BLOOD: Lipase: 29 U/L (ref 11–51)

## 2023-10-27 MED ORDER — ONDANSETRON HCL 4 MG/2ML IJ SOLN
4.0000 mg | Freq: Once | INTRAMUSCULAR | Status: AC
  Administered 2023-10-27: 4 mg via INTRAVENOUS
  Filled 2023-10-27: qty 2

## 2023-10-27 MED ORDER — HYDROMORPHONE HCL 1 MG/ML IJ SOLN
1.0000 mg | Freq: Once | INTRAMUSCULAR | Status: AC
  Administered 2023-10-27: 1 mg via INTRAVENOUS
  Filled 2023-10-27: qty 1

## 2023-10-27 MED ORDER — SODIUM CHLORIDE 0.9 % IV BOLUS
1000.0000 mL | Freq: Once | INTRAVENOUS | Status: AC
  Administered 2023-10-27: 1000 mL via INTRAVENOUS

## 2023-10-27 MED ORDER — PROMETHAZINE HCL 25 MG RE SUPP: 25.0000 mg | Freq: Four times a day (QID) | RECTAL | 0 refills | Status: DC | PRN

## 2023-10-27 MED ORDER — PROMETHAZINE HCL 25 MG PO TABS: 25.0000 mg | ORAL_TABLET | Freq: Four times a day (QID) | ORAL | 0 refills | Status: DC | PRN

## 2023-10-27 MED ORDER — METHYLPREDNISOLONE SODIUM SUCC 125 MG IJ SOLR
60.0000 mg | Freq: Once | INTRAMUSCULAR | Status: AC
  Administered 2023-10-27: 60 mg via INTRAVENOUS
  Filled 2023-10-27: qty 2

## 2023-10-27 MED ORDER — OXYCODONE-ACETAMINOPHEN 5-325 MG PO TABS: 1.0000 | ORAL_TABLET | Freq: Four times a day (QID) | ORAL | 0 refills | Status: DC | PRN

## 2023-10-27 NOTE — Discharge Instructions (Addendum)
Evaluation today was overall reassuring.  I have sent oxycodone and Phenergan to your pharmacy.  Also recommend you follow-up with your gastroenterologist.  If you develop worsening abdominal pain, worsening nausea vomiting, develop a fever, bloody stools or any other concerning symptom please return to the emergency department further evaluation.

## 2023-10-27 NOTE — ED Provider Notes (Addendum)
[place] EMERGENCY DEPARTMENT AT [facility] Provider Note   CSN: [id number] Arrival date & time: 10/27/23  0302     History  Chief Complaint  Patient presents with   Abdominal Pain   HPI Frances Mccormick is a 37 y.o. female with Crohn's disease resenting for abdominal pain.  States it started 3 days ago.  Located primarily all along the lower abdomen.  States she was discharged from the hospital yesterday for "Crohn's flare".  States since then she has been vomiting nonstop has not had a bowel movement since Saturday.  Output is nonbloody.  Denies urinary symptoms.  Denies fever.  States the lower abdominal pain feels very similar to when she was here in the hospital but seems to be worsening.   Abdominal Pain      Home Medications Prior to Admission medications   Medication Sig Start Date End Date Taking? Authorizing Provider  promethazine (PHENERGAN) 25 MG suppository Place 1 suppository (25 mg total) rectally every 6 (six) hours as needed for nausea or vomiting. 10/27/23  Yes Gareth Eagle, PA-C  promethazine (PHENERGAN) 25 MG tablet Take 1 tablet (25 mg total) by mouth every 6 (six) hours as needed for nausea or vomiting. 10/27/23  Yes Gareth Eagle, PA-C  Norethindrone Acetate-Ethinyl Estrad-FE (LOESTRIN 24 FE) 1-20 MG-MCG(24) tablet Take 1 tablet by mouth daily. Patient not taking: Reported on 10/24/2023 06/15/23   Corlis Hove, NP  ondansetron (ZOFRAN-ODT) 4 MG disintegrating tablet Take 1 tablet (4 mg total) by mouth every 8 (eight) hours as needed for nausea or vomiting. 10/24/23   Barrett, Horald Chestnut, PA-C  ondansetron (ZOFRAN-ODT) 4 MG disintegrating tablet Take 1 tablet (4 mg total) by mouth every 8 (eight) hours as needed for nausea or vomiting. 10/26/23   Hughie Closs, MD  predniSONE (DELTASONE) 10 MG tablet Take 5 tablets (50 mg total) by mouth daily for 7 days, THEN 4 tablets (40 mg total) daily for 7 days, THEN 3 tablets (30 mg total) daily for 7  days, THEN 2 tablets (20 mg total) daily for 7 days, THEN 1 tablet (10 mg total) daily for 7 days. 10/26/23 11/30/23  Hughie Closs, MD  predniSONE (DELTASONE) 20 MG tablet Take 2 tablets (40 mg total) by mouth daily. 10/24/23   Barrett, Horald Chestnut, PA-C      Allergies    Bee venom and Ciprofloxacin    Review of Systems   Review of Systems  Gastrointestinal:  Positive for abdominal pain.    Physical Exam Updated Vital Signs BP 139/78   Pulse 74   Temp 99.6 F (37.6 C) (Oral)   Resp 16   Ht 5' (1.524 m)   Wt 90.7 kg   LMP  (LMP Unknown)   SpO2 99%   BMI 39.06 kg/m  Physical Exam Vitals and nursing note reviewed.  HENT:     Head: Normocephalic and atraumatic.     Mouth/Throat:     Mouth: Mucous membranes are moist.  Eyes:     General:        Right eye: No discharge.        Left eye: No discharge.     Conjunctiva/sclera: Conjunctivae normal.  Cardiovascular:     Rate and Rhythm: Normal rate and regular rhythm.     Pulses: Normal pulses.     Heart sounds: Normal heart sounds.  Pulmonary:     Effort: Pulmonary effort is normal.     Breath sounds: Normal breath sounds.  Abdominal:  General: Abdomen is flat.     Palpations: Abdomen is soft.     Tenderness: There is abdominal tenderness in the right lower quadrant, suprapubic area and left lower quadrant. There is no right CVA tenderness or left CVA tenderness.  Skin:    General: Skin is warm and dry.  Neurological:     General: No focal deficit present.  Psychiatric:        Mood and Affect: Mood normal.     ED Results / Procedures / Treatments   Labs (all labs ordered are listed, but only abnormal results are displayed) Labs Reviewed  COMPREHENSIVE METABOLIC PANEL - Abnormal; Notable for the following components:      Result Value   Glucose, Bld 124 (*)    Creatinine, Ser 1.01 (*)    Calcium 8.8 (*)    Total Protein 8.7 (*)    All other components within normal limits  CBC - Abnormal; Notable for the  following components:   WBC 22.3 (*)    RBC 6.30 (*)    Hemoglobin 16.5 (*)    HCT 48.7 (*)    MCV 77.3 (*)    RDW 17.2 (*)    All other components within normal limits  URINALYSIS, ROUTINE W REFLEX MICROSCOPIC - Abnormal; Notable for the following components:   APPearance HAZY (*)    Hgb urine dipstick MODERATE (*)    Ketones, ur 20 (*)    Protein, ur 100 (*)    Bacteria, UA RARE (*)    All other components within normal limits  LIPASE, BLOOD  HCG, SERUM, QUALITATIVE    EKG None  Radiology No results found.  Procedures Procedures    Medications Ordered in ED Medications  HYDROmorphone (DILAUDID) injection 1 mg (1 mg Intravenous Given 10/27/23 0412)  sodium chloride 0.9 % bolus 1,000 mL (0 mLs Intravenous Stopped 10/27/23 0537)  ondansetron (ZOFRAN) injection 4 mg (4 mg Intravenous Given 10/27/23 0412)  methylPREDNISolone sodium succinate (SOLU-MEDROL) 125 mg/2 mL injection 60 mg (60 mg Intravenous Given 10/27/23 0444)    ED Course/ Medical Decision Making/ A&P                                 Medical Decision Making Amount and/or Complexity of Data Reviewed Labs: ordered.  Risk Prescription drug management.   Initial Impression and Ddx 37 year old well-appearing female presenting for abdominal pain.  Exam notable for lower abdominal tenderness.  DDx includes Crohn's flare, diverticulitis, pyelonephritis, kidney stone, electrolyte derangement, sepsis, other. Patient PMH that increases complexity of ED encounter: Crohn's disease  Interpretation of Diagnostics - I independent reviewed and interpreted the labs as followed: Leukocytosis (improved from prior), hematuria, elevated creatinine, hematuria    Patient Reassessment and Ultimate Disposition/Management On reassessment, patient stated that her pain was much better.  Fluid challenge with no issue.  Suspect her symptoms are related to her Crohn's.  Workup does not suggest sepsis at this time.  Remained  well-appearing, no acute distress and hemodynamically stable.  Advised her to follow-up with her gastroenterologist.  Also advised that she continue with the prednisone.  Also sent Phenergan suppository and Phenergan to her pharmacy.  Discussed return precautions.  Vital stable discharge condition.  Also discussed findings of urinalysis which revealed hematuria.  Patient denies urinary symptoms at this time and has no flank tenderness.  Advised to seek out treatment if she develops urinary symptoms and or develop flank pain.  Patient  management required discussion with the following services or consulting groups:  None  Complexity of Problems Addressed Acute complicated illness or Injury  Additional Data Reviewed and Analyzed Further history obtained from: Past medical history and medications listed in the EMR and Prior ED visit notes  Patient Encounter Risk Assessment Prescriptions     Final Clinical Impression(s) / ED Diagnoses Final diagnoses:  Abdominal pain, unspecified abdominal location    Rx / DC Orders ED Discharge Orders          Ordered    promethazine (PHENERGAN) 25 MG suppository  Every 6 hours PRN        10/27/23 0540    promethazine (PHENERGAN) 25 MG tablet  Every 6 hours PRN        10/27/23 0540              Gareth Eagle, PA-C 10/27/23 0553    Zadie Rhine, MD 10/27/23 0554    Gareth Eagle, PA-C 10/27/23 1610    Zadie Rhine, MD 10/27/23 [phone]

## 2023-10-27 NOTE — ED Notes (Signed)
Pt given water for PO challenge 

## 2023-10-27 NOTE — ED Triage Notes (Addendum)
Pt bib [facility] from home for abdominal pain x3 days. Admitted on Saturday for crohn's and discharged yesterday (11/25). Patient states the pain and nausea came back despite medication prescribed at discharge.

## 2023-11-19 ENCOUNTER — Ambulatory Visit: Payer: Medicaid Other | Admitting: Family Medicine

## 2023-12-08 ENCOUNTER — Ambulatory Visit: Payer: Medicaid Other | Admitting: Family Medicine

## 2023-12-08 ENCOUNTER — Telehealth: Payer: Self-pay | Admitting: Family Medicine

## 2023-12-08 NOTE — Telephone Encounter (Signed)
 Copied from CRM [id number]. Topic: Appointments - Appointment Cancel/Reschedule >> Dec 08, 2023  8:22 AM Franky GRADE wrote: Patient/patient representative is calling to cancel or reschedule an appointment. Refer to attachments for appointment information. Patient is unable to make her appointment for today at 8:50 am. She is having car trouble.

## 2023-12-15 ENCOUNTER — Inpatient Hospital Stay: Payer: Medicaid Other | Admitting: Family Medicine

## 2023-12-16 ENCOUNTER — Ambulatory Visit: Payer: Medicaid Other | Admitting: Family Medicine

## 2023-12-23 ENCOUNTER — Ambulatory Visit: Payer: Medicaid Other | Admitting: Family Medicine

## 2023-12-28 ENCOUNTER — Ambulatory Visit: Payer: Medicaid Other | Admitting: Family Medicine

## 2023-12-30 ENCOUNTER — Ambulatory Visit: Payer: Medicaid Other | Admitting: Family Medicine

## 2024-02-05 ENCOUNTER — Emergency Department (HOSPITAL_COMMUNITY)

## 2024-02-05 ENCOUNTER — Encounter (HOSPITAL_COMMUNITY): Payer: Self-pay

## 2024-02-05 ENCOUNTER — Other Ambulatory Visit: Payer: Self-pay

## 2024-02-05 ENCOUNTER — Emergency Department (HOSPITAL_COMMUNITY)
Admission: EM | Admit: 2024-02-05 | Discharge: 2024-02-05 | Disposition: A | Discharge status: Home / Self Care | Attending: Emergency Medicine | Admitting: Emergency Medicine

## 2024-02-05 DIAGNOSIS — R1115 Cyclical vomiting syndrome unrelated to migraine: Secondary | ICD-10-CM | POA: Insufficient documentation

## 2024-02-05 DIAGNOSIS — R110 Nausea: Secondary | ICD-10-CM | POA: Diagnosis not present

## 2024-02-05 DIAGNOSIS — K5090 Crohn's disease, unspecified, without complications: Secondary | ICD-10-CM | POA: Diagnosis not present

## 2024-02-05 DIAGNOSIS — K449 Diaphragmatic hernia without obstruction or gangrene: Secondary | ICD-10-CM | POA: Diagnosis not present

## 2024-02-05 DIAGNOSIS — R1111 Vomiting without nausea: Secondary | ICD-10-CM | POA: Diagnosis not present

## 2024-02-05 DIAGNOSIS — Z79899 Other long term (current) drug therapy: Secondary | ICD-10-CM | POA: Diagnosis not present

## 2024-02-05 DIAGNOSIS — I10 Essential (primary) hypertension: Secondary | ICD-10-CM | POA: Diagnosis not present

## 2024-02-05 DIAGNOSIS — R1084 Generalized abdominal pain: Secondary | ICD-10-CM | POA: Diagnosis not present

## 2024-02-05 DIAGNOSIS — K429 Umbilical hernia without obstruction or gangrene: Secondary | ICD-10-CM | POA: Diagnosis not present

## 2024-02-05 DIAGNOSIS — R109 Unspecified abdominal pain: Secondary | ICD-10-CM | POA: Diagnosis present

## 2024-02-05 LAB — CBC
HCT: 52.9 % — ABNORMAL HIGH (ref 36.0–46.0)
Hemoglobin: 17.1 g/dL — ABNORMAL HIGH (ref 12.0–15.0)
MCH: 26.5 pg (ref 26.0–34.0)
MCHC: 32.3 g/dL (ref 30.0–36.0)
MCV: 81.9 fL (ref 80.0–100.0)
Platelets: 357 10*3/uL (ref 150–400)
RBC: 6.46 MIL/uL — ABNORMAL HIGH (ref 3.87–5.11)
RDW: 16.7 % — ABNORMAL HIGH (ref 11.5–15.5)
WBC: 18.1 10*3/uL — ABNORMAL HIGH (ref 4.0–10.5)
nRBC: 0 % (ref 0.0–0.2)

## 2024-02-05 LAB — URINALYSIS, ROUTINE W REFLEX MICROSCOPIC
Bacteria, UA: NONE SEEN
Bilirubin Urine: NEGATIVE
Glucose, UA: 50 mg/dL — AB
Hgb urine dipstick: NEGATIVE
Ketones, ur: 20 mg/dL — AB
Leukocytes,Ua: NEGATIVE
Nitrite: NEGATIVE
Protein, ur: 30 mg/dL — AB
Specific Gravity, Urine: 1.01 (ref 1.005–1.030)
pH: 8 (ref 5.0–8.0)

## 2024-02-05 LAB — COMPREHENSIVE METABOLIC PANEL
ALT: 16 U/L (ref 0–44)
AST: 26 U/L (ref 15–41)
Albumin: 5.4 g/dL — ABNORMAL HIGH (ref 3.5–5.0)
Alkaline Phosphatase: 78 U/L (ref 38–126)
Anion gap: 15 (ref 5–15)
BUN: 8 mg/dL (ref 6–20)
CO2: 21 mmol/L — ABNORMAL LOW (ref 22–32)
Calcium: 9.5 mg/dL (ref 8.9–10.3)
Chloride: 101 mmol/L (ref 98–111)
Creatinine, Ser: 0.83 mg/dL (ref 0.44–1.00)
GFR, Estimated: >60 mL/min (ref >60)
Glucose, Bld: 141 mg/dL — ABNORMAL HIGH (ref 70–99)
Potassium: 3.7 mmol/L (ref 3.5–5.1)
Sodium: 137 mmol/L (ref 135–145)
Total Bilirubin: 0.7 mg/dL (ref 0.0–1.2)
Total Protein: 10.1 g/dL — ABNORMAL HIGH (ref 6.5–8.1)

## 2024-02-05 LAB — LIPASE, BLOOD: Lipase: 27 U/L (ref 11–51)

## 2024-02-05 LAB — RAPID URINE DRUG SCREEN, HOSP PERFORMED
Amphetamines: NOT DETECTED
Barbiturates: NOT DETECTED
Benzodiazepines: NOT DETECTED
Cocaine: NOT DETECTED
Opiates: NOT DETECTED
Tetrahydrocannabinol: POSITIVE — AB

## 2024-02-05 LAB — CBG MONITORING, ED: Glucose-Capillary: 135 mg/dL — ABNORMAL HIGH (ref 70–99)

## 2024-02-05 LAB — HCG, SERUM, QUALITATIVE: Preg, Serum: NEGATIVE

## 2024-02-05 MED ORDER — ONDANSETRON HCL 4 MG PO TABS: 4.0000 mg | ORAL_TABLET | Freq: Four times a day (QID) | ORAL | 0 refills | Status: DC

## 2024-02-05 MED ORDER — DROPERIDOL 2.5 MG/ML IJ SOLN
2.5000 mg | Freq: Once | INTRAMUSCULAR | Status: AC
  Administered 2024-02-05: 2.5 mg via INTRAVENOUS
  Filled 2024-02-05: qty 2

## 2024-02-05 MED ORDER — PROMETHAZINE HCL 25 MG/ML IJ SOLN
12.5000 mg | Freq: Four times a day (QID) | INTRAMUSCULAR | Status: DC | PRN
  Administered 2024-02-05: 12.5 mg via INTRAVENOUS
  Filled 2024-02-05: qty 12.5

## 2024-02-05 MED ORDER — DICYCLOMINE HCL 20 MG PO TABS: 20.0000 mg | ORAL_TABLET | Freq: Two times a day (BID) | ORAL | 0 refills | Status: DC

## 2024-02-05 MED ORDER — ONDANSETRON HCL 4 MG/2ML IJ SOLN
4.0000 mg | Freq: Once | INTRAMUSCULAR | Status: AC
  Administered 2024-02-05: 4 mg via INTRAVENOUS
  Filled 2024-02-05: qty 2

## 2024-02-05 MED ORDER — IOHEXOL 300 MG/ML  SOLN
100.0000 mL | Freq: Once | INTRAMUSCULAR | Status: AC | PRN
  Administered 2024-02-05: 100 mL via INTRAVENOUS

## 2024-02-05 MED ORDER — LACTATED RINGERS IV BOLUS
1000.0000 mL | Freq: Once | INTRAVENOUS | Status: AC
  Administered 2024-02-05: 1000 mL via INTRAVENOUS

## 2024-02-05 MED ORDER — PROMETHAZINE HCL 25 MG RE SUPP: 25.0000 mg | Freq: Four times a day (QID) | RECTAL | 0 refills | Status: DC | PRN

## 2024-02-05 MED ORDER — KETOROLAC TROMETHAMINE 30 MG/ML IJ SOLN
15.0000 mg | Freq: Once | INTRAMUSCULAR | Status: AC
  Administered 2024-02-05: 15 mg via INTRAVENOUS
  Filled 2024-02-05: qty 1

## 2024-02-05 NOTE — ED Provider Notes (Signed)
 [place] EMERGENCY DEPARTMENT AT [facility] Provider Note   CSN: [id number] Arrival date & time: 02/05/24  1132     History Chief Complaint  Patient presents with   Abdominal Pain    HPI Frances Mccormick is a 38 y.o. female presenting for acute on chronic abdominal pain.  History of Crohn's disease.  Does not follow-up with her gastroenterologist.  Third reported Crohn's flare in the last 2 months. Still no follow-up established. Endorses since 4 AM sudden onset severe abdominal cramping and nausea.  Endorses vomiting. Denies fevers chills, diarrhea.   Patient's recorded medical, surgical, social, medication list and allergies were reviewed in the Snapshot window as part of the initial history.   Review of Systems   Review of Systems  Constitutional:  Negative for chills and fever.  HENT:  Negative for ear pain and sore throat.   Eyes:  Negative for pain and visual disturbance.  Respiratory:  Negative for cough and shortness of breath.   Cardiovascular:  Negative for chest pain and palpitations.  Gastrointestinal:  Positive for abdominal pain, nausea and vomiting. Negative for diarrhea.  Genitourinary:  Negative for dysuria and hematuria.  Musculoskeletal:  Negative for arthralgias and back pain.  Skin:  Negative for color change and rash.  Neurological:  Negative for seizures and syncope.  All other systems reviewed and are negative.   Physical Exam Updated Vital Signs BP (!) 156/123   Pulse 69   Temp 98.9 F (37.2 C) (Oral)   Resp 18   Ht 5' (1.524 m)   LMP 01/11/2024   SpO2 95%   Breastfeeding No   BMI 39.06 kg/m  Physical Exam Vitals and nursing note reviewed.  Constitutional:      General: She is not in acute distress.    Appearance: She is well-developed.  HENT:     Head: Normocephalic and atraumatic.  Eyes:     Conjunctiva/sclera: Conjunctivae normal.  Cardiovascular:     Rate and Rhythm: Normal rate and regular rhythm.     Heart  sounds: No murmur heard. Pulmonary:     Effort: Pulmonary effort is normal. No respiratory distress.     Breath sounds: Normal breath sounds.  Abdominal:     General: There is no distension.     Palpations: Abdomen is soft.     Tenderness: There is abdominal tenderness. There is no right CVA tenderness or left CVA tenderness.  Musculoskeletal:        General: No swelling or tenderness. Normal range of motion.     Cervical back: Neck supple.  Skin:    General: Skin is warm and dry.  Neurological:     General: No focal deficit present.     Mental Status: She is alert and oriented to person, place, and time. Mental status is at baseline.     Cranial Nerves: No cranial nerve deficit.      ED Course/ Medical Decision Making/ A&P    Procedures Procedures   Medications Ordered in ED Medications  promethazine (PHENERGAN) 12.5 mg in sodium chloride 0.9 % 50 mL IVPB (0 mg Intravenous Stopped 02/05/24 1350)  ondansetron (ZOFRAN) injection 4 mg (4 mg Intravenous Given 02/05/24 1220)  lactated ringers bolus 1,000 mL (0 mLs Intravenous Stopped 02/05/24 1450)  iohexol (OMNIPAQUE) 300 MG/ML solution 100 mL (100 mLs Intravenous Contrast Given 02/05/24 1358)  droperidol (INAPSINE) 2.5 MG/ML injection 2.5 mg (2.5 mg Intravenous Given 02/05/24 1504)   Medical Decision Making:   Frances Mccormick  is a 38 y.o. female who presented to the ED today with abdominal pain, detailed above.    Additional history discussed with patient's family/caregivers.  Patient placed on continuous vitals and telemetry monitoring while in ED which was reviewed periodically.  Complete initial physical exam performed, notably the patient  was HDS in NAD.     Reviewed and confirmed nursing documentation for past medical history, family history, social history.    Initial Assessment:   With the patient's presentation of abdominal pain, most likely diagnosis is nonspecific cause. Other diagnoses were considered including (but not  limited to) gastroenteritis, colitis, small bowel obstruction, appendicitis, cholecystitis, pancreatitis, nephrolithiasis, UTI, pyleonephritis, ruptured ectopic pregnancy, PID, ovarianr torsion. These are considered less likely due to history of present illness and physical exam findings.   This is most consistent with an acute life/limb threatening illness complicated by underlying chronic conditions.   Initial Plan:  CBC/CMP to evaluate for underlying infectious/metabolic etiology for patient's abdominal pain  Lipase to evaluate for pancreatitis  EKG to evaluate for cardiac source of pain  Urinalysis and repeat physical assessment to evaluate for UTI/Pyelonpehritis  Empiric management of symptoms with escalating pain control and antiemetics as needed.  I considered a CT abdomen pelvis with contrast for further evaluation, however patient has had 1 within the last 4 months for the same exact symptoms. It was nondiagnostic for any acute pathology it did not change her management. Ultimately I am not fully convinced t recess Marlys thank you patient just hat this is a Crohn's flare given prior presentations and prior imaging reports. She seems to have more chronic disease identified on past studies though there was some small enteritis in the ileum. Interestingly I would expect her to have diarrhea involvement if this was from an acute colitis. Query if some of her symptoms are from a cyclic vomiting syndrome as it seems only involve her upper GI tract and cramping sensation. Will start with blood work as above.  Blood work does have more  Initial Study Results:   Laboratory  All laboratory results reviewed without evidence of clinically relevant pathology.    EKG EKG was reviewed independently. Rate, rhythm, axis, intervals all examined and without medically relevant abnormality. ST segments without concerns for elevations.    Radiology All images reviewed independently. Agree with radiology  report at this time.   No results found.  Reassessment and Plan:   Personally reviewed CT abdomen pelvis.  Does not show any acute changes from prior on my wet read, however formal read is still pending.  I favor that this patient's presentation is more consistent with cyclic vomiting syndrome given the resolution with droperidol, presence of marijuana in her urine and overall improving blood work. Would ensure no appendicitis, cholecystitis or other acute etiology of her symptoms therefore we will wait on for CT abdomen pelvis read per radiology.   Care of patient handed off to oncoming provider pending this final result.  Clinical Impression:  1. Generalized abdominal pain   2. Cyclic vomiting syndrome      Data Unavailable   Final Clinical Impression(s) / ED Diagnoses Final diagnoses:  Generalized abdominal pain  Cyclic vomiting syndrome    Rx / DC Orders ED Discharge Orders          Ordered    promethazine (PHENERGAN) 25 MG suppository  Every 6 hours PRN        02/05/24 1637    ondansetron (ZOFRAN) 4 MG tablet  Every 6 hours  02/05/24 1637              Glyn Ade, MD 02/05/24 [phone]

## 2024-02-05 NOTE — ED Triage Notes (Signed)
 Patient presented to ER for abdominal pain. Patient states she has crohn's and this feels like a Crohn's flare.

## 2024-02-05 NOTE — Discharge Instructions (Addendum)
 You were seen today for abdominal pain.  As we discussed is likely secondary to cyclic vomiting syndrome. I recommend that you follow-up with your gastroenterologist for further management of your chronic Crohn's disease and supportive care.

## 2024-02-05 NOTE — ED Provider Notes (Signed)
 Patient signed out to me by previous provider. Please refer to their note for full HPI.  Briefly this is an 38 year old female past medical history of Crohn's who came to the emergency department complaining of abdominal cramping, nausea and vomiting.  Workup is reassuring, we are pending CT of the abdomen pelvis.  If this is unremarkable plan for outpatient follow-up.  CT of the abdomen pelvis does not identify any acute pathology.  On reevaluation patient states that she feels improved, soft abdomen, stable vitals.  Requesting be discharged.  Patient at this time appears safe and stable for discharge and close outpatient follow up. Discharge plan and strict return to ED precautions discussed, patient verbalizes understanding and agreement.   Rozelle Logan, DO 02/05/24 0981

## 2024-02-07 ENCOUNTER — Encounter (HOSPITAL_BASED_OUTPATIENT_CLINIC_OR_DEPARTMENT_OTHER): Payer: Self-pay | Admitting: Emergency Medicine

## 2024-02-07 ENCOUNTER — Emergency Department (HOSPITAL_BASED_OUTPATIENT_CLINIC_OR_DEPARTMENT_OTHER)

## 2024-02-07 ENCOUNTER — Other Ambulatory Visit: Payer: Self-pay

## 2024-02-07 ENCOUNTER — Emergency Department (HOSPITAL_BASED_OUTPATIENT_CLINIC_OR_DEPARTMENT_OTHER)
Admission: EM | Admit: 2024-02-07 | Discharge: 2024-02-07 | Disposition: A | Discharge status: Home / Self Care | Attending: Emergency Medicine | Admitting: Emergency Medicine

## 2024-02-07 DIAGNOSIS — R1030 Lower abdominal pain, unspecified: Secondary | ICD-10-CM | POA: Insufficient documentation

## 2024-02-07 DIAGNOSIS — I10 Essential (primary) hypertension: Secondary | ICD-10-CM | POA: Diagnosis not present

## 2024-02-07 DIAGNOSIS — R112 Nausea with vomiting, unspecified: Secondary | ICD-10-CM | POA: Insufficient documentation

## 2024-02-07 DIAGNOSIS — K5090 Crohn's disease, unspecified, without complications: Secondary | ICD-10-CM | POA: Diagnosis not present

## 2024-02-07 DIAGNOSIS — R109 Unspecified abdominal pain: Secondary | ICD-10-CM

## 2024-02-07 DIAGNOSIS — D252 Subserosal leiomyoma of uterus: Secondary | ICD-10-CM | POA: Diagnosis not present

## 2024-02-07 LAB — COMPREHENSIVE METABOLIC PANEL
ALT: 17 U/L (ref 0–44)
AST: 31 U/L (ref 15–41)
Albumin: 3.9 g/dL (ref 3.5–5.0)
Alkaline Phosphatase: 57 U/L (ref 38–126)
Anion gap: 11 (ref 5–15)
BUN: 17 mg/dL (ref 6–20)
CO2: 25 mmol/L (ref 22–32)
Calcium: 8.9 mg/dL (ref 8.9–10.3)
Chloride: 98 mmol/L (ref 98–111)
Creatinine, Ser: 1.09 mg/dL — ABNORMAL HIGH (ref 0.44–1.00)
GFR, Estimated: >60 mL/min (ref >60)
Glucose, Bld: 113 mg/dL — ABNORMAL HIGH (ref 70–99)
Potassium: 2.7 mmol/L — CL (ref 3.5–5.1)
Sodium: 134 mmol/L — ABNORMAL LOW (ref 135–145)
Total Bilirubin: 1.1 mg/dL (ref 0.0–1.2)
Total Protein: 7.9 g/dL (ref 6.5–8.1)

## 2024-02-07 LAB — CBC WITH DIFFERENTIAL/PLATELET
Abs Immature Granulocytes: 0.13 10*3/uL — ABNORMAL HIGH (ref 0.00–0.07)
Basophils Absolute: 0.1 10*3/uL (ref 0.0–0.1)
Basophils Relative: 0 %
Eosinophils Absolute: 0.9 10*3/uL — ABNORMAL HIGH (ref 0.0–0.5)
Eosinophils Relative: 4 %
HCT: 49.2 % — ABNORMAL HIGH (ref 36.0–46.0)
Hemoglobin: 16.4 g/dL — ABNORMAL HIGH (ref 12.0–15.0)
Immature Granulocytes: 1 %
Lymphocytes Relative: 13 %
Lymphs Abs: 2.8 10*3/uL (ref 0.7–4.0)
MCH: 26.3 pg (ref 26.0–34.0)
MCHC: 33.3 g/dL (ref 30.0–36.0)
MCV: 79 fL — ABNORMAL LOW (ref 80.0–100.0)
Monocytes Absolute: 1.4 10*3/uL — ABNORMAL HIGH (ref 0.1–1.0)
Monocytes Relative: 7 %
Neutro Abs: 15.7 10*3/uL — ABNORMAL HIGH (ref 1.7–7.7)
Neutrophils Relative %: 75 %
Platelets: 329 10*3/uL (ref 150–400)
RBC: 6.23 MIL/uL — ABNORMAL HIGH (ref 3.87–5.11)
RDW: 16.4 % — ABNORMAL HIGH (ref 11.5–15.5)
Smear Review: NORMAL
WBC: 21 10*3/uL — ABNORMAL HIGH (ref 4.0–10.5)
nRBC: 0 % (ref 0.0–0.2)

## 2024-02-07 LAB — MAGNESIUM: Magnesium: 2.2 mg/dL (ref 1.7–2.4)

## 2024-02-07 LAB — URINALYSIS, W/ REFLEX TO CULTURE (INFECTION SUSPECTED)
Bilirubin Urine: NEGATIVE
Glucose, UA: NEGATIVE mg/dL
Ketones, ur: 40 mg/dL — AB
Leukocytes,Ua: NEGATIVE
Nitrite: NEGATIVE
Protein, ur: 100 mg/dL — AB
Specific Gravity, Urine: 1.02 (ref 1.005–1.030)
pH: 6.5 (ref 5.0–8.0)

## 2024-02-07 LAB — LACTIC ACID, PLASMA: Lactic Acid, Venous: 1.2 mmol/L (ref 0.5–1.9)

## 2024-02-07 LAB — PREGNANCY, URINE: Preg Test, Ur: NEGATIVE

## 2024-02-07 LAB — LIPASE, BLOOD: Lipase: 37 U/L (ref 11–51)

## 2024-02-07 MED ORDER — LACTATED RINGERS IV BOLUS
1000.0000 mL | Freq: Once | INTRAVENOUS | Status: AC
  Administered 2024-02-07: 1000 mL via INTRAVENOUS

## 2024-02-07 MED ORDER — IOHEXOL 300 MG/ML  SOLN
100.0000 mL | Freq: Once | INTRAMUSCULAR | Status: AC | PRN
  Administered 2024-02-07: 100 mL via INTRAVENOUS

## 2024-02-07 MED ORDER — POTASSIUM CHLORIDE 10 MEQ/100ML IV SOLN
10.0000 meq | Freq: Once | INTRAVENOUS | Status: AC
  Administered 2024-02-07: 10 meq via INTRAVENOUS
  Filled 2024-02-07: qty 100

## 2024-02-07 MED ORDER — ONDANSETRON HCL 4 MG/2ML IJ SOLN
4.0000 mg | Freq: Once | INTRAMUSCULAR | Status: AC
  Administered 2024-02-07: 4 mg via INTRAVENOUS
  Filled 2024-02-07: qty 2

## 2024-02-07 MED ORDER — POTASSIUM CHLORIDE CRYS ER 20 MEQ PO TBCR: 20.0000 meq | EXTENDED_RELEASE_TABLET | Freq: Once | ORAL | 0 refills | Status: DC

## 2024-02-07 MED ORDER — POTASSIUM CHLORIDE CRYS ER 20 MEQ PO TBCR
40.0000 meq | EXTENDED_RELEASE_TABLET | Freq: Once | ORAL | Status: AC
  Administered 2024-02-07: 40 meq via ORAL
  Filled 2024-02-07: qty 2

## 2024-02-07 MED ORDER — HALOPERIDOL LACTATE 5 MG/ML IJ SOLN
3.0000 mg | Freq: Once | INTRAMUSCULAR | Status: AC
  Administered 2024-02-07: 3 mg via INTRAVENOUS
  Filled 2024-02-07: qty 1

## 2024-02-07 NOTE — ED Notes (Signed)
 Pt had positive cannaboid [facility] few days ago. Pt advised zofran is not working at home. Wants something else.

## 2024-02-07 NOTE — ED Provider Notes (Signed)
 [place] EMERGENCY DEPARTMENT AT [facility] Provider Note   CSN: [id number] Arrival date & time: 02/07/24  1114     History  Chief Complaint  Patient presents with   Emesis    Frances Mccormick is a 38 y.o. female.  HPI     38 year old female with a history of Crohn's disease, morbid obesity, hypertension, hidradenitis suppurativa, recent ED visit 3/7 for abdominal pain who presents with concern for abdominal pain, nausea and vomiting.   CT 3/7 with no acute localizing process in the abdomen or pelvis, small hiatal hernia, small fat-containing umbilical hernia, ventral hernia at the umbilicus containing nondilated bowel.  Since she returned home from the emergency department she has been taking these Zofran and, Bentyl, and used Phenergan saw suppositories with continued nausea and vomiting.  She has been unable to keep anything down.  Reports she vomited between 10-12 times yesterday.  She has had continued emesis today.  She has lower abdominal pain.  She has not had a bowel movement since Thursday.  Initially reports she has not passed flatus but then reports she did earlier this morning once.  She has had difficulty urinating and decrease in urination.  Denies fever, dysuria, vaginal discharge or bleeding, cough, shortness of breath, chest pain.  She had previously been on steroids for her colitis flare but had unwanted side effects and has been off of them for a long time now.  Her last flare was in November.  Reports she has stopped using marijuana as the physician had explained that it can cause nausea and vomiting.   Past Medical History:  Diagnosis Date   Advanced maternal age in multigravida, second trimester 04/25/2022   Chronic hypertension with superimposed preeclampsia 08/11/2018   Crohn's colitis (HCC)    Hidradenitis suppurativa    Hypertension    Hyperthyroidism affecting pregnancy in first trimester 01/12/2022   Repeat TFTs in 2nd trimester      Migraine    "a few/year" (07/27/2018)   Non-compliant patient    Preterm delivery after induction of labor 06/20/2022   Preterm premature rupture of membranes (PPROM) with onset of labor within 24 hours of rupture in third trimester, antepartum 06/19/2022   Supervision of high risk pregnancy, antepartum 12/16/2021              Nursing Staff    Provider      Office Location     [facility]    Dating     LMP,  9 week [place]      [facility] Model    Arly.Keller ] Traditional  [ ]  Centering  [ ]  Mom-Baby Dyad                Language     English    Anatomy US     Nl 19 weeks      Flu Vaccine      04/25/2022    Genetic/Carrier Screen     NIPS:   LR female  AFP:   *Screen Negative*  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 Authorizing Provider  dicyclomine (BENTYL) 20 MG tablet Take 1 tablet (20 mg total) by mouth 2 (two) times daily. 02/05/24   Horton, Clabe Seal, DO  Norethindrone Acetate-Ethinyl Estrad-FE (LOESTRIN 24 FE) 1-20 MG-MCG(24) tablet Take 1 tablet by  mouth daily. Patient not taking: Reported on 10/24/2023 06/15/23   Corlis Hove, NP  ondansetron (ZOFRAN) 4 MG tablet Take 1 tablet (4 mg total) by mouth every 6 (six) hours. 02/05/24   Horton, Clabe Seal, DO  ondansetron (ZOFRAN-ODT) 4 MG disintegrating tablet Take 1 tablet (4 mg total) by mouth every 8 (eight) hours as needed for nausea or vomiting. 10/24/23   Barrett, Horald Chestnut, PA-C  ondansetron (ZOFRAN-ODT) 4 MG disintegrating tablet Take 1 tablet (4 mg total) by mouth every 8 (eight) hours as needed for nausea or vomiting. 10/26/23   Hughie Closs, MD  oxyCODONE-acetaminophen (PERCOCET/ROXICET) 5-325 MG tablet Take 1 tablet by mouth every 6 (six) hours as needed for up to 8 doses for severe pain (pain score 7-10). 10/27/23   Gareth Eagle, PA-C  predniSONE (DELTASONE) 20 MG tablet Take 2 tablets (40 mg total) by mouth daily. 10/24/23   Barrett, Horald Chestnut,  PA-C  promethazine (PHENERGAN) 25 MG suppository Place 1 suppository (25 mg total) rectally every 6 (six) hours as needed for nausea or vomiting. 02/05/24   Horton, Clabe Seal, DO      Allergies    Bee venom and Ciprofloxacin    Review of Systems   Review of Systems  Physical Exam Updated Vital Signs BP (!) 157/106 (BP Location: Right Arm)   Pulse (!) 110   Temp 99.3 F (37.4 C) (Oral)   Resp 14   Wt 95.3 kg   LMP 01/11/2024   SpO2 100%   BMI 41.01 kg/m  Physical Exam Vitals and nursing note reviewed.  Constitutional:      General: She is not in acute distress.    Appearance: She is well-developed. She is not diaphoretic.  HENT:     Head: Normocephalic and atraumatic.  Eyes:     Conjunctiva/sclera: Conjunctivae normal.  Cardiovascular:     Rate and Rhythm: Normal rate and regular rhythm.     Heart sounds: Normal heart sounds. No murmur heard.    No friction rub. No gallop.  Pulmonary:     Effort: Pulmonary effort is normal. No respiratory distress.     Breath sounds: Normal breath sounds. No wheezing or rales.  Abdominal:     General: There is no distension.     Palpations: Abdomen is soft.     Tenderness: There is abdominal tenderness (lower abdomen). There is no guarding.  Musculoskeletal:        General: No tenderness.     Cervical back: Normal range of motion.  Skin:    General: Skin is warm and dry.     Findings: No erythema or rash.  Neurological:     Mental Status: She is alert and oriented to person, place, and time.     ED Results / Procedures / Treatments   Labs (all labs ordered are listed, but only abnormal results are displayed) Labs Reviewed  CBC WITH DIFFERENTIAL/PLATELET - Abnormal; Notable for the following components:      Result Value   WBC 21.0 (*)    RBC 6.23 (*)    Hemoglobin 16.4 (*)    HCT 49.2 (*)    MCV 79.0 (*)    RDW 16.4 (*)    Neutro Abs 15.7 (*)    Monocytes Absolute 1.4 (*)    Eosinophils Absolute 0.9 (*)    Abs Immature  Granulocytes 0.13 (*)    All other components within normal limits  URINALYSIS, W/ REFLEX TO CULTURE (INFECTION SUSPECTED) - Abnormal; Notable for the  following components:   APPearance HAZY (*)    Hgb urine dipstick SMALL (*)    Ketones, ur 40 (*)    Protein, ur 100 (*)    Bacteria, UA FEW (*)    All other components within normal limits  LACTIC ACID, PLASMA  PREGNANCY, URINE  COMPREHENSIVE METABOLIC PANEL  LIPASE, BLOOD    EKG None  Radiology No results found.   Procedures .Ultrasound ED Peripheral IV (Provider)  Date/Time: 02/07/2024 1:05 PM  Performed by: Alvira Monday, MD Authorized by: Alvira Monday, MD   Procedure details:    Indications: hydration and multiple failed IV attempts     Skin Prep: chlorhexidine gluconate     Location:  Right AC   Angiocath:  20 G   Bedside Ultrasound Guided: Yes     Images: not archived     Patient tolerated procedure without complications: Yes     Dressing applied: Yes       Medications Ordered in ED Medications  lactated ringers bolus 1,000 mL (0 mLs Intravenous Stopped 02/07/24 1404)  haloperidol lactate (HALDOL) injection 3 mg (3 mg Intravenous Given 02/07/24 1303)    ED Course/ Medical Decision Making/ A&P                                   38 year old female with a history of Crohn's disease, morbid obesity, hypertension, hidradenitis suppurativa, recent ED visit 3/7 for abdominal pain who presents with concern for abdominal pain, nausea and vomiting.  DDx includes appendicitis, pancreatitis, cholecystitis, pyelonephritis, nephrolithiasis, diverticulitis, SBO, cannabinoid hyperemesis, gastroparesis, Crohn's flare, hernia with bowel obstruction, PID, ovarian torsion, ectopic pregnancy, and tuboovarian abscess.  Denies vaginal discharge and low suspicion at this time for pelvic etiology of symptoms//I do not feel that history and exam are consistent with torsion, TOA or PID.  CT 3/7 with no acute localizing process in  the abdomen or pelvis, small hiatal hernia, small fat-containing umbilical hernia, ventral hernia at the umbilicus containing nondilated bowel.  On exam today, it is difficult to palpate or localize this ventral hernia noted on CT, however given hernia containing nondilated bowel previously, with continued symptoms of nausea, vomiting and inability to tolerate p.o., decreased flatus and bowel movements will repeat CT today.  Labs completed and personally evaluated and interpreted by me show stable leukocytosis, hgb 16.4.  Lactic acid WNL.  CMP, lipase, urine pending.  CT pending at time of transfer of care.   Symptoms improving after IV fluids and haldol.          Final Clinical Impression(s) / ED Diagnoses Final diagnoses:  Nausea and vomiting, unspecified vomiting type  Abdominal pain, unspecified abdominal location    Rx / DC Orders ED Discharge Orders     None         Alvira Monday, MD 02/07/24 1523

## 2024-02-07 NOTE — Discharge Instructions (Signed)
 You have been seen and discharged from the emergency department.  Your blood work revealed low potassium.  Your CAT scan did not show any acute finding.  Continue to take potassium supplements for the next couple days until he can have outpatient blood work to reevaluate potassium level.  Follow-up with your GI doctor for further care.  You may take your home medications for nausea control.  Follow-up with your primary provider for further evaluation and further care. Take home medications as prescribed. If you have any worsening symptoms or further concerns for your health please return to an emergency department for further evaluation.

## 2024-02-07 NOTE — ED Provider Notes (Signed)
 Patient signed out to me by previous provider. Please refer to their note for full HPI.  Briefly this is a 38 year old female here with nausea and vomiting.  History of Crohn's disease.  Has been trying to control with medications but not tolerating any p.o., failed Phenergan suppositories.  Blood work reveals hypokalemia of 2.9, magnesium is normal.  EKG shows no changes.  This was replaced with IV and oral.  CT of the abdomen pelvis shows fibroid but no other acute findings.  On reevaluation patient feels well, she has been tolerating p.o. without difficulty and is requesting to be discharged home.  Patient at this time appears safe and stable for discharge and close outpatient follow up. Discharge plan and strict return to ED precautions discussed, patient verbalizes understanding and agreement.   Rozelle Logan, DO 02/07/24 [id number]

## 2024-02-07 NOTE — ED Notes (Addendum)
 Called lab for Mag add on, and per EDP verbal order, pt to PO challenge. Pt given cup of water.

## 2024-02-07 NOTE — ED Triage Notes (Signed)
 Recurrent and persistent emesis x 2 days , was seen and no relief with Zofran .

## 2024-02-07 NOTE — ED Notes (Signed)
 Patient tolerated her PO challenge of a cup of water with no nausea or vomiting.  She asked for another cup.

## 2024-03-01 ENCOUNTER — Emergency Department (HOSPITAL_BASED_OUTPATIENT_CLINIC_OR_DEPARTMENT_OTHER)
Admission: EM | Admit: 2024-03-01 | Discharge: 2024-03-01 | Disposition: A | Discharge status: Home / Self Care | Attending: Emergency Medicine | Admitting: Emergency Medicine

## 2024-03-01 ENCOUNTER — Other Ambulatory Visit: Payer: Self-pay

## 2024-03-01 ENCOUNTER — Encounter (HOSPITAL_BASED_OUTPATIENT_CLINIC_OR_DEPARTMENT_OTHER): Payer: Self-pay | Admitting: *Deleted

## 2024-03-01 DIAGNOSIS — R1013 Epigastric pain: Secondary | ICD-10-CM | POA: Diagnosis not present

## 2024-03-01 DIAGNOSIS — I10 Essential (primary) hypertension: Secondary | ICD-10-CM | POA: Diagnosis not present

## 2024-03-01 DIAGNOSIS — E876 Hypokalemia: Secondary | ICD-10-CM | POA: Insufficient documentation

## 2024-03-01 DIAGNOSIS — R1010 Upper abdominal pain, unspecified: Secondary | ICD-10-CM | POA: Diagnosis present

## 2024-03-01 DIAGNOSIS — E669 Obesity, unspecified: Secondary | ICD-10-CM | POA: Insufficient documentation

## 2024-03-01 DIAGNOSIS — E860 Dehydration: Secondary | ICD-10-CM | POA: Diagnosis not present

## 2024-03-01 DIAGNOSIS — R197 Diarrhea, unspecified: Secondary | ICD-10-CM

## 2024-03-01 LAB — COMPREHENSIVE METABOLIC PANEL WITH GFR
ALT: 17 U/L (ref 0–44)
AST: 22 U/L (ref 15–41)
Albumin: 4.4 g/dL (ref 3.5–5.0)
Alkaline Phosphatase: 71 U/L (ref 38–126)
Anion gap: 17 — ABNORMAL HIGH (ref 5–15)
BUN: 8 mg/dL (ref 6–20)
CO2: 21 mmol/L — ABNORMAL LOW (ref 22–32)
Calcium: 9.4 mg/dL (ref 8.9–10.3)
Chloride: 97 mmol/L — ABNORMAL LOW (ref 98–111)
Creatinine, Ser: 0.93 mg/dL (ref 0.44–1.00)
GFR, Estimated: >60 mL/min (ref >60)
Glucose, Bld: 135 mg/dL — ABNORMAL HIGH (ref 70–99)
Potassium: 2.8 mmol/L — ABNORMAL LOW (ref 3.5–5.1)
Sodium: 135 mmol/L (ref 135–145)
Total Bilirubin: 0.5 mg/dL (ref 0.0–1.2)
Total Protein: 9.1 g/dL — ABNORMAL HIGH (ref 6.5–8.1)

## 2024-03-01 LAB — MAGNESIUM: Magnesium: 1.9 mg/dL (ref 1.7–2.4)

## 2024-03-01 LAB — CBC
HCT: 48.7 % — ABNORMAL HIGH (ref 36.0–46.0)
Hemoglobin: 16.2 g/dL — ABNORMAL HIGH (ref 12.0–15.0)
MCH: 26.1 pg (ref 26.0–34.0)
MCHC: 33.3 g/dL (ref 30.0–36.0)
MCV: 78.5 fL — ABNORMAL LOW (ref 80.0–100.0)
Platelets: 343 10*3/uL (ref 150–400)
RBC: 6.2 MIL/uL — ABNORMAL HIGH (ref 3.87–5.11)
RDW: 15.1 % (ref 11.5–15.5)
WBC: 10.1 10*3/uL (ref 4.0–10.5)
nRBC: 0 % (ref 0.0–0.2)

## 2024-03-01 LAB — URINALYSIS, ROUTINE W REFLEX MICROSCOPIC
Glucose, UA: NEGATIVE mg/dL
Ketones, ur: >=80 mg/dL — AB
Leukocytes,Ua: NEGATIVE
Nitrite: NEGATIVE
Protein, ur: >=300 mg/dL — AB
Specific Gravity, Urine: >=1.03 (ref 1.005–1.030)
pH: 6.5 (ref 5.0–8.0)

## 2024-03-01 LAB — URINALYSIS, MICROSCOPIC (REFLEX)

## 2024-03-01 LAB — LIPASE, BLOOD: Lipase: 21 U/L (ref 11–51)

## 2024-03-01 LAB — PREGNANCY, URINE: Preg Test, Ur: NEGATIVE

## 2024-03-01 MED ORDER — MAGNESIUM SULFATE 2 GM/50ML IV SOLN
2.0000 g | Freq: Once | INTRAVENOUS | Status: AC
  Administered 2024-03-01: 2 g via INTRAVENOUS
  Filled 2024-03-01: qty 50

## 2024-03-01 MED ORDER — ONDANSETRON HCL 4 MG/2ML IJ SOLN
4.0000 mg | Freq: Once | INTRAMUSCULAR | Status: AC
  Administered 2024-03-01: 4 mg via INTRAVENOUS
  Filled 2024-03-01: qty 2

## 2024-03-01 MED ORDER — SODIUM CHLORIDE 0.9 % IV BOLUS
1000.0000 mL | Freq: Once | INTRAVENOUS | Status: AC
  Administered 2024-03-01: 1000 mL via INTRAVENOUS

## 2024-03-01 MED ORDER — POTASSIUM CHLORIDE 10 MEQ/100ML IV SOLN
10.0000 meq | INTRAVENOUS | Status: AC
  Administered 2024-03-01 (×3): 10 meq via INTRAVENOUS
  Filled 2024-03-01 (×3): qty 100

## 2024-03-01 NOTE — Discharge Instructions (Signed)
 Potassium-Rich Foods:  Bananas, Potatoes, Sweet potatoes, Avocado, Spinach, Tomatoes, White beans, and Dark chocolate. Magnesium-Rich Foods:  Almonds, Cashews, Peanuts, Tofu, Whole grains, Fatty fish (e.g., salmon), Broccoli, and Pumpkin seeds. Foods High in Both Potassium and Magnesium:  Edamame, Black beans, Lentils, Dried apricots, Swiss chard, Nuts, and Seeds.

## 2024-03-01 NOTE — ED Notes (Signed)
 Labeled urine cup given and pt advised that urine sample is needed

## 2024-03-01 NOTE — ED Triage Notes (Signed)
 Pt arrives POV for abdominal pain with radiation into back.  Pt has had nausea and vomiting with this.  She states that she thinks it might be a flare up of her Chrohns, no rectal bleeding.

## 2024-03-01 NOTE — ED Provider Notes (Signed)
 [place] [facility] Provider Note   CSN: [id number] Arrival date & time: 03/01/24  4098     History  Chief Complaint  Patient presents with   Abdominal Pain    Frances Mccormick is a 38 y.o. female.  Pt is a 38 yo female with pmhx significant for Crohn's disease, HTN, and hidradenitis suppurativa.  Pt said she developed n/v/d for a few days.  Nausea is better with suppositories at home, but she still has diarrhea.  She has some upper abd pain.  She is not sure if it's a Crohn's flare or not.  She has not seen any rectal bleeding.  Pt did have 2 CT abd/pelvis scans in March for abd pain which were nl.         Home Medications Prior to Admission medications   Medication Sig Start Date End Date Taking? Authorizing Provider  dicyclomine (BENTYL) 20 MG tablet Take 1 tablet (20 mg total) by mouth 2 (two) times daily. 02/05/24   Horton, Clabe Seal, DO  Norethindrone Acetate-Ethinyl Estrad-FE (LOESTRIN 24 FE) 1-20 MG-MCG(24) tablet Take 1 tablet by mouth daily. Patient not taking: Reported on 10/24/2023 06/15/23   Corlis Hove, NP  ondansetron (ZOFRAN) 4 MG tablet Take 1 tablet (4 mg total) by mouth every 6 (six) hours. 02/05/24   Horton, Clabe Seal, DO  ondansetron (ZOFRAN-ODT) 4 MG disintegrating tablet Take 1 tablet (4 mg total) by mouth every 8 (eight) hours as needed for nausea or vomiting. 10/24/23   Barrett, Horald Chestnut, PA-C  ondansetron (ZOFRAN-ODT) 4 MG disintegrating tablet Take 1 tablet (4 mg total) by mouth every 8 (eight) hours as needed for nausea or vomiting. 10/26/23   Hughie Closs, MD  oxyCODONE-acetaminophen (PERCOCET/ROXICET) 5-325 MG tablet Take 1 tablet by mouth every 6 (six) hours as needed for up to 8 doses for severe pain (pain score 7-10). 10/27/23   Gareth Eagle, PA-C  potassium chloride SA (KLOR-CON M) 20 MEQ tablet Take 1 tablet (20 mEq total) by mouth once for 1 dose. 02/07/24 02/07/24  Horton, Clabe Seal, DO  predniSONE (DELTASONE)  20 MG tablet Take 2 tablets (40 mg total) by mouth daily. 10/24/23   Barrett, Horald Chestnut, PA-C  promethazine (PHENERGAN) 25 MG suppository Place 1 suppository (25 mg total) rectally every 6 (six) hours as needed for nausea or vomiting. 02/05/24   Horton, Clabe Seal, DO      Allergies    Bee venom and Ciprofloxacin    Review of Systems   Review of Systems  Gastrointestinal:  Positive for abdominal pain, diarrhea, nausea and vomiting.  All other systems reviewed and are negative.   Physical Exam Updated Vital Signs BP 129/72   Pulse 62   Temp 98.2 F (36.8 C) (Oral)   Resp (!) 22   LMP 02/28/2024 (Exact Date)   SpO2 100%  Physical Exam Vitals and nursing note reviewed.  Constitutional:      Appearance: She is well-developed. She is obese.  HENT:     Head: Normocephalic and atraumatic.     Mouth/Throat:     Mouth: Mucous membranes are dry.     Pharynx: Oropharynx is clear.  Eyes:     Extraocular Movements: Extraocular movements intact.     Pupils: Pupils are equal, round, and reactive to light.  Cardiovascular:     Rate and Rhythm: Normal rate and regular rhythm.     Heart sounds: Normal heart sounds.  Pulmonary:     Effort:  Pulmonary effort is normal.     Breath sounds: Normal breath sounds.  Abdominal:     General: Abdomen is flat.     Palpations: Abdomen is soft.     Tenderness: There is abdominal tenderness in the epigastric area.  Skin:    General: Skin is warm.     Capillary Refill: Capillary refill takes less than 2 seconds.  Neurological:     General: No focal deficit present.     Mental Status: She is alert and oriented to person, place, and time.  Psychiatric:        Mood and Affect: Mood normal.        Behavior: Behavior normal.     ED Results / Procedures / Treatments   Labs (all labs ordered are listed, but only abnormal results are displayed) Labs Reviewed  COMPREHENSIVE METABOLIC PANEL WITH GFR - Abnormal; Notable for the following components:       Result Value   Potassium 2.8 (*)    Chloride 97 (*)    CO2 21 (*)    Glucose, Bld 135 (*)    Total Protein 9.1 (*)    Anion gap 17 (*)    All other components within normal limits  CBC - Abnormal; Notable for the following components:   RBC 6.20 (*)    Hemoglobin 16.2 (*)    HCT 48.7 (*)    MCV 78.5 (*)    All other components within normal limits  URINALYSIS, ROUTINE W REFLEX MICROSCOPIC - Abnormal; Notable for the following components:   APPearance CLOUDY (*)    Hgb urine dipstick LARGE (*)    Bilirubin Urine SMALL (*)    Ketones, ur >=80 (*)    Protein, ur >=300 (*)    All other components within normal limits  URINALYSIS, MICROSCOPIC (REFLEX) - Abnormal; Notable for the following components:   Bacteria, UA RARE (*)    All other components within normal limits  GASTROINTESTINAL PANEL BY PCR, STOOL (REPLACES STOOL CULTURE)  C DIFFICILE QUICK SCREEN W PCR REFLEX    LIPASE, BLOOD  PREGNANCY, URINE  MAGNESIUM    EKG EKG Interpretation Date/Time:  Tuesday March 01 2024 09:47:09 EDT Ventricular Rate:  85 PR Interval:  142 QRS Duration:  72 QT Interval:  398 QTC Calculation: 473 R Axis:   40  Text Interpretation: Sinus rhythm with occasional Premature ventricular complexes Right atrial enlargement Nonspecific ST abnormality Abnormal ECG When compared with ECG of 07-Feb-2024 16:28, PREVIOUS ECG IS PRESENT PVCs are new Confirmed by Jacalyn Lefevre [phone]) on 03/01/2024 9:50:42 AM  Radiology No results found.  Procedures Procedures    Medications Ordered in ED Medications  potassium chloride 10 mEq in 100 mL IVPB (10 mEq Intravenous New Bag/Given 03/01/24 1353)  sodium chloride 0.9 % bolus 1,000 mL (0 mLs Intravenous Stopped 03/01/24 1452)  magnesium sulfate IVPB 2 g 50 mL (0 g Intravenous Stopped 03/01/24 1353)  ondansetron (ZOFRAN) injection 4 mg (4 mg Intravenous Given 03/01/24 1255)    ED Course/ Medical Decision Making/ A&P                                 Medical  Decision Making Amount and/or Complexity of Data Reviewed Labs: ordered.  Risk Prescription drug management.   This patient presents to the ED for concern of abd pain, n/v/d, this involves an extensive number of treatment options, and is a complaint that carries with it  a high risk of complications and morbidity.  The differential diagnosis includes gastroenteritis, dehydration, electrolyte abn   Co morbidities that complicate the patient evaluation  Crohn's disease, HTN, and hidradenitis suppurativa   Additional history obtained:  Additional history obtained from epic chart review  Lab Tests:  I Ordered, and personally interpreted labs.  The pertinent results include:  cbc nl, cmp with k low at 2.8, mg 1.9, preg neg, urine with +ketones and protein  Medicines ordered and prescription drug management:  I ordered medication including ivfs/zofran/k/mg  for sx  Reevaluation of the patient after these medicines showed that the patient improved I have reviewed the patients home medicines and have made adjustments as needed   Test Considered:  ct   Critical Interventions:  ivfs   Problem List / ED Course:  Hypokalemia:  pt given iv kcl and iv mg.   Dehydration:  improved after ivfs.  Pt now tolerating po fluids N/v/d:  likely viral.  She is afebrile and wbc nl.  No rectal bleeding.  She has had 2 recent CT scans.  I don't think we need to repeat.  Pt is stable for d/c.  Return if worse.  F/u with pcp.   Reevaluation:  After the interventions noted above, I reevaluated the patient and found that they have :improved   Social Determinants of Health:  Lives at home   Dispostion:  After consideration of the diagnostic results and the patients response to treatment, I feel that the patent would benefit from discharge with outpatient f/u.          Final Clinical Impression(s) / ED Diagnoses Final diagnoses:  Epigastric pain  Dehydration  Hypokalemia  Nausea  vomiting and diarrhea    Rx / DC Orders ED Discharge Orders     None         Jacalyn Lefevre, MD 03/01/24 1457

## 2024-03-02 ENCOUNTER — Encounter (HOSPITAL_BASED_OUTPATIENT_CLINIC_OR_DEPARTMENT_OTHER): Payer: Self-pay

## 2024-03-02 ENCOUNTER — Emergency Department (HOSPITAL_BASED_OUTPATIENT_CLINIC_OR_DEPARTMENT_OTHER)
Admission: EM | Admit: 2024-03-02 | Discharge: 2024-03-03 | Disposition: A | Discharge status: Home / Self Care | Attending: Emergency Medicine | Admitting: Emergency Medicine

## 2024-03-02 ENCOUNTER — Other Ambulatory Visit: Payer: Self-pay

## 2024-03-02 DIAGNOSIS — I10 Essential (primary) hypertension: Secondary | ICD-10-CM | POA: Insufficient documentation

## 2024-03-02 DIAGNOSIS — R112 Nausea with vomiting, unspecified: Secondary | ICD-10-CM | POA: Diagnosis not present

## 2024-03-02 DIAGNOSIS — R197 Diarrhea, unspecified: Secondary | ICD-10-CM | POA: Diagnosis not present

## 2024-03-02 DIAGNOSIS — R1084 Generalized abdominal pain: Secondary | ICD-10-CM | POA: Diagnosis not present

## 2024-03-02 DIAGNOSIS — R109 Unspecified abdominal pain: Secondary | ICD-10-CM | POA: Diagnosis present

## 2024-03-02 LAB — COMPREHENSIVE METABOLIC PANEL WITH GFR
ALT: 18 U/L (ref 0–44)
AST: 23 U/L (ref 15–41)
Albumin: 4.7 g/dL (ref 3.5–5.0)
Alkaline Phosphatase: 69 U/L (ref 38–126)
Anion gap: 17 — ABNORMAL HIGH (ref 5–15)
BUN: 17 mg/dL (ref 6–20)
CO2: 22 mmol/L (ref 22–32)
Calcium: 9.6 mg/dL (ref 8.9–10.3)
Chloride: 99 mmol/L (ref 98–111)
Creatinine, Ser: 1.14 mg/dL — ABNORMAL HIGH (ref 0.44–1.00)
GFR, Estimated: >60 mL/min (ref >60)
Glucose, Bld: 131 mg/dL — ABNORMAL HIGH (ref 70–99)
Potassium: 3.3 mmol/L — ABNORMAL LOW (ref 3.5–5.1)
Sodium: 138 mmol/L (ref 135–145)
Total Bilirubin: 0.6 mg/dL (ref 0.0–1.2)
Total Protein: 9.6 g/dL — ABNORMAL HIGH (ref 6.5–8.1)

## 2024-03-02 LAB — URINALYSIS, ROUTINE W REFLEX MICROSCOPIC
Bilirubin Urine: NEGATIVE
Glucose, UA: NEGATIVE mg/dL
Ketones, ur: 15 mg/dL — AB
Leukocytes,Ua: NEGATIVE
Nitrite: NEGATIVE
Protein, ur: NEGATIVE mg/dL
Specific Gravity, Urine: 1.015 (ref 1.005–1.030)
pH: 7 (ref 5.0–8.0)

## 2024-03-02 LAB — CBC
HCT: 51.5 % — ABNORMAL HIGH (ref 36.0–46.0)
Hemoglobin: 17.5 g/dL — ABNORMAL HIGH (ref 12.0–15.0)
MCH: 26.2 pg (ref 26.0–34.0)
MCHC: 34 g/dL (ref 30.0–36.0)
MCV: 77 fL — ABNORMAL LOW (ref 80.0–100.0)
Platelets: 416 10*3/uL — ABNORMAL HIGH (ref 150–400)
RBC: 6.69 MIL/uL — ABNORMAL HIGH (ref 3.87–5.11)
RDW: 15.6 % — ABNORMAL HIGH (ref 11.5–15.5)
WBC: 14.6 10*3/uL — ABNORMAL HIGH (ref 4.0–10.5)
nRBC: 0 % (ref 0.0–0.2)

## 2024-03-02 LAB — LIPASE, BLOOD: Lipase: 35 U/L (ref 11–51)

## 2024-03-02 LAB — CBG MONITORING, ED: Glucose-Capillary: 118 mg/dL — ABNORMAL HIGH (ref 70–99)

## 2024-03-02 LAB — URINALYSIS, MICROSCOPIC (REFLEX)

## 2024-03-02 LAB — PREGNANCY, URINE: Preg Test, Ur: NEGATIVE

## 2024-03-02 LAB — MAGNESIUM: Magnesium: 2.5 mg/dL — ABNORMAL HIGH (ref 1.7–2.4)

## 2024-03-02 MED ORDER — SODIUM CHLORIDE 0.9 % IV BOLUS
1000.0000 mL | Freq: Once | INTRAVENOUS | Status: AC
  Administered 2024-03-02: 1000 mL via INTRAVENOUS

## 2024-03-02 MED ORDER — METOCLOPRAMIDE HCL 10 MG PO TABS: 10.0000 mg | ORAL_TABLET | Freq: Three times a day (TID) | ORAL | 0 refills | Status: DC | PRN

## 2024-03-02 MED ORDER — SODIUM CHLORIDE 0.9 % IV BOLUS
1000.0000 mL | Freq: Once | INTRAVENOUS | Status: AC
Start: 2024-03-02 — End: 2024-03-02
  Administered 2024-03-02: 1000 mL via INTRAVENOUS

## 2024-03-02 MED ORDER — DIPHENHYDRAMINE HCL 50 MG/ML IJ SOLN
25.0000 mg | Freq: Once | INTRAMUSCULAR | Status: AC
  Administered 2024-03-02: 25 mg via INTRAVENOUS
  Filled 2024-03-02: qty 1

## 2024-03-02 MED ORDER — MORPHINE SULFATE (PF) 4 MG/ML IV SOLN
4.0000 mg | Freq: Once | INTRAVENOUS | Status: AC
  Administered 2024-03-02: 4 mg via INTRAVENOUS
  Filled 2024-03-02: qty 1

## 2024-03-02 MED ORDER — METOCLOPRAMIDE HCL 5 MG/ML IJ SOLN
10.0000 mg | Freq: Once | INTRAMUSCULAR | Status: AC
Start: 2024-03-02 — End: 2024-03-02
  Administered 2024-03-02: 10 mg via INTRAVENOUS
  Filled 2024-03-02: qty 2

## 2024-03-02 MED ORDER — ONDANSETRON HCL 4 MG/2ML IJ SOLN
4.0000 mg | Freq: Once | INTRAMUSCULAR | Status: AC
  Administered 2024-03-02: 4 mg via INTRAVENOUS
  Filled 2024-03-02: qty 2

## 2024-03-02 NOTE — Discharge Instructions (Signed)
 Please read and follow all provided instructions.  Your diagnoses today include:  1. Generalized abdominal pain     Tests performed today include: Complete blood cell count: White blood cell count and hemoglobin are high. Complete metabolic panel: Potassium improving but still slightly low Magnesium level was slightly high Lipase (pancreas function test): Was normal Urinalysis (urine test): No definite infection Pregnancy test (urine or blood, in women only): Was negative Vital signs. See below for your results today.   Medications prescribed:  Metoclopramide: Medication for nausea and vomiting  Take any prescribed medications only as directed.  Home care instructions:  Follow any educational materials contained in this packet.  Follow-up instructions: Please follow-up with your primary care provider tomorrow as planned for further evaluation of your symptoms.    Return instructions:  SEEK IMMEDIATE MEDICAL ATTENTION IF: The pain does not go away or becomes severe  A temperature above 101F develops  Repeated vomiting occurs (multiple episodes)  The pain becomes localized to portions of the abdomen. The right side could possibly be appendicitis. In an adult, the left lower portion of the abdomen could be colitis or diverticulitis.  Blood is being passed in stools or vomit (bright red or black tarry stools)  You develop chest pain, difficulty breathing, dizziness or fainting, or become confused, poorly responsive, or inconsolable (young children) If you have any other emergent concerns regarding your health  Additional Information: Abdominal (belly) pain can be caused by many things. Your caregiver performed an examination and possibly ordered blood/urine tests and imaging (CT scan, x-rays, ultrasound). Many cases can be observed and treated at home after initial evaluation in the emergency department. Even though you are being discharged home, abdominal pain can be unpredictable.  Therefore, you need a repeated exam if your pain does not resolve, returns, or worsens. Most patients with abdominal pain don't have to be admitted to the hospital or have surgery, but serious problems like appendicitis and gallbladder attacks can start out as nonspecific pain. Many abdominal conditions cannot be diagnosed in one visit, so follow-up evaluations are very important.  Your vital signs today were: BP (!) 139/94 (BP Location: Right Arm)   Pulse 69   Temp 98.9 F (37.2 C) (Oral)   Resp 20   LMP 02/28/2024 (Exact Date)   SpO2 100%  If your blood pressure (bp) was elevated above 135/85 this visit, please have this repeated by your doctor within one month. --------------

## 2024-03-02 NOTE — ED Triage Notes (Addendum)
 Pt brought in by family. Feeling better until 1 pm today. Began vomiting again with abdominal pain. Reports weakness and difficulty holding self up Used suppository at home. Pt leaning forward and not wanting to hold head upright while in triage

## 2024-03-02 NOTE — ED Notes (Signed)
 Fluid challenge complete pt. Denies vomiting but reports some nausea still

## 2024-03-02 NOTE — ED Provider Notes (Signed)
 [place] [facility] Provider Note   CSN: [id number] Arrival date & time: 03/02/24  1810     History  Chief Complaint  Patient presents with   Weakness    Frances Mccormick is a 38 y.o. female.  Patient with history of Crohn's disease (not on any current controller medications), hidradenitis, hypertension -- presents emergency department today for evaluation of nausea, vomiting, diarrhea and abdominal pain.  Patient was seen in the emergency department yesterday.  Frances Mccormick had low potassium and was hydrated.  Frances Mccormick felt better.  Symptoms recurred after returning home.  However Frances Mccormick states that Frances Mccormick is not having as much diarrhea now.  Frances Mccormick was trying Phenergan suppositories and ODT Zofran without improvement.  Frances Mccormick has some pain around the supraumbilical area of her abdomen.  This is typical pain for her when Frances Mccormick gets these flares.  Frances Mccormick has been unable to tolerate fluids.  Frances Mccormick has not urinated this afternoon.  No fevers.  Patient with 2 CT scans in March which were negative.       Home Medications Prior to Admission medications   Medication Sig Start Date End Date Taking? Authorizing Provider  dicyclomine (BENTYL) 20 MG tablet Take 1 tablet (20 mg total) by mouth 2 (two) times daily. 02/05/24   Horton, Clabe Seal, DO  Norethindrone Acetate-Ethinyl Estrad-FE (LOESTRIN 24 FE) 1-20 MG-MCG(24) tablet Take 1 tablet by mouth daily. Patient not taking: Reported on 10/24/2023 06/15/23   Corlis Hove, NP  ondansetron (ZOFRAN) 4 MG tablet Take 1 tablet (4 mg total) by mouth every 6 (six) hours. 02/05/24   Horton, Clabe Seal, DO  ondansetron (ZOFRAN-ODT) 4 MG disintegrating tablet Take 1 tablet (4 mg total) by mouth every 8 (eight) hours as needed for nausea or vomiting. 10/24/23   Barrett, Horald Chestnut, PA-C  ondansetron (ZOFRAN-ODT) 4 MG disintegrating tablet Take 1 tablet (4 mg total) by mouth every 8 (eight) hours as needed for nausea or vomiting. 10/26/23   Hughie Closs, MD   oxyCODONE-acetaminophen (PERCOCET/ROXICET) 5-325 MG tablet Take 1 tablet by mouth every 6 (six) hours as needed for up to 8 doses for severe pain (pain score 7-10). 10/27/23   Gareth Eagle, PA-C  potassium chloride SA (KLOR-CON M) 20 MEQ tablet Take 1 tablet (20 mEq total) by mouth once for 1 dose. 02/07/24 02/07/24  Horton, Clabe Seal, DO  predniSONE (DELTASONE) 20 MG tablet Take 2 tablets (40 mg total) by mouth daily. 10/24/23   Barrett, Horald Chestnut, PA-C  promethazine (PHENERGAN) 25 MG suppository Place 1 suppository (25 mg total) rectally every 6 (six) hours as needed for nausea or vomiting. 02/05/24   Horton, Clabe Seal, DO      Allergies    Bee venom and Ciprofloxacin    Review of Systems   Review of Systems  Physical Exam Updated Vital Signs BP (!) 173/118   Pulse (!) 109   Temp 99.1 F (37.3 C)   Resp 16   LMP 02/28/2024 (Exact Date)   SpO2 96%  Physical Exam Vitals and nursing note reviewed.  Constitutional:      General: Frances Mccormick is not in acute distress.    Appearance: Frances Mccormick is well-developed.  HENT:     Head: Normocephalic and atraumatic.     Right Ear: External ear normal.     Left Ear: External ear normal.     Nose: Nose normal.     Mouth/Throat:     Mouth: Mucous membranes are dry.  Eyes:  Conjunctiva/sclera: Conjunctivae normal.  Cardiovascular:     Rate and Rhythm: Regular rhythm. Tachycardia present.     Heart sounds: No murmur heard. Pulmonary:     Effort: No respiratory distress.     Breath sounds: No wheezing, rhonchi or rales.  Abdominal:     Palpations: Abdomen is soft.     Tenderness: There is abdominal tenderness. There is no guarding or rebound.     Comments: Mild supraumbilical tenderness  Musculoskeletal:     Cervical back: Normal range of motion and neck supple.     Right lower leg: No edema.     Left lower leg: No edema.  Skin:    General: Skin is warm and dry.     Findings: No rash.  Neurological:     General: No focal deficit present.      Mental Status: Frances Mccormick is alert. Mental status is at baseline.     Motor: No weakness.  Psychiatric:        Mood and Affect: Mood normal.     ED Results / Procedures / Treatments   Labs (all labs ordered are listed, but only abnormal results are displayed) Labs Reviewed  COMPREHENSIVE METABOLIC PANEL WITH GFR - Abnormal; Notable for the following components:      Result Value   Potassium 3.3 (*)    Glucose, Bld 131 (*)    Creatinine, Ser 1.14 (*)    Total Protein 9.6 (*)    Anion gap 17 (*)    All other components within normal limits  CBC - Abnormal; Notable for the following components:   WBC 14.6 (*)    RBC 6.69 (*)    Hemoglobin 17.5 (*)    HCT 51.5 (*)    MCV 77.0 (*)    RDW 15.6 (*)    Platelets 416 (*)    All other components within normal limits  URINALYSIS, ROUTINE W REFLEX MICROSCOPIC - Abnormal; Notable for the following components:   Hgb urine dipstick LARGE (*)    Ketones, ur 15 (*)    All other components within normal limits  MAGNESIUM - Abnormal; Notable for the following components:   Magnesium 2.5 (*)    All other components within normal limits  URINALYSIS, MICROSCOPIC (REFLEX) - Abnormal; Notable for the following components:   Bacteria, UA RARE (*)    All other components within normal limits  CBG MONITORING, ED - Abnormal; Notable for the following components:   Glucose-Capillary 118 (*)    All other components within normal limits  LIPASE, BLOOD  PREGNANCY, URINE    EKG None  Radiology No results found.  Procedures Procedures    Medications Ordered in ED Medications  metoCLOPramide (REGLAN) injection 10 mg (has no administration in time range)  diphenhydrAMINE (BENADRYL) injection 25 mg (has no administration in time range)  sodium chloride 0.9 % bolus 1,000 mL (0 mLs Intravenous Stopped 03/02/24 2036)  morphine (PF) 4 MG/ML injection 4 mg (4 mg Intravenous Given 03/02/24 1919)  ondansetron (ZOFRAN) injection 4 mg (4 mg Intravenous Given  03/02/24 1919)  sodium chloride 0.9 % bolus 1,000 mL (0 mLs Intravenous Stopped 03/02/24 2155)  morphine (PF) 4 MG/ML injection 4 mg (4 mg Intravenous Given 03/02/24 2152)    ED Course/ Medical Decision Making/ A&P    Patient seen and examined. History obtained directly from patient.   Labs/EKG: Ordered CBC, CMP, lipase, UA, pregnancy, and magnesium.  Imaging: None ordered  Medications/Fluids: Ordered: IV Zofran, IV morphine, IV fluid bolus.  Most recent vital signs reviewed and are as follows: BP (!) 173/118   Pulse (!) 109   Temp 99.1 F (37.3 C)   Resp 16   LMP 02/28/2024 (Exact Date)   SpO2 96%   Initial impression: Nausea, vomiting, history of Crohn's, possible flare, symptoms typical based on previous.  8:33 PM Reassessment performed. Patient appears improved.  Patient states that Frances Mccormick is progressing a bit.  Frances Mccormick feels like Frances Mccormick could potentially urinate.  Plan for a second liter of fluids.  Nausea improved, Frances Mccormick did sleep for a bit.  Labs personally reviewed and interpreted including: CBC with elevated white blood cell count of 14.6, elevated hemoglobin at 17.5, these are in line with patient's previous presentations for similar symptoms; CMP with mild AKI at 1.14 with normal BUN, potassium minimally low at 3.3 but improved from previous visit; lipase normal; magnesium slightly elevated at 2.5.  Reviewed pertinent lab work and imaging with patient at bedside. Questions answered.   Most current vital signs reviewed and are as follows: BP (!) 173/118   Pulse (!) 109   Temp 99.1 F (37.3 C)   Resp 16   LMP 02/28/2024 (Exact Date)   SpO2 96%   Plan: I did discussion with patient on how to proceed at this point.  In November 2024, on second ED visit, Frances Mccormick was admitted to the hospital for symptom control.  Last month Frances Mccormick had 2 visits in 48 hours and was able to go home after improvement.  We discussed admission versus trial of therapy at home.  Patient would like to try to go home.   Current plan is to give hydration, and then p.o. challenge and reassess.  10:26 PM Reassessment performed. Patient appears stable.  Frances Mccormick gave a urine earlier but excellently dropped the cup.  Frances Mccormick is going to try again.  Frances Mccormick was given an additional dose of morphine.  Frances Mccormick felt a bit nauseous after apple juice but did not vomit.  Reviewed pertinent lab work and imaging with patient at bedside. Questions answered.   Most current vital signs reviewed and are as follows: BP (!) 139/94 (BP Location: Right Arm)   Pulse 69   Temp 99.1 F (37.3 C)   Resp 20   LMP 02/28/2024 (Exact Date)   SpO2 100%   Plan: Reassess after UA.  11:28 PM Reassessment performed. Patient appears stable. No rebound or guarding on exam. Tenderness remains mild. Frances Mccormick states that Frances Mccormick would like an additional dose of nausea medicine and then discharge.  Frances Mccormick is tolerating water.  Labs personally reviewed and interpreted including: UA without compelling signs of infection.  Reviewed pertinent lab work and imaging with patient at bedside. Questions answered.   Most current vital signs reviewed and are as follows: BP (!) 139/94 (BP Location: Right Arm)   Pulse 69   Temp 98.9 F (37.2 C) (Oral)   Resp 20   LMP 02/28/2024 (Exact Date)   SpO2 100%   Plan: Reglan/Benadryl ordered prior to discharge.  Discharge to home.   Prescriptions written for: Reglan  Other home care instructions discussed: Focus on hydration, bland diet  ED return instructions discussed: The patient was urged to return to the Emergency Department immediately with worsening of current symptoms, worsening abdominal pain, persistent vomiting, blood noted in stools, fever, or any other concerns. The patient verbalized understanding.   Follow-up instructions discussed: Patient encouraged to follow-up with their PCP tomorrow as planned.  Strongly encourage follow-up with GI when able.  Medical Decision Making Amount and/or  Complexity of Data Reviewed Labs: ordered.  Risk Prescription drug management.   For this patient's complaint of abdominal pain, the following conditions were considered on the differential diagnosis: gastritis/PUD, enteritis/duodenitis, appendicitis, cholelithiasis/cholecystitis, cholangitis, pancreatitis, ruptured viscus, colitis, diverticulitis, small/large bowel obstruction, proctitis, cystitis, pyelonephritis, ureteral colic, aortic dissection, aortic aneurysm. In women, ectopic pregnancy, pelvic inflammatory disease, ovarian cysts, and tubo-ovarian abscess were also considered. Atypical chest etiologies were also considered including ACS, PE, and pneumonia.  Patient with symptoms which are consistent with her flares of nausea and vomiting, possibly also driven by underlying Crohn's disease.  Currently not on any controller medications.  Lab workup today is also consistent with previous.  Frances Mccormick typically has an elevated white blood cell count, sometimes up into the 20,000 range.  Patient has had 3 CTs in the past 6 months which were negative for showing mild Crohn's changes.  Patient was hydrated today and given antiemetics with improvement.  Frances Mccormick is tolerating oral fluids now.  Overall Frances Mccormick is feeling better.  Offered admission for symptom control versus discharge to home with continued outpatient treatment.  Frances Mccormick would like to go home tonight.  A month ago Frances Mccormick did have 2 ED visits and did do better without needing to be admitted.  Frances Mccormick seems reliable to return with worsening.  The patient's vital signs, pertinent lab work and imaging were reviewed and interpreted as discussed in the ED course. Hospitalization was considered for further testing, treatments, or serial exams/observation. However as patient is well-appearing, has a stable exam, and reassuring studies today, I do not feel that they warrant admission at this time. This plan was discussed with the patient who verbalizes agreement and comfort  with this plan and seems reliable and able to return to the Emergency Department with worsening or changing symptoms.            Final Clinical Impression(s) / ED Diagnoses Final diagnoses:  Generalized abdominal pain    Rx / DC Orders ED Discharge Orders          Ordered    metoCLOPramide (REGLAN) 10 MG tablet  Every 8 hours PRN        03/02/24 2329              Renne Crigler, PA-C 03/02/24 2335    Melene Plan, DO 03/05/24 1100

## 2024-03-03 ENCOUNTER — Ambulatory Visit (INDEPENDENT_AMBULATORY_CARE_PROVIDER_SITE_OTHER): Admitting: Student in an Organized Health Care Education/Training Program

## 2024-03-03 VITALS — BP 110/74 | HR 75 | Temp 98.1°F | Ht 62.0 in | Wt 193.0 lb

## 2024-03-03 DIAGNOSIS — H6121 Impacted cerumen, right ear: Secondary | ICD-10-CM | POA: Diagnosis not present

## 2024-03-03 DIAGNOSIS — N920 Excessive and frequent menstruation with regular cycle: Secondary | ICD-10-CM | POA: Insufficient documentation

## 2024-03-03 DIAGNOSIS — K5000 Crohn's disease of small intestine without complications: Secondary | ICD-10-CM

## 2024-03-03 DIAGNOSIS — E669 Obesity, unspecified: Secondary | ICD-10-CM | POA: Diagnosis not present

## 2024-03-03 DIAGNOSIS — F321 Major depressive disorder, single episode, moderate: Secondary | ICD-10-CM | POA: Insufficient documentation

## 2024-03-03 DIAGNOSIS — Z30011 Encounter for initial prescription of contraceptive pills: Secondary | ICD-10-CM | POA: Diagnosis not present

## 2024-03-03 LAB — HEMOGLOBIN A1C: Hgb A1c MFr Bld: 5.6 % (ref 4.6–6.5)

## 2024-03-03 LAB — LIPID PANEL
Cholesterol: 212 mg/dL — ABNORMAL HIGH (ref 0–200)
HDL: 56.6 mg/dL (ref >39.00)
LDL Cholesterol: 138 mg/dL — ABNORMAL HIGH (ref 0–99)
NonHDL: 155.54
Total CHOL/HDL Ratio: 4
Triglycerides: 89 mg/dL (ref 0.0–149.0)
VLDL: 17.8 mg/dL (ref 0.0–40.0)

## 2024-03-03 LAB — TSH: TSH: 0.89 u[IU]/mL (ref 0.35–5.50)

## 2024-03-03 MED ORDER — SERTRALINE HCL 50 MG PO TABS: 50.0000 mg | ORAL_TABLET | Freq: Every day | ORAL | 3 refills | Status: DC

## 2024-03-03 MED ORDER — NORETHIN ACE-ETH ESTRAD-FE 1-20 MG-MCG(24) PO TABS: 1.0000 | ORAL_TABLET | Freq: Every day | ORAL | 4 refills | Status: DC

## 2024-03-03 NOTE — Assessment & Plan Note (Signed)
 History of inflammatory bowel disease diagnosed in 2012 with a colonoscopy that showed ileitis.  This was previously managed by Dr. Karie Georges.  Patient tells me that she has Crohn's disease, and that she was treated with mesalamine for a period.  Her symptoms were stable for long enough that she has been off medication for several years.  Because she was treated with mesalamine I wonder if she actually had ulcerative colitis.  She had a couple recent episodes of acute GI illness that was self-limited, CT imaging without colitis during those times.  A little hard to tell if those were IBD flares.  She does have stiffness and discomfort in the SI joint for the last many months which may be related.  I do not have records from Dr. [name].  Patient has not been able to reestablish with their office recently because of insurance issues.  Get a make referral to [facility].  If we think she has Crohn's disease, she may benefit from sulfasalazine to treat the SI symptoms and GI flares.  Overall today her symptoms do seem mild.

## 2024-03-03 NOTE — Assessment & Plan Note (Signed)
 Symptoms consistent with mild to moderate depression.  This is associated with caregiver stress, and her own acute medical issues over the last 2 months.  PHQ-9 score today of 9.  We talked about using antidepressant medications to be supportive of her mood and improve functional status.  Will plan to start sertraline 50 mg daily.  We talked about the risk of GI side effects.  Will start at a low dose and follow-up in 4 weeks.

## 2024-03-03 NOTE — Assessment & Plan Note (Addendum)
 History of heavy menstrual bleeding.  I also noticed that she has a microcytosis on recent CBC, no anemia.  She has some symptoms of fatigue so I wonder if she has an iron deficiency.  Will check iron levels today.  We talked about using hormonal birth control pill to manage the menorrhagia.  Will start Loestrin.  Though she does have mild IBD, I think her risk of a thromboembolic event is low, and benefits of birth control outweigh this risk.

## 2024-03-03 NOTE — Assessment & Plan Note (Signed)
 Chronic issue.  Weight today 193 pounds with a BMI of 35.  We talked about exercise, and weight to better manage the mood disorder.  Will check A1c because of family history of diabetes, and check lipids.

## 2024-03-03 NOTE — Progress Notes (Signed)
 New Patient Office Visit  Subjective    Patient ID: Frances Mccormick, female    DOB: [birth year]  Age: 38 y.o. MRN: [id number]  CC:  Chief Complaint  Patient presents with   Establish Care    Was seen in ED recently and needs to get back into gastro and some depression     HPI  Frances Mccormick presents to establish care  38 year old person living with a history of inflammatory bowel disease here for management of that condition as well as concerns of depressed mood.  Patient lives by herself with her 62-year-old son.  He was born with tracheal defects and has a tracheostomy and is dependent on G-tube feedings.  She is his full-time caregiver, she has not worked since his birth.  She receives income through disability.  She has some nursing help for him at home.  But she has been a dedicated mother and care essentially for the last 2 years.  She has a history of inflammatory bowel disease diagnosed in 2012.  This was treated for a time with Lialda, but her symptoms were so quiescent that they have decided to discontinue the medication several years ago.  She was doing well until March when she had a acute nausea and vomiting episode.  This was associated with some lower abdominal discomfort.  She was evaluated in the emergency department and CT was reassuring.  She was treated with supportive care for this episode in March and then for another episode like it in April.  Today she says she is feeling better, has not had recent nausea, started to take in more solid foods.  Denies any fevers or chills.  No new skin rashes.  She does report stiffness in her lower back around the SI joints.  She treats this with Aleve which is successful about 2 days a week.    Outpatient Encounter Medications as of 03/03/2024  Medication Sig   metoCLOPramide (REGLAN) 10 MG tablet Take 1 tablet (10 mg total) by mouth every 8 (eight) hours as needed for nausea or vomiting.   ondansetron (ZOFRAN-ODT) 4 MG  disintegrating tablet Take 1 tablet (4 mg total) by mouth every 8 (eight) hours as needed for nausea or vomiting.   sertraline (ZOLOFT) 50 MG tablet Take 1 tablet (50 mg total) by mouth daily.   [DISCONTINUED] dicyclomine (BENTYL) 20 MG tablet Take 1 tablet (20 mg total) by mouth 2 (two) times daily.   [DISCONTINUED] ondansetron (ZOFRAN) 4 MG tablet Take 1 tablet (4 mg total) by mouth every 6 (six) hours.   [DISCONTINUED] ondansetron (ZOFRAN-ODT) 4 MG disintegrating tablet Take 1 tablet (4 mg total) by mouth every 8 (eight) hours as needed for nausea or vomiting.   [DISCONTINUED] promethazine (PHENERGAN) 25 MG suppository Place 1 suppository (25 mg total) rectally every 6 (six) hours as needed for nausea or vomiting.   Norethindrone Acetate-Ethinyl Estrad-FE (LOESTRIN 24 FE) 1-20 MG-MCG(24) tablet Take 1 tablet by mouth daily.   [DISCONTINUED] Norethindrone Acetate-Ethinyl Estrad-FE (LOESTRIN 24 FE) 1-20 MG-MCG(24) tablet Take 1 tablet by mouth daily. (Patient not taking: Reported on 10/24/2023)   [DISCONTINUED] oxyCODONE-acetaminophen (PERCOCET/ROXICET) 5-325 MG tablet Take 1 tablet by mouth every 6 (six) hours as needed for up to 8 doses for severe pain (pain score 7-10). (Patient not taking: Reported on 03/03/2024)   [DISCONTINUED] potassium chloride SA (KLOR-CON M) 20 MEQ tablet Take 1 tablet (20 mEq total) by mouth once for 1 dose.   [DISCONTINUED] predniSONE (DELTASONE) 20 MG tablet  Take 2 tablets (40 mg total) by mouth daily. (Patient not taking: Reported on 03/03/2024)   No facility-administered encounter medications on file as of 03/03/2024.    Past Medical History:  Diagnosis Date   Advanced maternal age in multigravida, second trimester 04/25/2022   Allergy    Bee venom   Anxiety    Chronic hypertension with superimposed preeclampsia 08/11/2018   Crohn's colitis (HCC)    Depression    Hidradenitis suppurativa    Hypertension    Hyperthyroidism affecting pregnancy in first trimester  01/12/2022   Repeat TFTs in 2nd trimester     Migraine    "a few/year" (07/27/2018)   Non-compliant patient    Preterm delivery after induction of labor 06/20/2022   Preterm premature rupture of membranes (PPROM) with onset of labor within 24 hours of rupture in third trimester, antepartum 06/19/2022   Supervision of high risk pregnancy, antepartum 12/16/2021              Nursing Staff    Provider      Office Location     [facility]    Dating     LMP,  9 week [place]      [facility] Model    Arly.[email or web address] ] Traditional  [ ]  Centering  [ ]  Mom-Baby Dyad                Language     English    Anatomy US     Nl 19 weeks      Flu Vaccine      04/25/2022    Genetic/Carrier Screen     NIPS:   LR female  AFP:   *Screen Negative*  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      TDaP Vaccine          Hgb    Past Surgical History:  Procedure Laterality Date   INDUCED ABORTION      Family History  Problem Relation Age of Onset   Hypertension Mother    Diabetes Mother    Heart disease Mother    Diabetes Father    Hypertension Maternal Grandmother    Stroke Maternal Grandmother    Hypertension Maternal Grandfather    Diabetes Maternal Grandfather    Breast cancer Maternal Aunt     Social History   Socioeconomic History   Marital status: Single    Spouse name: Not on file   Number of children: Not on file   Years of education: Not on file   Highest education level: Some college, no degree  Occupational History   Not on file  Tobacco Use   Smoking status: Never   Smokeless tobacco: Never  Vaping Use   Vaping status: Never Used  Substance and Sexual Activity   Alcohol use: Not Currently    Alcohol/week: 2.0 standard drinks of alcohol    Types: 1 Glasses of wine, 1 Cans of beer per week    Comment: weekly   Drug use: Yes    Types: Marijuana   Sexual activity: Not Currently    Partners: Male    Birth control/protection: None  Other Topics Concern   Not on file  Social History Narrative   Not on file   Social Drivers  of Health   Financial Resource Strain: Medium Risk (03/03/2024)   Overall Financial Resource Strain (CARDIA)    Difficulty of Paying Living Expenses: Somewhat hard  Food Insecurity: No Food Insecurity (03/03/2024)   Hunger Vital Sign    Worried About Running  Out of Food in the Last Year: Never true    Ran Out of Food in the Last Year: Never true  Transportation Needs: No Transportation Needs (03/03/2024)   PRAPARE - Administrator, Civil Service (Medical): No    Lack of Transportation (Non-Medical): No  Physical Activity: Insufficiently Active (03/03/2024)   Exercise Vital Sign    Days of Exercise per Week: 2 days    Minutes of Exercise per Session: 30 min  Stress: Stress Concern Present (03/03/2024)   [employer] of Occupational Health - Occupational Stress Questionnaire    Feeling of Stress : To some extent  Social Connections: Moderately Integrated (03/03/2024)   Social Connection and Isolation Panel [NHANES]    Frequency of Communication with Friends and Family: More than three times a week    Frequency of Social Gatherings with Friends and Family: Once a week    Attends Religious Services: More than 4 times per year    Active Member of [employer] or Organizations: Yes    Attends Banker Meetings: More than 4 times per year    Marital Status: Never married  Intimate Partner Violence: Not At Risk (10/25/2023)   Humiliation, Afraid, Rape, and Kick questionnaire    Fear of Current or Ex-Partner: No    Emotionally Abused: No    Physically Abused: No    Sexually Abused: No         Objective    BP 110/74   Pulse 75   Temp 98.1 F (36.7 C) (Temporal)   Ht 5\' 2"  (1.575 m)   Wt 193 lb (87.5 kg)   LMP 02/28/2024 (Exact Date)   SpO2 100%   BMI 35.30 kg/m   Physical Exam  General: Well-appearing woman Ears: Left ear was normal, right ear was impacted with cerumen, no this was removed with a curette, tympanic membrane was normal Neck: Normal thyroid, no  adenopathy Heart: Regular, no murmur Lungs unlabored, clear throughout:  Abd: Soft, nontender, nondistended Ext: Warm, well-perfused, no edema, normal joints Back: Tenderness over bilateral SI joints, good range of motion Neuro: Alert, conversational, full strength upper and lower extremities, normal gait Psych: Appropriate mood and affect, mildly depressed appearing, linear thoughts, well put together      Assessment & Plan:   Problem List Items Addressed This Visit       High   Crohn's disease (HCC) - Primary (Chronic)   History of inflammatory bowel disease diagnosed in 2012 with a colonoscopy that showed ileitis.  This was previously managed by Dr. Karie Georges.  Patient tells me that she has Crohn's disease, and that she was treated with mesalamine for a period.  Her symptoms were stable for long enough that she has been off medication for several years.  Because she was treated with mesalamine I wonder if she actually had ulcerative colitis.  She had a couple recent episodes of acute GI illness that was self-limited, CT imaging without colitis during those times.  A little hard to tell if those were IBD flares.  She does have stiffness and discomfort in the SI joint for the last many months which may be related.  I do not have records from Dr. [name].  Patient has not been able to reestablish with their office recently because of insurance issues.  Get a make referral to [facility].  If we think she has Crohn's disease, she may benefit from sulfasalazine to treat the SI symptoms and GI flares.  Overall today her symptoms  do seem mild.      Relevant Orders   Ambulatory referral to Gastroenterology   Depression, major, single episode, moderate (HCC) (Chronic)   Symptoms consistent with mild to moderate depression.  This is associated with caregiver stress, and her own acute medical issues over the last 2 months.  PHQ-9 score today of 9.  We talked about using antidepressant medications to be  supportive of her mood and improve functional status.  Will plan to start sertraline 50 mg daily.  We talked about the risk of GI side effects.  Will start at a low dose and follow-up in 4 weeks.      Relevant Medications   sertraline (ZOLOFT) 50 MG tablet   Other Relevant Orders   TSH     Medium    Obesity (BMI 35.0-39.9 without comorbidity) (Chronic)   Chronic issue.  Weight today 193 pounds with a BMI of 35.  We talked about exercise, and weight to better manage the mood disorder.  Will check A1c because of family history of diabetes, and check lipids.      Relevant Orders   Hemoglobin A1c   Lipid panel   TSH   Menorrhagia (Chronic)   History of heavy menstrual bleeding.  I also noticed that she has a microcytosis on recent CBC, no anemia.  She has some symptoms of fatigue so I wonder if she has an iron deficiency.  Will check iron levels today.  We talked about using hormonal birth control pill to manage the menorrhagia.  Will start Loestrin.  Though she does have mild IBD, I think her risk of a thromboembolic event is low, and benefits of birth control outweigh this risk.      Relevant Medications   Norethindrone Acetate-Ethinyl Estrad-FE (LOESTRIN 24 FE) 1-20 MG-MCG(24) tablet   Other Relevant Orders   Iron, TIBC and Ferritin Panel     Unprioritized   Right ear impacted cerumen   Procedure Note: Manual Removal of Impacted Cerumen Using a Curette   Indication:  Cerumen impaction causing symptoms (e.g., hearing loss, pain, tinnitus) or preventing assessment of the ear canal and tympanic membrane.  Procedure:  Explained the procedure to the patient and informed consent was obtained  Review patient history for contraindications (e.g., nonintact tympanic membrane, history of ear surgery, anatomical abnormalities).[1]  After a position of the patient's head upright, I visualized the ear canal and cerumen using an otoscope.  I gently inserted the curette into the ear canal  avoiding contact with the canal walls, and carefully scooped the cerumen, removing it in small pieces.  I reassessed the ear canal and tympanic membrane, there was no residual cerumen nor signs of trauma.  Follow-Up:  I instructed the patient to report any persistent symptoms such as pain, discharge, or hearing loss.  Schedule a follow-up appointment if necessary.       Other Visit Diagnoses       Encounter for BCP (birth control pills) initial prescription       Relevant Medications   Norethindrone Acetate-Ethinyl Estrad-FE (LOESTRIN 24 FE) 1-20 MG-MCG(24) tablet       Return in about 4 weeks (around 03/31/2024) for Depression follow up.   Tyson Alias, MD

## 2024-03-03 NOTE — Assessment & Plan Note (Signed)
 Procedure Note: Manual Removal of Impacted Cerumen Using a Curette   Indication:  Cerumen impaction causing symptoms (e.g., hearing loss, pain, tinnitus) or preventing assessment of the ear canal and tympanic membrane.  Procedure:  Explained the procedure to the patient and informed consent was obtained  Review patient history for contraindications (e.g., nonintact tympanic membrane, history of ear surgery, anatomical abnormalities).[1]  After a position of the patient's head upright, I visualized the ear canal and cerumen using an otoscope.  I gently inserted the curette into the ear canal avoiding contact with the canal walls, and carefully scooped the cerumen, removing it in small pieces.  I reassessed the ear canal and tympanic membrane, there was no residual cerumen nor signs of trauma.  Follow-Up:  I instructed the patient to report any persistent symptoms such as pain, discharge, or hearing loss.  Schedule a follow-up appointment if necessary.

## 2024-03-04 ENCOUNTER — Encounter: Payer: Self-pay | Admitting: Student in an Organized Health Care Education/Training Program

## 2024-03-04 LAB — IRON,TIBC AND FERRITIN PANEL
%SAT: 31 % (ref 16–45)
Ferritin: 77 ng/mL (ref 16–154)
Iron: 93 ug/dL (ref 40–190)
TIBC: 296 ug/dL (ref 250–450)

## 2024-03-31 ENCOUNTER — Encounter: Payer: Self-pay | Admitting: Student in an Organized Health Care Education/Training Program

## 2024-03-31 ENCOUNTER — Ambulatory Visit (INDEPENDENT_AMBULATORY_CARE_PROVIDER_SITE_OTHER): Admitting: Student in an Organized Health Care Education/Training Program

## 2024-03-31 ENCOUNTER — Encounter: Payer: Self-pay | Admitting: Pharmacist

## 2024-03-31 VITALS — BP 128/80 | HR 74 | Wt 199.0 lb

## 2024-03-31 DIAGNOSIS — N920 Excessive and frequent menstruation with regular cycle: Secondary | ICD-10-CM | POA: Diagnosis not present

## 2024-03-31 DIAGNOSIS — F321 Major depressive disorder, single episode, moderate: Secondary | ICD-10-CM

## 2024-03-31 DIAGNOSIS — K5000 Crohn's disease of small intestine without complications: Secondary | ICD-10-CM | POA: Diagnosis not present

## 2024-03-31 NOTE — Assessment & Plan Note (Signed)
 Chronic and stable, symptoms of abdominal discomfort and nausea are improving.  Not on treatment for the Crohn's disease.  I referred her to GI, she is going to contact the [place] office and schedule a follow-up.

## 2024-03-31 NOTE — Progress Notes (Signed)
   Established Patient Office Visit  Subjective   Patient ID: Frances Mccormick, female    DOB: 09-03-1986  Age: 38 y.o. MRN: [id number]  Chief Complaint  Patient presents with   Medical Management of Chronic Issues    4 week follow up     HPI  38 year old woman here for management of depression and anxiety.  Doing very well since I last saw her.  Started sertraline  without complications.  Reports benefits in her mood.  Sleep is going okay.  Denies any nausea or vomiting.  No other adverse side effects.  No acute illness.  She has untreated Crohn's disease and was having some issues with recent nausea and vomiting symptoms.  These are much improved now with supportive care alone.  Also having still some menorrhagia with heavy menstrual bleeding.     Objective:     BP 128/80   Pulse 74   Wt 199 lb (90.3 kg)   LMP 02/28/2024 (Exact Date)   SpO2 100%   BMI 36.40 kg/m    Physical Exam  Gen: Well-appearing with Heart: Regular, no murmur Abd: Soft, nontender Ext: Warm, no edema Psych: Appropriate mood and affect, not anxious or depressed appearing, very pleasant to talk with, organized speech and linear thoughts    Assessment & Plan:   Problem List Items Addressed This Visit       High   Crohn's disease (HCC) (Chronic)   Chronic and stable, symptoms of abdominal discomfort and nausea are improving.  Not on treatment for the Crohn's disease.  I referred her to GI, she is going to contact the [place] office and schedule a follow-up.      Depression, major, single episode, moderate (HCC) - Primary (Chronic)   Chronic and improving.  Tolerating sertraline  50 mg daily.  Reports good benefit in her mood, improving functional status.  Sleep is going okay.  No adverse side effects.  Will plan to continue sertraline  50 mg daily.        Medium    Menorrhagia (Chronic)   Chronic and stable.  Menorrhagia still an issue for her.  I prescribed Loestrin, but the cost of this was high  with her Medicaid coverage.  I am going to talk with pharmacy and see if there is a more preferred form of combination estrogen and progesterone that we can use for her menorrhagia.       Return in about 6 months (around 10/01/2024).    Ether Hercules, MD

## 2024-03-31 NOTE — Assessment & Plan Note (Signed)
 Chronic and improving.  Tolerating sertraline  50 mg daily.  Reports good benefit in her mood, improving functional status.  Sleep is going okay.  No adverse side effects.  Will plan to continue sertraline  50 mg daily.

## 2024-03-31 NOTE — Assessment & Plan Note (Signed)
 Chronic and stable.  Menorrhagia still an issue for her.  I prescribed Loestrin, but the cost of this was high with her Medicaid coverage.  I am going to talk with pharmacy and see if there is a more preferred form of combination estrogen and progesterone that we can use for her menorrhagia.

## 2024-03-31 NOTE — Progress Notes (Signed)
 03/31/2024 Name: Frances Mccormick MRN: [id number] DOB: Oct 01, 1986  Chief Complaint  Patient presents with   Medication Management    Subjective:  Received the message below from patient's PCP, Dr Gayl Katos. Patient is having difficulty getting her birth control pill filled.   "Hi Frances Mccormick. MRN: [id number]  I prescribed Loestrin combination OCP for this person with medicaid, but she tells me the cost is $90 for a 3 month supply. I can't find if there is another more preferred OCP in the medicaid PDL. Can you help me find a more affordable option. She has menorrhagia so I would prefer to use a combination hormone tablet. Thanks."  [employer] - they show that patient has another insurance and Medicaid is requiring that they use it as primary. Per [facility] they have not been able to use Medicaid as secondary insurance.    Verified with patient that she does not have any other insurance coverage other than Medicaid.    Medication Access/Adherence  Current Pharmacy:  [facility] [address], [place] - [address] [place] [address] Phone: [phone] Fax: [phone]  [facility] [phone] - [place], [place] - [address] AT [facility] [address] [place] [address] Phone: [phone] Fax: [phone]  [id number] - [place],  - [address] AT [facility] [address] [place] [address] Phone: [phone] Fax: [phone]  [place] [facility] [address] [place] [address] Phone: [phone] Fax: [phone]  [id number] - [place],  - [address] AT [facility] [address] [place] [address] Phone: [phone] Fax: [phone]   Patient reports affordability concerns with their medications: Yes  Patient reports adherence  concerns with their medications:  Yes    Objective:  Lab Results  Component Value Date   HGBA1C 5.6 03/03/2024    Lab Results  Component Value Date   CREATININE 1.14 (H) 03/02/2024   BUN 17 03/02/2024   NA 138 03/02/2024   K 3.3 (L) 03/02/2024   CL 99 03/02/2024   CO2 22 03/02/2024    Lab Results  Component Value Date   CHOL 212 (H) 03/03/2024   HDL 56.60 03/03/2024   LDLCALC 138 (H) 03/03/2024   TRIG 89.0 03/03/2024   CHOLHDL 4 03/03/2024    Medications Reviewed Today     Reviewed by Cecilie Coffee, RPH-CPP (Pharmacist) on 03/31/24 at 1532  Med List Status: <None>   Medication Order Taking? Sig Documenting Provider Last Dose Status Informant  metoCLOPramide  (REGLAN ) 10 MG tablet [id number] No Take 1 tablet (10 mg total) by mouth every 8 (eight) hours as needed for nausea or vomiting. Lyna Sandhoff, PA-C Taking Active   Norethindrone  Acetate-Ethinyl Estrad-FE (LOESTRIN 24 FE) 1-20 MG-MCG(24) tablet [id number] No Take 1 tablet by mouth daily. Ether Hercules, MD Taking Active   ondansetron  (ZOFRAN -ODT) 4 MG disintegrating tablet [id number] No Take 1 tablet (4 mg total) by mouth every 8 (eight) hours as needed for nausea or vomiting. Modena Andes, MD Taking Active   sertraline  (ZOLOFT ) 50 MG tablet [id number] No Take 1 tablet (50 mg total) by mouth daily. Ether Hercules, MD Taking Active               Assessment/Plan:   Medication Management - patient has not been able to get her  BPCs due to insurance processing issue.  - Checked [place] Medicaid preferred drug list - the generic Loestrin 24 FE should be covered. - Coordinated with patient and pharmacy regarding current prescription drug coverage. [facility] re-ran medication on patients only acitve plan with [employer] Medicaid and cost was $0.   Follow Up Plan: not needed but patient is aware she can contact me at [phone] if any future issues with medication cost / coverage.   Cecilie Coffee, PharmD Clinical Pharmacist [facility]  Population Health [phone]

## 2024-04-20 ENCOUNTER — Emergency Department (HOSPITAL_COMMUNITY)

## 2024-04-20 ENCOUNTER — Encounter (HOSPITAL_COMMUNITY): Payer: Self-pay

## 2024-04-20 ENCOUNTER — Other Ambulatory Visit: Payer: Self-pay

## 2024-04-20 ENCOUNTER — Inpatient Hospital Stay (HOSPITAL_COMMUNITY)
Admission: EM | Admit: 2024-04-20 | Discharge: 2024-04-23 | DRG: 287 | Disposition: A | Discharge status: Home / Self Care | Attending: Internal Medicine | Admitting: Internal Medicine

## 2024-04-20 DIAGNOSIS — R509 Fever, unspecified: Secondary | ICD-10-CM | POA: Diagnosis not present

## 2024-04-20 DIAGNOSIS — Z6837 Body mass index (BMI) 37.0-37.9, adult: Secondary | ICD-10-CM

## 2024-04-20 DIAGNOSIS — F1290 Cannabis use, unspecified, uncomplicated: Secondary | ICD-10-CM | POA: Diagnosis present

## 2024-04-20 DIAGNOSIS — D72829 Elevated white blood cell count, unspecified: Secondary | ICD-10-CM | POA: Diagnosis present

## 2024-04-20 DIAGNOSIS — R079 Chest pain, unspecified: Secondary | ICD-10-CM | POA: Diagnosis not present

## 2024-04-20 DIAGNOSIS — K5010 Crohn's disease of large intestine without complications: Secondary | ICD-10-CM | POA: Diagnosis present

## 2024-04-20 DIAGNOSIS — Z87891 Personal history of nicotine dependence: Secondary | ICD-10-CM

## 2024-04-20 DIAGNOSIS — R051 Acute cough: Secondary | ICD-10-CM | POA: Diagnosis not present

## 2024-04-20 DIAGNOSIS — I160 Hypertensive urgency: Secondary | ICD-10-CM | POA: Diagnosis present

## 2024-04-20 DIAGNOSIS — R9431 Abnormal electrocardiogram [ECG] [EKG]: Secondary | ICD-10-CM

## 2024-04-20 DIAGNOSIS — E66812 Obesity, class 2: Secondary | ICD-10-CM | POA: Diagnosis present

## 2024-04-20 DIAGNOSIS — K5090 Crohn's disease, unspecified, without complications: Secondary | ICD-10-CM | POA: Diagnosis not present

## 2024-04-20 DIAGNOSIS — R1084 Generalized abdominal pain: Secondary | ICD-10-CM | POA: Diagnosis not present

## 2024-04-20 DIAGNOSIS — I5021 Acute systolic (congestive) heart failure: Secondary | ICD-10-CM

## 2024-04-20 DIAGNOSIS — F32A Depression, unspecified: Secondary | ICD-10-CM | POA: Diagnosis present

## 2024-04-20 DIAGNOSIS — J069 Acute upper respiratory infection, unspecified: Secondary | ICD-10-CM | POA: Diagnosis not present

## 2024-04-20 DIAGNOSIS — E785 Hyperlipidemia, unspecified: Secondary | ICD-10-CM | POA: Diagnosis present

## 2024-04-20 DIAGNOSIS — R0789 Other chest pain: Secondary | ICD-10-CM | POA: Diagnosis not present

## 2024-04-20 DIAGNOSIS — R112 Nausea with vomiting, unspecified: Secondary | ICD-10-CM | POA: Diagnosis not present

## 2024-04-20 DIAGNOSIS — I409 Acute myocarditis, unspecified: Principal | ICD-10-CM

## 2024-04-20 DIAGNOSIS — R030 Elevated blood-pressure reading, without diagnosis of hypertension: Secondary | ICD-10-CM

## 2024-04-20 DIAGNOSIS — Z79899 Other long term (current) drug therapy: Secondary | ICD-10-CM

## 2024-04-20 DIAGNOSIS — I10 Essential (primary) hypertension: Secondary | ICD-10-CM | POA: Diagnosis not present

## 2024-04-20 DIAGNOSIS — Z8249 Family history of ischemic heart disease and other diseases of the circulatory system: Secondary | ICD-10-CM

## 2024-04-20 DIAGNOSIS — R110 Nausea: Secondary | ICD-10-CM | POA: Diagnosis not present

## 2024-04-20 DIAGNOSIS — Z1152 Encounter for screening for COVID-19: Secondary | ICD-10-CM

## 2024-04-20 DIAGNOSIS — E876 Hypokalemia: Secondary | ICD-10-CM | POA: Diagnosis present

## 2024-04-20 DIAGNOSIS — R7989 Other specified abnormal findings of blood chemistry: Secondary | ICD-10-CM

## 2024-04-20 DIAGNOSIS — D751 Secondary polycythemia: Secondary | ICD-10-CM

## 2024-04-20 DIAGNOSIS — R1111 Vomiting without nausea: Secondary | ICD-10-CM | POA: Diagnosis not present

## 2024-04-20 DIAGNOSIS — E669 Obesity, unspecified: Secondary | ICD-10-CM | POA: Diagnosis present

## 2024-04-20 DIAGNOSIS — I214 Non-ST elevation (NSTEMI) myocardial infarction: Secondary | ICD-10-CM

## 2024-04-20 LAB — COMPREHENSIVE METABOLIC PANEL WITH GFR
ALT: 17 U/L (ref 0–44)
AST: 36 U/L (ref 15–41)
Albumin: 5 g/dL (ref 3.5–5.0)
Alkaline Phosphatase: 89 U/L (ref 38–126)
Anion gap: 16 — ABNORMAL HIGH (ref 5–15)
BUN: 8 mg/dL (ref 6–20)
CO2: 21 mmol/L — ABNORMAL LOW (ref 22–32)
Calcium: 9.6 mg/dL (ref 8.9–10.3)
Chloride: 101 mmol/L (ref 98–111)
Creatinine, Ser: 0.62 mg/dL (ref 0.44–1.00)
GFR, Estimated: >60 mL/min (ref >60)
Glucose, Bld: 139 mg/dL — ABNORMAL HIGH (ref 70–99)
Potassium: 3.6 mmol/L (ref 3.5–5.1)
Sodium: 138 mmol/L (ref 135–145)
Total Bilirubin: 0.9 mg/dL (ref 0.0–1.2)
Total Protein: 10 g/dL — ABNORMAL HIGH (ref 6.5–8.1)

## 2024-04-20 LAB — TROPONIN I (HIGH SENSITIVITY)
Troponin I (High Sensitivity): 33 ng/L — ABNORMAL HIGH (ref <18)
Troponin I (High Sensitivity): 864 ng/L (ref <18)
Troponin I (High Sensitivity): 1492 ng/L (ref <18)

## 2024-04-20 LAB — CBC WITH DIFFERENTIAL/PLATELET
Abs Immature Granulocytes: 0.1 10*3/uL — ABNORMAL HIGH (ref 0.00–0.07)
Basophils Absolute: 0.1 10*3/uL (ref 0.0–0.1)
Basophils Relative: 0 %
Eosinophils Absolute: 0 10*3/uL (ref 0.0–0.5)
Eosinophils Relative: 0 %
HCT: 49 % — ABNORMAL HIGH (ref 36.0–46.0)
Hemoglobin: 15.9 g/dL — ABNORMAL HIGH (ref 12.0–15.0)
Immature Granulocytes: 1 %
Lymphocytes Relative: 6 %
Lymphs Abs: 1.2 10*3/uL (ref 0.7–4.0)
MCH: 26.2 pg (ref 26.0–34.0)
MCHC: 32.4 g/dL (ref 30.0–36.0)
MCV: 80.6 fL (ref 80.0–100.0)
Monocytes Absolute: 0.4 10*3/uL (ref 0.1–1.0)
Monocytes Relative: 2 %
Neutro Abs: 18.4 10*3/uL — ABNORMAL HIGH (ref 1.7–7.7)
Neutrophils Relative %: 91 %
Platelets: 388 10*3/uL (ref 150–400)
RBC: 6.08 MIL/uL — ABNORMAL HIGH (ref 3.87–5.11)
RDW: 16.8 % — ABNORMAL HIGH (ref 11.5–15.5)
WBC: 20.3 10*3/uL — ABNORMAL HIGH (ref 4.0–10.5)
nRBC: 0 % (ref 0.0–0.2)

## 2024-04-20 LAB — URINALYSIS, ROUTINE W REFLEX MICROSCOPIC
Bacteria, UA: NONE SEEN
Bilirubin Urine: NEGATIVE
Glucose, UA: 150 mg/dL — AB
Ketones, ur: 5 mg/dL — AB
Leukocytes,Ua: NEGATIVE
Nitrite: NEGATIVE
Protein, ur: 30 mg/dL — AB
Specific Gravity, Urine: 1.009 (ref 1.005–1.030)
pH: 8 (ref 5.0–8.0)

## 2024-04-20 LAB — RESP PANEL BY RT-PCR (RSV, FLU A&B, COVID)  RVPGX2
Influenza A by PCR: NEGATIVE
Influenza B by PCR: NEGATIVE
Resp Syncytial Virus by PCR: NEGATIVE
SARS Coronavirus 2 by RT PCR: NEGATIVE

## 2024-04-20 LAB — RAPID URINE DRUG SCREEN, HOSP PERFORMED
Amphetamines: NOT DETECTED
Barbiturates: NOT DETECTED
Benzodiazepines: NOT DETECTED
Cocaine: NOT DETECTED
Opiates: NOT DETECTED
Tetrahydrocannabinol: POSITIVE — AB

## 2024-04-20 LAB — LIPASE, BLOOD: Lipase: 28 U/L (ref 11–51)

## 2024-04-20 LAB — HCG, SERUM, QUALITATIVE: Preg, Serum: NEGATIVE

## 2024-04-20 MED ORDER — LABETALOL HCL 5 MG/ML IV SOLN: 5.0000 mg | INTRAVENOUS | Status: DC | PRN

## 2024-04-20 MED ORDER — ONDANSETRON HCL 4 MG/2ML IJ SOLN
4.0000 mg | Freq: Once | INTRAMUSCULAR | Status: AC
Start: 2024-04-20 — End: 2024-04-20
  Administered 2024-04-20: 4 mg via INTRAVENOUS
  Filled 2024-04-20: qty 2

## 2024-04-20 MED ORDER — POTASSIUM CHLORIDE CRYS ER 20 MEQ PO TBCR
40.0000 meq | EXTENDED_RELEASE_TABLET | Freq: Once | ORAL | Status: AC
  Administered 2024-04-20: 40 meq via ORAL
  Filled 2024-04-20: qty 2

## 2024-04-20 MED ORDER — LACTATED RINGERS IV SOLN: INTRAVENOUS | Status: DC

## 2024-04-20 MED ORDER — AMLODIPINE BESYLATE 5 MG PO TABS
5.0000 mg | ORAL_TABLET | Freq: Every day | ORAL | Status: DC
  Administered 2024-04-20: 5 mg via ORAL
  Filled 2024-04-20: qty 1

## 2024-04-20 MED ORDER — OXYCODONE HCL 5 MG PO TABS
5.0000 mg | ORAL_TABLET | Freq: Once | ORAL | Status: AC
  Administered 2024-04-20: 5 mg via ORAL
  Filled 2024-04-20: qty 1

## 2024-04-20 MED ORDER — AMLODIPINE BESYLATE 5 MG PO TABS: 5.0000 mg | ORAL_TABLET | Freq: Every day | ORAL | Status: DC

## 2024-04-20 MED ORDER — ONDANSETRON HCL 4 MG/2ML IJ SOLN
4.0000 mg | Freq: Four times a day (QID) | INTRAMUSCULAR | Status: DC | PRN
  Administered 2024-04-20: 4 mg via INTRAVENOUS
  Filled 2024-04-20: qty 2

## 2024-04-20 MED ORDER — MORPHINE SULFATE (PF) 4 MG/ML IV SOLN
4.0000 mg | Freq: Once | INTRAVENOUS | Status: AC
  Administered 2024-04-20: 4 mg via INTRAVENOUS
  Filled 2024-04-20: qty 1

## 2024-04-20 MED ORDER — IPRATROPIUM-ALBUTEROL 0.5-2.5 (3) MG/3ML IN SOLN
3.0000 mL | Freq: Once | RESPIRATORY_TRACT | Status: AC
  Administered 2024-04-20: 3 mL via RESPIRATORY_TRACT
  Filled 2024-04-20: qty 3

## 2024-04-20 MED ORDER — ACETAMINOPHEN 650 MG RE SUPP: 650.0000 mg | Freq: Four times a day (QID) | RECTAL | Status: DC | PRN

## 2024-04-20 MED ORDER — LABETALOL HCL 5 MG/ML IV SOLN
5.0000 mg | INTRAVENOUS | Status: DC | PRN
  Administered 2024-04-20: 5 mg via INTRAVENOUS
  Filled 2024-04-20: qty 4

## 2024-04-20 MED ORDER — SERTRALINE HCL 50 MG PO TABS
50.0000 mg | ORAL_TABLET | Freq: Every day | ORAL | Status: DC
  Administered 2024-04-20 – 2024-04-22 (×3): 50 mg via ORAL
  Filled 2024-04-20 (×3): qty 1

## 2024-04-20 MED ORDER — LORAZEPAM 2 MG/ML IJ SOLN: 0.5000 mg | Freq: Once | INTRAMUSCULAR | Status: DC

## 2024-04-20 MED ORDER — LORAZEPAM 2 MG/ML IJ SOLN
0.5000 mg | Freq: Once | INTRAMUSCULAR | Status: AC | PRN
  Administered 2024-04-20: 0.5 mg via INTRAVENOUS
  Filled 2024-04-20: qty 1

## 2024-04-20 MED ORDER — ENOXAPARIN SODIUM 40 MG/0.4ML IJ SOSY
40.0000 mg | PREFILLED_SYRINGE | INTRAMUSCULAR | Status: DC
  Administered 2024-04-20: 40 mg via SUBCUTANEOUS
  Filled 2024-04-20: qty 0.4

## 2024-04-20 MED ORDER — ACETAMINOPHEN 325 MG PO TABS: 650.0000 mg | ORAL_TABLET | Freq: Four times a day (QID) | ORAL | Status: DC | PRN

## 2024-04-20 MED ORDER — ONDANSETRON HCL 4 MG PO TABS: 4.0000 mg | ORAL_TABLET | Freq: Four times a day (QID) | ORAL | Status: DC | PRN

## 2024-04-20 MED ORDER — IPRATROPIUM-ALBUTEROL 0.5-2.5 (3) MG/3ML IN SOLN: 3.0000 mL | Freq: Four times a day (QID) | RESPIRATORY_TRACT | Status: DC | PRN

## 2024-04-20 MED ORDER — GUAIFENESIN-DM 100-10 MG/5ML PO SYRP
5.0000 mL | ORAL_SOLUTION | ORAL | Status: DC | PRN
  Administered 2024-04-22 (×3): 5 mL via ORAL
  Filled 2024-04-20 (×3): qty 5

## 2024-04-20 MED ORDER — TRIMETHOBENZAMIDE HCL 100 MG/ML IM SOLN
200.0000 mg | Freq: Four times a day (QID) | INTRAMUSCULAR | Status: DC | PRN
  Administered 2024-04-20 – 2024-04-21 (×3): 200 mg via INTRAMUSCULAR
  Filled 2024-04-20 (×5): qty 2

## 2024-04-20 MED ORDER — SODIUM CHLORIDE 0.9 % IV BOLUS
1000.0000 mL | Freq: Once | INTRAVENOUS | Status: AC
  Administered 2024-04-20: 1000 mL via INTRAVENOUS

## 2024-04-20 NOTE — Progress Notes (Signed)
 Repeat troponin significantly elevated at 864 (up from 33 earlier today). Patient without any active chest pain. Discussed with Cardiology who recommended holding off on heparin  gtt for now. Recommended obtaining echocardiogram and continue trending troponins. If troponins rise above 1000, then plan would be to transfer to [facility] for further evaluation by cardiology. Otherwise, keep at [facility] and cardiology will see tomorrow morning. EKG reviewed with cardiology, agree that it appears nonischemic and similar to prior EKG last month.  Differentials: Hypertensive emergency. BP remaining elevated in the 170s-120s with most recent BP 160/123. She has received a dose of amlodipine  5mg  around 9pm. Per cardiology recommendations, have changed prn IV labetalol  5mg  to be given for BP >160/110.  Myopericarditis from possible viral infection. Getting echocardiogram.   Plan: -cardiology consulted, appreciate assistance -holding off on heparin  gtt -continue trending troponins -if troponins rise above 1000, transfer to [facility] for further cardiology evaluation -f/u ECHO -IV labetalol  5mg  q2h prn for BP >160/110

## 2024-04-20 NOTE — ED Notes (Signed)
 Unsuccessful with initial IV start. Ultrasound was performed twice by two other staff members, unsuccessful. Placing IV Team Consult Order in Provider Notified.

## 2024-04-20 NOTE — ED Provider Notes (Signed)
 Emergency Department Provider Note   I have reviewed the triage vital signs and the nursing notes.   HISTORY  Chief Complaint Cough, Chest Pain, and Nausea   HPI Frances Mccormick is a 38 y.o. female with past history of Crohn's and HTN presents to the emergency department for evaluation of productive cough and posttussive emesis.  Patient denies any chest pain other than when she is coughing.  With emesis she does have some cramping pain in her epigastric region with vomiting but then seems to resolve.  She has not had much to eat or drink in the past several days due to vomiting.  She states her son has been home with named respiratory illness and recently diagnosed with asthma.  She has no prior history of asthma. No fever.    Past Medical History:  Diagnosis Date   Advanced maternal age in multigravida, second trimester 04/25/2022   Allergy    Bee venom   Anxiety    Chronic hypertension with superimposed preeclampsia 08/11/2018   Crohn's colitis (HCC)    Depression    Hidradenitis suppurativa    Hypertension    Hyperthyroidism affecting pregnancy in first trimester 01/12/2022   Repeat TFTs in 2nd trimester     Migraine    "a few/year" (07/27/2018)   Non-compliant patient    Preterm delivery after induction of labor 06/20/2022   Preterm premature rupture of membranes (PPROM) with onset of labor within 24 hours of rupture in third trimester, antepartum 06/19/2022   Supervision of high risk pregnancy, antepartum 12/16/2021              Nursing Staff    Provider      Office Location     [facility]    Dating     LMP,  9 week US       PNC Model    Galerius.Gant ] Traditional  [ ]  Centering  [ ]  Mom-Baby Dyad                Language     English    Anatomy US      Nl 19 weeks      Flu Vaccine      04/25/2022    Genetic/Carrier Screen     NIPS:   LR female  AFP:   *Screen Negative*  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  Respiratory: Denies shortness of breath. Gastrointestinal: Epigastric abdominal pain w/ active vomiting only.  Positive nausea and vomiting.  No diarrhea.  No constipation. Skin: Negative for rash. Neurological: Negative for headaches.  ____________________________________________   PHYSICAL EXAM:  VITAL SIGNS: ED Triage Vitals  Encounter Vitals Group     BP 04/20/24 0957 (!) 189/111     Pulse Rate 04/20/24 0957 78     Resp 04/20/24 0957 18     Temp 04/20/24 0957 97.8 F (36.6 C)     Temp Source 04/20/24 0957 Oral     SpO2 04/20/24 0957 100 %     Weight 04/20/24 0948 200 lb (90.7 kg)     Height 04/20/24 0948 5\' 1"  (1.549 m)   Constitutional: Alert and oriented. Tearful and appears uncomfortable but awake, alert, responding to questions.  Eyes: Conjunctivae are normal.  Head: Atraumatic. Nose: No congestion/rhinnorhea. Mouth/Throat: Mucous membranes are moist.  Neck: No stridor.  Cardiovascular: Normal rate, regular rhythm. Good peripheral circulation. Grossly normal heart sounds.   Respiratory: Normal respiratory effort.  No retractions. Lungs CTAB. Gastrointestinal: Soft and nontender. No distention.  Musculoskeletal: No gross deformities of extremities. Neurologic:  Normal speech and language. Skin:  Skin is warm, dry and intact. No rash noted.  ____________________________________________   LABS (all labs ordered are listed, but only abnormal results are displayed)  Labs Reviewed  COMPREHENSIVE METABOLIC PANEL WITH GFR - Abnormal; Notable for the following components:      Result Value   CO2 21 (*)    Glucose, Bld 139 (*)    Total Protein 10.0 (*)    Anion gap 16 (*)    All other components within normal limits  CBC WITH DIFFERENTIAL/PLATELET - Abnormal; Notable for the following components:   WBC 20.3 (*)    RBC 6.08 (*)    Hemoglobin 15.9 (*)    HCT 49.0 (*)    RDW 16.8 (*)    Neutro Abs  18.4 (*)    Abs Immature Granulocytes 0.10 (*)    All other components within normal limits  URINALYSIS, ROUTINE W REFLEX MICROSCOPIC - Abnormal; Notable for the following components:   Color, Urine STRAW (*)    Glucose, UA 150 (*)    Hgb urine dipstick MODERATE (*)    Ketones, ur 5 (*)    Protein, ur 30 (*)    All other components within normal limits  RAPID URINE DRUG SCREEN, HOSP PERFORMED - Abnormal; Notable for the following components:   Tetrahydrocannabinol POSITIVE (*)    All other components within normal limits  BASIC METABOLIC PANEL WITH GFR - Abnormal; Notable for the following components:   Potassium 3.2 (*)    CO2 19 (*)    Glucose, Bld 152 (*)    All other components within normal limits  CBC - Abnormal; Notable for the following components:   WBC 21.4 (*)    RBC 6.31 (*)    Hemoglobin 16.5 (*)    HCT 51.3 (*)    RDW 17.5 (*)    All other components within normal limits  TROPONIN I (HIGH SENSITIVITY) - Abnormal; Notable for the following components:   Troponin I (High Sensitivity) 33 (*)    All other components within normal limits  TROPONIN I (HIGH SENSITIVITY) - Abnormal; Notable for the following components:   Troponin I (High Sensitivity) 864 (*)    All other components within normal limits  TROPONIN I (HIGH SENSITIVITY) - Abnormal; Notable for the following components:   Troponin I (High Sensitivity) 1,492 (*)    All other components within normal limits  TROPONIN I (HIGH SENSITIVITY) - Abnormal; Notable for the following components:   Troponin I (High Sensitivity) 1,746 (*)    All other components within normal limits  TROPONIN I (HIGH SENSITIVITY) - Abnormal; Notable for the following components:   Troponin I (High Sensitivity) 1,961 (*)    All other components within normal limits  TROPONIN I (HIGH SENSITIVITY) - Abnormal; Notable for the following components:   Troponin I (High Sensitivity) 1,963 (*)    All other components within normal limits  RESP  PANEL BY RT-PCR (RSV, FLU A&B, COVID)  RVPGX2  RESPIRATORY PANEL BY PCR  LIPASE, BLOOD  HCG, SERUM, QUALITATIVE   ____________________________________________  EKG   EKG Interpretation Date/Time:  Wednesday Apr 20 2024 16:28:25 EDT Ventricular Rate:  87 PR Interval:  158 QRS Duration:  89 QT Interval:  438 QTC Calculation: 527 R Axis:  36  Text Interpretation: Sinus rhythm Prolonged QT interval Confirmed by Abby Hocking [phone]) on 04/21/2024 10:54:19 AM        ____________________________________________   PROCEDURES  Procedure(s) performed:   Procedures  None  ____________________________________________   INITIAL IMPRESSION / ASSESSMENT AND PLAN / ED COURSE  Pertinent labs & imaging results that were available during my care of the patient were reviewed by me and considered in my medical decision making (see chart for details).   This patient is Presenting for Evaluation of cough/vomiting, which does require a range of treatment options, and is a complaint that involves a high risk of morbidity and mortality.  The Differential Diagnoses include viral process, CAP, PE, ACS, gastritis, chron's flare, etc.  Critical Interventions-    Medications  acetaminophen  (TYLENOL ) tablet 650 mg ( Oral MAR Hold 04/21/24 1047)    Or  acetaminophen  (TYLENOL ) suppository 650 mg ( Rectal MAR Hold 04/21/24 1047)  ipratropium-albuterol (DUONEB) 0.5-2.5 (3) MG/3ML nebulizer solution 3 mL ( Nebulization MAR Hold 04/21/24 1047)  sertraline  (ZOLOFT ) tablet 50 mg ( Oral Automatically Held 05/06/24 2200)  guaiFENesin-dextromethorphan (ROBITUSSIN DM) 100-10 MG/5ML syrup 5 mL ( Oral MAR Hold 04/21/24 1047)  trimethobenzamide (TIGAN) injection 200 mg ( Intramuscular MAR Hold 04/21/24 1047)  amLODipine  (NORVASC ) tablet 5 mg ( Oral Automatically Held 05/06/24 1000)  lactated ringers  infusion (0 mLs Intravenous Stopped 04/21/24 0548)  labetalol  (NORMODYNE ) injection 5 mg ( Intravenous MAR Hold  04/21/24 1047)  aspirin chewable tablet 324 mg ( Oral MAR Hold 04/21/24 1047)  metoprolol  tartrate (LOPRESSOR ) tablet 25 mg ( Oral Automatically Held 04/29/24 2200)  atorvastatin (LIPITOR) tablet 80 mg ( Oral Automatically Held 04/29/24 1000)  0.9% sodium chloride  infusion (has no administration in time range)    Followed by  0.9% sodium chloride  infusion (has no administration in time range)  potassium chloride  (KLOR-CON ) packet 40 mEq ( Oral Automatically Held 04/21/24 2200)  heparin  ADULT infusion 100 units/mL (25000 units/250mL) (850 Units/hr Intravenous New Bag/Given 04/21/24 1020)  sodium chloride  0.9 % bolus 1,000 mL (0 mLs Intravenous Stopped 04/20/24 1712)  ondansetron  (ZOFRAN ) injection 4 mg (4 mg Intravenous Given 04/20/24 1440)  morphine  (PF) 4 MG/ML injection 4 mg (4 mg Intravenous Given 04/20/24 1439)  ipratropium-albuterol (DUONEB) 0.5-2.5 (3) MG/3ML nebulizer solution 3 mL (3 mLs Nebulization Given 04/20/24 1234)  sodium chloride  0.9 % bolus 1,000 mL (1,000 mLs Intravenous New Bag/Given 04/20/24 1714)  oxyCODONE  (Oxy IR/ROXICODONE ) immediate release tablet 5 mg (5 mg Oral Given 04/20/24 1712)  potassium chloride  SA (KLOR-CON  M) CR tablet 40 mEq (40 mEq Oral Given 04/20/24 1833)  LORazepam  (ATIVAN ) injection 0.5 mg (0.5 mg Intravenous Given 04/20/24 2112)  acetaminophen  (TYLENOL ) tablet 1,000 mg (1,000 mg Oral Given 04/21/24 0258)  potassium chloride  10 mEq in 100 mL IVPB (10 mEq Intravenous New Bag/Given 04/21/24 0649)  morphine  (PF) 2 MG/ML injection 2 mg (2 mg Intravenous Given 04/21/24 0324)  heparin  bolus via infusion 4,000 Units (4,000 Units Intravenous Bolus from Bag 04/21/24 1021)    Reassessment after intervention:  pain improved but nausea continues.    I did obtain Additional Historical Information from Mom at bedside.  I decided to review pertinent External Data, and in summary patient last seen in the ED with intractable nausea/vomiting last month.  Patient with 3 CT scans of  the A/P since November of 2024. No complicated findings.    Clinical Laboratory Tests Ordered, included BC with leukocytosis to 20.3.  No SIRS vitals to strongly suggest sepsis.  Suspect  hemoconcentration primarily.  LFTs, bilirubin, lipase normal.  Troponin mildly elevated at 33.  Will continue to follow.  Radiologic Tests Ordered, included CXR. I independently interpreted the images and agree with radiology interpretation.   Cardiac Monitor Tracing which shows NSR.    Social Determinants of Health Risk patient is a non-smoker.   Consult complete with [facility]. Plan for obs admit.   Medical Decision Making: Summary:  Patient presents to the emergency department with vomiting and cough.  Patient describes a more posttussive emesis with respiratory symptoms.  Will start with chest x-ray work.  Patient has also been seen in the ED with nausea vomiting of unclear etiology.  Reassuring CT scans x 3 in the last year in our health system. Plan to defer abdominal imaging for now.   Reevaluation with update and discussion with patient and family at bedside.  Patient continued to feel very nauseated.  No chest pain unless she is coughing.  Plan for continued IV fluids and admit for monitoring. She is in agreement.    Patient's presentation is most consistent with acute presentation with potential threat to life or bodily function.   Disposition: admit  ____________________________________________  FINAL CLINICAL IMPRESSION(S) / ED DIAGNOSES  Final diagnoses:  Nausea and vomiting, unspecified vomiting type  Acute cough    Note:  This document was prepared using Dragon voice recognition software and may include unintentional dictation errors.  Abby Hocking, MD, [facility] Emergency Medicine    Juliahna Wiswell, Shereen Dike, MD 04/21/24 1057

## 2024-04-20 NOTE — Progress Notes (Signed)
 Called by hospitalist team given the elevated troponin.  38 year old patient with Crohn's disease admitted with nausea, vomiting.  She had recent URTI symptoms. Denies any chest pain per Reports of H&P.  EKG obtained shows normal sinus rhythm without any pericarditis or ST-T wave changes. Respiratory pathogen panel negative, beta-hCG negative.  Cardiology is called gait given elevated troponin, first 1 was 33 and went up to864. Per Hospital team patient does not have any chest pain. Patient also had uncontrolled hypertension on admission, blood pressure 189/111 mmHg improved to 161/123 mmHg still diastolic blood pressure is very high.  For which patient got Norvasc .  Working diagnosis here could be myopericarditis versus demand ischemia from hypertensive emergency  Recommendations:  No need for heparin . Continue to trend troponin, EKG. Obtain echocardiogram ina.m. If troponin trends very high >1000, then can consider transfer to [facility] health for further evaluation. Consider cardiac MRI for further evaluation depending on clinical course, given possibility that this could be myopericarditis  Cardiology will see her in a.m. if she remains in [facility] long.

## 2024-04-20 NOTE — ED Notes (Addendum)
 Pt BP has been high all day. Last BP 195/119 Map 142. Pt reports BP is normally high when she is sick. Hx of HTN. Requesting BP meds. Provider notified.

## 2024-04-20 NOTE — Hospital Course (Addendum)
 HPI: Frances Mccormick is a 38 y.o. female with medical history significant of Crohn's disease, depression, menorrhagia presenting to the ED with intractable nausea and vomiting.   Patient reports that she was in her usual state of health until about 3 days ago when she began experiencing a sudden onset cough with posttussive emesis.  States that her cough has been largely nonproductive though sometimes she will cough up small amounts of yellowish phlegm.  Her coughing fits have been very bad to the point where she develops nausea and vomiting thereafter.  She has had multiple bouts of nonbloody nonbilious emesis each day for the past few days.  She does note that she has a young child who she goes to daycare with and is unclear if she caught a viral infection while there.  She denies any fevers, chills, chest pain, palpitations, diarrhea, constipation, urinary changes. She does endorse a tugging sensation in her lower abdomen associated with her coughing fits.   Patient's states that her blood pressure is typically well-controlled though she has noted that it runs higher whenever she experiences episodes of nausea and vomiting such as her current presentation.   ED course: Vital signs stable aside from elevated blood pressure.  CBC with WBC 20 (chronically elevated), hemoglobin 15.9 (chronically elevated).  CMP with glucose 139, normal kidney function.  Respiratory panel negative for COVID, flu, RSV.  Lipase normal.  Beta-hCG negative.  Troponin 33, awaiting repeat.  Urinalysis negative for infection.  UDS positive for THC.  Chest x-ray negative.  Patient given dose of DuoNebs, IV morphine , IV Zofran , 1 L IV fluid bolus.  [facility] hospitalist asked to evaluate patient for admission.  Significant Events: Admitted 04/20/2024 for intractable N/V   Admission Labs: UA LE neg, nitrite neg UDS + for THC WBC 20.3, HgB 15.9, plt 388 Na 138, K 3.6, CO2 of 21, BUN 8, Scr 0.62, glu 139 Lipase 28 Initial troponin  33  Admission Imaging Studies: CXR No active cardiopulmonary disease   Significant Labs: troponin I 33 -> 864 -> 1492 -> 1746 -> 1961   Significant Imaging Studies:   Antibiotic Therapy: Anti-infectives (From admission, onward)    None       Procedures:   Consultants: cardiology

## 2024-04-20 NOTE — ED Triage Notes (Addendum)
 Pt BIB EMS from home due to productive cough for the past 3 days, n/v and chest pain due to excessive coughing felt most when breathing since this morning, and weakness since last night. Pt report not being able to eat due to cough. Pt is diaphoretic on arrival, afebrile. Pt stated being cold, pt is cold to the touch. Hx of crohn's disease.  BP 189/114 HR 71 18 RR CBG 193

## 2024-04-20 NOTE — H&P (Addendum)
 History and Physical    Patient: Frances Mccormick RUE:[id number] DOB: [birth year] DOA: 04/20/2024 DOS: the patient was seen and examined on 04/20/2024 PCP: Ether Hercules, MD  Patient coming from: Home  Chief Complaint:  Chief Complaint  Patient presents with   Cough   Chest Pain   Nausea   HPI: Frances Mccormick is a 38 y.o. female with medical history significant of Crohn's disease, depression, menorrhagia presenting to the ED with intractable nausea and vomiting.  Patient reports that she was in her usual state of health until about 3 days ago when she began experiencing a sudden onset cough with posttussive emesis.  States that her cough has been largely nonproductive though sometimes she will cough up small amounts of yellowish phlegm.  Her coughing fits have been very bad to the point where she develops nausea and vomiting thereafter.  She has had multiple bouts of nonbloody nonbilious emesis each day for the past few days.  She does note that she has a young child who she goes to daycare with and is unclear if she caught a viral infection while there.  She denies any fevers, chills, chest pain, palpitations, diarrhea, constipation, urinary changes. She does endorse a tugging sensation in her lower abdomen associated with her coughing fits.  Patient's states that her blood pressure is typically well-controlled though she has noted that it runs higher whenever she experiences episodes of nausea and vomiting such as her current presentation.  ED course: Vital signs stable aside from elevated blood pressure.  CBC with WBC 20 (chronically elevated), hemoglobin 15.9 (chronically elevated).  CMP with glucose 139, normal kidney function.  Respiratory panel negative for COVID, flu, RSV.  Lipase normal.  Beta-hCG negative.  Troponin 33, awaiting repeat.  Urinalysis negative for infection.  UDS positive for THC.  Chest x-ray negative.  Patient given dose of DuoNebs, IV morphine , IV Zofran , 1 L  IV fluid bolus.  [facility] hospitalist asked to evaluate patient for admission.  Review of Systems: As mentioned in the history of present illness. All other systems reviewed and are negative. Past Medical History:  Diagnosis Date   Advanced maternal age in multigravida, second trimester 04/25/2022   Allergy    Bee venom   Anxiety    Chronic hypertension with superimposed preeclampsia 08/11/2018   Crohn's colitis (HCC)    Depression    Hidradenitis suppurativa    Hypertension    Hyperthyroidism affecting pregnancy in first trimester 01/12/2022   Repeat TFTs in 2nd trimester     Migraine    "a few/year" (07/27/2018)   Non-compliant patient    Preterm delivery after induction of labor 06/20/2022   Preterm premature rupture of membranes (PPROM) with onset of labor within 24 hours of rupture in third trimester, antepartum 06/19/2022   Supervision of high risk pregnancy, antepartum 12/16/2021              Nursing Staff    Provider      Office Location     [facility]    Dating     LMP,  9 week US       [facility] Model    Galerius.Gant ] Traditional  [ ]  Centering  [ ]  Mom-Baby Dyad                Language     English    Anatomy US      Nl 19 weeks      Flu Vaccine      04/25/2022  Genetic/Carrier Screen     NIPS:   LR female  AFP:   *Screen Negative*  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 She has never used smokeless tobacco. She reports that she does not currently use alcohol after a past usage of about 2.0 standard drinks of alcohol per week. She reports current drug use. Drug: Marijuana.  Allergies  Allergen Reactions   Bee Venom Anaphylaxis   Ciprofloxacin  Anaphylaxis    Family History  Problem Relation Age of Onset   Hypertension Mother    Diabetes Mother    Heart disease Mother    Diabetes Father    Hypertension Maternal Grandmother    Stroke Maternal  Grandmother    Hypertension Maternal Grandfather    Diabetes Maternal Grandfather    Breast cancer Maternal Aunt     Prior to Admission medications   Medication Sig Start Date End Date Taking? Authorizing Provider  metoCLOPramide  (REGLAN ) 10 MG tablet Take 1 tablet (10 mg total) by mouth every 8 (eight) hours as needed for nausea or vomiting. 03/02/24   Lyna Sandhoff, PA-C  Norethindrone  Acetate-Ethinyl Estrad-FE (LOESTRIN 24 FE) 1-20 MG-MCG(24) tablet Take 1 tablet by mouth daily. 03/03/24   Ether Hercules, MD  ondansetron  (ZOFRAN -ODT) 4 MG disintegrating tablet Take 1 tablet (4 mg total) by mouth every 8 (eight) hours as needed for nausea or vomiting. 10/26/23   Modena Andes, MD  sertraline  (ZOLOFT ) 50 MG tablet Take 1 tablet (50 mg total) by mouth daily. 03/03/24   Ether Hercules, MD    Physical Exam: Vitals:   04/20/24 0957 04/20/24 1030 04/20/24 1238 04/20/24 1453  BP: (!) 189/111 (!) 174/114 (!) 151/130 (!) 152/110  Pulse: 78 75 73 78  Resp: 18 20 13 15   Temp: 97.8 F (36.6 C)     TempSrc: Oral     SpO2: 100% 100% 100% 100%  Weight:      Height:       Physical Exam Constitutional:      Appearance: Normal appearance. She is obese. She is not ill-appearing.  HENT:     Head: Normocephalic and atraumatic.     Nose: Congestion present.     Mouth/Throat:     Mouth: Mucous membranes are dry.     Pharynx: Oropharynx is clear. No oropharyngeal exudate.  Eyes:     General: No scleral icterus.    Extraocular Movements: Extraocular movements intact.     Conjunctiva/sclera: Conjunctivae normal.     Pupils: Pupils are equal, round, and reactive to light.  Cardiovascular:     Rate and Rhythm: Normal rate and regular rhythm.     Heart sounds: Normal heart sounds. No murmur heard.    No friction rub. No gallop.  Pulmonary:     Effort: Pulmonary effort is normal. No respiratory distress.     Breath sounds: No rhonchi or rales.     Comments: Mild expiratory wheezing on  exam. Saturating well on RA. Abdominal:     General: Bowel sounds are normal. There is no distension.     Palpations: Abdomen is soft.     Tenderness: There is no abdominal tenderness. There is no guarding or rebound.  Musculoskeletal:        General: No swelling. Normal range of motion.  Cervical back: Normal range of motion.  Skin:    General: Skin is warm and dry.  Neurological:     General: No focal deficit present.     Mental Status: She is alert and oriented to person, place, and time.  Psychiatric:        Mood and Affect: Mood normal.        Behavior: Behavior normal.     Data Reviewed:  There are no new results to review at this time.    Latest Ref Rng & Units 04/20/2024   11:23 AM 03/02/2024    6:52 PM 03/01/2024    9:32 AM  CBC  WBC 4.0 - 10.5 K/uL 20.3  14.6  10.1   Hemoglobin 12.0 - 15.0 g/dL 16.1  09.6  04.5   Hematocrit 36.0 - 46.0 % 49.0  51.5  48.7   Platelets 150 - 400 K/uL 388  416  343       Latest Ref Rng & Units 04/20/2024   11:23 AM 03/02/2024    6:52 PM 03/01/2024    9:32 AM  CMP  Glucose 70 - 99 mg/dL 409  811  914   BUN 6 - 20 mg/dL 8  17  8    Creatinine 0.44 - 1.00 mg/dL 7.82  9.56  2.13   Sodium 135 - 145 mmol/L 138  138  135   Potassium 3.5 - 5.1 mmol/L 3.6  3.3  2.8   Chloride 98 - 111 mmol/L 101  99  97   CO2 22 - 32 mmol/L 21  22  21    Calcium  8.9 - 10.3 mg/dL 9.6  9.6  9.4   Total Protein 6.5 - 8.1 g/dL 08.6  9.6  9.1   Total Bilirubin 0.0 - 1.2 mg/dL 0.9  0.6  0.5   Alkaline Phos 38 - 126 U/L 89  69  71   AST 15 - 41 U/L 36  23  22   ALT 0 - 44 U/L 17  18  17     Lipase     Component Value Date/Time   LIPASE 28 04/20/2024 1123   Urinalysis    Component Value Date/Time   COLORURINE STRAW (A) 04/20/2024 1123   APPEARANCEUR CLEAR 04/20/2024 1123   LABSPEC 1.009 04/20/2024 1123   PHURINE 8.0 04/20/2024 1123   GLUCOSEU 150 (A) 04/20/2024 1123   HGBUR MODERATE (A) 04/20/2024 1123   BILIRUBINUR NEGATIVE 04/20/2024 1123   KETONESUR  5 (A) 04/20/2024 1123   PROTEINUR 30 (A) 04/20/2024 1123   UROBILINOGEN 0.2 06/08/2015 0736   NITRITE NEGATIVE 04/20/2024 1123   LEUKOCYTESUR NEGATIVE 04/20/2024 1123    Drugs of Abuse     Component Value Date/Time   LABOPIA NONE DETECTED 04/20/2024 1123   COCAINSCRNUR NONE DETECTED 04/20/2024 1123   LABBENZ NONE DETECTED 04/20/2024 1123   AMPHETMU NONE DETECTED 04/20/2024 1123   THCU POSITIVE (A) 04/20/2024 1123   LABBARB NONE DETECTED 04/20/2024 1123    DG Chest 2 View Result Date: 04/20/2024 CLINICAL DATA:  Chest pain EXAM: CHEST - 2 VIEW COMPARISON:  03/04/2022. FINDINGS: Bilateral lung fields are clear. Bilateral costophrenic angles are clear. Normal cardio-mediastinal silhouette. No acute osseous abnormalities. The soft tissues are within normal limits. IMPRESSION: No active cardiopulmonary disease. Electronically Signed   By: Beula Brunswick M.D.   On: 04/20/2024 13:20    Assessment and Plan: No notes have been filed under this hospital service. Service: Hospitalist  Intractable N/V Cannabis hyperemesis syndrome? Patient with multiple ED  visits over the past 6 months for N/V and generalized abdominal pain. She has underwent 3 CT abd/pel during this time, all of which have been negative for any acute intraabdominal process. She presents today with a 3 day history of intractable N/V which she reports is posttussive in nature. She does have UDS testing positive for THC over the past 9 years. Per PCP notes, her Crohn's disease is well controlled without any immunosuppressive medications. She denies any abdominal tenderness on my exam though states she feels a "tugging sensation" in her lower abdomen when coughing. Suspect possible viral etiology of cough leading to posttussive emesis versus cannabis hyperemesis syndrome. Given prolonged QTc ( ), will use IM tigan for antiemetic and consider prn ativan  if tigan is not working. She is HDS. -s/p 2L IVF, now on mIVF at 125cc/hr for 12  hours -IM tigan q6h prn for nausea, consider prn ativan  if no improvement with tigan -f/u respiratory viral panel -counseled on THC cessation -telemetry -place in observation  Elevated troponin Initial troponin 33, awaiting repeat. EKG with sinus rhythm and without any overt ischemic changes noted. Suspect troponin elevation is not cardiac in origin but instead secondary to GI cause (intractable N/V). She is not having any chest pain currently. -trend troponins until peak -consider cardiology consult/heparin  if troponins rising precipitously  Viral URI Patient complaining of congestion and significant nonproductive cough for the past few days. Her cough has been severe to the point of causing posttussive emesis. Respiratory panel in ED negative for COVID, flu, RSV. -f/u respiratory viral panel -robitussin-dm for cough  Leukocytosis Polycythemia CBC showing chronic leukocytosis and polycythemia. Question if labs are reflective of hemoconcentration given significant dehydration from N/V. She may also have a leukocytosis from possible viral URI. She is otherwise afebrile and HDS. If no improvement on repeat CBC tomorrow after IVF rehydration, can consider JAK-2 testing for polycythemia. -s/p 2L IVF bolus, now on mIVF at 125cc/hr for 12 more hours  HTN Patient reports good BP in the outpatient setting but notes that BP becomes uncontrolled when she experiences acute illness such as this. BP has been ranging in the 150s-190s/110s-120s since arrival. Will start amlodipine  5mg  daily today. Have also advised for RN to place larger cuff on upper arm (recorded BP thus far has been on forearm given lack of appropriate cuff size in ED). -start amlodipine  5mg  daily  -trend BP curve  ADDENDUM: BP remains elevated in the 170s/120s. Have added prn IV labetalol  5mg  for BP >210/110. Also again advised RN to change BP cuff to larger cuff and place on upper arm for more accurate measurements.  Crohn's  disease Chronic and stable per recent PCP note. Not on active treatment for this. She has been referred to GI earlier this month but has not yet established care with them. -f/u with GI in the outpatient setting  Depression -resume home zoloft  50mg  daily   Advance Care Planning:   Code Status: Full Code   Consults: none  Family Communication: updated family at bedside  Severity of Illness: The appropriate patient status for this patient is OBSERVATION. Observation status is judged to be reasonable and necessary in order to provide the required intensity of service to ensure the patient's safety. The patient's presenting symptoms, physical exam findings, and initial radiographic and laboratory data in the context of their medical condition is felt to place them at decreased risk for further clinical deterioration. Furthermore, it is anticipated that the patient will be medically stable for discharge from the  hospital within 2 midnights of admission.   Portions of this note were generated with Scientist, clinical (histocompatibility and immunogenetics). Dictation errors may occur despite best attempts at proofreading.  Author: May Ozment H Laasia Arcos, MD 04/20/2024 3:35 PM  For on call review [email or web address].

## 2024-04-21 ENCOUNTER — Observation Stay (HOSPITAL_COMMUNITY)

## 2024-04-21 ENCOUNTER — Inpatient Hospital Stay (HOSPITAL_COMMUNITY)
Admission: EM | Disposition: A | Discharge status: Home / Self Care | Payer: Self-pay | Source: Home / Self Care | Attending: Internal Medicine

## 2024-04-21 DIAGNOSIS — Z87891 Personal history of nicotine dependence: Secondary | ICD-10-CM | POA: Diagnosis not present

## 2024-04-21 DIAGNOSIS — R051 Acute cough: Secondary | ICD-10-CM | POA: Diagnosis not present

## 2024-04-21 DIAGNOSIS — E876 Hypokalemia: Secondary | ICD-10-CM

## 2024-04-21 DIAGNOSIS — D751 Secondary polycythemia: Secondary | ICD-10-CM | POA: Diagnosis not present

## 2024-04-21 DIAGNOSIS — I409 Acute myocarditis, unspecified: Secondary | ICD-10-CM | POA: Diagnosis not present

## 2024-04-21 DIAGNOSIS — R030 Elevated blood-pressure reading, without diagnosis of hypertension: Secondary | ICD-10-CM

## 2024-04-21 DIAGNOSIS — I10 Essential (primary) hypertension: Secondary | ICD-10-CM | POA: Diagnosis present

## 2024-04-21 DIAGNOSIS — E669 Obesity, unspecified: Secondary | ICD-10-CM

## 2024-04-21 DIAGNOSIS — D72829 Elevated white blood cell count, unspecified: Secondary | ICD-10-CM

## 2024-04-21 DIAGNOSIS — R7989 Other specified abnormal findings of blood chemistry: Secondary | ICD-10-CM

## 2024-04-21 DIAGNOSIS — R112 Nausea with vomiting, unspecified: Secondary | ICD-10-CM | POA: Diagnosis not present

## 2024-04-21 DIAGNOSIS — R9431 Abnormal electrocardiogram [ECG] [EKG]: Secondary | ICD-10-CM | POA: Diagnosis not present

## 2024-04-21 DIAGNOSIS — K5010 Crohn's disease of large intestine without complications: Secondary | ICD-10-CM | POA: Diagnosis present

## 2024-04-21 DIAGNOSIS — E785 Hyperlipidemia, unspecified: Secondary | ICD-10-CM | POA: Diagnosis present

## 2024-04-21 DIAGNOSIS — I5021 Acute systolic (congestive) heart failure: Secondary | ICD-10-CM | POA: Diagnosis not present

## 2024-04-21 DIAGNOSIS — I214 Non-ST elevation (NSTEMI) myocardial infarction: Secondary | ICD-10-CM | POA: Diagnosis not present

## 2024-04-21 DIAGNOSIS — F32A Depression, unspecified: Secondary | ICD-10-CM | POA: Diagnosis present

## 2024-04-21 DIAGNOSIS — I160 Hypertensive urgency: Secondary | ICD-10-CM

## 2024-04-21 DIAGNOSIS — Z6837 Body mass index (BMI) 37.0-37.9, adult: Secondary | ICD-10-CM | POA: Diagnosis not present

## 2024-04-21 DIAGNOSIS — E66812 Obesity, class 2: Secondary | ICD-10-CM | POA: Diagnosis present

## 2024-04-21 DIAGNOSIS — Z1152 Encounter for screening for COVID-19: Secondary | ICD-10-CM | POA: Diagnosis not present

## 2024-04-21 DIAGNOSIS — F1290 Cannabis use, unspecified, uncomplicated: Secondary | ICD-10-CM | POA: Diagnosis present

## 2024-04-21 DIAGNOSIS — Z8249 Family history of ischemic heart disease and other diseases of the circulatory system: Secondary | ICD-10-CM | POA: Diagnosis not present

## 2024-04-21 DIAGNOSIS — R0789 Other chest pain: Secondary | ICD-10-CM | POA: Diagnosis not present

## 2024-04-21 DIAGNOSIS — Z79899 Other long term (current) drug therapy: Secondary | ICD-10-CM | POA: Diagnosis not present

## 2024-04-21 HISTORY — PX: LEFT HEART CATH AND CORONARY ANGIOGRAPHY: CATH118249

## 2024-04-21 LAB — CBC
HCT: 49.4 % — ABNORMAL HIGH (ref 36.0–46.0)
HCT: 51.3 % — ABNORMAL HIGH (ref 36.0–46.0)
Hemoglobin: 16.5 g/dL — ABNORMAL HIGH (ref 12.0–15.0)
Hemoglobin: 16.5 g/dL — ABNORMAL HIGH (ref 12.0–15.0)
MCH: 25.7 pg — ABNORMAL LOW (ref 26.0–34.0)
MCH: 26.1 pg (ref 26.0–34.0)
MCHC: 32.2 g/dL (ref 30.0–36.0)
MCHC: 33.4 g/dL (ref 30.0–36.0)
MCV: 76.9 fL — ABNORMAL LOW (ref 80.0–100.0)
MCV: 81.3 fL (ref 80.0–100.0)
Platelets: 369 10*3/uL (ref 150–400)
Platelets: 425 10*3/uL — ABNORMAL HIGH (ref 150–400)
RBC: 6.31 MIL/uL — ABNORMAL HIGH (ref 3.87–5.11)
RBC: 6.42 MIL/uL — ABNORMAL HIGH (ref 3.87–5.11)
RDW: 17.2 % — ABNORMAL HIGH (ref 11.5–15.5)
RDW: 17.5 % — ABNORMAL HIGH (ref 11.5–15.5)
WBC: 21.4 10*3/uL — ABNORMAL HIGH (ref 4.0–10.5)
WBC: 23.6 10*3/uL — ABNORMAL HIGH (ref 4.0–10.5)
nRBC: 0 % (ref 0.0–0.2)
nRBC: 0 % (ref 0.0–0.2)

## 2024-04-21 LAB — RESPIRATORY PANEL BY PCR

## 2024-04-21 LAB — BASIC METABOLIC PANEL WITH GFR
Anion gap: 15 (ref 5–15)
BUN: 6 mg/dL (ref 6–20)
CO2: 19 mmol/L — ABNORMAL LOW (ref 22–32)
Calcium: 9 mg/dL (ref 8.9–10.3)
Chloride: 101 mmol/L (ref 98–111)
Creatinine, Ser: 0.56 mg/dL (ref 0.44–1.00)
GFR, Estimated: >60 mL/min (ref >60)
Glucose, Bld: 152 mg/dL — ABNORMAL HIGH (ref 70–99)
Potassium: 3.2 mmol/L — ABNORMAL LOW (ref 3.5–5.1)
Sodium: 135 mmol/L (ref 135–145)

## 2024-04-21 LAB — TROPONIN I (HIGH SENSITIVITY)
Troponin I (High Sensitivity): 1746 ng/L (ref <18)
Troponin I (High Sensitivity): 1961 ng/L (ref <18)
Troponin I (High Sensitivity): 1963 ng/L (ref <18)

## 2024-04-21 LAB — ECHOCARDIOGRAM COMPLETE
AR max vel: 2.06 cm2
AV Peak grad: 9.5 mmHg
Ao pk vel: 1.54 m/s
Area-P 1/2: 5.5 cm2
Height: 61 in
MV M vel: 6.08 m/s
MV Peak grad: 147.9 mmHg
MV VTI: 3.9 cm2
S' Lateral: 4.4 cm
Weight: 3200 [oz_av]

## 2024-04-21 LAB — CREATININE, SERUM
Creatinine, Ser: 0.78 mg/dL (ref 0.44–1.00)
GFR, Estimated: >60 mL/min (ref >60)

## 2024-04-21 SURGERY — LEFT HEART CATH AND CORONARY ANGIOGRAPHY
Anesthesia: LOCAL

## 2024-04-21 MED ORDER — POTASSIUM CHLORIDE 10 MEQ/100ML IV SOLN
10.0000 meq | INTRAVENOUS | Status: AC
  Administered 2024-04-21 (×3): 10 meq via INTRAVENOUS
  Filled 2024-04-21 (×3): qty 100

## 2024-04-21 MED ORDER — SODIUM CHLORIDE 0.9 % IV SOLN
INTRAVENOUS | Status: AC | PRN
  Administered 2024-04-21: 250 mL via INTRAVENOUS

## 2024-04-21 MED ORDER — MIDAZOLAM HCL 2 MG/2ML IJ SOLN
INTRAMUSCULAR | Status: AC
  Filled 2024-04-21: qty 2

## 2024-04-21 MED ORDER — SODIUM CHLORIDE 0.9 % WEIGHT BASED INFUSION: 1.0000 mL/kg/h | INTRAVENOUS | Status: DC

## 2024-04-21 MED ORDER — SODIUM CHLORIDE 0.9 % WEIGHT BASED INFUSION: 3.0000 mL/kg/h | INTRAVENOUS | Status: DC

## 2024-04-21 MED ORDER — POTASSIUM CHLORIDE 20 MEQ PO PACK
40.0000 meq | PACK | Freq: Two times a day (BID) | ORAL | Status: DC
  Filled 2024-04-21: qty 2

## 2024-04-21 MED ORDER — IOHEXOL 350 MG/ML SOLN
INTRAVENOUS | Status: DC | PRN
  Administered 2024-04-21: 30 mL

## 2024-04-21 MED ORDER — FENTANYL CITRATE (PF) 100 MCG/2ML IJ SOLN
INTRAMUSCULAR | Status: DC | PRN
  Administered 2024-04-21: 25 ug via INTRAVENOUS

## 2024-04-21 MED ORDER — ACETAMINOPHEN 500 MG PO TABS
1000.0000 mg | ORAL_TABLET | Freq: Once | ORAL | Status: AC
  Administered 2024-04-21: 1000 mg via ORAL
  Filled 2024-04-21: qty 2

## 2024-04-21 MED ORDER — MORPHINE SULFATE (PF) 2 MG/ML IV SOLN
INTRAVENOUS | Status: AC
  Filled 2024-04-21: qty 1

## 2024-04-21 MED ORDER — HEPARIN SODIUM (PORCINE) 1000 UNIT/ML IJ SOLN
INTRAMUSCULAR | Status: DC | PRN
  Administered 2024-04-21: 5000 [IU] via INTRAVENOUS

## 2024-04-21 MED ORDER — LABETALOL HCL 5 MG/ML IV SOLN
INTRAVENOUS | Status: AC
  Filled 2024-04-21: qty 4

## 2024-04-21 MED ORDER — HEPARIN (PORCINE) IN NACL 1000-0.9 UT/500ML-% IV SOLN
INTRAVENOUS | Status: DC | PRN
  Administered 2024-04-21 (×2): 500 mL

## 2024-04-21 MED ORDER — LIDOCAINE HCL (PF) 1 % IJ SOLN
INTRAMUSCULAR | Status: DC | PRN
  Administered 2024-04-21: 2 mL via INTRADERMAL

## 2024-04-21 MED ORDER — SODIUM CHLORIDE 0.9% FLUSH: 3.0000 mL | INTRAVENOUS | Status: DC | PRN

## 2024-04-21 MED ORDER — ACETAMINOPHEN 325 MG PO TABS
650.0000 mg | ORAL_TABLET | ORAL | Status: DC | PRN
  Filled 2024-04-21: qty 2

## 2024-04-21 MED ORDER — VERAPAMIL HCL 2.5 MG/ML IV SOLN
INTRAVENOUS | Status: DC | PRN
  Administered 2024-04-21: 10 mL via INTRA_ARTERIAL

## 2024-04-21 MED ORDER — HEPARIN BOLUS VIA INFUSION
4000.0000 [IU] | Freq: Once | INTRAVENOUS | Status: AC
  Administered 2024-04-21: 4000 [IU] via INTRAVENOUS
  Filled 2024-04-21: qty 4000

## 2024-04-21 MED ORDER — MORPHINE SULFATE (PF) 2 MG/ML IV SOLN
2.0000 mg | Freq: Once | INTRAVENOUS | Status: AC
  Administered 2024-04-21: 2 mg via INTRAVENOUS
  Filled 2024-04-21: qty 1

## 2024-04-21 MED ORDER — MIDAZOLAM HCL 2 MG/2ML IJ SOLN
INTRAMUSCULAR | Status: DC | PRN
  Administered 2024-04-21: 1 mg via INTRAVENOUS

## 2024-04-21 MED ORDER — ASPIRIN 81 MG PO CHEW
324.0000 mg | CHEWABLE_TABLET | Freq: Once | ORAL | Status: DC
  Filled 2024-04-21 (×2): qty 4

## 2024-04-21 MED ORDER — HEPARIN (PORCINE) 25000 UT/250ML-% IV SOLN
850.0000 [IU]/h | INTRAVENOUS | Status: DC
  Administered 2024-04-21: 850 [IU]/h via INTRAVENOUS
  Filled 2024-04-21: qty 250

## 2024-04-21 MED ORDER — SODIUM CHLORIDE 0.9% FLUSH
3.0000 mL | Freq: Two times a day (BID) | INTRAVENOUS | Status: DC
  Administered 2024-04-22 – 2024-04-23 (×3): 3 mL via INTRAVENOUS

## 2024-04-21 MED ORDER — HYDROMORPHONE HCL 1 MG/ML IJ SOLN
0.5000 mg | INTRAMUSCULAR | Status: DC | PRN
  Administered 2024-04-21 – 2024-04-22 (×3): 0.5 mg via INTRAVENOUS
  Filled 2024-04-21 (×3): qty 1

## 2024-04-21 MED ORDER — POTASSIUM CHLORIDE CRYS ER 20 MEQ PO TBCR: 40.0000 meq | EXTENDED_RELEASE_TABLET | ORAL | Status: DC

## 2024-04-21 MED ORDER — POTASSIUM CHLORIDE CRYS ER 20 MEQ PO TBCR
40.0000 meq | EXTENDED_RELEASE_TABLET | ORAL | Status: DC
Start: 2024-04-21 — End: 2024-04-21

## 2024-04-21 MED ORDER — ATORVASTATIN CALCIUM 80 MG PO TABS
80.0000 mg | ORAL_TABLET | Freq: Every day | ORAL | Status: DC
  Administered 2024-04-22 – 2024-04-23 (×2): 80 mg via ORAL
  Filled 2024-04-21 (×2): qty 1

## 2024-04-21 MED ORDER — LORAZEPAM 2 MG/ML IJ SOLN
1.0000 mg | Freq: Once | INTRAMUSCULAR | Status: AC
  Administered 2024-04-21: 1 mg via INTRAVENOUS
  Filled 2024-04-21: qty 1

## 2024-04-21 MED ORDER — SODIUM CHLORIDE 0.9 % IV SOLN: INTRAVENOUS | Status: AC

## 2024-04-21 MED ORDER — HEPARIN SODIUM (PORCINE) 5000 UNIT/ML IJ SOLN
5000.0000 [IU] | Freq: Three times a day (TID) | INTRAMUSCULAR | Status: DC
  Administered 2024-04-21 – 2024-04-23 (×4): 5000 [IU] via SUBCUTANEOUS
  Filled 2024-04-21 (×4): qty 1

## 2024-04-21 MED ORDER — FENTANYL CITRATE (PF) 100 MCG/2ML IJ SOLN
INTRAMUSCULAR | Status: AC
  Filled 2024-04-21: qty 2

## 2024-04-21 MED ORDER — HEPARIN SODIUM (PORCINE) 1000 UNIT/ML IJ SOLN
INTRAMUSCULAR | Status: AC
  Filled 2024-04-21: qty 10

## 2024-04-21 MED ORDER — ONDANSETRON HCL 4 MG/2ML IJ SOLN
INTRAMUSCULAR | Status: AC
  Filled 2024-04-21: qty 2

## 2024-04-21 MED ORDER — SODIUM CHLORIDE 0.9 % IV SOLN: 250.0000 mL | INTRAVENOUS | Status: AC | PRN

## 2024-04-21 MED ORDER — POTASSIUM CHLORIDE 10 MEQ/100ML IV SOLN
10.0000 meq | INTRAVENOUS | Status: AC
  Administered 2024-04-21 – 2024-04-22 (×2): 10 meq via INTRAVENOUS
  Filled 2024-04-21: qty 100

## 2024-04-21 MED ORDER — MORPHINE SULFATE (PF) 2 MG/ML IV SOLN
2.0000 mg | INTRAVENOUS | Status: AC | PRN
  Administered 2024-04-21: 2 mg via INTRAVENOUS

## 2024-04-21 MED ORDER — DICYCLOMINE HCL 10 MG PO CAPS
10.0000 mg | ORAL_CAPSULE | Freq: Four times a day (QID) | ORAL | Status: DC | PRN
  Administered 2024-04-22: 10 mg via ORAL
  Filled 2024-04-21: qty 1

## 2024-04-21 MED ORDER — LIDOCAINE HCL (PF) 1 % IJ SOLN
INTRAMUSCULAR | Status: AC
  Filled 2024-04-21: qty 30

## 2024-04-21 MED ORDER — LABETALOL HCL 5 MG/ML IV SOLN
10.0000 mg | INTRAVENOUS | Status: AC | PRN
  Administered 2024-04-21: 10 mg via INTRAVENOUS

## 2024-04-21 MED ORDER — VERAPAMIL HCL 2.5 MG/ML IV SOLN
INTRAVENOUS | Status: AC
  Filled 2024-04-21: qty 2

## 2024-04-21 MED ORDER — METOPROLOL TARTRATE 25 MG PO TABS
25.0000 mg | ORAL_TABLET | Freq: Two times a day (BID) | ORAL | Status: DC
  Administered 2024-04-21 – 2024-04-22 (×3): 25 mg via ORAL
  Filled 2024-04-21 (×4): qty 1

## 2024-04-21 SURGICAL SUPPLY — 7 items
CATH 5FR JL3.5 JR4 ANG PIG MP (CATHETERS) IMPLANT
CATH LAUNCHER 5F EBU3.0 (CATHETERS) IMPLANT
DEVICE RAD COMP TR BAND LRG (VASCULAR PRODUCTS) IMPLANT
GLIDESHEATH SLEND A-KIT 6F 22G (SHEATH) IMPLANT
GUIDEWIRE INQWIRE 1.5J.035X260 (WIRE) IMPLANT
PACK CARDIAC CATHETERIZATION (CUSTOM PROCEDURE TRAY) ×1 IMPLANT
SET ATX-X65L (MISCELLANEOUS) IMPLANT

## 2024-04-21 NOTE — Progress Notes (Addendum)
   Patient with history of Crohn's disease was complaining of 8/10 abdominal pain prior to undergoing LHC. Yesterday she received 4 mg IV morphine , 5 mg PO oxycodone . Then she received an additional 2 mg IV morphine  this morning around 3 AM. It is currently 12:48 PM and she is scheduled to undergo LHC around 1:30 PM.   Upon examination of the patient she had lower abdominal tenderness. No peritoneal signs present. Vital signs were stable, RR 19, HR 84, BP 125/97. Pain reports 8/10 abdominal pain. Orders placed for 2 mg IV morphine  x 1 dose.   Can re-evaluate post-cath with her primary team for long term management of abdominal pain/IBD.  Jiles Mote, PA-C 04/21/2024 12:51 PM

## 2024-04-21 NOTE — Plan of Care (Signed)

## 2024-04-21 NOTE — Assessment & Plan Note (Signed)
 04-21-2024  no further N/V. Does get nauseated with sudden movement. No dizziness or vertigo.

## 2024-04-21 NOTE — Subjective & Objective (Signed)
 Pt seen and examined. No further intractable N/V. Does feel nauseated when she moves suddenly but is not actively vomiting. Remains on IVF.  Awaiting transfer to [facility] for cardiac evaluation.

## 2024-04-21 NOTE — Progress Notes (Signed)
     Patient Name: Frances Mccormick           DOB: 1986/04/20  MRN: [id number]      Admission Date: 04/20/2024  Attending Provider: Mandy Second, MD  Primary Diagnosis: <principal problem not specified>   Level of care: Telemetry Cardiac   OVERNIGHT PROGRESS REPORT   Elevated troponin --> 33--> 864 --> 1492, trending EKG- NSR with nonspecific ST abnormalities.  Denies active CP or other radiating pains. No distress reported.  Hemodynamically stable, BP 157/113. No acute changes since admission.  Cardiology called by Dr. Consuela Denier earlier tonight. Per cardiology recommendations, if repeat troponin > 1000, transfer to [facility] for further cardiology evaluation. Orders have been placed for transfer to [facility] RN staff to call cardiology for evaluation once pt arrives at [facility].      Maytte Jacot, DNP, ACNPC- AG [facility] [place]

## 2024-04-21 NOTE — Progress Notes (Signed)
 PHARMACY - ANTICOAGULATION CONSULT NOTE  Pharmacy Consult for heparin  Indication: chest pain/ACS  Allergies  Allergen Reactions   Bee Venom Anaphylaxis   Ciprofloxacin  Anaphylaxis    Patient Measurements: Height: 5\' 1"  (154.9 cm) Weight: 90.7 kg (200 lb) IBW/kg (Calculated) : 47.8 HEPARIN  DW (KG): 69  Vital Signs: Temp: 98.2 F (36.8 C) (05/22 0517) Temp Source: Oral (05/22 0517) BP: 125/97 (05/22 0517) Pulse Rate: 84 (05/22 0517)  Labs: Recent Labs    04/20/24 1123 04/20/24 2057 04/20/24 2249 04/21/24 0036 04/21/24 0426  HGB 15.9*  --   --  16.5*  --   HCT 49.0*  --   --  51.3*  --   PLT 388  --   --  369  --   CREATININE 0.62  --   --  0.56  --   TROPONINIHS 33*   < > 1,492* 1,746* 1,961*   < > = values in this interval not displayed.    Estimated Creatinine Clearance: 97.8 mL/min (by C-G formula based on SCr of 0.56 mg/dL).   Medical History: Past Medical History:  Diagnosis Date   Advanced maternal age in multigravida, second trimester 04/25/2022   Allergy    Bee venom   Anxiety    Chronic hypertension with superimposed preeclampsia 08/11/2018   Crohn's colitis (HCC)    Depression    Hidradenitis suppurativa    Hypertension    Hyperthyroidism affecting pregnancy in first trimester 01/12/2022   Repeat TFTs in 2nd trimester     Migraine    "a few/year" (07/27/2018)   Non-compliant patient    Preterm delivery after induction of labor 06/20/2022   Preterm premature rupture of membranes (PPROM) with onset of labor within 24 hours of rupture in third trimester, antepartum 06/19/2022   Supervision of high risk pregnancy, antepartum 12/16/2021              Nursing Staff    Provider      Office Location     [facility]    Dating     LMP,  9 week US       PNC Model    Galerius.Gant ] Traditional  [ ]  Centering  [ ]  Mom-Baby Dyad                Language     English    Anatomy US      Nl 19 weeks      Flu Vaccine      04/25/2022    Genetic/Carrier Screen     NIPS:   LR female   AFP:   *Screen Negative*  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 15 tablet 0 Unknown   Norethindrone  Acetate-Ethinyl Estrad-FE (LOESTRIN 24 FE) 1-20 MG-MCG(24) tablet Take 1 tablet by mouth daily. 84 tablet 4 Past Week   ondansetron  (ZOFRAN -ODT) 4 MG disintegrating tablet Take 1 tablet (4 mg total) by mouth every 8 (eight) hours as needed for nausea or vomiting. 20 tablet 0 Past Month   Pseudoephedrine-APAP-DM (TYLENOL  COLD/FLU DAY PO) Take 1 Dose by mouth every 6 (six) hours as needed (Cough/cold).   Past Week   sertraline  (ZOLOFT ) 50 MG tablet Take 1 tablet (50 mg total)  by mouth daily. 90 tablet 3 Past Week   Scheduled:   amLODipine   5 mg Oral Daily   aspirin  324 mg Oral Once   atorvastatin  80 mg Oral Daily   enoxaparin  (LOVENOX ) injection  40 mg Subcutaneous Q24H   metoprolol  tartrate  25 mg Oral BID   potassium chloride   40 mEq Oral Q4H   sertraline   50 mg Oral QHS    Assessment: 38 YO female presenting 5/21 with N/V and hypertensive urgency. EKG showing minimal ST depressions and patient not complaining of chest pain, however, troponins 33 >> 1961 since admission. Patient not on [facility] PTA but did receive enoxaprin 40mg  for DVT ppx on 5/21 @1709 . Pharmacy consulted for heparin  dosing for NSTEMI.  Today,04/21/24: Scr 0.56--stable Hgb 16.5, plts 369--stable  Cardiology planning transfer to [facility] for cardiac cath Given CBC stable and elevated BMI, will plan to bolus with heparin  regardless of enoxaparin  40mg  given 5/21 PM.  Goal of Therapy:  Heparin  level 0.3-0.7 units/ml Monitor platelets by anticoagulation protocol: Yes   Plan:  Give 4000 units bolus x 1 Start heparin  infusion at 850 units/hr Check anti-Xa level in 6 hours and daily  while on heparin  Continue to monitor H&H and platelets   Roselee Cong, PharmD Clinical Pharmacist  5/22/20259:05 AM

## 2024-04-21 NOTE — Assessment & Plan Note (Signed)
Body mass index is 37.79 kg/m.

## 2024-04-21 NOTE — H&P (View-Only) (Signed)
 Cardiology Consultation   Patient ID: WELTHA CATHY MRN: [id number]; DOB: August 26, 1986  Admit date: 04/20/2024 Date of Consult: 04/21/2024  PCP:  Ether Hercules, MD   [facility] Providers Cardiologist:  None   {   Patient Profile:   Frances Mccormick is a 38 y.o. female with a hx of Crohn's disease, hypertension, who is being seen 04/21/2024 for the evaluation of elevated troponins at the request of Dr. Consuela Denier.  History of Present Illness:   Frances Mccormick reports no prior cardiac history.  She does report a mother who had CABG in her 39s.  Used to be a smoker for about 12 years.  Not a known diabetic.  Admits to [facility] use however has cut back considerably and now only uses maybe once or twice a month.  Not a frequent drinker.  Currently patient being evaluated for elevated troponins [phone], 1746, 1961.  EKG with no acute ST or T wave changes.  However she initially presented with intractable nausea, vomiting and hypertensive urgency with systolics as high as 195.  Vomiting possibly related to Crohn's versus cannabis hyperemesis syndrome.  Overnight fellow consulted, at that time did not recommend to treat as NSTEMI as troponin elevation was not greater than 1000 at that time.  Patient has history of Crohn's disease, and within the past several months she has had numerous ER admissions for intractable nausea, vomiting with multiple CT images of her abdomen with no obvious findings.  She reports this most recent episode was preceded by a coughing fit around 4 AM today in which she was vomiting essentially nonstop every 15 minutes, with some bilious emesis until she arrived to the emergency room.  She has had no symptoms of chest pain, shortness of breath, palpitations, peripheral edema, decrease in exercise capacity.  Reports no pleuritic pain, slight discomfort when she coughs.  No longer vomiting now.  Has reported nonproductive cough and some congestion.  Chest x-ray  negative.  Negative respiratory panel.  UDS positive for THC.  Pregnancy test negative.  Potassium 2.2.  Creatinine 0.56.  WBC chronically elevated 21.4.  RBC 6.31, hemoglobin 16.5, these are also chronically elevated.   Past Medical History:  Diagnosis Date   Advanced maternal age in multigravida, second trimester 04/25/2022   Allergy    Bee venom   Anxiety    Chronic hypertension with superimposed preeclampsia 08/11/2018   Crohn's colitis (HCC)    Depression    Hidradenitis suppurativa    Hypertension    Hyperthyroidism affecting pregnancy in first trimester 01/12/2022   Repeat TFTs in 2nd trimester     Migraine    "a few/year" (07/27/2018)   Non-compliant patient    Preterm delivery after induction of labor 06/20/2022   Preterm premature rupture of membranes (PPROM) with onset of labor within 24 hours of rupture in third trimester, antepartum 06/19/2022   Supervision of high risk pregnancy, antepartum 12/16/2021              Nursing Staff    Provider      Office Location     [facility]    Dating     LMP,  9 week US       [facility] Model    [place]ant ] Traditional  [ ]  Centering  [ ]  Mom-Baby Dyad                Language     English    Anatomy US      Nl 19 weeks  Flu Vaccine      04/25/2022    Genetic/Carrier Screen     NIPS:   LR female  AFP:   *Screen Negative*  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      TDaP Vaccine          Hgb    Past Surgical History:  Procedure Laterality Date   INDUCED ABORTION       Inpatient Medications: Scheduled Meds:  amLODipine   5 mg Oral Daily   enoxaparin  (LOVENOX ) injection  40 mg Subcutaneous Q24H   potassium chloride   40 mEq Oral Q4H   sertraline   50 mg Oral QHS   Continuous Infusions:  PRN Meds: acetaminophen  **OR** acetaminophen , guaiFENesin-dextromethorphan, ipratropium-albuterol, labetalol , trimethobenzamide  Allergies:    Allergies  Allergen Reactions   Bee Venom Anaphylaxis   Ciprofloxacin  Anaphylaxis    Social History:   Social History    Socioeconomic History   Marital status: Single    Spouse name: Not on file   Number of children: Not on file   Years of education: Not on file   Highest education level: Some college, no degree  Occupational History   Not on file  Tobacco Use   Smoking status: Never   Smokeless tobacco: Never  Vaping Use   Vaping status: Never Used  Substance and Sexual Activity   Alcohol use: Not Currently    Alcohol/week: 2.0 standard drinks of alcohol    Types: 1 Glasses of wine, 1 Cans of beer per week    Comment: weekly   Drug use: Yes    Types: Marijuana   Sexual activity: Not Currently    Partners: Male    Birth control/protection: None  Other Topics Concern   Not on file  Social History Narrative   Not on file   Social Drivers of Health   Financial Resource Strain: Medium Risk (03/03/2024)   Overall Financial Resource Strain (CARDIA)    Difficulty of Paying Living Expenses: Somewhat hard  Food Insecurity: No Food Insecurity (04/21/2024)   Hunger Vital Sign    Worried About Running Out of Food in the Last Year: Never true    Ran Out of Food in the Last Year: Never true  Transportation Needs: No Transportation Needs (04/21/2024)   PRAPARE - Administrator, Civil Service (Medical): No    Lack of Transportation (Non-Medical): No  Physical Activity: Insufficiently Active (03/03/2024)   Exercise Vital Sign    Days of Exercise per Week: 2 days    Minutes of Exercise per Session: 30 min  Stress: Stress Concern Present (03/03/2024)   [employer] of Occupational Health - Occupational Stress Questionnaire    Feeling of Stress : To some extent  Social Connections: Moderately Integrated (03/03/2024)   Social Connection and Isolation Panel [NHANES]    Frequency of Communication with Friends and Family: More than three times a week    Frequency of Social Gatherings with Friends and Family: Once a week    Attends Religious Services: More than 4 times per year    Active Member  of [employer] or Organizations: Yes    Attends Banker Meetings: More than 4 times per year    Marital Status: Never married  Intimate Partner Violence: Not At Risk (04/21/2024)   Humiliation, Afraid, Rape, and Kick questionnaire    Fear of Current or Ex-Partner: No    Emotionally Abused: No    Physically Abused: No    Sexually Abused: No    Family History:  Family History  Problem Relation Age of Onset   Hypertension Mother    Diabetes Mother    Heart disease Mother    Diabetes Father    Hypertension Maternal Grandmother    Stroke Maternal Grandmother    Hypertension Maternal Grandfather    Diabetes Maternal Grandfather    Breast cancer Maternal Aunt      ROS:  Please see the history of present illness.  All other ROS reviewed and negative.     Physical Exam/Data:   Vitals:   04/20/24 2148 04/20/24 2353 04/21/24 0247 04/21/24 0517  BP: (!) 161/123 (!) 157/113 (!) 184/133 (!) 125/97  Pulse: 89 95 (!) 105 84  Resp: 18  19   Temp:  98.1 F (36.7 C) 98.1 F (36.7 C) 98.2 F (36.8 C)  TempSrc:  Oral Oral Oral  SpO2:  100% 100% 99%  Weight:      Height:        Intake/Output Summary (Last 24 hours) at 04/21/2024 0836 Last data filed at 04/21/2024 0327 Gross per 24 hour  Intake 541.99 ml  Output --  Net 541.99 ml      04/20/2024    9:48 AM 03/31/2024    2:05 PM 03/03/2024    1:30 PM  Last 3 Weights  Weight (lbs) 200 lb 199 lb 193 lb  Weight (kg) 90.719 kg 90.266 kg 87.544 kg     Body mass index is 37.79 kg/m.  General:  Well nourished, well developed, in no acute distress HEENT: normal Neck: no JVD Vascular: No carotid bruits; Distal pulses 2+ bilaterally Cardiac:  normal S1, S2; RRR; no murmur.  . Lungs:  clear to auscultation bilaterally, no wheezing, rhonchi or rales  Abd: soft, nontender, no hepatomegaly  Ext: no edema Musculoskeletal:  No deformities, BUE and BLE strength normal and equal Skin: warm and dry  Neuro:  CNs 2-12 intact, no focal  abnormalities noted Psych:  Normal affect   EKG:  The EKG was personally reviewed and demonstrates: Sinus rhythm heart rates in the 90s.  Very minimal ST depressions laterally/chronic.  Prominent T waves.  QTc 509. Telemetry:  Telemetry was personally reviewed and demonstrates: Sinus, at times was tachycardic in the 120s but now improved under 100.  Relevant CV Studies:   Laboratory Data:  High Sensitivity Troponin:   Recent Labs  Lab 04/20/24 1123 04/20/24 2057 04/20/24 2249 04/21/24 0036 04/21/24 0426  TROPONINIHS 33* 864* 1,492* 1,746* 1,961*     Chemistry Recent Labs  Lab 04/20/24 1123 04/21/24 0036  NA 138 135  K 3.6 3.2*  CL 101 101  CO2 21* 19*  GLUCOSE 139* 152*  BUN 8 6  CREATININE 0.62 0.56  CALCIUM  9.6 9.0  GFRNONAA >60 >60  ANIONGAP 16* 15    Recent Labs  Lab 04/20/24 1123  PROT 10.0*  ALBUMIN 5.0  AST 36  ALT 17  ALKPHOS 89  BILITOT 0.9   Lipids No results for input(s): "CHOL", "TRIG", "HDL", "LABVLDL", "LDLCALC", "CHOLHDL" in the last 168 hours.  Hematology Recent Labs  Lab 04/20/24 1123 04/21/24 0036  WBC 20.3* 21.4*  RBC 6.08* 6.31*  HGB 15.9* 16.5*  HCT 49.0* 51.3*  MCV 80.6 81.3  MCH 26.2 26.1  MCHC 32.4 32.2  RDW 16.8* 17.5*  PLT 388 369   Thyroid  No results for input(s): "TSH", "FREET4" in the last 168 hours.  BNPNo results for input(s): "BNP", "PROBNP" in the last 168 hours.  DDimer No results for input(s): "DDIMER" in the last  168 hours.   Radiology/Studies:  DG Chest 2 View Result Date: 04/20/2024 CLINICAL DATA:  Chest pain EXAM: CHEST - 2 VIEW COMPARISON:  03/04/2022. FINDINGS: Bilateral lung fields are clear. Bilateral costophrenic angles are clear. Normal cardio-mediastinal silhouette. No acute osseous abnormalities. The soft tissues are within normal limits. IMPRESSION: No active cardiopulmonary disease. Electronically Signed   By: Beula Brunswick M.D.   On: 04/20/2024 13:20     Assessment and Plan:   NSTEMI   Intractable nausea/vomiting In the setting of intractable nausea and vomiting since 4 AM with hypertensive urgency systolics as high as 195.  EKG very minimal ST depressions laterally (chronic).  She has had no chest pain, although her n/v may be interpreted as anginal equivalent but likely secondary to Crohn's disease.  Even though atypical presentation, troponin elevation from 30s to almost 2000 is considerable and warrants further evaluation.  We will treat as NSTEMI. Will plan for cardiac catheterization, start IV heparin , give aspirin 325 mg, start Lopressor  25 mg twice daily, start atorvastatin 80 mg If no evidence of obstructive CAD, may need to plan for cardiac MRI to evaluate myocarditis.  Will order ESR/CRP. Replete potassium aggressively. Obtain echocardiogram, lipid panel Continue to workup secondary causes as she has had multiple ER visits now over the past several months.  Would ask GI to see. [facility] workup to medicine service.   Hypertensive urgency BP much more controlled now that her vomiting has improved, currently in the 120s.  She reports normally well-controlled in the 120s. Amlodipine  5 mg started.  On beta-blocker now.  Leukocytosis/erythrocytosis These have been chronically elevated however I do not see further workup for this, possibly related to her Crohn's.  Consider EPO/hematology workup.  Prolonged QTc 509 on today's EKG.  Grossly replete potassium greater than 4 and magnesium  greater than 2.  Careful with antiemetics and avoid QT prolonging medications.  Informed Consent   Shared Decision Making/Informed Consent The risks [stroke (1 in 1000), death (1 in 1000), kidney failure [usually temporary] (1 in 500), bleeding (1 in 200), allergic reaction [possibly serious] (1 in 200)], benefits (diagnostic support and management of coronary artery disease) and alternatives of a cardiac catheterization were discussed in detail with Ms.  Mccormick and she is willing to proceed.      Risk Assessment/Risk Scores:   TIMI Risk Score for Unstable Angina or Non-ST Elevation MI:   The patient's TIMI risk score is 0, which indicates a 5% risk of all cause mortality, new or recurrent myocardial infarction or need for urgent revascularization in the next 14 days.    For questions or updates, please contact [facility] Please consult [email or web address] for contact info under    Signed, Burnetta Cart, PA-C  04/21/2024 8:36 AM   Patient seen and examined.  Agree with above documentation.  Frances Mccormick is a 38 year old female with history of Crohn's, tobacco use, hypertension here consulted for evaluation of elevated troponins.  She presented with intractable nausea/vomiting.  Denied chest pain or shortness of breath.  Creatinine 0.56, potassium 3.2, WBC 21, hemoglobin 16.5.  Troponin 33 initially, has risen to 1963.  Hypertensive on arrival, BP 170/121 initially, is improved to 125/97.  EKG shows normal sinus rhythm, rate 93, QTc 509, no ST abnormalities. On exam, patient is alert and oriented, rachycardic, regular rhythm, no murmurs, lungs CTAB, no LE edema or JVD.  For her troponin elevation, given her GI symptoms question whether this could represent viral  myocarditis.  She was significantly hypertensive on arrival, though troponin elevated above what we typically see for demand ischemia.  Need to rule out type I NSTEMI.  Despite age, does have risk factors with history of hypertension, tobacco use, and family history.  Will plan LHC today.  Risks and benefits of cardiac catheterization have been discussed with the patient.  These include bleeding, infection, kidney damage, stroke, heart attack, death.  The patient understands these risks and is willing to proceed.  Wendie Hamburg, MD

## 2024-04-21 NOTE — Assessment & Plan Note (Signed)
 04-21-2024 may be from viral URI that started her N/V. Follow WBC. Holding off on ABX for now. UA is negative. CXR is negative.

## 2024-04-21 NOTE — Interval H&P Note (Signed)
 History and Physical Interval Note:  04/21/2024 1:18 PM  Frances Mccormick  has presented today for surgery, with the diagnosis of elevated trop.  The various methods of treatment have been discussed with the patient and family. After consideration of risks, benefits and other options for treatment, the patient has consented to  Procedure(s): LEFT HEART CATH AND CORONARY ANGIOGRAPHY (N/A) as a surgical intervention.  The patient's history has been reviewed, patient examined, no change in status, stable for surgery.  I have reviewed the patient's chart and labs.  Questions were answered to the patient's satisfaction.     Trinidy Masterson J Tracey Stewart

## 2024-04-21 NOTE — Assessment & Plan Note (Signed)
 04-21-2024 continue with IVF. Given acuteness of her illness, defer any hematology workup to outpatient setting for now.

## 2024-04-21 NOTE — Progress Notes (Signed)
 Received from [facility] by [employer]. Arrived w/heparin  infusing at 850 units/hour. C/O nausea , abdominal pain 8/10 and off leg cramps.

## 2024-04-21 NOTE — Assessment & Plan Note (Addendum)
 04-21-2024 troponin I 33 -> 864 -> 1492 -> 1746 -> 1961. Cardiology does not want to start IV heparin  for now. Transfer orders already placed to [facility]. Echo pending.

## 2024-04-21 NOTE — Assessment & Plan Note (Signed)
 04-21-2024 avoiding Qtc prolonging meds. On IM Tigan for N/V

## 2024-04-21 NOTE — Assessment & Plan Note (Signed)
 04-21-2024 continue with norvasc  for now. Monitor BP closely.

## 2024-04-21 NOTE — Consult Note (Addendum)
 Cardiology Consultation   Patient ID: WELTHA CATHY MRN: [id number]; DOB: August 26, 1986  Admit date: 04/20/2024 Date of Consult: 04/21/2024  PCP:  Ether Hercules, MD   [facility] Providers Cardiologist:  None   {   Patient Profile:   Frances Mccormick is a 38 y.o. female with a hx of Crohn's disease, hypertension, who is being seen 04/21/2024 for the evaluation of elevated troponins at the request of Dr. Consuela Denier.  History of Present Illness:   Frances Mccormick reports no prior cardiac history.  She does report a mother who had CABG in her 39s.  Used to be a smoker for about 12 years.  Not a known diabetic.  Admits to [facility] use however has cut back considerably and now only uses maybe once or twice a month.  Not a frequent drinker.  Currently patient being evaluated for elevated troponins [phone], 1746, 1961.  EKG with no acute ST or T wave changes.  However she initially presented with intractable nausea, vomiting and hypertensive urgency with systolics as high as 195.  Vomiting possibly related to Crohn's versus cannabis hyperemesis syndrome.  Overnight fellow consulted, at that time did not recommend to treat as NSTEMI as troponin elevation was not greater than 1000 at that time.  Patient has history of Crohn's disease, and within the past several months she has had numerous ER admissions for intractable nausea, vomiting with multiple CT images of her abdomen with no obvious findings.  She reports this most recent episode was preceded by a coughing fit around 4 AM today in which she was vomiting essentially nonstop every 15 minutes, with some bilious emesis until she arrived to the emergency room.  She has had no symptoms of chest pain, shortness of breath, palpitations, peripheral edema, decrease in exercise capacity.  Reports no pleuritic pain, slight discomfort when she coughs.  No longer vomiting now.  Has reported nonproductive cough and some congestion.  Chest x-ray  negative.  Negative respiratory panel.  UDS positive for THC.  Pregnancy test negative.  Potassium 2.2.  Creatinine 0.56.  WBC chronically elevated 21.4.  RBC 6.31, hemoglobin 16.5, these are also chronically elevated.   Past Medical History:  Diagnosis Date   Advanced maternal age in multigravida, second trimester 04/25/2022   Allergy    Bee venom   Anxiety    Chronic hypertension with superimposed preeclampsia 08/11/2018   Crohn's colitis (HCC)    Depression    Hidradenitis suppurativa    Hypertension    Hyperthyroidism affecting pregnancy in first trimester 01/12/2022   Repeat TFTs in 2nd trimester     Migraine    "a few/year" (07/27/2018)   Non-compliant patient    Preterm delivery after induction of labor 06/20/2022   Preterm premature rupture of membranes (PPROM) with onset of labor within 24 hours of rupture in third trimester, antepartum 06/19/2022   Supervision of high risk pregnancy, antepartum 12/16/2021              Nursing Staff    Provider      Office Location     [facility]    Dating     LMP,  9 week US       [facility] Model    [place]ant ] Traditional  [ ]  Centering  [ ]  Mom-Baby Dyad                Language     English    Anatomy US      Nl 19 weeks  Flu Vaccine      04/25/2022    Genetic/Carrier Screen     NIPS:   LR female  AFP:   *Screen Negative*  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      TDaP Vaccine          Hgb    Past Surgical History:  Procedure Laterality Date   INDUCED ABORTION       Inpatient Medications: Scheduled Meds:  amLODipine   5 mg Oral Daily   enoxaparin  (LOVENOX ) injection  40 mg Subcutaneous Q24H   potassium chloride   40 mEq Oral Q4H   sertraline   50 mg Oral QHS   Continuous Infusions:  PRN Meds: acetaminophen  **OR** acetaminophen , guaiFENesin-dextromethorphan, ipratropium-albuterol, labetalol , trimethobenzamide  Allergies:    Allergies  Allergen Reactions   Bee Venom Anaphylaxis   Ciprofloxacin  Anaphylaxis    Social History:   Social History    Socioeconomic History   Marital status: Single    Spouse name: Not on file   Number of children: Not on file   Years of education: Not on file   Highest education level: Some college, no degree  Occupational History   Not on file  Tobacco Use   Smoking status: Never   Smokeless tobacco: Never  Vaping Use   Vaping status: Never Used  Substance and Sexual Activity   Alcohol use: Not Currently    Alcohol/week: 2.0 standard drinks of alcohol    Types: 1 Glasses of wine, 1 Cans of beer per week    Comment: weekly   Drug use: Yes    Types: Marijuana   Sexual activity: Not Currently    Partners: Male    Birth control/protection: None  Other Topics Concern   Not on file  Social History Narrative   Not on file   Social Drivers of Health   Financial Resource Strain: Medium Risk (03/03/2024)   Overall Financial Resource Strain (CARDIA)    Difficulty of Paying Living Expenses: Somewhat hard  Food Insecurity: No Food Insecurity (04/21/2024)   Hunger Vital Sign    Worried About Running Out of Food in the Last Year: Never true    Ran Out of Food in the Last Year: Never true  Transportation Needs: No Transportation Needs (04/21/2024)   PRAPARE - Administrator, Civil Service (Medical): No    Lack of Transportation (Non-Medical): No  Physical Activity: Insufficiently Active (03/03/2024)   Exercise Vital Sign    Days of Exercise per Week: 2 days    Minutes of Exercise per Session: 30 min  Stress: Stress Concern Present (03/03/2024)   [employer] of Occupational Health - Occupational Stress Questionnaire    Feeling of Stress : To some extent  Social Connections: Moderately Integrated (03/03/2024)   Social Connection and Isolation Panel [NHANES]    Frequency of Communication with Friends and Family: More than three times a week    Frequency of Social Gatherings with Friends and Family: Once a week    Attends Religious Services: More than 4 times per year    Active Member  of [employer] or Organizations: Yes    Attends Banker Meetings: More than 4 times per year    Marital Status: Never married  Intimate Partner Violence: Not At Risk (04/21/2024)   Humiliation, Afraid, Rape, and Kick questionnaire    Fear of Current or Ex-Partner: No    Emotionally Abused: No    Physically Abused: No    Sexually Abused: No    Family History:  Family History  Problem Relation Age of Onset   Hypertension Mother    Diabetes Mother    Heart disease Mother    Diabetes Father    Hypertension Maternal Grandmother    Stroke Maternal Grandmother    Hypertension Maternal Grandfather    Diabetes Maternal Grandfather    Breast cancer Maternal Aunt      ROS:  Please see the history of present illness.  All other ROS reviewed and negative.     Physical Exam/Data:   Vitals:   04/20/24 2148 04/20/24 2353 04/21/24 0247 04/21/24 0517  BP: (!) 161/123 (!) 157/113 (!) 184/133 (!) 125/97  Pulse: 89 95 (!) 105 84  Resp: 18  19   Temp:  98.1 F (36.7 C) 98.1 F (36.7 C) 98.2 F (36.8 C)  TempSrc:  Oral Oral Oral  SpO2:  100% 100% 99%  Weight:      Height:        Intake/Output Summary (Last 24 hours) at 04/21/2024 0836 Last data filed at 04/21/2024 0327 Gross per 24 hour  Intake 541.99 ml  Output --  Net 541.99 ml      04/20/2024    9:48 AM 03/31/2024    2:05 PM 03/03/2024    1:30 PM  Last 3 Weights  Weight (lbs) 200 lb 199 lb 193 lb  Weight (kg) 90.719 kg 90.266 kg 87.544 kg     Body mass index is 37.79 kg/m.  General:  Well nourished, well developed, in no acute distress HEENT: normal Neck: no JVD Vascular: No carotid bruits; Distal pulses 2+ bilaterally Cardiac:  normal S1, S2; RRR; no murmur.  . Lungs:  clear to auscultation bilaterally, no wheezing, rhonchi or rales  Abd: soft, nontender, no hepatomegaly  Ext: no edema Musculoskeletal:  No deformities, BUE and BLE strength normal and equal Skin: warm and dry  Neuro:  CNs 2-12 intact, no focal  abnormalities noted Psych:  Normal affect   EKG:  The EKG was personally reviewed and demonstrates: Sinus rhythm heart rates in the 90s.  Very minimal ST depressions laterally/chronic.  Prominent T waves.  QTc 509. Telemetry:  Telemetry was personally reviewed and demonstrates: Sinus, at times was tachycardic in the 120s but now improved under 100.  Relevant CV Studies:   Laboratory Data:  High Sensitivity Troponin:   Recent Labs  Lab 04/20/24 1123 04/20/24 2057 04/20/24 2249 04/21/24 0036 04/21/24 0426  TROPONINIHS 33* 864* 1,492* 1,746* 1,961*     Chemistry Recent Labs  Lab 04/20/24 1123 04/21/24 0036  NA 138 135  K 3.6 3.2*  CL 101 101  CO2 21* 19*  GLUCOSE 139* 152*  BUN 8 6  CREATININE 0.62 0.56  CALCIUM  9.6 9.0  GFRNONAA >60 >60  ANIONGAP 16* 15    Recent Labs  Lab 04/20/24 1123  PROT 10.0*  ALBUMIN 5.0  AST 36  ALT 17  ALKPHOS 89  BILITOT 0.9   Lipids No results for input(s): "CHOL", "TRIG", "HDL", "LABVLDL", "LDLCALC", "CHOLHDL" in the last 168 hours.  Hematology Recent Labs  Lab 04/20/24 1123 04/21/24 0036  WBC 20.3* 21.4*  RBC 6.08* 6.31*  HGB 15.9* 16.5*  HCT 49.0* 51.3*  MCV 80.6 81.3  MCH 26.2 26.1  MCHC 32.4 32.2  RDW 16.8* 17.5*  PLT 388 369   Thyroid  No results for input(s): "TSH", "FREET4" in the last 168 hours.  BNPNo results for input(s): "BNP", "PROBNP" in the last 168 hours.  DDimer No results for input(s): "DDIMER" in the last  168 hours.   Radiology/Studies:  DG Chest 2 View Result Date: 04/20/2024 CLINICAL DATA:  Chest pain EXAM: CHEST - 2 VIEW COMPARISON:  03/04/2022. FINDINGS: Bilateral lung fields are clear. Bilateral costophrenic angles are clear. Normal cardio-mediastinal silhouette. No acute osseous abnormalities. The soft tissues are within normal limits. IMPRESSION: No active cardiopulmonary disease. Electronically Signed   By: Beula Brunswick M.D.   On: 04/20/2024 13:20     Assessment and Plan:   NSTEMI   Intractable nausea/vomiting In the setting of intractable nausea and vomiting since 4 AM with hypertensive urgency systolics as high as 195.  EKG very minimal ST depressions laterally (chronic).  She has had no chest pain, although her n/v may be interpreted as anginal equivalent but likely secondary to Crohn's disease.  Even though atypical presentation, troponin elevation from 30s to almost 2000 is considerable and warrants further evaluation.  We will treat as NSTEMI. Will plan for cardiac catheterization, start IV heparin , give aspirin 325 mg, start Lopressor  25 mg twice daily, start atorvastatin 80 mg If no evidence of obstructive CAD, may need to plan for cardiac MRI to evaluate myocarditis.  Will order ESR/CRP. Replete potassium aggressively. Obtain echocardiogram, lipid panel Continue to workup secondary causes as she has had multiple ER visits now over the past several months.  Would ask GI to see. [facility] workup to medicine service.   Hypertensive urgency BP much more controlled now that her vomiting has improved, currently in the 120s.  She reports normally well-controlled in the 120s. Amlodipine  5 mg started.  On beta-blocker now.  Leukocytosis/erythrocytosis These have been chronically elevated however I do not see further workup for this, possibly related to her Crohn's.  Consider EPO/hematology workup.  Prolonged QTc 509 on today's EKG.  Grossly replete potassium greater than 4 and magnesium  greater than 2.  Careful with antiemetics and avoid QT prolonging medications.  Informed Consent   Shared Decision Making/Informed Consent The risks [stroke (1 in 1000), death (1 in 1000), kidney failure [usually temporary] (1 in 500), bleeding (1 in 200), allergic reaction [possibly serious] (1 in 200)], benefits (diagnostic support and management of coronary artery disease) and alternatives of a cardiac catheterization were discussed in detail with Ms.  Mccormick and she is willing to proceed.      Risk Assessment/Risk Scores:   TIMI Risk Score for Unstable Angina or Non-ST Elevation MI:   The patient's TIMI risk score is 0, which indicates a 5% risk of all cause mortality, new or recurrent myocardial infarction or need for urgent revascularization in the next 14 days.    For questions or updates, please contact [facility] Please consult [email or web address] for contact info under    Signed, Burnetta Cart, PA-C  04/21/2024 8:36 AM   Patient seen and examined.  Agree with above documentation.  Frances Mccormick is a 38 year old female with history of Crohn's, tobacco use, hypertension here consulted for evaluation of elevated troponins.  She presented with intractable nausea/vomiting.  Denied chest pain or shortness of breath.  Creatinine 0.56, potassium 3.2, WBC 21, hemoglobin 16.5.  Troponin 33 initially, has risen to 1963.  Hypertensive on arrival, BP 170/121 initially, is improved to 125/97.  EKG shows normal sinus rhythm, rate 93, QTc 509, no ST abnormalities. On exam, patient is alert and oriented, rachycardic, regular rhythm, no murmurs, lungs CTAB, no LE edema or JVD.  For her troponin elevation, given her GI symptoms question whether this could represent viral  myocarditis.  She was significantly hypertensive on arrival, though troponin elevated above what we typically see for demand ischemia.  Need to rule out type I NSTEMI.  Despite age, does have risk factors with history of hypertension, tobacco use, and family history.  Will plan LHC today.  Risks and benefits of cardiac catheterization have been discussed with the patient.  These include bleeding, infection, kidney damage, stroke, heart attack, death.  The patient understands these risks and is willing to proceed.  Wendie Hamburg, MD

## 2024-04-21 NOTE — Progress Notes (Signed)
 PROGRESS NOTE    Frances Mccormick  WGN:[id number] DOB: [birth year] DOA: 04/20/2024 PCP: Ether Hercules, MD  Subjective: Pt seen and examined. No further intractable N/V. Does feel nauseated when she moves suddenly but is not actively vomiting. Remains on IVF.  Awaiting transfer to [facility] for cardiac evaluation.   Hospital Course: HPI: Frances Mccormick is a 38 y.o. female with medical history significant of Crohn's disease, depression, menorrhagia presenting to the ED with intractable nausea and vomiting.   Patient reports that she was in her usual state of health until about 3 days ago when she began experiencing a sudden onset cough with posttussive emesis.  States that her cough has been largely nonproductive though sometimes she will cough up small amounts of yellowish phlegm.  Her coughing fits have been very bad to the point where she develops nausea and vomiting thereafter.  She has had multiple bouts of nonbloody nonbilious emesis each day for the past few days.  She does note that she has a young child who she goes to daycare with and is unclear if she caught a viral infection while there.  She denies any fevers, chills, chest pain, palpitations, diarrhea, constipation, urinary changes. She does endorse a tugging sensation in her lower abdomen associated with her coughing fits.   Patient's states that her blood pressure is typically well-controlled though she has noted that it runs higher whenever she experiences episodes of nausea and vomiting such as her current presentation.   ED course: Vital signs stable aside from elevated blood pressure.  CBC with WBC 20 (chronically elevated), hemoglobin 15.9 (chronically elevated).  CMP with glucose 139, normal kidney function.  Respiratory panel negative for COVID, flu, RSV.  Lipase normal.  Beta-hCG negative.  Troponin 33, awaiting repeat.  Urinalysis negative for infection.  UDS positive for THC.  Chest x-ray negative.  Patient given dose of  DuoNebs, IV morphine , IV Zofran , 1 L IV fluid bolus.  [facility] hospitalist asked to evaluate patient for admission.  Significant Events: Admitted 04/20/2024 for intractable N/V   Admission Labs: UA LE neg, nitrite neg UDS + for THC WBC 20.3, HgB 15.9, plt 388 Na 138, K 3.6, CO2 of 21, BUN 8, Scr 0.62, glu 139 Lipase 28 Initial troponin 33  Admission Imaging Studies: CXR No active cardiopulmonary disease   Significant Labs: troponin I 33 -> 864 -> 1492 -> 1746 -> 1961   Significant Imaging Studies:   Antibiotic Therapy: Anti-infectives (From admission, onward)    None       Procedures:   Consultants: cardiology    Assessment and Plan: * Intractable nausea and vomiting 04-21-2024  no further N/V. Does get nauseated with sudden movement. No dizziness or vertigo.  Elevated troponin I level 04-21-2024 troponin I 33 -> 864 -> 1492 -> 1746 -> 1961. Cardiology does not want to start IV heparin  for now. Transfer orders already placed to [facility]. Echo pending.  Elevated BP without diagnosis of hypertension 04-21-2024 continue with norvasc  for now. Monitor BP closely.  Polycythemia 04-21-2024 continue with IVF. Given acuteness of her illness, defer any hematology workup to outpatient setting for now.  Prolonged QT interval 04-21-2024 avoiding Qtc prolonging meds. On IM Tigan for N/V  Leukocytosis 04-21-2024 may be from viral URI that started her N/V. Follow WBC. Holding off on ABX for now. UA is negative. CXR is negative.  Hypokalemia 04-21-2024 replete with oral KCl  Obesity (BMI 35.0-39.9 without comorbidity) Body mass index is 37.79 kg/m.   DVT prophylaxis:  enoxaparin  (LOVENOX ) injection 40 mg Start: 04/20/24 1700    Code Status: Full Code Family Communication: pt is decisional. No family at bedside Disposition Plan: unknown Reason for continuing need for hospitalization: transfer to [facility] when bed available for further cardiac care per request of cardiology  service.  Objective: Vitals:   04/20/24 2148 04/20/24 2353 04/21/24 0247 04/21/24 0517  BP: (!) 161/123 (!) 157/113 (!) 184/133 (!) 125/97  Pulse: 89 95 (!) 105 84  Resp: 18  19   Temp:  98.1 F (36.7 C) 98.1 F (36.7 C) 98.2 F (36.8 C)  TempSrc:  Oral Oral Oral  SpO2:  100% 100% 99%  Weight:      Height:        Intake/Output Summary (Last 24 hours) at 04/21/2024 0742 Last data filed at 04/21/2024 0327 Gross per 24 hour  Intake 541.99 ml  Output --  Net 541.99 ml   Filed Weights   04/20/24 0948  Weight: 90.7 kg    Examination:  Physical Exam Vitals and nursing note reviewed.  Constitutional:      General: She is not in acute distress.    Appearance: She is obese. She is not toxic-appearing or diaphoretic.  HENT:     Head: Normocephalic and atraumatic.     Nose: Nose normal.  Eyes:     General: No scleral icterus. Cardiovascular:     Rate and Rhythm: Normal rate and regular rhythm.  Pulmonary:     Effort: Pulmonary effort is normal. No respiratory distress.     Breath sounds: Normal breath sounds. No wheezing or rales.  Abdominal:     General: Bowel sounds are normal. There is no distension.     Palpations: Abdomen is soft.     Tenderness: There is no abdominal tenderness.  Musculoskeletal:     Right lower leg: No edema.     Left lower leg: No edema.  Skin:    General: Skin is warm and dry.     Capillary Refill: Capillary refill takes less than 2 seconds.  Neurological:     Mental Status: She is alert and oriented to person, place, and time.     Data Reviewed: I have personally reviewed following labs and imaging studies  CBC: Recent Labs  Lab 04/20/24 1123 04/21/24 0036  WBC 20.3* 21.4*  NEUTROABS 18.4*  --   HGB 15.9* 16.5*  HCT 49.0* 51.3*  MCV 80.6 81.3  PLT 388 369   Basic Metabolic Panel: Recent Labs  Lab 04/20/24 1123 04/21/24 0036  NA 138 135  K 3.6 3.2*  CL 101 101  CO2 21* 19*  GLUCOSE 139* 152*  BUN 8 6  CREATININE 0.62  0.56  CALCIUM  9.6 9.0   GFR: Estimated Creatinine Clearance: 97.8 mL/min (by C-G formula based on SCr of 0.56 mg/dL). Liver Function Tests: Recent Labs  Lab 04/20/24 1123  AST 36  ALT 17  ALKPHOS 89  BILITOT 0.9  PROT 10.0*  ALBUMIN 5.0   Recent Labs  Lab 04/20/24 1123  LIPASE 28    Recent Results (from the past 240 hours)  Resp panel by RT-PCR (RSV, Flu A&B, Covid) Urine, Clean Catch     Status: None   Collection Time: 04/20/24 11:23 AM   Specimen: Urine, Clean Catch; Nasal Swab  Result Value Ref Range Status   SARS Coronavirus 2 by RT PCR NEGATIVE NEGATIVE Final    Comment: (NOTE) SARS-CoV-2 target nucleic acids are NOT DETECTED.  The SARS-CoV-2 RNA is generally detectable  in upper respiratory specimens during the acute phase of infection. The lowest concentration of SARS-CoV-2 viral copies this assay can detect is 138 copies/mL. A negative result does not preclude SARS-Cov-2 infection and should not be used as the sole basis for treatment or other patient management decisions. A negative result may occur with  improper specimen collection/handling, submission of specimen other than nasopharyngeal swab, presence of viral mutation(s) within the areas targeted by this assay, and inadequate number of viral copies(<138 copies/mL). A negative result must be combined with clinical observations, patient history, and epidemiological information. The expected result is Negative.  Fact Sheet for Patients:  BloggerCourse.com  Fact Sheet for Healthcare Providers:  SeriousBroker.it  This test is no t yet approved or cleared by the [place]  FDA and  has been authorized for detection and/or diagnosis of SARS-CoV-2 by FDA under an Emergency Use Authorization (EUA). This EUA will remain  in effect (meaning this test can be used) for the duration of the COVID-19 declaration under Section 564(b)(1) of the Act,  21 U.S.C.section 360bbb-3(b)(1), unless the authorization is terminated  or revoked sooner.       Influenza A by PCR NEGATIVE NEGATIVE Final   Influenza B by PCR NEGATIVE NEGATIVE Final    Comment: (NOTE) The Xpert Xpress SARS-CoV-2/FLU/RSV plus assay is intended as an aid in the diagnosis of influenza from Nasopharyngeal swab specimens and should not be used as a sole basis for treatment. Nasal washings and aspirates are unacceptable for Xpert Xpress SARS-CoV-2/FLU/RSV testing.  Fact Sheet for Patients: BloggerCourse.com  Fact Sheet for Healthcare Providers: SeriousBroker.it  This test is not yet approved or cleared by the [place]  FDA and has been authorized for detection and/or diagnosis of SARS-CoV-2 by FDA under an Emergency Use Authorization (EUA). This EUA will remain in effect (meaning this test can be used) for the duration of the COVID-19 declaration under Section 564(b)(1) of the Act, 21 U.S.C. section 360bbb-3(b)(1), unless the authorization is terminated or revoked.     Resp Syncytial Virus by PCR NEGATIVE NEGATIVE Final    Comment: (NOTE) Fact Sheet for Patients: BloggerCourse.com  Fact Sheet for Healthcare Providers: SeriousBroker.it  This test is not yet approved or cleared by the [place]  FDA and has been authorized for detection and/or diagnosis of SARS-CoV-2 by FDA under an Emergency Use Authorization (EUA). This EUA will remain in effect (meaning this test can be used) for the duration of the COVID-19 declaration under Section 564(b)(1) of the Act, 21 U.S.C. section 360bbb-3(b)(1), unless the authorization is terminated or revoked.  Performed at [facility], [address], [place], [place] [address]   Respiratory (~20 pathogens) panel by PCR     Status: None   Collection Time: 04/20/24 11:23 AM   Specimen:  Nasopharyngeal Swab; Respiratory  Result Value Ref Range Status   Adenovirus NOT DETECTED NOT DETECTED Final   Coronavirus 229E NOT DETECTED NOT DETECTED Final    Comment: (NOTE) The Coronavirus on the Respiratory Panel, DOES NOT test for the novel  Coronavirus (2019 nCoV)    Coronavirus HKU1 NOT DETECTED NOT DETECTED Final   Coronavirus NL63 NOT DETECTED NOT DETECTED Final   Coronavirus OC43 NOT DETECTED NOT DETECTED Final   Metapneumovirus NOT DETECTED NOT DETECTED Final   Rhinovirus / Enterovirus NOT DETECTED NOT DETECTED Final   Influenza A NOT DETECTED NOT DETECTED Final   Influenza B NOT DETECTED NOT DETECTED Final   Parainfluenza Virus 1 NOT DETECTED NOT DETECTED Final   Parainfluenza  Virus 2 NOT DETECTED NOT DETECTED Final   Parainfluenza Virus 3 NOT DETECTED NOT DETECTED Final   Parainfluenza Virus 4 NOT DETECTED NOT DETECTED Final   Respiratory Syncytial Virus NOT DETECTED NOT DETECTED Final   Bordetella pertussis NOT DETECTED NOT DETECTED Final   Bordetella Parapertussis NOT DETECTED NOT DETECTED Final   Chlamydophila pneumoniae NOT DETECTED NOT DETECTED Final   Mycoplasma pneumoniae NOT DETECTED NOT DETECTED Final    Comment: Performed at [facility], [address], [place], [place] [address]     Radiology Studies: DG Chest 2 View Result Date: 04/20/2024 CLINICAL DATA:  Chest pain EXAM: CHEST - 2 VIEW COMPARISON:  03/04/2022. FINDINGS: Bilateral lung fields are clear. Bilateral costophrenic angles are clear. Normal cardio-mediastinal silhouette. No acute osseous abnormalities. The soft tissues are within normal limits. IMPRESSION: No active cardiopulmonary disease. Electronically Signed   By: Beula Brunswick M.D.   On: 04/20/2024 13:20    Scheduled Meds:  amLODipine   5 mg Oral Daily   enoxaparin  (LOVENOX ) injection  40 mg Subcutaneous Q24H   potassium chloride   40 mEq Oral Q4H   sertraline   50 mg Oral QHS   Continuous Infusions:  potassium chloride  10 mEq  (04/21/24 0649)     LOS: 0 days   Time spent: 55 minutes  Unk Garb, DO  [facility]  04/21/2024, 7:42 AM

## 2024-04-21 NOTE — Assessment & Plan Note (Signed)
 04-21-2024 replete with oral KCl

## 2024-04-22 ENCOUNTER — Encounter (HOSPITAL_COMMUNITY): Payer: Self-pay | Admitting: Cardiology

## 2024-04-22 ENCOUNTER — Inpatient Hospital Stay (HOSPITAL_COMMUNITY)

## 2024-04-22 DIAGNOSIS — R9431 Abnormal electrocardiogram [ECG] [EKG]: Secondary | ICD-10-CM | POA: Diagnosis not present

## 2024-04-22 DIAGNOSIS — R112 Nausea with vomiting, unspecified: Secondary | ICD-10-CM | POA: Diagnosis not present

## 2024-04-22 DIAGNOSIS — I5021 Acute systolic (congestive) heart failure: Secondary | ICD-10-CM | POA: Diagnosis not present

## 2024-04-22 DIAGNOSIS — R7989 Other specified abnormal findings of blood chemistry: Secondary | ICD-10-CM | POA: Diagnosis not present

## 2024-04-22 LAB — BASIC METABOLIC PANEL WITH GFR
Anion gap: 12 (ref 5–15)
BUN: 8 mg/dL (ref 6–20)
CO2: 22 mmol/L (ref 22–32)
Calcium: 8.8 mg/dL — ABNORMAL LOW (ref 8.9–10.3)
Chloride: 100 mmol/L (ref 98–111)
Creatinine, Ser: 0.88 mg/dL (ref 0.44–1.00)
GFR, Estimated: >60 mL/min (ref >60)
Glucose, Bld: 108 mg/dL — ABNORMAL HIGH (ref 70–99)
Potassium: 3.4 mmol/L — ABNORMAL LOW (ref 3.5–5.1)
Sodium: 134 mmol/L — ABNORMAL LOW (ref 135–145)

## 2024-04-22 LAB — CBC
HCT: 49.9 % — ABNORMAL HIGH (ref 36.0–46.0)
Hemoglobin: 16.9 g/dL — ABNORMAL HIGH (ref 12.0–15.0)
MCH: 26.2 pg (ref 26.0–34.0)
MCHC: 33.9 g/dL (ref 30.0–36.0)
MCV: 77.4 fL — ABNORMAL LOW (ref 80.0–100.0)
Platelets: 418 10*3/uL — ABNORMAL HIGH (ref 150–400)
RBC: 6.45 MIL/uL — ABNORMAL HIGH (ref 3.87–5.11)
RDW: 17.5 % — ABNORMAL HIGH (ref 11.5–15.5)
WBC: 20.9 10*3/uL — ABNORMAL HIGH (ref 4.0–10.5)
nRBC: 0 % (ref 0.0–0.2)

## 2024-04-22 LAB — C-REACTIVE PROTEIN: CRP: 1 mg/dL — ABNORMAL HIGH (ref <1.0)

## 2024-04-22 LAB — SEDIMENTATION RATE: Sed Rate: 8 mm/h (ref 0–22)

## 2024-04-22 MED ORDER — TRIMETHOBENZAMIDE HCL 100 MG/ML IM SOLN
200.0000 mg | Freq: Four times a day (QID) | INTRAMUSCULAR | Status: DC | PRN
  Filled 2024-04-22: qty 2

## 2024-04-22 MED ORDER — LORAZEPAM 2 MG/ML IJ SOLN
1.0000 mg | Freq: Four times a day (QID) | INTRAMUSCULAR | Status: DC | PRN
  Administered 2024-04-22 (×2): 1 mg via INTRAVENOUS
  Filled 2024-04-22 (×2): qty 1

## 2024-04-22 MED ORDER — POTASSIUM CHLORIDE CRYS ER 20 MEQ PO TBCR
40.0000 meq | EXTENDED_RELEASE_TABLET | Freq: Once | ORAL | Status: AC
  Administered 2024-04-22: 40 meq via ORAL
  Filled 2024-04-22: qty 2

## 2024-04-22 MED ORDER — GADOBUTROL 1 MMOL/ML IV SOLN: 10.0000 mL | Freq: Once | INTRAVENOUS | Status: DC | PRN

## 2024-04-22 MED ORDER — GADOBUTROL 1 MMOL/ML IV SOLN
10.0000 mL | Freq: Once | INTRAVENOUS | Status: AC | PRN
  Administered 2024-04-22: 10 mL via INTRAVENOUS

## 2024-04-22 MED ORDER — LORAZEPAM 2 MG/ML IJ SOLN
1.0000 mg | Freq: Once | INTRAMUSCULAR | Status: AC | PRN
  Administered 2024-04-22: 1 mg via INTRAVENOUS
  Filled 2024-04-22: qty 1

## 2024-04-22 MED FILL — Ondansetron HCl Inj 4 MG/2ML (2 MG/ML): INTRAMUSCULAR | Qty: 2 | Status: AC

## 2024-04-22 NOTE — Progress Notes (Signed)
 Patient's BP 86/61. Patient feels sleepy, otherwise asymptomatic. Notfied Dr. [place]; hold parameters were placed on metoprolol .

## 2024-04-22 NOTE — TOC CM/SW Note (Signed)
 Transition of Care [facility]) - Inpatient Brief Assessment   Patient Details  Name: Frances Mccormick MRN: [id number] Date of Birth: 12-May-1986  Transition of Care [facility]) CM/SW Contact:    Cosimo Diones, RN Phone Number: 04/22/2024, 3:19 PM   Clinical Narrative: Patient presented for chest pain and nausea. PTA patient was from home with son. Patient has PCP and gets to appointments without any issues. No home needs identified at this time.   Transition of Care Asessment: Insurance and Status: Insurance coverage has been reviewed Patient has primary care physician: Yes Home environment has been reviewed: reviewed Prior level of function:: independent Prior/Current Home Services: No current home services Social Drivers of Health Review: SDOH reviewed no interventions necessary Readmission risk has been reviewed: Yes Transition of care needs: no transition of care needs at this time

## 2024-04-22 NOTE — Progress Notes (Signed)
 Rounding Note    Patient Name: Frances Mccormick Date of Encounter: 04/22/2024  [facility] Cardiologist: Wendie Hamburg, MD   Subjective   BP 101/72.  Cr 0.88. Potassium 3.4.  Denies any chest pain or dyspnea  Inpatient Medications    Scheduled Meds:  amLODipine   5 mg Oral Daily   aspirin  324 mg Oral Once   atorvastatin  80 mg Oral Daily   heparin   5,000 Units Subcutaneous Q8H   metoprolol  tartrate  25 mg Oral BID   sertraline   50 mg Oral QHS   sodium chloride  flush  3 mL Intravenous Q12H   Continuous Infusions:  sodium chloride      PRN Meds: sodium chloride , acetaminophen , dicyclomine , guaiFENesin-dextromethorphan, HYDROmorphone  (DILAUDID ) injection, ipratropium-albuterol, LORazepam , sodium chloride  flush, trimethobenzamide   Vital Signs    Vitals:   04/21/24 2200 04/21/24 2313 04/22/24 0318 04/22/24 0808  BP: (!) 124/99 101/84 (!) 125/99 101/72  Pulse: (!) 103 94 90   Resp: 20 14 19    Temp: 97.8 F (36.6 C) 97.8 F (36.6 C) 97.6 F (36.4 C) 98 F (36.7 C)  TempSrc: Oral Oral Oral Oral  SpO2:  100% 100%   Weight:      Height:        Intake/Output Summary (Last 24 hours) at 04/22/2024 0831 Last data filed at 04/22/2024 0333 Gross per 24 hour  Intake 542.36 ml  Output --  Net 542.36 ml      04/20/2024    9:48 AM 03/31/2024    2:05 PM 03/03/2024    1:30 PM  Last 3 Weights  Weight (lbs) 200 lb 199 lb 193 lb  Weight (kg) 90.719 kg 90.266 kg 87.544 kg      Telemetry    NSR - Personally Reviewed  ECG    No new ECG - Personally Reviewed  Physical Exam   GEN: No acute distress.   Neck: No JVD Cardiac: RRR, no murmurs, rubs, or gallops.  Respiratory: Clear to auscultation bilaterally. GI: Soft, nontender, non-distended  MS: No edema; No deformity. Neuro:  Nonfocal  Psych: Normal affect   Labs    High Sensitivity Troponin:   Recent Labs  Lab 04/20/24 2057 04/20/24 2249 04/21/24 0036 04/21/24 0426 04/21/24 0714   TROPONINIHS 864* 1,492* 1,746* 1,961* 1,963*     Chemistry Recent Labs  Lab 04/20/24 1123 04/21/24 0036 04/21/24 1605 04/22/24 0621  NA 138 135  --  134*  K 3.6 3.2*  --  3.4*  CL 101 101  --  100  CO2 21* 19*  --  22  GLUCOSE 139* 152*  --  108*  BUN 8 6  --  8  CREATININE 0.62 0.56 0.78 0.88  CALCIUM  9.6 9.0  --  8.8*  PROT 10.0*  --   --   --   ALBUMIN 5.0  --   --   --   AST 36  --   --   --   ALT 17  --   --   --   ALKPHOS 89  --   --   --   BILITOT 0.9  --   --   --   GFRNONAA >60 >60 >60 >60  ANIONGAP 16* 15  --  12    Lipids No results for input(s): "CHOL", "TRIG", "HDL", "LABVLDL", "LDLCALC", "CHOLHDL" in the last 168 hours.  Hematology Recent Labs  Lab 04/21/24 0036 04/21/24 1605 04/22/24 0621  WBC 21.4* 23.6* 20.9*  RBC 6.31* 6.42*  6.45*  HGB 16.5* 16.5* 16.9*  HCT 51.3* 49.4* 49.9*  MCV 81.3 76.9* 77.4*  MCH 26.1 25.7* 26.2  MCHC 32.2 33.4 33.9  RDW 17.5* 17.2* 17.5*  PLT 369 425* 418*   Thyroid  No results for input(s): "TSH", "FREET4" in the last 168 hours.  BNPNo results for input(s): "BNP", "PROBNP" in the last 168 hours.  DDimer No results for input(s): "DDIMER" in the last 168 hours.   Radiology    CARDIAC CATHETERIZATION Result Date: 04/21/2024 Coronary angiography 04/21/2024: LM: Normal LAD: Short vessel, does not reach apex-which is supplied by OMs and RPDA          No significant disease Lcx: No significant disease RCA: Dominant vessel, no significant disease LVEDP normal No significant coronary artery disease noted Consider demand ischemia or myocarditis as the differential Cody Das, MD   ECHOCARDIOGRAM COMPLETE Result Date: 04/21/2024    ECHOCARDIOGRAM REPORT   Patient Name:   Frances Mccormick Frances Date of Exam: 04/21/2024 Medical Rec #:  [id number]        Height:       61.0 in Accession #:    [id number]       Weight:       200.0 lb Date of Birth:  1986-11-25         BSA:          1.889 m Patient Age:    38 years         BP:            125/97 mmHg Patient Gender: F                HR:           102 bpm. Exam Location:  Inpatient Procedure: 2D Echo, Cardiac Doppler and Color Doppler (Both Spectral and Color            Flow Doppler were utilized during procedure). Indications:    Elevated Troponin  History:        Patient has no prior history of Echocardiogram examinations.                 Risk Factors:Hypertension.  Sonographer:    Willey Harrier Referring Phys: [id number] SAGAR H JINWALA IMPRESSIONS  1. Left ventricular ejection fraction, by estimation, is 50 to 55%. The left ventricle has low normal function. Ther is mild hypokinesis of the basal inferior/inferolateral/inferoseptal walls. The left ventricle demonstrates regional wall motion abnormalities (see scoring diagram/findings for description). Left ventricular diastolic parameters are consistent with Grade I diastolic dysfunction (impaired relaxation).  2. Right ventricular systolic function is normal. The right ventricular size is normal.  3. The mitral valve is normal in structure. Mild mitral valve regurgitation. No evidence of mitral stenosis.  4. The aortic valve is normal in structure. Aortic valve regurgitation is not visualized. No aortic stenosis is present.  5. The inferior vena cava is normal in size with greater than 50% respiratory variability, suggesting right atrial pressure of 3 mmHg. FINDINGS  Left Ventricle: Left ventricular ejection fraction, by estimation, is 50 to 55%. The left ventricle has low normal function. The left ventricle demonstrates regional wall motion abnormalities. The left ventricular internal cavity size was normal in size. There is no left ventricular hypertrophy. Left ventricular diastolic parameters are consistent with Grade I diastolic dysfunction (impaired relaxation).  LV Wall Scoring: The inferior wall, basal inferolateral segment, and basal inferoseptal segment are hypokinetic. Right Ventricle: The right ventricular size is normal. No increase in  right ventricular wall thickness. Right ventricular systolic function is normal. Left Atrium: Left atrial size was normal in size. Right Atrium: Right atrial size was normal in size. Pericardium: There is no evidence of pericardial effusion. Mitral Valve: The mitral valve is normal in structure. Mild mitral valve regurgitation. No evidence of mitral valve stenosis. MV peak gradient, 5.1 mmHg. The mean mitral valve gradient is 2.0 mmHg. Tricuspid Valve: The tricuspid valve is normal in structure. Tricuspid valve regurgitation is not demonstrated. No evidence of tricuspid stenosis. Aortic Valve: The aortic valve is normal in structure. Aortic valve regurgitation is not visualized. No aortic stenosis is present. Aortic valve peak gradient measures 9.5 mmHg. Pulmonic Valve: The pulmonic valve was normal in structure. Pulmonic valve regurgitation is not visualized. No evidence of pulmonic stenosis. Aorta: The aortic root is normal in size and structure. Venous: The inferior vena cava is normal in size with greater than 50% respiratory variability, suggesting right atrial pressure of 3 mmHg. IAS/Shunts: No atrial level shunt detected by color flow Doppler.  LEFT VENTRICLE PLAX 2D LVIDd:         4.90 cm   Diastology LVIDs:         4.40 cm   LV e' medial:    6.20 cm/s LV PW:         0.70 cm   LV E/e' medial:  12.8 LV IVS:        0.80 cm   LV e' lateral:   6.42 cm/s LVOT diam:     1.90 cm   LV E/e' lateral: 12.4 LV SV:         56 LV SV Index:   30 LVOT Area:     2.84 cm  RIGHT VENTRICLE             IVC RV Basal diam:  2.50 cm     IVC diam: 1.10 cm RV S prime:     15.60 cm/s TAPSE (M-mode): 2.0 cm LEFT ATRIUM             Index        RIGHT ATRIUM          Index LA Vol (A2C):   43.6 ml 23.08 ml/m  RA Area:     8.08 cm LA Vol (A4C):   36.1 ml 19.11 ml/m  RA Volume:   15.30 ml 8.10 ml/m LA Biplane Vol: 39.8 ml 21.07 ml/m  AORTIC VALVE AV Area (Vmax): 2.06 cm AV Vmax:        154.00 cm/s AV Peak Grad:   9.5 mmHg LVOT Vmax:       112.00 cm/s LVOT Vmean:     78.500 cm/s LVOT VTI:       0.199 m  AORTA Ao Root diam: 2.60 cm Ao Asc diam:  3.50 cm MITRAL VALVE MV Area (PHT): 5.50 cm    SHUNTS MV Area VTI:   3.90 cm    Systemic VTI:  0.20 m MV Peak grad:  5.1 mmHg    Systemic Diam: 1.90 cm MV Mean grad:  2.0 mmHg MV Vmax:       1.13 m/s MV Vmean:      71.4 cm/s MV Decel Time: 138 msec MR Peak grad: 147.9 mmHg MR Vmax:      608.00 cm/s MV E velocity: 79.30 cm/s MV A velocity: 91.70 cm/s MV E/A ratio:  0.86 Aditya Sabharwal Electronically signed by Alwin Baars Signature Date/Time: 04/21/2024/11:09:14 AM    Final    DG Chest  2 View Result Date: 04/20/2024 CLINICAL DATA:  Chest pain EXAM: CHEST - 2 VIEW COMPARISON:  03/04/2022. FINDINGS: Bilateral lung fields are clear. Bilateral costophrenic angles are clear. Normal cardio-mediastinal silhouette. No acute osseous abnormalities. The soft tissues are within normal limits. IMPRESSION: No active cardiopulmonary disease. Electronically Signed   By: Beula Brunswick M.D.   On: 04/20/2024 13:20    Cardiac Studies     Patient Profile     38 y.o. female  with a hx of Crohn's disease, hypertension, who is being seen 04/21/2024 for the evaluation of elevated troponins   Assessment & Plan    MINOCA P/w N/V, denied any chest pain.  Initial troponin 30, peaked at 2000.  Echocardiogram showed EF 50 to 55% but with hypokinesis in inferior/inferolateral/inferoseptal walls.  LHC showed normal coronary arteries.  Could represent myocarditis  Recommend cardiac MRI to evaluate for myocarditis Will order ESR/CRP   Hypertensive urgency BP much more controlled now that her vomiting has improved, currently in the 100s.  She reports normally well-controlled in the 120s. Amlodipine  5 mg ordered but will discontinue as may need GDMT if EF reduced on CMR.  Currently on Lopressor  25 mg twice daily   Leukocytosis/erythrocytosis These have been chronically elevated however I do not see further  workup for this, possibly related to her Crohn's.  Consider EPO/hematology workup.   Prolonged QTc 509 on yesterday's EKG. replete potassium greater than 4 and magnesium  greater than 2.  Careful with antiemetics and avoid QT prolonging medications. Repeat EKG today to monitor  For questions or updates, please contact [facility] Please consult [email or web address] for contact info under        Signed, Wendie Hamburg, MD  04/22/2024, 8:31 AM

## 2024-04-22 NOTE — Progress Notes (Signed)
 PROGRESS NOTE    Frances Mccormick  ZOX:[id number] DOB: 11-May-1986 DOA: 04/20/2024 PCP: Ether Hercules, MD    Brief Narrative:   Frances Mccormick is a 38 y.o. female with past medical history significant for Crohn's disease, depression, HTN, menorrhagia who presented initially to [facility] ED on 04/20/2024 from home via EMS with complaints of chest pain, productive cough, nausea and vomiting; generalized weakness.  Ongoing over the last 3 days but unable to eat due to cough.  Does not endorse yellow sputum.  Multiple bouts of nonbloody/nonbilious emesis over the last few days.  Denies any sick contacts but does have a young child that goes to daycare.  Also reports a "tugging sensation" in her lower abdomen associated with her coughing fits.  Denies fever, no chills, no palpitations, no diarrhea, no urinary symptoms.  Reports that her blood pressure is typically well-controlled although she is noted that it runs higher whenever experiences episodes of nausea and vomiting.  In the ED, temperature 97.8 F, HR 78, RR 18, BP 189/111, SpO2 100% on room air.  WBC 20.3, globin 15.9, platelet count 388.  Sodium 138, potassium 3.6, chloride 101, CO2 21, glucose 139, BUN 8, creatinine 0.62.  AST 36, ALT 17, total Ruben 0.9.  High-sensitivity troponin 33> 864> 1492.  hCG negative.  COVID/influenza/RSV PCR negative.  Urinalysis unrevealing.  UDS positive for THC.  Respiratory viral panel negative.  TRH consulted for admission for further evaluation and management of intractable nausea/vomiting, elevated troponin.  Assessment & Plan:   Elevated troponin likely secondary to type II demand ischemia versus myocarditis Patient presenting with intractable nausea and vomiting, did endorse chest discomfort.  Troponin initially elevated at 33; uptrending to 1961.  Cardiology was consulted and recommended TTE and transferred to [facility] for anticipated need for of left heart catheterization.   TTE with LVEF 50-55%, mild hypokinesis basal inferior/inferior lateral/inferior septal walls, LV demonstrates regional wall motion abnormalities, grade 1 diastolic dysfunction, mild MR, no aortic stenosis, IVC normal in appearance.  Underwent left heart catheterization 5/22 with no significant coronary artery disease noted. -- Cardiology following, appreciate assistance -- ESR 8, CRP 1.0 -- Cardiac MRI: Pending -- Continue monitor on telemetry  Intractable nausea/vomiting likely secondary to cannabinoid hyperemesis syndrome UDS positive for THC.  Noted several hospitalizations for similar episodes, UDS has been positive over the last 9 years for THC. -- Ativan  1 mg every 6 hours as needed nausea/vomiting -- Tigan 200 mg IM every 6 hours as needed nausea/vomiting not relieved with Ativan  -- Bentyl  10 mg p.o. 4 times daily as needed abdominal pain/spasms -- Diet advanced, currently tolerating  Hypokalemia Potassium 3.4, repleted. -- Repeat BMP with magnesium  in a.m.  HTN Metoprolol  tartrate 25 mg p.o. twice daily  Depression -- Zoloft  50 mg p.o. daily  HLD -- Atorvastatin 80 mg p.o. daily  QTc prolongation -- Avoid QTc prolonging medications -- Monitor on telemetry  Obesity, class II Body mass index is 37.79 kg/m.  DVT prophylaxis: heparin  injection 5,000 Units Start: 04/21/24 2200 SCD's Start: 04/21/24 1536    Code Status: Full Code Family Communication: No family present at bedside this morning  Disposition Plan:  Level of care: Telemetry Cardiac Status is: Inpatient Remains inpatient appropriate because: Cardiac MRI pending    Consultants:  Cardiology  Procedures:  TTE Heart catheterization Cardiac MRI: Pending  Antimicrobials:  None   Subjective: Patient seen examined bedside, lying in bed.  Reports nausea much improved, no further vomiting.  Tolerating  liquids.  Awaiting cardiac MRI today.  No other specific complaints, concerns or questions at this time.   Denies headache, no vision changes, no chest pain, no palpitations, no shortness of breath, no abdominal pain, no fever/chills/night sweats, no nausea/vomiting/diarrhea, no focal weakness, no fatigue, no paresthesias.  No acute events overnight per nursing staff.  Objective: Vitals:   04/21/24 2313 04/22/24 0318 04/22/24 0808 04/22/24 1200  BP: 101/84 (!) 125/99 101/72 100/77  Pulse: 94 90 79 84  Resp: 14 19 17    Temp: 97.8 F (36.6 C) 97.6 F (36.4 C) 98 F (36.7 C)   TempSrc: Oral Oral Oral   SpO2: 100% 100% 100%   Weight:      Height:        Intake/Output Summary (Last 24 hours) at 04/22/2024 1236 Last data filed at 04/22/2024 8295 Gross per 24 hour  Intake 542.36 ml  Output --  Net 542.36 ml   Filed Weights   04/20/24 0948  Weight: 90.7 kg    Examination:  Physical Exam: GEN: NAD, alert and oriented x 3, obese HEENT: NCAT, PERRL, EOMI, sclera clear, MMM PULM: CTAB w/o wheezes/crackles, normal respiratory effort, on room air CV: RRR w/o M/G/R GI: abd soft, NTND, NABS, no R/G/M MSK: no peripheral edema, muscle strength globally intact 5/5 bilateral upper/lower extremities NEURO: CN II-XII intact, no focal deficits, sensation to light touch intact PSYCH: normal mood/affect Integumentary: dry/intact, no rashes or wounds    Data Reviewed: I have personally reviewed following labs and imaging studies  CBC: Recent Labs  Lab 04/20/24 1123 04/21/24 0036 04/21/24 1605 04/22/24 0621  WBC 20.3* 21.4* 23.6* 20.9*  NEUTROABS 18.4*  --   --   --   HGB 15.9* 16.5* 16.5* 16.9*  HCT 49.0* 51.3* 49.4* 49.9*  MCV 80.6 81.3 76.9* 77.4*  PLT 388 369 425* 418*   Basic Metabolic Panel: Recent Labs  Lab 04/20/24 1123 04/21/24 0036 04/21/24 1605 04/22/24 0621  NA 138 135  --  134*  K 3.6 3.2*  --  3.4*  CL 101 101  --  100  CO2 21* 19*  --  22  GLUCOSE 139* 152*  --  108*  BUN 8 6  --  8  CREATININE 0.62 0.56 0.78 0.88  CALCIUM  9.6 9.0  --  8.8*   GFR: Estimated  Creatinine Clearance: 88.9 mL/min (by C-G formula based on SCr of 0.88 mg/dL). Liver Function Tests: Recent Labs  Lab 04/20/24 1123  AST 36  ALT 17  ALKPHOS 89  BILITOT 0.9  PROT 10.0*  ALBUMIN 5.0   Recent Labs  Lab 04/20/24 1123  LIPASE 28   No results for input(s): "AMMONIA" in the last 168 hours. Coagulation Profile: No results for input(s): "INR", "PROTIME" in the last 168 hours. Cardiac Enzymes: No results for input(s): "CKTOTAL", "CKMB", "CKMBINDEX", "TROPONINI" in the last 168 hours. BNP (last 3 results) No results for input(s): "PROBNP" in the last 8760 hours. HbA1C: No results for input(s): "HGBA1C" in the last 72 hours. CBG: No results for input(s): "GLUCAP" in the last 168 hours. Lipid Profile: No results for input(s): "CHOL", "HDL", "LDLCALC", "TRIG", "CHOLHDL", "LDLDIRECT" in the last 72 hours. Thyroid  Function Tests: No results for input(s): "TSH", "T4TOTAL", "FREET4", "T3FREE", "THYROIDAB" in the last 72 hours. Anemia Panel: No results for input(s): "VITAMINB12", "FOLATE", "FERRITIN", "TIBC", "IRON ", "RETICCTPCT" in the last 72 hours. Sepsis Labs: No results for input(s): "PROCALCITON", "LATICACIDVEN" in the last 168 hours.  Recent Results (from the past 240 hours)  Resp panel by RT-PCR (RSV, Flu A&B, Covid) Urine, Clean Catch     Status: None   Collection Time: 04/20/24 11:23 AM   Specimen: Urine, Clean Catch; Nasal Swab  Result Value Ref Range Status   SARS Coronavirus 2 by RT PCR NEGATIVE NEGATIVE Final    Comment: (NOTE) SARS-CoV-2 target nucleic acids are NOT DETECTED.  The SARS-CoV-2 RNA is generally detectable in upper respiratory specimens during the acute phase of infection. The lowest concentration of SARS-CoV-2 viral copies this assay can detect is 138 copies/mL. A negative result does not preclude SARS-Cov-2 infection and should not be used as the sole basis for treatment or other patient management decisions. A negative result may occur  with  improper specimen collection/handling, submission of specimen other than nasopharyngeal swab, presence of viral mutation(s) within the areas targeted by this assay, and inadequate number of viral copies(<138 copies/mL). A negative result must be combined with clinical observations, patient history, and epidemiological information. The expected result is Negative.  Fact Sheet for Patients:  BloggerCourse.com  Fact Sheet for Healthcare Providers:  SeriousBroker.it  This test is no t yet approved or cleared by the [place]  FDA and  has been authorized for detection and/or diagnosis of SARS-CoV-2 by FDA under an Emergency Use Authorization (EUA). This EUA will remain  in effect (meaning this test can be used) for the duration of the COVID-19 declaration under Section 564(b)(1) of the Act, 21 U.S.C.section 360bbb-3(b)(1), unless the authorization is terminated  or revoked sooner.       Influenza A by PCR NEGATIVE NEGATIVE Final   Influenza B by PCR NEGATIVE NEGATIVE Final    Comment: (NOTE) The Xpert Xpress SARS-CoV-2/FLU/RSV plus assay is intended as an aid in the diagnosis of influenza from Nasopharyngeal swab specimens and should not be used as a sole basis for treatment. Nasal washings and aspirates are unacceptable for Xpert Xpress SARS-CoV-2/FLU/RSV testing.  Fact Sheet for Patients: BloggerCourse.com  Fact Sheet for Healthcare Providers: SeriousBroker.it  This test is not yet approved or cleared by the [place]  FDA and has been authorized for detection and/or diagnosis of SARS-CoV-2 by FDA under an Emergency Use Authorization (EUA). This EUA will remain in effect (meaning this test can be used) for the duration of the COVID-19 declaration under Section 564(b)(1) of the Act, 21 U.S.C. section 360bbb-3(b)(1), unless the authorization is terminated  or revoked.     Resp Syncytial Virus by PCR NEGATIVE NEGATIVE Final    Comment: (NOTE) Fact Sheet for Patients: BloggerCourse.com  Fact Sheet for Healthcare Providers: SeriousBroker.it  This test is not yet approved or cleared by the [place]  FDA and has been authorized for detection and/or diagnosis of SARS-CoV-2 by FDA under an Emergency Use Authorization (EUA). This EUA will remain in effect (meaning this test can be used) for the duration of the COVID-19 declaration under Section 564(b)(1) of the Act, 21 U.S.C. section 360bbb-3(b)(1), unless the authorization is terminated or revoked.  Performed at [facility], [address], [place], [place] [address]   Respiratory (~20 pathogens) panel by PCR     Status: None   Collection Time: 04/20/24 11:23 AM   Specimen: Nasopharyngeal Swab; Respiratory  Result Value Ref Range Status   Adenovirus NOT DETECTED NOT DETECTED Final   Coronavirus 229E NOT DETECTED NOT DETECTED Final    Comment: (NOTE) The Coronavirus on the Respiratory Panel, DOES NOT test for the novel  Coronavirus (2019 nCoV)    Coronavirus HKU1 NOT DETECTED NOT DETECTED  Final   Coronavirus NL63 NOT DETECTED NOT DETECTED Final   Coronavirus OC43 NOT DETECTED NOT DETECTED Final   Metapneumovirus NOT DETECTED NOT DETECTED Final   Rhinovirus / Enterovirus NOT DETECTED NOT DETECTED Final   Influenza A NOT DETECTED NOT DETECTED Final   Influenza B NOT DETECTED NOT DETECTED Final   Parainfluenza Virus 1 NOT DETECTED NOT DETECTED Final   Parainfluenza Virus 2 NOT DETECTED NOT DETECTED Final   Parainfluenza Virus 3 NOT DETECTED NOT DETECTED Final   Parainfluenza Virus 4 NOT DETECTED NOT DETECTED Final   Respiratory Syncytial Virus NOT DETECTED NOT DETECTED Final   Bordetella pertussis NOT DETECTED NOT DETECTED Final   Bordetella Parapertussis NOT DETECTED NOT DETECTED Final   Chlamydophila  pneumoniae NOT DETECTED NOT DETECTED Final   Mycoplasma pneumoniae NOT DETECTED NOT DETECTED Final    Comment: Performed at [facility], [address] [place], [place] [address]         Radiology Studies: CARDIAC CATHETERIZATION Result Date: 04/21/2024 Coronary angiography 04/21/2024: LM: Normal LAD: Short vessel, does not reach apex-which is supplied by OMs and RPDA          No significant disease Lcx: No significant disease RCA: Dominant vessel, no significant disease LVEDP normal No significant coronary artery disease noted Consider demand ischemia or myocarditis as the differential Cody Das, MD   ECHOCARDIOGRAM COMPLETE Result Date: 04/21/2024    ECHOCARDIOGRAM REPORT   Patient Name:   TASHANTI DALPORTO Livas Date of Exam: 04/21/2024 Medical Rec #:  [id number]        Height:       61.0 in Accession #:    [id number]       Weight:       200.0 lb Date of Birth:  01/12/1986         BSA:          1.889 m Patient Age:    38 years         BP:           125/97 mmHg Patient Gender: F                HR:           102 bpm. Exam Location:  Inpatient Procedure: 2D Echo, Cardiac Doppler and Color Doppler (Both Spectral and Color            Flow Doppler were utilized during procedure). Indications:    Elevated Troponin  History:        Patient has no prior history of Echocardiogram examinations.                 Risk Factors:Hypertension.  Sonographer:    Willey Harrier Referring Phys: [id number] SAGAR H JINWALA IMPRESSIONS  1. Left ventricular ejection fraction, by estimation, is 50 to 55%. The left ventricle has low normal function. Ther is mild hypokinesis of the basal inferior/inferolateral/inferoseptal walls. The left ventricle demonstrates regional wall motion abnormalities (see scoring diagram/findings for description). Left ventricular diastolic parameters are consistent with Grade I diastolic dysfunction (impaired relaxation).  2. Right ventricular systolic function is normal. The right ventricular  size is normal.  3. The mitral valve is normal in structure. Mild mitral valve regurgitation. No evidence of mitral stenosis.  4. The aortic valve is normal in structure. Aortic valve regurgitation is not visualized. No aortic stenosis is present.  5. The inferior vena cava is normal in size with greater than 50% respiratory variability, suggesting right atrial pressure  of 3 mmHg. FINDINGS  Left Ventricle: Left ventricular ejection fraction, by estimation, is 50 to 55%. The left ventricle has low normal function. The left ventricle demonstrates regional wall motion abnormalities. The left ventricular internal cavity size was normal in size. There is no left ventricular hypertrophy. Left ventricular diastolic parameters are consistent with Grade I diastolic dysfunction (impaired relaxation).  LV Wall Scoring: The inferior wall, basal inferolateral segment, and basal inferoseptal segment are hypokinetic. Right Ventricle: The right ventricular size is normal. No increase in right ventricular wall thickness. Right ventricular systolic function is normal. Left Atrium: Left atrial size was normal in size. Right Atrium: Right atrial size was normal in size. Pericardium: There is no evidence of pericardial effusion. Mitral Valve: The mitral valve is normal in structure. Mild mitral valve regurgitation. No evidence of mitral valve stenosis. MV peak gradient, 5.1 mmHg. The mean mitral valve gradient is 2.0 mmHg. Tricuspid Valve: The tricuspid valve is normal in structure. Tricuspid valve regurgitation is not demonstrated. No evidence of tricuspid stenosis. Aortic Valve: The aortic valve is normal in structure. Aortic valve regurgitation is not visualized. No aortic stenosis is present. Aortic valve peak gradient measures 9.5 mmHg. Pulmonic Valve: The pulmonic valve was normal in structure. Pulmonic valve regurgitation is not visualized. No evidence of pulmonic stenosis. Aorta: The aortic root is normal in size and structure.  Venous: The inferior vena cava is normal in size with greater than 50% respiratory variability, suggesting right atrial pressure of 3 mmHg. IAS/Shunts: No atrial level shunt detected by color flow Doppler.  LEFT VENTRICLE PLAX 2D LVIDd:         4.90 cm   Diastology LVIDs:         4.40 cm   LV e' medial:    6.20 cm/s LV PW:         0.70 cm   LV E/e' medial:  12.8 LV IVS:        0.80 cm   LV e' lateral:   6.42 cm/s LVOT diam:     1.90 cm   LV E/e' lateral: 12.4 LV SV:         56 LV SV Index:   30 LVOT Area:     2.84 cm  RIGHT VENTRICLE             IVC RV Basal diam:  2.50 cm     IVC diam: 1.10 cm RV S prime:     15.60 cm/s TAPSE (M-mode): 2.0 cm LEFT ATRIUM             Index        RIGHT ATRIUM          Index LA Vol (A2C):   43.6 ml 23.08 ml/m  RA Area:     8.08 cm LA Vol (A4C):   36.1 ml 19.11 ml/m  RA Volume:   15.30 ml 8.10 ml/m LA Biplane Vol: 39.8 ml 21.07 ml/m  AORTIC VALVE AV Area (Vmax): 2.06 cm AV Vmax:        154.00 cm/s AV Peak Grad:   9.5 mmHg LVOT Vmax:      112.00 cm/s LVOT Vmean:     78.500 cm/s LVOT VTI:       0.199 m  AORTA Ao Root diam: 2.60 cm Ao Asc diam:  3.50 cm MITRAL VALVE MV Area (PHT): 5.50 cm    SHUNTS MV Area VTI:   3.90 cm    Systemic VTI:  0.20 m MV Peak grad:  5.1 mmHg    Systemic Diam: 1.90 cm MV Mean grad:  2.0 mmHg MV Vmax:       1.13 m/s MV Vmean:      71.4 cm/s MV Decel Time: 138 msec MR Peak grad: 147.9 mmHg MR Vmax:      608.00 cm/s MV E velocity: 79.30 cm/s MV A velocity: 91.70 cm/s MV E/A ratio:  0.86 Aditya Sabharwal Electronically signed by Alwin Baars Signature Date/Time: 04/21/2024/11:09:14 AM    Final    DG Chest 2 View Result Date: 04/20/2024 CLINICAL DATA:  Chest pain EXAM: CHEST - 2 VIEW COMPARISON:  03/04/2022. FINDINGS: Bilateral lung fields are clear. Bilateral costophrenic angles are clear. Normal cardio-mediastinal silhouette. No acute osseous abnormalities. The soft tissues are within normal limits. IMPRESSION: No active cardiopulmonary disease.  Electronically Signed   By: Beula Brunswick M.D.   On: 04/20/2024 13:20        Scheduled Meds:  aspirin  324 mg Oral Once   atorvastatin  80 mg Oral Daily   heparin   5,000 Units Subcutaneous Q8H   metoprolol  tartrate  25 mg Oral BID   sertraline   50 mg Oral QHS   sodium chloride  flush  3 mL Intravenous Q12H   Continuous Infusions:  sodium chloride        LOS: 1 day    Time spent: 50 minutes spent on 04/22/2024 caring for this patient face-to-face including chart review, ordering labs/tests, documenting, discussion with nursing staff, consultants, updating family and interview/physical exam    Rema Care [place], DO [facility] Available via Epic secure chat 7am-7pm After these hours, please refer to coverage provider listed on [email or web address] 04/22/2024, 12:36 PM

## 2024-04-23 DIAGNOSIS — I5021 Acute systolic (congestive) heart failure: Secondary | ICD-10-CM

## 2024-04-23 DIAGNOSIS — R112 Nausea with vomiting, unspecified: Secondary | ICD-10-CM | POA: Diagnosis not present

## 2024-04-23 DIAGNOSIS — R9431 Abnormal electrocardiogram [ECG] [EKG]: Secondary | ICD-10-CM | POA: Diagnosis not present

## 2024-04-23 DIAGNOSIS — I409 Acute myocarditis, unspecified: Principal | ICD-10-CM

## 2024-04-23 LAB — CBC
HCT: 46.2 % — ABNORMAL HIGH (ref 36.0–46.0)
Hemoglobin: 15.6 g/dL — ABNORMAL HIGH (ref 12.0–15.0)
MCH: 26.3 pg (ref 26.0–34.0)
MCHC: 33.8 g/dL (ref 30.0–36.0)
MCV: 77.8 fL — ABNORMAL LOW (ref 80.0–100.0)
Platelets: 366 10*3/uL (ref 150–400)
RBC: 5.94 MIL/uL — ABNORMAL HIGH (ref 3.87–5.11)
RDW: 16.4 % — ABNORMAL HIGH (ref 11.5–15.5)
WBC: 13.9 10*3/uL — ABNORMAL HIGH (ref 4.0–10.5)
nRBC: 0 % (ref 0.0–0.2)

## 2024-04-23 LAB — BASIC METABOLIC PANEL WITH GFR
Anion gap: 10 (ref 5–15)
BUN: 19 mg/dL (ref 6–20)
CO2: 19 mmol/L — ABNORMAL LOW (ref 22–32)
Calcium: 8.4 mg/dL — ABNORMAL LOW (ref 8.9–10.3)
Chloride: 104 mmol/L (ref 98–111)
Creatinine, Ser: 1.01 mg/dL — ABNORMAL HIGH (ref 0.44–1.00)
GFR, Estimated: >60 mL/min (ref >60)
Glucose, Bld: 90 mg/dL (ref 70–99)
Potassium: 3.7 mmol/L (ref 3.5–5.1)
Sodium: 133 mmol/L — ABNORMAL LOW (ref 135–145)

## 2024-04-23 LAB — MAGNESIUM: Magnesium: 2.3 mg/dL (ref 1.7–2.4)

## 2024-04-23 MED ORDER — METOPROLOL SUCCINATE ER 25 MG PO TB24: 25.0000 mg | ORAL_TABLET | Freq: Every day | ORAL | 0 refills | Status: DC

## 2024-04-23 MED ORDER — LORAZEPAM 1 MG PO TABS: 1.0000 mg | ORAL_TABLET | Freq: Three times a day (TID) | ORAL | 0 refills | Status: DC | PRN

## 2024-04-23 MED ORDER — METOPROLOL SUCCINATE ER 25 MG PO TB24
25.0000 mg | ORAL_TABLET | Freq: Every day | ORAL | Status: DC
  Administered 2024-04-23: 25 mg via ORAL
  Filled 2024-04-23: qty 1

## 2024-04-23 NOTE — Discharge Summary (Signed)
 Physician Discharge Summary  Frances Mccormick:[id number] DOB: 1986/11/05 DOA: 04/20/2024  PCP: Ether Hercules, MD  Admit date: 04/20/2024 Discharge date: 04/23/2024  Admitted From: Home Disposition: Home  Recommendations for Outpatient Follow-up:  Follow up with PCP in 1-2 weeks Follow-up with cardiology outpatient Started on metoprolol  succinate 25 mg p.o. daily Holding home Zoloft  due to QTc prolongation  Home Health: No Equipment/Devices: None  Discharge Condition: Stable CODE STATUS: Full code Diet recommendation: Heart healthy diet  History of present illness:  Frances Mccormick is a 38 y.o. female with past medical history significant for Crohn's disease, depression, HTN, menorrhagia who presented initially to [facility] ED on 04/20/2024 from home via EMS with complaints of chest pain, productive cough, nausea and vomiting; generalized weakness.  Ongoing over the last 3 days but unable to eat due to cough.  Does not endorse yellow sputum.  Multiple bouts of nonbloody/nonbilious emesis over the last few days.  Denies any sick contacts but does have a young child that goes to daycare.  Also reports a "tugging sensation" in her lower abdomen associated with her coughing fits.  Denies fever, no chills, no palpitations, no diarrhea, no urinary symptoms.  Reports that her blood pressure is typically well-controlled although she is noted that it runs higher whenever experiences episodes of nausea and vomiting.   In the ED, temperature 97.8 F, HR 78, RR 18, BP 189/111, SpO2 100% on room air.  WBC 20.3, globin 15.9, platelet count 388.  Sodium 138, potassium 3.6, chloride 101, CO2 21, glucose 139, BUN 8, creatinine 0.62.  AST 36, ALT 17, total Ruben 0.9.  High-sensitivity troponin 33> 864> 1492.  hCG negative.  COVID/influenza/RSV PCR negative.  Urinalysis unrevealing.  UDS positive for THC.  Respiratory viral panel negative.  TRH consulted for admission for further  evaluation and management of intractable nausea/vomiting, elevated troponin.  Hospital course:  Acute myocarditis Patient presenting with intractable nausea and vomiting, did endorse chest discomfort.  Troponin initially elevated at 33; uptrending to 1961.  Cardiology was consulted and recommended TTE and transferred to [facility] for anticipated need for of left heart catheterization.  TTE with LVEF 50-55%, mild hypokinesis basal inferior/inferior lateral/inferior septal walls, LV demonstrates regional wall motion abnormalities, grade 1 diastolic dysfunction, mild MR, no aortic stenosis, IVC normal in appearance.  Underwent left heart catheterization 5/22 with no significant coronary artery disease noted.  Underwent cardiac MRI with findings consistent with acute myocarditis.  Started on metoprolol  succinate 25 mg p.o. daily.  Outpatient follow-up with cardiology.   Intractable nausea/vomiting likely secondary to cannabinoid hyperemesis syndrome UDS positive for THC.  Noted several hospitalizations for similar episodes, UDS has been positive over the last 9 years for [facility].  Patient was started on as needed Tigan, Ativan  given prolonged QTc with improvement of symptoms.  Diet was slowly advanced with toleration.  Outpatient follow-up with PCP.   Hypokalemia Repleted during hospitalization.   HTN Metoprolol  succinate 25 mg p.o. daily   Depression Discontinue Zoloft  for now due to QTc prolongation   QTc prolongation Avoid QTc prolonging medications, Zoloft  held.   Obesity, class II Body mass index is 37.79 kg/m.  Discharge Diagnoses:  Principal Problem:   Intractable nausea and vomiting Active Problems:   Elevated troponin I level   Hypokalemia   Leukocytosis   Prolonged QT interval   Polycythemia   Elevated BP without diagnosis of hypertension   Obesity (BMI 35.0-39.9 without comorbidity)   Non-ST elevation (NSTEMI) myocardial infarction (  HCC)   Elevated troponin   Acute  myocarditis   Acute systolic heart failure [facility])    Discharge Instructions  Discharge Instructions     AMB referral to Phase II Cardiac Rehabilitation   Complete by: As directed    Diagnosis: NSTEMI   After initial evaluation and assessments completed: Virtual Based Care may be provided alone or in conjunction with Phase 2 Cardiac Rehab based on patient barriers.: Yes   Intensive Cardiac Rehabilitation (ICR) [facility] location only OR Traditional Cardiac Rehabilitation (TCR) *If criteria for ICR are not met will enroll in TCR [facility] only): Yes   Call MD for:  difficulty breathing, headache or visual disturbances   Complete by: As directed    Call MD for:  extreme fatigue   Complete by: As directed    Call MD for:  persistant dizziness or light-headedness   Complete by: As directed    Call MD for:  persistant nausea and vomiting   Complete by: As directed    Call MD for:  severe uncontrolled pain   Complete by: As directed    Call MD for:  temperature >100.4   Complete by: As directed    Diet - low sodium heart healthy   Complete by: As directed    Increase activity slowly   Complete by: As directed       Allergies as of 04/23/2024       Reactions   Bee Venom Anaphylaxis   Ciprofloxacin  Anaphylaxis        Medication List     PAUSE taking these medications    sertraline  50 MG tablet Wait to take this until your doctor or other care provider tells you to start again. Commonly known as: ZOLOFT  Take 1 tablet (50 mg total) by mouth daily.       STOP taking these medications    ondansetron  4 MG disintegrating tablet Commonly known as: ZOFRAN -ODT       TAKE these medications    LORazepam  1 MG tablet Commonly known as: Ativan  Take 1 tablet (1 mg total) by mouth every 8 (eight) hours as needed for anxiety (Nausea).   metoCLOPramide  10 MG tablet Commonly known as: REGLAN  Take 1 tablet (10 mg total) by mouth every 8 (eight) hours as needed for nausea or vomiting.    metoprolol  succinate 25 MG 24 hr tablet Commonly known as: TOPROL -XL Take 1 tablet (25 mg total) by mouth daily. Start taking on: Apr 24, 2024   Norethindrone  Acetate-Ethinyl Estrad-FE 1-20 MG-MCG(24) tablet Commonly known as: LOESTRIN 24 FE Take 1 tablet by mouth daily.   TYLENOL  COLD/FLU DAY PO Take 1 Dose by mouth every 6 (six) hours as needed (Cough/cold).        Follow-up Information     [facility] A Dept of [employer] [facility] [employer]. Schedule an appointment as soon as possible for a visit.   Specialty: Cardiology Contact information: [address] [place]  [address] [phone]        Ether Hercules, MD. Schedule an appointment as soon as possible for a visit.   Specialty: Internal Medicine Contact information: [address] [place] [address] [phone]         Abigail Abler, MD .   Specialty: Obstetrics and Gynecology Contact information: [address] [place] [address] [phone]                Allergies  Allergen Reactions   Bee Venom Anaphylaxis  Ciprofloxacin  Anaphylaxis    Consultations: Cardiology   Procedures/Studies: MR CARDIAC MORPHOLOGY W WO CONTRAST Result Date: 04/22/2024 CLINICAL DATA:  30F with HTN p/w MINOCA. Peak troponin 2000. LHC unremarkable. EXAM: CARDIAC MRI TECHNIQUE: The patient was scanned on a 1.5 Tesla Siemens magnet. A dedicated cardiac coil was used. Functional imaging was done using Fiesta sequences. 2,3, and 4 chamber views were done to assess for RWMA's. Modified Simpson's rule using a short axis stack was used to calculate an ejection fraction on a dedicated work [employer], [employer]. The patient received 10 cc of Gadavist. After 10 minutes inversion recovery sequences were used to assess for infiltration and scar tissue. Phase contrast velocity mapping was performed above the aortic and pulmonic valves CONTRAST:  10 cc  of Gadavist FINDINGS: Left  ventricle: -Normal size -Mild hypertrophy -Mild systolic dysfunction -Elevated myocardial T1/T2 levels, suggesting myocardial edema. Unable to calculate ECV as post contrast T1 mapping was not done -Basal to mid septal midwall and basal lateral midwall LGE LV EF:  45% (Normal 52-79%) Absolute volumes: LV EDV: (Normal 78-167 mL) LV ESV: 63mL (Normal 21-64 mL) LV SV: 51mL (Normal 52-114 mL) CO: 4.3L/min (Normal 2.7-6.3 L/min) Indexed volumes: LV EDV: 59mL/sq-m (Normal 50-96 mL/sq-m) LV ESV: 30mL/sq-m (Normal 10-40 mL/sq-m) LV SV: 51mL/sq-m (Normal 33-64 mL/sq-m) CI: 2.2L/min/sq-m (Normal 1.9-3.9 L/min/sq-m) Right ventricle: Normal size and systolic function RV EF: 53% (Normal 52-80%) Absolute volumes: RV EDV: 96mL (Normal 79-175 mL) RV ESV: 45mL (Normal 13-75 mL) RV SV: 51mL (Normal 56-110 mL) CO: 4.3L/min (Normal 2.7-6 L/min) Indexed volumes: RV EDV: 51mL/sq-m (Normal 51-97 mL/sq-m) RV ESV: 61mL/sq-m (Normal 9-42 mL/sq-m) RV SV: 36mL/sq-m (Normal 35-61 mL/sq-m) CI: 2.2L/min/sq-m (Normal 1.8-3.8 L/min/sq-m) Left atrium: Normal size Right atrium: Normal size Mitral valve: Trivial regurgitation Aortic valve: Trivial regurgitation Tricuspid valve: Trivial regurgitation Pulmonic valve: Trivial regurgitation Aorta: Normal proximal ascending aorta Pericardium: Normal IMPRESSION: 1. Meets criteria for acute myocarditis, with regional elevations in myocardial T1/T2 values, and LGE pattern of basal to mid septal and basal lateral midwall LGE. 2. Normal LV size, mild hypertrophy, and mild systolic dysfunction (EF 45%) 3.  Normal RV size and systolic function (EF 53%) Electronically Signed   By: Carson Clara M.D.   On: 04/22/2024 17:09   MR CARDIAC VELOCITY FLOW MAP Result Date: 04/22/2024 CLINICAL DATA:  30F with HTN p/w MINOCA. Peak troponin 2000. LHC unremarkable. EXAM: CARDIAC MRI TECHNIQUE: The patient was scanned on a 1.5 Tesla Siemens magnet. A dedicated cardiac coil was used. Functional imaging was done  using Fiesta sequences. 2,3, and 4 chamber views were done to assess for RWMA's. Modified Simpson's rule using a short axis stack was used to calculate an ejection fraction on a dedicated work [employer] officer, trade [employer]. The patient received 10 cc of Gadavist. After 10 minutes inversion recovery sequences were used to assess for infiltration and scar tissue. Phase contrast velocity mapping was performed above the aortic and pulmonic valves CONTRAST:  10 cc  of Gadavist FINDINGS: Left ventricle: -Normal size -Mild hypertrophy -Mild systolic dysfunction -Elevated myocardial T1/T2 levels, suggesting myocardial edema. Unable to calculate ECV as post contrast T1 mapping was not done -Basal to mid septal midwall and basal lateral midwall LGE LV EF:  45% (Normal 52-79%) Absolute volumes: LV EDV: (Normal 78-167 mL) LV ESV: 63mL (Normal 21-64 mL) LV SV: 51mL (Normal 52-114 mL) CO: 4.3L/min (Normal 2.7-6.3 L/min) Indexed volumes: LV EDV: 29mL/sq-m (Normal 50-96 mL/sq-m) LV ESV: 89mL/sq-m (Normal 10-40 mL/sq-m) LV SV: 32mL/sq-m (Normal  33-64 mL/sq-m) CI: 2.2L/min/sq-m (Normal 1.9-3.9 L/min/sq-m) Right ventricle: Normal size and systolic function RV EF: 53% (Normal 52-80%) Absolute volumes: RV EDV: 96mL (Normal 79-175 mL) RV ESV: 45mL (Normal 13-75 mL) RV SV: 51mL (Normal 56-110 mL) CO: 4.3L/min (Normal 2.7-6 L/min) Indexed volumes: RV EDV: 60mL/sq-m (Normal 51-97 mL/sq-m) RV ESV: 34mL/sq-m (Normal 9-42 mL/sq-m) RV SV: 16mL/sq-m (Normal 35-61 mL/sq-m) CI: 2.2L/min/sq-m (Normal 1.8-3.8 L/min/sq-m) Left atrium: Normal size Right atrium: Normal size Mitral valve: Trivial regurgitation Aortic valve: Trivial regurgitation Tricuspid valve: Trivial regurgitation Pulmonic valve: Trivial regurgitation Aorta: Normal proximal ascending aorta Pericardium: Normal IMPRESSION: 1. Meets criteria for acute myocarditis, with regional elevations in myocardial T1/T2 values, and LGE pattern of basal to mid septal and basal lateral midwall  LGE. 2. Normal LV size, mild hypertrophy, and mild systolic dysfunction (EF 45%) 3.  Normal RV size and systolic function (EF 53%) Electronically Signed   By: Carson Clara M.D.   On: 04/22/2024 17:09   MR CARDIAC VELOCITY FLOW MAP Result Date: 04/22/2024 CLINICAL DATA:  27F with HTN p/w MINOCA. Peak troponin 2000. LHC unremarkable. EXAM: CARDIAC MRI TECHNIQUE: The patient was scanned on a 1.5 Tesla Siemens magnet. A dedicated cardiac coil was used. Functional imaging was done using Fiesta sequences. 2,3, and 4 chamber views were done to assess for RWMA's. Modified Simpson's rule using a short axis stack was used to calculate an ejection fraction on a dedicated work [employer] officer, trade [employer]. The patient received 10 cc of Gadavist . After 10 minutes inversion recovery sequences were used to assess for infiltration and scar tissue. Phase contrast velocity mapping was performed above the aortic and pulmonic valves CONTRAST:  10 cc  of Gadavist  FINDINGS: Left ventricle: -Normal size -Mild hypertrophy -Mild systolic dysfunction -Elevated myocardial T1/T2 levels, suggesting myocardial edema. Unable to calculate ECV as post contrast T1 mapping was not done -Basal to mid septal midwall and basal lateral midwall LGE LV EF:  45% (Normal 52-79%) Absolute volumes: LV EDV: (Normal 78-167 mL) LV ESV: 63mL (Normal 21-64 mL) LV SV: 51mL (Normal 52-114 mL) CO: 4.3L/min (Normal 2.7-6.3 L/min) Indexed volumes: LV EDV: 41mL/sq-m (Normal 50-96 mL/sq-m) LV ESV: 72mL/sq-m (Normal 10-40 mL/sq-m) LV SV: 83mL/sq-m (Normal 33-64 mL/sq-m) CI: 2.2L/min/sq-m (Normal 1.9-3.9 L/min/sq-m) Right ventricle: Normal size and systolic function RV EF: 53% (Normal 52-80%) Absolute volumes: RV EDV: 96mL (Normal 79-175 mL) RV ESV: 45mL (Normal 13-75 mL) RV SV: 51mL (Normal 56-110 mL) CO: 4.3L/min (Normal 2.7-6 L/min) Indexed volumes: RV EDV: 36mL/sq-m (Normal 51-97 mL/sq-m) RV ESV: 10mL/sq-m (Normal 9-42 mL/sq-m) RV SV: 17mL/sq-m  (Normal 35-61 mL/sq-m) CI: 2.2L/min/sq-m (Normal 1.8-3.8 L/min/sq-m) Left atrium: Normal size Right atrium: Normal size Mitral valve: Trivial regurgitation Aortic valve: Trivial regurgitation Tricuspid valve: Trivial regurgitation Pulmonic valve: Trivial regurgitation Aorta: Normal proximal ascending aorta Pericardium: Normal IMPRESSION: 1. Meets criteria for acute myocarditis, with regional elevations in myocardial T1/T2 values, and LGE pattern of basal to mid septal and basal lateral midwall LGE. 2. Normal LV size, mild hypertrophy, and mild systolic dysfunction (EF 45%) 3.  Normal RV size and systolic function (EF 53%) Electronically Signed   By: Carson Clara M.D.   On: 04/22/2024 17:09   CARDIAC CATHETERIZATION Result Date: 04/21/2024 Coronary angiography 04/21/2024: LM: Normal LAD: Short vessel, does not reach apex-which is supplied by OMs and RPDA          No significant disease Lcx: No significant disease RCA: Dominant vessel, no significant disease LVEDP normal No significant coronary artery disease noted Consider demand ischemia or  myocarditis as the differential Cody Das, MD   ECHOCARDIOGRAM COMPLETE Result Date: 04/21/2024    ECHOCARDIOGRAM REPORT   Patient Name:   ADELIZ TONKINSON Carrillo Date of Exam: 04/21/2024 Medical Rec #:  [id number]        Height:       61.0 in Accession #:    [id number]       Weight:       200.0 lb Date of Birth:  09-28-1986         BSA:          1.889 m Patient Age:    38 years         BP:           125/97 mmHg Patient Gender: F                HR:           102 bpm. Exam Location:  Inpatient Procedure: 2D Echo, Cardiac Doppler and Color Doppler (Both Spectral and Color            Flow Doppler were utilized during procedure). Indications:    Elevated Troponin  History:        Patient has no prior history of Echocardiogram examinations.                 Risk Factors:Hypertension.  Sonographer:    Willey Harrier Referring Phys: [id number] SAGAR H JINWALA IMPRESSIONS  1.  Left ventricular ejection fraction, by estimation, is 50 to 55%. The left ventricle has low normal function. Ther is mild hypokinesis of the basal inferior/inferolateral/inferoseptal walls. The left ventricle demonstrates regional wall motion abnormalities (see scoring diagram/findings for description). Left ventricular diastolic parameters are consistent with Grade I diastolic dysfunction (impaired relaxation).  2. Right ventricular systolic function is normal. The right ventricular size is normal.  3. The mitral valve is normal in structure. Mild mitral valve regurgitation. No evidence of mitral stenosis.  4. The aortic valve is normal in structure. Aortic valve regurgitation is not visualized. No aortic stenosis is present.  5. The inferior vena cava is normal in size with greater than 50% respiratory variability, suggesting right atrial pressure of 3 mmHg. FINDINGS  Left Ventricle: Left ventricular ejection fraction, by estimation, is 50 to 55%. The left ventricle has low normal function. The left ventricle demonstrates regional wall motion abnormalities. The left ventricular internal cavity size was normal in size. There is no left ventricular hypertrophy. Left ventricular diastolic parameters are consistent with Grade I diastolic dysfunction (impaired relaxation).  LV Wall Scoring: The inferior wall, basal inferolateral segment, and basal inferoseptal segment are hypokinetic. Right Ventricle: The right ventricular size is normal. No increase in right ventricular wall thickness. Right ventricular systolic function is normal. Left Atrium: Left atrial size was normal in size. Right Atrium: Right atrial size was normal in size. Pericardium: There is no evidence of pericardial effusion. Mitral Valve: The mitral valve is normal in structure. Mild mitral valve regurgitation. No evidence of mitral valve stenosis. MV peak gradient, 5.1 mmHg. The mean mitral valve gradient is 2.0 mmHg. Tricuspid Valve: The tricuspid  valve is normal in structure. Tricuspid valve regurgitation is not demonstrated. No evidence of tricuspid stenosis. Aortic Valve: The aortic valve is normal in structure. Aortic valve regurgitation is not visualized. No aortic stenosis is present. Aortic valve peak gradient measures 9.5 mmHg. Pulmonic Valve: The pulmonic valve was normal in structure. Pulmonic valve regurgitation is not visualized. No evidence of pulmonic stenosis. Aorta: The  aortic root is normal in size and structure. Venous: The inferior vena cava is normal in size with greater than 50% respiratory variability, suggesting right atrial pressure of 3 mmHg. IAS/Shunts: No atrial level shunt detected by color flow Doppler.  LEFT VENTRICLE PLAX 2D LVIDd:         4.90 cm   Diastology LVIDs:         4.40 cm   LV e' medial:    6.20 cm/s LV PW:         0.70 cm   LV E/e' medial:  12.8 LV IVS:        0.80 cm   LV e' lateral:   6.42 cm/s LVOT diam:     1.90 cm   LV E/e' lateral: 12.4 LV SV:         56 LV SV Index:   30 LVOT Area:     2.84 cm  RIGHT VENTRICLE             IVC RV Basal diam:  2.50 cm     IVC diam: 1.10 cm RV S prime:     15.60 cm/s TAPSE (M-mode): 2.0 cm LEFT ATRIUM             Index        RIGHT ATRIUM          Index LA Vol (A2C):   43.6 ml 23.08 ml/m  RA Area:     8.08 cm LA Vol (A4C):   36.1 ml 19.11 ml/m  RA Volume:   15.30 ml 8.10 ml/m LA Biplane Vol: 39.8 ml 21.07 ml/m  AORTIC VALVE AV Area (Vmax): 2.06 cm AV Vmax:        154.00 cm/s AV Peak Grad:   9.5 mmHg LVOT Vmax:      112.00 cm/s LVOT Vmean:     78.500 cm/s LVOT VTI:       0.199 m  AORTA Ao Root diam: 2.60 cm Ao Asc diam:  3.50 cm MITRAL VALVE MV Area (PHT): 5.50 cm    SHUNTS MV Area VTI:   3.90 cm    Systemic VTI:  0.20 m MV Peak grad:  5.1 mmHg    Systemic Diam: 1.90 cm MV Mean grad:  2.0 mmHg MV Vmax:       1.13 m/s MV Vmean:      71.4 cm/s MV Decel Time: 138 msec MR Peak grad: 147.9 mmHg MR Vmax:      608.00 cm/s MV E velocity: 79.30 cm/s MV A velocity: 91.70 cm/s MV  E/A ratio:  0.86 Aditya Sabharwal Electronically signed by Alwin Baars Signature Date/Time: 04/21/2024/11:09:14 AM    Final    DG Chest 2 View Result Date: 04/20/2024 CLINICAL DATA:  Chest pain EXAM: CHEST - 2 VIEW COMPARISON:  03/04/2022. FINDINGS: Bilateral lung fields are clear. Bilateral costophrenic angles are clear. Normal cardio-mediastinal silhouette. No acute osseous abnormalities. The soft tissues are within normal limits. IMPRESSION: No active cardiopulmonary disease. Electronically Signed   By: Beula Brunswick M.D.   On: 04/20/2024 13:20     Subjective: Patient seen and examined at bedside, lying in bed.  No complaints this morning.  Tolerating diet with no further nausea or vomiting.  Denies chest pain.  Seen by cardiology, MRI cardiac yesterday with findings consistent with acute myocarditis.  Cardiology recommends initiation of beta-blocker with metoprolol  succinate; and okay to discharge with outpatient follow-up.  Discontinued Zoloft  due to QTc prolongation.  Patient with no other specific complaints,  questions, concerns at this time.  Denies headache, no dizziness, no chest pain, palpitations, no shortness of breath, no abdominal pain, no fever/chills/night sweats, no nausea/vomiting/diarrhea, no focal weakness, no fatigue, no paresthesia.  No acute events overnight per nursing staff.  Discharge Exam: Vitals:   04/23/24 0830 04/23/24 1131  BP:    Pulse: 74 98  Resp: (!) 24   Temp:    SpO2: 97%    Vitals:   04/23/24 0828 04/23/24 0829 04/23/24 0830 04/23/24 1131  BP: 112/68     Pulse: 73 76 74 98  Resp: 13 11 (!) 24   Temp:      TempSrc: Oral     SpO2: 99% 100% 97%   Weight:      Height:        Physical Exam: GEN: NAD, alert and oriented x 3, obese HEENT: NCAT, PERRL, EOMI, sclera clear, MMM PULM: CTAB w/o wheezes/crackles, normal respiratory effort, room air CV: RRR w/o M/G/R GI: abd soft, NTND, NABS, no R/G/M MSK: no peripheral edema, muscle strength  globally intact 5/5 bilateral upper/lower extremities NEURO: CN II-XII intact, no focal deficits, sensation to light touch intact PSYCH: normal mood/affect Integumentary: dry/intact, no rashes or wounds    The results of significant diagnostics from this hospitalization (including imaging, microbiology, ancillary and laboratory) are listed below for reference.     Microbiology: Recent Results (from the past 240 hours)  Resp panel by RT-PCR (RSV, Flu A&B, Covid) Urine, Clean Catch     Status: None   Collection Time: 04/20/24 11:23 AM   Specimen: Urine, Clean Catch; Nasal Swab  Result Value Ref Range Status   SARS Coronavirus 2 by RT PCR NEGATIVE NEGATIVE Final    Comment: (NOTE) SARS-CoV-2 target nucleic acids are NOT DETECTED.  The SARS-CoV-2 RNA is generally detectable in upper respiratory specimens during the acute phase of infection. The lowest concentration of SARS-CoV-2 viral copies this assay can detect is 138 copies/mL. A negative result does not preclude SARS-Cov-2 infection and should not be used as the sole basis for treatment or other patient management decisions. A negative result may occur with  improper specimen collection/handling, submission of specimen other than nasopharyngeal swab, presence of viral mutation(s) within the areas targeted by this assay, and inadequate number of viral copies(<138 copies/mL). A negative result must be combined with clinical observations, patient history, and epidemiological information. The expected result is Negative.  Fact Sheet for Patients:  BloggerCourse.com  Fact Sheet for Healthcare Providers:  SeriousBroker.it  This test is no t yet approved or cleared by the [place]  FDA and  has been authorized for detection and/or diagnosis of SARS-CoV-2 by FDA under an Emergency Use Authorization (EUA). This EUA will remain  in effect (meaning this test can be used) for the  duration of the COVID-19 declaration under Section 564(b)(1) of the Act, 21 U.S.C.section 360bbb-3(b)(1), unless the authorization is terminated  or revoked sooner.       Influenza A by PCR NEGATIVE NEGATIVE Final   Influenza B by PCR NEGATIVE NEGATIVE Final    Comment: (NOTE) The Xpert Xpress SARS-CoV-2/FLU/RSV plus assay is intended as an aid in the diagnosis of influenza from Nasopharyngeal swab specimens and should not be used as a sole basis for treatment. Nasal washings and aspirates are unacceptable for Xpert Xpress SARS-CoV-2/FLU/RSV testing.  Fact Sheet for Patients: BloggerCourse.com  Fact Sheet for Healthcare Providers: SeriousBroker.it  This test is not yet approved or cleared by the [place]  FDA and has been  authorized for detection and/or diagnosis of SARS-CoV-2 by FDA under an Emergency Use Authorization (EUA). This EUA will remain in effect (meaning this test can be used) for the duration of the COVID-19 declaration under Section 564(b)(1) of the Act, 21 U.S.C. section 360bbb-3(b)(1), unless the authorization is terminated or revoked.     Resp Syncytial Virus by PCR NEGATIVE NEGATIVE Final    Comment: (NOTE) Fact Sheet for Patients: BloggerCourse.com  Fact Sheet for Healthcare Providers: SeriousBroker.it  This test is not yet approved or cleared by the [place]  FDA and has been authorized for detection and/or diagnosis of SARS-CoV-2 by FDA under an Emergency Use Authorization (EUA). This EUA will remain in effect (meaning this test can be used) for the duration of the COVID-19 declaration under Section 564(b)(1) of the Act, 21 U.S.C. section 360bbb-3(b)(1), unless the authorization is terminated or revoked.  Performed at [facility], [address], [place], [place] [address]   Respiratory (~20 pathogens) panel by PCR      Status: None   Collection Time: 04/20/24 11:23 AM   Specimen: Nasopharyngeal Swab; Respiratory  Result Value Ref Range Status   Adenovirus NOT DETECTED NOT DETECTED Final   Coronavirus 229E NOT DETECTED NOT DETECTED Final    Comment: (NOTE) The Coronavirus on the Respiratory Panel, DOES NOT test for the novel  Coronavirus (2019 nCoV)    Coronavirus HKU1 NOT DETECTED NOT DETECTED Final   Coronavirus NL63 NOT DETECTED NOT DETECTED Final   Coronavirus OC43 NOT DETECTED NOT DETECTED Final   Metapneumovirus NOT DETECTED NOT DETECTED Final   Rhinovirus / Enterovirus NOT DETECTED NOT DETECTED Final   Influenza A NOT DETECTED NOT DETECTED Final   Influenza B NOT DETECTED NOT DETECTED Final   Parainfluenza Virus 1 NOT DETECTED NOT DETECTED Final   Parainfluenza Virus 2 NOT DETECTED NOT DETECTED Final   Parainfluenza Virus 3 NOT DETECTED NOT DETECTED Final   Parainfluenza Virus 4 NOT DETECTED NOT DETECTED Final   Respiratory Syncytial Virus NOT DETECTED NOT DETECTED Final   Bordetella pertussis NOT DETECTED NOT DETECTED Final   Bordetella Parapertussis NOT DETECTED NOT DETECTED Final   Chlamydophila pneumoniae NOT DETECTED NOT DETECTED Final   Mycoplasma pneumoniae NOT DETECTED NOT DETECTED Final    Comment: Performed at [facility], [address], [place], [place] [address]     Labs: BNP (last 3 results) No results for input(s): "BNP" in the last 8760 hours. Basic Metabolic Panel: Recent Labs  Lab 04/20/24 1123 04/21/24 0036 04/21/24 1605 04/22/24 0621 04/23/24 0450  NA 138 135  --  134* 133*  K 3.6 3.2*  --  3.4* 3.7  CL 101 101  --  100 104  CO2 21* 19*  --  22 19*  GLUCOSE 139* 152*  --  108* 90  BUN 8 6  --  8 19  CREATININE 0.62 0.56 0.78 0.88 1.01*  CALCIUM  9.6 9.0  --  8.8* 8.4*  MG  --   --   --   --  2.3   Liver Function Tests: Recent Labs  Lab 04/20/24 1123  AST 36  ALT 17  ALKPHOS 89  BILITOT 0.9  PROT 10.0*  ALBUMIN 5.0   Recent Labs  Lab  04/20/24 1123  LIPASE 28   No results for input(s): "AMMONIA" in the last 168 hours. CBC: Recent Labs  Lab 04/20/24 1123 04/21/24 0036 04/21/24 1605 04/22/24 0621 04/23/24 0450  WBC 20.3* 21.4* 23.6* 20.9* 13.9*  NEUTROABS 18.4*  --   --   --   --  HGB 15.9* 16.5* 16.5* 16.9* 15.6*  HCT 49.0* 51.3* 49.4* 49.9* 46.2*  MCV 80.6 81.3 76.9* 77.4* 77.8*  PLT 388 369 425* 418* 366   Cardiac Enzymes: No results for input(s): "CKTOTAL", "CKMB", "CKMBINDEX", "TROPONINI" in the last 168 hours. BNP: Invalid input(s): "POCBNP" CBG: No results for input(s): "GLUCAP" in the last 168 hours. D-Dimer No results for input(s): "DDIMER" in the last 72 hours. Hgb A1c No results for input(s): "HGBA1C" in the last 72 hours. Lipid Profile No results for input(s): "CHOL", "HDL", "LDLCALC", "TRIG", "CHOLHDL", "LDLDIRECT" in the last 72 hours. Thyroid  function studies No results for input(s): "TSH", "T4TOTAL", "T3FREE", "THYROIDAB" in the last 72 hours.  Invalid input(s): "FREET3" Anemia work up No results for input(s): "VITAMINB12", "FOLATE", "FERRITIN", "TIBC", "IRON ", "RETICCTPCT" in the last 72 hours. Urinalysis    Component Value Date/Time   COLORURINE STRAW (A) 04/20/2024 1123   APPEARANCEUR CLEAR 04/20/2024 1123   LABSPEC 1.009 04/20/2024 1123   PHURINE 8.0 04/20/2024 1123   GLUCOSEU 150 (A) 04/20/2024 1123   HGBUR MODERATE (A) 04/20/2024 1123   BILIRUBINUR NEGATIVE 04/20/2024 1123   KETONESUR 5 (A) 04/20/2024 1123   PROTEINUR 30 (A) 04/20/2024 1123   UROBILINOGEN 0.2 06/08/2015 0736   NITRITE NEGATIVE 04/20/2024 1123   LEUKOCYTESUR NEGATIVE 04/20/2024 1123   Sepsis Labs Recent Labs  Lab 04/21/24 0036 04/21/24 1605 04/22/24 0621 04/23/24 0450  WBC 21.4* 23.6* 20.9* 13.9*   Microbiology Recent Results (from the past 240 hours)  Resp panel by RT-PCR (RSV, Flu A&B, Covid) Urine, Clean Catch     Status: None   Collection Time: 04/20/24 11:23 AM   Specimen: Urine, Clean  Catch; Nasal Swab  Result Value Ref Range Status   SARS Coronavirus 2 by RT PCR NEGATIVE NEGATIVE Final    Comment: (NOTE) SARS-CoV-2 target nucleic acids are NOT DETECTED.  The SARS-CoV-2 RNA is generally detectable in upper respiratory specimens during the acute phase of infection. The lowest concentration of SARS-CoV-2 viral copies this assay can detect is 138 copies/mL. A negative result does not preclude SARS-Cov-2 infection and should not be used as the sole basis for treatment or other patient management decisions. A negative result may occur with  improper specimen collection/handling, submission of specimen other than nasopharyngeal swab, presence of viral mutation(s) within the areas targeted by this assay, and inadequate number of viral copies(<138 copies/mL). A negative result must be combined with clinical observations, patient history, and epidemiological information. The expected result is Negative.  Fact Sheet for Patients:  BloggerCourse.com  Fact Sheet for Healthcare Providers:  SeriousBroker.it  This test is no t yet approved or cleared by the [place]  FDA and  has been authorized for detection and/or diagnosis of SARS-CoV-2 by FDA under an Emergency Use Authorization (EUA). This EUA will remain  in effect (meaning this test can be used) for the duration of the COVID-19 declaration under Section 564(b)(1) of the Act, 21 U.S.C.section 360bbb-3(b)(1), unless the authorization is terminated  or revoked sooner.       Influenza A by PCR NEGATIVE NEGATIVE Final   Influenza B by PCR NEGATIVE NEGATIVE Final    Comment: (NOTE) The Xpert Xpress SARS-CoV-2/FLU/RSV plus assay is intended as an aid in the diagnosis of influenza from Nasopharyngeal swab specimens and should not be used as a sole basis for treatment. Nasal washings and aspirates are unacceptable for Xpert Xpress SARS-CoV-2/FLU/RSV testing.  Fact  Sheet for Patients: BloggerCourse.com  Fact Sheet for Healthcare Providers: SeriousBroker.it  This test is not  yet approved or cleared by the [place]  FDA and has been authorized for detection and/or diagnosis of SARS-CoV-2 by FDA under an Emergency Use Authorization (EUA). This EUA will remain in effect (meaning this test can be used) for the duration of the COVID-19 declaration under Section 564(b)(1) of the Act, 21 U.S.C. section 360bbb-3(b)(1), unless the authorization is terminated or revoked.     Resp Syncytial Virus by PCR NEGATIVE NEGATIVE Final    Comment: (NOTE) Fact Sheet for Patients: BloggerCourse.com  Fact Sheet for Healthcare Providers: SeriousBroker.it  This test is not yet approved or cleared by the [place]  FDA and has been authorized for detection and/or diagnosis of SARS-CoV-2 by FDA under an Emergency Use Authorization (EUA). This EUA will remain in effect (meaning this test can be used) for the duration of the COVID-19 declaration under Section 564(b)(1) of the Act, 21 U.S.C. section 360bbb-3(b)(1), unless the authorization is terminated or revoked.  Performed at [facility], [address] [place], [place] [address]   Respiratory (~20 pathogens) panel by PCR     Status: None   Collection Time: 04/20/24 11:23 AM   Specimen: Nasopharyngeal Swab; Respiratory  Result Value Ref Range Status   Adenovirus NOT DETECTED NOT DETECTED Final   Coronavirus 229E NOT DETECTED NOT DETECTED Final    Comment: (NOTE) The Coronavirus on the Respiratory Panel, DOES NOT test for the novel  Coronavirus (2019 nCoV)    Coronavirus HKU1 NOT DETECTED NOT DETECTED Final   Coronavirus NL63 NOT DETECTED NOT DETECTED Final   Coronavirus OC43 NOT DETECTED NOT DETECTED Final   Metapneumovirus NOT DETECTED NOT DETECTED Final   Rhinovirus / Enterovirus  NOT DETECTED NOT DETECTED Final   Influenza A NOT DETECTED NOT DETECTED Final   Influenza B NOT DETECTED NOT DETECTED Final   Parainfluenza Virus 1 NOT DETECTED NOT DETECTED Final   Parainfluenza Virus 2 NOT DETECTED NOT DETECTED Final   Parainfluenza Virus 3 NOT DETECTED NOT DETECTED Final   Parainfluenza Virus 4 NOT DETECTED NOT DETECTED Final   Respiratory Syncytial Virus NOT DETECTED NOT DETECTED Final   Bordetella pertussis NOT DETECTED NOT DETECTED Final   Bordetella Parapertussis NOT DETECTED NOT DETECTED Final   Chlamydophila pneumoniae NOT DETECTED NOT DETECTED Final   Mycoplasma pneumoniae NOT DETECTED NOT DETECTED Final    Comment: Performed at [facility], [address], [place], [place] [address]     Time coordinating discharge: Over 30 minutes  SIGNED:   Rema Care [place], DO  [facility] 04/23/2024, 12:13 PM

## 2024-04-23 NOTE — Progress Notes (Addendum)
 Rounding Note    Patient Name: Frances Mccormick Date of Encounter: 04/23/2024  [facility] Cardiologist: Wendie Hamburg, MD   Subjective   BP 112/68.  Cr 1.0.  Denies any chest pain or dyspnea  Inpatient Medications    Scheduled Meds:  aspirin  324 mg Oral Once   atorvastatin  80 mg Oral Daily   heparin   5,000 Units Subcutaneous Q8H   metoprolol  tartrate  25 mg Oral BID   sertraline   50 mg Oral QHS   sodium chloride  flush  3 mL Intravenous Q12H   Continuous Infusions:   PRN Meds: acetaminophen , dicyclomine , gadobutrol, guaiFENesin-dextromethorphan, HYDROmorphone  (DILAUDID ) injection, ipratropium-albuterol, LORazepam , sodium chloride  flush, trimethobenzamide   Vital Signs    Vitals:   04/23/24 0827 04/23/24 0828 04/23/24 0829 04/23/24 0830  BP:  112/68    Pulse: 81 73 76 74  Resp: 13 13 11  (!) 24  Temp:      TempSrc:  Oral    SpO2: 100% 99% 100% 97%  Weight:      Height:        Intake/Output Summary (Last 24 hours) at 04/23/2024 1054 Last data filed at 04/23/2024 0424 Gross per 24 hour  Intake 360 ml  Output --  Net 360 ml      04/20/2024    9:48 AM 03/31/2024    2:05 PM 03/03/2024    1:30 PM  Last 3 Weights  Weight (lbs) 200 lb 199 lb 193 lb  Weight (kg) 90.719 kg 90.266 kg 87.544 kg      Telemetry    NSR - Personally Reviewed  ECG    No new ECG - Personally Reviewed  Physical Exam   GEN: No acute distress.   Neck: No JVD Cardiac: RRR, no murmurs, rubs, or gallops.  Respiratory: Clear to auscultation bilaterally. GI: Soft, nontender, non-distended  MS: No edema; No deformity. Neuro:  Nonfocal  Psych: Normal affect   Labs    High Sensitivity Troponin:   Recent Labs  Lab 04/20/24 2057 04/20/24 2249 04/21/24 0036 04/21/24 0426 04/21/24 0714  TROPONINIHS 864* 1,492* 1,746* 1,961* 1,963*     Chemistry Recent Labs  Lab 04/20/24 1123 04/21/24 0036 04/21/24 1605 04/22/24 0621 04/23/24 0450  NA 138 135  --  134*  133*  K 3.6 3.2*  --  3.4* 3.7  CL 101 101  --  100 104  CO2 21* 19*  --  22 19*  GLUCOSE 139* 152*  --  108* 90  BUN 8 6  --  8 19  CREATININE 0.62 0.56 0.78 0.88 1.01*  CALCIUM  9.6 9.0  --  8.8* 8.4*  MG  --   --   --   --  2.3  PROT 10.0*  --   --   --   --   ALBUMIN 5.0  --   --   --   --   AST 36  --   --   --   --   ALT 17  --   --   --   --   ALKPHOS 89  --   --   --   --   BILITOT 0.9  --   --   --   --   GFRNONAA >60 >60 >60 >60 >60  ANIONGAP 16* 15  --  12 10    Lipids No results for input(s): "CHOL", "TRIG", "HDL", "LABVLDL", "LDLCALC", "CHOLHDL" in the last 168 hours.  Hematology Recent Labs  Lab  04/21/24 1605 04/22/24 0621 04/23/24 0450  WBC 23.6* 20.9* 13.9*  RBC 6.42* 6.45* 5.94*  HGB 16.5* 16.9* 15.6*  HCT 49.4* 49.9* 46.2*  MCV 76.9* 77.4* 77.8*  MCH 25.7* 26.2 26.3  MCHC 33.4 33.9 33.8  RDW 17.2* 17.5* 16.4*  PLT 425* 418* 366   Thyroid  No results for input(s): "TSH", "FREET4" in the last 168 hours.  BNPNo results for input(s): "BNP", "PROBNP" in the last 168 hours.  DDimer No results for input(s): "DDIMER" in the last 168 hours.   Radiology    MR CARDIAC MORPHOLOGY W WO CONTRAST Result Date: 04/22/2024 CLINICAL DATA:  39F with HTN p/w MINOCA. Peak troponin 2000. LHC unremarkable. EXAM: CARDIAC MRI TECHNIQUE: The patient was scanned on a 1.5 Tesla Siemens magnet. A dedicated cardiac coil was used. Functional imaging was done using Fiesta sequences. 2,3, and 4 chamber views were done to assess for RWMA's. Modified Simpson's rule using a short axis stack was used to calculate an ejection fraction on a dedicated work [employer], [employer]. The patient received 10 cc of Gadavist. After 10 minutes inversion recovery sequences were used to assess for infiltration and scar tissue. Phase contrast velocity mapping was performed above the aortic and pulmonic valves CONTRAST:  10 cc  of Gadavist FINDINGS: Left ventricle: -Normal size -Mild hypertrophy -Mild  systolic dysfunction -Elevated myocardial T1/T2 levels, suggesting myocardial edema. Unable to calculate ECV as post contrast T1 mapping was not done -Basal to mid septal midwall and basal lateral midwall LGE LV EF:  45% (Normal 52-79%) Absolute volumes: LV EDV: (Normal 78-167 mL) LV ESV: 63mL (Normal 21-64 mL) LV SV: 51mL (Normal 52-114 mL) CO: 4.3L/min (Normal 2.7-6.3 L/min) Indexed volumes: LV EDV: 66mL/sq-m (Normal 50-96 mL/sq-m) LV ESV: 25mL/sq-m (Normal 10-40 mL/sq-m) LV SV: 35mL/sq-m (Normal 33-64 mL/sq-m) CI: 2.2L/min/sq-m (Normal 1.9-3.9 L/min/sq-m) Right ventricle: Normal size and systolic function RV EF: 53% (Normal 52-80%) Absolute volumes: RV EDV: 96mL (Normal 79-175 mL) RV ESV: 45mL (Normal 13-75 mL) RV SV: 51mL (Normal 56-110 mL) CO: 4.3L/min (Normal 2.7-6 L/min) Indexed volumes: RV EDV: 6mL/sq-m (Normal 51-97 mL/sq-m) RV ESV: 62mL/sq-m (Normal 9-42 mL/sq-m) RV SV: 75mL/sq-m (Normal 35-61 mL/sq-m) CI: 2.2L/min/sq-m (Normal 1.8-3.8 L/min/sq-m) Left atrium: Normal size Right atrium: Normal size Mitral valve: Trivial regurgitation Aortic valve: Trivial regurgitation Tricuspid valve: Trivial regurgitation Pulmonic valve: Trivial regurgitation Aorta: Normal proximal ascending aorta Pericardium: Normal IMPRESSION: 1. Meets criteria for acute myocarditis, with regional elevations in myocardial T1/T2 values, and LGE pattern of basal to mid septal and basal lateral midwall LGE. 2. Normal LV size, mild hypertrophy, and mild systolic dysfunction (EF 45%) 3.  Normal RV size and systolic function (EF 53%) Electronically Signed   By: Carson Clara M.D.   On: 04/22/2024 17:09   MR CARDIAC VELOCITY FLOW MAP Result Date: 04/22/2024 CLINICAL DATA:  39F with HTN p/w MINOCA. Peak troponin 2000. LHC unremarkable. EXAM: CARDIAC MRI TECHNIQUE: The patient was scanned on a 1.5 Tesla Siemens magnet. A dedicated cardiac coil was used. Functional imaging was done using Fiesta sequences. 2,3, and 4 chamber  views were done to assess for RWMA's. Modified Simpson's rule using a short axis stack was used to calculate an ejection fraction on a dedicated work [employer] officer, trade [employer]. The patient received 10 cc of Gadavist. After 10 minutes inversion recovery sequences were used to assess for infiltration and scar tissue. Phase contrast velocity mapping was performed above the aortic and pulmonic valves CONTRAST:  10 cc  of Gadavist FINDINGS: Left ventricle: -  Normal size -Mild hypertrophy -Mild systolic dysfunction -Elevated myocardial T1/T2 levels, suggesting myocardial edema. Unable to calculate ECV as post contrast T1 mapping was not done -Basal to mid septal midwall and basal lateral midwall LGE LV EF:  45% (Normal 52-79%) Absolute volumes: LV EDV: (Normal 78-167 mL) LV ESV: 63mL (Normal 21-64 mL) LV SV: 51mL (Normal 52-114 mL) CO: 4.3L/min (Normal 2.7-6.3 L/min) Indexed volumes: LV EDV: 18mL/sq-m (Normal 50-96 mL/sq-m) LV ESV: 70mL/sq-m (Normal 10-40 mL/sq-m) LV SV: 87mL/sq-m (Normal 33-64 mL/sq-m) CI: 2.2L/min/sq-m (Normal 1.9-3.9 L/min/sq-m) Right ventricle: Normal size and systolic function RV EF: 53% (Normal 52-80%) Absolute volumes: RV EDV: 96mL (Normal 79-175 mL) RV ESV: 45mL (Normal 13-75 mL) RV SV: 51mL (Normal 56-110 mL) CO: 4.3L/min (Normal 2.7-6 L/min) Indexed volumes: RV EDV: 36mL/sq-m (Normal 51-97 mL/sq-m) RV ESV: 48mL/sq-m (Normal 9-42 mL/sq-m) RV SV: 42mL/sq-m (Normal 35-61 mL/sq-m) CI: 2.2L/min/sq-m (Normal 1.8-3.8 L/min/sq-m) Left atrium: Normal size Right atrium: Normal size Mitral valve: Trivial regurgitation Aortic valve: Trivial regurgitation Tricuspid valve: Trivial regurgitation Pulmonic valve: Trivial regurgitation Aorta: Normal proximal ascending aorta Pericardium: Normal IMPRESSION: 1. Meets criteria for acute myocarditis, with regional elevations in myocardial T1/T2 values, and LGE pattern of basal to mid septal and basal lateral midwall LGE. 2. Normal LV size, mild hypertrophy,  and mild systolic dysfunction (EF 45%) 3.  Normal RV size and systolic function (EF 53%) Electronically Signed   By: Carson Clara M.D.   On: 04/22/2024 17:09   MR CARDIAC VELOCITY FLOW MAP Result Date: 04/22/2024 CLINICAL DATA:  34F with HTN p/w MINOCA. Peak troponin 2000. LHC unremarkable. EXAM: CARDIAC MRI TECHNIQUE: The patient was scanned on a 1.5 Tesla Siemens magnet. A dedicated cardiac coil was used. Functional imaging was done using Fiesta sequences. 2,3, and 4 chamber views were done to assess for RWMA's. Modified Simpson's rule using a short axis stack was used to calculate an ejection fraction on a dedicated work [employer] officer, trade [employer]. The patient received 10 cc of Gadavist. After 10 minutes inversion recovery sequences were used to assess for infiltration and scar tissue. Phase contrast velocity mapping was performed above the aortic and pulmonic valves CONTRAST:  10 cc  of Gadavist FINDINGS: Left ventricle: -Normal size -Mild hypertrophy -Mild systolic dysfunction -Elevated myocardial T1/T2 levels, suggesting myocardial edema. Unable to calculate ECV as post contrast T1 mapping was not done -Basal to mid septal midwall and basal lateral midwall LGE LV EF:  45% (Normal 52-79%) Absolute volumes: LV EDV: (Normal 78-167 mL) LV ESV: 63mL (Normal 21-64 mL) LV SV: 51mL (Normal 52-114 mL) CO: 4.3L/min (Normal 2.7-6.3 L/min) Indexed volumes: LV EDV: 54mL/sq-m (Normal 50-96 mL/sq-m) LV ESV: 72mL/sq-m (Normal 10-40 mL/sq-m) LV SV: 20mL/sq-m (Normal 33-64 mL/sq-m) CI: 2.2L/min/sq-m (Normal 1.9-3.9 L/min/sq-m) Right ventricle: Normal size and systolic function RV EF: 53% (Normal 52-80%) Absolute volumes: RV EDV: 96mL (Normal 79-175 mL) RV ESV: 45mL (Normal 13-75 mL) RV SV: 51mL (Normal 56-110 mL) CO: 4.3L/min (Normal 2.7-6 L/min) Indexed volumes: RV EDV: 2mL/sq-m (Normal 51-97 mL/sq-m) RV ESV: 6mL/sq-m (Normal 9-42 mL/sq-m) RV SV: 22mL/sq-m (Normal 35-61 mL/sq-m) CI: 2.2L/min/sq-m (Normal  1.8-3.8 L/min/sq-m) Left atrium: Normal size Right atrium: Normal size Mitral valve: Trivial regurgitation Aortic valve: Trivial regurgitation Tricuspid valve: Trivial regurgitation Pulmonic valve: Trivial regurgitation Aorta: Normal proximal ascending aorta Pericardium: Normal IMPRESSION: 1. Meets criteria for acute myocarditis, with regional elevations in myocardial T1/T2 values, and LGE pattern of basal to mid septal and basal lateral midwall LGE. 2. Normal LV size, mild hypertrophy, and mild systolic dysfunction (  EF 45%) 3.  Normal RV size and systolic function (EF 53%) Electronically Signed   By: Carson Clara M.D.   On: 04/22/2024 17:09   CARDIAC CATHETERIZATION Result Date: 04/21/2024 Coronary angiography 04/21/2024: LM: Normal LAD: Short vessel, does not reach apex-which is supplied by OMs and RPDA          No significant disease Lcx: No significant disease RCA: Dominant vessel, no significant disease LVEDP normal No significant coronary artery disease noted Consider demand ischemia or myocarditis as the differential Cody Das, MD    Cardiac Studies     Patient Profile     38 y.o. female  with a hx of Crohn's disease, hypertension, who is being seen 04/21/2024 for the evaluation of elevated troponins   Assessment & Plan    Acute myocarditis P/w N/V, denied any chest pain.  Initial troponin 30, peaked at 2000.  Echocardiogram showed EF 50 to 55% but with hypokinesis in inferior/inferolateral/inferoseptal walls.  LHC showed normal coronary arteries.  Cardiac MRI shows acute myocarditis with regional elevations in myocardial T1/T2 as well as nonischemic LGE pattern, LVEF 45%, RVEF 53%.  Normal ESR, mildly elevated CRP Given systolic dysfunction, will add Toprol -XL 25 mg daily.  No room on BP to add further GDMT Discussed avoiding strenuous activity for next 3 months   Hypertensive urgency BP significantly elevated on admission, likely due to nausea/vomiting.  Has improved  with resolution of nausea, was having soft BP yesterday.  Currently normotensive, plan to add Toprol -XL as above   Prolonged QTc 530 on yesterday's EKG. replete potassium greater than 4 and magnesium  greater than 2.  Careful with antiemetics and avoid QT prolonging medications. Repeat EKG today to monitor shows Qtc 508, would hold zoloft  for now  [facility] will sign off.   Medication Recommendations:  Toprol  XL 25 mg daily Other recommendations (labs, testing, etc): None Follow up as an outpatient: We will schedule   For questions or updates, please contact [facility] Please consult [email or web address] for contact info under        Signed, Wendie Hamburg, MD  04/23/2024, 10:54 AM

## 2024-04-23 NOTE — Progress Notes (Signed)
 DISCHARGE NOTE [facility] to be discharged Home per MD order. Discussed prescriptions and follow up appointments with the patient. Prescriptions given to patient; medication list explained in detail. Patient verbalized understanding.  Skin clean, dry and intact without evidence of skin break down, no evidence of skin tears noted. IV catheter discontinued intact. Site without signs and symptoms of complications. Dressing and pressure applied. Pt denies pain at the site currently. No complaints noted.  Patient free of lines, drains, and wounds.   An After Visit Summary (AVS) was printed and given to the patient. Patient escorted via wheelchair, and discharged home via private auto.  Tonda Francisco, RN

## 2024-04-26 ENCOUNTER — Telehealth: Payer: Self-pay

## 2024-04-26 LAB — LIPOPROTEIN A (LPA): Lipoprotein (a): 67.8 nmol/L — ABNORMAL HIGH (ref <75.0)

## 2024-04-26 NOTE — Transitions of Care (Post Inpatient/ED Visit) (Signed)
   04/26/2024  Name: Frances Mccormick MRN: [id number] DOB: December 20, 1985  Today's TOC FU Call Status: Today's TOC FU Call Status:: Successful TOC FU Call Completed TOC FU Call Complete Date: 04/26/24 Patient's Name and Date of Birth confirmed.  Transition Care Management Follow-up Telephone Call Date of Discharge: 04/23/24 Discharge Facility: [facility]) Type of Discharge: Inpatient Admission Primary Inpatient Discharge Diagnosis:: nausea vomiting How have you been since you were released from the hospital?: Better Any questions or concerns?: No  Items Reviewed: Did you receive and understand the discharge instructions provided?: Yes Medications obtained,verified, and reconciled?: Yes (Medications Reviewed) Any new allergies since your discharge?: No Dietary orders reviewed?: Yes Do you have support at home?: Yes People in Home [RPT]: friend(s)  Medications Reviewed Today: Medications Reviewed Today     Reviewed by Darrall Ellison, LPN (Licensed Practical Nurse) on 04/26/24 at 1230  Med List Status: <None>   Medication Order Taking? Sig Documenting Provider Last Dose Status Informant  LORazepam  (ATIVAN ) 1 MG tablet [id number]  Take 1 tablet (1 mg total) by mouth every 8 (eight) hours as needed for anxiety (Nausea). [place], Rema Care, DO  Active   metoCLOPramide  (REGLAN ) 10 MG tablet [id number] No Take 1 tablet (10 mg total) by mouth every 8 (eight) hours as needed for nausea or vomiting. Lyna Sandhoff, PA-C Unknown Active Self, Pharmacy Records  metoprolol  succinate (TOPROL -XL) 25 MG 24 hr tablet [id number]  Take 1 tablet (25 mg total) by mouth daily. [place], Rema Care, DO  Active   Norethindrone  Acetate-Ethinyl Estrad-FE (LOESTRIN 24 FE) 1-20 MG-MCG(24) tablet [id number] No Take 1 tablet by mouth daily. Ether Hercules, MD Past Week Active Self, Pharmacy Records  Pseudoephedrine-APAP-DM (TYLENOL  COLD/FLU DAY PO) [id number] No Take 1 Dose by mouth every 6 (six) hours as needed  (Cough/cold). [provider] Past Week Active Self, Pharmacy Records  sertraline  (ZOLOFT ) 50 MG tablet [id number] No Take 1 tablet (50 mg total) by mouth daily. Ether Hercules, MD Past Week Active Self, Pharmacy Records            Home Care and Equipment/Supplies: Were Home Health Services Ordered?: NA Any new equipment or medical supplies ordered?: NA  Functional Questionnaire: Do you need assistance with bathing/showering or dressing?: No Do you need assistance with meal preparation?: No Do you need assistance with eating?: No Do you have difficulty maintaining continence: No Do you need assistance with getting out of bed/getting out of a chair/moving?: No Do you have difficulty managing or taking your medications?: No  Follow up appointments reviewed: PCP Follow-up appointment confirmed?: Yes Date of PCP follow-up appointment?: 04/28/24 Follow-up Provider: [facility] Follow-up appointment confirmed?: Yes Date of Specialist follow-up appointment?: 04/29/24 Follow-Up Specialty Provider:: cardio Do you need transportation to your follow-up appointment?: No Do you understand care options if your condition(s) worsen?: Yes-patient verbalized understanding    SIGNATURE Darrall Ellison, LPN [facility] Nurse Health Advisor Direct Dial [phone]

## 2024-04-28 ENCOUNTER — Ambulatory Visit (INDEPENDENT_AMBULATORY_CARE_PROVIDER_SITE_OTHER): Admitting: Student in an Organized Health Care Education/Training Program

## 2024-04-28 ENCOUNTER — Encounter: Payer: Self-pay | Admitting: Student in an Organized Health Care Education/Training Program

## 2024-04-28 VITALS — BP 111/54 | HR 67 | Temp 97.9°F | Wt 198.0 lb

## 2024-04-28 DIAGNOSIS — F321 Major depressive disorder, single episode, moderate: Secondary | ICD-10-CM | POA: Diagnosis not present

## 2024-04-28 DIAGNOSIS — R9431 Abnormal electrocardiogram [ECG] [EKG]: Secondary | ICD-10-CM | POA: Diagnosis not present

## 2024-04-28 DIAGNOSIS — I400 Infective myocarditis: Secondary | ICD-10-CM | POA: Diagnosis not present

## 2024-04-28 DIAGNOSIS — N920 Excessive and frequent menstruation with regular cycle: Secondary | ICD-10-CM

## 2024-04-28 NOTE — Progress Notes (Signed)
 Acute Office Visit  Subjective:     Patient ID: Frances Mccormick, female    DOB: [birth year], 38 y.o.   MRN: [id number]  Chief Complaint  Patient presents with   Hospitalization Follow-up    Hospital follow up from 04/20/2024 for vomiting and chest pains. Patient states she feels unusual since being taken off zofran , zoloft  and birth control.     HPI  Hospital follow-up for this 38 year old person living with Crohn's disease admitted with nausea vomiting and hospitalization complicated by acute myocarditis.  She had a upper respiratory tract infection, her young son was also sick with a respiratory tract infection.  She developed some chest discomfort in the hospital, thought it was due to the vomiting.  Troponin levels rose quickly to 2000.  She had an ischemic evaluation which was reassuring.  Cardiac MRI showed evidence of acute myocarditis.  The ESR and CRP inflammatory markers were essentially normal.  She was started on metoprolol , and told to stop taking Zoloft  and birth control because of a prolonged QT interval.  Since discharge she has been feeling well.  Eating and drinking well.  No shortness of breath, cough is resolved.  No further nausea or vomiting.  Returning to her usual functional status.  No chest pain.  She does report having dizziness on a daily basis, mostly when she stands up.  She is checked her blood pressure at home and found it to be as low as 96/50.  No syncope episodes, but she does not like the dizziness feeling.      Objective:    BP (!) 111/54   Pulse 67   Temp 97.9 F (36.6 C) (Temporal)   Wt 198 lb (89.8 kg)   SpO2 100%   BMI 37.41 kg/m    Physical Exam  Gen: Well-appearing Heart: regular, 2 out of 6 early systolic murmur best heard at the left upper sternal border Lungs: Unlabored, clear throughout with no crackles Ext: Warm, trace pitting edema in both lower extremities  EKG: Obtained due to recent QT prolongation in the setting of  myocarditis.  EKG today is normal sinus rhythm, normal axis, normal intervals, QT has shortened and is now normal at 450 ms.      Assessment & Plan:   Problem List Items Addressed This Visit       High   Depression, major, single episode, moderate (HCC) (Chronic)   Chronic and stable.  Was doing well with sertraline , had to stop it for a period of time because of a transient prolongation of the QT interval associated with acute myocarditis.  QT interval is now back to normal.  I think it is safe for her to restart Zoloft .        Medium    Menorrhagia (Chronic)   Patient held the use of birth control last 7 days after recent hospitalization for myocarditis.  Cardiac catheterization ruled out a infarction or embolic event.  I think it is safe for her to resume the OCP.  I recommended that she start a new pack and use barrier protection for at least the next cycle.        Unprioritized   Prolonged QT interval   Acute issue associated with acute myocarditis.  QT interval up to is much as 530 ms during hospitalization.  Today it is down to 446 ms.  Will restart sertraline  which was giving her good benefit for depression.      Relevant Orders   EKG 12-Lead  Acute myocarditis - Primary   Acute issue resulting in rosacea in last week.  Now resolving.  Currently asymptomatic.  Etiology seems most consistent with viral, as this happened while she was having upper respiratory tract infection.  Marijuana use is not clearly established to cause myocarditis.  Echocardiogram was structurally normal, cardiac MRI showed mildly decreased systolic function at 45%.  She appears euvolemic on exam and is having no symptoms of heart failure.  Anticipate this was due to the cardiac inflammation and is likely to resolve.  She was treated with metoprolol  and supportive care.  She is not tolerating metoprolol  well, having orthostatic dizziness, diastolic hypotension here in the office.  Will recommend stopping  metoprolol  now.       Ether Hercules, MD

## 2024-04-28 NOTE — Assessment & Plan Note (Signed)
 Patient held the use of birth control last 7 days after recent hospitalization for myocarditis.  Cardiac catheterization ruled out a infarction or embolic event.  I think it is safe for her to resume the OCP.  I recommended that she start a new pack and use barrier protection for at least the next cycle.

## 2024-04-28 NOTE — Assessment & Plan Note (Signed)
 Acute issue associated with acute myocarditis.  QT interval up to is much as 530 ms during hospitalization.  Today it is down to 446 ms.  Will restart sertraline  which was giving her good benefit for depression.

## 2024-04-28 NOTE — Assessment & Plan Note (Signed)
 Chronic and stable.  Was doing well with sertraline , had to stop it for a period of time because of a transient prolongation of the QT interval associated with acute myocarditis.  QT interval is now back to normal.  I think it is safe for her to restart Zoloft .

## 2024-04-28 NOTE — Assessment & Plan Note (Addendum)
 Acute issue resulting in rosacea in last week.  Now resolving.  Currently asymptomatic.  Etiology seems most consistent with viral, as this happened while she was having upper respiratory tract infection.  Marijuana use is not clearly established to cause myocarditis.  Echocardiogram was structurally normal, cardiac MRI showed mildly decreased systolic function at 45%.  She appears euvolemic on exam and is having no symptoms of heart failure.  Anticipate this was due to the cardiac inflammation and is likely to resolve.  She was treated with metoprolol  and supportive care.  She is not tolerating metoprolol  well, having orthostatic dizziness, diastolic hypotension here in the office.  Will recommend stopping metoprolol  now.

## 2024-04-28 NOTE — Progress Notes (Unsigned)
 Cardiology Office Note:    Date:  04/29/2024   ID:  Frances Mccormick, DOB 08-07-1986, MRN [id number]  PCP:  Ether Hercules, MD  Cardiologist:  Wendie Hamburg, MD  Electrophysiologist:  None   Referring MD: Ether Hercules   Chief Complaint  Patient presents with   myocarditis    History of Present Illness:    Frances Mccormick is a 38 y.o. female with a hx of myocarditis, Crohn's disease, hypertension who presents for follow-up.  She was admitted 03/2024 with nausea/vomiting, found to have elevated troponin to 2000.  Echocardiogram showed EF 50 to 55% but with regional wall motion abnormalities.  LHC showed normal coronary arteries.  Cardiac MRI showed acute myocarditis with regional elevations in myocardial T1/2 as well as nonischemic LGE pattern, LVEF 45%, RVEF 53%.   Since discharge from hospital, she reports she is doing well.  States that she was having significant lightheadedness on metoprolol , she was seen by Dr. Gayl Katos yesterday and metoprolol  was discontinued.  States that her SBP was running in the 90s on metoprolol .  She is feeling significantly improved since stopping metoprolol .  She denies any chest pain, dyspnea, lower extremity edema, or palpitations.  Past Medical History:  Diagnosis Date   Advanced maternal age in multigravida, second trimester 04/25/2022   Allergy    Bee venom   Anxiety    Chronic hypertension with superimposed preeclampsia 08/11/2018   Crohn's colitis (HCC)    Depression    Hidradenitis suppurativa    Hypertension    Hyperthyroidism affecting pregnancy in first trimester 01/12/2022   Repeat TFTs in 2nd trimester     Migraine    "a few/year" (07/27/2018)   Non-compliant patient    Preterm delivery after induction of labor 06/20/2022   Preterm premature rupture of membranes (PPROM) with onset of labor within 24 hours of rupture in third trimester, antepartum 06/19/2022   Supervision of high risk pregnancy, antepartum  12/16/2021              Nursing Staff    Provider      Office Location     [facility]    Dating     LMP,  9 week US       PNC Model    Galerius.Gant ] Traditional  [ ]  Centering  [ ]  Mom-Baby Dyad                Language     English    Anatomy US      Nl 19 weeks      Flu Vaccine      04/25/2022    Genetic/Carrier Screen     NIPS:   LR female  AFP:   *Screen Negative*  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   sertraline  (ZOLOFT ) 50 MG tablet Take 1 tablet (50 mg total) by mouth daily.     Allergies:   Bee venom and Ciprofloxacin    Social History  Socioeconomic History   Marital status: Single    Spouse name: Not on file   Number of children: Not on file   Years of education: Not on file   Highest education level: Some college, no degree  Occupational History   Not on file  Tobacco Use   Smoking status: Never   Smokeless tobacco: Never  Vaping Use   Vaping status: Never Used  Substance and Sexual Activity   Alcohol use: Not Currently    Alcohol/week: 2.0 standard drinks of alcohol    Types: 1 Glasses of wine, 1 Cans of beer per week    Comment: weekly   Drug use: Yes    Types: Marijuana   Sexual activity: Not Currently    Partners: Male    Birth control/protection: None  Other Topics Concern   Not on file  Social History Narrative   Not on file   Social Drivers of Health   Financial Resource Strain: Medium Risk (03/03/2024)   Overall Financial Resource Strain (CARDIA)    Difficulty of Paying Living Expenses:  Somewhat hard  Food Insecurity: No Food Insecurity (04/21/2024)   Hunger Vital Sign    Worried About Running Out of Food in the Last Year: Never true    Ran Out of Food in the Last Year: Never true  Transportation Needs: No Transportation Needs (04/21/2024)   PRAPARE - Administrator, Civil Service (Medical): No    Lack of Transportation (Non-Medical): No  Physical Activity: Insufficiently Active (03/03/2024)   Exercise Vital Sign    Days of Exercise per Week: 2 days    Minutes of Exercise per Session: 30 min  Stress: Stress Concern Present (03/03/2024)   [employer] of Occupational Health - Occupational Stress Questionnaire    Feeling of Stress : To some extent  Social Connections: Moderately Integrated (03/03/2024)   Social Connection and Isolation Panel [NHANES]    Frequency of Communication with Friends and Family: More than three times a week    Frequency of Social Gatherings with Friends and Family: Once a week    Attends Religious Services: More than 4 times per year    Active Member of [employer] or Organizations: Yes    Attends Engineer, structural: More than 4 times per year    Marital Status: Never married     Family History: The patient's family history includes Breast cancer in her maternal aunt; Diabetes in her father, maternal grandfather, and mother; Heart disease in her mother; Hypertension in her maternal grandfather, maternal grandmother, and mother; Stroke in her maternal grandmother.  ROS:   Please see the history of present illness.     All other systems reviewed and are negative.  EKGs/Labs/Other Studies Reviewed:    The following studies were reviewed today:   EKG:   04/29/2024: Normal sinus rhythm, poor R wave progression, rate 63, QTc 454  Recent Labs: 03/03/2024: TSH 0.89 04/20/2024: ALT 17 04/23/2024: BUN 19; Creatinine, Ser 1.01; Hemoglobin 15.6; Magnesium  2.3; Platelets 366; Potassium 3.7; Sodium 133  Recent Lipid Panel    Component  Value Date/Time   CHOL 212 (H) 03/03/2024 1407   TRIG 89.0 03/03/2024 1407   HDL 56.60 03/03/2024 1407   CHOLHDL 4 03/03/2024 1407   VLDL 17.8 03/03/2024 1407   LDLCALC 138 (H) 03/03/2024 1407    Physical Exam:    VS:  BP 118/60 (BP Location: Left Arm, Patient Position: Sitting)   Pulse 63   Ht 5\' 1"  (1.549 m)  Wt 202 lb (91.6 kg)   SpO2 99%   BMI 38.17 kg/m     Wt Readings from Last 3 Encounters:  04/29/24 202 lb (91.6 kg)  04/28/24 198 lb (89.8 kg)  04/20/24 200 lb (90.7 kg)     GEN:  Well nourished, well developed in no acute distress HEENT: Normal NECK: No JVD; No carotid bruits LYMPHATICS: No lymphadenopathy CARDIAC: RRR, no murmurs, rubs, gallops RESPIRATORY:  Clear to auscultation without rales, wheezing or rhonchi  ABDOMEN: Soft, non-tender, non-distended MUSCULOSKELETAL:  No edema; No deformity  SKIN: Warm and dry NEUROLOGIC:  Alert and oriented x 3 PSYCHIATRIC:  Normal affect   ASSESSMENT:    1. Acute viral myocarditis   2. QT prolongation    PLAN:    Acute myocarditis:She was admitted 03/2024 with nausea/vomiting, found to have elevated troponin to 2000.  Echocardiogram showed EF 50 to 55% but with regional wall motion abnormalities.  LHC showed normal coronary arteries.  Cardiac MRI showed acute myocarditis with regional elevations in myocardial T1/2 as well as nonischemic LGE pattern, LVEF 45%, RVEF 53%.  - She was started on Toprol -XL 25 mg daily but developed significant lightheadedness, has been discontinued with resolution of symptoms.  Soft BP limits GDMT -Plan repeat echocardiogram in 3 months - Discussed avoiding strenuous activity for next 3 months  QT prolongation: QTc 530 on EKG during hospitalization.  QTc today shows improvement, QTc down to 454.  Her Zoloft  was held on discharge from hospital but with resolution of QT prolongation can resume  RTC in 3 months  Medication Adjustments/Labs and Tests Ordered: Current medicines are reviewed  at length with the patient today.  Concerns regarding medicines are outlined above.  Orders Placed This Encounter  Procedures   EKG 12-Lead   ECHOCARDIOGRAM COMPLETE   No orders of the defined types were placed in this encounter.   Patient Instructions  Medication Instructions:  None *If you need a refill on your cardiac medications before your next appointment, please call your pharmacy*  Lab Work: None If you have labs (blood work) drawn today and your tests are completely normal, you will receive your results only by: MyChart Message (if you have MyChart) OR A paper copy in the mail If you have any lab test that is abnormal or we need to change your treatment, we will call you to review the results.  Testing/Procedures: Echocardiogram (in 3 months) Your physician has requested that you have an echocardiogram. Echocardiography is a painless test that uses sound waves to create images of your heart. It provides your doctor with information about the size and shape of your heart and how well your heart's chambers and valves are working. This procedure takes approximately one hour. There are no restrictions for this procedure. Please do NOT wear cologne, perfume, aftershave, or lotions (deodorant is allowed). Please arrive 15 minutes prior to your appointment time.  Please note: We ask at that you not bring children with you during ultrasound (echo/ vascular) testing. Due to room size and safety concerns, children are not allowed in the ultrasound rooms during exams. Our front office staff cannot provide observation of children in our lobby area while testing is being conducted. An adult accompanying a patient to their appointment will only be allowed in the ultrasound room at the discretion of the ultrasound technician under special circumstances. We apologize for any inconvenience.   Follow-Up: At [facility], you and your health needs are our priority.  As part of our  continuing mission to provide you with exceptional heart care, our providers are all part of one team.  This team includes your primary Cardiologist (physician) and Advanced Practice Providers or APPs (Physician Assistants and Nurse Practitioners) who all work together to provide you with the care you need, when you need it.  Your next appointment:   3 month(s)  Provider:   Wendie Hamburg, MD    Signed, Wendie Hamburg, MD  04/29/2024 10:18 AM    [facility]

## 2024-04-29 ENCOUNTER — Ambulatory Visit: Attending: Cardiology | Admitting: Cardiology

## 2024-04-29 ENCOUNTER — Encounter: Payer: Self-pay | Admitting: Cardiology

## 2024-04-29 VITALS — BP 118/60 | HR 63 | Ht 61.0 in | Wt 202.0 lb

## 2024-04-29 DIAGNOSIS — R9431 Abnormal electrocardiogram [ECG] [EKG]: Secondary | ICD-10-CM | POA: Insufficient documentation

## 2024-04-29 DIAGNOSIS — B3322 Viral myocarditis: Secondary | ICD-10-CM | POA: Diagnosis not present

## 2024-04-29 NOTE — Patient Instructions (Signed)
 Medication Instructions:  None *If you need a refill on your cardiac medications before your next appointment, please call your pharmacy*  Lab Work: None If you have labs (blood work) drawn today and your tests are completely normal, you will receive your results only by: MyChart Message (if you have MyChart) OR A paper copy in the mail If you have any lab test that is abnormal or we need to change your treatment, we will call you to review the results.  Testing/Procedures: Echocardiogram (in 3 months) Your physician has requested that you have an echocardiogram. Echocardiography is a painless test that uses sound waves to create images of your heart. It provides your doctor with information about the size and shape of your heart and how well your heart's chambers and valves are working. This procedure takes approximately one hour. There are no restrictions for this procedure. Please do NOT wear cologne, perfume, aftershave, or lotions (deodorant is allowed). Please arrive 15 minutes prior to your appointment time.  Please note: We ask at that you not bring children with you during ultrasound (echo/ vascular) testing. Due to room size and safety concerns, children are not allowed in the ultrasound rooms during exams. Our front office staff cannot provide observation of children in our lobby area while testing is being conducted. An adult accompanying a patient to their appointment will only be allowed in the ultrasound room at the discretion of the ultrasound technician under special circumstances. We apologize for any inconvenience.   Follow-Up: At [facility], you and your health needs are our priority.  As part of our continuing mission to provide you with exceptional heart care, our providers are all part of one team.  This team includes your primary Cardiologist (physician) and Advanced Practice Providers or APPs (Physician Assistants and Nurse Practitioners) who all work together  to provide you with the care you need, when you need it.  Your next appointment:   3 month(s)  Provider:   Wendie Hamburg, MD

## 2024-05-05 NOTE — Addendum Note (Signed)
 Addended by: Jacki Maryland R on: 05/05/2024 10:15 AM   Modules accepted: Orders

## 2024-07-17 ENCOUNTER — Emergency Department (HOSPITAL_BASED_OUTPATIENT_CLINIC_OR_DEPARTMENT_OTHER)
Admission: EM | Admit: 2024-07-17 | Discharge: 2024-07-17 | Disposition: A | Discharge status: Home / Self Care | Attending: Emergency Medicine | Admitting: Emergency Medicine

## 2024-07-17 ENCOUNTER — Emergency Department (HOSPITAL_BASED_OUTPATIENT_CLINIC_OR_DEPARTMENT_OTHER)

## 2024-07-17 ENCOUNTER — Encounter (HOSPITAL_BASED_OUTPATIENT_CLINIC_OR_DEPARTMENT_OTHER): Payer: Self-pay

## 2024-07-17 ENCOUNTER — Other Ambulatory Visit: Payer: Self-pay

## 2024-07-17 DIAGNOSIS — R1032 Left lower quadrant pain: Secondary | ICD-10-CM | POA: Diagnosis not present

## 2024-07-17 DIAGNOSIS — R1115 Cyclical vomiting syndrome unrelated to migraine: Secondary | ICD-10-CM | POA: Insufficient documentation

## 2024-07-17 DIAGNOSIS — R030 Elevated blood-pressure reading, without diagnosis of hypertension: Secondary | ICD-10-CM | POA: Diagnosis not present

## 2024-07-17 DIAGNOSIS — K429 Umbilical hernia without obstruction or gangrene: Secondary | ICD-10-CM | POA: Diagnosis not present

## 2024-07-17 DIAGNOSIS — I10 Essential (primary) hypertension: Secondary | ICD-10-CM | POA: Diagnosis not present

## 2024-07-17 DIAGNOSIS — D72829 Elevated white blood cell count, unspecified: Secondary | ICD-10-CM | POA: Diagnosis not present

## 2024-07-17 DIAGNOSIS — K449 Diaphragmatic hernia without obstruction or gangrene: Secondary | ICD-10-CM | POA: Diagnosis not present

## 2024-07-17 DIAGNOSIS — E876 Hypokalemia: Secondary | ICD-10-CM | POA: Diagnosis not present

## 2024-07-17 DIAGNOSIS — R112 Nausea with vomiting, unspecified: Secondary | ICD-10-CM | POA: Diagnosis present

## 2024-07-17 DIAGNOSIS — K5090 Crohn's disease, unspecified, without complications: Secondary | ICD-10-CM | POA: Diagnosis not present

## 2024-07-17 LAB — URINE DRUG SCREEN
Amphetamines: NOT DETECTED
Barbiturates: NOT DETECTED
Benzodiazepines: NOT DETECTED
Cocaine: NOT DETECTED
Fentanyl: NOT DETECTED
Methadone Scn, Ur: NOT DETECTED
Opiates: NOT DETECTED
Tetrahydrocannabinol: DETECTED — AB

## 2024-07-17 LAB — CBC
HCT: 48.1 % — ABNORMAL HIGH (ref 36.0–46.0)
Hemoglobin: 16.1 g/dL — ABNORMAL HIGH (ref 12.0–15.0)
MCH: 26.4 pg (ref 26.0–34.0)
MCHC: 33.5 g/dL (ref 30.0–36.0)
MCV: 78.9 fL — ABNORMAL LOW (ref 80.0–100.0)
Platelets: 327 K/uL (ref 150–400)
RBC: 6.1 MIL/uL — ABNORMAL HIGH (ref 3.87–5.11)
RDW: 15.5 % (ref 11.5–15.5)
WBC: 19.7 K/uL — ABNORMAL HIGH (ref 4.0–10.5)
nRBC: 0 % (ref 0.0–0.2)

## 2024-07-17 LAB — COMPREHENSIVE METABOLIC PANEL WITH GFR
ALT: 14 U/L (ref 0–44)
AST: 32 U/L (ref 15–41)
Albumin: 4.6 g/dL (ref 3.5–5.0)
Alkaline Phosphatase: 111 U/L (ref 38–126)
Anion gap: 18 — ABNORMAL HIGH (ref 5–15)
BUN: 5 mg/dL — ABNORMAL LOW (ref 6–20)
CO2: 19 mmol/L — ABNORMAL LOW (ref 22–32)
Calcium: 9.6 mg/dL (ref 8.9–10.3)
Chloride: 99 mmol/L (ref 98–111)
Creatinine, Ser: 0.71 mg/dL (ref 0.44–1.00)
GFR, Estimated: >60 mL/min (ref >60)
Glucose, Bld: 164 mg/dL — ABNORMAL HIGH (ref 70–99)
Potassium: 3.4 mmol/L — ABNORMAL LOW (ref 3.5–5.1)
Sodium: 136 mmol/L (ref 135–145)
Total Bilirubin: 0.4 mg/dL (ref 0.0–1.2)
Total Protein: 8.6 g/dL — ABNORMAL HIGH (ref 6.5–8.1)

## 2024-07-17 LAB — URINALYSIS, ROUTINE W REFLEX MICROSCOPIC
Bacteria, UA: NONE SEEN
Bilirubin Urine: NEGATIVE
Glucose, UA: 250 mg/dL — AB
Hgb urine dipstick: NEGATIVE
Ketones, ur: NEGATIVE mg/dL
Leukocytes,Ua: NEGATIVE
Nitrite: NEGATIVE
Specific Gravity, Urine: 1.008 (ref 1.005–1.030)
pH: 8 (ref 5.0–8.0)

## 2024-07-17 LAB — PREGNANCY, URINE: Preg Test, Ur: NEGATIVE

## 2024-07-17 LAB — ETHANOL: Alcohol, Ethyl (B): <15 mg/dL (ref <15)

## 2024-07-17 LAB — LIPASE, BLOOD: Lipase: 19 U/L (ref 11–51)

## 2024-07-17 LAB — MAGNESIUM: Magnesium: 1.9 mg/dL (ref 1.7–2.4)

## 2024-07-17 MED ORDER — ONDANSETRON HCL 4 MG/2ML IJ SOLN
4.0000 mg | Freq: Once | INTRAMUSCULAR | Status: AC
  Administered 2024-07-17: 4 mg via INTRAVENOUS
  Filled 2024-07-17: qty 2

## 2024-07-17 MED ORDER — POTASSIUM CHLORIDE 10 MEQ/100ML IV SOLN
10.0000 meq | Freq: Once | INTRAVENOUS | Status: AC
  Administered 2024-07-17: 10 meq via INTRAVENOUS
  Filled 2024-07-17: qty 100

## 2024-07-17 MED ORDER — DROPERIDOL 2.5 MG/ML IJ SOLN
1.2500 mg | Freq: Once | INTRAMUSCULAR | Status: AC
  Administered 2024-07-17: 1.25 mg via INTRAVENOUS
  Filled 2024-07-17: qty 2

## 2024-07-17 MED ORDER — IOHEXOL 300 MG/ML  SOLN
100.0000 mL | Freq: Once | INTRAMUSCULAR | Status: AC | PRN
  Administered 2024-07-17: 100 mL via INTRAVENOUS

## 2024-07-17 MED ORDER — METOCLOPRAMIDE HCL 10 MG PO TABS: 10.0000 mg | ORAL_TABLET | Freq: Four times a day (QID) | ORAL | 0 refills | Status: DC

## 2024-07-17 MED ORDER — MORPHINE SULFATE (PF) 4 MG/ML IV SOLN
4.0000 mg | Freq: Once | INTRAVENOUS | Status: AC
  Administered 2024-07-17: 4 mg via INTRAVENOUS
  Filled 2024-07-17: qty 1

## 2024-07-17 MED ORDER — SODIUM CHLORIDE 0.9 % IV SOLN: INTRAVENOUS | Status: DC | PRN

## 2024-07-17 MED ORDER — LACTATED RINGERS IV BOLUS
1000.0000 mL | Freq: Once | INTRAVENOUS | Status: AC
Start: 2024-07-17 — End: 2024-07-17
  Administered 2024-07-17: 1000 mL via INTRAVENOUS

## 2024-07-17 MED ORDER — HYDRALAZINE HCL 20 MG/ML IJ SOLN
10.0000 mg | Freq: Once | INTRAMUSCULAR | Status: AC
  Administered 2024-07-17: 10 mg via INTRAVENOUS
  Filled 2024-07-17: qty 1

## 2024-07-17 NOTE — Discharge Instructions (Signed)
 You are seen in the emergency department today for concerns of nausea and vomiting.  Your labs and region were thankfully reassuring with only mild findings of dehydration seen.  Your CT of your abdomen was thankfully negative but there was a small finding of a hiatal hernia and an umbilical hernia.  These are likely not causing your symptoms.  I would recommend avoiding alcohol, marijuana, and any other substance that may possibly trigger nausea for you.  Over the next Avril days, try to slowly increase your oral intake with primarily clear liquids and slowly progressing to solid foods.  Return to the emergency department for any concerns of new or worsening symptoms.

## 2024-07-17 NOTE — ED Notes (Signed)
 ED Provider at bedside.

## 2024-07-17 NOTE — ED Notes (Signed)
 Patient transported to CT

## 2024-07-17 NOTE — ED Notes (Addendum)
 Registration called on radio that there is a fall in the lobby. Nursing staff went into lobby to find pt sitting on floor infront of wheelchair. Pt had not yet been triaged. Pt was alert denying injury. Pt was assisted back into wheelchair and moved to triage 1. Pt and VS assessed as noted. EDP made aware. Security camera was reviewed and appeared that pt slowly rolled herself out of the wheelchair landing on her feet and buttocks without hitting her head or losing consciousness.

## 2024-07-17 NOTE — ED Provider Notes (Signed)
 [place] EMERGENCY DEPARTMENT AT [facility] Provider Note   CSN: [id number] Arrival date & time: 07/17/24  1011     Patient presents with: Emesis   Frances Mccormick is a 38 y.o. female.  Patient with past history significant for Crohn's disease, prolonged QT presents to the emergency department with concerns of vomiting.  Reports that she had onset of nausea, vomiting, and lower abdominal pain starting this morning.  Has not been able to tolerate p.o.  Denies any hematemesis or blood streaking.  No reported diarrhea.  No fever, chills or bodyaches but patient is notably diaphoretic on arrival.  Patient did have a questionable syncopal episode while in the wheelchair in the waiting room.  She reportedly was on the ground but does not report pain in her head, neck, or upper or lower extremities and only endorses pain to the lower abdomen that is present prior to arriving the emergency department.   Emesis      Prior to Admission medications   Medication Sig Start Date End Date Taking? Authorizing Provider  metoCLOPramide  (REGLAN ) 10 MG tablet Take 1 tablet (10 mg total) by mouth every 6 (six) hours. 07/17/24  Yes Tamikia Chowning A, PA-C  Norethindrone  Acetate-Ethinyl Estrad-FE (LOESTRIN 24 FE) 1-20 MG-MCG(24) tablet Take 1 tablet by mouth daily. 03/03/24   Jerrell Cleatus Ned, MD  sertraline  (ZOLOFT ) 50 MG tablet Take 1 tablet (50 mg total) by mouth daily. 03/03/24   Jerrell Cleatus Ned, MD    Allergies: Bee venom and Ciprofloxacin     Review of Systems  Gastrointestinal:  Positive for vomiting.  All other systems reviewed and are negative.   Updated Vital Signs BP (!) 177/114   Pulse 100   Temp 98.9 F (37.2 C) (Oral)   Resp 13   Ht 5' 1 (1.549 m)   Wt 90.7 kg   SpO2 100%   BMI 37.79 kg/m   Physical Exam Vitals and nursing note reviewed.  Constitutional:      General: She is not in acute distress.    Appearance: She is well-developed.  HENT:     Head:  Normocephalic and atraumatic.  Eyes:     Conjunctiva/sclera: Conjunctivae normal.  Cardiovascular:     Rate and Rhythm: Normal rate and regular rhythm.     Heart sounds: No murmur heard. Pulmonary:     Effort: Pulmonary effort is normal. No respiratory distress.     Breath sounds: Normal breath sounds.  Abdominal:     General: Abdomen is flat. Bowel sounds are normal. There is no distension.     Palpations: Abdomen is soft. There is no mass.     Tenderness: There is abdominal tenderness. There is no guarding.  Musculoskeletal:        General: No swelling.     Cervical back: Neck supple.  Skin:    General: Skin is warm and dry.     Capillary Refill: Capillary refill takes less than 2 seconds.  Neurological:     Mental Status: She is alert.  Psychiatric:        Mood and Affect: Mood normal.     (all labs ordered are listed, but only abnormal results are displayed) Labs Reviewed  COMPREHENSIVE METABOLIC PANEL WITH GFR - Abnormal; Notable for the following components:      Result Value   Potassium 3.4 (*)    CO2 19 (*)    Glucose, Bld 164 (*)    BUN 5 (*)    Total Protein 8.6 (*)  Anion gap 18 (*)    All other components within normal limits  CBC - Abnormal; Notable for the following components:   WBC 19.7 (*)    RBC 6.10 (*)    Hemoglobin 16.1 (*)    HCT 48.1 (*)    MCV 78.9 (*)    All other components within normal limits  URINALYSIS, ROUTINE W REFLEX MICROSCOPIC - Abnormal; Notable for the following components:   Color, Urine COLORLESS (*)    Glucose, UA 250 (*)    Protein, ur TRACE (*)    All other components within normal limits  URINE DRUG SCREEN - Abnormal; Notable for the following components:   Tetrahydrocannabinol DETECTED (*)    All other components within normal limits  LIPASE, BLOOD  PREGNANCY, URINE  ETHANOL  MAGNESIUM     EKG: None  Radiology: CT ABDOMEN PELVIS W CONTRAST Result Date: 07/17/2024 CLINICAL DATA:  LLQ abdominal pain RLQ  abdominal pain Hx crohn's disease EXAM: CT ABDOMEN AND PELVIS WITH CONTRAST TECHNIQUE: Multidetector CT imaging of the abdomen and pelvis was performed using the standard protocol following bolus administration of intravenous contrast. RADIATION DOSE REDUCTION: This exam was performed according to the departmental dose-optimization program which includes automated exposure control, adjustment of the mA and/or kV according to patient size and/or use of iterative reconstruction technique. CONTRAST:  OMNIPAQUE  IOHEXOL  300 MG/ML  SOLN COMPARISON:  CT abdomen pelvis 02/07/2024 FINDINGS: Lower chest: Small hiatal hernia.  No acute abnormality. Hepatobiliary: No focal liver abnormality. No gallstones, gallbladder wall thickening, or pericholecystic fluid. No biliary dilatation. Pancreas: No focal lesion. Normal pancreatic contour. No surrounding inflammatory changes. No main pancreatic ductal dilatation. Spleen: Normal in size without focal abnormality. Adrenals/Urinary Tract: No adrenal nodule bilaterally. Bilateral kidneys enhance symmetrically. No hydronephrosis. No hydroureter. The urinary bladder is unremarkable. Stomach/Bowel: Stomach is within normal limits. No evidence of bowel wall thickening or dilatation. Appendix appears normal. Vascular/Lymphatic: No abdominal aorta or iliac aneurysm. Mild atherosclerotic plaque of the aorta and its branches. No abdominal, pelvic, or inguinal lymphadenopathy. Reproductive: Suggestion of the right ovary along the posterior in wall of the uterus (2:61) versus intramural uterine fibroid. Uterus and bilateral adnexal regions are unremarkable. Other: No intraperitoneal free fluid. No intraperitoneal free gas. No organized fluid collection. Musculoskeletal: Tiny fat containing umbilical hernia. No suspicious lytic or blastic osseous lesions. No acute displaced fracture. IMPRESSION: 1. Small hiatal hernia. 2. Tiny fat containing umbilical hernia. 3. No acute intra-abdominal or  intrapelvic abnormality. Electronically Signed   By: Morgane  Naveau M.D.   On: 07/17/2024 14:35     Procedures   Medications Ordered in the ED  0.9 %  sodium chloride  infusion (0 mLs Intravenous Stopped 07/17/24 1710)  lactated ringers  bolus 1,000 mL (0 mLs Intravenous Stopped 07/17/24 1420)  morphine  (PF) 4 MG/ML injection 4 mg (4 mg Intravenous Given 07/17/24 1240)  ondansetron  (ZOFRAN ) injection 4 mg (4 mg Intravenous Given 07/17/24 1239)  iohexol  (OMNIPAQUE ) 300 MG/ML solution 100 mL (100 mLs Intravenous Contrast Given 07/17/24 1338)  droperidol  (INAPSINE ) 2.5 MG/ML injection 1.25 mg (1.25 mg Intravenous Given 07/17/24 1439)  potassium chloride  10 mEq in 100 mL IVPB (0 mEq Intravenous Stopped 07/17/24 1710)  hydrALAZINE  (APRESOLINE ) injection 10 mg (10 mg Intravenous Given 07/17/24 1524)                                    Medical Decision Making Amount and/or Complexity of Data  Reviewed Labs: ordered. Radiology: ordered.  Risk Prescription drug management.   This patient presents to the ED for concern of nausea and vomiting, this involves an extensive number of treatment options, and is a complaint that carries with it a high risk of complications and morbidity.  The differential diagnosis includes Crohn's exacerbation, gastritis, bowel obstruction, alcohol intoxication, cannabis use   Co morbidities that complicate the patient evaluation  Crohn's disease, obesity, prolonged QT interval   Lab Tests:  I Ordered, and personally interpreted labs.  The pertinent results include: CBC with leukocytosis at 90.7 and hemoglobin elevated at 16.1 indicating likely hemoconcentration but patient's white count is consistent with baseline, CMP mild hypokalemia 3.4 and an elevated anion gap at 18, UA negative for signs of infection, UDS positive for cannabis, urine pregnancy negative, lipase unremarkable at 19, ethanol negative, magnesium  normal at 1.9   Imaging Studies ordered:  I ordered  imaging studies including CT abdomen pelvis I independently visualized and interpreted imaging which showed small hiatal hernias but no other obvious abnormality I agree with the radiologist interpretation   Cardiac Monitoring: / EKG:  The patient was maintained on a cardiac monitor.  I personally viewed and interpreted the cardiac monitored which showed an underlying rhythm of: Sinus rhythm with slightly borderline prolonged Qtc at 485   Consultations Obtained:  I requested consultation with none,  and discussed lab and imaging findings as well as pertinent plan - they recommend: N/A   Problem List / ED Course / Critical interventions / Medication management  Patient with past history significant for Crohn's disease, obesity, prolonged QT interval presents ED with concerns of nausea and vomiting.  Reports that his began this morning sudden onset.  Dors is pain in her lower abdomen.  She is concerned that she may have a Crohn's flareup ongoing at this time.  No reported fever, chills, or bodyaches but does endorse some diaphoresis.  No sick contacts as far she is aware. On exam, patient is notably hypertensive and diaphoretic.  Pulse respiratory rate unremarkable.  Afebrile at 98.1 F.  She is tender on exam to the lower abdomen but no abdominal distention or abnormal bowel sounds.  No abnormal heart or lung sounds.  Given Crohn's history, will evaluate CT imaging and obtain basic labs for further assessment.  Will initiate symptomatic management with fluids, morphine , and Zofran .  May need to attempt other antiemetics given patient's reported history of cannabis use recently.  Unclear if this could be cannabis hyperemesis syndrome. Lab workup largely reassuring with patient's leukocytosis fairly consistent with baseline.  Hemoglobin elevated as well at 16.1 indicating possible hemoconcentration.  CMP without obvious signs of dehydration but there is some elevated anion gap likely secondary to  patient's vomiting.  Will replete with fluids potassium given patient's potassium at 3.4.  Add on magnesium  to assess for any concurrent hypomagnesemia.  UDS positive for cannabis.  Concern for possible cyclic vomiting syndrome.  No significant treatment after morphine  and Zofran .  Will trial droperidol .  Droperidol  1.25 mg ordered given patient's borderline prolonged QTc.  Patient noted also some antihypertensives and will be given a dose of hydralazine .  Not current on any blood pressure medications.  Potassium be repleted with IV potassium given patient not tolerating p.o. at this time. On reevaluation after droperidol , patient reports significant proving.  Denies any abdominal pain or any nausea at this time.  CT abdomen pelvis negative for any acute findings.  Suspect symptoms are likely secondary to cyclic vomiting syndrome.  Advised patient to discontinue use of cannabis, alcohol, or any other substance that they be triggering the nausea and vomiting for her.  Return precautions discussed such as concerns for new or worsening symptoms.  Otherwise encourage patient to follow-up with gastroenterology for further assessment.  Discharged home in stable condition with prescription for Reglan  sent to pharmacy. I ordered medication including fluids, Zofran , morphine , droperidol , hydralazine , potassium chloride  for hydration, nausea, pain, elevated blood pressure, hypokalemia Reevaluation of the patient after these medicines showed that the patient improved I have reviewed the patients home medicines and have made adjustments as needed   Social Determinants of Health:  None   Test / Admission - Considered:  Admission considered but patient stable for outpatient follow-up.  Final diagnoses:  Cyclic vomiting syndrome  Elevated blood pressure reading  Hypokalemia    ED Discharge Orders          Ordered    metoCLOPramide  (REGLAN ) 10 MG tablet  Every 6 hours        07/17/24 1626                Gillis Boardley A, PA-C 07/17/24 1739    Tonia Chew, MD 07/18/24 1045

## 2024-07-18 ENCOUNTER — Encounter (HOSPITAL_BASED_OUTPATIENT_CLINIC_OR_DEPARTMENT_OTHER): Payer: Self-pay | Admitting: Emergency Medicine

## 2024-07-18 ENCOUNTER — Emergency Department (HOSPITAL_BASED_OUTPATIENT_CLINIC_OR_DEPARTMENT_OTHER)
Admission: EM | Admit: 2024-07-18 | Discharge: 2024-07-18 | Disposition: A | Discharge status: Home / Self Care | Attending: Emergency Medicine | Admitting: Emergency Medicine

## 2024-07-18 ENCOUNTER — Other Ambulatory Visit: Payer: Self-pay

## 2024-07-18 DIAGNOSIS — E876 Hypokalemia: Secondary | ICD-10-CM | POA: Insufficient documentation

## 2024-07-18 DIAGNOSIS — R1084 Generalized abdominal pain: Secondary | ICD-10-CM | POA: Diagnosis not present

## 2024-07-18 DIAGNOSIS — R000 Tachycardia, unspecified: Secondary | ICD-10-CM | POA: Diagnosis not present

## 2024-07-18 DIAGNOSIS — R112 Nausea with vomiting, unspecified: Secondary | ICD-10-CM | POA: Insufficient documentation

## 2024-07-18 LAB — CBC WITH DIFFERENTIAL/PLATELET
Abs Immature Granulocytes: 0.11 K/uL — ABNORMAL HIGH (ref 0.00–0.07)
Basophils Absolute: 0.1 K/uL (ref 0.0–0.1)
Basophils Relative: 0 %
Eosinophils Absolute: 0 K/uL (ref 0.0–0.5)
Eosinophils Relative: 0 %
HCT: 49.3 % — ABNORMAL HIGH (ref 36.0–46.0)
Hemoglobin: 16.7 g/dL — ABNORMAL HIGH (ref 12.0–15.0)
Immature Granulocytes: 1 %
Lymphocytes Relative: 7 %
Lymphs Abs: 1.6 K/uL (ref 0.7–4.0)
MCH: 26.3 pg (ref 26.0–34.0)
MCHC: 33.9 g/dL (ref 30.0–36.0)
MCV: 77.5 fL — ABNORMAL LOW (ref 80.0–100.0)
Monocytes Absolute: 1.1 K/uL — ABNORMAL HIGH (ref 0.1–1.0)
Monocytes Relative: 5 %
Neutro Abs: 19 K/uL — ABNORMAL HIGH (ref 1.7–7.7)
Neutrophils Relative %: 87 %
Platelets: 358 K/uL (ref 150–400)
RBC: 6.36 MIL/uL — ABNORMAL HIGH (ref 3.87–5.11)
RDW: 15.6 % — ABNORMAL HIGH (ref 11.5–15.5)
WBC: 21.9 K/uL — ABNORMAL HIGH (ref 4.0–10.5)
nRBC: 0 % (ref 0.0–0.2)

## 2024-07-18 LAB — URINALYSIS, ROUTINE W REFLEX MICROSCOPIC
Bacteria, UA: NONE SEEN
Bilirubin Urine: NEGATIVE
Glucose, UA: NEGATIVE mg/dL
Ketones, ur: 40 mg/dL — AB
Leukocytes,Ua: NEGATIVE
Nitrite: NEGATIVE
Protein, ur: 100 mg/dL — AB
Specific Gravity, Urine: 1.019 (ref 1.005–1.030)
pH: 6 (ref 5.0–8.0)

## 2024-07-18 LAB — COMPREHENSIVE METABOLIC PANEL WITH GFR
ALT: 13 U/L (ref 0–44)
AST: 30 U/L (ref 15–41)
Albumin: 4.9 g/dL (ref 3.5–5.0)
Alkaline Phosphatase: 110 U/L (ref 38–126)
Anion gap: 19 — ABNORMAL HIGH (ref 5–15)
BUN: 13 mg/dL (ref 6–20)
CO2: 21 mmol/L — ABNORMAL LOW (ref 22–32)
Calcium: 10.1 mg/dL (ref 8.9–10.3)
Chloride: 97 mmol/L — ABNORMAL LOW (ref 98–111)
Creatinine, Ser: 1.19 mg/dL — ABNORMAL HIGH (ref 0.44–1.00)
GFR, Estimated: 60 mL/min — ABNORMAL LOW (ref >60)
Glucose, Bld: 114 mg/dL — ABNORMAL HIGH (ref 70–99)
Potassium: 3 mmol/L — ABNORMAL LOW (ref 3.5–5.1)
Sodium: 136 mmol/L (ref 135–145)
Total Bilirubin: 0.6 mg/dL (ref 0.0–1.2)
Total Protein: 9.2 g/dL — ABNORMAL HIGH (ref 6.5–8.1)

## 2024-07-18 LAB — LIPASE, BLOOD: Lipase: 21 U/L (ref 11–51)

## 2024-07-18 LAB — TROPONIN T, HIGH SENSITIVITY
Troponin T High Sensitivity: 20 ng/L — ABNORMAL HIGH (ref 0–19)
Troponin T High Sensitivity: 18 ng/L (ref 0–19)

## 2024-07-18 MED ORDER — SODIUM CHLORIDE 0.9 % IV BOLUS
1000.0000 mL | Freq: Once | INTRAVENOUS | Status: AC
  Administered 2024-07-18: 1000 mL via INTRAVENOUS

## 2024-07-18 MED ORDER — FENTANYL CITRATE PF 50 MCG/ML IJ SOSY
50.0000 ug | PREFILLED_SYRINGE | Freq: Once | INTRAMUSCULAR | Status: AC
  Administered 2024-07-18: 50 ug via INTRAVENOUS
  Filled 2024-07-18: qty 1

## 2024-07-18 MED ORDER — PROMETHAZINE HCL 25 MG RE SUPP: 25.0000 mg | Freq: Four times a day (QID) | RECTAL | 0 refills | Status: DC | PRN

## 2024-07-18 MED ORDER — POTASSIUM CHLORIDE 10 MEQ/100ML IV SOLN
10.0000 meq | Freq: Once | INTRAVENOUS | Status: AC
  Administered 2024-07-18: 10 meq via INTRAVENOUS
  Filled 2024-07-18: qty 100

## 2024-07-18 MED ORDER — HYDROMORPHONE HCL 1 MG/ML IJ SOLN
0.5000 mg | Freq: Once | INTRAMUSCULAR | Status: AC
  Administered 2024-07-18: 0.5 mg via INTRAVENOUS
  Filled 2024-07-18: qty 1

## 2024-07-18 MED ORDER — SODIUM CHLORIDE 0.9 % IV BOLUS
1000.0000 mL | Freq: Once | INTRAVENOUS | Status: AC
Start: 2024-07-18 — End: 2024-07-18
  Administered 2024-07-18: 1000 mL via INTRAVENOUS

## 2024-07-18 MED ORDER — OXYCODONE-ACETAMINOPHEN 5-325 MG PO TABS: 1.0000 | ORAL_TABLET | Freq: Four times a day (QID) | ORAL | 0 refills | Status: DC | PRN

## 2024-07-18 MED ORDER — ONDANSETRON HCL 4 MG/2ML IJ SOLN
4.0000 mg | Freq: Once | INTRAMUSCULAR | Status: AC
  Administered 2024-07-18: 4 mg via INTRAVENOUS
  Filled 2024-07-18: qty 2

## 2024-07-18 MED ORDER — POTASSIUM CHLORIDE CRYS ER 20 MEQ PO TBCR: 20.0000 meq | EXTENDED_RELEASE_TABLET | Freq: Two times a day (BID) | ORAL | 0 refills | Status: DC

## 2024-07-18 NOTE — ED Provider Notes (Signed)
 [place] EMERGENCY DEPARTMENT AT [facility] Provider Note   CSN: [id number] Arrival date & time: 07/18/24  1633     Patient presents with: Abdominal Pain   Frances Mccormick is a 38 y.o. female.   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  Patient presented to the emergency department yesterday, had CT scan which was reassuring, treated with droperidol , morphine , was improved but was very restless upon returning home.  She did not vomit last night but vomiting started again this morning and afternoon after eating a popsicle and drinking some fluid.  She has had generalized abdominal pain.  No diarrhea.  No reported fevers.  Medications at home have not been helping.       Prior to Admission medications   Medication Sig Start Date End Date Taking? Authorizing Provider  metoCLOPramide  (REGLAN ) 10 MG tablet Take 1 tablet (10 mg total) by mouth every 6 (six) hours. 07/17/24   Zelaya, Oscar A, PA-C  Norethindrone  Acetate-Ethinyl Estrad-FE (LOESTRIN 24 FE) 1-20 MG-MCG(24) tablet Take 1 tablet by mouth daily. 03/03/24   Jerrell Cleatus Ned, MD  sertraline  (ZOLOFT ) 50 MG tablet Take 1 tablet (50 mg total) by mouth daily. 03/03/24   Jerrell Cleatus Ned, MD    Allergies: Bee venom and Ciprofloxacin     Review of Systems  Updated Vital Signs BP (!) 196/111   Pulse (!) 132   Temp 98.6 F (37 C) (Oral)   Resp (!) 25   LMP 07/11/2024 (Approximate)   SpO2 100%   Physical Exam Vitals and nursing note reviewed.  Constitutional:      General: She is not in acute distress.    Appearance: She is well-developed.  HENT:     Head: Normocephalic and atraumatic.     Right Ear: External ear normal.     Left Ear: External ear normal.     Nose:  Nose normal.  Eyes:     Conjunctiva/sclera: Conjunctivae normal.  Cardiovascular:     Rate and Rhythm: Regular rhythm. Tachycardia present.     Heart sounds: No murmur heard. Pulmonary:     Effort: No respiratory distress.     Breath sounds: No wheezing, rhonchi or rales.  Abdominal:     Palpations: Abdomen is soft.     Tenderness: There is generalized abdominal tenderness. There is no guarding or rebound.  Musculoskeletal:     Cervical back: Normal range of motion and neck supple.     Right lower leg: No edema.     Left lower leg: No edema.  Skin:    General: Skin is warm and dry.     Findings: No rash.  Neurological:     General: No focal deficit present.     Mental Status: She is alert. Mental status is at baseline.     Motor: No weakness.  Psychiatric:        Mood and Affect: Mood normal.     (all labs ordered are listed, but only abnormal results are displayed) Labs Reviewed  CBC WITH DIFFERENTIAL/PLATELET  COMPREHENSIVE METABOLIC PANEL WITH GFR  LIPASE, BLOOD  URINALYSIS, ROUTINE W REFLEX MICROSCOPIC    EKG: None  Radiology: CT ABDOMEN PELVIS W CONTRAST Result Date: 07/17/2024 CLINICAL DATA:  LLQ abdominal pain RLQ abdominal pain Hx crohn's disease EXAM: CT ABDOMEN AND PELVIS  WITH CONTRAST TECHNIQUE: Multidetector CT imaging of the abdomen and pelvis was performed using the standard protocol following bolus administration of intravenous contrast. RADIATION DOSE REDUCTION: This exam was performed according to the departmental dose-optimization program which includes automated exposure control, adjustment of the mA and/or kV according to patient size and/or use of iterative reconstruction technique. CONTRAST:  OMNIPAQUE  IOHEXOL  300 MG/ML  SOLN COMPARISON:  CT abdomen pelvis 02/07/2024 FINDINGS: Lower chest: Small hiatal hernia.  No acute abnormality. Hepatobiliary: No focal liver abnormality. No gallstones, gallbladder wall thickening, or pericholecystic fluid. No  biliary dilatation. Pancreas: No focal lesion. Normal pancreatic contour. No surrounding inflammatory changes. No main pancreatic ductal dilatation. Spleen: Normal in size without focal abnormality. Adrenals/Urinary Tract: No adrenal nodule bilaterally. Bilateral kidneys enhance symmetrically. No hydronephrosis. No hydroureter. The urinary bladder is unremarkable. Stomach/Bowel: Stomach is within normal limits. No evidence of bowel wall thickening or dilatation. Appendix appears normal. Vascular/Lymphatic: No abdominal aorta or iliac aneurysm. Mild atherosclerotic plaque of the aorta and its branches. No abdominal, pelvic, or inguinal lymphadenopathy. Reproductive: Suggestion of the right ovary along the posterior in wall of the uterus (2:61) versus intramural uterine fibroid. Uterus and bilateral adnexal regions are unremarkable. Other: No intraperitoneal free fluid. No intraperitoneal free gas. No organized fluid collection. Musculoskeletal: Tiny fat containing umbilical hernia. No suspicious lytic or blastic osseous lesions. No acute displaced fracture. IMPRESSION: 1. Small hiatal hernia. 2. Tiny fat containing umbilical hernia. 3. No acute intra-abdominal or intrapelvic abnormality. Electronically Signed   By: Morgane  Naveau M.D.   On: 07/17/2024 14:35     Procedures   Medications Ordered in the ED  fentaNYL  (SUBLIMAZE ) injection 50 mcg (50 mcg Intravenous Given 07/18/24 1852)  sodium chloride  0.9 % bolus 1,000 mL (1,000 mLs Intravenous New Bag/Given 07/18/24 1850)   ED Course  Patient seen and examined. History obtained directly from patient. Work-up including labs, imaging, EKG ordered in triage, if performed, were reviewed.  Reviewed lab workup from yesterday as well as CT imaging results.  Labs/EKG: Independently reviewed and interpreted.  This included: CBC, CMP, lipase, UA.  Pregnancy yesterday was negative.  Given tachycardia, history of myocarditis with similar symptoms, troponin  ordered.  Imaging: None ordered  Medications/Fluids: Ordered: Fentanyl , fluid bolus  Most recent vital signs reviewed and are as follows: BP (!) 196/111   Pulse (!) 132   Temp 98.6 F (37 C) (Oral)   Resp (!) 25   LMP 07/11/2024 (Approximate)   SpO2 100%   Initial impression: Recurrent nausea and vomiting, history of cyclic vomiting spells and Crohn's disease.  7:00 PM Signout to [place] PA-C at shift change.                                    Medical Decision Making Amount and/or Complexity of Data Reviewed Labs: ordered.  Risk Prescription drug management.   For this patient's complaint of abdominal pain, the following conditions were considered on the differential diagnosis: gastritis/PUD, enteritis/duodenitis, appendicitis, cholelithiasis/cholecystitis, cholangitis, pancreatitis, ruptured viscus, colitis, diverticulitis, small/large bowel obstruction, proctitis, cystitis, pyelonephritis, ureteral colic, aortic dissection, aortic aneurysm. In women, ectopic pregnancy, pelvic inflammatory disease, ovarian cysts, and tubo-ovarian abscess were also considered. Atypical chest etiologies were also considered including ACS, PE, and pneumonia.      Final diagnoses:  Nausea and vomiting, unspecified vomiting type    ED Discharge Orders     None  Desiderio Chew, PA-C 07/18/24 1901    Jerrol Agent, MD 07/18/24 2203

## 2024-07-18 NOTE — ED Provider Notes (Addendum)
  Physical Exam  BP (!) 196/111   Pulse (!) 132   Temp 98.6 F (37 C) (Oral)   Resp (!) 25   LMP 07/11/2024 (Approximate)   SpO2 100%   Physical Exam  Procedures  Procedures  ED Course / MDM    Medical Decision Making Amount and/or Complexity of Data Reviewed Labs: ordered.  Risk Prescription drug management.   Seen here yesterday H/o crohn's CT yesterday negative Here with recurrent vomiting.  No fever  19:40 - zofran  given for nausea with improvement. She complains of pain that is uncontrolled. Dilaudid  ordered.   2250 - tolerating PO challenge. Feels better. Troponin x 2 negative. Pain improved. She is comfortable with discharge home. Rx for Percocet, phenergan  suppository, potassium provided. GI referral also provided.        Odell Balls, PA-C 07/18/24 1949    Odell Balls, PA-C 07/18/24 MITCHEAL    Jerrol Agent, MD 07/18/24 2350

## 2024-07-18 NOTE — ED Notes (Signed)
 Pt given discharge instructions. Opportunities given for questions. IV removed. Pt stable at time of discharge.

## 2024-07-18 NOTE — ED Triage Notes (Signed)
 Abdo pain, vomiting, sweating Was seen last night  Symptom returned around 3:30pm

## 2024-07-18 NOTE — Discharge Instructions (Addendum)
 As we discussed, follow up with Gastroenterology for further evaluation and management of recurrent nausea and vomiting with abdominal pain.   Take the medication as prescribed to control symptoms and to normalize your potassium. Return to the ED with any new or concerning symptoms.

## 2024-07-20 ENCOUNTER — Observation Stay (HOSPITAL_BASED_OUTPATIENT_CLINIC_OR_DEPARTMENT_OTHER)
Admission: EM | Admit: 2024-07-20 | Discharge: 2024-07-22 | Disposition: A | Discharge status: Home / Self Care | Attending: Internal Medicine | Admitting: Internal Medicine

## 2024-07-20 ENCOUNTER — Encounter (HOSPITAL_BASED_OUTPATIENT_CLINIC_OR_DEPARTMENT_OTHER): Payer: Self-pay | Admitting: Emergency Medicine

## 2024-07-20 ENCOUNTER — Other Ambulatory Visit: Payer: Self-pay

## 2024-07-20 DIAGNOSIS — R112 Nausea with vomiting, unspecified: Principal | ICD-10-CM | POA: Diagnosis present

## 2024-07-20 DIAGNOSIS — E876 Hypokalemia: Secondary | ICD-10-CM | POA: Insufficient documentation

## 2024-07-20 DIAGNOSIS — F321 Major depressive disorder, single episode, moderate: Secondary | ICD-10-CM | POA: Diagnosis not present

## 2024-07-20 DIAGNOSIS — D750 Familial erythrocytosis: Secondary | ICD-10-CM | POA: Insufficient documentation

## 2024-07-20 DIAGNOSIS — I10 Essential (primary) hypertension: Secondary | ICD-10-CM | POA: Insufficient documentation

## 2024-07-20 DIAGNOSIS — E860 Dehydration: Secondary | ICD-10-CM

## 2024-07-20 DIAGNOSIS — D7289 Other specified disorders of white blood cells: Secondary | ICD-10-CM | POA: Insufficient documentation

## 2024-07-20 DIAGNOSIS — Z8679 Personal history of other diseases of the circulatory system: Secondary | ICD-10-CM | POA: Diagnosis not present

## 2024-07-20 DIAGNOSIS — F12188 Cannabis abuse with other cannabis-induced disorder: Secondary | ICD-10-CM | POA: Diagnosis not present

## 2024-07-20 DIAGNOSIS — K5090 Crohn's disease, unspecified, without complications: Secondary | ICD-10-CM | POA: Diagnosis not present

## 2024-07-20 DIAGNOSIS — D72829 Elevated white blood cell count, unspecified: Secondary | ICD-10-CM | POA: Insufficient documentation

## 2024-07-20 DIAGNOSIS — D751 Secondary polycythemia: Secondary | ICD-10-CM | POA: Diagnosis present

## 2024-07-20 DIAGNOSIS — R1115 Cyclical vomiting syndrome unrelated to migraine: Secondary | ICD-10-CM | POA: Diagnosis not present

## 2024-07-20 DIAGNOSIS — R1084 Generalized abdominal pain: Secondary | ICD-10-CM | POA: Diagnosis not present

## 2024-07-20 LAB — CBC WITH DIFFERENTIAL/PLATELET
Abs Immature Granulocytes: 0.1 K/uL — ABNORMAL HIGH (ref 0.00–0.07)
Basophils Absolute: 0.1 K/uL (ref 0.0–0.1)
Basophils Relative: 0 %
Eosinophils Absolute: 0 K/uL (ref 0.0–0.5)
Eosinophils Relative: 0 %
HCT: 50.4 % — ABNORMAL HIGH (ref 36.0–46.0)
Hemoglobin: 16.9 g/dL — ABNORMAL HIGH (ref 12.0–15.0)
Immature Granulocytes: 1 %
Lymphocytes Relative: 15 %
Lymphs Abs: 2.9 K/uL (ref 0.7–4.0)
MCH: 25.9 pg — ABNORMAL LOW (ref 26.0–34.0)
MCHC: 33.5 g/dL (ref 30.0–36.0)
MCV: 77.3 fL — ABNORMAL LOW (ref 80.0–100.0)
Monocytes Absolute: 1.2 K/uL — ABNORMAL HIGH (ref 0.1–1.0)
Monocytes Relative: 6 %
Neutro Abs: 15.7 K/uL — ABNORMAL HIGH (ref 1.7–7.7)
Neutrophils Relative %: 78 %
Platelets: 326 K/uL (ref 150–400)
RBC: 6.52 MIL/uL — ABNORMAL HIGH (ref 3.87–5.11)
RDW: 15.8 % — ABNORMAL HIGH (ref 11.5–15.5)
WBC: 19.9 K/uL — ABNORMAL HIGH (ref 4.0–10.5)
nRBC: 0 % (ref 0.0–0.2)

## 2024-07-20 LAB — COMPREHENSIVE METABOLIC PANEL WITH GFR
ALT: 15 U/L (ref 0–44)
AST: 27 U/L (ref 15–41)
Albumin: 4.7 g/dL (ref 3.5–5.0)
Alkaline Phosphatase: 92 U/L (ref 38–126)
Anion gap: 18 — ABNORMAL HIGH (ref 5–15)
BUN: 11 mg/dL (ref 6–20)
CO2: 23 mmol/L (ref 22–32)
Calcium: 10.1 mg/dL (ref 8.9–10.3)
Chloride: 96 mmol/L — ABNORMAL LOW (ref 98–111)
Creatinine, Ser: 0.94 mg/dL (ref 0.44–1.00)
GFR, Estimated: >60 mL/min (ref >60)
Glucose, Bld: 119 mg/dL — ABNORMAL HIGH (ref 70–99)
Potassium: 2.8 mmol/L — ABNORMAL LOW (ref 3.5–5.1)
Sodium: 137 mmol/L (ref 135–145)
Total Bilirubin: 0.8 mg/dL (ref 0.0–1.2)
Total Protein: 8.7 g/dL — ABNORMAL HIGH (ref 6.5–8.1)

## 2024-07-20 LAB — MAGNESIUM: Magnesium: 2.1 mg/dL (ref 1.7–2.4)

## 2024-07-20 LAB — LIPASE, BLOOD: Lipase: 33 U/L (ref 11–51)

## 2024-07-20 MED ORDER — POTASSIUM CHLORIDE 10 MEQ/100ML IV SOLN
10.0000 meq | INTRAVENOUS | Status: AC
  Administered 2024-07-20 (×2): 10 meq via INTRAVENOUS
  Filled 2024-07-20 (×2): qty 100

## 2024-07-20 MED ORDER — HYDROMORPHONE HCL 1 MG/ML IJ SOLN: 0.5000 mg | INTRAMUSCULAR | Status: DC | PRN

## 2024-07-20 MED ORDER — TRIMETHOBENZAMIDE HCL 100 MG/ML IM SOLN: 200.0000 mg | Freq: Three times a day (TID) | INTRAMUSCULAR | Status: DC | PRN

## 2024-07-20 MED ORDER — ORAL CARE MOUTH RINSE: 15.0000 mL | OROMUCOSAL | Status: DC | PRN

## 2024-07-20 MED ORDER — ONDANSETRON HCL 4 MG/2ML IJ SOLN
4.0000 mg | Freq: Once | INTRAMUSCULAR | Status: AC
  Administered 2024-07-20: 4 mg via INTRAVENOUS
  Filled 2024-07-20: qty 2

## 2024-07-20 MED ORDER — LACTATED RINGERS IV SOLN: INTRAVENOUS | Status: DC

## 2024-07-20 MED ORDER — LACTATED RINGERS IV BOLUS
1000.0000 mL | Freq: Once | INTRAVENOUS | Status: AC
  Administered 2024-07-20: 1000 mL via INTRAVENOUS

## 2024-07-20 MED ORDER — ENOXAPARIN SODIUM 40 MG/0.4ML IJ SOSY
40.0000 mg | PREFILLED_SYRINGE | INTRAMUSCULAR | Status: DC
  Administered 2024-07-21 (×2): 40 mg via SUBCUTANEOUS
  Filled 2024-07-20 (×2): qty 0.4

## 2024-07-20 MED ORDER — POTASSIUM CHLORIDE 20 MEQ PO PACK
40.0000 meq | PACK | Freq: Once | ORAL | Status: AC
  Administered 2024-07-21: 40 meq via ORAL
  Filled 2024-07-20: qty 2

## 2024-07-20 MED ORDER — POTASSIUM CHLORIDE CRYS ER 20 MEQ PO TBCR
40.0000 meq | EXTENDED_RELEASE_TABLET | Freq: Once | ORAL | Status: AC
  Administered 2024-07-20: 40 meq via ORAL
  Filled 2024-07-20: qty 2

## 2024-07-20 MED ORDER — HYDROMORPHONE HCL 1 MG/ML IJ SOLN
0.5000 mg | Freq: Once | INTRAMUSCULAR | Status: AC
  Administered 2024-07-20: 0.5 mg via INTRAVENOUS
  Filled 2024-07-20: qty 1

## 2024-07-20 MED ORDER — LACTATED RINGERS IV SOLN: INTRAVENOUS | Status: AC

## 2024-07-20 MED ORDER — DROPERIDOL 2.5 MG/ML IJ SOLN
1.2500 mg | Freq: Once | INTRAMUSCULAR | Status: AC
  Administered 2024-07-20: 1.25 mg via INTRAVENOUS
  Filled 2024-07-20: qty 2

## 2024-07-20 MED ORDER — DIPHENHYDRAMINE HCL 50 MG/ML IJ SOLN
25.0000 mg | Freq: Once | INTRAMUSCULAR | Status: AC
  Administered 2024-07-20: 25 mg via INTRAVENOUS
  Filled 2024-07-20: qty 1

## 2024-07-20 NOTE — ED Notes (Signed)
 Pt given water and peanut butter crackers for PO challenge

## 2024-07-20 NOTE — Plan of Care (Signed)
   Problem: Education: Goal: Knowledge of General Education information will improve Description Including pain rating scale, medication(s)/side effects and non-pharmacologic comfort measures Outcome: Progressing

## 2024-07-20 NOTE — Progress Notes (Signed)
 New Admission Note:  Arrival Method: [facility] Mental Orientation: Alert and oriented x 4 Telemetry: Box 12 Assessment: Completed Skin: Warm and dry IV: NSL Pain: Denies  Tubes: N/A Safety Measures: Safety Fall Prevention Plan initiated.  Admission: Completed 5 M  Orientation: Patient has been orientated to the room, unit and the staff. Welcome booklet given.  Family: N/A  Orders have been reviewed and implemented. Will continue to monitor the patient. Call light has been placed within reach and bed alarm has been activated.   Durwood Dee BSN, RN  Phone Number: [phone]

## 2024-07-20 NOTE — ED Triage Notes (Signed)
 C/o n/v x 1 week. Has been seen here multiple times this week for same. Reports no relief from meds.

## 2024-07-20 NOTE — H&P (Signed)
 History and Physical    AVRIELLE FRY FMW:[id number] DOB: [birth year] DOA: 07/20/2024  Patient coming from: Home.  Chief Complaint: Nausea vomiting.  HPI: Frances Mccormick is a 38 y.o. female with history of chron's disease presently not on any medication, admitted in month of May this year for acute myocarditis being followed by cardiologist has follow-up next month, prior admission for hyperemesis in the setting of marijuana use, depression presents to the ER with complaints of intractable nausea vomiting.  Patient had come to the ER on August 18 at that time was managed symptomatically.  CT scan abdomen pelvis done on July 18, 2024 was unremarkable.  Urine drug screen was positive for marijuana.  Patient states since then she has been having intractable nausea vomiting presents back to the ER.  Does not have abdominal pain but does have some discomfort on vomiting.  Denies any diarrhea.  Denies any sick contacts or any recent travel.  ED Course: In the ER labs show leukocytosis and erythrocytosis.  AST and ALT were within normal limits.  Total bilirubin was 0.8.  Lipase 33.  Potassium is low was given potassium replacement started on IV fluids antiemetics and admitted for further observation.  Review of Systems: As per HPI, rest all negative.   Past Medical History:  Diagnosis Date   Advanced maternal age in multigravida, second trimester 04/25/2022   Allergy    Bee venom   Anxiety    Chronic hypertension with superimposed preeclampsia 08/11/2018   Crohn's colitis (HCC)    Depression    Hidradenitis suppurativa    Hypertension    Hyperthyroidism affecting pregnancy in first trimester 01/12/2022   Repeat TFTs in 2nd trimester     Migraine    a few/year (07/27/2018)   Non-compliant patient    Preterm delivery after induction of labor 06/20/2022   Preterm premature rupture of membranes (PPROM) with onset of labor within 24 hours of rupture in third trimester, antepartum 06/19/2022    Supervision of high risk pregnancy, antepartum 12/16/2021              Nursing Staff    Provider      Office Location     [facility]    Dating     LMP,  9 week US       PNC Model    Galerius.Gant ] Traditional  [ ]  Centering  [ ]  Mom-Baby Dyad                Language     English    Anatomy US      Nl 19 weeks      Flu Vaccine      04/25/2022    Genetic/Carrier Screen     NIPS:   LR female  AFP:   *Screen Negative*  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 She has never used smokeless tobacco. She reports that she does not currently use alcohol after a past usage of about 2.0 standard drinks of alcohol per week. She reports current drug use. Drug: Marijuana.  Allergies  Allergen Reactions   Bee Venom Anaphylaxis   Ciprofloxacin  Anaphylaxis    Family History  Problem Relation Age of Onset   Hypertension Mother    Diabetes Mother    Heart disease Mother    Diabetes Father    Hypertension Maternal Grandmother    Stroke Maternal Grandmother    Hypertension Maternal Grandfather    Diabetes Maternal Grandfather    Breast cancer Maternal Aunt     Prior to Admission medications   Medication Sig Start Date End Date Taking? Authorizing Provider  metoCLOPramide  (REGLAN ) 10 MG tablet Take 1 tablet (10 mg total) by mouth every 6 (six) hours. 07/17/24   Zelaya, Oscar A, PA-C  Norethindrone  Acetate-Ethinyl Estrad-FE (LOESTRIN 24 FE) 1-20 MG-MCG(24) tablet Take 1 tablet by mouth daily. 03/03/24   Jerrell Cleatus Ned, MD  oxyCODONE -acetaminophen  (PERCOCET/ROXICET) 5-325 MG tablet Take 1 tablet by mouth every 6 (six) hours as needed for severe pain (pain score  7-10). 07/18/24   Odell Balls, PA-C  potassium chloride  SA (KLOR-CON  M) 20 MEQ tablet Take 1 tablet (20 mEq total) by mouth 2 (two) times daily. 07/18/24   Odell Balls, PA-C  promethazine  (PHENERGAN ) 25 MG suppository Place 1 suppository (25 mg total) rectally every 6 (six) hours as needed for nausea or vomiting. 07/18/24   Odell Balls, PA-C  sertraline  (ZOLOFT ) 50 MG tablet Take 1 tablet (50 mg total) by mouth daily. 03/03/24   Jerrell Cleatus Ned, MD    Physical Exam: Constitutional: Moderately built and nourished. Vitals:   07/20/24 2100 07/20/24 2115 07/20/24 2207 07/20/24 2209  BP: 105/89 101/62  115/84  Pulse: (!) 110 96  77  Resp: 19 12  17   Temp:  98.7 F (37.1 C)  98.3 F (36.8 C)  TempSrc:  Oral  Oral  SpO2: 100% 97%  100%  Weight:   86.1 kg   Height:   5' 1 (1.549 m)    Eyes: Anicteric no pallor. ENMT: No discharge from the ears eyes nose or mouth. Neck: No mass felt.  No neck rigidity. Respiratory: No rhonchi or crepitations. Cardiovascular: S1-S2 heard. Abdomen: Soft nontender bowel sound present. Musculoskeletal: No edema. Skin: No rash. Neurologic: Alert awake oriented time place and person.  Moves all extremities. Psychiatric: Appears normal.  Normal affect.   Labs on Admission: I have personally reviewed following labs and imaging studies  CBC: Recent Labs  Lab 07/17/24 1124 07/18/24 1748 07/20/24 1928  WBC 19.7* 21.9* 19.9*  NEUTROABS  --  19.0* 15.7*  HGB 16.1* 16.7* 16.9*  HCT 48.1* 49.3* 50.4*  MCV 78.9* 77.5* 77.3*  PLT 327 358 326   Basic Metabolic Panel: Recent Labs  Lab 07/17/24 1124 07/18/24 1748 07/20/24 1928  NA 136 136 137  K 3.4* 3.0* 2.8*  CL 99 97* 96*  CO2 19* 21* 23  GLUCOSE 164* 114* 119*  BUN 5* 13 11  CREATININE 0.71 1.19* 0.94  CALCIUM  9.6 10.1 10.1  MG 1.9  --  2.1   GFR: Estimated Creatinine Clearance: 80.8 mL/min (by C-G formula based on SCr of 0.94 mg/dL). Liver Function Tests: Recent Labs  Lab  07/17/24 1124 07/18/24 1748 07/20/24 1928  AST 32 30 27  ALT 14 13 15   ALKPHOS 111 110 92  BILITOT 0.4 0.6 0.8  PROT 8.6* 9.2* 8.7*  ALBUMIN 4.6 4.9 4.7   Recent Labs  Lab 07/17/24 1124 07/18/24 1748 07/20/24 1928  LIPASE 19 21 33   No results for input(s): AMMONIA in the last 168 hours.  Coagulation Profile: No results for input(s): INR, PROTIME in the last 168 hours. Cardiac Enzymes: No results for input(s): CKTOTAL, CKMB, CKMBINDEX, TROPONINI in the last 168 hours. BNP (last 3 results) No results for input(s): PROBNP in the last 8760 hours. HbA1C: No results for input(s): HGBA1C in the last 72 hours. CBG: No results for input(s): GLUCAP in the last 168 hours. Lipid Profile: No results for input(s): CHOL, HDL, LDLCALC, TRIG, CHOLHDL, LDLDIRECT in the last 72 hours. Thyroid  Function Tests: No results for input(s): TSH, T4TOTAL, FREET4, T3FREE, THYROIDAB in the last 72 hours. Anemia Panel: No results for input(s): VITAMINB12, FOLATE, FERRITIN, TIBC, IRON , RETICCTPCT in the last 72 hours. Urine analysis:    Component Value Date/Time   COLORURINE YELLOW 07/18/2024 2006   APPEARANCEUR HAZY (A) 07/18/2024 2006   LABSPEC 1.019 07/18/2024 2006   PHURINE 6.0 07/18/2024 2006   GLUCOSEU NEGATIVE 07/18/2024 2006   HGBUR MODERATE (A) 07/18/2024 2006   BILIRUBINUR NEGATIVE 07/18/2024 2006   KETONESUR 40 (A) 07/18/2024 2006   PROTEINUR 100 (A) 07/18/2024 2006   UROBILINOGEN 0.2 06/08/2015 0736   NITRITE NEGATIVE 07/18/2024 2006   LEUKOCYTESUR NEGATIVE 07/18/2024 2006   Sepsis Labs: @LABRCNTIP (procalcitonin:4,lacticidven:4) )No results found for this or any previous visit (from the past 240 hours).   Radiological Exams on Admission: No results found.  EKG: Independently reviewed.  Sinus tachycardia.  Assessment/Plan Principal Problem:   Intractable cyclical vomiting with nausea Active Problems:   Crohn's disease  (HCC)   Depression, major, single episode, moderate (HCC)   Hypokalemia   Intractable nausea and vomiting    Intractable nausea vomiting likely secondary to cannabinoid hyperemesis syndrome.  Urine drug screen was positive for marijuana 3 days ago.  Will keep patient on liquid diet try to advance as tolerated antiemetics.  IV fluids. Severe hypokalemia likely from nausea vomiting.  Replace and recheck.  Check magnesium . Leukocytosis likely reactionary.  Patient is afebrile abdomen appears benign.  No signs of infection.  Closely monitor. Erythrocytosis likely from dehydration.  Follow CBC after hydration. History of depression takes Zoloft  restart once able to take orally. 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  Denies any chest pain or shortness of breath. History of Crohn's disease presently not on any medication.  Has appointment with Dr. Rollin gastroenterologist next week for repeat colonoscopy.  Since patient has intractable nausea vomiting unable to keep anything will need close monitoring further workup and more than 2 midnight stay.  Pregnancy screen is pending.   DVT prophylaxis: Lovenox . Code Status: Full code. Family Communication: [place] with patient. Disposition Plan: Monitored bed. Consults called: None. Admission status: Observation.

## 2024-07-20 NOTE — Plan of Care (Addendum)
 [facility] emergency department to [facility] observation status:  37 year old female past medical history of cyclical vomiting syndrome, Crohn's disease, depression, anxiety, essential hypertension, and menorrhagia presented to emergency department with complaining of abdominal pain, nausea, vomiting and sweating that started since last night which initially improved and returned back around 3 PM today.  Patient has previous hospital admission for intractable nausea vomiting for marijuana use. Presentation to ED patient found hypertensive and tachycardic. CBC showed leukocytosis 19.9, stable H&H and normal platelet count. CMP shows low potassium 2.8, otherwise unremarkable normal mag level.  Per chart review patient has multiple ED visit and most recent imaging first 8/17 showing small hiatal hernia, umbilical hernia no acute intra-abdominal finding.  In the ED patient received Benadryl , droperidol , Zofran , oral and IV potassium.  Also IV Dilaudid .  Hospitalist has been consulted for intractable nausea and vomiting with associated abdominal pain and hypokalemia.

## 2024-07-20 NOTE — ED Provider Notes (Signed)
 [place] EMERGENCY DEPARTMENT AT [facility] Provider Note   CSN: [id number] Arrival date & time: 07/20/24  1716     Patient presents with: Emesis   Frances Mccormick is a 38 y.o. female.   38 year old female undergoing workup for Crohn's disease and with history of cyclical vomiting who presents emergency department nausea and vomiting.  Symptoms started on Saturday.  Worsened and came into the emergency department on Sunday was given IV fluids and antiemetics.  Had a CT scan that was reassuring.  Sent home and then came back several days later and was discharged with Phenergan  suppositories and Reglan .  Has been trying these but has had persistent nausea and vomiting.  Went to GI doctor who told her to come into the emergency department today since her symptoms were not improving.  Denies any marijuana use for weeks        Prior to Admission medications   Medication Sig Start Date End Date Taking? Authorizing Provider  metoCLOPramide  (REGLAN ) 10 MG tablet Take 1 tablet (10 mg total) by mouth every 6 (six) hours. 07/17/24   Zelaya, Oscar A, PA-C  Norethindrone  Acetate-Ethinyl Estrad-FE (LOESTRIN 24 FE) 1-20 MG-MCG(24) tablet Take 1 tablet by mouth daily. 03/03/24   Jerrell Cleatus Ned, MD  oxyCODONE -acetaminophen  (PERCOCET/ROXICET) 5-325 MG tablet Take 1 tablet by mouth every 6 (six) hours as needed for severe pain (pain score 7-10). 07/18/24   Odell Balls, PA-C  potassium chloride  SA (KLOR-CON  M) 20 MEQ tablet Take 1 tablet (20 mEq total) by mouth 2 (two) times daily. 07/18/24   Odell Balls, PA-C  promethazine  (PHENERGAN ) 25 MG suppository Place 1 suppository (25 mg total) rectally every 6 (six) hours as needed for nausea or vomiting. 07/18/24   Odell Balls, PA-C  sertraline  (ZOLOFT ) 50 MG tablet Take 1 tablet (50 mg total) by mouth daily. 03/03/24   Jerrell Cleatus Ned, MD    Allergies: Bee venom and Ciprofloxacin     Review of Systems  Updated Vital Signs BP  115/84 (BP Location: Left Arm)   Pulse 77   Temp 98.3 F (36.8 C) (Oral)   Resp 17   Ht 5' 1 (1.549 m)   Wt 86.1 kg   LMP 07/11/2024 (Approximate)   SpO2 100%   BMI 35.87 kg/m   Physical Exam Vitals and nursing note reviewed.  Constitutional:      General: She is not in acute distress.    Appearance: She is well-developed.  Abdominal:     General: Abdomen is flat. There is no distension.     Palpations: Abdomen is soft. There is no mass.     Tenderness: There is abdominal tenderness (Bilateral mid abdomen). There is no guarding.  Skin:    General: Skin is warm and dry.  Neurological:     Mental Status: She is alert and oriented to person, place, and time. Mental status is at baseline.  Psychiatric:        Mood and Affect: Mood normal.     (all labs ordered are listed, but only abnormal results are displayed) Labs Reviewed  CBC WITH DIFFERENTIAL/PLATELET - Abnormal; Notable for the following components:      Result Value   WBC 19.9 (*)    RBC 6.52 (*)    Hemoglobin 16.9 (*)    HCT 50.4 (*)    MCV 77.3 (*)    MCH 25.9 (*)    RDW 15.8 (*)    Neutro Abs 15.7 (*)    Monocytes Absolute 1.2 (*)  Abs Immature Granulocytes 0.10 (*)    All other components within normal limits  COMPREHENSIVE METABOLIC PANEL WITH GFR - Abnormal; Notable for the following components:   Potassium 2.8 (*)    Chloride 96 (*)    Glucose, Bld 119 (*)    Total Protein 8.7 (*)    Anion gap 18 (*)    All other components within normal limits  LIPASE, BLOOD  MAGNESIUM   URINE DRUG SCREEN    EKG: EKG Interpretation Date/Time:  Wednesday July 20 2024 18:28:41 EDT Ventricular Rate:  104 PR Interval:  124 QRS Duration:  74 QT Interval:  352 QTC Calculation: 463 R Axis:   9  Text Interpretation: Sinus tachycardia Ventricular premature complex LAE, consider biatrial enlargement Minimal ST depression, inferior leads Confirmed by Yolande Charleston [phone]) on 07/20/2024 7:17:08  PM  Radiology: No results found.   Procedures   Medications Ordered in the ED  potassium chloride  10 mEq in 100 mL IVPB (10 mEq Intravenous New Bag/Given 07/20/24 2043)  lactated ringers  infusion ( Intravenous New Bag/Given 07/20/24 2210)  trimethobenzamide  (TIGAN ) injection 200 mg (has no administration in time range)  HYDROmorphone  (DILAUDID ) injection 0.5 mg (has no administration in time range)  lactated ringers  bolus 1,000 mL (0 mLs Intravenous Stopped 07/20/24 2015)  droperidol  (INAPSINE ) 2.5 MG/ML injection 1.25 mg (1.25 mg Intravenous Given 07/20/24 1934)  diphenhydrAMINE  (BENADRYL ) injection 25 mg (25 mg Intravenous Given 07/20/24 1934)  HYDROmorphone  (DILAUDID ) injection 0.5 mg (0.5 mg Intravenous Given 07/20/24 1937)  ondansetron  (ZOFRAN ) injection 4 mg (4 mg Intravenous Given 07/20/24 2039)  potassium chloride  SA (KLOR-CON  M) CR tablet 40 mEq (40 mEq Oral Given 07/20/24 2039)    Clinical Course as of 07/20/24 2218  Wed Jul 20, 2024  2039 Dr Lee from hospitalist to admit [RP]    Clinical Course User Index [RP] Yolande Charleston BROCKS, MD                                 Medical Decision Making Amount and/or Complexity of Data Reviewed Labs: ordered.  Risk Prescription drug management. Decision regarding hospitalization.   38 year old female undergoing workup for Crohn's disease and with history of cyclical vomiting who presents emergency department nausea and vomiting.  Initial Ddx:  Cyclical vomiting, cannabinoid hyperemesis, pancreatitis, Crohn's disease, bowel obstruction, ileus, electrolyte abnormality, dehydration, appendicitis, diverticulitis  MDM/Course:  Patient presents emergency department with nausea and vomiting.  Does have some abdominal pain in her sides as well.  Has a history of cyclical vomiting.  Previously smoked marijuana but says she has not smoked in quite some time so feel that hemorrhoid hyperemesis less likely.  On exam does have tenderness to  palpation of the lateral aspects of her abdomen.  Suspect that this is from her constant vomiting.  Did consider appendicitis as well as bowel obstruction and Crohn's disease; however, with her recent CT scan several days ago being normal feels highly unlikely it is likely from cyclical vomiting.  Lab work shows that she is hypokalemic to 2.8.  Magnesium  WNL.  Is having PVCs as well.  Does have a leukocytosis but is improved from prior.  She is also hemoconcentrated with a hemoglobin of 17.  Was given droperidol  and Benadryl  and upon re-evaluation was feeling better but somewhat nauseous.  Is very concerned about going home at this point in time with her low potassium and the fact this is her third time to the ED  so we will admit her for IV hydration, electrolyte repletion, and control of her symptoms.  This patient presents to the ED for concern of complaints listed in HPI, this involves an extensive number of treatment options, and is a complaint that carries with it a high risk of complications and morbidity. Disposition including potential need for admission considered.   Dispo: Admit to Floor  Records reviewed ED Visit Notes The following labs were independently interpreted: Chemistry and show hypokalemia I personally reviewed and interpreted cardiac monitoring: normal sinus rhythm  I personally reviewed and interpreted the pt's EKG: see above for interpretation  I have reviewed the patients home medications and made adjustments as needed Consults: Hospitalist  Portions of this note were generated with Scientist, clinical (histocompatibility and immunogenetics). Dictation errors may occur despite best attempts at proofreading.    CRITICAL CARE Performed by: Lamar JAYSON Shan   Total critical care time: 30 minutes  Critical care time was exclusive of separately billable procedures and treating other patients.  Critical care was necessary to treat or prevent imminent or life-threatening deterioration.  Critical care was  time spent personally by me on the following activities: development of treatment plan with patient and/or surrogate as well as nursing, discussions with consultants, evaluation of patient's response to treatment, examination of patient, obtaining history from patient or surrogate, ordering and performing treatments and interventions, ordering and review of laboratory studies, ordering and review of radiographic studies, pulse oximetry and re-evaluation of patient's condition.   Final diagnoses:  Intractable nausea and vomiting  Hypokalemia  Dehydration    ED Discharge Orders     None          Shan Lamar JAYSON, MD 07/20/24 2218

## 2024-07-21 DIAGNOSIS — Z8679 Personal history of other diseases of the circulatory system: Secondary | ICD-10-CM

## 2024-07-21 DIAGNOSIS — F321 Major depressive disorder, single episode, moderate: Secondary | ICD-10-CM | POA: Diagnosis not present

## 2024-07-21 DIAGNOSIS — E876 Hypokalemia: Secondary | ICD-10-CM | POA: Diagnosis not present

## 2024-07-21 DIAGNOSIS — K5000 Crohn's disease of small intestine without complications: Secondary | ICD-10-CM

## 2024-07-21 DIAGNOSIS — D72829 Elevated white blood cell count, unspecified: Secondary | ICD-10-CM | POA: Insufficient documentation

## 2024-07-21 DIAGNOSIS — R1115 Cyclical vomiting syndrome unrelated to migraine: Secondary | ICD-10-CM | POA: Diagnosis not present

## 2024-07-21 HISTORY — DX: Personal history of other diseases of the circulatory system: Z86.79

## 2024-07-21 LAB — CBC
HCT: 45.4 % (ref 36.0–46.0)
Hemoglobin: 15.2 g/dL — ABNORMAL HIGH (ref 12.0–15.0)
MCH: 26.3 pg (ref 26.0–34.0)
MCHC: 33.5 g/dL (ref 30.0–36.0)
MCV: 78.7 fL — ABNORMAL LOW (ref 80.0–100.0)
Platelets: 287 K/uL (ref 150–400)
RBC: 5.77 MIL/uL — ABNORMAL HIGH (ref 3.87–5.11)
RDW: 14.8 % (ref 11.5–15.5)
WBC: 15.6 K/uL — ABNORMAL HIGH (ref 4.0–10.5)
nRBC: 0 % (ref 0.0–0.2)

## 2024-07-21 LAB — COMPREHENSIVE METABOLIC PANEL WITH GFR
ALT: 16 U/L (ref 0–44)
AST: 22 U/L (ref 15–41)
Albumin: 3.4 g/dL — ABNORMAL LOW (ref 3.5–5.0)
Alkaline Phosphatase: 60 U/L (ref 38–126)
Anion gap: 11 (ref 5–15)
BUN: 8 mg/dL (ref 6–20)
CO2: 25 mmol/L (ref 22–32)
Calcium: 8.6 mg/dL — ABNORMAL LOW (ref 8.9–10.3)
Chloride: 103 mmol/L (ref 98–111)
Creatinine, Ser: 0.84 mg/dL (ref 0.44–1.00)
GFR, Estimated: >60 mL/min (ref >60)
Glucose, Bld: 103 mg/dL — ABNORMAL HIGH (ref 70–99)
Potassium: 3.3 mmol/L — ABNORMAL LOW (ref 3.5–5.1)
Sodium: 139 mmol/L (ref 135–145)
Total Bilirubin: 1 mg/dL (ref 0.0–1.2)
Total Protein: 6.8 g/dL (ref 6.5–8.1)

## 2024-07-21 LAB — CREATININE, SERUM
Creatinine, Ser: 0.86 mg/dL (ref 0.44–1.00)
GFR, Estimated: >60 mL/min (ref >60)

## 2024-07-21 LAB — MAGNESIUM: Magnesium: 1.9 mg/dL (ref 1.7–2.4)

## 2024-07-21 LAB — PREGNANCY, URINE: Preg Test, Ur: NEGATIVE

## 2024-07-21 MED ORDER — POTASSIUM CHLORIDE CRYS ER 20 MEQ PO TBCR
40.0000 meq | EXTENDED_RELEASE_TABLET | Freq: Once | ORAL | Status: AC
  Administered 2024-07-21: 40 meq via ORAL
  Filled 2024-07-21: qty 2

## 2024-07-21 MED ORDER — POTASSIUM CHLORIDE 20 MEQ PO PACK
40.0000 meq | PACK | Freq: Once | ORAL | Status: AC
  Administered 2024-07-21: 40 meq via ORAL
  Filled 2024-07-21: qty 2

## 2024-07-21 NOTE — Progress Notes (Signed)
 [facility]                                                                               Rosealyn Little, is a 38 y.o. female, DOB - 1986-04-04, FMW:[id number] Admit date - 07/20/2024    Outpatient Primary MD for the patient is Jerrell, Cleatus Ned, MD  LOS - 0  days    Brief summary   Frances Mccormick is a 38 y.o. female with history of chron's disease presently not on any medication, admitted in month of May this year for acute myocarditis being followed by cardiologist has follow-up next month, prior admission for hyperemesis in the setting of marijuana use, depression presents to the ER with complaints of intractable nausea vomiting.  Patient had come to the ER on August 18 at that time was managed symptomatically.  CT scan abdomen pelvis done on July 18, 2024 was unremarkable.    Assessment & Plan    Assessment and Plan:   Intractable nausea and vomiting : Improved , advance diet as tolerated.    Severe hypokalemia Replaced.  Repeat in am.   Leukocytosis.  Improving.    Depression Resume home meds in am.    H/o myocarditis.  Recommend outpatient follow up with cardiology next month.    H/o Crohn's disease Recommend outpatient follow up with GI for repeat colonoscopy.      Estimated body mass index is 35.87 kg/m as calculated from the following:   Height as of this encounter: 5' 1 (1.549 m).   Weight as of this encounter: 86.1 kg.  Code Status: full code.  DVT Prophylaxis:  enoxaparin  (LOVENOX ) injection 40 mg Start: 07/20/24 2345   Level of Care: Level of care: Telemetry Medical Family Communication: family at bedside.   Disposition Plan:     Remains inpatient appropriate:  pending clinical improvement.   Procedures:  None.   Consultants:   None.   Antimicrobials:   Anti-infectives (From admission, onward)    None        Medications  Scheduled Meds:  enoxaparin  (LOVENOX ) injection  40 mg Subcutaneous Q24H    Continuous Infusions:  lactated ringers  Stopped (07/21/24 0909)   PRN Meds:.mouth rinse, trimethobenzamide     Subjective:   Frances Mccormick was seen and examined today.  Nausea and vomiting has improved.   Objective:   Vitals:   07/20/24 2209 07/21/24 0420 07/21/24 1009 07/21/24 1716  BP: 115/84 101/70 110/82 116/87  Pulse: 77 65 75 87  Resp: 17 18 19 19   Temp: 98.3 F (36.8 C) 98 F (36.7 C) 98.8 F (37.1 C) 98.2 F (36.8 C)  TempSrc: Oral Oral Oral   SpO2: 100% 98% 100% 99%  Weight:      Height:        Intake/Output Summary (Last 24 hours) at 07/21/2024 1815 Last data filed at 07/21/2024 1626 Gross per 24 hour  Intake 1612.52 ml  Output 0 ml  Net 1612.52 ml   Filed Weights   07/20/24 2207  Weight: 86.1 kg     Exam General: Alert and oriented x 3, NAD Cardiovascular: S1 S2 auscultated, no murmurs, RRR Respiratory: Clear to auscultation bilaterally, no wheezing, rales  or rhonchi Gastrointestinal: Soft, nontender, nondistended, + bowel sounds Ext: no pedal edema bilaterally Neuro: AAOx3, Cr N's II- XII. Strength 5/5 upper and lower extremities bilaterally Skin: No rashes Psych: Normal affect and demeanor, alert and oriented x3    Data Reviewed:  I have personally reviewed following labs and imaging studies   CBC Lab Results  Component Value Date   WBC 15.6 (H) 07/20/2024   RBC 5.77 (H) 07/20/2024   HGB 15.2 (H) 07/20/2024   HCT 45.4 07/20/2024   MCV 78.7 (L) 07/20/2024   MCH 26.3 07/20/2024   PLT 287 07/20/2024   MCHC 33.5 07/20/2024   RDW 14.8 07/20/2024   LYMPHSABS 2.9 07/20/2024   MONOABS 1.2 (H) 07/20/2024   EOSABS 0.0 07/20/2024   BASOSABS 0.1 07/20/2024     Last metabolic panel Lab Results  Component Value Date   NA 139 07/21/2024   K 3.3 (L) 07/21/2024   CL 103 07/21/2024   CO2 25 07/21/2024   BUN 8 07/21/2024   CREATININE 0.84 07/21/2024   GLUCOSE 103 (H) 07/21/2024   GFRNONAA >60 07/21/2024   GFRAA >60 03/24/2020    CALCIUM  8.6 (L) 07/21/2024   PHOS 2.9 06/08/2021   PROT 6.8 07/21/2024   ALBUMIN 3.4 (L) 07/21/2024   LABGLOB 3.1 06/27/2022   AGRATIO 1.2 06/27/2022   BILITOT 1.0 07/21/2024   ALKPHOS 60 07/21/2024   AST 22 07/21/2024   ALT 16 07/21/2024   ANIONGAP 11 07/21/2024    CBG (last 3)  No results for input(s): GLUCAP in the last 72 hours.    Coagulation Profile: No results for input(s): INR, PROTIME in the last 168 hours.   Radiology Studies: No results found.     Elgie Butter M.D. [facility] 07/21/2024, 6:15 PM  Available via Epic secure chat 7am-7pm After 7 pm, please refer to night coverage provider listed on amion.

## 2024-07-21 NOTE — TOC Initial Note (Signed)
 Transition of Care [facility]) - Initial/Assessment Note    Patient Details  Name: Frances Mccormick MRN: [id number] Date of Birth: 05-19-1986  Transition of Care [facility]) CM/SW Contact:    Lendia Dais, LCSWA Phone Number: 07/21/2024, 3:11 PM  Clinical Narrative: Patient is from home with two year old son.  Patient is independent, with no SDOH concerns. Patient is able to afford basic needs and receives assistance through [facility].  Pt does not have a HCPOA but requested information about. CSW will request for a spiritual consult.   No further ICM needs at this time.                   Expected Discharge Plan: Home/Self Care Barriers to Discharge: Continued Medical Work up   Patient Goals and CMS Choice Patient states their goals for this hospitalization and ongoing recovery are:: to get back on her feet and take care of son   Choice offered to / list presented to : NA      Expected Discharge Plan and Services In-house Referral: Clinical Social Work     Living arrangements for the past 2 months: Single Family Home                                      Prior Living Arrangements/Services Living arrangements for the past 2 months: Single Family Home Lives with:: Minor Children (2 y.o.) Patient language and need for interpreter reviewed:: Yes Do you feel safe going back to the place where you live?: Yes      Need for Family Participation in Patient Care: No (Comment) Care giver support system in place?: No (comment)   Criminal Activity/Legal Involvement Pertinent to Current Situation/Hospitalization: No - Comment as needed  Activities of Daily Living   ADL Screening (condition at time of admission) Independently performs ADLs?: Yes (appropriate for developmental age) Is the patient deaf or have difficulty hearing?: No Does the patient have difficulty seeing, even when wearing glasses/contacts?: No Does the patient have difficulty concentrating, remembering, or making  decisions?: No  Permission Sought/Granted Permission sought to share information with : Family Supports Permission granted to share information with : Yes, Verbal Permission Granted  Share Information with NAME: Jon Gobble     Permission granted to share info w Relationship: Cousin  Permission granted to share info w Contact Information: [phone]  Emotional Assessment Appearance:: Appears younger than stated age Attitude/Demeanor/Rapport: Engaged Affect (typically observed): Pleasant, Appropriate Orientation: : Oriented to Self, Oriented to Situation, Oriented to Place, Oriented to  Time Alcohol / Substance Use: Other (comment) Psych Involvement: No (comment)  Admission diagnosis:  Intractable cyclical vomiting with nausea [R11.15] Intractable nausea and vomiting [R11.2] Patient Active Problem List   Diagnosis Date Noted   Leukocytosis 07/21/2024   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 [address] [place] - [address] [place] [address] Phone: [phone] Fax: [phone]  [id number] - [place], [place] - [address] AT [facility] [address] [place] [address] Phone: [phone] Fax: [phone]  [id number] - [place], [place] - [address] AT [facility] [address] [place] [address] Phone: [phone] Fax: [phone]  [place] [facility] [address] [place] [address] Phone: [phone] Fax: [phone]  [id number] [place], [place] - [address] AT [facility] [address] [place] [address] Phone: [phone] Fax: [phone]     Social Drivers of Health (SDOH) Social History: SDOH Screenings   Food Insecurity: No Food Insecurity (04/21/2024)  Housing: Low Risk  (04/21/2024)  Transportation Needs: No Transportation Needs (04/21/2024)  Utilities: Not At Risk (04/21/2024)  Alcohol Screen: Low Risk  (03/03/2024)  Depression (PHQ2-9): Low Risk  (03/31/2024)  Recent Concern: Depression (PHQ2-9) - Medium Risk (03/03/2024)  Financial Resource Strain: Medium Risk (03/03/2024)  Physical Activity: Insufficiently Active (03/03/2024)  Social Connections: Moderately Integrated (03/03/2024)  Stress: Stress Concern Present (03/03/2024)  Tobacco Use: Low Risk  (07/20/2024)   SDOH Interventions:     Readmission Risk Interventions     No data to display

## 2024-07-22 DIAGNOSIS — E876 Hypokalemia: Secondary | ICD-10-CM | POA: Diagnosis not present

## 2024-07-22 DIAGNOSIS — R112 Nausea with vomiting, unspecified: Secondary | ICD-10-CM | POA: Diagnosis not present

## 2024-07-22 DIAGNOSIS — K5000 Crohn's disease of small intestine without complications: Secondary | ICD-10-CM | POA: Diagnosis not present

## 2024-07-22 DIAGNOSIS — R1115 Cyclical vomiting syndrome unrelated to migraine: Secondary | ICD-10-CM | POA: Diagnosis not present

## 2024-07-22 LAB — CBC WITH DIFFERENTIAL/PLATELET
Abs Immature Granulocytes: 0.06 K/uL (ref 0.00–0.07)
Basophils Absolute: 0.1 K/uL (ref 0.0–0.1)
Basophils Relative: 1 %
Eosinophils Absolute: 0.2 K/uL (ref 0.0–0.5)
Eosinophils Relative: 2 %
HCT: 48.3 % — ABNORMAL HIGH (ref 36.0–46.0)
Hemoglobin: 15.5 g/dL — ABNORMAL HIGH (ref 12.0–15.0)
Immature Granulocytes: 1 %
Lymphocytes Relative: 34 %
Lymphs Abs: 3.9 K/uL (ref 0.7–4.0)
MCH: 25.8 pg — ABNORMAL LOW (ref 26.0–34.0)
MCHC: 32.1 g/dL (ref 30.0–36.0)
MCV: 80.4 fL (ref 80.0–100.0)
Monocytes Absolute: 0.8 K/uL (ref 0.1–1.0)
Monocytes Relative: 7 %
Neutro Abs: 6.5 K/uL (ref 1.7–7.7)
Neutrophils Relative %: 55 %
Platelets: 317 K/uL (ref 150–400)
RBC: 6.01 MIL/uL — ABNORMAL HIGH (ref 3.87–5.11)
RDW: 14.7 % (ref 11.5–15.5)
WBC: 11.6 K/uL — ABNORMAL HIGH (ref 4.0–10.5)
nRBC: 0 % (ref 0.0–0.2)

## 2024-07-22 LAB — BASIC METABOLIC PANEL WITH GFR
Anion gap: 9 (ref 5–15)
BUN: 8 mg/dL (ref 6–20)
CO2: 25 mmol/L (ref 22–32)
Calcium: 8.9 mg/dL (ref 8.9–10.3)
Chloride: 102 mmol/L (ref 98–111)
Creatinine, Ser: 0.87 mg/dL (ref 0.44–1.00)
GFR, Estimated: >60 mL/min (ref >60)
Glucose, Bld: 90 mg/dL (ref 70–99)
Potassium: 3.8 mmol/L (ref 3.5–5.1)
Sodium: 136 mmol/L (ref 135–145)

## 2024-07-22 NOTE — TOC Transition Note (Signed)
 Transition of Care [facility]) - Discharge Note   Patient Details  Name: Frances Mccormick MRN: [id number] Date of Birth: 28-Nov-1986  Transition [facility] Care [facility]) CM/SW Contact:  Tom-Johnson, Harvest Muskrat, RN Phone Number: 07/22/2024, 10:29 AM   Clinical Narrative:     Patient is scheduled for discharge today.  Outpatient f/u, hospital f/u and discharge instructions on AVS. No ICM needs or recommendations noted. Arzella Slough to transport at discharge.  No further ICM needs noted.      Final next level of care: Home/Self Care Barriers to Discharge: Barriers Resolved   Patient Goals and CMS Choice Patient states their goals for this hospitalization and ongoing recovery are:: to get back on her feet and take care of son CMS Medicare.gov Compare Post Acute Care list provided to:: Patient Choice offered to / list presented to : NA      Discharge Placement                Patient to be transferred to facility by: Family      Discharge Plan and Services Additional resources added to the After Visit Summary for   In-house Referral: Clinical Social Work              DME Arranged: N/A DME Agency: NA       HH Arranged: NA HH Agency: NA        Social Drivers of Health (SDOH) Interventions SDOH Screenings   Food Insecurity: No Food Insecurity (04/21/2024)  Housing: Low Risk  (04/21/2024)  Transportation Needs: No Transportation Needs (04/21/2024)  Utilities: Not At Risk (04/21/2024)  Alcohol Screen: Low Risk  (03/03/2024)  Depression (PHQ2-9): Low Risk  (03/31/2024)  Recent Concern: Depression (PHQ2-9) - Medium Risk (03/03/2024)  Financial Resource Strain: Medium Risk (03/03/2024)  Physical Activity: Insufficiently Active (03/03/2024)  Social Connections: Moderately Integrated (03/03/2024)  Stress: Stress Concern Present (03/03/2024)  Tobacco Use: Low Risk  (07/20/2024)     Readmission Risk Interventions     No data to display

## 2024-07-22 NOTE — Discharge Summary (Incomplete)
 Physician Discharge Summary   Patient: Frances Mccormick MRN: [id number] DOB: Mar 28, 1986  Admit date:     07/20/2024  Discharge date: {dischdate:26783}  Discharge Physician: Elgie Butter   PCP: Frances Cleatus Ned, MD   Recommendations at discharge:  {Tip this will not be part of the note when signed- Example include specific recommendations for outpatient follow-up, pending tests to follow-up on. (Optional):26781}  ***  Discharge Diagnoses: Principal Problem:   Intractable cyclical vomiting with nausea Active Problems:   Erythrocytosis   Crohn's disease (HCC)   Depression, major, single episode, moderate (HCC)   Hypokalemia   Intractable nausea and vomiting   Leukocytosis   History of myocarditis  Resolved Problems:   * No resolved hospital problems. [facility] Course: No notes on file  Assessment and Plan: No notes have been filed under this hospital service. Service: Hospitalist     {Tip this will not be part of the note when signed Body mass index is 35.87 kg/m. , ,  (Optional):26781}  {(NOTE) Pain control PDMP Statment (Optional):26782} Consultants: *** Procedures performed: ***  Disposition: {Plan; Disposition:26390} Diet recommendation:  Discharge Diet Orders (From admission, onward)     Start     Ordered   07/22/24 0000  Diet - low sodium heart healthy        07/22/24 0945           {Diet_Plan:26776} DISCHARGE MEDICATION: Allergies as of 07/22/2024       Reactions   Bee Venom Anaphylaxis   Ciprofloxacin  Anaphylaxis        Medication List     STOP taking these medications    ibuprofen  200 MG tablet Commonly known as: ADVIL    Na Sulfate-K Sulfate-Mg Sulfate concentrate 17.5-3.13-1.6 GM/177ML Soln Commonly known as: SUPREP   Norethindrone  Acetate-Ethinyl Estrad-FE 1-20 MG-MCG(24) tablet Commonly known as: LOESTRIN 24 FE       TAKE these medications    metoCLOPramide  10 MG tablet Commonly known as: REGLAN  Take 1 tablet (10  mg total) by mouth every 6 (six) hours.   oxyCODONE -acetaminophen  5-325 MG tablet Commonly known as: PERCOCET/ROXICET Take 1 tablet by mouth every 6 (six) hours as needed for severe pain (pain score 7-10). What changed: how much to take   potassium chloride  SA 20 MEQ tablet Commonly known as: KLOR-CON  M Take 1 tablet (20 mEq total) by mouth 2 (two) times daily.   promethazine  25 MG suppository Commonly known as: PHENERGAN  Place 1 suppository (25 mg total) rectally every 6 (six) hours as needed for nausea or vomiting.   sertraline  50 MG tablet Commonly known as: ZOLOFT  Take 1 tablet (50 mg total) by mouth daily.        Follow-up Information     Frances Cleatus Ned, MD. Schedule an appointment as soon as possible for a visit in 1 week(s).   Specialty: Internal Medicine Contact information: [address] [place] [address] [phone]         Frances Jerilynn LABOR, MD .   Specialty: Obstetrics and Gynecology Contact information: [address] [place] [address] [phone]                Discharge Exam: Frances Mccormick   07/20/24 2207  Weight: 86.1 kg   ***  Condition at discharge: {DC Condition:26389}  The results of significant diagnostics from this hospitalization (including imaging, microbiology, ancillary and laboratory) are listed below for reference.   Imaging Studies: CT ABDOMEN PELVIS W CONTRAST Result Date: 07/17/2024 CLINICAL DATA:  LLQ  abdominal pain RLQ abdominal pain Hx crohn's disease EXAM: CT ABDOMEN AND PELVIS WITH CONTRAST TECHNIQUE: Multidetector CT imaging of the abdomen and pelvis was performed using the standard protocol following bolus administration of intravenous contrast. RADIATION DOSE REDUCTION: This exam was performed according to the departmental dose-optimization program which includes automated exposure control, adjustment of the mA and/or kV according to patient size and/or use of iterative reconstruction technique.  CONTRAST:  OMNIPAQUE  IOHEXOL  300 MG/ML  SOLN COMPARISON:  CT abdomen pelvis 02/07/2024 FINDINGS: Lower chest: Small hiatal hernia.  No acute abnormality. Hepatobiliary: No focal liver abnormality. No gallstones, gallbladder wall thickening, or pericholecystic fluid. No biliary dilatation. Pancreas: No focal lesion. Normal pancreatic contour. No surrounding inflammatory changes. No main pancreatic ductal dilatation. Spleen: Normal in size without focal abnormality. Adrenals/Urinary Tract: No adrenal nodule bilaterally. Bilateral kidneys enhance symmetrically. No hydronephrosis. No hydroureter. The urinary bladder is unremarkable. Stomach/Bowel: Stomach is within normal limits. No evidence of bowel wall thickening or dilatation. Appendix appears normal. Vascular/Lymphatic: No abdominal aorta or iliac aneurysm. Mild atherosclerotic plaque of the aorta and its branches. No abdominal, pelvic, or inguinal lymphadenopathy. Reproductive: Suggestion of the right ovary along the posterior in wall of the uterus (2:61) versus intramural uterine fibroid. Uterus and bilateral adnexal regions are unremarkable. Other: No intraperitoneal free fluid. No intraperitoneal free gas. No organized fluid collection. Musculoskeletal: Tiny fat containing umbilical hernia. No suspicious lytic or blastic osseous lesions. No acute displaced fracture. IMPRESSION: 1. Small hiatal hernia. 2. Tiny fat containing umbilical hernia. 3. No acute intra-abdominal or intrapelvic abnormality. Electronically Signed   By: Morgane  Naveau M.D.   On: 07/17/2024 14:35    Microbiology: Results for orders placed or performed during the hospital encounter of 04/20/24  Resp panel by RT-PCR (RSV, Flu A&B, Covid) Urine, Clean Catch     Status: None   Collection Time: 04/20/24 11:23 AM   Specimen: Urine, Clean Catch; Nasal Swab  Result Value Ref Range Status   SARS Coronavirus 2 by RT PCR NEGATIVE NEGATIVE Final    Comment: (NOTE) SARS-CoV-2 target  nucleic acids are NOT DETECTED.  The SARS-CoV-2 RNA is generally detectable in upper respiratory specimens during the acute phase of infection. The lowest concentration of SARS-CoV-2 viral copies this assay can detect is 138 copies/mL. A negative result does not preclude SARS-Cov-2 infection and should not be used as the sole basis for treatment or other patient management decisions. A negative result may occur with  improper specimen collection/handling, submission of specimen other than nasopharyngeal swab, presence of viral mutation(s) within the areas targeted by this assay, and inadequate number of viral copies(<138 copies/mL). A negative result must be combined with clinical observations, patient history, and epidemiological information. The expected result is Negative.  Fact Sheet for Patients:  BloggerCourse.com  Fact Sheet for Healthcare Providers:  SeriousBroker.it  This test is no t yet approved or cleared by the [place]  FDA and  has been authorized for detection and/or diagnosis of SARS-CoV-2 by FDA under an Emergency Use Authorization (EUA). This EUA will remain  in effect (meaning this test can be used) for the duration of the COVID-19 declaration under Section 564(b)(1) of the Act, 21 U.S.C.section 360bbb-3(b)(1), unless the authorization is terminated  or revoked sooner.       Influenza A by PCR NEGATIVE NEGATIVE Final   Influenza B by PCR NEGATIVE NEGATIVE Final    Comment: (NOTE) The Xpert Xpress SARS-CoV-2/FLU/RSV plus assay is intended as an aid in the diagnosis of influenza from  Nasopharyngeal swab specimens and should not be used as a sole basis for treatment. Nasal washings and aspirates are unacceptable for Xpert Xpress SARS-CoV-2/FLU/RSV testing.  Fact Sheet for Patients: BloggerCourse.com  Fact Sheet for Healthcare  Providers: SeriousBroker.it  This test is not yet approved or cleared by the [place]  FDA and has been authorized for detection and/or diagnosis of SARS-CoV-2 by FDA under an Emergency Use Authorization (EUA). This EUA will remain in effect (meaning this test can be used) for the duration of the COVID-19 declaration under Section 564(b)(1) of the Act, 21 U.S.C. section 360bbb-3(b)(1), unless the authorization is terminated or revoked.     Resp Syncytial Virus by PCR NEGATIVE NEGATIVE Final    Comment: (NOTE) Fact Sheet for Patients: BloggerCourse.com  Fact Sheet for Healthcare Providers: SeriousBroker.it  This test is not yet approved or cleared by the [place]  FDA and has been authorized for detection and/or diagnosis of SARS-CoV-2 by FDA under an Emergency Use Authorization (EUA). This EUA will remain in effect (meaning this test can be used) for the duration of the COVID-19 declaration under Section 564(b)(1) of the Act, 21 U.S.C. section 360bbb-3(b)(1), unless the authorization is terminated or revoked.  Performed at [facility], [address], [place], [place] [address]   Respiratory (~20 pathogens) panel by PCR     Status: None   Collection Time: 04/20/24 11:23 AM   Specimen: Nasopharyngeal Swab; Respiratory  Result Value Ref Range Status   Adenovirus NOT DETECTED NOT DETECTED Final   Coronavirus 229E NOT DETECTED NOT DETECTED Final    Comment: (NOTE) The Coronavirus on the Respiratory Panel, DOES NOT test for the novel  Coronavirus (2019 nCoV)    Coronavirus HKU1 NOT DETECTED NOT DETECTED Final   Coronavirus NL63 NOT DETECTED NOT DETECTED Final   Coronavirus OC43 NOT DETECTED NOT DETECTED Final   Metapneumovirus NOT DETECTED NOT DETECTED Final   Rhinovirus / Enterovirus NOT DETECTED NOT DETECTED Final   Influenza A NOT DETECTED NOT DETECTED Final   Influenza B  NOT DETECTED NOT DETECTED Final   Parainfluenza Virus 1 NOT DETECTED NOT DETECTED Final   Parainfluenza Virus 2 NOT DETECTED NOT DETECTED Final   Parainfluenza Virus 3 NOT DETECTED NOT DETECTED Final   Parainfluenza Virus 4 NOT DETECTED NOT DETECTED Final   Respiratory Syncytial Virus NOT DETECTED NOT DETECTED Final   Bordetella pertussis NOT DETECTED NOT DETECTED Final   Bordetella Parapertussis NOT DETECTED NOT DETECTED Final   Chlamydophila pneumoniae NOT DETECTED NOT DETECTED Final   Mycoplasma pneumoniae NOT DETECTED NOT DETECTED Final    Comment: Performed at [facility], [address] [place], [place] [address]    Labs: CBC: Recent Labs  Lab 07/17/24 1124 07/18/24 1748 07/20/24 1928 07/20/24 2359 07/22/24 1019  WBC 19.7* 21.9* 19.9* 15.6* 11.6*  NEUTROABS  --  19.0* 15.7*  --  6.5  HGB 16.1* 16.7* 16.9* 15.2* 15.5*  HCT 48.1* 49.3* 50.4* 45.4 48.3*  MCV 78.9* 77.5* 77.3* 78.7* 80.4  PLT 327 358 326 287 317   Basic Metabolic Panel: Recent Labs  Lab 07/17/24 1124 07/18/24 1748 07/20/24 1928 07/20/24 2359 07/21/24 0000 07/22/24 1019  NA 136 136 137  --  139 136  K 3.4* 3.0* 2.8*  --  3.3* 3.8  CL 99 97* 96*  --  103 102  CO2 19* 21* 23  --  25 25  GLUCOSE 164* 114* 119*  --  103* 90  BUN 5* 13 11  --  8 8  CREATININE 0.71 1.19* 0.94 0.86 0.84 0.87  CALCIUM  9.6 10.1 10.1  --  8.6* 8.9  MG 1.9  --  2.1  --  1.9  --    Liver Function Tests: Recent Labs  Lab 07/17/24 1124 07/18/24 1748 07/20/24 1928 07/21/24 0000  AST 32 30 27 22   ALT 14 13 15 16   ALKPHOS 111 110 92 60  BILITOT 0.4 0.6 0.8 1.0  PROT 8.6* 9.2* 8.7* 6.8  ALBUMIN 4.6 4.9 4.7 3.4*   CBG: No results for input(s): GLUCAP in the last 168 hours.  Discharge time spent: {LESS THAN/GREATER UYJW:73611} 30 minutes.  Signed: Elgie Butter, MD [facility] 07/22/2024

## 2024-07-22 NOTE — Progress Notes (Signed)
 DISCHARGE NOTE HOME Frances Mccormick to be discharged Home per MD order. Discussed prescriptions and follow up appointments with the patient. Prescriptions given to patient; medication list explained in detail. Patient verbalized understanding.  Skin clean, dry and intact without evidence of skin break down, no evidence of skin tears noted. IV catheter discontinued intact. Site without signs and symptoms of complications. Dressing and pressure applied. Pt denies pain at the site currently. No complaints noted.  Patient free of lines, drains, and wounds.   An After Visit Summary (AVS) was printed and given to the patient. Patient escorted via wheelchair, and discharged home via private auto.  Doyal Sias, RN

## 2024-07-22 NOTE — Plan of Care (Signed)

## 2024-07-25 NOTE — Discharge Summary (Signed)
 Physician Discharge Summary   Patient: Frances Mccormick MRN: [id number] DOB: 26-Sep-1986  Admit date:     07/20/2024  Discharge date: 07/22/2024  Discharge Physician: Elgie Butter   PCP: Jerrell Cleatus Ned, MD   Recommendations at discharge:  Please follow up with PCP in one week.  Please follow up with gastroenterology as scheduled.   Discharge Diagnoses: Principal Problem:   Intractable cyclical vomiting with nausea Active Problems:   Erythrocytosis   Crohn's disease (HCC)   Depression, major, single episode, moderate (HCC)   Hypokalemia   Intractable nausea and vomiting   Leukocytosis   History of myocarditis  Resolved Problems:   * No resolved hospital problems. *  Hospital Course:   Frances Mccormick is a 38 y.o. female with history of chron's disease presently not on any medication, admitted in month of May this year for acute myocarditis being followed by cardiologist has follow-up next month, prior admission for hyperemesis in the setting of marijuana use, depression presents to the ER with complaints of intractable nausea vomiting.  Patient had come to the ER on August 18 at that time was managed symptomatically.  CT scan abdomen pelvis done on July 18, 2024 was unremarkable.   Assessment and Plan:   Intractable nausea and vomiting : Improved , advanced diet as tolerated. Able to tolerated without any issues.      Severe hypokalemia Replaced.     Leukocytosis.  Reactive, resolved.      Depression Resume home meds in am.      H/o myocarditis.  Recommend outpatient follow up with cardiology next month.      H/o Crohn's disease Recommend outpatient follow up with GI for repeat colonoscopy.     Consultants: none.  Procedures performed: none.   Disposition: Home Diet recommendation:  Discharge Diet Orders (From admission, onward)     Start     Ordered   07/22/24 0000  Diet - low sodium heart healthy        07/22/24 0945            Regular diet DISCHARGE MEDICATION: Allergies as of 07/22/2024       Reactions   Bee Venom Anaphylaxis   Ciprofloxacin  Anaphylaxis        Medication List     STOP taking these medications    ibuprofen  200 MG tablet Commonly known as: ADVIL    Na Sulfate-K Sulfate-Mg Sulfate concentrate 17.5-3.13-1.6 GM/177ML Soln Commonly known as: SUPREP   Norethindrone  Acetate-Ethinyl Estrad-FE 1-20 MG-MCG(24) tablet Commonly known as: LOESTRIN 24 FE       TAKE these medications    metoCLOPramide  10 MG tablet Commonly known as: REGLAN  Take 1 tablet (10 mg total) by mouth every 6 (six) hours.   oxyCODONE -acetaminophen  5-325 MG tablet Commonly known as: PERCOCET/ROXICET Take 1 tablet by mouth every 6 (six) hours as needed for severe pain (pain score 7-10). What changed: how much to take   potassium chloride  SA 20 MEQ tablet Commonly known as: KLOR-CON  M Take 1 tablet (20 mEq total) by mouth 2 (two) times daily.   promethazine  25 MG suppository Commonly known as: PHENERGAN  Place 1 suppository (25 mg total) rectally every 6 (six) hours as needed for nausea or vomiting.   sertraline  50 MG tablet Commonly known as: ZOLOFT  Take 1 tablet (50 mg total) by mouth daily.        Follow-up Information     Jerrell Cleatus Ned, MD. Schedule an appointment as soon as possible for a visit in  1 week(s).   Specialty: Internal Medicine Contact information: [address] [place] [address] [phone]         Zina Jerilynn LABOR, MD .   Specialty: Obstetrics and Gynecology Contact information: [address] [place] [address] [phone]                Discharge Exam: Frances Mccormick   07/20/24 2207  Weight: 86.1 kg   General exam: Appears calm and comfortable  Respiratory system: Clear to auscultation. Respiratory effort normal. Cardiovascular system: S1 & S2 heard, RRR. No JVD, murmurs, rubs, gallops or clicks. No pedal edema. Gastrointestinal system:  Abdomen is nondistended, soft and nontender. No organomegaly or masses felt. Normal bowel sounds heard. Central nervous system: Alert and oriented. No focal neurological deficits. Extremities: Symmetric 5 x 5 power. Skin: No rashes, lesions or ulcers Psychiatry: Judgement and insight appear normal. Mood & affect appropriate.    Condition at discharge: fair  The results of significant diagnostics from this hospitalization (including imaging, microbiology, ancillary and laboratory) are listed below for reference.   Imaging Studies: CT ABDOMEN PELVIS W CONTRAST Result Date: 07/17/2024 CLINICAL DATA:  LLQ abdominal pain RLQ abdominal pain Hx crohn's disease EXAM: CT ABDOMEN AND PELVIS WITH CONTRAST TECHNIQUE: Multidetector CT imaging of the abdomen and pelvis was performed using the standard protocol following bolus administration of intravenous contrast. RADIATION DOSE REDUCTION: This exam was performed according to the departmental dose-optimization program which includes automated exposure control, adjustment of the mA and/or kV according to patient size and/or use of iterative reconstruction technique. CONTRAST:  OMNIPAQUE  IOHEXOL  300 MG/ML  SOLN COMPARISON:  CT abdomen pelvis 02/07/2024 FINDINGS: Lower chest: Small hiatal hernia.  No acute abnormality. Hepatobiliary: No focal liver abnormality. No gallstones, gallbladder wall thickening, or pericholecystic fluid. No biliary dilatation. Pancreas: No focal lesion. Normal pancreatic contour. No surrounding inflammatory changes. No main pancreatic ductal dilatation. Spleen: Normal in size without focal abnormality. Adrenals/Urinary Tract: No adrenal nodule bilaterally. Bilateral kidneys enhance symmetrically. No hydronephrosis. No hydroureter. The urinary bladder is unremarkable. Stomach/Bowel: Stomach is within normal limits. No evidence of bowel wall thickening or dilatation. Appendix appears normal. Vascular/Lymphatic: No abdominal aorta or iliac  aneurysm. Mild atherosclerotic plaque of the aorta and its branches. No abdominal, pelvic, or inguinal lymphadenopathy. Reproductive: Suggestion of the right ovary along the posterior in wall of the uterus (2:61) versus intramural uterine fibroid. Uterus and bilateral adnexal regions are unremarkable. Other: No intraperitoneal free fluid. No intraperitoneal free gas. No organized fluid collection. Musculoskeletal: Tiny fat containing umbilical hernia. No suspicious lytic or blastic osseous lesions. No acute displaced fracture. IMPRESSION: 1. Small hiatal hernia. 2. Tiny fat containing umbilical hernia. 3. No acute intra-abdominal or intrapelvic abnormality. Electronically Signed   By: Morgane  Naveau M.D.   On: 07/17/2024 14:35    Microbiology: Results for orders placed or performed during the hospital encounter of 04/20/24  Resp panel by RT-PCR (RSV, Flu A&B, Covid) Urine, Clean Catch     Status: None   Collection Time: 04/20/24 11:23 AM   Specimen: Urine, Clean Catch; Nasal Swab  Result Value Ref Range Status   SARS Coronavirus 2 by RT PCR NEGATIVE NEGATIVE Final    Comment: (NOTE) SARS-CoV-2 target nucleic acids are NOT DETECTED.  The SARS-CoV-2 RNA is generally detectable in upper respiratory specimens during the acute phase of infection. The lowest concentration of SARS-CoV-2 viral copies this assay can detect is 138 copies/mL. A negative result does not preclude SARS-Cov-2 infection and should not  be used as the sole basis for treatment or other patient management decisions. A negative result may occur with  improper specimen collection/handling, submission of specimen other than nasopharyngeal swab, presence of viral mutation(s) within the areas targeted by this assay, and inadequate number of viral copies(<138 copies/mL). A negative result must be combined with clinical observations, patient history, and epidemiological information. The expected result is Negative.  Fact Sheet for  Patients:  BloggerCourse.com  Fact Sheet for Healthcare Providers:  SeriousBroker.it  This test is no t yet approved or cleared by the [place]  FDA and  has been authorized for detection and/or diagnosis of SARS-CoV-2 by FDA under an Emergency Use Authorization (EUA). This EUA will remain  in effect (meaning this test can be used) for the duration of the COVID-19 declaration under Section 564(b)(1) of the Act, 21 U.S.C.section 360bbb-3(b)(1), unless the authorization is terminated  or revoked sooner.       Influenza A by PCR NEGATIVE NEGATIVE Final   Influenza B by PCR NEGATIVE NEGATIVE Final    Comment: (NOTE) The Xpert Xpress SARS-CoV-2/FLU/RSV plus assay is intended as an aid in the diagnosis of influenza from Nasopharyngeal swab specimens and should not be used as a sole basis for treatment. Nasal washings and aspirates are unacceptable for Xpert Xpress SARS-CoV-2/FLU/RSV testing.  Fact Sheet for Patients: BloggerCourse.com  Fact Sheet for Healthcare Providers: SeriousBroker.it  This test is not yet approved or cleared by the [place]  FDA and has been authorized for detection and/or diagnosis of SARS-CoV-2 by FDA under an Emergency Use Authorization (EUA). This EUA will remain in effect (meaning this test can be used) for the duration of the COVID-19 declaration under Section 564(b)(1) of the Act, 21 U.S.C. section 360bbb-3(b)(1), unless the authorization is terminated or revoked.     Resp Syncytial Virus by PCR NEGATIVE NEGATIVE Final    Comment: (NOTE) Fact Sheet for Patients: BloggerCourse.com  Fact Sheet for Healthcare Providers: SeriousBroker.it  This test is not yet approved or cleared by the [place]  FDA and has been authorized for detection and/or diagnosis of SARS-CoV-2 by FDA under an Emergency  Use Authorization (EUA). This EUA will remain in effect (meaning this test can be used) for the duration of the COVID-19 declaration under Section 564(b)(1) of the Act, 21 U.S.C. section 360bbb-3(b)(1), unless the authorization is terminated or revoked.  Performed at [facility], [address], [place], [place] [address]   Respiratory (~20 pathogens) panel by PCR     Status: None   Collection Time: 04/20/24 11:23 AM   Specimen: Nasopharyngeal Swab; Respiratory  Result Value Ref Range Status   Adenovirus NOT DETECTED NOT DETECTED Final   Coronavirus 229E NOT DETECTED NOT DETECTED Final    Comment: (NOTE) The Coronavirus on the Respiratory Panel, DOES NOT test for the novel  Coronavirus (2019 nCoV)    Coronavirus HKU1 NOT DETECTED NOT DETECTED Final   Coronavirus NL63 NOT DETECTED NOT DETECTED Final   Coronavirus OC43 NOT DETECTED NOT DETECTED Final   Metapneumovirus NOT DETECTED NOT DETECTED Final   Rhinovirus / Enterovirus NOT DETECTED NOT DETECTED Final   Influenza A NOT DETECTED NOT DETECTED Final   Influenza B NOT DETECTED NOT DETECTED Final   Parainfluenza Virus 1 NOT DETECTED NOT DETECTED Final   Parainfluenza Virus 2 NOT DETECTED NOT DETECTED Final   Parainfluenza Virus 3 NOT DETECTED NOT DETECTED Final   Parainfluenza Virus 4 NOT DETECTED NOT DETECTED Final   Respiratory Syncytial Virus NOT DETECTED NOT  DETECTED Final   Bordetella pertussis NOT DETECTED NOT DETECTED Final   Bordetella Parapertussis NOT DETECTED NOT DETECTED Final   Chlamydophila pneumoniae NOT DETECTED NOT DETECTED Final   Mycoplasma pneumoniae NOT DETECTED NOT DETECTED Final    Comment: Performed at [facility], [address] [place], [place] [address]    Labs: CBC: Recent Labs  Lab 07/20/24 1928 07/20/24 2359 07/22/24 1019  WBC 19.9* 15.6* 11.6*  NEUTROABS 15.7*  --  6.5  HGB 16.9* 15.2* 15.5*  HCT 50.4* 45.4 48.3*  MCV 77.3* 78.7* 80.4  PLT 326 287 317    Basic Metabolic Panel: Recent Labs  Lab 07/20/24 1928 07/20/24 2359 07/21/24 0000 07/22/24 1019  NA 137  --  139 136  K 2.8*  --  3.3* 3.8  CL 96*  --  103 102  CO2 23  --  25 25  GLUCOSE 119*  --  103* 90  BUN 11  --  8 8  CREATININE 0.94 0.86 0.84 0.87  CALCIUM  10.1  --  8.6* 8.9  MG 2.1  --  1.9  --    Liver Function Tests: Recent Labs  Lab 07/20/24 1928 07/21/24 0000  AST 27 22  ALT 15 16  ALKPHOS 92 60  BILITOT 0.8 1.0  PROT 8.7* 6.8  ALBUMIN 4.7 3.4*   CBG: No results for input(s): GLUCAP in the last 168 hours.  Discharge time spent: 37 minuets.   Signed: Malva Diesing, MD [facility]

## 2024-07-26 ENCOUNTER — Encounter: Payer: Self-pay | Admitting: Student in an Organized Health Care Education/Training Program

## 2024-07-26 DIAGNOSIS — R1084 Generalized abdominal pain: Secondary | ICD-10-CM | POA: Diagnosis not present

## 2024-07-26 DIAGNOSIS — R112 Nausea with vomiting, unspecified: Secondary | ICD-10-CM | POA: Diagnosis not present

## 2024-07-26 DIAGNOSIS — K529 Noninfective gastroenteritis and colitis, unspecified: Secondary | ICD-10-CM | POA: Diagnosis not present

## 2024-08-03 ENCOUNTER — Encounter: Payer: Self-pay | Admitting: Podiatry

## 2024-08-03 ENCOUNTER — Ambulatory Visit (INDEPENDENT_AMBULATORY_CARE_PROVIDER_SITE_OTHER): Admitting: Podiatry

## 2024-08-03 DIAGNOSIS — D492 Neoplasm of unspecified behavior of bone, soft tissue, and skin: Secondary | ICD-10-CM | POA: Diagnosis not present

## 2024-08-03 NOTE — Progress Notes (Signed)
  Subjective:  Patient ID: Frances Mccormick, female    DOB: 05-25-1986,   MRN: [id number]  Chief Complaint  Patient presents with   Callouses    I'm here mainly about these things on the bottom of my feet. ( Calluses 3/4 met right; 2nd met left)   Nail Problem    My big toenails have started to grow in and they hurt.  This toenail has gotten thick so I can't cut it. (3rd right)    38 y.o. female presents for concern as above. Relates lesion on feet have been present for years. Has tried to keep them down and even done compound W but still a problem    Denies any other pedal complaints. Denies n/v/f/c.   Past Medical History:  Diagnosis Date   Advanced maternal age in multigravida, second trimester 04/25/2022   Allergy    Bee venom   Anxiety    Chronic hypertension with superimposed preeclampsia 08/11/2018   Crohn's colitis (HCC)    Depression    Hidradenitis suppurativa    Hypertension    Hyperthyroidism affecting pregnancy in first trimester 01/12/2022   Repeat TFTs in 2nd trimester     Migraine    a few/year (07/27/2018)   Non-compliant patient    Preterm delivery after induction of labor 06/20/2022   Preterm premature rupture of membranes (PPROM) with onset of labor within 24 hours of rupture in third trimester, antepartum 06/19/2022   Supervision of high risk pregnancy, antepartum 12/16/2021              Nursing Staff    Provider      Office Location     [facility]    Dating     LMP,  9 week US       [facility] Model    [employer]ant ] Traditional  [ ]  Centering  [ ]  Mom-Baby Dyad                Language     English    Anatomy US      Nl 19 weeks      Flu Vaccine      04/25/2022    Genetic/Carrier Screen     NIPS:   LR female  AFP:   *Screen Negative*  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 CFT <3 seconds. Normal hair growth on digits. No edema.  Skin. No lacerations or abrasions bilateral feet. Lesions noted  sub third metatarsal head on right and second metatarsal head on left. Discurption of skin lines noted and deep core. Nails 1-5 bilateral thickened and dystrophic.  Musculoskeletal: MMT 5/5 bilateral lower extremities in DF, PF, Inversion and Eversion. Deceased ROM in DF of ankle joint.  Neurological: Sensation intact to light touch.   Assessment:   1. Skin neoplasm      Plan:  Patient was evaluated and treated and all questions answered. -Discussed benign skin lesions with patient and treatment options.  -Hyperkeratotic tissue was debrided with chisel without incident.  -Applied salycylic acid treatment to area with dressing. Advised to remove bandaging tomorrow.  -Nails 1-5 bilateral debrided as courtesy with nail nippers for patient.  -Encouraged daily moisturizing -Discussed use of pumice stone -Advised good supportive shoes and inserts -Patient to return to office as needed or sooner if condition worsens.   Asberry Failing, DPM

## 2024-08-05 ENCOUNTER — Ambulatory Visit (HOSPITAL_COMMUNITY)
Admission: RE | Admit: 2024-08-05 | Discharge: 2024-08-05 | Disposition: A | Discharge status: Home / Self Care | Source: Ambulatory Visit | Attending: Cardiology | Admitting: Cardiology

## 2024-08-05 DIAGNOSIS — B3322 Viral myocarditis: Secondary | ICD-10-CM | POA: Insufficient documentation

## 2024-08-05 LAB — ECHOCARDIOGRAM COMPLETE
Area-P 1/2: 3.36 cm2
S' Lateral: 2.3 cm

## 2024-08-07 NOTE — Progress Notes (Unsigned)
 Cardiology Office Note:    Date:  08/09/2024   ID:  EMMERSEN GARRAWAY, DOB 12-23-1985, MRN [id number]  PCP:  Jerrell Cleatus Ned, MD  Cardiologist:  Lonni LITTIE Nanas, MD  Electrophysiologist:  None   Referring MD: Jerrell Cleatus Ned DEWAINE   Chief Complaint  Patient presents with   myocarditis    History of Present Illness:    Frances Mccormick is a 38 y.o. female with a hx of myocarditis, Crohn's disease, hypertension who presents for follow-up.  She was admitted 03/2024 with nausea/vomiting, found to have elevated troponin to 2000.  Echocardiogram showed EF 50 to 55% but with regional wall motion abnormalities.  LHC showed normal coronary arteries.  Cardiac MRI showed acute myocarditis with regional elevations in myocardial T1/2 as well as nonischemic LGE pattern, LVEF 45%, RVEF 53%.  Echocardiogram 08/05/2024 showed EF 55 to 60%, normal RV function, no significant valvular disease.  Since last clinic visit, she reports she is doing well. Denies any chest pain, dyspnea, lightheadedness, syncope,  or palpitations.  Walking 2-3 times per week for 20 minurtes.  Did report some lower extremity edema but has resolved.  Past Medical History:  Diagnosis Date   Advanced maternal age in multigravida, second trimester 04/25/2022   Allergy    Bee venom   Anxiety    Chronic hypertension with superimposed preeclampsia 08/11/2018   Crohn's colitis (HCC)    Depression    Hidradenitis suppurativa    Hypertension    Hyperthyroidism affecting pregnancy in first trimester 01/12/2022   Repeat TFTs in 2nd trimester     Migraine    a few/year (07/27/2018)   Non-compliant patient    Preterm delivery after induction of labor 06/20/2022   Preterm premature rupture of membranes (PPROM) with onset of labor within 24 hours of rupture in third trimester, antepartum 06/19/2022   Supervision of high risk pregnancy, antepartum 12/16/2021              Nursing Staff    Provider      Office Location      [facility]    Dating     LMP,  9 week US       [facility] Model    Gal[place]ius.Gant ] Traditional  [ ]  Centering  [ ]  Mom-Baby Dyad                Language     English    Anatomy US      Nl 19 weeks      Flu Vaccine      04/25/2022    Genetic/Carrier Screen     NIPS:   LR female  AFP:   *Screen Negative*  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     Allergies:   Bee venom and Ciprofloxacin    Social History   Socioeconomic History   Marital status: Single    Spouse name: Not on file   Number of children: Not on file   Years  of education: Not on file   Highest education level: Some college, no degree  Occupational History   Not on file  Tobacco Use   Smoking status: Never   Smokeless tobacco: Never  Vaping Use   Vaping status: Never Used  Substance and Sexual Activity   Alcohol use: Not Currently    Alcohol/week: 2.0 standard drinks of alcohol    Types: 1 Glasses of wine, 1 Cans of beer per week    Comment: weekly   Drug use: Not Currently    Types: Marijuana   Sexual activity: Not Currently    Partners: Male    Birth control/protection: None  Other Topics Concern   Not on file  Social History Narrative   Not on file   Social Drivers of Health   Financial Resource Strain: Medium Risk (03/03/2024)   Overall Financial Resource Strain (CARDIA)    Difficulty of Paying Living Expenses: Somewhat hard  Food Insecurity: No Food Insecurity (04/21/2024)   Hunger Vital Sign    Worried About Running Out of Food in the Last Year: Never true    Ran Out of Food in the  Last Year: Never true  Transportation Needs: No Transportation Needs (04/21/2024)   PRAPARE - Administrator, Civil Service (Medical): No    Lack of Transportation (Non-Medical): No  Physical Activity: Insufficiently Active (03/03/2024)   Exercise Vital Sign    Days of Exercise per Week: 2 days    Minutes of Exercise per Session: 30 min  Stress: Stress Concern Present (03/03/2024)   [employer] of Occupational Health - Occupational Stress Questionnaire    Feeling of Stress : To some extent  Social Connections: Moderately Integrated (03/03/2024)   Social Connection and Isolation Panel    Frequency of Communication with Friends and Family: More than three times a week    Frequency of Social Gatherings with Friends and Family: Once a week    Attends Religious Services: More than 4 times per year    Active Member of [employer] or Organizations: Yes    Attends Engineer, structural: More than 4 times per year    Marital Status: Never married     Family History: The patient's family history includes Breast cancer in her maternal aunt; Diabetes in her father, maternal grandfather, and mother; Heart disease in her mother; Hypertension in her maternal grandfather, maternal grandmother, and mother; Stroke in her maternal grandmother.  ROS:   Please see the history of present illness.     All other systems reviewed and are negative.  EKGs/Labs/Other Studies Reviewed:    The following studies were reviewed today:   EKG:   04/29/2024: Normal sinus rhythm, poor R wave progression, rate 63, QTc 454 08/09/2024: Normal sinus rhythm, rate 68, poor R wave progression, QTc 418  Recent Labs: 03/03/2024: TSH 0.89 07/21/2024: ALT 16; Magnesium  1.9 07/22/2024: BUN 8; Creatinine, Ser 0.87; Hemoglobin 15.5; Platelets 317; Potassium 3.8; Sodium 136  Recent Lipid Panel    Component Value Date/Time   CHOL 212 (H) 03/03/2024 1407   TRIG 89.0 03/03/2024 1407   HDL 56.60 03/03/2024 1407    CHOLHDL 4 03/03/2024 1407   VLDL 17.8 03/03/2024 1407   LDLCALC 138 (H) 03/03/2024 1407    Physical Exam:    VS:  BP 112/80   Pulse 79   Ht 5' (1.524 m)   Wt 191 lb (86.6 kg)   LMP 07/11/2024 (Approximate)   SpO2 100%   BMI 37.30 kg/m  Wt Readings from Last 3 Encounters:  08/09/24 191 lb (86.6 kg)  07/20/24 189 lb 13.1 oz (86.1 kg)  07/17/24 200 lb (90.7 kg)     GEN:  Well nourished, well developed in no acute distress HEENT: Normal NECK: No JVD; No carotid bruits LYMPHATICS: No lymphadenopathy CARDIAC: RRR, no murmurs, rubs, gallops RESPIRATORY:  Clear to auscultation without rales, wheezing or rhonchi  ABDOMEN: Soft, non-tender, non-distended MUSCULOSKELETAL:  No edema; No deformity  SKIN: Warm and dry NEUROLOGIC:  Alert and oriented x 3 PSYCHIATRIC:  Normal affect   ASSESSMENT:    1. Acute viral myocarditis   2. QT prolongation     PLAN:    Acute myocarditis:She was admitted 03/2024 with nausea/vomiting, found to have elevated troponin to 2000.  Echocardiogram showed EF 50 to 55% but with regional wall motion abnormalities.  LHC showed normal coronary arteries.  Cardiac MRI showed acute myocarditis with regional elevations in myocardial T1/2 as well as nonischemic LGE pattern, LVEF 45%, RVEF 53%.  Echocardiogram 08/05/2024 showed EF 55 to 60%, normal RV function, no significant valvular disease.  - She was started on Toprol -XL 25 mg daily but developed significant lightheadedness, has been discontinued with resolution of symptoms.  Soft BP limits GDMT, though given recovery of systolic function no GDMT needed at this time  QT prolongation: QTc 530 on EKG during hospitalization.  QTc at f/u appointment shows improvement, QTc down to 454.  Her Zoloft  was held on discharge from hospital but with resolution of QT prolongation has restarted.  EKG in clinic today shows normal Qtc (418)  RTC in 1 year  Medication Adjustments/Labs and Tests Ordered: Current medicines are  reviewed at length with the patient today.  Concerns regarding medicines are outlined above.  Orders Placed This Encounter  Procedures   EKG 12-Lead   No orders of the defined types were placed in this encounter.   Patient Instructions  Medication Instructions:  Your physician recommends that you continue on your current medications as directed. Please refer to the Current Medication list given to you today.  *If you need a refill on your cardiac medications before your next appointment, please call your pharmacy*  Lab Work: none If you have labs (blood work) drawn today and your tests are completely normal, you will receive your results only by: MyChart Message (if you have MyChart) OR A paper copy in the mail If you have any lab test that is abnormal or we need to change your treatment, we will call you to review the results.  Testing/Procedures: none  Follow-Up: At [facility], you and your health needs are our priority.  As part of our continuing mission to provide you with exceptional heart care, our providers are all part of one team.  This team includes your primary Cardiologist (physician) and Advanced Practice Providers or APPs (Physician Assistants and Nurse Practitioners) who all work together to provide you with the care you need, when you need it.  Your next appointment:   1 year  Provider:   Dr. Kate  We recommend signing up for the patient portal called MyChart.  Sign up information is provided on this After Visit Summary.  MyChart is used to connect with patients for Virtual Visits (Telemedicine).  Patients are able to view lab/test results, encounter notes, upcoming appointments, etc.  Non-urgent messages can be sent to your provider as well.   To learn more about what you can do with MyChart, go to [email or web address].   Other Instructions  None       Signed, Lonni LITTIE Nanas, MD  08/09/2024 11:11 AM    [facility]  HeartCare

## 2024-08-08 ENCOUNTER — Ambulatory Visit: Payer: Self-pay | Admitting: Cardiology

## 2024-08-09 ENCOUNTER — Ambulatory Visit: Attending: Cardiology | Admitting: Cardiology

## 2024-08-09 ENCOUNTER — Encounter: Payer: Self-pay | Admitting: Cardiology

## 2024-08-09 VITALS — BP 112/80 | HR 79 | Ht 60.0 in | Wt 191.0 lb

## 2024-08-09 DIAGNOSIS — R9431 Abnormal electrocardiogram [ECG] [EKG]: Secondary | ICD-10-CM | POA: Insufficient documentation

## 2024-08-09 DIAGNOSIS — B3322 Viral myocarditis: Secondary | ICD-10-CM | POA: Diagnosis not present

## 2024-08-09 NOTE — Patient Instructions (Signed)
 Medication Instructions:  Your physician recommends that you continue on your current medications as directed. Please refer to the Current Medication list given to you today.  *If you need a refill on your cardiac medications before your next appointment, please call your pharmacy*  Lab Work: none If you have labs (blood work) drawn today and your tests are completely normal, you will receive your results only by: MyChart Message (if you have MyChart) OR A paper copy in the mail If you have any lab test that is abnormal or we need to change your treatment, we will call you to review the results.  Testing/Procedures: none  Follow-Up: At [facility], you and your health needs are our priority.  As part of our continuing mission to provide you with exceptional heart care, our providers are all part of one team.  This team includes your primary Cardiologist (physician) and Advanced Practice Providers or APPs (Physician Assistants and Nurse Practitioners) who all work together to provide you with the care you need, when you need it.  Your next appointment:   1 year  Provider:   Dr. Kate  We recommend signing up for the patient portal called MyChart.  Sign up information is provided on this After Visit Summary.  MyChart is used to connect with patients for Virtual Visits (Telemedicine).  Patients are able to view lab/test results, encounter notes, upcoming appointments, etc.  Non-urgent messages can be sent to your provider as well.   To learn more about what you can do with MyChart, go to [email or web address].   Other Instructions None

## 2024-09-07 ENCOUNTER — Ambulatory Visit: Admitting: Gastroenterology

## 2024-10-03 ENCOUNTER — Ambulatory Visit (INDEPENDENT_AMBULATORY_CARE_PROVIDER_SITE_OTHER): Admitting: Student in an Organized Health Care Education/Training Program

## 2024-10-03 ENCOUNTER — Encounter: Payer: Self-pay | Admitting: Student in an Organized Health Care Education/Training Program

## 2024-10-03 VITALS — BP 122/73 | HR 69 | Wt 203.0 lb

## 2024-10-03 DIAGNOSIS — R1116 Cannabis hyperemesis syndrome: Secondary | ICD-10-CM | POA: Insufficient documentation

## 2024-10-03 DIAGNOSIS — F321 Major depressive disorder, single episode, moderate: Secondary | ICD-10-CM

## 2024-10-03 DIAGNOSIS — N920 Excessive and frequent menstruation with regular cycle: Secondary | ICD-10-CM

## 2024-10-03 DIAGNOSIS — K5000 Crohn's disease of small intestine without complications: Secondary | ICD-10-CM | POA: Diagnosis not present

## 2024-10-03 MED ORDER — NORETHINDRONE ACET-ETHINYL EST 1-20 MG-MCG PO TABS: 1.0000 | ORAL_TABLET | Freq: Every day | ORAL | Status: AC

## 2024-10-03 NOTE — Patient Instructions (Signed)
  VISIT SUMMARY: During today's visit, we discussed your mood and medication management, particularly focusing on your antidepressant medication, sertraline . We also reviewed your progress with Crohn's disease, your recent colonoscopy and endoscopy results, and your history of myocarditis. Additionally, we talked about your successful cessation of marijuana use and your son's progress with feeding therapy.  YOUR PLAN: -DEPRESSION: Depression is a mood disorder characterized by persistent feelings of sadness and loss of interest. Your depression is improving, but you experienced withdrawal symptoms when stopping sertraline  abruptly. We will taper your sertraline  to 25 mg daily for one week and then discontinue it. If needed, we can prescribe 25 mg tablets. Please monitor your mood and restart sertraline  if symptoms recur.  -CROHN'S DISEASE: Crohn's disease is a chronic inflammatory condition of the gastrointestinal tract. Your Crohn's disease is currently in remission with no active symptoms or recent flare-ups.  -MENORRHAGIA: Menorrhagia is heavy menstrual bleeding. You should continue your current hormonal contraception regimen to manage this condition.  -HISTORY OF MYOCARDITIS: Myocarditis is inflammation of the heart muscle. Your myocarditis has resolved, and a recent EKG is normal. You have not reported any symptoms. Please follow up with a cardiologist in a month.  INSTRUCTIONS: Please follow up with a cardiologist in a month to monitor your heart health. If you experience any recurrence of depression symptoms, restart sertraline  and contact our office. Continue your current hormonal contraception regimen for menorrhagia. Monitor your Crohn's disease symptoms and report any changes.

## 2024-10-03 NOTE — Progress Notes (Signed)
 Established Patient Office Visit  Patient ID: Frances Mccormick, female    DOB: 11/06/1986  Age: 38 y.o. MRN: [id number] PCP: Jerrell Cleatus Ned, MD  Chief Complaint  Patient presents with   Medical Management of Chronic Issues    6 month follow up     Subjective:     HPI  Discussed the use of AI scribe software for clinical note transcription with the patient, who gave verbal consent to proceed.  History of Present Illness Frances Mccormick is a 38 year old female with Crohn's disease who presents for follow-up regarding her mood and medication management.  She no longer feels the need for her antidepressant medication, sertraline , as her anxiety has improved. She has been on sertraline  for six months and experienced withdrawal symptoms, including headaches and 'brain zaps,' when she attempted to stop it abruptly.  She has successfully quit using marijuana, which she had been using to self-medicate, and has been abstinent for 78 days. She found support through an [employer] [employer], which helped her quit. She did not experience significant withdrawal symptoms from marijuana, but noted the challenge of changing her routine and social environment.  She underwent a colonoscopy and endoscopy, which did not reveal any issues related to her Crohn's disease. She has not had any Crohn's flare-ups or symptoms such as blood in her stools since her last hospital visit.  Her son has graduated from feeding therapy, which has positively impacted her mood. He is 17 months old, still has a tracheostomy, but eats orally and only uses his G-tube for medication administration. 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  She has a history of myocarditis, which resolved after follow-up. She has not experienced any chest pain or breathing difficulties since then. Her current weight is 203 pounds; in May, her weight was 198 pounds.      Objective:     BP 122/73   Pulse 69   Wt 203 lb (92.1 kg)   BMI 39.65 kg/m   Physical Exam  Gen: Well-appearing young woman Neck: Normal thyroid , no nodules or adenopathy Heart: Regular, 2 out of 6 early stock murmur at the left upper sternal border Lungs: Unlabored, clear throughout Ext: Warm, no edema Psych: Appropriate mood and affect, not anxious or depressed.     Assessment & Plan:   Problem List Items Addressed This Visit       High   Crohn's disease (HCC) (Chronic)   Managed by Dr. Rollin.  Currently quiescent.  Not using immunosuppressants.  Recent colonoscopy was reassuring.  Will continue to manage supportively.      Depression, major, single episode, moderate (HCC) - Primary (Chronic)   Depression is improving, but withdrawal symptoms are noted with abrupt cessation of sertraline . Taper sertraline  to 25 mg daily for one week, then discontinue. Monitor mood and restart sertraline  if symptoms recur.      Cannabis hyperemesis syndrome   Major recurring issue last summer leading to a hospitalization in August.  She has done very very well since quitting marijuana products.  No further nausea, eating well, gained 5 pounds.  Has good support system and has made nice lifestyle changes.        Medium    Menorrhagia (Chronic)   Chronic and stable.  Doing well on Loestrin birth control.       Return in about 6 months (around 04/02/2025).    Cleatus Ned Jerrell, MD [facility]  HealthCare at [employer]

## 2024-10-03 NOTE — Assessment & Plan Note (Signed)
 Chronic and stable.  Doing well on Loestrin birth control.

## 2024-10-03 NOTE — Assessment & Plan Note (Signed)
 Major recurring issue last summer leading to a hospitalization in August.  She has done very very well since quitting marijuana products.  No further nausea, eating well, gained 5 pounds.  Has good support system and has made nice lifestyle changes.

## 2024-10-03 NOTE — Assessment & Plan Note (Signed)
 Managed by Dr. Rollin.  Currently quiescent.  Not using immunosuppressants.  Recent colonoscopy was reassuring.  Will continue to manage supportively.

## 2024-10-03 NOTE — Assessment & Plan Note (Signed)
 Depression is improving, but withdrawal symptoms are noted with abrupt cessation of sertraline . Taper sertraline  to 25 mg daily for one week, then discontinue. Monitor mood and restart sertraline  if symptoms recur.

## 2024-10-24 ENCOUNTER — Telehealth: Payer: Self-pay

## 2024-10-24 NOTE — Telephone Encounter (Signed)
Sent to PCP as FYI. 

## 2024-10-25 ENCOUNTER — Ambulatory Visit (INDEPENDENT_AMBULATORY_CARE_PROVIDER_SITE_OTHER): Admitting: Student in an Organized Health Care Education/Training Program

## 2024-10-25 ENCOUNTER — Encounter: Payer: Self-pay | Admitting: Student in an Organized Health Care Education/Training Program

## 2024-10-25 VITALS — BP 110/63 | HR 62 | Ht 60.0 in | Wt 204.4 lb

## 2024-10-25 DIAGNOSIS — Z0184 Encounter for antibody response examination: Secondary | ICD-10-CM

## 2024-10-25 DIAGNOSIS — Z0000 Encounter for general adult medical examination without abnormal findings: Secondary | ICD-10-CM | POA: Diagnosis not present

## 2024-10-25 DIAGNOSIS — Z111 Encounter for screening for respiratory tuberculosis: Secondary | ICD-10-CM | POA: Diagnosis not present

## 2024-10-25 NOTE — Progress Notes (Signed)
 Complete physical exam  Patient: Frances Mccormick    DOB: Jul 29, 1986 38 y.o.   MRN: [id number]  Chief Complaint  Patient presents with   Acute Visit    Patient needs forms filled out for job. She also needs a TB test done.     Subjective:    Frances Mccormick is a 38 y.o. female who presents today for a complete physical exam. She reports consuming a general diet. She generally feels well. She reports sleeping well. She does not have additional problems to discuss today.   Discussed the use of AI scribe software for clinical note transcription with the patient, who gave verbal consent to proceed.  History of Present Illness Frances Mccormick is a 38 year old female who presents for a job-related health screening and immunization verification.  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 She needs a form filled out and is due for a TB screening and possibly an MMR booster.  Her immunization records do not show her MMR vaccination, although she believes she received it during school. Her tetanus and hepatitis B vaccinations are current. 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  No history of communicable diseases or any issues that would impair her ability to perform her job. She reports her stomach is stable and has no issues with vision, hearing, heart, lungs, lifting, or carrying, provided she has her glasses.  She has not traveled outside the [place]  and has no known exposure to tuberculosis.  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   Most recent fall risk assessment:    10/03/2024   11:12 AM  Fall Risk   Falls in the past year? 0  Number falls in past yr: 0  Injury with Fall? 0  Risk for fall due to : No Fall  Risks  Follow up Falls evaluation completed     Most recent depression screenings:    10/03/2024   11:12 AM 03/31/2024    2:07 PM  PHQ 2/9 Scores  PHQ - 2 Score 0 0  PHQ- 9 Score 1  4      Data saved with a previous flowsheet row definition    Patient Care Team: Jerrell Cleatus Ned, MD as PCP - General (Internal Medicine) Zina Jerilynn LABOR, MD as PCP - OBGYN (Obstetrics and Gynecology) Kate Lonni CROME, MD as PCP - Cardiology (Cardiology)   ROS    Objective:    BP 110/63 (BP Location: Right Arm, Patient Position: Sitting, Cuff Size: Large)   Pulse 62   Ht 5' (1.524 m)   Wt 204 lb 6.4 oz (92.7 kg)   SpO2 100%   BMI 39.92 kg/m   Physical Exam   Gen: Well-appearing woman Heart: Regular, no murmur Lungs: Unlabored, clear throughout Ext: Warm, well-perfused Psych: Appropriate mood and affect, not anxious or depressed appearing     Assessment & Plan:    Routine Health Maintenance and Physical Exam Immunization History  Administered Date(s) Administered   Hepatitis B 09/14/1997, 10/19/1997, 03/26/1998   Tdap 04/20/2015, 04/25/2022   Unspecified SARS-COV-2 Vaccination 03/29/2020, 04/25/2020    Health Maintenance  Topic Date Due   Pneumococcal Vaccine (1 of 2 - PCV) Never done   HPV VACCINES (  1 - 3-dose SCDM series) Never done   Influenza Vaccine  Never done   COVID-19 Vaccine (3 - 2025-26 season) 08/01/2024   Cervical Cancer Screening (HPV/Pap Cotest)  01/02/2027   DTaP/Tdap/Td (3 - Td or Tdap) 04/25/2032   Hepatitis B Vaccines 19-59 Average Risk  Completed   Hepatitis C Screening  Completed   HIV Screening  Completed   Meningococcal B Vaccine  Aged Out    Discussed health benefits of physical activity, and encouraged her to engage in regular exercise appropriate for her age and condition.  Problem List Items Addressed This Visit       Low   Health maintenance examination - Primary   She has no issues with vision, hearing, heart, lungs, lifting,  or carrying. There are no communicable diseases or impairments affecting job performance. Tetanus and hepatitis B vaccinations are up to date. Completed physical examination and discussed job requirements and health status.  She is up-to-date on cervical cancer screening.  Offered flu vaccination today, patient declined. Her MMR vaccination status is uncertain, and previous records are unavailable. Ordered MMR antibody titer to evaluate immunity status. If antibody levels are low, administer MMR booster. There is no known exposure to tuberculosis, and the previous skin test was negative. Ordered blood test for tuberculosis exposure.      Other Visit Diagnoses       Screening examination for pulmonary tuberculosis       Relevant Orders   QuantiFERON-TB Gold Plus     Immunity status testing       Relevant Orders   Measles/Mumps/Rubella Immunity          Cleatus Debby Specking, MD [facility] at [facility]

## 2024-10-25 NOTE — Assessment & Plan Note (Signed)
 She has no issues with vision, hearing, heart, lungs, lifting, or carrying. There are no communicable diseases or impairments affecting job performance. Tetanus and hepatitis B vaccinations are up to date. Completed physical examination and discussed job requirements and health status.  She is up-to-date on cervical cancer screening.  Offered flu vaccination today, patient declined. Her MMR vaccination status is uncertain, and previous records are unavailable. Ordered MMR antibody titer to evaluate immunity status. If antibody levels are low, administer MMR booster. There is no known exposure to tuberculosis, and the previous skin test was negative. Ordered blood test for tuberculosis exposure.

## 2024-10-25 NOTE — Patient Instructions (Signed)
  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 We discussed your health status, reviewed your immunization records, and performed a physical examination.  YOUR PLAN: -ADULT WELLNESS VISIT: You have no issues with vision, hearing, heart, lungs, lifting, or carrying. There are no communicable diseases or impairments affecting your job performance. Your tetanus and hepatitis B vaccinations are up to date. We completed your physical examination and discussed your job requirements and health status.  -IMMUNITY STATUS EVALUATION FOR MEASLES, MUMPS, RUBELLA (MMR): Your MMR vaccination status is uncertain, and previous records are unavailable. We have ordered an MMR antibody titer to evaluate your immunity status. If the antibody levels are low, we will administer an MMR booster. This test will help us  determine if you are protected against measles, mumps, and rubella.  -SCREENING FOR TUBERCULOSIS EXPOSURE: There is no known exposure to tuberculosis, and your previous skin test was negative. We have ordered a blood test for tuberculosis exposure for convenience and accuracy. This test will help us  ensure that you do not have tuberculosis.  INSTRUCTIONS: Please follow up with us  once your MMR antibody titer and tuberculosis blood test results are available. If the MMR antibody levels are low, you will need to receive an MMR booster.

## 2024-10-27 LAB — MEASLES/MUMPS/RUBELLA IMMUNITY
Mumps IgG: 22.1 [AU]/ml
Rubella: <0.9 {index} — ABNORMAL LOW
Rubeola IgG: 64.5 [AU]/ml

## 2024-10-27 LAB — QUANTIFERON-TB GOLD PLUS
Mitogen-NIL: 7.42 [IU]/mL
NIL: 0.04 [IU]/mL
QuantiFERON-TB Gold Plus: NEGATIVE
TB1-NIL: 0.04 [IU]/mL
TB2-NIL: 0.05 [IU]/mL

## 2024-10-31 ENCOUNTER — Ambulatory Visit: Payer: Self-pay | Admitting: Student in an Organized Health Care Education/Training Program

## 2024-10-31 DIAGNOSIS — F12188 Cannabis abuse with other cannabis-induced disorder: Secondary | ICD-10-CM | POA: Diagnosis not present

## 2024-11-01 ENCOUNTER — Ambulatory Visit

## 2024-11-01 DIAGNOSIS — Z23 Encounter for immunization: Secondary | ICD-10-CM | POA: Diagnosis not present

## 2024-11-01 NOTE — Progress Notes (Signed)
 Frances Mccormick is a 38 y.o. female presents in office today for a nurse visit for MMR Immunization.   Patient Injection was given in the  Left deltoid. Patient tolerated injection well.   Patient's next injection due n/a, appt made? not applicable  [employer]

## 2025-04-03 ENCOUNTER — Ambulatory Visit: Admitting: Student in an Organized Health Care Education/Training Program
